# Patient Record
Sex: Male | Born: 1945
Health system: Southern US, Community
[De-identification: ages and names within clinical notes are randomized; demographics above are authoritative.]

## PROBLEM LIST (undated history)

## (undated) DIAGNOSIS — E871 Hypo-osmolality and hyponatremia: Secondary | ICD-10-CM

## (undated) DIAGNOSIS — IMO0001 Reserved for inherently not codable concepts without codable children: Secondary | ICD-10-CM

## (undated) DIAGNOSIS — E785 Hyperlipidemia, unspecified: Secondary | ICD-10-CM

## (undated) DIAGNOSIS — I10 Essential (primary) hypertension: Secondary | ICD-10-CM

## (undated) DIAGNOSIS — I2510 Atherosclerotic heart disease of native coronary artery without angina pectoris: Secondary | ICD-10-CM

## (undated) DIAGNOSIS — Z87442 Personal history of urinary calculi: Secondary | ICD-10-CM

## (undated) DIAGNOSIS — I672 Cerebral atherosclerosis: Secondary | ICD-10-CM

## (undated) DIAGNOSIS — I701 Atherosclerosis of renal artery: Secondary | ICD-10-CM

## (undated) DIAGNOSIS — R011 Cardiac murmur, unspecified: Secondary | ICD-10-CM

## (undated) DIAGNOSIS — J342 Deviated nasal septum: Secondary | ICD-10-CM

## (undated) DIAGNOSIS — M79661 Pain in right lower leg: Secondary | ICD-10-CM

## (undated) DIAGNOSIS — Z9289 Personal history of other medical treatment: Secondary | ICD-10-CM

## (undated) DIAGNOSIS — I739 Peripheral vascular disease, unspecified: Secondary | ICD-10-CM

## (undated) DIAGNOSIS — M79662 Pain in left lower leg: Secondary | ICD-10-CM

## (undated) DIAGNOSIS — K219 Gastro-esophageal reflux disease without esophagitis: Secondary | ICD-10-CM

## (undated) DIAGNOSIS — M1990 Unspecified osteoarthritis, unspecified site: Secondary | ICD-10-CM

## (undated) HISTORY — DX: Personal history of other medical treatment: Z92.89

## (undated) HISTORY — DX: Atherosclerosis of renal artery: I70.1

## (undated) HISTORY — PX: CYSTOSCOPY W/ STONE MANIPULATION: SHX1427

## (undated) HISTORY — DX: Reserved for inherently not codable concepts without codable children: IMO0001

## (undated) HISTORY — PX: TONSILLECTOMY: SUR1361

## (undated) HISTORY — PX: LITHOTRIPSY: SUR834

## (undated) HISTORY — DX: Peripheral vascular disease, unspecified: I73.9

## (undated) HISTORY — PX: APPENDECTOMY: SHX54

## (undated) HISTORY — DX: Pain in left lower leg: M79.662

## (undated) HISTORY — DX: Cerebral atherosclerosis: I67.2

## (undated) HISTORY — DX: Pain in right lower leg: M79.661

## (undated) HISTORY — DX: Hyperlipidemia, unspecified: E78.5

## (undated) HISTORY — PX: CAROTID ENDARTERECTOMY: SUR193

---

## 2001-11-24 ENCOUNTER — Ambulatory Visit (HOSPITAL_BASED_OUTPATIENT_CLINIC_OR_DEPARTMENT_OTHER): Admission: RE | Admit: 2001-11-24 | Discharge: 2001-11-24 | Payer: Self-pay | Admitting: *Deleted

## 2003-05-24 ENCOUNTER — Encounter: Payer: Self-pay | Admitting: General Practice

## 2003-05-24 ENCOUNTER — Ambulatory Visit (HOSPITAL_COMMUNITY): Admission: RE | Admit: 2003-05-24 | Discharge: 2003-05-24 | Payer: Self-pay | Admitting: General Practice

## 2005-08-02 DIAGNOSIS — Z9289 Personal history of other medical treatment: Secondary | ICD-10-CM

## 2005-08-02 HISTORY — DX: Personal history of other medical treatment: Z92.89

## 2005-08-16 ENCOUNTER — Ambulatory Visit (HOSPITAL_COMMUNITY): Admission: RE | Admit: 2005-08-16 | Discharge: 2005-08-16 | Payer: Self-pay | Admitting: Cardiovascular Disease

## 2005-10-23 IMAGING — CR DG CHEST 2V
2 series · 2 of 2 positions shown · non-contrast
Comparison: None.

CLINICAL DATA: Pre-admit for carotid stenosis. 
 CHEST - 2 VIEW:

[view not recorded (1 of 2)]
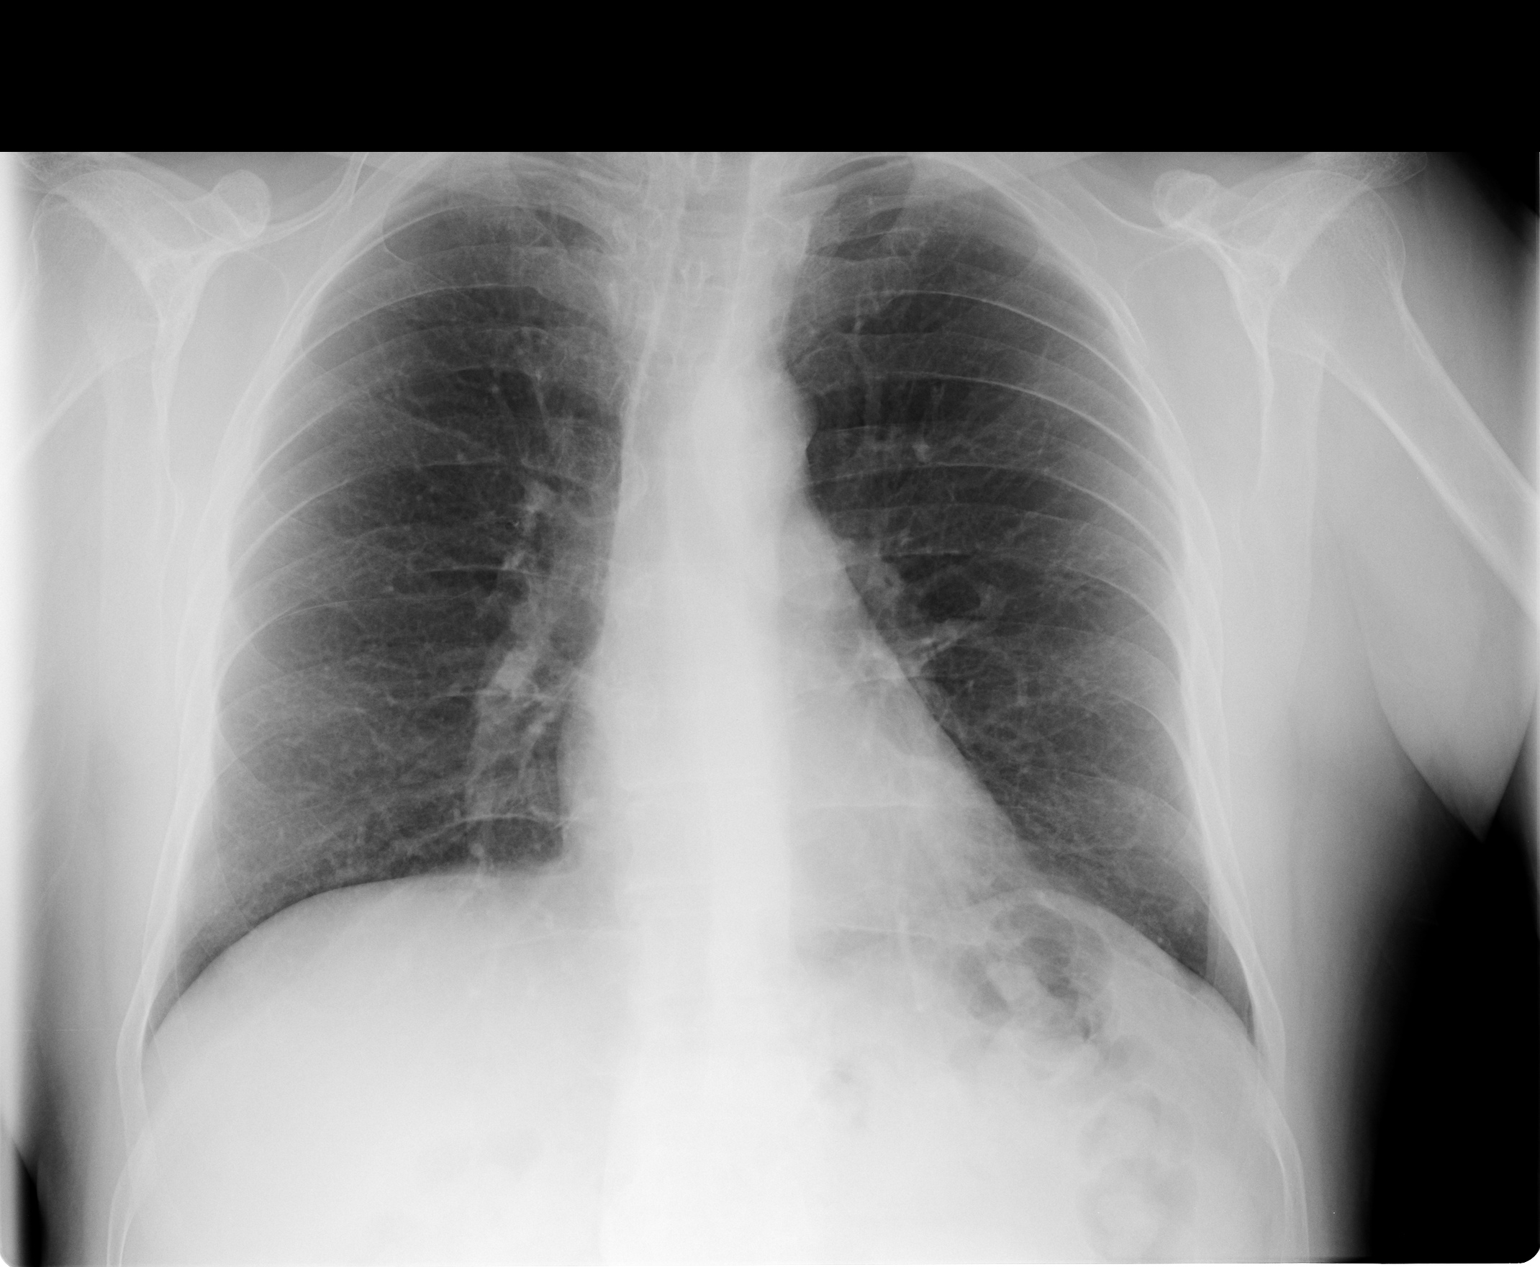

[view not recorded (2 of 2)]
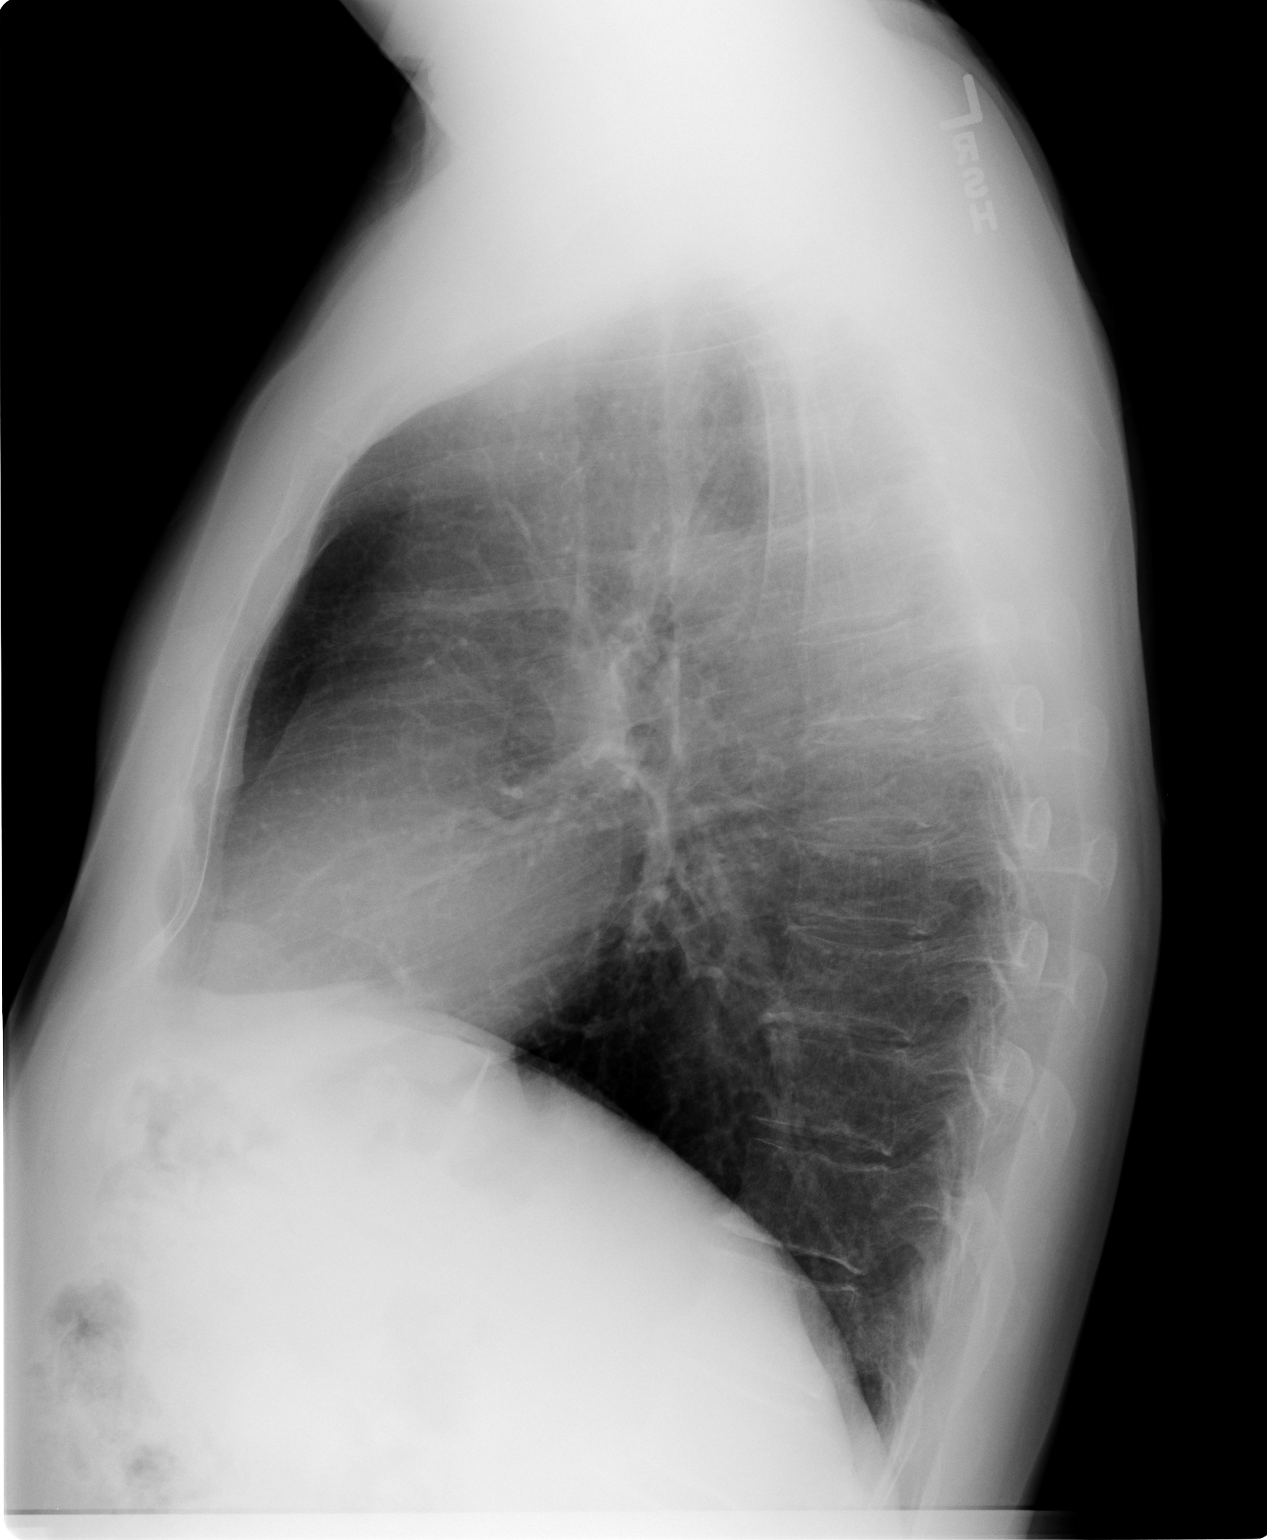

[2 of 2 positions shown; findings below may reference images not displayed]

FINDINGS: Heart and vascularity normal. Lung markings mildly accentuated and there may be mild hyperaeration.  No acute airspace disease.  Osseous structures intact.
IMPRESSION: There are mild chronic lung changes ? no acute process.

## 2005-10-25 ENCOUNTER — Encounter (INDEPENDENT_AMBULATORY_CARE_PROVIDER_SITE_OTHER): Payer: Self-pay | Admitting: *Deleted

## 2005-10-25 ENCOUNTER — Ambulatory Visit (HOSPITAL_COMMUNITY): Admission: RE | Admit: 2005-10-25 | Discharge: 2005-10-26 | Payer: Self-pay | Admitting: *Deleted

## 2005-12-21 IMAGING — CR DG CHEST 2V
2 series · 2 of 2 positions shown · non-contrast
Comparison: [DATE].

CLINICAL DATA: The patient is to undergo endarterectomy.  
 CHEST - 2 VIEW ? [DATE]:

[view not recorded (1 of 2)]
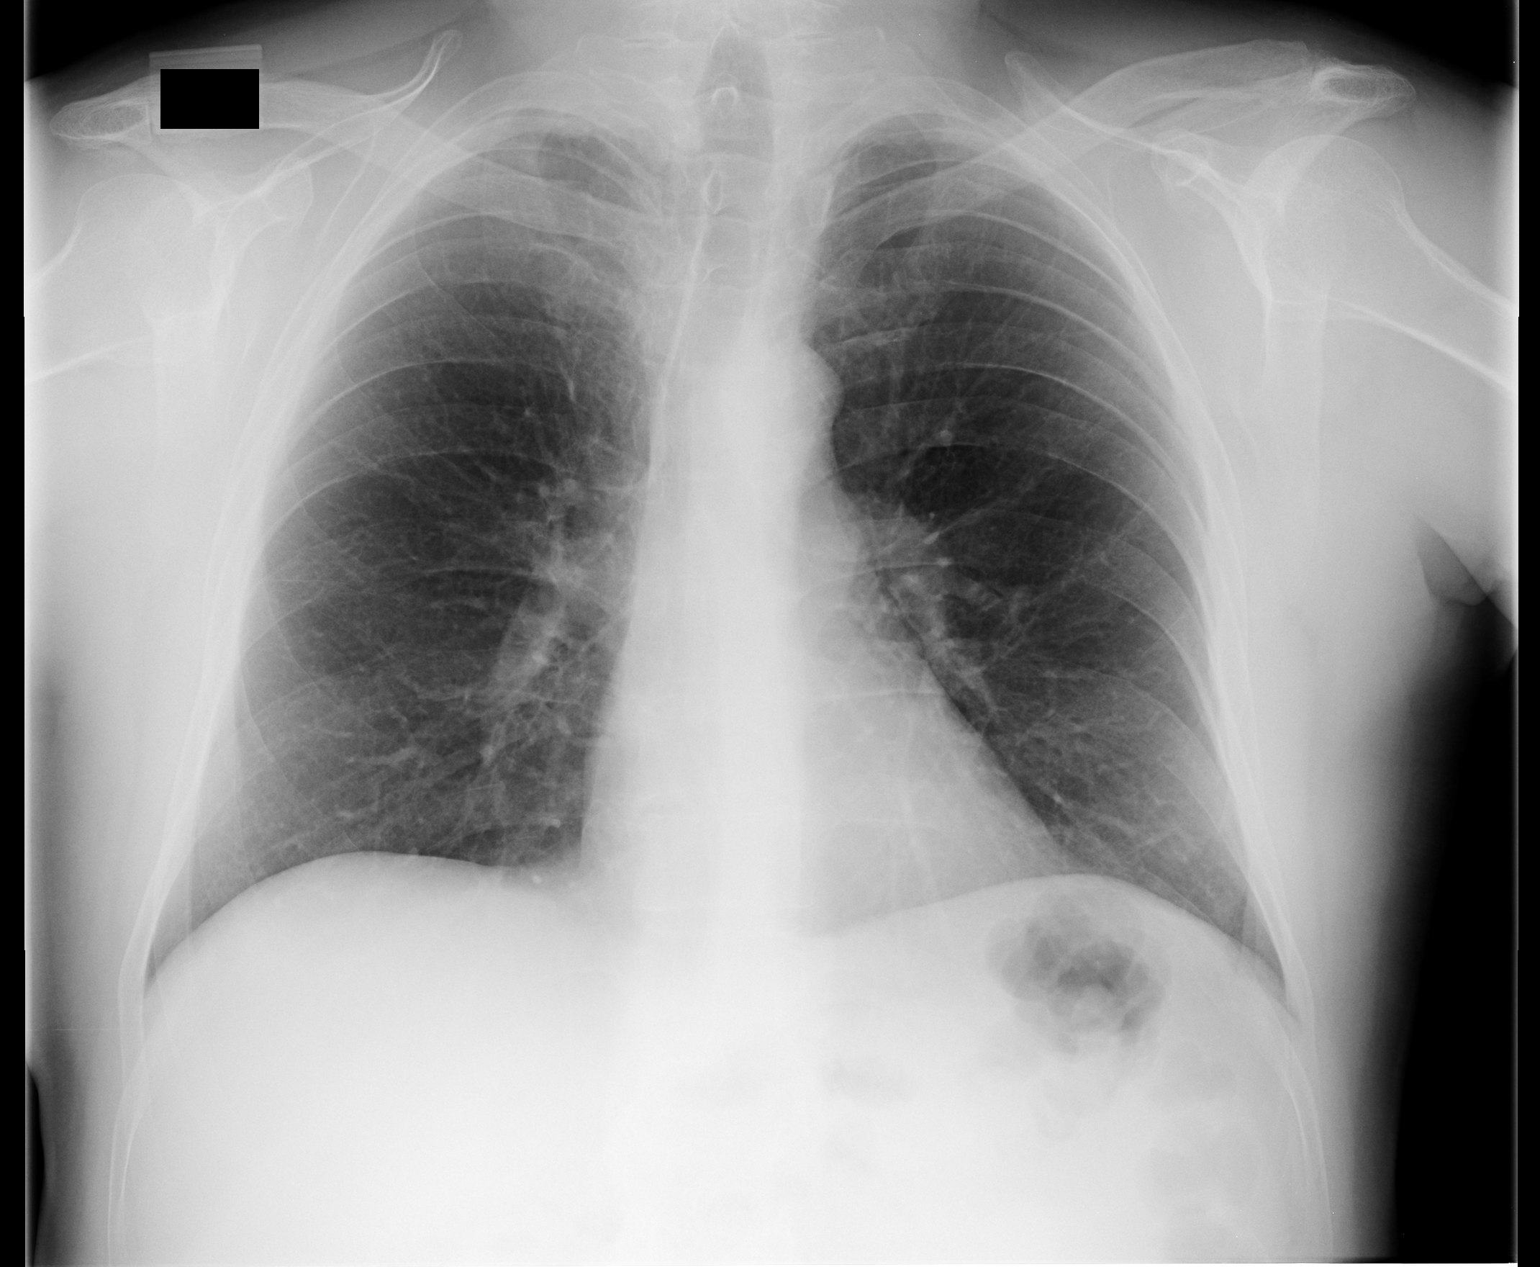

[view not recorded (2 of 2)]
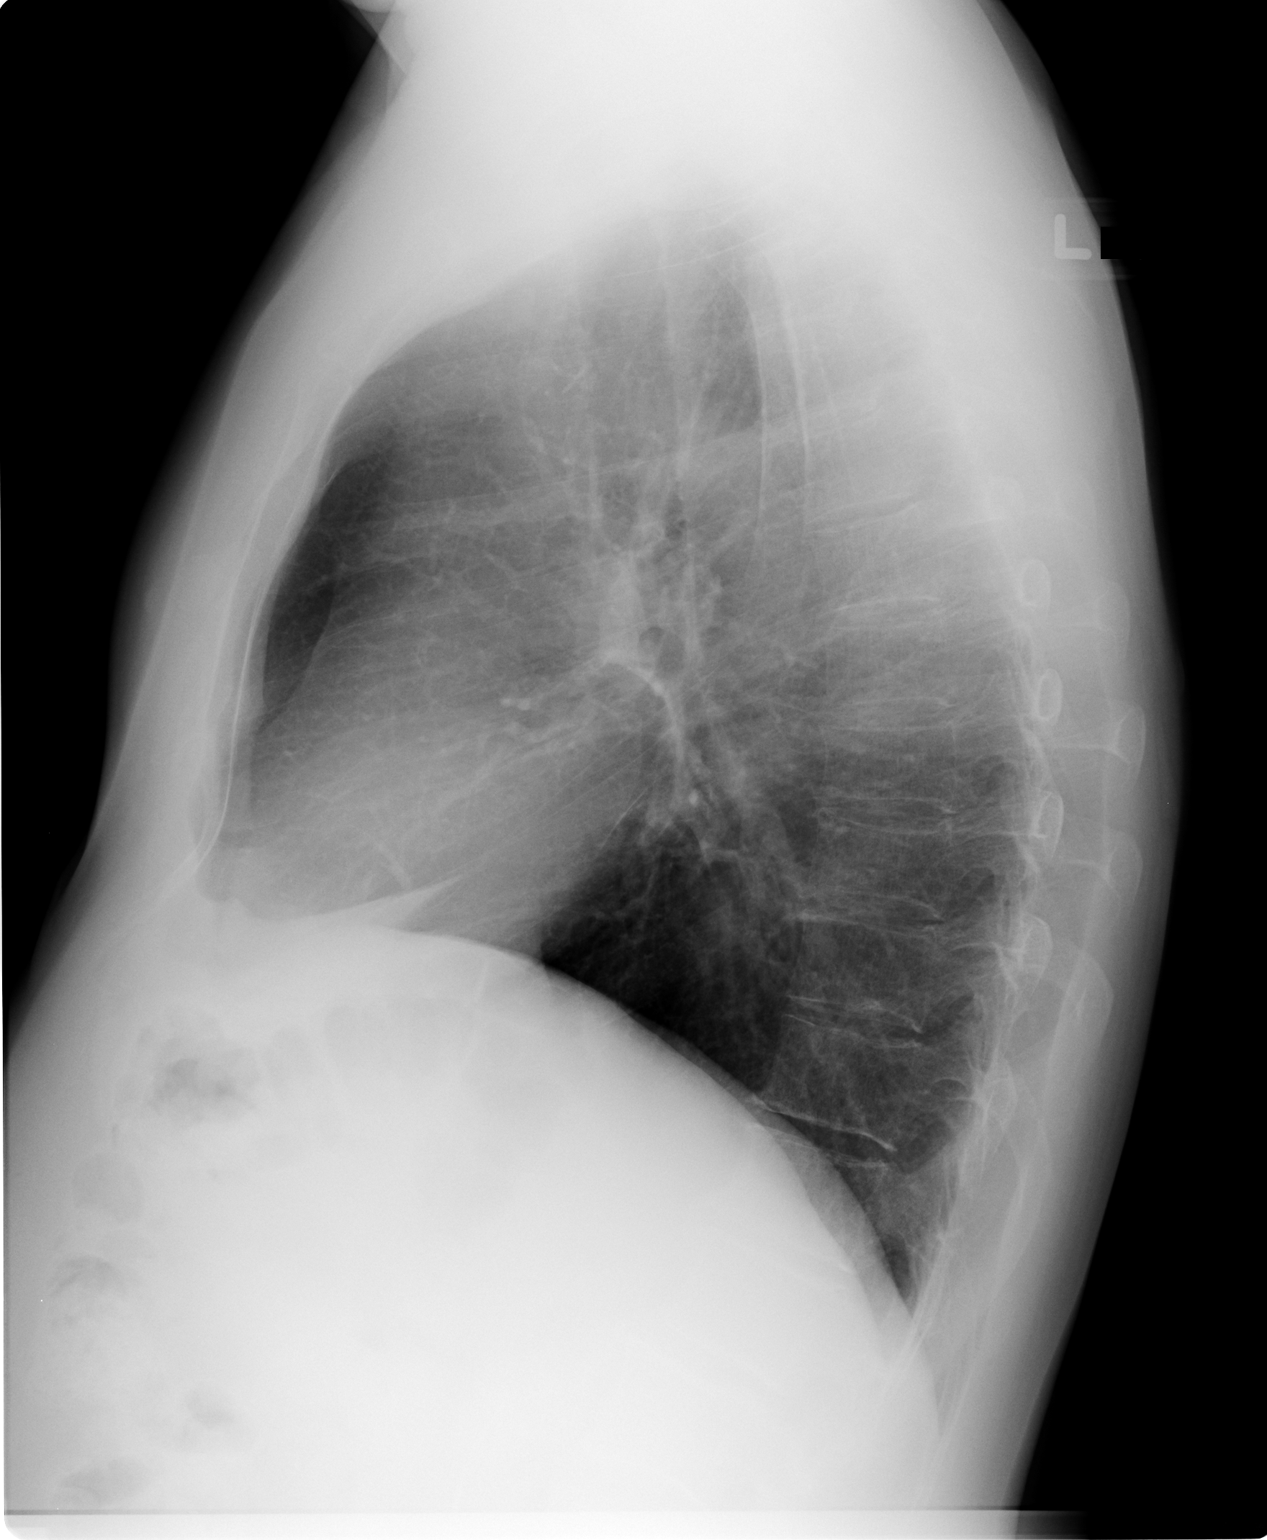

[2 of 2 positions shown; findings below may reference images not displayed]

The lungs are clear.  Cardiac and mediastinal contours are normal. Degenerative changes in the mid thoracic spine.
IMPRESSION: Negative for acute cardiac or pulmonary process.

## 2005-12-25 ENCOUNTER — Encounter (INDEPENDENT_AMBULATORY_CARE_PROVIDER_SITE_OTHER): Payer: Self-pay | Admitting: *Deleted

## 2005-12-25 ENCOUNTER — Ambulatory Visit (HOSPITAL_COMMUNITY): Admission: RE | Admit: 2005-12-25 | Discharge: 2005-12-26 | Payer: Self-pay | Admitting: *Deleted

## 2008-10-19 ENCOUNTER — Encounter: Admission: RE | Admit: 2008-10-19 | Discharge: 2008-10-19 | Payer: Self-pay | Admitting: Cardiovascular Disease

## 2008-10-19 IMAGING — CR DG CHEST 2V
2 series · 2 of 2 positions shown · non-contrast
Comparison: Chest x-ray of [DATE]

CLINICAL DATA: Peripheral vascular disease, pre-procedure

CHEST - 2 VIEW

[w chest pa]
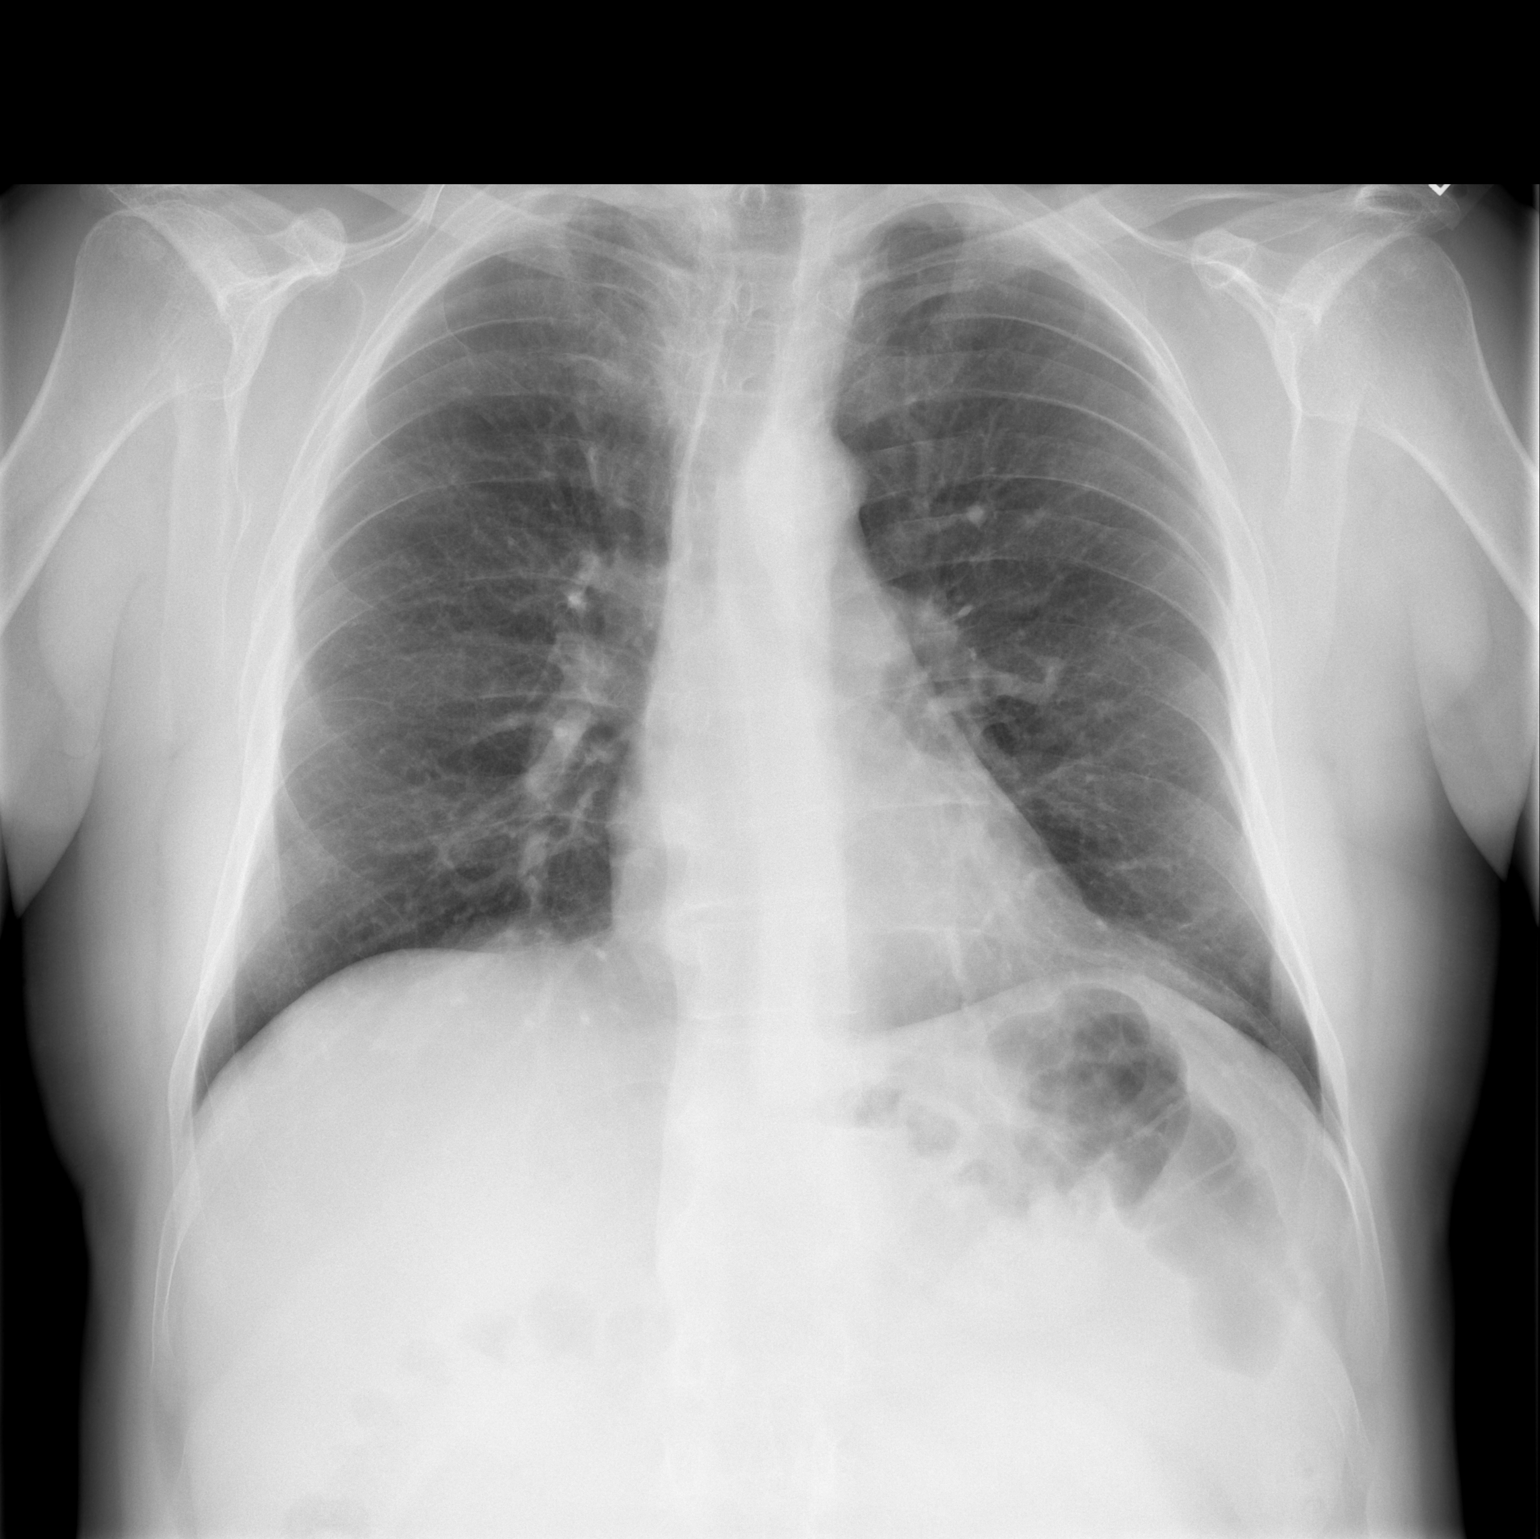

[w chest lat]
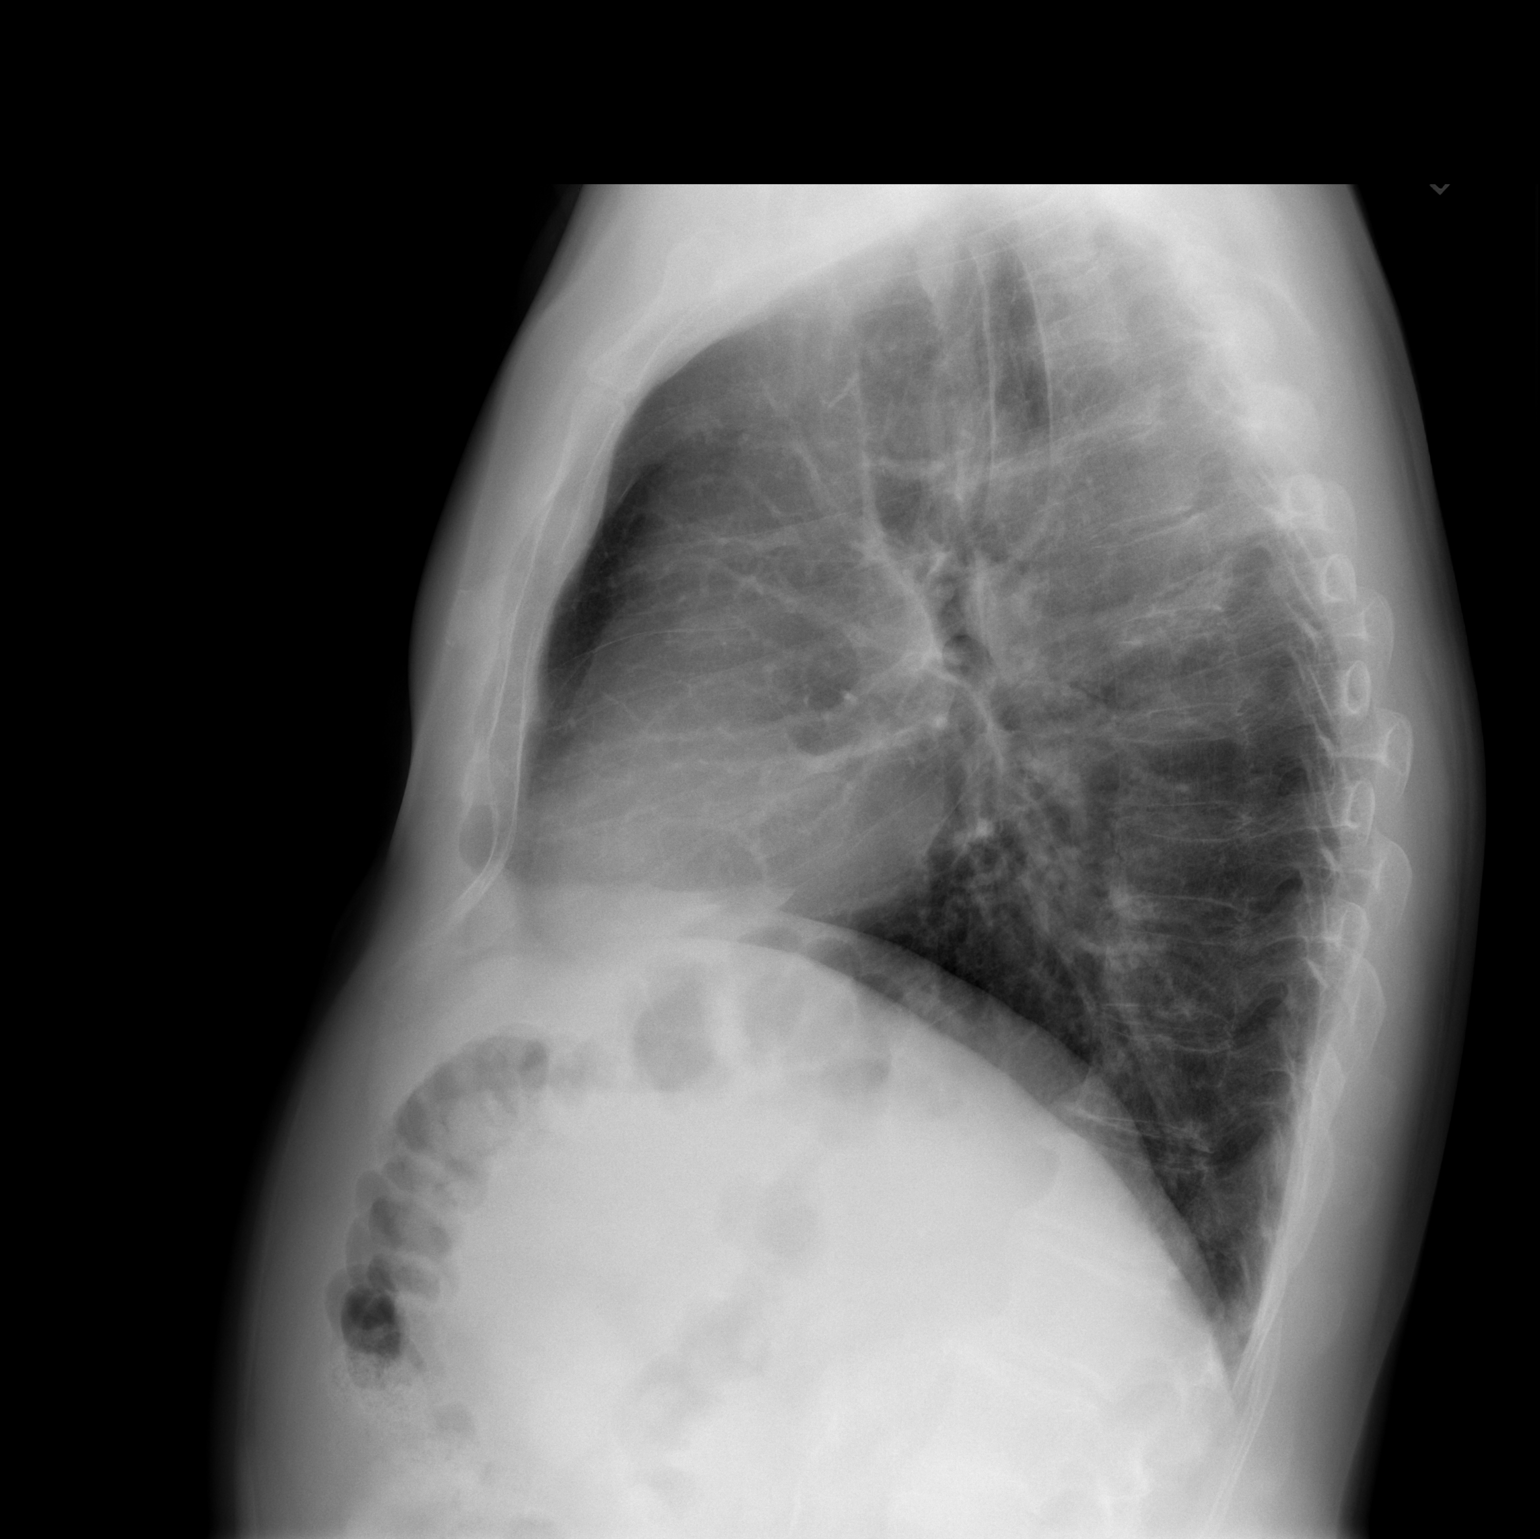

[2 of 2 positions shown; findings below may reference images not displayed]

FINDINGS: No active infiltrate or effusion is seen.  There is mild
peribronchial thickening present. The heart is within normal limits
in size. Mild degenerative change is noted in the thoracic spine.
IMPRESSION: No active lung disease.

## 2008-10-25 ENCOUNTER — Observation Stay (HOSPITAL_COMMUNITY): Admission: RE | Admit: 2008-10-25 | Discharge: 2008-10-26 | Payer: Self-pay | Admitting: Cardiovascular Disease

## 2008-10-25 DIAGNOSIS — I739 Peripheral vascular disease, unspecified: Secondary | ICD-10-CM

## 2008-10-25 HISTORY — DX: Peripheral vascular disease, unspecified: I73.9

## 2008-12-28 ENCOUNTER — Inpatient Hospital Stay (HOSPITAL_COMMUNITY): Admission: RE | Admit: 2008-12-28 | Discharge: 2008-12-29 | Payer: Self-pay | Admitting: Cardiovascular Disease

## 2011-03-26 LAB — CBC
HCT: 35.6 % — ABNORMAL LOW (ref 39.0–52.0)
Hemoglobin: 12 g/dL — ABNORMAL LOW (ref 13.0–17.0)
MCHC: 33.6 g/dL (ref 30.0–36.0)
MCV: 88.9 fL (ref 78.0–100.0)
Platelets: 241 10*3/uL (ref 150–400)
RBC: 4 MIL/uL — ABNORMAL LOW (ref 4.22–5.81)
RDW: 12.9 % (ref 11.5–15.5)
WBC: 9.5 10*3/uL (ref 4.0–10.5)

## 2011-03-26 LAB — BASIC METABOLIC PANEL
BUN: 19 mg/dL (ref 6–23)
CO2: 26 mEq/L (ref 19–32)
Calcium: 10.3 mg/dL (ref 8.4–10.5)
Chloride: 98 mEq/L (ref 96–112)
Creatinine, Ser: 1.45 mg/dL (ref 0.4–1.5)
GFR calc Af Amer: 59 mL/min — ABNORMAL LOW (ref >60)
GFR calc non Af Amer: 49 mL/min — ABNORMAL LOW (ref >60)
Glucose, Bld: 110 mg/dL — ABNORMAL HIGH (ref 70–99)
Potassium: 4.2 mEq/L (ref 3.5–5.1)
Sodium: 133 mEq/L — ABNORMAL LOW (ref 135–145)

## 2011-04-24 NOTE — Cardiovascular Report (Signed)
NAMEPRATT, BRESS NO.:  [id number]   MEDICAL RECORD NO.:  [id number]          PATIENT TYPE:  INP   LOCATION:  6524                         FACILITY:  [facility]   PHYSICIAN:  [name], M.D.   DATE OF BIRTH:  [birth year]   DATE OF PROCEDURE:  DATE OF DISCHARGE:                            CARDIAC CATHETERIZATION   Joshua Wyatt is a 65 year old mildly overweight married white male with history  of PAD status post bilateral carotid endarterectomies performed by Dr.  Drucie Opitz in January 2007 after an angiogram that I performed in  September 2006 revealed high-grade bilateral ICA stenosis.  At that  time, I also found a 60-70% left renal artery stenosis and moderate left  iliac disease.  The patient's other problems include ongoing tobacco  abuse with an occasional cigar, hypertension, and hyperlipidemia.  He  had negative Myoview in July with Dopplers performed in September  revealed widely patent carotids, high-grade bilateral renal artery  stenosis, high-grade iliac disease with an ABIs in the 0.85 range.  He  does have functional limiting claudication of his hip, buttock, and  calves.  He presents now for angiography and potential intervention.   PROCEDURE DESCRIPTION:  The patient was brought to the second floor of  [facility] in the postabsorptive state.  He was  premedicated with p.o. Valium, IV Versed and fentanyl.  His right groin  was prepped and shaved in the usual sterile fashion.  A 1% Xylocaine was  used for local anesthesia.  A 5-French sheath was inserted into the  right femoral artery using standard Seldinger technique.  A 5-French  tennis racket catheter was used for midstream and distal abdominal  aortography with bifemoral runoff using digital subtraction bolus chase  step-table technique.  A 5-French short right Judkins catheter was used  for selective right and left renal artery angiography.  Visipaque dye  was used through  the entirety of the case.  Retrograde aortic pressures  were monitored throughout the case.   ANGIOGRAPHIC RESULTS:  1. Abdominal aorta.      a.     Renal arteries - 80% right renal artery stenosis with an       eccentric plaque and 100 mm gradient on pullback.      b.     A 70% left renal artery stenosis with a 20 mm gradient.      c.     Moderate infrarenal and abdominal aortic atherosclerotic       narrowing.  2. Left lower extremity.      a.     A 30% left common iliac artery stenosis.      b.     An 80% distal left SFA stenosis in the Hunter canal with 3-       vessel runoff.  3. Right lower extremity.      a.     A 30% proximal right common iliac artery stenosis with a 20       mm gradient after administration of 200 mcg of intra-arterial       nitroglycerin.  b.     An 80% focal right SFA stenosis in the [place] canal with 3-       vessel runoff.   PROCEDURE DESCRIPTION:  The patient received 3000 units of heparin  intravenously.  Contralateral access was obtained with a 6-French  terminal crossover sheath.  A 0.035 Wholey wire was used to cross the  lesion.  A 3000 units of heparin was administered.  PTA was performed  with 4 x 4 Cordis Powerflex, stenting with a 6 x 6 Bard nitinol self-  expanding stent and post dilatation with a 5 x 4 Bard Rival angioplasty  balloon at 3 atmospheres resulting in reduction of an 80% distal left  SFA stenosis to 0% residual with excellent runoff.  The patient  tolerated the procedure well.  The guidewire and catheter were removed.  The sheath was removed after an ACT was measured.  Pressure was applied  on the groin to achieve hemostasis.  The patient left the lab in stable  condition.  I will gently hydrate him, discharge him in the morning,  treat with aspirin and Plavix, and will get followup Dopplers  and ABIs at which time he will see me back in the office in followup.  Because his blood pressure was under good control, his renal  function  was normal.  Intervention was not performed in the renal arteries.  This  will be followed closely by outpatient duplex.      [name], M.D.  Electronically Signed     JB/MEDQ  D:  10/25/2008  T:  10/25/2008  Job:  [id number]   cc:   Second Floor [place] [facility]  [facility]

## 2011-04-24 NOTE — Op Note (Signed)
NAMETRAI, ELLS NO.:  [id number]   MEDICAL RECORD NO.:  [id number]          PATIENT TYPE:  INP   LOCATION:  2030                         FACILITY:  [facility]   PHYSICIAN:  [name], M.D.   DATE OF BIRTH:  [birth year]   DATE OF PROCEDURE:  12/28/2008  DATE OF DISCHARGE:                               OPERATIVE REPORT   Joshua Wyatt is a 65 year old married white male with history of PVOD.  He  has had bilateral carotid endarterectomies after an angiogram from  September 2006 revealed high-grade disease.  He also has bilateral  iliac, cerebellar, renal and SFA disease.  I stented his left SFA on  November 16, resulting in improvement in his symptoms and ABIs.  He has  residual distal right SFA stenosis with claudication of that side.  Renal Dopplers showed a right renal aortic ratio of 7.8.  He presents  now for peripheral angiography intervention of his right renal and right  SFA.   DESCRIPTION OF PROCEDURE:  The patient was brought to the [facility] in the postabsorptive state.  He was  premedicated with p.o. Valium, IV fentanyl and Versed.  His left groin  was prepped and shaved in the usual sterile fashion.  Xylocaine 1% was  used for local anesthesia.  A 5-French sheath was inserted into the left  femoral artery using standard Seldinger technique.  A 5-French pigtail  catheter was used for abdominal aortography.  Visipaque dye was used for  the entirety of the case.  Retrograde aortic pressure was monitored  during the case.   ANGIOGRAPHIC RESULTS:  1. Abdominal aorta.      a.     Renal arteries, 90% right and 80% left renal artery       stenosis.      b.     Infrarenal abdominal aorta, moderate atherosclerotic       changes.      c.     Iliac bifurcation.  There was a 30-40% ostial right common       iliac artery stenosis.   Contralateral access was obtained with a short crossover catheter and an  0.035 Wholey  wire along with a 7-French Terumo crossover sheath.  The  patient received 3000 units of heparin intravenously.  Visipaque dye was  used for the entirety of the case.  Retrograde aortic pressure was  monitored during the case.  The [facility] wire was passed across the distal  right SFA stenosis and predilatation was performed with a 4.2 Powerflex.  Stenting was then performed with a 6 x 4 Bard Life nitinol self-  expanding stent and post dilatation was performed with a 6.2 long  Powerflex balloon resulting reduction of 90% distal right SFA stenosis  to 0% residual.   The crossover sheath was then withdrawn back across the bifurcation.  A  short 7-French sheath was exchanged.  Using a 6-French short JR-4 guide  catheter along with an 0.014 x 190 stabilizer wire, the right renal  artery was selected, angiogram performed, and the stenosis crossed.  Stenting was performed with a 6 x 15 Genesis on Aviator balloon stent  premount at 8 atmospheres resulting reduction of 90% stenosis to 0%  residual.  The patient tolerated the procedure well.  The guidewire and  catheter were removed.  The sheath was removed after an ACT was  measured less than 200.  Pressure was held in the groin to achieve  hemostasis.  The patient left the lab in stable condition.  He will be  gently hydrated overnight and discharged home in the morning.  He will  get followup Dopplers and ABIs.  We will see him back in the office in 2-  3 weeks for followup.      [name], M.D.  Electronically Signed     JB/MEDQ  D:  12/28/2008  T:  12/28/2008  Job:  [id number]   cc:   Second Floor [facility]  [facility]

## 2011-04-24 NOTE — Discharge Summary (Signed)
NAMELEMONT, SITZMANN NO.:  [id number]   MEDICAL RECORD NO.:  [id number]          PATIENT TYPE:  INP   LOCATION:  3212                         FACILITY:  [facility]   PHYSICIAN:  [name], M.D.   DATE OF BIRTH:  04-22-1946   DATE OF ADMISSION:  10/25/2008  DATE OF DISCHARGE:  10/26/2008                               DISCHARGE SUMMARY   DISCHARGE DIAGNOSES:  1. Claudication with abnormal ABIs.  2. Status post elective pulmonary vein angiogram revealing      a.     Bilateral renal artery stenoses 80% on the left, 70% on the       right.      b.     Bilateral superficial femoral artery disease.      c.     Status post intervention of his left superficial femoral       artery with a 6 x 6 LifeStent reduced from 80% to 0.      d.     History of bilateral carotid disease with carotid       endarterectomies in the past.      e.     Moderate atherosclerosis of his abdominal aorta.  3. Hypertension.  4. Hyperlipidemia.  5. Statin intolerance last trial dose of simvastatin unable to      tolerate.  6. Tobacco abuse.  7. Gastroesophageal reflux disease.   LABORATORY DATA:  Hemoglobin 13, hematocrit 37.7, WBCs 8.9 and platelets  214.  Sodium 132, potassium 4.2, BUN 15, and creatinine 1.13.   DISCHARGE MEDICATIONS:  1. Aspirin 81 mg a day.  2. Diltiazem XR 240 mg everyday.  3. Avalide 300/12.5 mg everyday.  4. Prilosec over-the-counter.  5. Plavix 75 mg once a day.   HOSPITAL COURSE:  Mr. Leavell was admitted on elective basis secondary to  lifestyle-limiting claudication and abnormal ABIs for elective PV  angiogram.  He underwent an angiogram by Dr. [name] on October 25, 2008.  Findings as above.  He underwent procedure with a stent to  his left SFA.  Post procedure, he did well.  The morning of October 26, 2008, he was seen by Dr. [name].  Vital signs were stable and  labs  were stable.  Blood pressure was 131/56, pulse was 54.  He was  considered stable for discharge home.  Other discharge instructions will  be he will have lower extremity Dopplers and kidney Dopplers on November 11, 2008, at 9:30.  He will follow up with Dr. Gwenlyn Found on November 18, 2008, at 11:30.      Cyndia Bent, N.P.      [name], M.D.  Electronically Signed    BB/MEDQ  D:  10/26/2008  T:  10/26/2008  Job:  [id number]

## 2011-04-24 NOTE — Discharge Summary (Signed)
NAMEMICAIAH, Joshua Wyatt NO.:  [id number]   MEDICAL RECORD NO.:  [id number]          PATIENT TYPE:  INP   LOCATION:  2030                         FACILITY:  [facility]   PHYSICIAN:  [name], M.D.   DATE OF BIRTH:  Jul 11, 1946   DATE OF ADMISSION:  12/28/2008  DATE OF DISCHARGE:  12/29/2008                               DISCHARGE SUMMARY   DISCHARGE DIAGNOSES:  1. Claudication, lifestyle-limiting of the right leg.  2. Atherosclerotic peripheral vascular disease with known 80% right      superficial femoral artery stenosis.      a.     Percutaneous transluminal angioplasty and stent deployment       to the right superficial femoral artery.      b.     Right renal artery stenosis of 90% as well as left renal       artery stenosis of 80% undergoing percutaneous transluminal       angioplasty and stent deployment shows right renal artery.  3. Hypertension.  4. Hyperlipidemia.   DISCHARGE CONDITION:  Improved.   PROCEDURES:  December 28, 2008, percutaneous transluminal angioplasty and  stent deployment with pulmonary vein angiogram to the right superficial  femoral artery.   December 28, 2008, with same pulmonary vein angiogram, the patient  underwent percutaneous transluminal angioplasty and stent deployment to  the right renal artery reducing 90% stenosis to 0%.   DISCHARGE MEDICATIONS:  1. Diltiazem XR 240 mg daily.  2. Avalide 300/12.5 one daily.  3. Prilosec OTC 20 mg daily.  4. Zocor 20 mg nightly.  5. Plavix 75 mg daily.  6. Aspirin 81 mg daily.   DISCHARGE INSTRUCTIONS:  1. Low-sodium heart-healthy diet.  Wash cath site with soap and water.      Call if any bleeding, swelling, or drainage.  Increase activity      slowly.  2. No lifting for 2 days.  No driving for 2 days.  Follow up with Dr.      Gwenlyn Found, Wednesday, January 12, 2009, at 11:30 a.m.  3. Have lab work done on Monday, January 03, 2009, to check basic      metabolic panel.  4. We will  schedule for lower extremity arterial and renal Dopplers on      January 06, 2009, at 1 p.m. at Dr. Kennon Holter office.   HISTORY OF PRESENT ILLNESS:  A 65 year old white married male with  history of PV angio and renal intervention with stenting of the left SFA  in November 2009.  He is also noted to have bilateral renal artery  stenosis with 80% right and 70% left on previous cath.  He was seen by  Dr. Gwenlyn Found in December 2009 and decision was made to undergo stenting to  the right renal artery and right SFA.  He has had claudication with  walking, unable to walk for exercise.  He had been walking one mile a  day prior to increasing claudication.   PAST MEDICAL HISTORY:  1. Peripheral vascular disease as stated.  2. Reflux disease.  3. Hyperlipidemia.  4. Hypertension.  5. He had a nuclear study, Myoview was negative for ischemia.  6. He has normal LV function by a 2D echo with normal valves.   ALLERGIES:  STATIN intolerance due to myalgias, but he is tolerating the  20 of Zocor.   Family history, social history, and review of systems, see H and P.   PHYSICAL EXAMINATION ON DISCHARGE:  Per Dr. Gwenlyn Found, blood pressure  134/80, pulse 62.  Left groin stable.  No hematoma and 2+ right pedal  pulse.  Creatinine was slightly elevated and I will follow up as an  outpatient.   LABORATORY DATA:  Hemoglobin 12 on the day of discharge, hematocrit  35.6, WBC 9.5, platelets 241.  Sodium 133, potassium 4.2, chloride 98,  CO2 of 26, BUN 19, creatinine 1.45, glucose 110, and calcium 10.3.  The  patient was stable after procedure, ambulated without problems, and was  felt was ready for discharge home.  He will follow up as an outpatient  after labs and renal Dopplers.      Otilio Carpen. Dorene Ar, N.P.      [name], M.D.  Electronically Signed    LRI/MEDQ  D:  12/29/2008  T:  12/29/2008  Job:  [id number]

## 2011-04-27 NOTE — H&P (Signed)
NAMESHARRON, SIMPSON                 ACCOUNT NO.:  [id number]   MEDICAL RECORD NO.:  [id number]          PATIENT TYPE:  AMB   LOCATION:  SDS                          FACILITY:  [facility]   PHYSICIAN:  Dorothea Glassman, M.D.    DATE OF BIRTH:  25-Mar-1946   DATE OF ADMISSION:  12/25/2005  DATE OF DISCHARGE:                                HISTORY & PHYSICAL   CARDIOLOGIST:  [name], M.D.   CHIEF COMPLAINT:  Right carotid artery stenosis.   HISTORY OF PRESENT ILLNESS:  This is a 65 year old Caucasian male with a  past medical history of extracranial cerebrovascular occlusive disease,  status post left carotid endarterectomy and heavy tobacco abuse.  The  patient was referred to Dr. Dorothea Glassman by Dr. Gwenlyn Found after a diagnostic  cerebral arteriography which revealed bilateral internal carotid artery  stenosis.  There is no history of stroke or transient ischemic attack.  The  patient denied any sensory, motor or visual changes, as well as speech or  swallowing problems.  Dr. Amedeo Plenty saw the patient on October 15, 2005, after  reviewing his arteriography and recommended that he undergo staged bilateral  carotid endarterectomies for a reduction of stroke.  The patient is right-  handed and therefore Dr. Amedeo Plenty recommended  the __________ carotid  endarterectomy first.  The patient had undergone a left carotid  endarterectomy with Dacron patch angioplasty on October 25, 2005.   The patient still remains asymptomatic.  He denies any headaches, nausea or  vomiting, vertigo, seizures, syncope, dysphagia, memory loss or TIA/CVA  symptoms.  The patient does continue to smoke a few cigarettes daily but is  trying to quit.   PAST MEDICAL HISTORY:  1.  Extracranial cerebrovascular occlusive disease with severe bilateral      internal carotid artery stenosis, status post left carotid      endarterectomy.  2.  Hypertension.  3.  Hyperlipidemia.  4.  History of nephrolithiasis.  5.  History of skin  cancer, status removal on October 22, 2005.  6.  History of tobacco abuse.   PAST SURGICAL HISTORY:  1.  Left carotid endarterectomy with Dacron patch angioplasty on October 25, 2005.  2.  T&A.   ALLERGIES:  No known drug allergies.   MEDICATIONS:  1.  Aspirin 325 mg p.o. daily.  2.  Prilosec 20 mg p.o. daily.  3.  Avapro 150 mg p.o. daily.   REVIEW OF SYSTEMS:  See the HPI for pertinent positives and negatives,  though is negative for stroke, syncope and diabetes mellitus.   SOCIAL HISTORY:  The patient is married, without any children.  He lives  with his wife.  The patient is currently smoking and trying to quit.  The  patient denies any alcohol use.  The patient is self-employed as a  Games developer.  He does drive.   FAMILY HISTORY:  Mother is still living, in her 40's.  She has a history of  a CVA and Alzheimer's disease.  The patient's father is deceased at age 40,  due to coronary artery  disease, status post coronary artery bypass graft  surgery.  The patient has a brother with a past medical history of  hypertension.   PHYSICAL EXAMINATION:  VITAL SIGNS:  Blood pressure 160/100.  The blood  pressure was reassessed in five minutes and decreased to 148/98.  Heart rate  90, respirations 16.  GENERAL:  This is a 65 year old Caucasian male, in no acute distress.  HEENT:  Normocephalic and atraumatic.  Pupils equal, round, reactive to  light and accommodation.  Extraocular movements intact.  Oral mucosa is pink  and moist.  Sclerae anicteric.  NECK:  Supple.  There is a noticeable right carotid bruit.  No left carotid  bruit heard.  Left carotid incision healed well.  LUNGS:  Respirations symmetrical on inspiration.  Clear to auscultation  bilaterally.  HEART:  A regular rate and rhythm.  No murmur, gallop or rub.  ABDOMEN:  Soft, nontender, non-distended.  Normoactive bowel sounds x4.  GENITOURINARY:  Deferred.  RECTAL:  Deferred.  EXTREMITIES:  No edema.  Pulses 2+  throughout.  NEUROLOGIC:  Nonfocal.  Alert and oriented x4.  A steady gait.  Muscle  strength 5+ throughout.  Cranial nerves are grossly intact.  Deep tendon  reflexes 2+ and symmetrical.   ASSESSMENT:  1.  Extracranial cerebrovascular occlusive disease with bilateral internal      carotid artery stenosis, status post left carotid endarterectomy.  2.  Hyperlipidemia.  3.  History of tobacco abuse.   PLAN:  1.  Admit the patient to [facility] on December 25, 2005,      under Dr. Drucie Opitz' service.  2.  The patient will undergo a __________ carotid endarterectomy.  3.  Dr. Amedeo Plenty has seen and evaluated the patient prior to admission and      explained the risks and benefits of the procedure in great detail.  The      patient has agreed to continue.      Richardson Dopp, PA      P. Drucie Opitz, M.D.  Electronically Signed    JMW/MEDQ  D:  12/21/2005  T:  12/22/2005  Job:  [id number]   cc:   [name], M.D.  Fax: [phone]

## 2011-04-27 NOTE — Cardiovascular Report (Signed)
NAMEJARAE, PANAS NO.:  [id number]   MEDICAL RECORD NO.:  [id number]          PATIENT TYPE:  AMB   LOCATION:  SDS                          FACILITY:  [facility]   PHYSICIAN:  [name], M.D.   DATE OF BIRTH:  09-25-1946   DATE OF PROCEDURE:  08/16/2005  DATE OF DISCHARGE:                              CARDIAC CATHETERIZATION   PROCEDURE:  Cerebral angiogram and abdominal aortogram.   Mr. [name] is a 65 year old married white male who is referred by Dr. Rosina Lowenstein  for evaluation of abnormal carotid duplex study with symptoms of dizziness.  Carotid duplex revealed high-grade bilateral ICA stenosis. The patient's  risk factor profile was positive for hypertension, hyperlipidemia, tobacco  abuse, and family history. He complained of exertional chest pain. A  Cardiolite stress test was entirely normal as was lower extremity Doppler  studies and 2-D echo. He presents now for cerebral angiography to define his  anatomy.   PROCEDURE DESCRIPTION:  The patient was brought to the 6th floor [place]  peripheral vascular angiographic suite in the __________ state. He was not  premedicated. His right groin was prepped and draped in the usual sterile  fashion. Then 1% Xylocaine was used for local anesthesia. A 5-french sheath  was inserted into the right femoral artery using standard Seldinger  technique. A 5-French long pigtail catheter was used for arch angiography  and distal abdominal aortography. A JV-1 catheter was used for four vessel  cerebral angiography, intra- and extracranial views. Visipaque was used  throughout the entire case. Retrograde aortic pressures were monitored  throughout the case.   ANGIOGRAPHIC RESULTS:  1.  Arch aortogram: Type I arch.  2.  A. Right vertebral: Normal intra- and extracranial anatomy.      1.  Right carotid 70% to 80% proximal ICA stenosis with normal intra-          and extracranial carotid anatomy otherwise.      2.  Left carotid  70% to 80% ulcerated appearing proximal left ICA          stenosis with normal intra- and extracranial anatomy.      3.  Left vertebral: Normal intra- and extracranial anatomy.   DISTAL ABDOMINAL AORTOGRAPHY:  Performed using 20 mL of Visipaque dye at 20  mL per second.  There was a proximal 60% to 70% left renal artery stenosis.  There was moderate atherosclerotic changes of the inferior abdominal aorta  and a 70% distal left common iliac artery stenosis.   IMPRESSION:  Mr. [name] has high-grade bilateral internal carotid artery  stenosis. He will be discharged home today as an outpatient and will see  back in the office next week. Will refer him to Dr. Drucie Opitz and Dr.  Antony Contras for further evaluation to determine whether he is a capture II carotid  stent candidate given high-grade symptomatic versus an endarterectomy  candidate.   Sheaths were removed and pressure held to groin to achieve hemostasis. The  patient left the lab in stable condition.      [name], M.D.  Electronically Signed  JB/MEDQ  D:  08/16/2005  T:  08/16/2005  Job:  [id number]   cc:   [facility]  [place], [place] [address]   Lajean Saver, M.D.

## 2011-04-27 NOTE — Op Note (Signed)
[place]  Patient:    Joshua Wyatt, Joshua Wyatt Visit Number: [id number] MRN: [id number]          Service Type: DSU Location: [facility] Attending Physician:  Carlisle Cater Dictated by:   Daryl Eastern, M.D. Proc. Date: 11/24/01 Admit Date:  11/24/2001                             Operative Report  PREOPERATIVE DIAGNOSIS:  Lacerated extensor tendon dorsum of the right hand to the right index finger.  POSTOPERATIVE DIAGNOSIS:  Lacerated extensor tendon dorsum of the right hand to the right index finger.  PROCEDURE:  Repair of extensor tendon right hand to the right index finger.  SURGEON:  Daryl Eastern, M.D.  ANESTHESIA:  Marcaine 0.5% local.  OPERATIVE FINDINGS:  The patient had a 90% oblique jagged laceration of the EDC of the right index finger.  It was lacerated just through the distal portion of the metacarpus.  DESCRIPTION OF PROCEDURE:  Under 0.50% Marcaine local anesthesia with a tourniquet on the right forearm, the right hand was prepped and draped in the usual fashion, and after elevating the limb the tourniquet was inflated to 280 mmHg.  The previous sutures were removed, and the laceration was extended proximally in a zigzag fashion.  Blunt dissection was carried down to the tendon which was 90% transected.  The wound edges were tidy up with the scissors on the original laceration.  Bleeding veins were controlled with 4-0 chromic ties.  The extensor tendon was then repaired with a 4-0 Mersilene suture in a Kessler fashion, reinforced with interrupted sutures.  The skin was then closed with 4-0 nylon sutures.  Sterile dressings were applied, followed by a volar plaster splint.  The tourniquet was released with good circulation to the hand.  The patient was discharged in good condition. Dictated by:   Daryl Eastern, M.D. Attending Physician:  Carlisle Cater DD:  11/24/01 TD:   11/24/01 Job: [id number]

## 2011-04-27 NOTE — Op Note (Signed)
NAMEESTEVEN, OVERFELT NO.:  [id number]   MEDICAL RECORD NO.:  [id number]          PATIENT TYPE:  INP   LOCATION:  3301                         FACILITY:  [place]   PHYSICIAN:  Dorothea Glassman, M.D.    DATE OF BIRTH:  02/28/1946   DATE OF PROCEDURE:  12/25/2005  DATE OF DISCHARGE:                                 OPERATIVE REPORT   SURGEON:  P Cameron Sprang, MD   ASSISTANT:  Doroteo Bradford, PA.   ANESTHETIC:  General endotracheal.   ANESTHESIOLOGIST:  Dr. Ola Spurr.   PREOPERATIVE DIAGNOSIS:  Severe right internal carotid artery stenosis.   POSTOPERATIVE DIAGNOSIS:  Severe right internal carotid artery stenosis.   PROCEDURE:  Right carotid endarterectomy, Dacron patch angioplasty.   CLINICAL NOTE:  [name] is a 65 year old male who has previously  undergone left carotid endarterectomy. He has known severe right internal  carotid artery stenosis. He is asymptomatic. Brought to the operating at  this time for elective right carotid endarterectomy. Risks of the operative  procedure explained to the patient in detail. Major morbidity and mortality  associated this procedure 1-2% to include but not limited to MI, CVA,  cranial nerve injury and death.   OPERATIVE PROCEDURE:  The patient brought to the operating room in stable  condition. Placed under general endotracheal anesthesia. The right neck  prepped and draped in sterile fashion. Foley catheter and arterial line in  place.   Curvilinear skin incision made along the anterior border of the right  sternomastoid muscle. Dissection carried down through subcutaneous tissue  with electrocautery. Platysma divided. Deep dissection carried along the  upper border of sternomastoid to expose the carotid sheath. The facial vein  ligated with 2-0 silk and divided. The common carotid artery mobilized down  the omohyoid muscle encircled with vessel loop. The vagus nerve reflected  posteriorly and preserved. The  origin of superior thyroid external carotid  were encircled with vessel loops. The internal carotid artery then followed  distally up to the posterior belly of the digastric muscle. Encircled  distally with vessel loop.  The hypoglossal nerve reflected superiorly and  preserved.   The carotid bifurcation revealed extensive plaque disease. There was a long  segment of space-occupying plaque extending into the internal carotid artery  approximately 3 cm.   The patient administered 7000 units heparin intravenously. Adequate  circulation time permitted. Vessels controlled with clamps. Longitudinal  arteriotomy made in the distal common carotid artery. The arteriotomy  extended across the carotid bulb and up into the internal carotid artery.  There was ulcerated plaque present in the right internal carotid artery with  an extensive plaque extending into the origin of the vessel. A high-grade  stenosis present. Shunt was inserted.   Using an endarterectomy elevator. The plaque was removed from the vessel.  The endarterectomy carried down in the common carotid artery with plaque was  divided transversely Potts scissors. Plaque raised up to the bulb and  superior thyroid and external carotid were endarterectomized using an  eversion technique. The distal internal carotid artery plaque then feathered  out.  Fragments of plaque removed with plaque forceps. Site irrigated with  heparin saline solution and Dextran.   A patch angioplasty of the endarterectomy site then carried out using  running 6-0 Prolene suture with a Finesse Dacron patch. At completion of the  patch angioplasty, shunt was removed. All vessels well flushed. Clamps  removed directing the initial antegrade flow up the external carotid artery,  following this internal carotid was released.   There is an excellent pulse and Doppler signal in the distal internal  carotid artery. Adequate hemostasis obtained. The patient administered  50  milligrams protamine intravenously.   The sternomastoid fascia was then closed with running 2-0 Vicryl suture.  Platysma closed with running 3-0 Vicryl suture. Skin closed with 4-0  Monocryl. Steri-Strips applied.   The patient tolerated procedure well. Transferred recovery in stable  condition. No apparent complications.      Dorothea Glassman, M.D.  Electronically Signed     PGH/MEDQ  D:  12/25/2005  T:  12/26/2005  Job:  [id number]   cc:   [name], M.D.  Fax: [phone]

## 2011-04-27 NOTE — Discharge Summary (Signed)
Joshua Wyatt, BOGDANSKI NO.:  [id number]   MEDICAL RECORD NO.:  [id number]          PATIENT TYPE:  INP   LOCATION:  3301                         FACILITY:  [place]   PHYSICIAN:  Dorothea Glassman, M.D.    DATE OF BIRTH:  10-25-1946   DATE OF ADMISSION:  12/25/2005  DATE OF DISCHARGE:  12/26/2005                                 DISCHARGE SUMMARY   HISTORY OF PRESENT ILLNESS:  This is a 65 year old Caucasian male with a  past medical history of extracranial cerebrovascular occlusive disease  status post recent left carotid endarterectomy and heavy tobacco abuse.  The  patient was referred to Dr. Amedeo Plenty by Dr. Gwenlyn Found after a diagnostic cerebral  arteriogram revealed bilateral internal carotid artery stenosis.  There is  no history of stroke or transient ischemic attack.  The patient has remained  asymptomatic following his procedure done in November 2006 on the left side.  The patient was admitted for the second carotid endarterectomy and the  planned staged procedures.   PAST MEDICAL HISTORY:  1.  Extracranial cerebrovascular occlusive disease with bilateral internal      carotid artery stenosis status post left carotid endarterectomy.  2.  Hypertension.  3.  Hyperlipidemia.  4.  History of nephrolithiasis.  5.  History of skin cancer.  6.  History of tobacco abuse.   PAST SURGICAL HISTORY:  1.  Left carotid endarterectomy October 25, 2005.  2.  Tonsillectomy and adenoidectomy.   ALLERGIES:  No known allergies.   MEDICATIONS PRIOR TO ADMISSION:  1.  Aspirin 325 mg daily.  2.  Prilosec 20 mg daily.  3.  Avapro 150 mg daily.   FAMILY HISTORY:  Please see history and physical done at the time of  admission.   SOCIAL HISTORY:  Please see history and physical done at the time of  admission.   REVIEW OF SYSTEMS:  Please see history and physical done at the time of  admission.   PHYSICAL EXAMINATION:  Please see history and physical done at the time of  admission.   HOSPITAL COURSE:  Patient was admitted electively on December 25, 2005, taken  to the operating room at which time he underwent a right carotid  endarterectomy with Dacron patch angioplasty.  Patient tolerated the  procedure well.  Was taken to the postanesthesia care unit in stable  condition.   POSTOPERATIVE HOSPITAL COURSE:  Patient has done quite well.  He has  remained hemodynamically stable.  All routine lines, monitors, and drainage  devices have been discontinued in the standard fashion.  Incision is healing  well without hematoma, bleeding, or evidence of infection.  He is  neurologically intact with no new findings postoperatively.  He has  tolerated a routine progression in activity commensurate for level of  postoperative convalescence using routine protocols.  His overall status is  felt to be stable for discharge on December 26, 2005.   CONDITION ON DISCHARGE:  Stable, improving.   DISCHARGE MEDICATIONS:  As preoperatively.  Additionally for pain, Tylox one  or two every four to six hours as needed.  INSTRUCTIONS:  The patient will receive written instructions in regard to  medications, activity, diet, wound care, and follow-up.  Follow-up will  include Dr. Amedeo Plenty in two weeks.  The office will contact him with an  appointment time and date.   FINAL DIAGNOSES:  Severe bilateral carotid disease as described above now  status post second (right carotid endarterectomy) in the planned staged  bilateral procedures.  Other diagnoses as previously listed per the history.   LABORATORIES:  Most recent hemoglobin and hematocrit postoperatively were 10  and 29, respectively.  Electrolytes were pending at time of discharge  dictation.      John Giovanni, P.A.-C.      Dorothea Glassman, M.D.  Electronically Signed    WEG/MEDQ  D:  12/26/2005  T:  12/26/2005  Job:  [id number]

## 2011-04-27 NOTE — Discharge Summary (Signed)
NAMEAIME, MELOCHE                 ACCOUNT NO.:  [id number]   MEDICAL RECORD NO.:  [id number]          PATIENT TYPE:  OIB   LOCATION:  1700                         FACILITY:  [facility]   PHYSICIAN:  Dorothea Glassman, M.D.    DATE OF BIRTH:  May 16, 1946   DATE OF ADMISSION:  10/25/2005  DATE OF DISCHARGE:  10/26/2005                                 DISCHARGE SUMMARY   ADMISSION DIAGNOSIS:  Extracranial cerebrovascular occlusive disease with  bilateral internal carotid artery stenosis.   DISCHARGE SECONDARY DIAGNOSES:  1.  Extracranial cerebrovascular occlusive disease with bilateral internal      carotid artery stenosis, status post left carotid endarterectomy.  2.  Hypertension.  3.  Hyperlipidemia.  4.  History of appendectomy and history of tonsillectomy.  5.  Nephrolithiasis.  6.  Recent cancer on the left face, status post removal, October 22, 2005.  7.  History of tobacco abuse, recently discontinued.   ALLERGIES:  No known drug allergies.   BRIEF HISTORY:  Joshua Wyatt is a 65 year old Caucasian male with a history of  tobacco abuse referred to Dr. Amedeo Plenty by Dr. Gwenlyn Found after diagnostic cerebral  arteriography revealed bilateral internal carotid artery stenosis. There is  no history of stroke or transient ischemic attack. He specifically denied  any sensory, motor, or visual changes as well as speech or swallowing  problems. He saw Dr. Amedeo Plenty in consultation in the [place] office on October 15, 2005. After review of his arteriography, Dr. Amedeo Plenty did recommend that he  undergo staged bilateral carotid endarterectomies for reduction of stroke  risk. The patient is right-handed and therefore Dr. Amedeo Plenty recommended doing  the left carotid endarterectomy first. We discussed the risks, benefits, and  alternatives, and the patient agreed to proceed.   PROCEDURE:  Left carotid endarterectomy with Dacron patch angioplasty by Dr.  Amedeo Plenty on October 25, 2005.   HOSPITAL COURSE:  On October 25, 2005, Mr. Knack was electively admitted to  [facility] and undergo left carotid endarterectomy with Dacron  patch angioplasty. There were no known intraoperative complications and he  was extubated neurologically intact. After short stay in the recovery unit  he was transferred to the stepdown unit, 3300, with routine carotid  endarterectomy postoperative orders. He remained overnight and on  postoperative day one vitals showed blood pressure 120/55, heart rate in the  70s and 80s, in a sinus rhythm, he was afebrile and saturating 97% on room  air. His urine output was adequate and labs were also stable showing a white  blood cell count of 8.6, hemoglobin 8.10, hematocrit 31.1, platelet count  193,000. Sodium 137, potassium 4.0, BUN 9, creatinine 0.8, and blood glucose  113.  Subjectively, he had some mild incisional pain, but was overall  comfortable. He denies dysphagia. He also experienced some mild nausea  overnight, but this had improved. He was also passing flatus. On exam, heart  had a regular rate and rhythm. Lungs were clear. Abdominal exam was benign  with good bowel sounds. Neurologically, he was intact. His tongue was  midline. His incision was clean,  dry, and intact without evidence of  hematoma. By that time his Foley catheter and arterial line had both been  discontinued. It was felt that if he was able to tolerate breakfast,  ambulate without difficulty, and void following Foley catheter removal that  he would be stable for discharge home on postoperative day one on October 26, 2005.   DISCHARGE MEDICATIONS:  He is to resume his home medications including  aspirin 325 mg daily, Prilosec 20 mg daily, Avalide 150/12.5 mg daily, given  a prescription for Tylox one to two tablets p.o. q.4-6h. p.r.n. pain.   DISCHARGE INSTRUCTIONS:  He is instructed to continue a low-fat, low-salt  diet. He may shower starting on October 27, 2005. He should avoid lotions  directly  on incision site, but may use soap and water to clean his incisions  gently. He should notify the CVTS office he develops fever greater than 101  or redness or drainage from incision sites. He should avoid driving or heavy  lifting for the next two weeks. He is to follow up with Dr. Amedeo Plenty at the  [place] office on Monday, November 05, 2005. He will be  instructed on a  specific time prior to discharge.      Jacinta Shoe, P.A.      Dorothea Glassman, M.D.  Electronically Signed    AWZ/MEDQ  D:  10/26/2005  T:  10/26/2005  Job:  [id number]   cc:   Dorothea Glassman, M.D.  [address]  [place]  [place] [address]   Aron Baba, M.D.   [name], M.D.  Fax: [phone]

## 2011-04-27 NOTE — Op Note (Signed)
[name], Joshua NO.:  [id number]   MEDICAL RECORD NO.:  [id number]          PATIENT TYPE:  INP   LOCATION:  3244                         FACILITY:  [place]   PHYSICIAN:  Joshua Wyatt, M.D.    DATE OF BIRTH:  June 13, 1946   DATE OF PROCEDURE:  10/25/2005  DATE OF DISCHARGE:  10/26/2005                                 OPERATIVE REPORT   SURGEON:  Gordy Clement, M.D.   ASSISTANT:  John Giovanni, P.A.-C.   ANESTHETIC:  General endotracheal.   ANESTHESIOLOGIST:  Soledad Gerlach, M.D.,   PREOPERATIVE DIAGNOSIS:  Severe bilateral internal carotid artery stenosis.   POSTOPERATIVE DIAGNOSIS:  Severe bilateral internal carotid artery stenosis.   PROCEDURE:  Left carotid endarterectomy, Dacron patch angioplasty.   CLINICAL NOTE:  [name] is a 65 year old male who has previously a  undergone workup for his extracranial cerebrovascular occlusive disease.  By  Doppler evaluation he was found to have bilateral high-grade internal  carotid artery stenoses.  He underwent arteriography, which verifies these  findings.  He has had a Cardiolite stress test, which was normal.  He is to  undergo a left carotid endarterectomy at this time for reduction of stroke  risk.  The patient understands risks this procedure with a major morbidity  and mortality rate of 1-2% to include but not limited to MI, CVA, cranial  nerve injury and death.   OPERATIVE PROCEDURE:  The patient brought to the operating room in stable  hemodynamic condition.  Placed in supine position.  General endotracheal  anesthesia induced.  Foley catheter and arterial line in place.  Left neck  prepped and draped in a sterile fashion.   A curvilinear skin incision made along the upper border of the left  sternomastoid muscle.  Subcutaneous tissue and platysma divided with  electrocautery.  Deep dissection carried along the anterior border of the  sternomastoid to expose the carotid sheath.  The  facial vein ligated with 2-  0 silk and divided.  Carotid bifurcation exposed.  The common carotid artery  mobilized down to the omohyoid muscle and encircled with a vessel loop.  The  superior thyroid and external carotid artery were encircled with vessel  loops.  The internal carotid artery followed distally up to the posterior  belly of the digastric muscle.  The hypoglossal nerve and vagus nerve were  both clearly identified and preserved.  The distal internal carotid artery  encircled with a vessel loop.   The carotid bifurcation revealed marked thickening consistent with plaque.   The patient administered 7000 units of heparin intravenously.  Adequate  circulation time permitted.  Carotid vessels controlled with clamps.  A  longitudinal arteriotomy made in the distal common carotid artery.  The  arteriotomy then extended across the carotid bulb and up into the internal  carotid artery.  The proximal left internal carotid artery revealed  significant plaque with ulceration and degeneration.  There was a high-grade  stenosis present.  A shunt was then inserted.   Plaque removed with an endarterectomy elevator.  The endarterectomy carried  down into the common carotid artery, where the plaque was divided  transversely with Potts scissors.  Plaque then raised up into the bulb and  the superior thyroid and external carotid were endarterectomized using an  eversion technique.  The distal internal carotid artery plaque then  feathered out well.  Fragments of plaque were removed with fine forceps.  The site irrigated with heparin and saline solution.   A Finesse Dacron patch was then placed over the endarterectomy site using  running 6-0 Prolene suture.  At completion of the patch angioplasty, the  shunt was removed.  All vessels well-flushed.  Initial antegrade flow  directed up the external carotid artery, the internal carotid artery then  released.  There was an excellent pulse and  Doppler signal in the distal  internal carotid artery.   The patient administered 50 mg of protamine intravenously.  Adequate  hemostasis obtained. Sponge and counts correct.   The sternomastoid fascia closed with running 2-0 Vicryl suture.  Platysma  closed with running 3-0 Vicryl suture.  Skin closed with 4-0 Monocryl.  Steri-Strips applied.  The patient tolerated the procedure well.  No  apparent complications.  Transferred to the recovery room in stable  condition.      Joshua Wyatt, M.D.  Electronically Signed     PGH/MEDQ  D:  10/25/2005  T:  10/26/2005  Job:  [id number]   cc:   [name], M.D.  Fax: [phone]   Lajean Saver, M.D.  [place], [place]

## 2011-09-11 LAB — CBC
HCT: 37.7 — ABNORMAL LOW
Hemoglobin: 13
MCHC: 34.5
MCV: 88.3
Platelets: 214
RBC: 4.27
RDW: 12.6
WBC: 8.9

## 2011-09-11 LAB — BASIC METABOLIC PANEL
BUN: 15
CO2: 26
Calcium: 10.6 — ABNORMAL HIGH
Chloride: 99
Creatinine, Ser: 1.13
GFR calc Af Amer: >60
GFR calc non Af Amer: >60
Glucose, Bld: 95
Potassium: 4.2
Sodium: 132 — ABNORMAL LOW

## 2012-07-08 ENCOUNTER — Other Ambulatory Visit: Payer: Self-pay | Admitting: Dermatology

## 2013-01-05 ENCOUNTER — Other Ambulatory Visit (HOSPITAL_COMMUNITY): Payer: Self-pay | Admitting: Cardiovascular Disease

## 2013-01-05 DIAGNOSIS — I270 Primary pulmonary hypertension: Secondary | ICD-10-CM

## 2013-01-30 ENCOUNTER — Encounter (HOSPITAL_COMMUNITY): Payer: Self-pay

## 2013-02-03 ENCOUNTER — Encounter (HOSPITAL_COMMUNITY): Payer: Self-pay

## 2013-02-24 ENCOUNTER — Encounter (HOSPITAL_COMMUNITY): Payer: Self-pay

## 2013-03-02 ENCOUNTER — Ambulatory Visit (HOSPITAL_COMMUNITY)
Admission: RE | Admit: 2013-03-02 | Discharge: 2013-03-02 | Disposition: A | Discharge status: Home / Self Care | Payer: Medicare Other | Source: Ambulatory Visit | Attending: Cardiovascular Disease | Admitting: Cardiovascular Disease

## 2013-03-02 DIAGNOSIS — I270 Primary pulmonary hypertension: Secondary | ICD-10-CM

## 2013-03-02 DIAGNOSIS — I701 Atherosclerosis of renal artery: Secondary | ICD-10-CM

## 2013-03-02 DIAGNOSIS — M79661 Pain in right lower leg: Secondary | ICD-10-CM

## 2013-03-02 DIAGNOSIS — I672 Cerebral atherosclerosis: Secondary | ICD-10-CM

## 2013-03-02 DIAGNOSIS — J984 Other disorders of lung: Secondary | ICD-10-CM | POA: Insufficient documentation

## 2013-03-02 HISTORY — DX: Atherosclerosis of renal artery: I70.1

## 2013-03-02 HISTORY — DX: Cerebral atherosclerosis: I67.2

## 2013-03-02 HISTORY — DX: Pain in right lower leg: M79.661

## 2013-03-02 NOTE — Progress Notes (Signed)
Arterial Duplex Lower Ext. Completed. Joshua Wyatt D  

## 2013-03-02 NOTE — Progress Notes (Signed)
Renal Duplex Completed. Aparna Vanderweele D  

## 2013-03-02 NOTE — Progress Notes (Signed)
Carotid Duplex Completed. Akeyla Molden D  

## 2013-03-04 ENCOUNTER — Other Ambulatory Visit (HOSPITAL_COMMUNITY): Payer: Self-pay | Admitting: Cardiovascular Disease

## 2013-03-04 DIAGNOSIS — R079 Chest pain, unspecified: Secondary | ICD-10-CM

## 2013-03-06 ENCOUNTER — Other Ambulatory Visit: Payer: Self-pay | Admitting: *Deleted

## 2013-03-06 DIAGNOSIS — Z01810 Encounter for preprocedural cardiovascular examination: Secondary | ICD-10-CM

## 2013-03-18 ENCOUNTER — Encounter (HOSPITAL_COMMUNITY): Payer: Self-pay | Admitting: Pharmacy Technician

## 2013-03-23 ENCOUNTER — Other Ambulatory Visit: Payer: Self-pay | Admitting: Cardiovascular Disease

## 2013-03-23 ENCOUNTER — Ambulatory Visit
Admission: RE | Admit: 2013-03-23 | Discharge: 2013-03-23 | Disposition: A | Discharge status: Home / Self Care | Payer: Medicare Other | Source: Ambulatory Visit | Attending: Cardiovascular Disease | Admitting: Cardiovascular Disease

## 2013-03-23 DIAGNOSIS — Z01811 Encounter for preprocedural respiratory examination: Secondary | ICD-10-CM

## 2013-03-23 DIAGNOSIS — Z87891 Personal history of nicotine dependence: Secondary | ICD-10-CM

## 2013-03-23 IMAGING — CR DG CHEST 2V
2 series · 2 of 2 positions shown · non-contrast
Comparison: [DATE]

CLINICAL DATA: History of hypertension and coronary artery disease

CHEST - 2 VIEW

[view not recorded (1 of 2)]
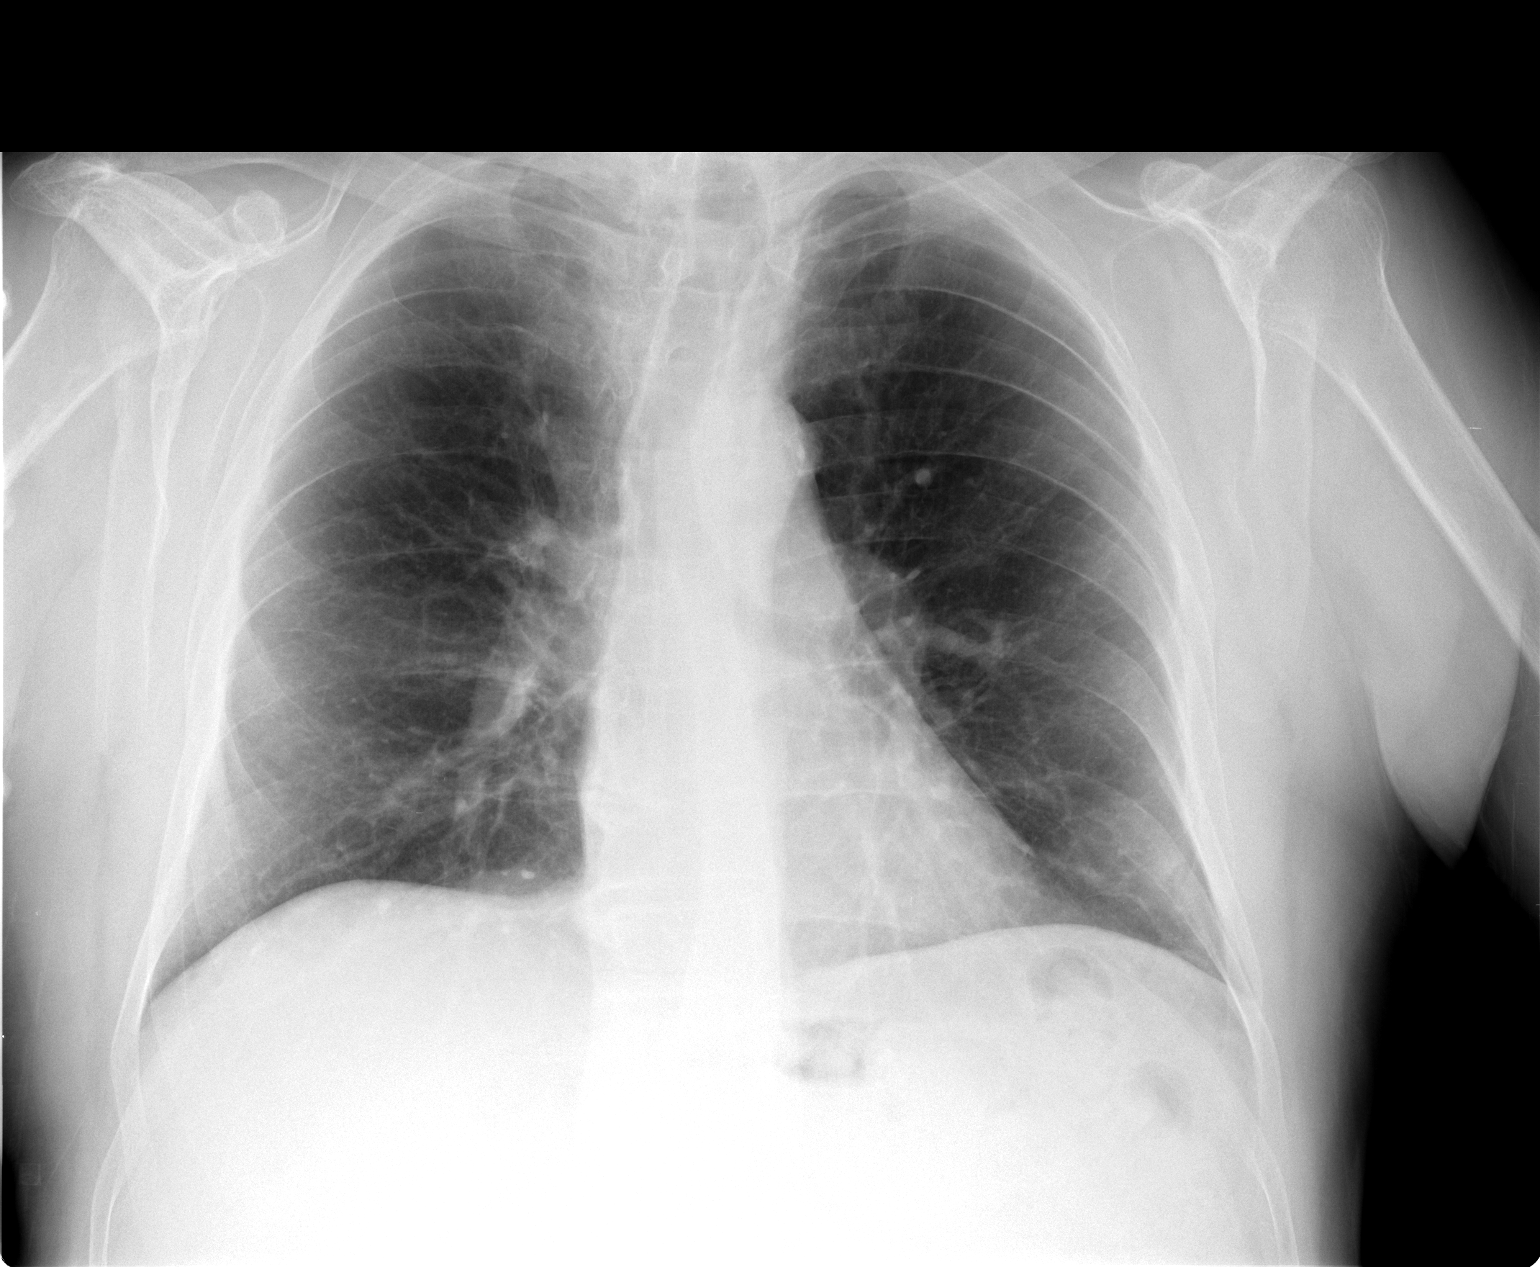

[view not recorded (2 of 2)]
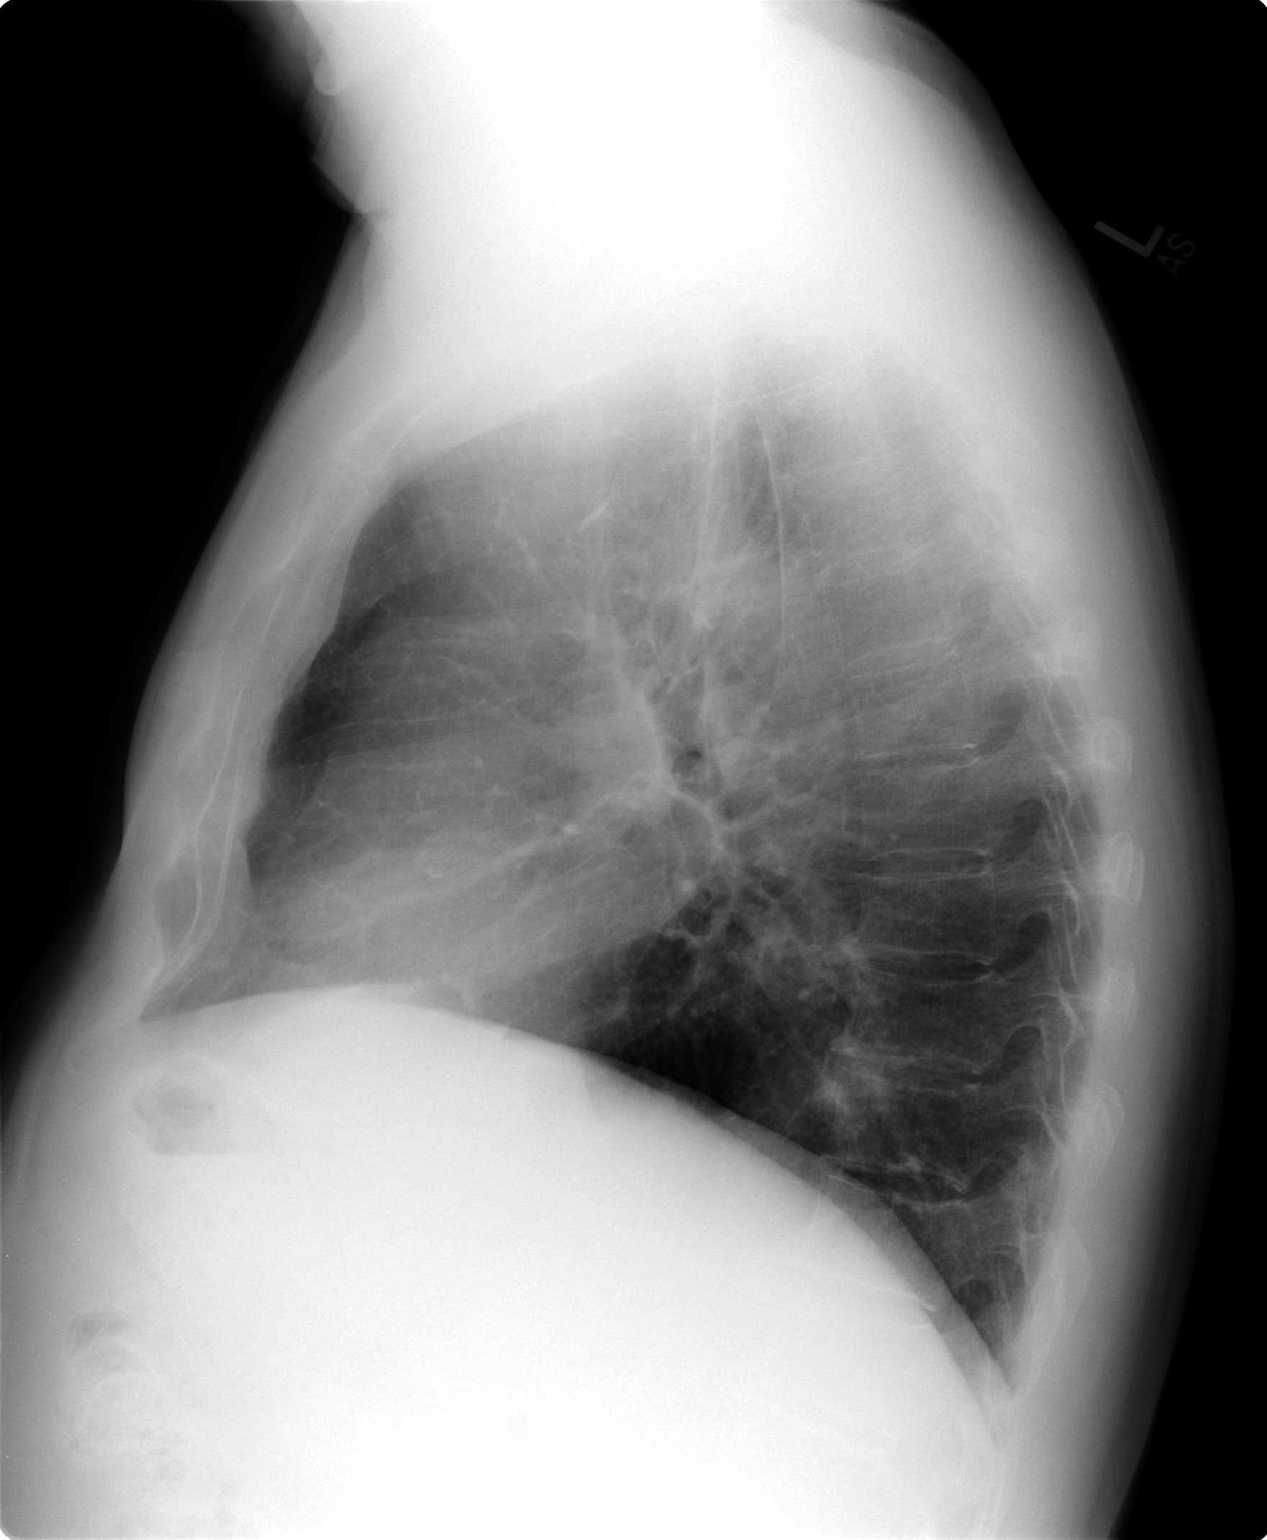

[2 of 2 positions shown; findings below may reference images not displayed]

FINDINGS: The heart and pulmonary vascularity are within normal
limits.  The lungs are clear bilaterally.  No acute bony
abnormality is noted.
IMPRESSION: No acute abnormalities seen.

## 2013-03-25 ENCOUNTER — Ambulatory Visit (HOSPITAL_COMMUNITY)
Admission: RE | Admit: 2013-03-25 | Discharge: 2013-03-25 | Disposition: A | Discharge status: Home / Self Care | Payer: Medicare Other | Source: Ambulatory Visit | Attending: Cardiovascular Disease | Admitting: Cardiovascular Disease

## 2013-03-25 DIAGNOSIS — IMO0001 Reserved for inherently not codable concepts without codable children: Secondary | ICD-10-CM

## 2013-03-25 DIAGNOSIS — R079 Chest pain, unspecified: Secondary | ICD-10-CM | POA: Insufficient documentation

## 2013-03-25 HISTORY — DX: Reserved for inherently not codable concepts without codable children: IMO0001

## 2013-03-25 MED ORDER — TECHNETIUM TC 99M SESTAMIBI GENERIC - CARDIOLITE
8.1000 | Freq: Once | INTRAVENOUS | Status: AC | PRN
  Administered 2013-03-25: 8 via INTRAVENOUS

## 2013-03-25 MED ORDER — TECHNETIUM TC 99M SESTAMIBI GENERIC - CARDIOLITE
25.2000 | Freq: Once | INTRAVENOUS | Status: AC | PRN
  Administered 2013-03-25: 25.2 via INTRAVENOUS

## 2013-03-25 MED ORDER — REGADENOSON 0.4 MG/5ML IV SOLN
0.4000 mg | Freq: Once | INTRAVENOUS | Status: AC
  Administered 2013-03-25: 0.4 mg via INTRAVENOUS

## 2013-03-25 NOTE — Procedures (Addendum)
[facility] [address] [place] [address] [phone]  Cardiology Nuclear Med Study  Joshua Wyatt is a 67 y.o. male     MRN : [id number]     DOB: 1945/12/27  Procedure Date: 03/25/2013  Nuclear Med Background Indication for Stress Test:  Surgical Clearance History:  no prior cardiac history Cardiac Risk Factors: Carotid Disease, Family History - CAD, History of Smoking, Hypertension, Lipids, Overweight and PVD  Symptoms:  Chest Pain, DOE and Fatigue   Nuclear Pre-Procedure Caffeine/Decaff Intake:  9:00pm NPO After: 7:00am   IV Site: R Forearm  IV 0.9% NS with Angio Cath:  22g  Chest Size (in):  42" IV Started by: Azucena Cecil, RN  Height: $Remove'5\' 7"'ErLyCAq$  (1.702 m)  Cup Size: n/a  BMI:  Body mass index is 25.21 kg/(m^2). Weight:  161 lb (73.029 kg)   Tech Comments:  N/A    Nuclear Med Study 1 or 2 day study: 1 day  Stress Test Type:  [place]  Order Authorizing Provider:  [name], MD   Resting Radionuclide: Technetium 97m Sestamibi  Resting Radionuclide Dose: 8.1 mCi   Stress Radionuclide:  Technetium 76m Sestamibi  Stress Radionuclide Dose: 25.2 mCi           Stress Protocol Rest HR: 59 Stress HR: 104  Rest BP: 136/81 Stress BP: 173/78  Exercise Time (min): n/a METS: n/a   Predicted Max HR: 152 bpm % Max HR: 68.42 bpm Rate Pressure Product: 17992  Dose of Adenosine (mg):  n/a Dose of Lexiscan: 0.4 mg  Dose of Atropine (mg): n/a Dose of Dobutamine: n/a mcg/kg/min (at max HR)  Stress Test Technologist: Leane Para, CCT Nuclear Technologist: Imagene Riches, CNMT   Rest Procedure:  Myocardial perfusion imaging was performed at rest 45 minutes following the intravenous administration of Technetium 44m Sestamibi. Stress Procedure:  The patient received IV Lexiscan 0.4 mg over 15-seconds.  Technetium 5m Sestamibi injected at 30-seconds.  There were no significant changes with Lexiscan.  Quantitative spect  images were obtained after a 45 minute delay.  Transient Ischemic Dilatation (Normal <1.22):  1.19 Lung/Heart Ratio (Normal <0.45):  0.26 QGS EDV:  76 ml QGS ESV:  30 ml LV Ejection Fraction: 60%  Signed by       Rest ECG: NSR - Normal EKG  Stress ECG: No significant change from baseline ECG  QPS Raw Data Images:  Normal; no motion artifact; normal heart/lung ratio. Stress Images:  Normal homogeneous uptake in all areas of the myocardium. Rest Images:  Normal homogeneous uptake in all areas of the myocardium. Subtraction (SDS):  No evidence of ischemia.  Impression Exercise Capacity:  Lexiscan with no exercise. BP Response:  Normal blood pressure response. Clinical Symptoms:  No significant symptoms noted. ECG Impression:  No significant ST segment change suggestive of ischemia. Comparison with Prior Nuclear Study: No significant change from previous study  Overall Impression:  Normal stress nuclear study. and Increased gut uptake.  LV Wall Motion:  NL LV Function; NL Wall Motion   Lorretta Harp, MD  03/25/2013 5:55 PM

## 2013-03-26 ENCOUNTER — Encounter (HOSPITAL_COMMUNITY): Payer: Medicare Other

## 2013-03-30 ENCOUNTER — Encounter (HOSPITAL_COMMUNITY)
Admission: RE | Disposition: A | Discharge status: Home / Self Care | Payer: Self-pay | Source: Ambulatory Visit | Attending: Cardiovascular Disease

## 2013-03-30 ENCOUNTER — Encounter (HOSPITAL_COMMUNITY): Payer: Self-pay | Admitting: General Practice

## 2013-03-30 ENCOUNTER — Ambulatory Visit (HOSPITAL_COMMUNITY)
Admission: RE | Admit: 2013-03-30 | Discharge: 2013-03-31 | Disposition: A | Discharge status: Home / Self Care | Payer: Medicare Other | Source: Ambulatory Visit | Attending: Cardiovascular Disease | Admitting: Cardiovascular Disease

## 2013-03-30 DIAGNOSIS — I70219 Atherosclerosis of native arteries of extremities with intermittent claudication, unspecified extremity: Secondary | ICD-10-CM | POA: Insufficient documentation

## 2013-03-30 DIAGNOSIS — Z01810 Encounter for preprocedural cardiovascular examination: Secondary | ICD-10-CM

## 2013-03-30 DIAGNOSIS — I701 Atherosclerosis of renal artery: Secondary | ICD-10-CM | POA: Insufficient documentation

## 2013-03-30 DIAGNOSIS — E785 Hyperlipidemia, unspecified: Secondary | ICD-10-CM | POA: Insufficient documentation

## 2013-03-30 DIAGNOSIS — Z23 Encounter for immunization: Secondary | ICD-10-CM | POA: Insufficient documentation

## 2013-03-30 DIAGNOSIS — I999 Unspecified disorder of circulatory system: Secondary | ICD-10-CM | POA: Insufficient documentation

## 2013-03-30 DIAGNOSIS — E782 Mixed hyperlipidemia: Secondary | ICD-10-CM

## 2013-03-30 DIAGNOSIS — I739 Peripheral vascular disease, unspecified: Secondary | ICD-10-CM

## 2013-03-30 DIAGNOSIS — I10 Essential (primary) hypertension: Secondary | ICD-10-CM | POA: Insufficient documentation

## 2013-03-30 DIAGNOSIS — E876 Hypokalemia: Secondary | ICD-10-CM | POA: Insufficient documentation

## 2013-03-30 HISTORY — DX: Essential (primary) hypertension: I10

## 2013-03-30 HISTORY — DX: Peripheral vascular disease, unspecified: I73.9

## 2013-03-30 HISTORY — PX: POPLITEAL ARTERY ANGIOPLASTY: SHX2242

## 2013-03-30 HISTORY — DX: Gastro-esophageal reflux disease without esophagitis: K21.9

## 2013-03-30 LAB — POCT ACTIVATED CLOTTING TIME: Activated Clotting Time: 165 seconds

## 2013-03-30 SURGERY — ABDOMINAL ANGIOGRAM
Laterality: Left

## 2013-03-30 MED ORDER — SODIUM CHLORIDE 0.9 % IV SOLN
INTRAVENOUS | Status: AC
Start: 2013-03-30 — End: 2013-03-30
  Administered 2013-03-30 (×2): via INTRAVENOUS

## 2013-03-30 MED ORDER — ZOLPIDEM TARTRATE 5 MG PO TABS: 5.0000 mg | ORAL_TABLET | Freq: Every evening | ORAL | Status: DC | PRN

## 2013-03-30 MED ORDER — DIAZEPAM 5 MG PO TABS
5.0000 mg | ORAL_TABLET | ORAL | Status: AC
  Administered 2013-03-30: 5 mg via ORAL
  Filled 2013-03-30: qty 1

## 2013-03-30 MED ORDER — DILTIAZEM HCL ER COATED BEADS 240 MG PO CP24
240.0000 mg | ORAL_CAPSULE | Freq: Every day | ORAL | Status: DC
  Filled 2013-03-30: qty 1

## 2013-03-30 MED ORDER — SODIUM CHLORIDE 0.9 % IJ SOLN: 3.0000 mL | INTRAMUSCULAR | Status: DC | PRN

## 2013-03-30 MED ORDER — CLOPIDOGREL BISULFATE 75 MG PO TABS: 75.0000 mg | ORAL_TABLET | Freq: Every day | ORAL | Status: DC

## 2013-03-30 MED ORDER — LOSARTAN POTASSIUM 50 MG PO TABS
100.0000 mg | ORAL_TABLET | Freq: Every day | ORAL | Status: DC
  Filled 2013-03-30: qty 2

## 2013-03-30 MED ORDER — HYDRALAZINE HCL 20 MG/ML IJ SOLN
INTRAMUSCULAR | Status: AC
  Filled 2013-03-30: qty 1

## 2013-03-30 MED ORDER — TRAMADOL HCL 50 MG PO TABS: 50.0000 mg | ORAL_TABLET | Freq: Four times a day (QID) | ORAL | Status: DC | PRN

## 2013-03-30 MED ORDER — LIDOCAINE HCL (PF) 1 % IJ SOLN
INTRAMUSCULAR | Status: AC
  Filled 2013-03-30: qty 30

## 2013-03-30 MED ORDER — MIDAZOLAM HCL 2 MG/2ML IJ SOLN
INTRAMUSCULAR | Status: AC
  Filled 2013-03-30: qty 2

## 2013-03-30 MED ORDER — ALPRAZOLAM 0.25 MG PO TABS: 0.2500 mg | ORAL_TABLET | Freq: Three times a day (TID) | ORAL | Status: DC | PRN

## 2013-03-30 MED ORDER — SODIUM CHLORIDE 0.9 % IV SOLN
INTRAVENOUS | Status: DC
  Administered 2013-03-30: 07:00:00 via INTRAVENOUS

## 2013-03-30 MED ORDER — HYDROCHLOROTHIAZIDE 12.5 MG PO CAPS
12.5000 mg | ORAL_CAPSULE | Freq: Every day | ORAL | Status: DC
  Filled 2013-03-30: qty 1

## 2013-03-30 MED ORDER — PNEUMOCOCCAL VAC POLYVALENT 25 MCG/0.5ML IJ INJ
0.5000 mL | INJECTION | INTRAMUSCULAR | Status: AC
  Administered 2013-03-30: 21:00:00 0.5 mL via INTRAMUSCULAR
  Filled 2013-03-30: qty 0.5

## 2013-03-30 MED ORDER — PANTOPRAZOLE SODIUM 40 MG PO TBEC: 80.0000 mg | DELAYED_RELEASE_TABLET | Freq: Every day | ORAL | Status: DC

## 2013-03-30 MED ORDER — ASPIRIN 81 MG PO CHEW
CHEWABLE_TABLET | ORAL | Status: AC
  Filled 2013-03-30: qty 4

## 2013-03-30 MED ORDER — ACETAMINOPHEN 325 MG PO TABS: 650.0000 mg | ORAL_TABLET | ORAL | Status: DC | PRN

## 2013-03-30 MED ORDER — MORPHINE SULFATE 2 MG/ML IJ SOLN
2.0000 mg | INTRAMUSCULAR | Status: DC | PRN
  Administered 2013-03-30: 2 mg via INTRAVENOUS
  Filled 2013-03-30: qty 1

## 2013-03-30 MED ORDER — LOSARTAN POTASSIUM-HCTZ 100-12.5 MG PO TABS: 1.0000 | ORAL_TABLET | Freq: Every day | ORAL | Status: DC

## 2013-03-30 MED ORDER — ONDANSETRON HCL 4 MG/2ML IJ SOLN
4.0000 mg | Freq: Four times a day (QID) | INTRAMUSCULAR | Status: DC | PRN
  Administered 2013-03-30: 4 mg via INTRAVENOUS
  Filled 2013-03-30: qty 2

## 2013-03-30 MED ORDER — HEPARIN SODIUM (PORCINE) 1000 UNIT/ML IJ SOLN
INTRAMUSCULAR | Status: AC
  Filled 2013-03-30: qty 1

## 2013-03-30 MED ORDER — FENTANYL CITRATE 0.05 MG/ML IJ SOLN
INTRAMUSCULAR | Status: AC
  Filled 2013-03-30: qty 2

## 2013-03-30 MED ORDER — TAMSULOSIN HCL 0.4 MG PO CAPS
0.4000 mg | ORAL_CAPSULE | Freq: Every day | ORAL | Status: DC
  Administered 2013-03-30: 0.4 mg via ORAL
  Filled 2013-03-30 (×2): qty 1

## 2013-03-30 MED ORDER — ASPIRIN EC 81 MG PO TBEC
81.0000 mg | DELAYED_RELEASE_TABLET | Freq: Every day | ORAL | Status: DC
  Administered 2013-03-30: 21:00:00 81 mg via ORAL
  Filled 2013-03-30 (×2): qty 1

## 2013-03-30 MED ORDER — HYDRALAZINE HCL 20 MG/ML IJ SOLN: 10.0000 mg | Freq: Once | INTRAMUSCULAR | Status: DC

## 2013-03-30 MED ORDER — HEPARIN (PORCINE) IN NACL 2-0.9 UNIT/ML-% IJ SOLN
INTRAMUSCULAR | Status: AC
  Filled 2013-03-30: qty 1000

## 2013-03-30 MED ORDER — HYDRALAZINE HCL 20 MG/ML IJ SOLN
10.0000 mg | Freq: Once | INTRAMUSCULAR | Status: AC
  Administered 2013-03-30: 10 mg via INTRAVENOUS

## 2013-03-30 NOTE — CV Procedure (Signed)
Joshua Wyatt is a 67 y.o. male    [id number] LOCATION:  FACILITY: [facility]  PHYSICIAN: [name], M.D. 02-28-1946   DATE OF PROCEDURE:  03/30/2013  DATE OF DISCHARGE:  [place]  PV Angio/ Intervention    History obtained from chart review. Joshua Wyatt is a 67 year old thin appearing married Caucasian male with no children who I recently saw in the office 03/04/2013. He has a history of PV OD status post bilateral carotid endarterectomies performed by Dr. Drucie Opitz in 2007. We have had vomited up with ultrasound. I stented both of his SFAs back in 09 as well as his right renal artery. His other problems include hypertension hypokalemia. He has left calf claudication with Dopplers reveal left ABI 0.42 with left iliac SFA and popliteal disease. He presents now for angiography and potential intervention for lifestyle limiting claudication.   PROCEDURE DESCRIPTION:    The patient was brought to the second floor [facility] cath lab in the postabsorptive state. He was premedicated withValium 5 mg by mouth, IV Versed and fentanyl.[name] His right groin was prepped and shaved in usual sterile fashion. Xylocaine 1% was used for local anesthesia. A 5 French sheath was inserted into the right common femoral artery using standard Seldinger technique. A 5 French pigtail catheter was used for abdominal aortography with bifemoral runoff using bolus chase digital subtraction step table technique. Visipaque dye was used for the entirety of the case. Retrograde aortic pressure was monitored during the case.   HEMODYNAMICS:    AO SYSTOLIC/AO DIASTOLIC: 818/56    ANGIOGRAPHIC RESULTS:   1: Abdominal aortogram-the right renal artery stent was widely patent. There was a 50-60% proximal left renal artery stenosis. There was moderate infrarenal abdominal aortic atherosclerotic changes with fluoroscopic calcification.  2: Left lower extremity-there is 50-60% calcified  segmental proximal and mid left SFA stenosis. The left SFA stent was widely patent. There was a 95% focal above-the-knee popliteal artery stenosis with three-vessel runoff.  3: Right lower extremity-there was a 60% focal lesion in the right common iliac artery. There is a 50% segmental proximal to mid right SFA stenosis with a patent stent in the mid to distal right SFA. There was a 60% right popliteal stenosis with three-vessel runoff. The anterior tibial is occluded.  IMPRESSION:Mr. Dacy has patent SFA stents with a high-grade left above-the-knee popliteal stenosis probably accounting for his symptoms. We'll proceed with cutting balloon atherectomy with provisional stenting.  Procedure description: Contralateral access was obtained with a crossover catheter, 0.035 Versicore wire and a 6 [place] crossover sheath. The patient received a total of 7500 units of heparin with an ACT of 241. Total contrast administered the patient was 169 cc. Using an 014/300 cm length [place] core wire preloaded in a 5 mm x 2 cm angiosculpt  cutting balloon the lesion was crossed and a gentle prolonged inflation was performed at 6 atmospheres for 2-1/2 minutes.the final angiographic result with reduction of a 95% focal lesion to less than 20% residual without dissection with excellent flow  Final impression: Successful endoscopic angioplasty of the above-the-knee high-grade popliteal stenosis for lifestyle limiting claudication. It should be noted that the cross over sheath was placed in the left external iliac artery. The wire and was removed and the sheath was withdrawn across the bifurcation. It was secured in place. The patient left the Cath Lab in stable condition. He will be hydrated overnight, discharged on the morning. He is already on aspirin and Plavix. He will  get followup Dopplers in our office after which he'll see me back.  Lorretta Harp MD, [facility], [facility], [facility] 03/30/2013 10:57 AM

## 2013-03-30 NOTE — Progress Notes (Signed)
Site area: right groin  Site Prior to Removal:  Level 0  Pressure Applied For 20 MINUTES    Minutes Beginning at 1355  Manual:   yes  Patient Status During Pull:  Pressure dropped to 82/45 at 1410 with complaints of nausea.Given zofran for nausea and bolused with fluids for blood pressure. 1415 91/37 with no further nausea. 1420 105/42.  Post Pull Groin Site:  Level 0  Post Pull Instructions Given:  yes  Post Pull Pulses Present:  yes  Dressing Applied:  yes  Comments:  Patient resting quietly, eating lunch. No further complaints of nausea SBP remains in the 90's.

## 2013-03-30 NOTE — H&P (Signed)
  H & P will be scanned in.  Pt was reexamined and existing H & P reviewed. No changes found.  Lorretta Harp, MD [facility] 03/30/2013 9:29 AM

## 2013-03-30 NOTE — Plan of Care (Signed)
Problem: Consults Goal: Vascular Cath Int Patient Education (See Patient Education module for education specifics.) Outcome: Completed/Met Date Met:  03/30/13 Written Groin care instructions given and reviewed w/ patient.  Questions answered.

## 2013-03-30 NOTE — Progress Notes (Signed)
Utilization Review Completed Demonie Kassa J. Laryah Neuser, RN, BSN, NCM [phone]  

## 2013-03-31 ENCOUNTER — Other Ambulatory Visit: Payer: Self-pay | Admitting: Cardiology

## 2013-03-31 DIAGNOSIS — E782 Mixed hyperlipidemia: Secondary | ICD-10-CM

## 2013-03-31 DIAGNOSIS — I10 Essential (primary) hypertension: Secondary | ICD-10-CM

## 2013-03-31 DIAGNOSIS — I739 Peripheral vascular disease, unspecified: Secondary | ICD-10-CM

## 2013-03-31 LAB — CBC
HCT: 32.3 % — ABNORMAL LOW (ref 39.0–52.0)
MCHC: 34.4 g/dL (ref 30.0–36.0)
MCV: 85 fL (ref 78.0–100.0)
Platelets: 226 10*3/uL (ref 150–400)
RDW: 13.1 % (ref 11.5–15.5)
WBC: 7.9 10*3/uL (ref 4.0–10.5)

## 2013-03-31 LAB — BASIC METABOLIC PANEL
BUN: 15 mg/dL (ref 6–23)
CO2: 24 mEq/L (ref 19–32)
Calcium: 10.1 mg/dL (ref 8.4–10.5)
Chloride: 100 mEq/L (ref 96–112)
Creatinine, Ser: 0.9 mg/dL (ref 0.50–1.35)
GFR calc Af Amer: >90 mL/min (ref >90)

## 2013-03-31 LAB — POCT ACTIVATED CLOTTING TIME: Activated Clotting Time: 241 seconds

## 2013-03-31 MED ORDER — PANTOPRAZOLE SODIUM 40 MG PO TBEC: 80.0000 mg | DELAYED_RELEASE_TABLET | Freq: Every day | ORAL | Status: DC

## 2013-03-31 NOTE — Progress Notes (Signed)
Subjective:  Left foot feels clinically improved  Objective:  Temp:  [97.4 F (36.3 C)-98.5 F (36.9 C)] 98 F (36.7 C) (04/22 0644) Pulse Rate:  [50-73] 64 (04/21 1600) Resp:  [14-20] 20 (04/22 0644) BP: (82-183)/(37-80) 131/59 mmHg (04/22 0644) SpO2:  [94 %-100 %] 96 % (04/22 0644) Weight change:   Intake/Output from previous day: 04/21 0701 - 04/22 0700 In: 1165 [P.O.:440; I.V.:725] Out: 1700 [Urine:1700]  Intake/Output from this shift:    Physical Exam: General appearance: alert and no distress Neck: no adenopathy, no carotid bruit, no JVD, supple, symmetrical, trachea midline and thyroid not enlarged, symmetric, no tenderness/mass/nodules Lungs: clear to auscultation bilaterally Heart: regular rate and rhythm, S1, S2 normal, no murmur, click, rub or gallop Extremities: extremities normal, atraumatic, no cyanosis or edema and Right groin OK, left foot warm  Lab Results: Results for orders placed during the hospital encounter of 03/30/13 (from the past 48 hour(s))  POCT ACTIVATED CLOTTING TIME     Status: None   Collection Time    03/30/13  1:22 PM      Result Value Range   Activated Clotting Time 944    BASIC METABOLIC PANEL     Status: Abnormal   Collection Time    03/31/13  5:23 AM      Result Value Range   Sodium 135  135 - 145 mEq/L   Potassium 3.9  3.5 - 5.1 mEq/L   Chloride 100  96 - 112 mEq/L   CO2 24  19 - 32 mEq/L   Glucose, Bld 99  70 - 99 mg/dL   BUN 15  6 - 23 mg/dL   Creatinine, Ser 0.90  0.50 - 1.35 mg/dL   Calcium 10.1  8.4 - 10.5 mg/dL   GFR calc non Af Amer 85 (*) >90 mL/min   GFR calc Af Amer >90  >90 mL/min   Comment:            The eGFR has been calculated     using the CKD EPI equation.     This calculation has not been     validated in all clinical     situations.     eGFR's persistently     <90 mL/min signify     possible Chronic Kidney Disease.  CBC     Status: Abnormal   Collection Time    03/31/13  5:23 AM      Result Value  Range   WBC 7.9  4.0 - 10.5 K/uL   RBC 3.80 (*) 4.22 - 5.81 MIL/uL   Hemoglobin 11.1 (*) 13.0 - 17.0 g/dL   HCT 32.3 (*) 39.0 - 52.0 %   MCV 85.0  78.0 - 100.0 fL   MCH 29.2  26.0 - 34.0 pg   MCHC 34.4  30.0 - 36.0 g/dL   RDW 13.1  11.5 - 15.5 %   Platelets 226  150 - 400 K/uL    Imaging: Imaging results have been reviewed  Assessment/Plan:   1. Active Problems: 2.   * No active hospital problems. * 3.   Time Spent Directly with Patient:  20 minutes  Length of Stay:  LOS: 1 day   S/P cutting balloon PTA Left above the knee popliteal artery with an excellent angiographic result. Right groin OK. Labs OK. Exam benign. Labs OK. D/C home this AM. LEA then ROV with me. Pt already on ASA/Plavix.  Lorretta Harp 03/31/2013, 7:54 AM

## 2013-03-31 NOTE — Discharge Summary (Signed)
Physician Discharge Summary  Patient ID: Joshua Wyatt MRN: [id number] DOB/AGE: 04-14-1946 67 y.o.  Admit date: 03/30/2013 Discharge date: 03/31/2013  Admission Diagnoses: Claudication of left lower extremity  Discharge Diagnoses:  Principal Problem:   Claudication of left lower extremity Active Problems:   PVD (peripheral vascular disease)   Hypertension   HLD (hyperlipidemia)   Discharged Condition: stable  Hospital Course:  The patient is a 67 year old Caucasian male followed at [facility] by Dr. Gwenlyn Found. He has a history of PVOD, status post bilateral carotid endarterectomies, performed by Dr. Drucie Opitz in 2007. He has also had bilateral SFA intervention as well as right renal artery stenting. His other problems include hypertension and hyperlipidemia. The patient presented to [facility] on 03/30/13 to undergo planned peripheral vascular arteriography of the lower extremities, for progressive lifestyle limiting claudication. Preoperative lower extremity dopplers revealed a right ABI of 0.93 and a left ABI of 0.82. The studies also suggested moderate lilac disease as well as high grade left SFA and popliteal stenosis.  The procedure was performed by Dr. Gwenlyn Found. The abdominal aortogram revealed the right renal artery stent to be widely patent. There was a 50-60% proximal left renal artery stenosis. There was moderate infrarenal abdominal aortic atherosclerotic changes with fluroscopic calcification. In the left lower extremity, there was  50-60% calcified segmental proximal and mid left SFA stenosis.  The left SFA was widely patent. There was a 95% focal above-the-knee popliteal artery stenosis with three-vessel runoff. In the right lower extremity, there was a 60% focal lesion in the right common illiac artery. There was a 50% segmental proximal to mid right SFA stenosis with a patent stent in the mid to distal right SFA. There was a 60% right popliteal stenosis with threee-vessel runoff. The anterior tibial  was occluded. It was decided to intervene on the left. He underwent sucessful angioplasty of the high grade, above-the-knee popliteal stenosis. He had good angiographic result. He left the cath lab in stable condition. He was kept overnight for observation and hydration. He had no postoperative complications. The next day, he was seen and examined by Dr. Gwenlyn Found. The groin was stable and he had no leg pain. Dr. Gwenlyn Found determined he was stable for discharge home. He was instructed to continue with ASA and Plavix. He will have left lower extremity arterial dopplers done at [facility]. He is scheduled to follow up with Dr. Gwenlyn Found on 04/27/13.   Consults: None  Significant Diagnostic Studies:   PV Angio 4/21 HEMODYNAMICS:  AO SYSTOLIC/AO DIASTOLIC: 409/81  ANGIOGRAPHIC RESULTS:  1: Abdominal aortogram-the right renal artery stent was widely patent. There was a 50-60% proximal left renal artery stenosis. There was moderate infrarenal abdominal aortic atherosclerotic changes with fluoroscopic calcification.  2: Left lower extremity- there was  50-60% calcified segmental proximal and mid left SFA stenosis.  The left SFA was widely patent. There was a 95% focal above-the-knee popliteal artery stenosis with three-vessel runoff. 3: Right lower extremity-there was a 60% focal lesion in the right common iliac artery. There is a 50% segmental proximal to mid right SFA stenosis with a patent stent in the mid to distal right SFA. There was a 60% right popliteal stenosis with three-vessel runoff. The anterior tibial is occluded.  IMPRESSION: Mr. Bowker has patent SFA stents with a high-grade left above-the-knee popliteal stenosis probably accounting for his symptoms. We'll proceed with cutting balloon atherectomy with provisional stenting.    Treatments: See Hospital Course  Discharge Exam: Blood pressure 120/66, pulse 100, temperature 98 F (  36.7 C), temperature source Oral, resp. rate 20, height $RemoveBe'5\' 7"'WFyzrCKXE$  (1.702 m), weight  155 lb (70.308 kg), SpO2 97.00%.   Disposition:       Discharge Orders   Future Appointments Provider Department Dept Phone   04/27/2013 9:15 AM Lorretta Harp, MD [place] [phone]   Future Orders Complete By Expires     Diet - low sodium heart healthy  As directed     Driving Restrictions  As directed     Comments:      No driving for 3 days    Increase activity slowly  As directed     Lifting restrictions  As directed     Comments:      No lifting more than 1/2 gallon of milk for 3 days        Medication List    STOP taking these medications       omeprazole 20 MG capsule  Commonly known as:  PRILOSEC  Replaced by:  pantoprazole 40 MG tablet      TAKE these medications       aspirin EC 81 MG tablet  Take 81 mg by mouth daily.     clopidogrel 75 MG tablet  Commonly known as:  PLAVIX  Take 75 mg by mouth daily.     diltiazem 240 MG 24 hr capsule  Commonly known as:  CARDIZEM CD  Take 240 mg by mouth daily.     losartan-hydrochlorothiazide 100-12.5 MG per tablet  Commonly known as:  HYZAAR  Take 1 tablet by mouth daily.     pantoprazole 40 MG tablet  Commonly known as:  PROTONIX  Take 2 tablets (80 mg total) by mouth daily.     tamsulosin 0.4 MG Caps  Commonly known as:  FLOMAX  Take by mouth at bedtime.       Follow-up Information   Follow up with Lorretta Harp, MD On 04/27/2013. (9:15 am)    Contact information:   [address] [place] [address] [phone]      TIME SPENT ON DISCHARGE, INCLUDING PHYSICIAN TIME: > 30 MINUTES  Signed: Consuelo Pandy, PA-C 03/31/2013, 9:15 AM

## 2013-04-13 ENCOUNTER — Encounter: Payer: Self-pay | Admitting: *Deleted

## 2013-04-14 ENCOUNTER — Encounter: Payer: Self-pay | Admitting: Cardiovascular Disease

## 2013-04-27 ENCOUNTER — Encounter: Payer: Self-pay | Admitting: Cardiovascular Disease

## 2013-04-27 ENCOUNTER — Ambulatory Visit (INDEPENDENT_AMBULATORY_CARE_PROVIDER_SITE_OTHER): Payer: Medicare Other | Admitting: Cardiovascular Disease

## 2013-04-27 VITALS — BP 160/88 | HR 60 | Ht 67.0 in | Wt 160.0 lb

## 2013-04-27 DIAGNOSIS — Z79899 Other long term (current) drug therapy: Secondary | ICD-10-CM

## 2013-04-27 DIAGNOSIS — I10 Essential (primary) hypertension: Secondary | ICD-10-CM | POA: Insufficient documentation

## 2013-04-27 DIAGNOSIS — E785 Hyperlipidemia, unspecified: Secondary | ICD-10-CM

## 2013-04-27 DIAGNOSIS — I739 Peripheral vascular disease, unspecified: Secondary | ICD-10-CM

## 2013-04-27 NOTE — Progress Notes (Signed)
04/27/2013 Joshua Wyatt   July 03, 1946  [id number]  Primary Physician Joshua Sheller, MD Primary Cardiologist: Joshua Harp MD Joshua Wyatt   HPI:  The patient is a 67 year old thin appearing married Caucasian male with no children who I last saw in the office 4 months ago. He has a history of PVOD status post bilateral carotid endarterectomies performed by Dr. Drucie Wyatt in 2007. We have been following him by duplex ultrasound which performed most recently several days ago revealed this to be widely patent. He has also had bilateral SFA intervention as well as right renal artery stenting. His other problems include hypertension and hyperlipidemia. He did have an episode of chest heaviness recently, but more importantly he has complained of progressive lifestyle limiting claudication since I last saw him. Most recent lower extremity Dopplers reveal a right ABI of 0.93 and a left of 0.82. He does appear to have moderate iliac disease as well as high grade right SFA and popliteal stenosis. He underwent abdominal aortography with bifemoral runoff on 03/30/13 revealed a widely patent right renal artery stent, 50-60% proximal left renal artery stenosis. The left SFA was while he stayed patent and he had a 95% focal above-the-knee popliteal artery stenosis with three-vessel runoff. There was a 60% focal lesion in the right common iliac artery and a 50% segmental proximal to mid right SFA stenosis with a patent stent in the mid to distal right SFA. He also had a 50% right popliteal stenosis with three-vessel runoff. He underwent cutting balloon angioplasty of the left above-the-knee popliteal artery and Angiosculpt balloon with an excellent angiographic result and was discharged the following day. Since discharge she is enjoyed no claudication.    Current Outpatient Prescriptions  Medication Sig Dispense Refill  . aspirin EC 81 MG tablet Take 81 mg by mouth daily.      . clopidogrel (PLAVIX) 75  MG tablet Take 75 mg by mouth daily.      [name] diltiazem (CARDIZEM CD) 240 MG 24 hr capsule Take 240 mg by mouth daily.      . fenofibrate micronized (LOFIBRA) 134 MG capsule Take 134 mg by mouth daily before breakfast.      . losartan-hydrochlorothiazide (HYZAAR) 100-12.5 MG per tablet Take 1 tablet by mouth daily.      [name] omeprazole (PRILOSEC) 20 MG capsule Take 20 mg by mouth as needed.       . pravastatin (PRAVACHOL) 20 MG tablet Take 20 mg by mouth daily.      . tamsulosin (FLOMAX) 0.4 MG CAPS Take by mouth at bedtime.      . pantoprazole (PROTONIX) 40 MG tablet Take 2 tablets (80 mg total) by mouth daily.  60 tablet  5   No current facility-administered medications for this visit.    Allergies  Allergen Reactions  . Statins Other (See Comments)    unspecified    History   Social History  . Marital Status: Married    Spouse Name: N/A    Number of Children: N/A  . Years of Education: N/A   Occupational History  . Not on file.   Social History Main Topics  . Smoking status: Former Smoker    Types: Cigars    Quit date: 12/11/2007  . Smokeless tobacco: Never Used  . Alcohol Use: No  . Drug Use: No  . Sexually Active: Not on file     Comment: RARE  CIG[employer]   Other Topics Concern  . Not on file   Social History  Narrative  . No narrative on file     Review of Systems: General: negative for chills, fever, night sweats or weight changes.  Cardiovascular: negative for chest pain, dyspnea on exertion, edema, orthopnea, palpitations, paroxysmal nocturnal dyspnea or shortness of breath Dermatological: negative for rash Respiratory: negative for cough or wheezing Urologic: negative for hematuria Abdominal: negative for nausea, vomiting, diarrhea, bright red blood per rectum, melena, or hematemesis Neurologic: negative for visual changes, syncope, or dizziness All other systems reviewed and are otherwise negative except as noted above.    Blood pressure 160/88, pulse 60,  height $Remov'5\' 7"'Kelkmv$  (1.702 m), weight 160 lb (72.576 kg).  General appearance: alert and no distress Neck: no adenopathy, no JVD, supple, symmetrical, trachea midline, thyroid not enlarged, symmetric, no tenderness/mass/nodules and there are bilateral carotid bruits noted Lungs: clear to auscultation bilaterally Heart: regular rate and rhythm, S1, S2 normal, no murmur, click, rub or gallop Extremities: extremities normal, atraumatic, no cyanosis or edema and the right common femoral arterial puncture site is well-healed. He has a 2+ left pedal pulse.  EKG not performed today  ASSESSMENT AND PLAN:   PVD (peripheral vascular disease) [name] underwent abdominal aortography with bifemoral runoff revealing patent stents with a high-grade stenosis in the above-the-knee popliteal on the left. He underwent cutting balloon angioplasty with an Angiosculpt balloon with excellent angiographic result and was discharged the following day.His claudication has completely resolved.  Essential hypertension Blood pressure is mildly elevated today at 160/88. We'll continue to monitor  Hyperlipidemia On no medications because of statin intolerance. We will recheck a lipid and liver profile.      Joshua Harp MD FACP,FACC,FAHA, [facility] 04/27/2013 9:57 AM

## 2013-04-27 NOTE — Assessment & Plan Note (Signed)
Blood pressure is mildly elevated today at 160/88. We'll continue to monitor

## 2013-04-27 NOTE — Patient Instructions (Signed)
  Your physician wants you to follow-up with him in : 12 MONTHS                                             and with an extender in : [place] will receive a reminder letter in the mail one month in advance. If you don't receive a letter, please call our office to schedule the follow-up appointment.   Your physician recommends that you return for lab work in: [place]  Your physician has ordered the following tests: LOWER EXTREMITY DOPPLERS

## 2013-04-27 NOTE — Assessment & Plan Note (Signed)
Joshua Wyatt underwent abdominal aortography with bifemoral runoff revealing patent stents with a high-grade stenosis in the above-the-knee popliteal on the left. He underwent cutting balloon angioplasty with an Angiosculpt balloon with excellent angiographic result and was discharged the following day.His claudication has completely resolved.

## 2013-04-27 NOTE — Assessment & Plan Note (Signed)
On no medications because of statin intolerance. We will recheck a lipid and liver profile.

## 2013-05-12 ENCOUNTER — Encounter (HOSPITAL_COMMUNITY): Payer: Medicare Other

## 2013-05-27 ENCOUNTER — Ambulatory Visit (HOSPITAL_COMMUNITY)
Admission: RE | Admit: 2013-05-27 | Discharge: 2013-05-27 | Disposition: A | Discharge status: Home / Self Care | Payer: Medicare Other | Source: Ambulatory Visit | Attending: Cardiovascular Disease | Admitting: Cardiovascular Disease

## 2013-05-27 DIAGNOSIS — I739 Peripheral vascular disease, unspecified: Secondary | ICD-10-CM

## 2013-06-10 ENCOUNTER — Other Ambulatory Visit (HOSPITAL_COMMUNITY): Payer: Self-pay | Admitting: Cardiovascular Disease

## 2013-06-10 DIAGNOSIS — I739 Peripheral vascular disease, unspecified: Secondary | ICD-10-CM

## 2013-06-11 ENCOUNTER — Ambulatory Visit (HOSPITAL_COMMUNITY)
Admission: RE | Admit: 2013-06-11 | Discharge: 2013-06-11 | Disposition: A | Discharge status: Home / Self Care | Payer: Medicare Other | Source: Ambulatory Visit | Attending: Cardiovascular Disease | Admitting: Cardiovascular Disease

## 2013-06-11 DIAGNOSIS — I739 Peripheral vascular disease, unspecified: Secondary | ICD-10-CM

## 2013-06-11 DIAGNOSIS — I70219 Atherosclerosis of native arteries of extremities with intermittent claudication, unspecified extremity: Secondary | ICD-10-CM

## 2013-06-11 NOTE — Progress Notes (Signed)
Arterial Left Lower Ext. Completed. Oda Cogan, RDMS, RVT

## 2013-07-02 ENCOUNTER — Other Ambulatory Visit (HOSPITAL_COMMUNITY): Payer: Self-pay | Admitting: Cardiovascular Disease

## 2013-07-02 ENCOUNTER — Other Ambulatory Visit: Payer: Self-pay | Admitting: *Deleted

## 2013-07-02 MED ORDER — CLOPIDOGREL BISULFATE 75 MG PO TABS: 75.0000 mg | ORAL_TABLET | Freq: Every day | ORAL | Status: DC

## 2013-07-08 ENCOUNTER — Telehealth: Payer: Self-pay | Admitting: Cardiovascular Disease

## 2013-07-08 MED ORDER — CLOPIDOGREL BISULFATE 75 MG PO TABS: 75.0000 mg | ORAL_TABLET | Freq: Every day | ORAL | Status: DC

## 2013-07-08 MED ORDER — DILTIAZEM HCL ER 240 MG PO CP24: 240.0000 mg | ORAL_CAPSULE | Freq: Every day | ORAL | Status: DC

## 2013-07-08 NOTE — Telephone Encounter (Signed)
Lease call-need to talk to you about his medicine-it keeps going up more and more.

## 2013-07-08 NOTE — Telephone Encounter (Signed)
Please call-need to talk to you again.

## 2013-07-08 NOTE — Telephone Encounter (Signed)
Returned call.  Pt asked that Rxs be sent to [facility] on [address].  Refill(s) sent to pharmacy.  Omeprazole dc'd r/t ? Interaction w/ Plavix and pt stated he is not taking it.  Offered to switch to Protonix (pantoprazole) per recommendations and pt declined. Stated he doesn't think he will be taking it and agreed not to take omeprazole while on Plavix.

## 2013-07-08 NOTE — Telephone Encounter (Signed)
Returned call.  Pt stated his medicine keeps going up and he called his insurance company.  Stated the insurance company told him the manufacturer is raising the price.  Pt informed that unfortunately we cannot control the price the manufacturer charges for the meds.  Pt informed he can contact his insurance company to find out if 90-day supplies will be more affordable b/c they usually are or he can call [facility] to find out if they will be more affordable there.  Pt verbalized understanding and agreed w/ plan.  Pt will call back with more details and Rxs can be sent to pharmacy of choice.  **Of note, pt was advised to check if he can use mail order pharmacy and declined r/t not having a credit/debit card.

## 2013-07-08 NOTE — Telephone Encounter (Signed)
Returned call.  Pt stated [facility] told him they didn't receive a prescription for Plavix.  Pt informed both Rxs were sent electronically and confirmation received.  Pt informed will be resent.  Pt verbalized understanding and agreed w/ plan.  Call to [facility] and informed both Rxs received and Plavix is on hold b/c insurance will not pay for refill until the 18th.  Call to pt and informed.  Pt verbalized understanding and agreed w/ plan.  Stated he will go back to [facility] tomorrow to get med.

## 2013-07-08 NOTE — Telephone Encounter (Signed)
Please call asap-waiting at the pharmacy.

## 2013-11-07 ENCOUNTER — Other Ambulatory Visit (HOSPITAL_COMMUNITY): Payer: Self-pay | Admitting: Cardiovascular Disease

## 2013-11-09 NOTE — Telephone Encounter (Signed)
Rx was sent to pharmacy electronically. 

## 2013-11-20 ENCOUNTER — Encounter: Payer: Self-pay | Admitting: Cardiology

## 2013-11-20 ENCOUNTER — Ambulatory Visit (INDEPENDENT_AMBULATORY_CARE_PROVIDER_SITE_OTHER): Payer: Medicare Other | Admitting: Cardiology

## 2013-11-20 ENCOUNTER — Ambulatory Visit: Payer: Medicare Other | Admitting: Cardiology

## 2013-11-20 VITALS — BP 146/82 | HR 58 | Ht 68.0 in | Wt 158.4 lb

## 2013-11-20 DIAGNOSIS — I739 Peripheral vascular disease, unspecified: Secondary | ICD-10-CM

## 2013-11-20 DIAGNOSIS — E785 Hyperlipidemia, unspecified: Secondary | ICD-10-CM

## 2013-11-20 DIAGNOSIS — I10 Essential (primary) hypertension: Secondary | ICD-10-CM

## 2013-11-20 NOTE — Assessment & Plan Note (Signed)
Followed by PCP, we will get copies from him.  Pt allergic to statins with angioedema with crestor.

## 2013-11-20 NOTE — Assessment & Plan Note (Addendum)
Peripheral vascular occlusive disease status post bilateral carotid endarterectomies performed by Dr. Erskin Burnet in 2007. Would follow this by duplex ultrasound. He also has had bilateral SFA intervention as well as right renal artery stenting in the past. Because of progressive left greater than right lower decortication we obtain followup Doppler studies on him 03/02/13 revealed a right ankle brachial index of 0.93 and a left 0.82. He had a high-frequency signal in his distal left popliteal artery.  03/30/13  Successful PTA OF ABOVE THE KNEE HIGH GRADE POPLITEAL STENOSIS.  Follow up dopplers with improved ABI, Lt 0.85  Rt 0.97.  No claudication.

## 2013-11-20 NOTE — Patient Instructions (Addendum)
Your physician wants you to follow-up in: 6 months with Dr. Gwenlyn Found. You will receive a reminder letter in the mail two months in advance. If you don't receive a letter, please call our office to schedule the follow-up appointment.  Faxed request for lab work to Dr. Doristine Devoid office on 11/20/13

## 2013-11-20 NOTE — Assessment & Plan Note (Addendum)
Mildly elevated

## 2013-11-20 NOTE — Assessment & Plan Note (Signed)
Followed by Dr. Alyson Ingles, Will call and adk for lipid panel.  Pt with angioedema with statins.

## 2013-11-20 NOTE — Progress Notes (Signed)
11/20/2013   PCP: Thressa Sheller, MD   Chief Complaint  Patient presents with  . 6 month ROV    no complaints; plavix & prilosec ?    Primary Cardiologist: Dr. Gwenlyn Found  HPI:  67 year old thin appearing married Caucasian male with no children who is followed by Dr. Gwenlyn Found. He has a history of PVOD status post bilateral carotid endarterectomies performed by Dr. Drucie Opitz in 2007. We have been following him by duplex ultrasound which performed most recently several days ago revealed this to be widely patent. He has also had bilateral SFA intervention as well as right renal artery stenting. His other problems include hypertension and hyperlipidemia. He did have an episode of chest heaviness recently, but more importantly he has complained of progressive lifestyle limiting claudication since I last saw him. Most recent lower extremity Dopplers reveal a right ABI of 0.93 and a left of 0.82. He does appear to have moderate iliac disease as well as high grade right SFA and popliteal stenosis. He underwent abdominal aortography with bifemoral runoff on 03/30/13 revealed a widely patent right renal artery stent, 50-60% proximal left renal artery stenosis. The left SFA was while he stayed patent and he had a 95% focal above-the-knee popliteal artery stenosis with three-vessel runoff. There was a 60% focal lesion in the right common iliac artery and a 50% segmental proximal to mid right SFA stenosis with a patent stent in the mid to distal right SFA. He also had a 50% right popliteal stenosis with three-vessel runoff. He underwent cutting balloon angioplasty of the left above-the-knee popliteal artery and Angiosculpt balloon with an excellent angiographic result and was discharged the following day. Since discharge he has no claudication.  Today we reviewed his stress test from 03/26/13 which revealed no ischemia.   His PCP follow chol. We will ask for a copy.  He denies claudication, + cold feet  at night.  + baker's cyst he is thinking of having surgery.     Allergies  Allergen Reactions  . Statins Other (See Comments)    unspecified    Current Outpatient Prescriptions  Medication Sig Dispense Refill  . Biotin 1000 MCG tablet Take 1,000 mcg by mouth 3 (three) times daily.      . Cholecalciferol (VITAMIN D PO) Take by mouth daily.      . clopidogrel (PLAVIX) 75 MG tablet Take 1 tablet (75 mg total) by mouth daily.  30 tablet  5  . diltiazem (DILACOR XR) 240 MG 24 hr capsule Take 1 capsule (240 mg total) by mouth daily.  30 capsule  5  . Glucosamine-Chondroitin (OSTEO BI-FLEX REGULAR STRENGTH PO) Take 1 tablet by mouth daily.      [name] losartan-hydrochlorothiazide (HYZAAR) 100-12.5 MG per tablet TAKE ONE TABLET BY MOUTH EVERY DAY  90 tablet  1  . omeprazole (PRILOSEC OTC) 20 MG tablet Take 20 mg by mouth daily.       No current facility-administered medications for this visit.    Past Medical History  Diagnosis Date  . Peripheral vascular disease   . Hypertension 03-30-2013    PV Angiogram- rt. renal artery stent was widely open,50-60% lt. renal artery stenosis,lt. SFA stents patent with high grade above the knee  poplital stenosis  . GERD (gastroesophageal reflux disease)   . Kidney stones   . Hyperlipemia   . History of echocardiogram 08-02-2005    normal study   . Normal cardiac stress test 03-25-2013  EF 60%, normal stress test ,this is a presurgical  test  . Renal artery stenosis 03-02-2013    renal artery doppler-this was an abnormal doppler  . Cerebral atherosclerosis 03-02-2013    carotid duplex- normal study  . Bilateral calf pain 03-02-2013    lower ext.doppler-mild arterial insufficiency to lower ext. this is a disease in val  . H/O cardiac catheterization 10-25-2008    5 french tennis racket catheter was used for midstream and distal abd. aortography with bifemeral runoff using digital subtraction bolus chase step-table technique. PTA was performed with 4x4  cordis powerflex,stenting with a 6x6 bard nitinol self expanding stent.reduction of distal SFA stenosis from 80 to 0 % with a 5x4 bard rival balloon     Past Surgical History  Procedure Laterality Date  . Popliteal artery angioplasty Left 03/30/2013  . Carotid endarterectomy      AVW:UJWJXBJ:YN colds or fevers, no weight changes Skin:no rashes or ulcers HEENT:no blurred vision, no congestion CV:see HPI PUL:see HPI GI:no diarrhea constipation or melena, no indigestion GU:no hematuria, no dysuria, trouble with urination, plans to go back on flomax when he sees PCP. MS:+ joint pain due to Baker's cyst, no claudication Neuro:no syncope, no lightheadedness Endo:no diabetes, no thyroid disease  PHYSICAL EXAM BP 146/82  Pulse 58  Ht $R'5\' 8"'My$  (1.727 m)  Wt 158 lb 6.4 oz (71.85 kg)  BMI 24.09 kg/m2 General:Pleasant affect, NAD Skin:Warm and dry, brisk capillary refill, feet are equally warm to mildly cool but pink. HEENT:normocephalic, sclera clear, mucus membranes moist Neck:supple, no JVD, no bruits  Heart:S1S2 RRR without murmur, gallup, rub or click Lungs:clear without rales, rhonchi, or wheezes WGN:FAOZ, non tender, + BS, do not palpate liver spleen or masses Ext:no lower ext edema, 1+ pedal pulses, 2+ radial pulses Neuro:alert and oriented, MAE, follows commands, + facial symmetry  EKG:SB at 58 no acute EKG changes.  ASSESSMENT AND PLAN PVD (peripheral vascular disease) Peripheral vascular occlusive disease status post bilateral carotid endarterectomies performed by Dr. Erskin Burnet in 2007. Would follow this by duplex ultrasound. He also has had bilateral SFA intervention as well as right renal artery stenting in the past. Because of progressive left greater than right lower decortication we obtain followup Doppler studies on him 03/02/13 revealed a right ankle brachial index of 0.93 and a left 0.82. He had a high-frequency signal in his distal left popliteal artery.    Hypertension controlled  HLD (hyperlipidemia) Followed by PCP, we will get copies from him.  Pt allergic to statins with angioedema with crestor.   Pt will follow up with Dr. Gwenlyn Found in 6 months.

## 2013-12-08 ENCOUNTER — Telehealth (HOSPITAL_COMMUNITY): Payer: Self-pay | Admitting: *Deleted

## 2013-12-14 ENCOUNTER — Other Ambulatory Visit (HOSPITAL_COMMUNITY): Payer: Self-pay | Admitting: Cardiovascular Disease

## 2013-12-14 DIAGNOSIS — I739 Peripheral vascular disease, unspecified: Secondary | ICD-10-CM

## 2013-12-21 ENCOUNTER — Ambulatory Visit (HOSPITAL_COMMUNITY)
Admission: RE | Admit: 2013-12-21 | Discharge: 2013-12-21 | Disposition: A | Discharge status: Home / Self Care | Payer: Medicare Other | Source: Ambulatory Visit | Attending: Cardiology | Admitting: Cardiology

## 2013-12-21 DIAGNOSIS — I739 Peripheral vascular disease, unspecified: Secondary | ICD-10-CM

## 2013-12-21 DIAGNOSIS — I70219 Atherosclerosis of native arteries of extremities with intermittent claudication, unspecified extremity: Secondary | ICD-10-CM

## 2013-12-21 NOTE — Progress Notes (Signed)
Arterial Duplex Lower Ext. Completed. Myda Detwiler, BS, RDMS, RVT  

## 2014-01-09 ENCOUNTER — Other Ambulatory Visit: Payer: Self-pay | Admitting: Cardiovascular Disease

## 2014-01-11 NOTE — Telephone Encounter (Signed)
Rx was sent to pharmacy electronically. 

## 2014-02-06 ENCOUNTER — Other Ambulatory Visit: Payer: Self-pay | Admitting: Cardiovascular Disease

## 2014-02-06 NOTE — Telephone Encounter (Signed)
Rx was sent to pharmacy electronically. 

## 2014-05-07 ENCOUNTER — Other Ambulatory Visit (HOSPITAL_COMMUNITY): Payer: Self-pay | Admitting: Cardiovascular Disease

## 2014-05-07 NOTE — Telephone Encounter (Signed)
Rx refilled to patient pharmacy

## 2014-05-28 ENCOUNTER — Other Ambulatory Visit: Payer: Self-pay | Admitting: Internal Medicine

## 2014-05-28 DIAGNOSIS — R748 Abnormal levels of other serum enzymes: Secondary | ICD-10-CM

## 2014-06-03 ENCOUNTER — Ambulatory Visit
Admission: RE | Admit: 2014-06-03 | Discharge: 2014-06-03 | Disposition: A | Discharge status: Home / Self Care | Payer: Medicare Other | Source: Ambulatory Visit | Attending: Internal Medicine | Admitting: Internal Medicine

## 2014-06-03 DIAGNOSIS — R748 Abnormal levels of other serum enzymes: Secondary | ICD-10-CM

## 2014-06-03 IMAGING — US US ABDOMEN LIMITED
1 series · 14 of 25 positions shown · non-contrast
Comparison: None.

CLINICAL DATA: Abnormal alkaline phosphatase level.

EXAM:
US ABDOMEN LIMITED - RIGHT UPPER QUADRANT

[Series 1: us abdomen limited · 0.31mm/px · 14 of 42 slices shown]
[im 1/42]
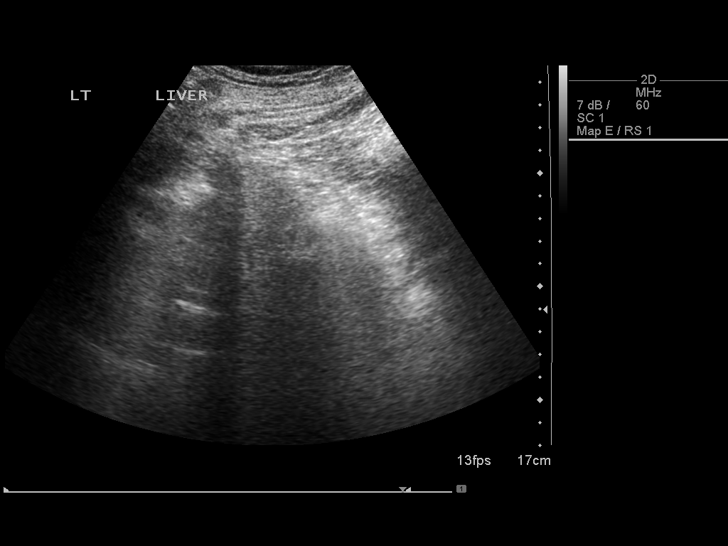
[im 4/42]
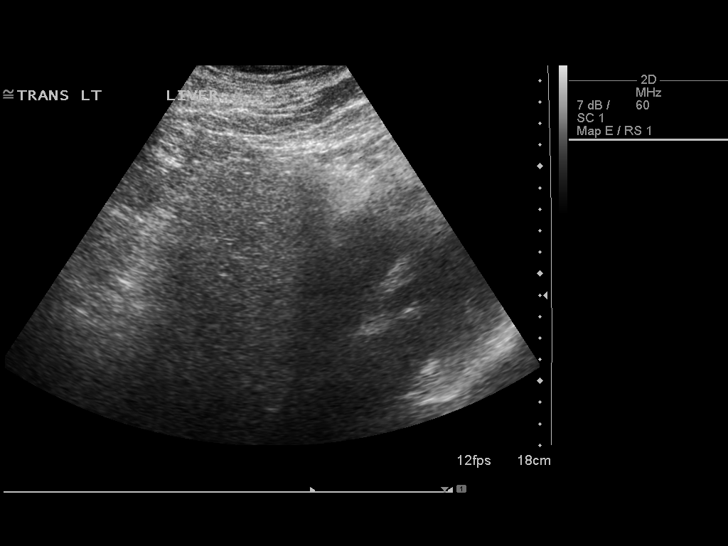
[im 7/42]
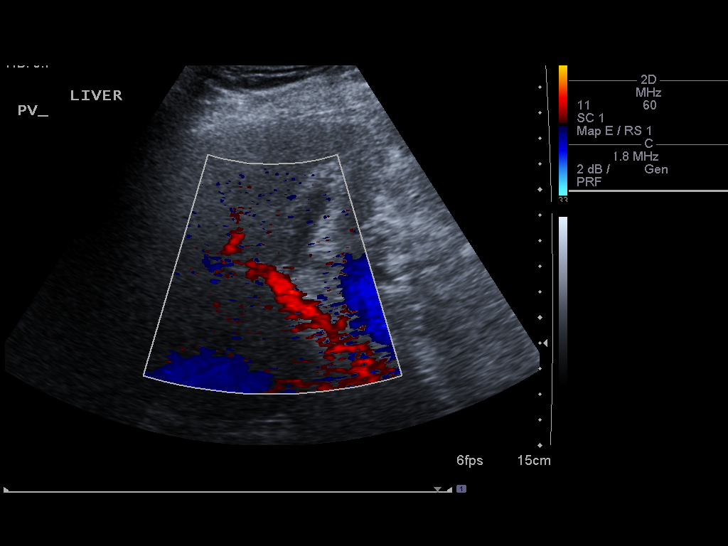
[im 11/42]
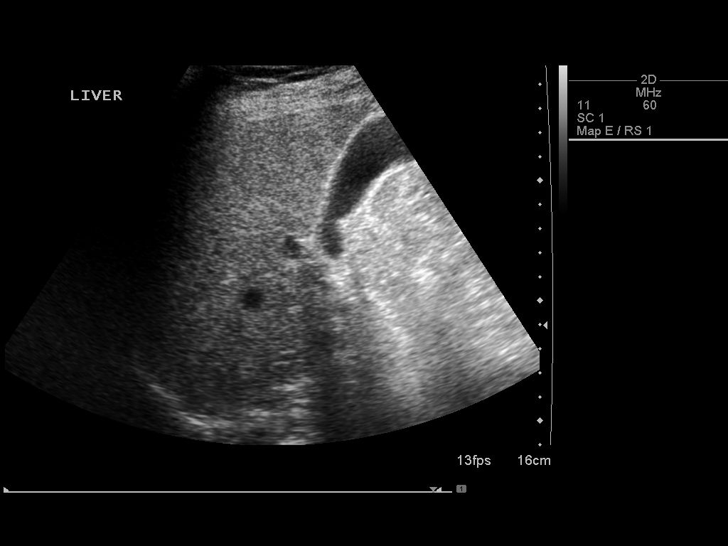
[im 14/42]
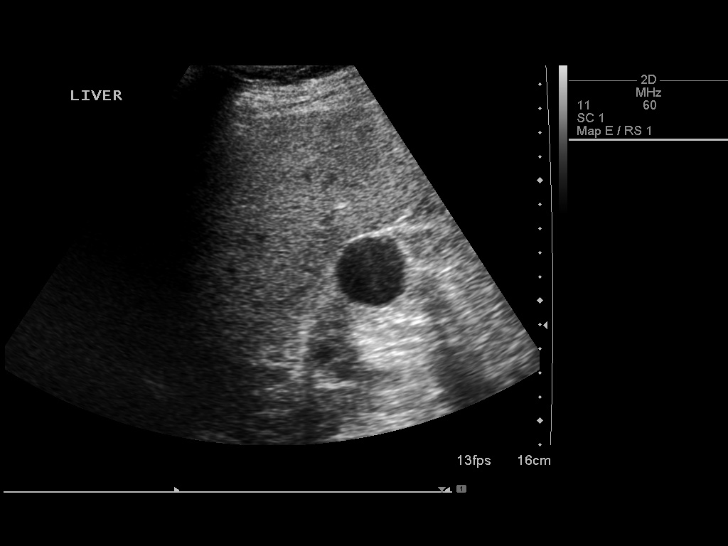
[im 16/42]
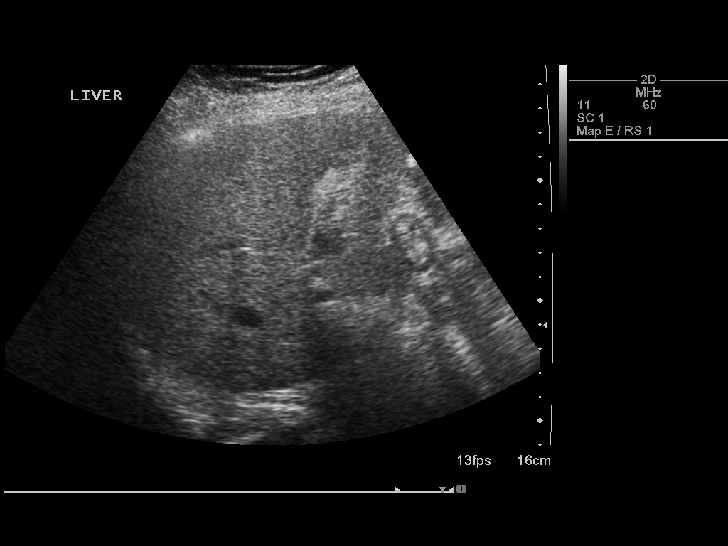
[im 19/42]
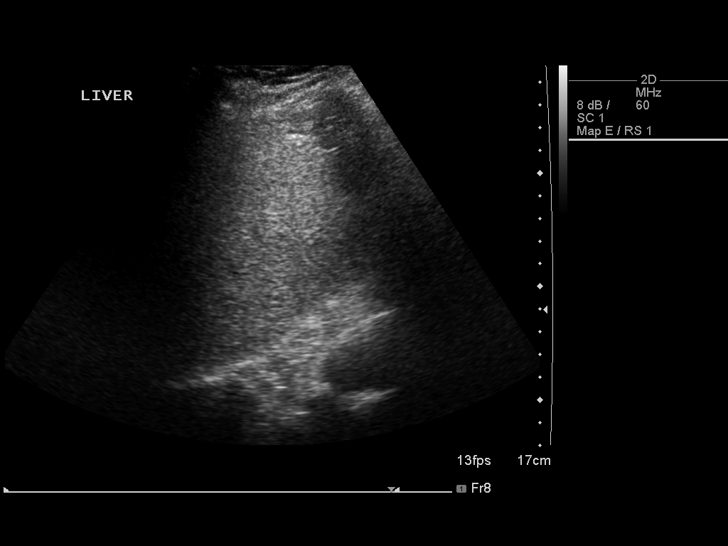
[im 23/42]
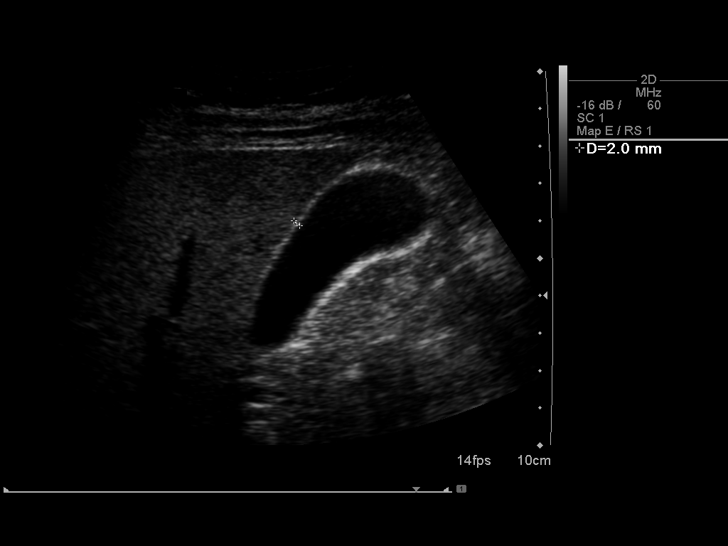
[im 26/42]
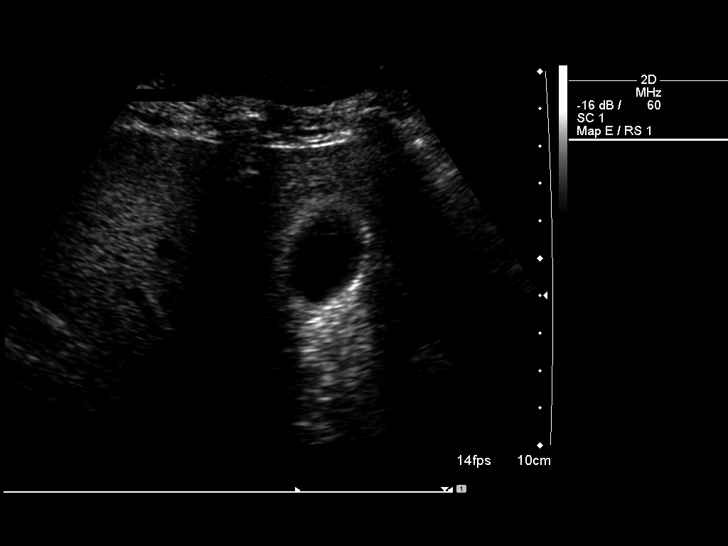
[im 28/42]
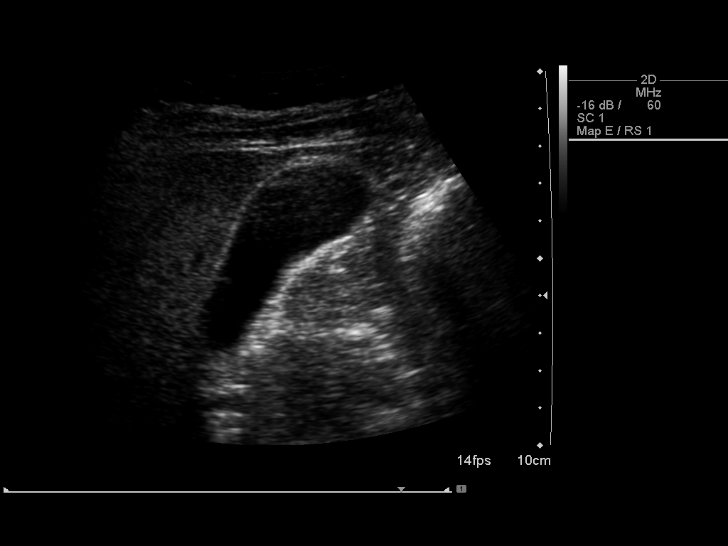
[im 31/42]
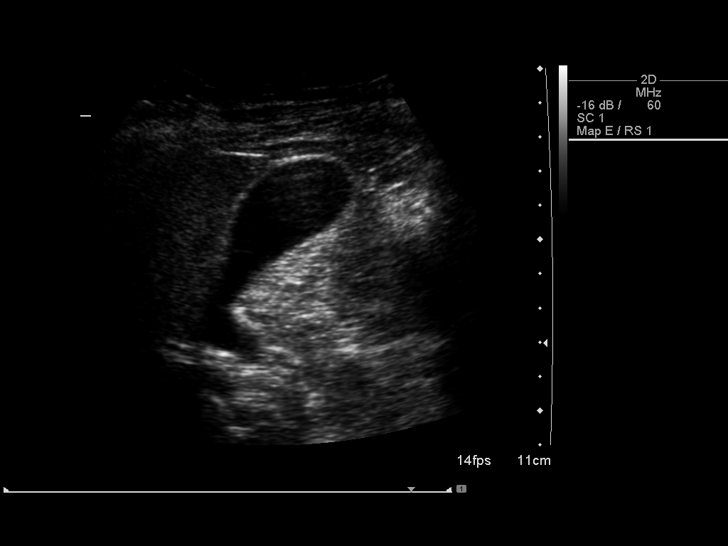
[im 35/42]
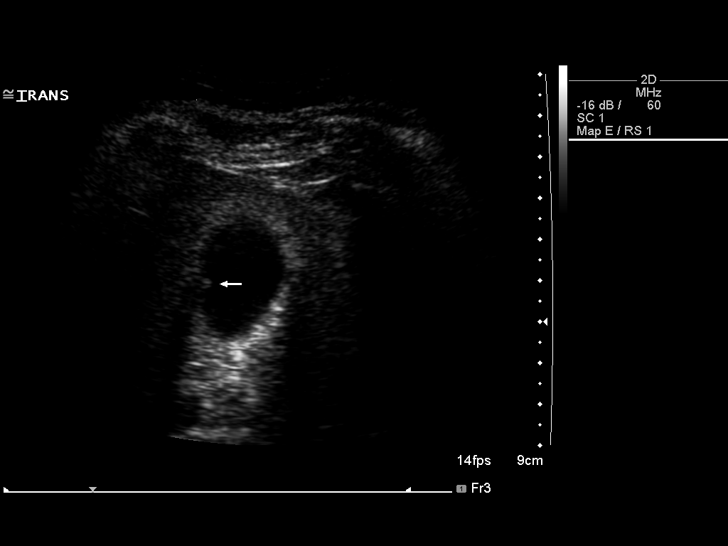
[im 38/42]
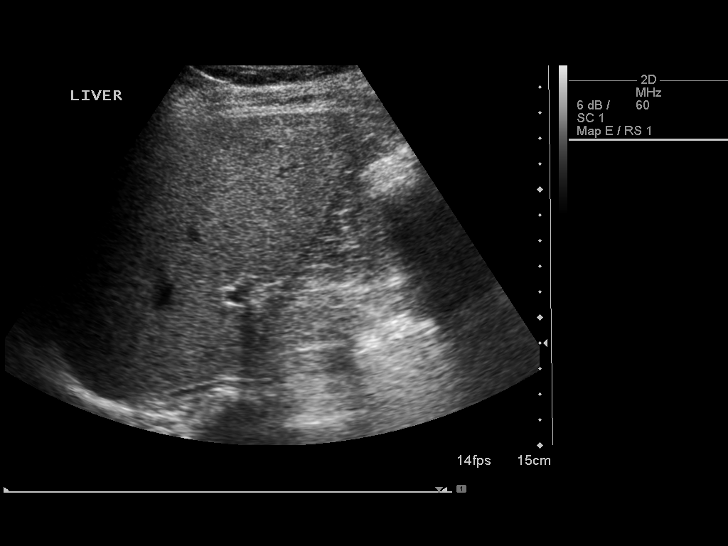
[im 42/42]
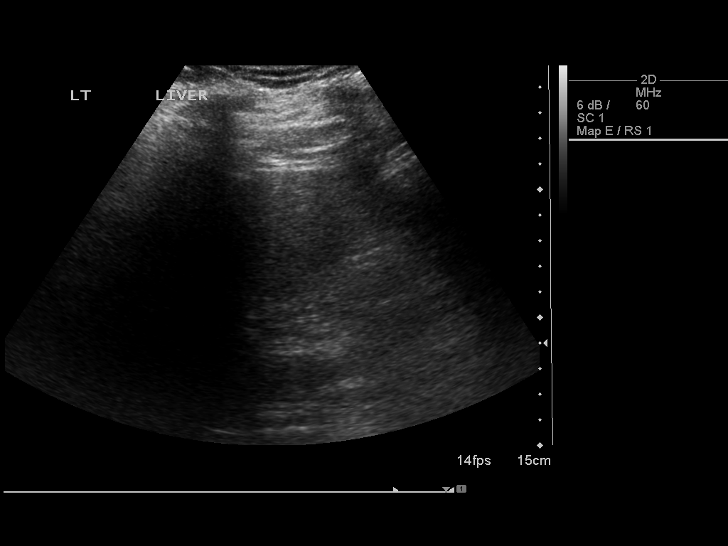

[14 of 25 positions shown; findings below may reference images not displayed]

FINDINGS: Gallbladder:

No stones or wall thickening. Negative Murphy sign. 3 mm gallbladder
polyp is noted on the nondependent wall.

Common bile duct:

Diameter: 2 mm in caliber.

Liver:

Mildly increased echogenicity. Left lobe was poorly visualized. No
mass.

Additional findings: 3.0 cm simple cyst in the upper pole of the
right kidney.
IMPRESSION: Mildly increased echogenicity throughout the liver likely due to
diffuse hepatic steatosis. Diffuse hepatic parenchymal disease can
have a similar appearance.

Benign-appearing small gallbladder polyp.

## 2014-06-17 ENCOUNTER — Telehealth (HOSPITAL_COMMUNITY): Payer: Self-pay | Admitting: *Deleted

## 2014-07-01 ENCOUNTER — Telehealth (HOSPITAL_COMMUNITY): Payer: Self-pay | Admitting: *Deleted

## 2014-07-01 ENCOUNTER — Other Ambulatory Visit (HOSPITAL_COMMUNITY): Payer: Self-pay | Admitting: Cardiovascular Disease

## 2014-07-01 ENCOUNTER — Encounter (HOSPITAL_COMMUNITY): Payer: Self-pay | Admitting: *Deleted

## 2014-07-01 DIAGNOSIS — I739 Peripheral vascular disease, unspecified: Secondary | ICD-10-CM

## 2014-07-24 ENCOUNTER — Other Ambulatory Visit (HOSPITAL_COMMUNITY): Payer: Self-pay | Admitting: Cardiovascular Disease

## 2014-07-26 NOTE — Telephone Encounter (Signed)
Rx refill sent to patient pharmacy   

## 2014-08-10 ENCOUNTER — Ambulatory Visit (HOSPITAL_COMMUNITY)
Admission: RE | Admit: 2014-08-10 | Discharge: 2014-08-10 | Disposition: A | Discharge status: Home / Self Care | Payer: Medicare Other | Source: Ambulatory Visit | Attending: Cardiology | Admitting: Cardiology

## 2014-08-10 DIAGNOSIS — I739 Peripheral vascular disease, unspecified: Secondary | ICD-10-CM | POA: Insufficient documentation

## 2014-08-10 NOTE — Progress Notes (Signed)
Lower Extremity Arterial Duplex Completed. °Brianna L Mazza,RVT °

## 2014-08-13 ENCOUNTER — Telehealth: Payer: Self-pay | Admitting: *Deleted

## 2014-08-13 NOTE — Telephone Encounter (Signed)
Message copied by Chauncy Lean on Fri Aug 13, 2014 11:17 AM ------      Message from: Lorretta Harp      Created: Thu Aug 12, 2014  5:42 PM       Return office visit with me to discuss results at next available. Abn LEA ------

## 2014-08-17 NOTE — Telephone Encounter (Signed)
Returning your call,concerning his test results,

## 2014-08-17 NOTE — Telephone Encounter (Signed)
Spoke with patient and gave doppler results

## 2014-08-18 ENCOUNTER — Ambulatory Visit (INDEPENDENT_AMBULATORY_CARE_PROVIDER_SITE_OTHER): Payer: Medicare Other | Admitting: Cardiovascular Disease

## 2014-08-18 ENCOUNTER — Encounter: Payer: Self-pay | Admitting: Cardiovascular Disease

## 2014-08-18 VITALS — BP 112/62 | HR 60 | Ht 67.5 in | Wt 167.0 lb

## 2014-08-18 DIAGNOSIS — Z79899 Other long term (current) drug therapy: Secondary | ICD-10-CM

## 2014-08-18 DIAGNOSIS — R5383 Other fatigue: Secondary | ICD-10-CM

## 2014-08-18 DIAGNOSIS — D689 Coagulation defect, unspecified: Secondary | ICD-10-CM

## 2014-08-18 DIAGNOSIS — I739 Peripheral vascular disease, unspecified: Secondary | ICD-10-CM

## 2014-08-18 DIAGNOSIS — I10 Essential (primary) hypertension: Secondary | ICD-10-CM

## 2014-08-18 DIAGNOSIS — Z01818 Encounter for other preprocedural examination: Secondary | ICD-10-CM

## 2014-08-18 DIAGNOSIS — R5381 Other malaise: Secondary | ICD-10-CM

## 2014-08-18 NOTE — Assessment & Plan Note (Signed)
On statin therapy followed by his PCP 

## 2014-08-18 NOTE — Progress Notes (Signed)
08/18/2014 Joshua Wyatt   July 09, 1946  [id number]  Primary Physician Thressa Sheller, MD Primary Cardiologist: Lorretta Harp MD Renae Gloss   HPI:  The patient is a 68 year old thin appearing married Caucasian male with no children who I last saw in the office 16 months ago. He has a history of PVOD status post bilateral carotid endarterectomies performed by Dr. Drucie Opitz in 2007. We have been following him by duplex ultrasound which performed most recently several days ago revealed this to be widely patent. He has also had bilateral SFA intervention as well as right renal artery stenting. His other problems include hypertension and hyperlipidemia. He did have an episode of chest heaviness recently, but more importantly he has complained of progressive lifestyle limiting claudication since I last saw him. Most recent lower extremity Dopplers reveal a right ABI of 0.93 and a left of 0.82. He does appear to have moderate iliac disease as well as high grade right SFA and popliteal stenosis. He underwent abdominal aortography with bifemoral runoff on 03/30/13 revealed a widely patent right renal artery stent, 50-60% proximal left renal artery stenosis. The left SFA was while he stayed patent and he had a 95% focal above-the-knee popliteal artery stenosis with three-vessel runoff. There was a 60% focal lesion in the right common iliac artery and a 50% segmental proximal to mid right SFA stenosis with a patent stent in the mid to distal right SFA. He also had a 50% right popliteal stenosis with three-vessel runoff. He underwent cutting balloon angioplasty of the left above-the-knee popliteal artery and Angiosculpt balloon with an excellent angiographic result and was discharged the following day. He has complained of bilateral calf claudication right greater than left which is lifestyle limiting. He denies chest pain or shortness of breath.    Current Outpatient Prescriptions  Medication  Sig Dispense Refill  . Cholecalciferol (VITAMIN D PO) Take by mouth daily.      . clopidogrel (PLAVIX) 75 MG tablet TAKE ONE TABLET BY MOUTH ONCE DAILY  30 tablet  9  . diltiazem (DILACOR XR) 240 MG 24 hr capsule TAKE ONE CAPSULE BY MOUTH ONCE DAILY  30 capsule  10  . Glucosamine-Chondroitin (OSTEO BI-FLEX REGULAR STRENGTH PO) Take 1 tablet by mouth daily.      [name] losartan-hydrochlorothiazide (HYZAAR) 100-12.5 MG per tablet TAKE ONE TABLET BY MOUTH ONCE DAILY  90 tablet  1  . omeprazole (PRILOSEC OTC) 20 MG tablet Take 20 mg by mouth daily.      [name] OVER THE COUNTER MEDICATION Take 2 capsules by mouth 2 (two) times daily. Cholest-Off       No current facility-administered medications for this visit.    Allergies  Allergen Reactions  . Statins Other (See Comments)    unspecified    History   Social History  . Marital Status: Married    Spouse Name: N/A    Number of Children: N/A  . Years of Education: N/A   Occupational History  . Not on file.   Social History Main Topics  . Smoking status: Former Smoker    Types: Cigars    Quit date: 10/18/2013  . Smokeless tobacco: Never Used  . Alcohol Use: No  . Drug Use: No  . Sexual Activity: Not on file     Comment: RARE  CIGAR   Other Topics Concern  . Not on file   Social History Narrative  . No narrative on file     Review of Systems: General:  negative for chills, fever, night sweats or weight changes.  Cardiovascular: negative for chest pain, dyspnea on exertion, edema, orthopnea, palpitations, paroxysmal nocturnal dyspnea or shortness of breath Dermatological: negative for rash Respiratory: negative for cough or wheezing Urologic: negative for hematuria Abdominal: negative for nausea, vomiting, diarrhea, bright red blood per rectum, melena, or hematemesis Neurologic: negative for visual changes, syncope, or dizziness All other systems reviewed and are otherwise negative except as noted above.    Blood pressure 112/62,  pulse 60, height 5' 7.5" (1.715 m), weight 167 lb (75.751 kg).  General appearance: alert and no distress Neck: no adenopathy, no JVD, supple, symmetrical, trachea midline, thyroid not enlarged, symmetric, no tenderness/mass/nodules and soft bilateral carotid bruits Lungs: clear to auscultation bilaterally Heart: regular rate and rhythm, S1, S2 normal, no murmur, click, rub or gallop Extremities: extremities normal, atraumatic, no cyanosis or edema  EKG sinus rhythm at 60 with no ST or T wave changes  ASSESSMENT AND PLAN:   PVD (peripheral vascular disease) History of bilateral carotid endarterectomies in 2007 performed by Dr. Amedeo Plenty. Carotid Doppler from last year showed these to be widely patent. I stented his right renal and right SFA back in 2010 and intervene on his left popliteal artery/21/14. Recent Dopplers have shown significant progression of disease in the right SFA with decrease in his ABI from 0.73-0.23. He does complain of bilateral calf claudication right greater than left. I'm going to arrange for him to undergo angiography and intervention.  Hypertension Controlled on current medications  HLD (hyperlipidemia) On statin therapy followed by his PCP      Lorretta Harp MD [facility], [facility] 08/18/2014 2:31 PM

## 2014-08-18 NOTE — Assessment & Plan Note (Signed)
Controlled on current medications 

## 2014-08-18 NOTE — Patient Instructions (Addendum)
Dr. Gwenlyn Found has ordered a peripheral angiogram to be done at [facility] in [place].  This procedure is going to look at the bloodflow in your lower extremities.  If Dr. Gwenlyn Found is able to open up the arteries, you will have to spend one night in the hospital.  If he is not able to open the arteries, you will be able to go home that same day.    After the procedure, you will not be allowed to drive for 3 days or push, pull, or lift anything greater than 10 lbs for one week.    You will be required to have the following tests prior to the procedure:  1. Blood work-the blood work can be done no more than 7 days prior to the procedure.  It can be done at any [facility] lab.  There is one downstairs on the first floor of this building and one in the [place] ([address])  2. Chest Xray-the chest xray order has already been placed at the [facility].     *REPS - [place] ([employer])

## 2014-08-18 NOTE — Assessment & Plan Note (Signed)
History of bilateral carotid endarterectomies in 2007 performed by Dr. Amedeo Plenty. Carotid Doppler from last year showed these to be widely patent. I stented his right renal and right SFA back in 2010 and intervene on his left popliteal artery/21/14. Recent Dopplers have shown significant progression of disease in the right SFA with decrease in his ABI from 0.73-0.23. He does complain of bilateral calf claudication right greater than left. I'm going to arrange for him to undergo angiography and intervention.

## 2014-08-23 ENCOUNTER — Telehealth: Payer: Self-pay | Admitting: Cardiovascular Disease

## 2014-08-23 ENCOUNTER — Encounter: Payer: Self-pay | Admitting: Cardiovascular Disease

## 2014-08-23 NOTE — Telephone Encounter (Signed)
Spoke with patient regarding lower extremity angiogram scheduled for 10/11/14.  Patient coming in mid October for work up---I will mail instruction letter to him.  Patient voiced unerstanding.

## 2014-08-23 NOTE — Telephone Encounter (Signed)
Spoke with Nicki Reaper regarding case scheduled for Monday 10/11/14 @ 7:30 am.  Nicki Reaper voiced his understanding.

## 2014-08-31 ENCOUNTER — Ambulatory Visit: Payer: Medicare Other | Admitting: Cardiovascular Disease

## 2014-09-28 ENCOUNTER — Encounter (HOSPITAL_COMMUNITY): Payer: Self-pay | Admitting: Pharmacy Technician

## 2014-09-29 ENCOUNTER — Ambulatory Visit (INDEPENDENT_AMBULATORY_CARE_PROVIDER_SITE_OTHER): Payer: Medicare Other | Admitting: Cardiology

## 2014-09-29 ENCOUNTER — Encounter: Payer: Self-pay | Admitting: Cardiology

## 2014-09-29 VITALS — BP 184/92 | HR 59 | Ht 67.0 in | Wt 165.7 lb

## 2014-09-29 DIAGNOSIS — I739 Peripheral vascular disease, unspecified: Secondary | ICD-10-CM

## 2014-09-29 DIAGNOSIS — I10 Essential (primary) hypertension: Secondary | ICD-10-CM

## 2014-09-29 DIAGNOSIS — E785 Hyperlipidemia, unspecified: Secondary | ICD-10-CM

## 2014-09-29 MED ORDER — DOXAZOSIN MESYLATE 2 MG PO TABS: 2.0000 mg | ORAL_TABLET | Freq: Every day | ORAL | Status: DC

## 2014-09-29 NOTE — Patient Instructions (Signed)
Start Doxazosin 2 mg before bedtime daily. Keep apt appointment for LE angiogram 10/11/14.

## 2014-09-29 NOTE — Assessment & Plan Note (Signed)
Abnormal LEA dopplers 08/10/14. Pt scheduled for LEA 10/11/14.

## 2014-09-29 NOTE — Progress Notes (Signed)
09/29/2014 Joshua Wyatt   Jul 07, 1946  [id number]  Primary Physicia Thressa Sheller, MD Primary Cardiologist: Dr Gwenlyn Found  HPI:  68 year old  married Caucasian male with no children who is followed by Dr. Gwenlyn Found. He has PVD, no history of CAD. He had a negative Myoview April 2014. He has a history of PVOD and is  status post bilateral carotid endarterectomies performed by Dr. Drucie Opitz in 2007. We have been following him by duplex ultrasound which have revealed this to be widely patent. He has also had bilateral SFA intervention as well as right renal artery stenting. Renal duplex in March 2014 revealed a patent Rt RA stent and suggested some Lt RA stenosis, thought the ratio was less than 3. This was unchanged from the previous study.          He recently developed Rt > Lt LE claudication. Dopplers done 08/10/14 reveled progression of LE disease. His ABI was .23 on Rt and .65 on the Lt. He saw Dr Gwenlyn Found 08/18/14 and is scheduled for LEA Nov 2nd 2015. Since he saw Dr Gwenlyn Found he has done well. Hew denies chest pain or dyspnea. He continues to have Rt > Lt claudication. He reports his B/P is "116" at home but to day it was > 683 systolic when he was checked by the RN and 162/90 when I re checked it.     Current Outpatient Prescriptions  Medication Sig Dispense Refill  . aspirin EC 81 MG tablet Take 81 mg by mouth daily.      . Biotin 1000 MCG tablet Take 1,000 mcg by mouth daily.      . clopidogrel (PLAVIX) 75 MG tablet Take 75 mg by mouth daily.      [name] diltiazem (DILACOR XR) 240 MG 24 hr capsule Take 240 mg by mouth daily.      [name] losartan-hydrochlorothiazide (HYZAAR) 100-12.5 MG per tablet Take 1 tablet by mouth daily.      [name] omeprazole (PRILOSEC OTC) 20 MG tablet Take 20 mg by mouth daily.      [name] doxazosin (CARDURA) 2 MG tablet Take 1 tablet (2 mg total) by mouth at bedtime.  30 tablet  11   No current facility-administered medications for this visit.    Allergies  Allergen Reactions  .  Statins Other (See Comments)    unspecified    History   Social History  . Marital Status: Married    Spouse Name: N/A    Number of Children: N/A  . Years of Education: N/A   Occupational History  . Not on file.   Social History Main Topics  . Smoking status: Former Smoker    Types: Cigars    Quit date: 10/18/2013  . Smokeless tobacco: Never Used  . Alcohol Use: No  . Drug Use: No  . Sexual Activity: Not on file     Comment: RARE  CIGAR   Other Topics Concern  . Not on file   Social History Narrative  . No narrative on file     Review of Systems: General: negative for chills, fever, night sweats or weight changes.  Cardiovascular: negative for chest pain, dyspnea on exertion, edema, orthopnea, palpitations, paroxysmal nocturnal dyspnea or shortness of breath Dermatological: negative for rash Respiratory: negative for cough or wheezing Urologic: negative for hematuria Abdominal: negative for nausea, vomiting, diarrhea, bright red blood per rectum, melena, or hematemesis Neurologic: negative for visual changes, syncope, or dizziness All other systems reviewed and are otherwise negative except as  noted above.    Blood pressure 184/92, pulse 59, height $RemoveBe'5\' 7"'NdxxUTVdP$  (1.702 m), weight 165 lb 11.2 oz (75.161 kg).  General appearance: alert, cooperative and no distress Neck: no JVD and Rt CA bruit. Recent mole removed Rt neck Lungs: clear to auscultation bilaterally Heart: regular rate and rhythm Abdomen: soft, non-tender; bowel sounds normal; no masses,  no organomegaly Extremities: extremities normal, atraumatic, no cyanosis or edema Pulses: diminnished LE pulses Skin: Skin color, texture, turgor normal. No rashes or lesions Neurologic: Grossly normal  EKG NSR  ASSESSMENT AND PLAN:   Claudication of left lower extremity R.L claudication  PVD (peripheral vascular disease) Abnormal LEA dopplers 08/10/14. Pt scheduled for LEA  10/11/14.  Hypertension Uncontrolled  Hyperlipidemia Statin intolerance   PLAN  I added Cardura 2 mg QHS. I reviewed his renal dopplers with Dr Gwenlyn Found. With his recent B/P elevation he will take a look at his renal arteries at the time of his PVA.   Joshua Wyatt KPA-C 09/29/2014 9:25 AM

## 2014-09-29 NOTE — Assessment & Plan Note (Signed)
Uncontrolled 

## 2014-09-29 NOTE — Assessment & Plan Note (Signed)
Joshua Wyatt claudication

## 2014-09-29 NOTE — Assessment & Plan Note (Signed)
Statin intolerance

## 2014-10-05 ENCOUNTER — Ambulatory Visit
Admission: RE | Admit: 2014-10-05 | Discharge: 2014-10-05 | Disposition: A | Discharge status: Home / Self Care | Payer: Medicare Other | Source: Ambulatory Visit | Attending: Cardiovascular Disease | Admitting: Cardiovascular Disease

## 2014-10-05 DIAGNOSIS — Z01818 Encounter for other preprocedural examination: Secondary | ICD-10-CM

## 2014-10-05 DIAGNOSIS — I739 Peripheral vascular disease, unspecified: Secondary | ICD-10-CM

## 2014-10-05 LAB — BASIC METABOLIC PANEL
BUN: 10 mg/dL (ref 6–23)
CALCIUM: 10.7 mg/dL — AB (ref 8.4–10.5)
CO2: 24 mEq/L (ref 19–32)
Chloride: 93 mEq/L — ABNORMAL LOW (ref 96–112)
Creat: 1.02 mg/dL (ref 0.50–1.35)
Glucose, Bld: 118 mg/dL — ABNORMAL HIGH (ref 70–99)
Potassium: 4.6 mEq/L (ref 3.5–5.3)
Sodium: 129 mEq/L — ABNORMAL LOW (ref 135–145)

## 2014-10-05 LAB — CBC
HCT: 37.6 % — ABNORMAL LOW (ref 39.0–52.0)
HEMOGLOBIN: 13.2 g/dL (ref 13.0–17.0)
MCH: 29.9 pg (ref 26.0–34.0)
MCHC: 35.1 g/dL (ref 30.0–36.0)
MCV: 85.3 fL (ref 78.0–100.0)
Platelets: 294 10*3/uL (ref 150–400)
RBC: 4.41 MIL/uL (ref 4.22–5.81)
RDW: 13.8 % (ref 11.5–15.5)
WBC: 6.1 10*3/uL (ref 4.0–10.5)

## 2014-10-05 LAB — PROTIME-INR
INR: 0.97 (ref <1.50)
PROTHROMBIN TIME: 12.9 s (ref 11.6–15.2)

## 2014-10-05 LAB — TSH: TSH: 0.68 u[IU]/mL (ref 0.350–4.500)

## 2014-10-05 LAB — APTT: aPTT: 29 seconds (ref 24–37)

## 2014-10-05 IMAGING — CR DG CHEST 2V
2 series · 2 of 2 positions shown · non-contrast
Comparison: [DATE]

CLINICAL DATA: Preop exam. For stent placement on [DATE].
History of smoking and hypertension. Prior history of stent
placement.

EXAM:
CHEST  2 VIEW

[w chest pa]
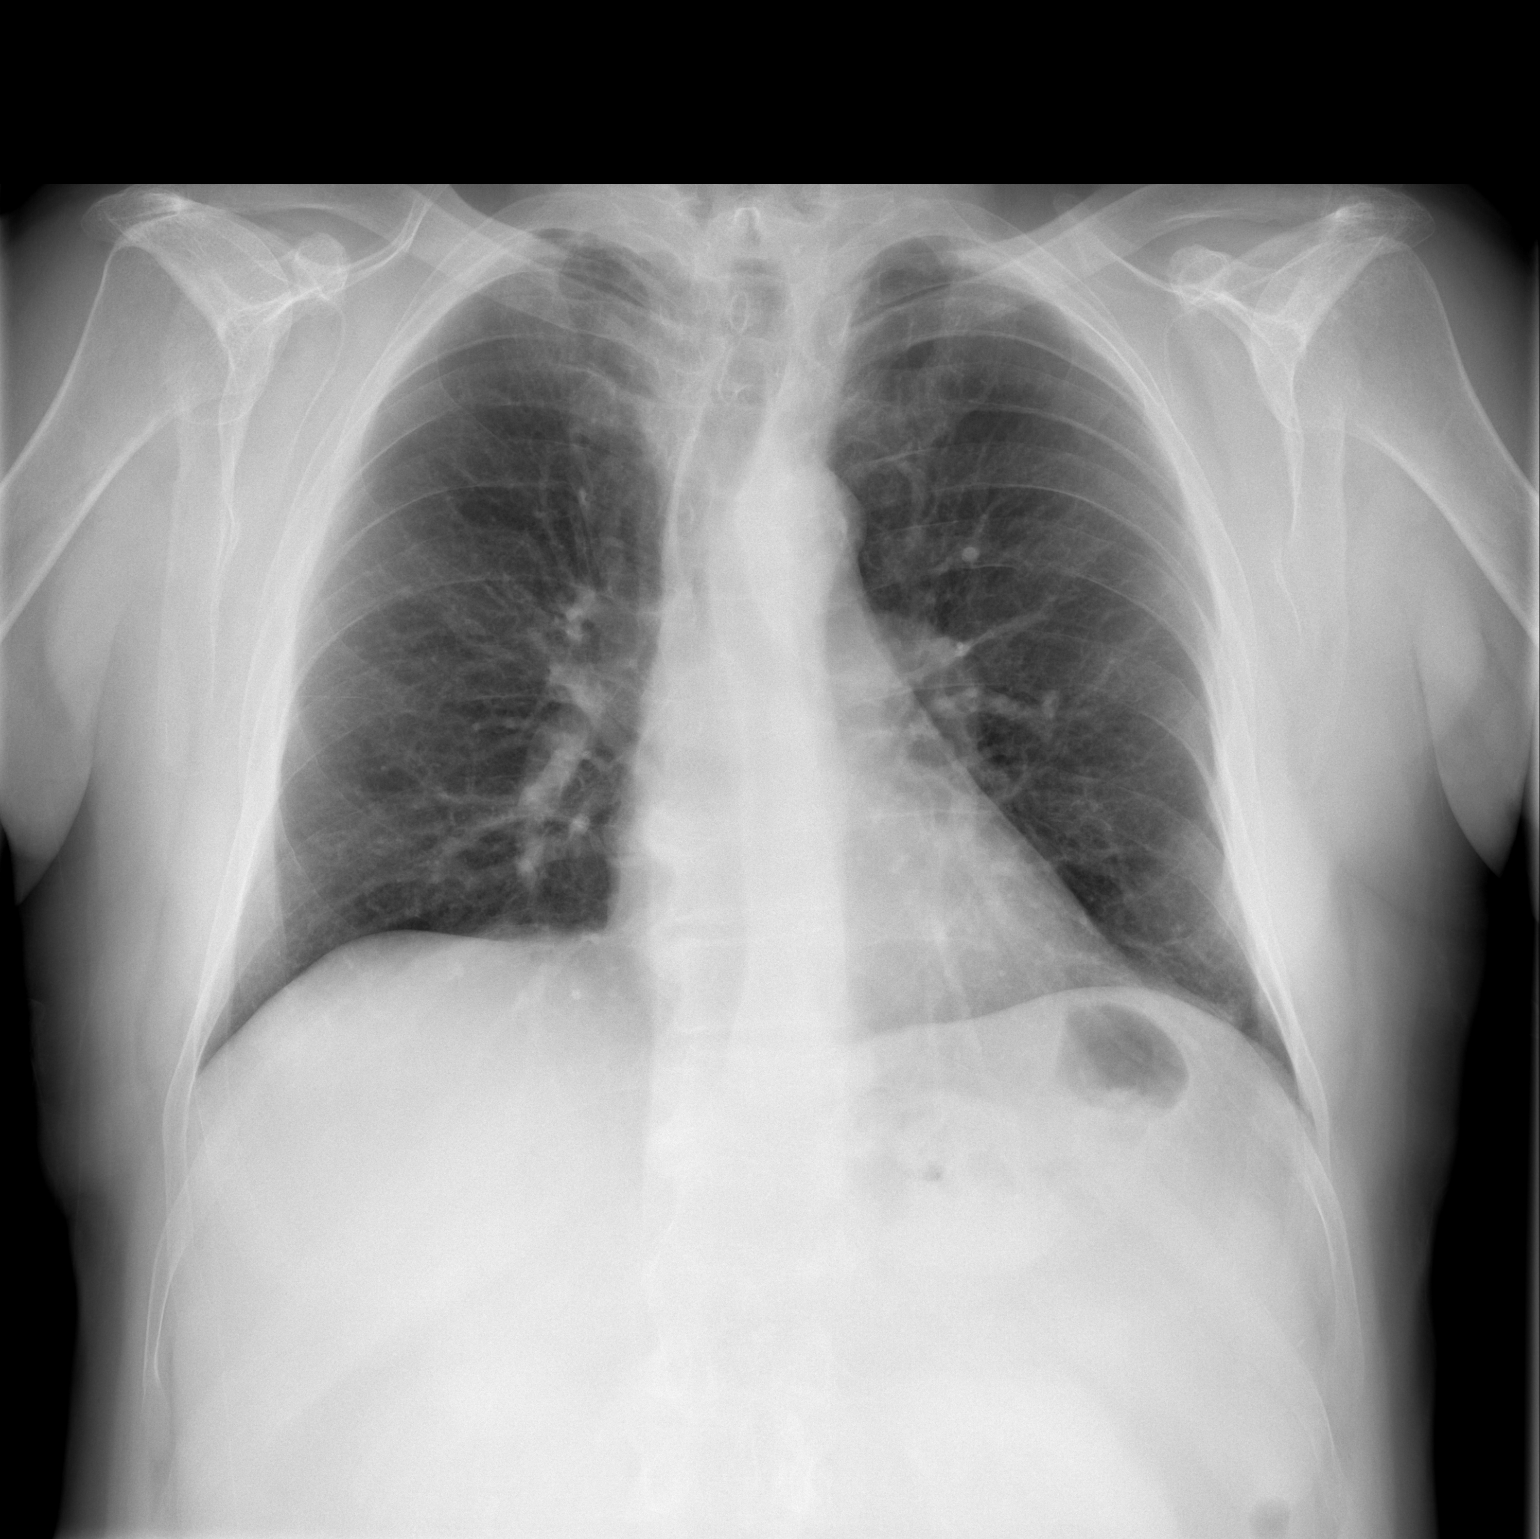

[w chest lat]
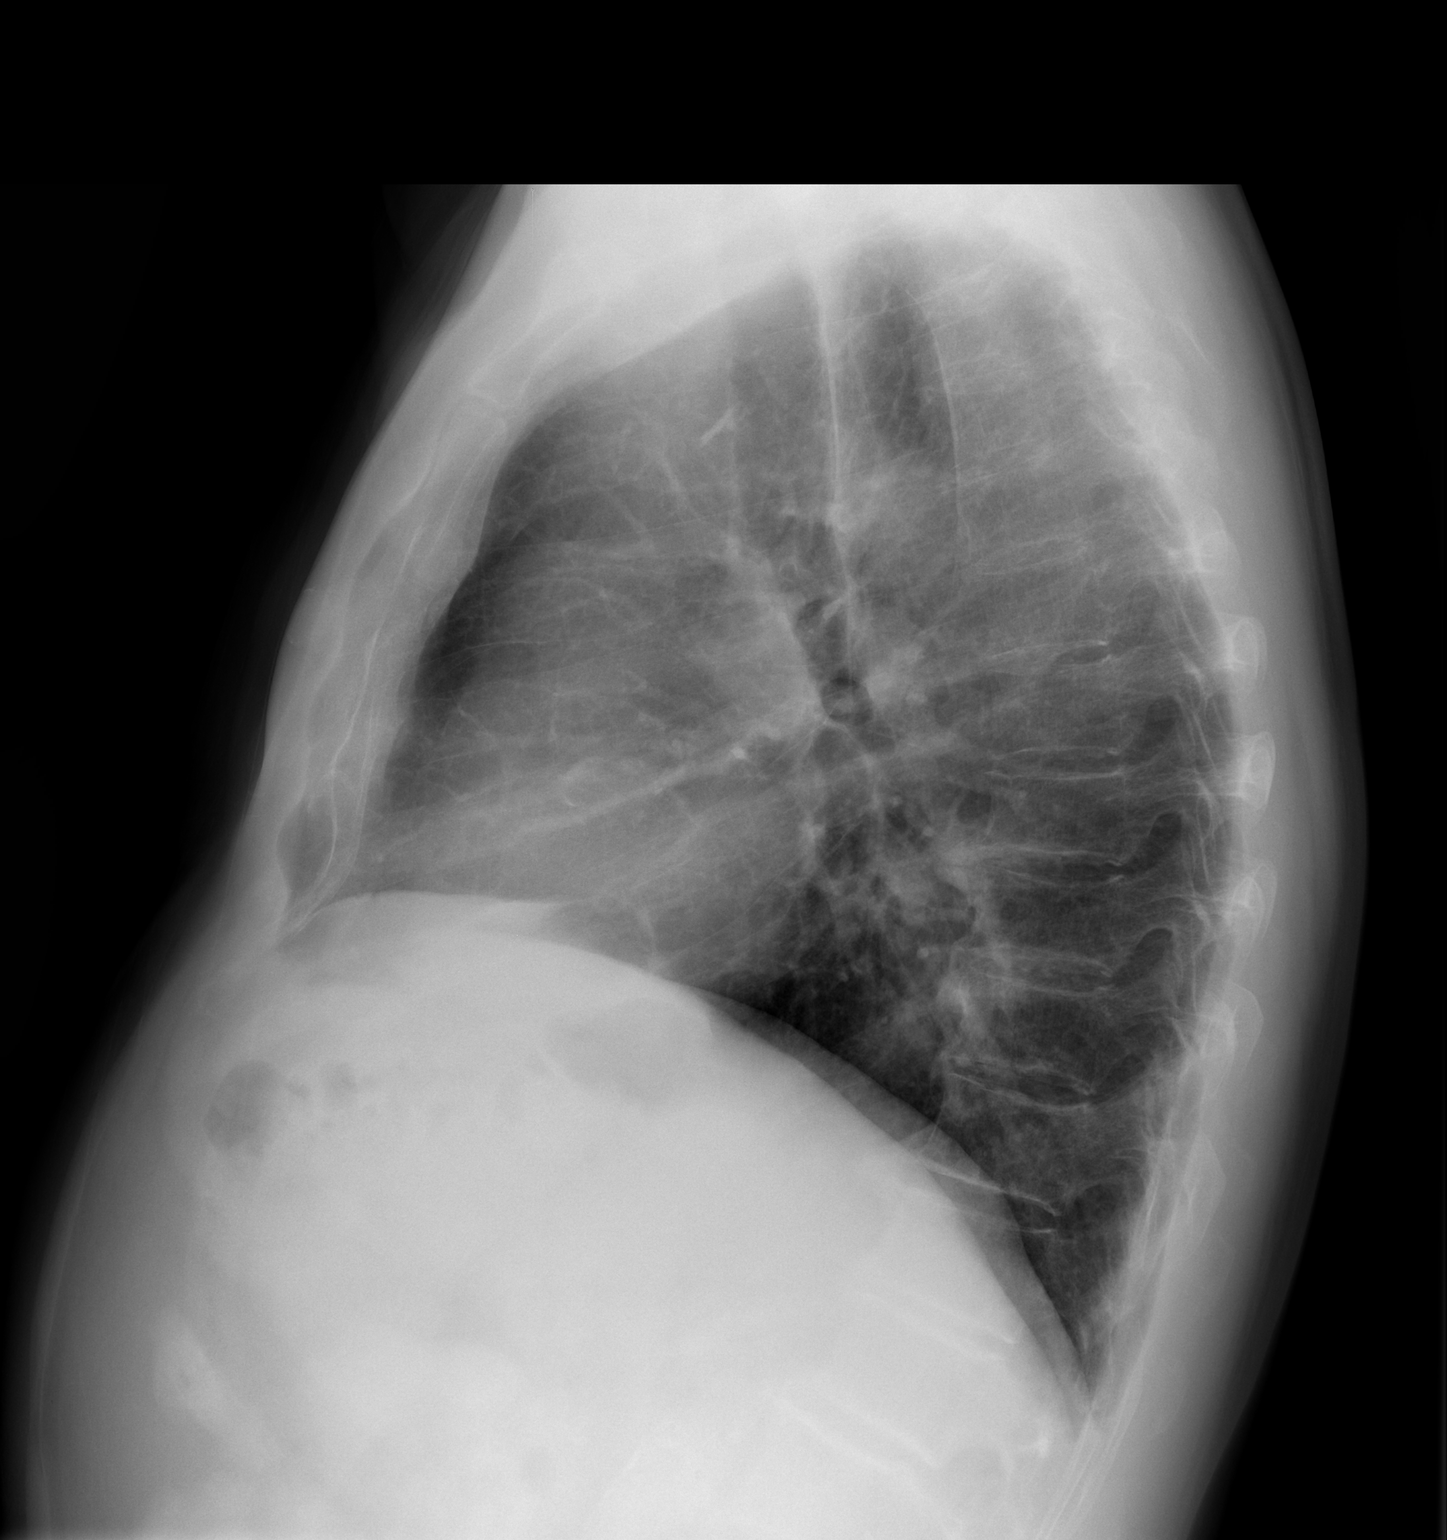

[2 of 2 positions shown; findings below may reference images not displayed]

FINDINGS: Cardiac silhouette is normal in size. Normal mediastinal and hilar
contours. Mild scarring at the lung apices, stable. Lungs otherwise
clear. No pleural effusion or pneumothorax.

Bony thorax is demineralized but intact.
IMPRESSION: No acute cardiopulmonary disease.

## 2014-10-11 ENCOUNTER — Encounter (HOSPITAL_COMMUNITY): Payer: Self-pay | Admitting: General Practice

## 2014-10-11 ENCOUNTER — Encounter (HOSPITAL_COMMUNITY)
Admission: RE | Disposition: A | Discharge status: Home / Self Care | Payer: Self-pay | Source: Ambulatory Visit | Attending: Cardiovascular Disease

## 2014-10-11 ENCOUNTER — Ambulatory Visit (HOSPITAL_COMMUNITY)
Admission: RE | Admit: 2014-10-11 | Discharge: 2014-10-12 | Disposition: A | Discharge status: Home / Self Care | Payer: Medicare Other | Source: Ambulatory Visit | Attending: Cardiovascular Disease | Admitting: Cardiovascular Disease

## 2014-10-11 DIAGNOSIS — I739 Peripheral vascular disease, unspecified: Secondary | ICD-10-CM | POA: Diagnosis present

## 2014-10-11 DIAGNOSIS — I70213 Atherosclerosis of native arteries of extremities with intermittent claudication, bilateral legs: Secondary | ICD-10-CM | POA: Diagnosis not present

## 2014-10-11 DIAGNOSIS — Z95820 Peripheral vascular angioplasty status with implants and grafts: Secondary | ICD-10-CM | POA: Insufficient documentation

## 2014-10-11 DIAGNOSIS — I701 Atherosclerosis of renal artery: Secondary | ICD-10-CM | POA: Diagnosis not present

## 2014-10-11 DIAGNOSIS — Z7982 Long term (current) use of aspirin: Secondary | ICD-10-CM | POA: Diagnosis not present

## 2014-10-11 DIAGNOSIS — I10 Essential (primary) hypertension: Secondary | ICD-10-CM | POA: Diagnosis not present

## 2014-10-11 DIAGNOSIS — E785 Hyperlipidemia, unspecified: Secondary | ICD-10-CM | POA: Insufficient documentation

## 2014-10-11 DIAGNOSIS — Z87891 Personal history of nicotine dependence: Secondary | ICD-10-CM | POA: Insufficient documentation

## 2014-10-11 DIAGNOSIS — I70211 Atherosclerosis of native arteries of extremities with intermittent claudication, right leg: Secondary | ICD-10-CM

## 2014-10-11 DIAGNOSIS — Z7902 Long term (current) use of antithrombotics/antiplatelets: Secondary | ICD-10-CM | POA: Diagnosis not present

## 2014-10-11 HISTORY — PX: PERCUTANEOUS STENT INTERVENTION: SHX5500

## 2014-10-11 HISTORY — PX: LOWER EXTREMITY ANGIOGRAM: SHX5508

## 2014-10-11 LAB — POCT ACTIVATED CLOTTING TIME
ACTIVATED CLOTTING TIME: 197 s
ACTIVATED CLOTTING TIME: 214 s
ACTIVATED CLOTTING TIME: 157 s
Activated Clotting Time: 236 seconds

## 2014-10-11 SURGERY — ANGIOGRAM, LOWER EXTREMITY
Anesthesia: LOCAL

## 2014-10-11 MED ORDER — FENTANYL CITRATE 0.05 MG/ML IJ SOLN
INTRAMUSCULAR | Status: AC
  Filled 2014-10-11: qty 2

## 2014-10-11 MED ORDER — HEPARIN SODIUM (PORCINE) 1000 UNIT/ML IJ SOLN
INTRAMUSCULAR | Status: AC
  Filled 2014-10-11: qty 1

## 2014-10-11 MED ORDER — SODIUM CHLORIDE 0.9 % IJ SOLN: 3.0000 mL | INTRAMUSCULAR | Status: DC | PRN

## 2014-10-11 MED ORDER — PANTOPRAZOLE SODIUM 40 MG PO TBEC
40.0000 mg | DELAYED_RELEASE_TABLET | Freq: Every day | ORAL | Status: DC
  Administered 2014-10-12: 40 mg via ORAL
  Filled 2014-10-11: qty 1

## 2014-10-11 MED ORDER — DOXAZOSIN MESYLATE 2 MG PO TABS
2.0000 mg | ORAL_TABLET | Freq: Every day | ORAL | Status: DC
  Administered 2014-10-11: 21:00:00 2 mg via ORAL
  Filled 2014-10-11 (×4): qty 1

## 2014-10-11 MED ORDER — LOSARTAN POTASSIUM-HCTZ 100-12.5 MG PO TABS: 1.0000 | ORAL_TABLET | Freq: Every day | ORAL | Status: DC

## 2014-10-11 MED ORDER — LOSARTAN POTASSIUM 50 MG PO TABS
100.0000 mg | ORAL_TABLET | Freq: Every day | ORAL | Status: DC
  Administered 2014-10-12: 12:00:00 100 mg via ORAL
  Filled 2014-10-11: qty 2

## 2014-10-11 MED ORDER — ATROPINE SULFATE 0.1 MG/ML IJ SOLN
INTRAMUSCULAR | Status: DC
Start: 2014-10-11 — End: 2014-10-11
  Filled 2014-10-11: qty 10

## 2014-10-11 MED ORDER — CLOPIDOGREL BISULFATE 75 MG PO TABS
75.0000 mg | ORAL_TABLET | Freq: Every day | ORAL | Status: DC
  Administered 2014-10-12: 12:00:00 75 mg via ORAL
  Filled 2014-10-11: qty 1

## 2014-10-11 MED ORDER — MORPHINE SULFATE 2 MG/ML IJ SOLN
2.0000 mg | INTRAMUSCULAR | Status: DC | PRN
  Administered 2014-10-11 – 2014-10-12 (×2): 2 mg via INTRAVENOUS
  Filled 2014-10-11 (×3): qty 1

## 2014-10-11 MED ORDER — SODIUM CHLORIDE 0.9 % IV SOLN
INTRAVENOUS | Status: AC
  Administered 2014-10-11: 10:00:00 via INTRAVENOUS

## 2014-10-11 MED ORDER — HYDROCHLOROTHIAZIDE 12.5 MG PO CAPS
12.5000 mg | ORAL_CAPSULE | Freq: Every day | ORAL | Status: DC
  Administered 2014-10-12: 12.5 mg via ORAL
  Filled 2014-10-11: qty 1

## 2014-10-11 MED ORDER — ASPIRIN EC 81 MG PO TBEC
81.0000 mg | DELAYED_RELEASE_TABLET | Freq: Every day | ORAL | Status: DC
Start: 2014-10-12 — End: 2014-10-12
  Administered 2014-10-12: 81 mg via ORAL
  Filled 2014-10-11: qty 1

## 2014-10-11 MED ORDER — HEPARIN (PORCINE) IN NACL 2-0.9 UNIT/ML-% IJ SOLN
INTRAMUSCULAR | Status: AC
  Filled 2014-10-11: qty 1000

## 2014-10-11 MED ORDER — ASPIRIN 81 MG PO CHEW: 81.0000 mg | CHEWABLE_TABLET | ORAL | Status: DC

## 2014-10-11 MED ORDER — SODIUM CHLORIDE 0.9 % IV SOLN
INTRAVENOUS | Status: DC
  Administered 2014-10-11: 07:00:00 via INTRAVENOUS

## 2014-10-11 MED ORDER — ACETAMINOPHEN 325 MG PO TABS
650.0000 mg | ORAL_TABLET | ORAL | Status: DC | PRN
  Administered 2014-10-11 – 2014-10-12 (×2): 650 mg via ORAL
  Filled 2014-10-11 (×2): qty 2

## 2014-10-11 MED ORDER — ONDANSETRON HCL 4 MG/2ML IJ SOLN
4.0000 mg | Freq: Four times a day (QID) | INTRAMUSCULAR | Status: DC | PRN
  Administered 2014-10-11 – 2014-10-12 (×3): 4 mg via INTRAVENOUS
  Filled 2014-10-11 (×3): qty 2

## 2014-10-11 MED ORDER — LIDOCAINE HCL (PF) 1 % IJ SOLN
INTRAMUSCULAR | Status: AC
  Filled 2014-10-11: qty 30

## 2014-10-11 MED ORDER — MIDAZOLAM HCL 2 MG/2ML IJ SOLN
INTRAMUSCULAR | Status: AC
Start: 2014-10-11 — End: 2014-10-11
  Filled 2014-10-11: qty 2

## 2014-10-11 MED ORDER — DILTIAZEM HCL ER 240 MG PO CP24
240.0000 mg | ORAL_CAPSULE | Freq: Every day | ORAL | Status: DC
  Administered 2014-10-12: 240 mg via ORAL
  Filled 2014-10-11: qty 1

## 2014-10-11 NOTE — Interval H&P Note (Signed)
History and Physical Interval Note:  10/11/2014 7:46 AM  Joshua Wyatt  has presented today for surgery, with the diagnosis of claudication  The various methods of treatment have been discussed with the patient and family. After consideration of risks, benefits and other options for treatment, the patient has consented to  Procedure(s): LOWER EXTREMITY ANGIOGRAM (N/A) as a surgical intervention .  The patient's history has been reviewed, patient examined, no change in status, stable for surgery.  I have reviewed the patient's chart and labs.  Questions were answered to the patient's satisfaction.     Lorretta Harp

## 2014-10-11 NOTE — Progress Notes (Signed)
Site area: left groin  Site Prior to Removal:  Level 0  Pressure Applied For 20  MINUTES    Minutes Beginning at 1200  Manual:   Yes.    Patient Status During Pull:  stable  Post Pull Groin Site:  Level 1  Post Pull Instructions Given:  Yes.    Post Pull Pulses Present:  Yes.    Dressing Applied:  Yes.    Comments:

## 2014-10-11 NOTE — Plan of Care (Signed)
Problem: Consults Goal: Vascular Cath Int Patient Education (See Patient Education module for education specifics.) Outcome: Completed/Met Date Met:  10/11/14 Goal: Skin Care Protocol Initiated - if Braden Score 18 or less If consults are not indicated, leave blank or document N/A Outcome: Completed/Met Date Met:  10/11/14  Problem: Phase I Progression Outcomes Goal: Distal pulses equal to baseline Outcome: Completed/Met Date Met:  10/11/14 Goal: Vascular site scale level 0 - I Vascular Site Scale Level 0: No bruising/bleeding/hematoma Level I (Mild): Bruising/Ecchymosis, minimal bleeding/ooozing, palpable hematoma < 3 cm Level II (Moderate): Bleeding not affecting hemodynamic parameters, pseudoaneurysm, palpable hematoma > 3 cm Level III (Severe) Bleeding which affects hemodynamic parameters or retroperitoneal hemorrhage  Outcome: Completed/Met Date Met:  10/11/14 Goal: Pain controlled with appropriate interventions Outcome: Completed/Met Date Met:  10/11/14 Goal: Voiding-avoid urinary catheter unless indicated Outcome: Completed/Met Date Met:  10/11/14 Goal: Hemodynamically stable Outcome: Completed/Met Date Met:  10/11/14     

## 2014-10-11 NOTE — CV Procedure (Signed)
Joshua Wyatt is a 68 y.o. male    [id number] LOCATION:  FACILITY: [facility]  PHYSICIAN: [name], M.D. Mar 09, 1946   DATE OF PROCEDURE:  10/11/2014  DATE OF DISCHARGE:     PV Angiogram/Intervention    History obtained from chart review.68 year old married Caucasian male with no children who is followed by Dr. Gwenlyn Found. He has PVD, no history of CAD. He had a negative Myoview April 2014. He has a history of PVOD and is status post bilateral carotid endarterectomies performed by Dr. Drucie Opitz in 2007. We have been following him by duplex ultrasound which have revealed this to be widely patent. He has also had bilateral SFA intervention as well as right renal artery stenting. Renal duplex in March 2014 revealed a patent Rt RA stent and suggested some Lt RA stenosis, thought the ratio was less than 3. This was unchanged from the previous study.  He recently developed Rt > Lt LE claudication. Dopplers done 08/10/14 reveled progression of LE disease. His ABI was .23 on Rt and .65 on the Lt. He saw Dr Gwenlyn Found 08/18/14 and is scheduled for LEA Nov 2nd 2015. Since he saw Dr Gwenlyn Found he has done well. Hew denies chest pain or dyspnea. He continues to have Rt > Lt claudication. He reports his B/P is "116" at home but to day it was > 637 systolic when he was checked by the RN and 162/90 when I re checked it.   PROCEDURE DESCRIPTION:   The patient was brought to the second floor [facility] cath lab in the postabsorptive state. He was premedicated with Valium 5 mg by mouth, IV Versed and fentanyl. His left groinwas prepped and shaved in usual sterile fashion. Xylocaine 1% was used for local anesthesia. A 5 French sheath was inserted into the left common femoral artery using standard Seldinger technique.a 5 French pigtail catheter was placed at the level of the renal arteries. Midstream abdominal aortography, bilateral iliac angiography with bifemoral runoff was performed using bolus chase digital  subtraction step table technique. Selective left renal angiography was performed with a short IMA catheter. Omnipaque dye was used for the entirety of the case. Retrograde aortic pressure was monitored during the case.  HEMODYNAMICS:    AO SYSTOLIC/AO DIASTOLIC: 858/85   Angiographic Data:   1: Abdominal aortogram-patent right renal artery stent, 70-80% proximal left renal artery stenosis. The infrarenal abdominal aorta was moderately atherosclerotic.  2: Left lower extremity-diffuse 60% hypodense distal left common iliac artery stenosis. There were tandem 50/90 and 80% stenoses in the proximal and mid left SFA with a patent previously placed stent. There was a 90% fairly focal popliteal lesion in the P1 segment with an occluded anterior tibial artery.  3: Right lower extremity-there was a 50-60% concentric proximal right common iliac artery stenosis. The entire proximal and mid right SFA was subtotally occluded down to the previously placed stent which was patent. There was an 80% P3 segment into the tibioperoneal trunk with occluded anterior tibial.  IMPRESSION:Joshua Wyatt has high-grade left renal artery stenosis, high-grade proximal and mid segmental right SFA disease as well as popliteal disease. He is symptomatic on his right greater than left. We will proceed with percutaneous intervention using chocolate balloon angioplasty of his P3 segment of his popliteal artery followed by drug-eluting balloon angioplasty and PTA and stenting of his proximal and mid right SFA usingViabahn  covered stents.  Procedure Description:the patient received 10,000 units of heparin intravenously with an ending ACT of 214. A total  of 300 mL of contrast was administered to the patient. Contralateral access was obtained with a crossover catheter, 0.35 cm Versicore wire and a 7 [place] multipurpose destination sheath. I was able to cross the long segmentally diseased SFA with an 01/300 cm long [place] core wire within a  quick cross end hole catheter. I placed this in the peroneal artery. I then performed PTCA with a 3.5 mm x 4 cm chocolate problem followed by angioplasty with a 4 mm x 4 cm Lutonix drug-eluting balloon. This provided excellent into graphic result with reduction of an 80-90% right distal popliteal stenosis to less than 10% without dissection. Following this that we performed balloon angioplasty using a 6 mm x 250 mm long balloon in the proximal and mid right SFA followed by stenting using a 6 mm x 250 mm long Viabahn covered stent overlapped with a 6 mm x 50 mmViabahn stent up to the ostium of the SFA. I postdilated the entire segment with a 6 mm x 250 mm balloon. There was a small hypodense filling defect in the distal aspect of the previously placed Nitrol mid to distal right SFA stent was stented with a 6 mm x 40 mm Abbott Nitrol absolute Pro self-expanding stent. The final angiographic result was reduction of a subtotally occluded proximal and mid right SFA to 0% residual. Completion angiography was performed revealing brisk flow down the posterior tibial and peroneal arteries. The sheath was withdrawn across the bifurcation and exchanged over an 35 wire for a short 7 [place] sheath.  Final Impression: successful chocolate balloon angioplasty, PTCA using Lutonix DEB of the P3 segment of the right popliteal artery followed by PTA and stenting of the proximal and mid right SFA using Viabahn covered stents.the patient tolerated the procedure well. He is already on dual antibiotic therapy. The sheath will be removed and pressure will be held on the groin to achieve hemostasis once the ACT falls below 170. He will be hydrated overnight, discharged home in the morning. He will get follow-up lower extremity arterial Doppler studies and on with light office in one week and will follow-up with in mid level provider in 2-3 weeks on a daily that time in the office. He left the lab in stable condition.    Lorretta Harp MD, [facility] 10/11/2014 10:11 AM

## 2014-10-11 NOTE — H&P (View-Only) (Signed)
09/29/2014 Santina Evans   1946-02-18  [id number]  Primary Physicia Thressa Sheller, MD Primary Cardiologist: Dr Gwenlyn Found  HPI:  68 year old  married Caucasian male with no children who is followed by Dr. Gwenlyn Found. He has PVD, no history of CAD. He had a negative Myoview April 2014. He has a history of PVOD and is  status post bilateral carotid endarterectomies performed by Dr. Drucie Opitz in 2007. We have been following him by duplex ultrasound which have revealed this to be widely patent. He has also had bilateral SFA intervention as well as right renal artery stenting. Renal duplex in March 2014 revealed a patent Rt RA stent and suggested some Lt RA stenosis, thought the ratio was less than 3. This was unchanged from the previous study.          He recently developed Rt > Lt LE claudication. Dopplers done 08/10/14 reveled progression of LE disease. His ABI was .23 on Rt and .65 on the Lt. He saw Dr Gwenlyn Found 08/18/14 and is scheduled for LEA Nov 2nd 2015. Since he saw Dr Gwenlyn Found he has done well. Hew denies chest pain or dyspnea. He continues to have Rt > Lt claudication. He reports his B/P is "116" at home but to day it was > 154 systolic when he was checked by the RN and 162/90 when I re checked it.     Current Outpatient Prescriptions  Medication Sig Dispense Refill  . aspirin EC 81 MG tablet Take 81 mg by mouth daily.      . Biotin 1000 MCG tablet Take 1,000 mcg by mouth daily.      . clopidogrel (PLAVIX) 75 MG tablet Take 75 mg by mouth daily.      [name] diltiazem (DILACOR XR) 240 MG 24 hr capsule Take 240 mg by mouth daily.      [name] losartan-hydrochlorothiazide (HYZAAR) 100-12.5 MG per tablet Take 1 tablet by mouth daily.      [name] omeprazole (PRILOSEC OTC) 20 MG tablet Take 20 mg by mouth daily.      [name] doxazosin (CARDURA) 2 MG tablet Take 1 tablet (2 mg total) by mouth at bedtime.  30 tablet  11   No current facility-administered medications for this visit.    Allergies  Allergen Reactions  .  Statins Other (See Comments)    unspecified    History   Social History  . Marital Status: Married    Spouse Name: N/A    Number of Children: N/A  . Years of Education: N/A   Occupational History  . Not on file.   Social History Main Topics  . Smoking status: Former Smoker    Types: Cigars    Quit date: 10/18/2013  . Smokeless tobacco: Never Used  . Alcohol Use: No  . Drug Use: No  . Sexual Activity: Not on file     Comment: RARE  CIG[employer]   Other Topics Concern  . Not on file   Social History Narrative  . No narrative on file     Review of Systems: General: negative for chills, fever, night sweats or weight changes.  Cardiovascular: negative for chest pain, dyspnea on exertion, edema, orthopnea, palpitations, paroxysmal nocturnal dyspnea or shortness of breath Dermatological: negative for rash Respiratory: negative for cough or wheezing Urologic: negative for hematuria Abdominal: negative for nausea, vomiting, diarrhea, bright red blood per rectum, melena, or hematemesis Neurologic: negative for visual changes, syncope, or dizziness All other systems reviewed and are otherwise negative except as  noted above.    Blood pressure 184/92, pulse 59, height $RemoveBe'5\' 7"'nhmhWtADT$  (1.702 m), weight 165 lb 11.2 oz (75.161 kg).  General appearance: alert, cooperative and no distress Neck: no JVD and Rt CA bruit. Recent mole removed Rt neck Lungs: clear to auscultation bilaterally Heart: regular rate and rhythm Abdomen: soft, non-tender; bowel sounds normal; no masses,  no organomegaly Extremities: extremities normal, atraumatic, no cyanosis or edema Pulses: diminnished LE pulses Skin: Skin color, texture, turgor normal. No rashes or lesions Neurologic: Grossly normal  EKG NSR  ASSESSMENT AND PLAN:   Claudication of left lower extremity R.L claudication  PVD (peripheral vascular disease) Abnormal LEA dopplers 08/10/14. Pt scheduled for LEA  10/11/14.  Hypertension Uncontrolled  Hyperlipidemia Statin intolerance   PLAN  I added Cardura 2 mg QHS. I reviewed his renal dopplers with Dr Gwenlyn Found. With his recent B/P elevation he will take a look at his renal arteries at the time of his PVA.   Chakara Bognar KPA-C 09/29/2014 9:25 AM

## 2014-10-12 ENCOUNTER — Encounter: Payer: Self-pay | Admitting: *Deleted

## 2014-10-12 ENCOUNTER — Other Ambulatory Visit: Payer: Self-pay | Admitting: Physician Assistant

## 2014-10-12 DIAGNOSIS — I739 Peripheral vascular disease, unspecified: Secondary | ICD-10-CM

## 2014-10-12 DIAGNOSIS — I70213 Atherosclerosis of native arteries of extremities with intermittent claudication, bilateral legs: Secondary | ICD-10-CM | POA: Diagnosis not present

## 2014-10-12 LAB — BASIC METABOLIC PANEL
ANION GAP: 13 (ref 5–15)
BUN: 12 mg/dL (ref 6–23)
CALCIUM: 10.3 mg/dL (ref 8.4–10.5)
CO2: 25 mEq/L (ref 19–32)
Chloride: 95 mEq/L — ABNORMAL LOW (ref 96–112)
Creatinine, Ser: 1.04 mg/dL (ref 0.50–1.35)
GFR calc Af Amer: 83 mL/min — ABNORMAL LOW (ref >90)
GFR, EST NON AFRICAN AMERICAN: 71 mL/min — AB (ref >90)
Glucose, Bld: 110 mg/dL — ABNORMAL HIGH (ref 70–99)
Potassium: 5.3 mEq/L (ref 3.7–5.3)
Sodium: 133 mEq/L — ABNORMAL LOW (ref 137–147)

## 2014-10-12 LAB — CBC
HCT: 36.5 % — ABNORMAL LOW (ref 39.0–52.0)
Hemoglobin: 12.4 g/dL — ABNORMAL LOW (ref 13.0–17.0)
MCH: 29.3 pg (ref 26.0–34.0)
MCHC: 34 g/dL (ref 30.0–36.0)
MCV: 86.3 fL (ref 78.0–100.0)
PLATELETS: 224 10*3/uL (ref 150–400)
RBC: 4.23 MIL/uL (ref 4.22–5.81)
RDW: 12.9 % (ref 11.5–15.5)
WBC: 10.5 10*3/uL (ref 4.0–10.5)

## 2014-10-12 MED ORDER — OXYCODONE-ACETAMINOPHEN 5-325 MG PO TABS
1.0000 | ORAL_TABLET | Freq: Once | ORAL | Status: AC
Start: 2014-10-12 — End: 2014-10-12
  Administered 2014-10-12: 1 via ORAL
  Filled 2014-10-12: qty 1

## 2014-10-12 MED ORDER — ONDANSETRON HCL 8 MG PO TABS
8.0000 mg | ORAL_TABLET | Freq: Once | ORAL | Status: AC
  Administered 2014-10-12: 10:00:00 8 mg via ORAL
  Filled 2014-10-12: qty 1

## 2014-10-12 NOTE — Discharge Summary (Signed)
Physician Discharge Summary    Cardiologist:  Gwenlyn Found  Patient ID: Joshua Wyatt MRN: [id number] DOB/AGE: Apr 11, 1946 68 y.o.  Admit date: 10/11/2014 Discharge date: 10/12/2014  Admission Diagnoses:  Claudication, PAD  Discharge Diagnoses:  Active Problems:   PVD (peripheral vascular disease)   Claudication   Discharged Condition: stable  Hospital Course:  68 year old married Caucasian male with no children who is followed by Dr. Gwenlyn Found. He has PVD, no history of CAD. He had a negative Myoview April 2014. He has a history of PVOD and is status post bilateral carotid endarterectomies performed by Dr. Drucie Opitz in 2007. We have been following him by duplex ultrasound which have revealed this to be widely patent. He has also had bilateral SFA intervention as well as right renal artery stenting. Renal duplex in March 2014 revealed a patent Rt RA stent and suggested some Lt RA stenosis, thought the ratio was less than 3. This was unchanged from the previous study.  He recently developed Rt > Lt LE claudication. Dopplers done 08/10/14 reveled progression of LE disease. His ABI was .23 on Rt and .65 on the Lt. He saw Dr Gwenlyn Found 08/18/14 and is scheduled for LEA Nov 2nd 2015. Since he saw Dr Gwenlyn Found he has done well. Hew denies chest pain or dyspnea. He continues to have Rt > Lt claudication. He reports his B/P is "116" at home but to day it was > 102 systolic when he was checked by the RN.  He was scheduled for and underwent lower extremity PV angiogram with successful chocolate balloon angioplasty, PTCA using Lutonix DEB of the P3 segment of the right popliteal artery followed by PTA and stenting of the proximal and mid right SFA using Viabahn covered stents. FU dopplers in 2-3 weeks. ASA and plavix. SCr stable.  During exam post cath he noted severe pain/soreness in the medial right thigh and a bruit(probably not new).  Percocet was given and zofran.  LEA doppler revealed patent common femoral,  superficial femoral and profunda arteries.  Distal pulses good.  The patient was seen by Dr. Tamala Julian who felt he was stable for DC home.    Consults: None  Significant Diagnostic Studies:   PV Angiogram/Intervention  PROCEDURE DESCRIPTION:   The patient was brought to the second floor [facility] lab in the postabsorptive state. He was premedicated with Valium 5 mg by mouth, IV Versed and fentanyl. His left groinwas prepped and shaved in usual sterile fashion. Xylocaine 1% was used for local anesthesia. A 5 French sheath was inserted into the left common femoral artery using standard Seldinger technique.a 5 French pigtail catheter was placed at the level of the renal arteries. Midstream abdominal aortography, bilateral iliac angiography with bifemoral runoff was performed using bolus chase digital subtraction step table technique. Selective left renal angiography was performed with a short IMA catheter. Omnipaque dye was used for the entirety of the case. Retrograde aortic pressure was monitored during the case.  HEMODYNAMICS:   AO SYSTOLIC/AO DIASTOLIC: 725/36  Angiographic Data:   1: Abdominal aortogram-patent right renal artery stent, 70-80% proximal left renal artery stenosis. The infrarenal abdominal aorta was moderately atherosclerotic.  2: Left lower extremity-diffuse 60% hypodense distal left common iliac artery stenosis. There were tandem 50/90 and 80% stenoses in the proximal and mid left SFA with a patent previously placed stent. There was a 90% fairly focal popliteal lesion in the P1 segment with an occluded anterior tibial artery.  3: Right lower extremity-there was a 50-60%  concentric proximal right common iliac artery stenosis. The entire proximal and mid right SFA was subtotally occluded down to the previously placed stent which was patent. There was an 80% P3 segment into the tibioperoneal trunk with occluded anterior tibial.  IMPRESSION:Mr. Noffke has  high-grade left renal artery stenosis, high-grade proximal and mid segmental right SFA disease as well as popliteal disease. He is symptomatic on his right greater than left. We will proceed with percutaneous intervention using chocolate balloon angioplasty of his P3 segment of his popliteal artery followed by drug-eluting balloon angioplasty and PTA and stenting of his proximal and mid right SFA usingViabahn covered stents.  Procedure Description:the patient received 10,000 units of heparin intravenously with an ending ACT of 214. A total of 300 mL of contrast was administered to the patient. Contralateral access was obtained with a crossover catheter, 0.35 cm Versicore wire and a 7 [place] multipurpose destination sheath. I was able to cross the long segmentally diseased SFA with an 01/300 cm long [place] core wire within a quick cross end hole catheter. I placed this in the peroneal artery. I then performed PTCA with a 3.5 mm x 4 cm chocolate problem followed by angioplasty with a 4 mm x 4 cm Lutonix drug-eluting balloon. This provided excellent into graphic result with reduction of an 80-90% right distal popliteal stenosis to less than 10% without dissection. Following this that we performed balloon angioplasty using a 6 mm x 250 mm long balloon in the proximal and mid right SFA followed by stenting using a 6 mm x 250 mm long Viabahn covered stent overlapped with a 6 mm x 50 mmViabahn stent up to the ostium of the SFA. I postdilated the entire segment with a 6 mm x 250 mm balloon. There was a small hypodense filling defect in the distal aspect of the previously placed Nitrol mid to distal right SFA stent was stented with a 6 mm x 40 mm Abbott Nitrol absolute Pro self-expanding stent. The final angiographic result was reduction of a subtotally occluded proximal and mid right SFA to 0% residual. Completion angiography was performed revealing brisk flow down the posterior tibial and peroneal arteries. The sheath  was withdrawn across the bifurcation and exchanged over an 35 wire for a short 7 [place] sheath.  Final Impression: successful chocolate balloon angioplasty, PTCA using Lutonix DEB of the P3 segment of the right popliteal artery followed by PTA and stenting of the proximal and mid right SFA using Viabahn covered stents.the patient tolerated the procedure well. He is already on dual antibiotic therapy. The sheath will be removed and pressure will be held on the groin to achieve hemostasis once the ACT falls below 170. He will be hydrated overnight, discharged home in the morning. He will get follow-up lower extremity arterial Doppler studies and on with light office in one week and will follow-up with in mid level provider in 2-3 weeks on a daily that time in the office. He left the lab in stable condition.    Lorretta Harp MD, [facility] 10/11/2014  Treatments: See Above  Discharge Exam: Blood pressure 154/74, pulse 89, temperature 98.4 F (36.9 C), temperature source Oral, resp. rate 18, height 5' 7.5" (1.715 m), weight 166 lb 0.1 oz (75.3 kg), SpO2 100 %.   Disposition: 01-Home or Self Care      Discharge Instructions    Diet - low sodium heart healthy    Complete by:  As directed      Discharge instructions  Complete by:  As directed   No lifting more than a half gallon of milk or driving for three days.     Increase activity slowly    Complete by:  As directed             Medication List    TAKE these medications        aspirin EC 81 MG tablet  Take 81 mg by mouth daily.     Biotin 1000 MCG tablet  Take 1,000 mcg by mouth daily.     clopidogrel 75 MG tablet  Commonly known as:  PLAVIX  Take 75 mg by mouth daily.     diltiazem 240 MG 24 hr capsule  Commonly known as:  DILACOR XR  Take 240 mg by mouth daily.     doxazosin 2 MG tablet  Commonly known as:  CARDURA  Take 1 tablet (2 mg total) by mouth at bedtime.     losartan-hydrochlorothiazide 100-12.5 MG per  tablet  Commonly known as:  HYZAAR  Take 1 tablet by mouth daily.     omeprazole 20 MG tablet  Commonly known as:  PRILOSEC OTC  Take 20 mg by mouth daily.       Follow-up Information    Follow up with Lorretta Harp, MD.   Specialty:  Cardiology   Why:  The office will call you with the appts dates and times.    Contact information:   [address] [place] [address] [phone]      Greater than 30 minutes was spent completing the patient's discharge.    SignedTarri Fuller, [place] 10/12/2014, 12:37 PM

## 2014-10-12 NOTE — Progress Notes (Signed)
    Subjective: Right thigh feels sore.   Objective: Vital signs in last 24 hours: Temp:  [97.7 F (36.5 C)-98.7 F (37.1 C)] 97.9 F (36.6 C) (11/03 0322) Pulse Rate:  [50-84] 72 (11/03 0322) Resp:  [8-18] 15 (11/03 0322) BP: (105-151)/(47-109) 133/74 mmHg (11/03 0322) SpO2:  [95 %-100 %] 98 % (11/03 0322) Weight:  [166 lb 0.1 oz (75.3 kg)] 166 lb 0.1 oz (75.3 kg) (11/03 0230) Last BM Date: 10/11/14  Intake/Output from previous day: 11/02 0701 - 11/03 0700 In: 3825 [P.O.:720; I.V.:675] Out: 1225 [Urine:1225] Intake/Output this shift:    Medications Current Facility-Administered Medications  Medication Dose Route Frequency Provider Last Rate Last Dose  . acetaminophen (TYLENOL) tablet 650 mg  650 mg Oral Q4H PRN Lorretta Harp, MD   650 mg at 10/11/14 2102  . aspirin EC tablet 81 mg  81 mg Oral Daily Lorretta Harp, MD      . clopidogrel (PLAVIX) tablet 75 mg  75 mg Oral Daily Lorretta Harp, MD      . diltiazem (DILACOR XR) 24 hr capsule 240 mg  240 mg Oral Daily Lorretta Harp, MD      . doxazosin (CARDURA) tablet 2 mg  2 mg Oral QHS Lorretta Harp, MD   2 mg at 10/11/14 2102  . losartan (COZAAR) tablet 100 mg  100 mg Oral Daily Lorretta Harp, MD       And  . hydrochlorothiazide (MICROZIDE) capsule 12.5 mg  12.5 mg Oral Daily Lorretta Harp, MD      . morphine 2 MG/ML injection 2 mg  2 mg Intravenous Q1H PRN Lorretta Harp, MD   2 mg at 10/12/14 0327  . ondansetron (ZOFRAN) injection 4 mg  4 mg Intravenous Q6H PRN Lorretta Harp, MD   4 mg at 10/12/14 0327  . pantoprazole (PROTONIX) EC tablet 40 mg  40 mg Oral Daily Lorretta Harp, MD        PE: General appearance: alert, cooperative and no distress Lungs: clear to auscultation bilaterally Heart: regular rate and rhythm and 1/6 sys MM Extremities: No LEE Pulses: 2+ radials, 2+ right popliteal. 0 DP, 1+ right PT.  Right foot warm.  Right femoral bruit. Skin: Warm and dry.  Left groin: small  hematoma and mildly tender.  No ecchymosis Neurologic: Grossly normal  Lab Results:   Recent Labs  10/12/14 0446  WBC 10.5  HGB 12.4*  HCT 36.5*  PLT 224   BMET  Recent Labs  10/12/14 0446  NA 133*  K 5.3  CL 95*  CO2 25  GLUCOSE 110*  BUN 12  CREATININE 1.04  CALCIUM 10.3     Assessment/Plan  Active Problems:   PVD (peripheral vascular disease)   Claudication  Plan:  SP successful chocolate balloon angioplasty, PTCA using Lutonix DEB of the P3 segment of the right popliteal artery followed by PTA and stenting of the proximal and mid right SFA using Viabahn covered stents.  FU dopplers in 2-3 weeks.  ASA and plavix.  SCr stable.   He is complaining of soreness in the right medial thigh.  It hurst bad enough that he requested pani meds.  There is a femoral bruit.  Right foot is warm.  Checking a arterial doppler before DC.    BP and HR stable.    LOS: 1 day    Jaqwon Manfred PA-C 10/12/2014 7:21 AM

## 2014-10-12 NOTE — Progress Notes (Signed)
VASCULAR LAB PRELIMINARY  PRELIMINARY  PRELIMINARY  PRELIMINARY Right lower extremity arterial duplex completed.    Preliminary report:  The common femoral, superficial femoral and profunda arteries are all patent. The popliteal vein is also patent.  The SFA is extremely small throughout the thigh, but appears to maintain patency throughout.  No evidence of pseudoaneurysm or A/V fistula.  Bralyn Folkert, RVT 10/12/2014, 11:17 AM

## 2014-10-13 ENCOUNTER — Telehealth: Payer: Self-pay | Admitting: Cardiovascular Disease

## 2014-10-18 ENCOUNTER — Telehealth (HOSPITAL_COMMUNITY): Payer: Self-pay | Admitting: *Deleted

## 2014-10-18 NOTE — Telephone Encounter (Signed)
Closed encounter °

## 2014-10-28 ENCOUNTER — Ambulatory Visit (HOSPITAL_COMMUNITY)
Admission: RE | Admit: 2014-10-28 | Discharge: 2014-10-28 | Disposition: A | Discharge status: Home / Self Care | Payer: Medicare Other | Source: Ambulatory Visit | Attending: Cardiology | Admitting: Cardiology

## 2014-10-28 DIAGNOSIS — I739 Peripheral vascular disease, unspecified: Secondary | ICD-10-CM | POA: Insufficient documentation

## 2014-10-28 NOTE — Progress Notes (Signed)
Right Lower Extremity Arterial Duplex Completed. Joshua Wyatt

## 2014-11-02 ENCOUNTER — Ambulatory Visit (INDEPENDENT_AMBULATORY_CARE_PROVIDER_SITE_OTHER): Payer: Medicare Other | Admitting: Physician Assistant

## 2014-11-02 ENCOUNTER — Encounter: Payer: Self-pay | Admitting: Physician Assistant

## 2014-11-02 VITALS — BP 130/70 | HR 76 | Ht 67.5 in | Wt 168.0 lb

## 2014-11-02 DIAGNOSIS — I10 Essential (primary) hypertension: Secondary | ICD-10-CM

## 2014-11-02 DIAGNOSIS — I739 Peripheral vascular disease, unspecified: Secondary | ICD-10-CM

## 2014-11-02 NOTE — Assessment & Plan Note (Addendum)
Follow-up Dopplers show improvement in right ABI 0.98. Patient's claudication has resolved.  He is on aspirin Plavix

## 2014-11-02 NOTE — Progress Notes (Signed)
Patient ID: Joshua Wyatt, male   DOB: 1946-09-25, 68 y.o.   MRN: [id number]    Date:  11/02/2014   ID:  Joshua Wyatt, DOB August 04, 1946, MRN [id number]  PCP:  Thayer Headings, MD  Primary Cardiologist:  Allyson Sabal    History of Present Illness: Joshua Wyatt is a 68 y.o. married Caucasian male with no children who is followed by Dr. Allyson Sabal. He has PVD, no history of CAD. He had a negative Myoview April 2014. He has a history of PVOD and is status post bilateral carotid endarterectomies performed by Dr. Liliane Bade in 2007. We have been following him by duplex ultrasound which have revealed this to be widely patent. He has also had bilateral SFA intervention as well as right renal artery stenting. Renal duplex in March 2014 revealed a patent Rt RA stent and suggested some Lt RA stenosis, thought the ratio was less than 3. This was unchanged from the previous study.  He recently developed Rt > Lt LE claudication. Dopplers done 08/10/14 revealed progression of LE disease. His ABI was .23 on Rt and .65 on the Lt. He saw Dr Allyson Sabal 08/18/14 and is scheduled for LEA Nov 2nd 2015.  He was scheduled for and underwent lower extremity PV angiogram with successful chocolate balloon angioplasty, PTCA using Lutonix DEB of the P3 segment of the right popliteal artery followed by PTA and stenting of the proximal and mid right SFA using Viabahn covered stents.  ASA and plavix. SCr stable. During exam post cath he noted severe pain/soreness in the medial right thigh and a bruit(probably not new). Percocet was given and zofran. LEA doppler revealed patent common femoral, superficial femoral and profunda arteries. Distal pulses good.   Patient presents today for posthospital evaluation. New lower extremity arterial Dopplers show improvement on the right from 0.23 to 0.98.  Patient reports resolution in claudication in the right leg. Seizure to get back to playing golf. He also reports she's probably going to  [place] for about 3 months this winter.  His left groin still is a little tender but has improved.  He does get some edema in his right foot which resolves by morning.  The patient currently denies nausea, vomiting, fever, chest pain, shortness of breath, orthopnea, dizziness, PND, cough, congestion, abdominal pain, hematochezia, melena, lower extremity edema, claudication.  Wt Readings from Last 3 Encounters:  11/02/14 168 lb (76.204 kg)  10/12/14 166 lb 0.1 oz (75.3 kg)  09/29/14 165 lb 11.2 oz (75.161 kg)     Past Medical History  Diagnosis Date  . Peripheral vascular disease   . Hypertension 03-30-2013    PV Angiogram- rt. renal artery stent was widely open,50-60% lt. renal artery stenosis,lt. SFA stents patent with high grade above the knee  poplital stenosis  . GERD (gastroesophageal reflux disease)   . Kidney stones   . Hyperlipemia   . History of echocardiogram 08-02-2005    normal study   . Normal cardiac stress test 03-25-2013    EF 60%, normal stress test ,this is a presurgical  test  . Renal artery stenosis 03-02-2013    renal artery doppler-this was an abnormal doppler  . Cerebral atherosclerosis 03-02-2013    carotid duplex- normal study  . Bilateral calf pain 03-02-2013    lower ext.doppler-mild arterial insufficiency to lower ext. this is a disease in val  . PVD (peripheral vascular disease) 10/25/08    5 french tennis racket catheter was used for midstream and distal abd. aortography with bifemeral  runoff using digital subtraction bolus chase step-table technique. PTA was performed with 4x4 cordis powerflex,stenting with a 6x6 bard nitinol self expanding stent.reduction of distal SFA stenosis from 80 to 0 % with a 5x4 bard rival balloon     Current Outpatient Prescriptions  Medication Sig Dispense Refill  . aspirin EC 81 MG tablet Take 81 mg by mouth daily.    . Biotin 1000 MCG tablet Take 1,000 mcg by mouth daily.    . clopidogrel (PLAVIX) 75 MG tablet Take 75 mg by  mouth daily.    [name] diltiazem (DILACOR XR) 240 MG 24 hr capsule Take 240 mg by mouth daily.    [name] doxazosin (CARDURA) 2 MG tablet Take 1 tablet (2 mg total) by mouth at bedtime. 30 tablet 11  . losartan-hydrochlorothiazide (HYZAAR) 100-12.5 MG per tablet Take 1 tablet by mouth daily.    [name] omeprazole (PRILOSEC OTC) 20 MG tablet Take 20 mg by mouth daily.     No current facility-administered medications for this visit.    Allergies:    Allergies  Allergen Reactions  . Statins Other (See Comments)    unspecified    Social History:  The patient  reports that he quit smoking about 12 months ago. His smoking use included Cigars and Cigarettes. He smoked 0.00 packs per day for 58 years. He has never used smokeless tobacco. He reports that he does not drink alcohol or use illicit drugs.   Family history:  History reviewed. No pertinent family history.  ROS:  Please see the history of present illness.  All other systems reviewed and negative.   PHYSICAL EXAM: VS:  BP 130/70 mmHg  Pulse 76  Ht 5' 7.5" (1.715 m)  Wt 168 lb (76.204 kg)  BMI 25.91 kg/m2 Well nourished, well developed, in no acute distress HEENT: Pupils are equal round react to light accommodation extraocular movements are intact.  Neck: no JVDNo cervical lymphadenopathy. Cardiac: Regular rate and rhythm without murmurs rubs or gallops. Lungs:  clear to auscultation bilaterally, no wheezing, rhonchi or rales Ext: no lower extremity edema.  2+ radial and dorsalis pedis pulses. 2+ right posterior tibialis. 1+ on the left.  Left groin is mildly tender. There is no hematoma or ecchymosis. Skin: warm and dry Neuro:  Grossly normal   ASSESSMENT AND PLAN:  Problem List Items Addressed This Visit    Claudication of left lower extremity    The patient is currently not complaining of claudication in this leg    Hypertension    Pressure well-controlled.    PVD (peripheral vascular disease) - Primary    Follow-up Dopplers show  improvement in right ABI 0.98. Patient's claudication has resolved.  He is on aspirin Plavix

## 2014-11-02 NOTE — Patient Instructions (Signed)
Your physician wants you to follow-up in: [place] will receive a reminder letter in the mail two months in advance. If you don't receive a letter, please call our office to schedule the follow-up appointment.

## 2014-11-02 NOTE — Assessment & Plan Note (Signed)
Pressure well controlled.

## 2014-11-02 NOTE — Assessment & Plan Note (Signed)
The patient is currently not complaining of claudication in this leg

## 2014-11-18 ENCOUNTER — Encounter (HOSPITAL_COMMUNITY): Payer: Self-pay | Admitting: Cardiovascular Disease

## 2014-12-06 ENCOUNTER — Other Ambulatory Visit: Payer: Self-pay | Admitting: Cardiovascular Disease

## 2014-12-06 NOTE — Telephone Encounter (Signed)
Rx refill sent to patient pharmacy   

## 2014-12-11 ENCOUNTER — Other Ambulatory Visit: Payer: Self-pay | Admitting: Cardiovascular Disease

## 2014-12-13 NOTE — Telephone Encounter (Signed)
Rx(s) sent to pharmacy electronically.  

## 2014-12-13 NOTE — Telephone Encounter (Signed)
Pt need his generic Plavix.Please call to [facility] on [address].

## 2014-12-14 ENCOUNTER — Other Ambulatory Visit: Payer: Self-pay | Admitting: *Deleted

## 2014-12-14 MED ORDER — CLOPIDOGREL BISULFATE 75 MG PO TABS: 75.0000 mg | ORAL_TABLET | Freq: Every day | ORAL | Status: DC

## 2015-02-01 ENCOUNTER — Other Ambulatory Visit (HOSPITAL_COMMUNITY): Payer: Self-pay | Admitting: Cardiovascular Disease

## 2015-02-01 NOTE — Telephone Encounter (Signed)
Rx has been sent to the pharmacy electronically. ° °

## 2015-04-25 ENCOUNTER — Other Ambulatory Visit: Payer: Self-pay | Admitting: Cardiovascular Disease

## 2015-04-25 DIAGNOSIS — I739 Peripheral vascular disease, unspecified: Secondary | ICD-10-CM

## 2015-05-24 ENCOUNTER — Ambulatory Visit (HOSPITAL_COMMUNITY)
Admission: RE | Admit: 2015-05-24 | Discharge: 2015-05-24 | Disposition: A | Discharge status: Home / Self Care | Payer: Medicare Other | Source: Ambulatory Visit | Attending: Internal Medicine | Admitting: Internal Medicine

## 2015-05-24 DIAGNOSIS — I739 Peripheral vascular disease, unspecified: Secondary | ICD-10-CM | POA: Diagnosis not present

## 2015-06-01 ENCOUNTER — Telehealth: Payer: Self-pay | Admitting: Cardiovascular Disease

## 2015-06-01 NOTE — Telephone Encounter (Signed)
Received call back from patient.Dr.Berry wants to discuss lower ext dopplers at next available appointment.Appointment was offered 06/07/15,but unable to come.Advised scheduler will call back to schedule appointment with Dr.Berry.

## 2015-06-01 NOTE — Telephone Encounter (Signed)
Returned call to patient no answer.LMTC. 

## 2015-06-01 NOTE — Telephone Encounter (Signed)
Pt is returning Kathryn's call in regards to his results to his doppler that was done on 6/14. Please f/u   Thanks

## 2015-06-17 ENCOUNTER — Ambulatory Visit (INDEPENDENT_AMBULATORY_CARE_PROVIDER_SITE_OTHER): Payer: Medicare Other | Admitting: Cardiovascular Disease

## 2015-06-17 ENCOUNTER — Encounter: Payer: Self-pay | Admitting: Cardiovascular Disease

## 2015-06-17 VITALS — BP 146/74 | HR 68 | Ht 67.5 in | Wt 168.0 lb

## 2015-06-17 DIAGNOSIS — E785 Hyperlipidemia, unspecified: Secondary | ICD-10-CM

## 2015-06-17 DIAGNOSIS — R0989 Other specified symptoms and signs involving the circulatory and respiratory systems: Secondary | ICD-10-CM

## 2015-06-17 DIAGNOSIS — Z79899 Other long term (current) drug therapy: Secondary | ICD-10-CM

## 2015-06-17 DIAGNOSIS — I779 Disorder of arteries and arterioles, unspecified: Secondary | ICD-10-CM

## 2015-06-17 DIAGNOSIS — I739 Peripheral vascular disease, unspecified: Secondary | ICD-10-CM | POA: Diagnosis not present

## 2015-06-17 MED ORDER — LOVASTATIN 20 MG PO TABS: 20.0000 mg | ORAL_TABLET | Freq: Every day | ORAL | Status: DC

## 2015-06-17 NOTE — Progress Notes (Signed)
06/17/2015 Joshua Wyatt   Apr 07, 1946  [id number]  Primary Physician Thressa Sheller, MD Primary Cardiologist: Lorretta Harp MD Renae Gloss   HPI:  The patient is a 69 year old thin appearing married Caucasian male with no children who I last saw in the office 08/18/14.[name] He has a history of PVOD status post bilateral carotid endarterectomies performed by Dr. Drucie Opitz in 2007. We have been following him by duplex ultrasound which performed most recently several days ago revealed this to be widely patent. He has also had bilateral SFA intervention as well as right renal artery stenting. His other problems include hypertension and hyperlipidemia. He did have an episode of chest heaviness recently, but more importantly he has complained of progressive lifestyle limiting claudication since I last saw him. Most recent lower extremity Dopplers reveal a right ABI of 0.93 and a left of 0.82. He does appear to have moderate iliac disease as well as high grade right SFA and popliteal stenosis. He underwent abdominal aortography with bifemoral runoff on 03/30/13 revealed a widely patent right renal artery stent, 50-60% proximal left renal artery stenosis. The left SFA was while he stayed patent and he had a 95% focal above-the-knee popliteal artery stenosis with three-vessel runoff. There was a 60% focal lesion in the right common iliac artery and a 50% segmental proximal to mid right SFA stenosis with a patent stent in the mid to distal right SFA. He also had a 50% right popliteal stenosis with three-vessel runoff. He underwent cutting balloon angioplasty of the left above-the-knee popliteal artery and Angiosculpt balloon with an excellent angiographic result and was discharged the following day. I performed angiography on him 10/11/14 and recanalized a subtotally occluded right SFA with overlapping via Bahn covered stent performed intervention on his above-the-knee popliteal artery on that side. He  had a patent mid to distal left SFA stent with high grade segmental and 6 sequential disease in the proximal SFA and distal SFA on the left. He does complain of some mild left calf claudication.    Current Outpatient Prescriptions  Medication Sig Dispense Refill  . aspirin EC 81 MG tablet Take 81 mg by mouth daily.    . Biotin 1000 MCG tablet Take 1,000 mcg by mouth daily.    . clopidogrel (PLAVIX) 75 MG tablet Take 1 tablet (75 mg total) by mouth daily. 30 tablet 8  . diltiazem (DILACOR XR) 240 MG 24 hr capsule TAKE ONE CAPSULE BY MOUTH ONCE DAILY 30 capsule 10  . doxazosin (CARDURA) 2 MG tablet Take 1 tablet (2 mg total) by mouth at bedtime. 30 tablet 11  . losartan-hydrochlorothiazide (HYZAAR) 100-12.5 MG per tablet TAKE ONE TABLET BY MOUTH ONCE DAILY 90 tablet 2  . omeprazole (PRILOSEC OTC) 20 MG tablet Take 20 mg by mouth daily.    [name] lovastatin (MEVACOR) 20 MG tablet Take 1 tablet (20 mg total) by mouth at bedtime. 30 tablet 5   No current facility-administered medications for this visit.    Allergies  Allergen Reactions  . Statins Other (See Comments)    unspecified    History   Social History  . Marital Status: Married    Spouse Name: N/A  . Number of Children: N/A  . Years of Education: N/A   Occupational History  . Not on file.   Social History Main Topics  . Smoking status: Former Smoker -- 74 years    Types: 52, Cigarettes    Quit date: 10/18/2013  . Smokeless tobacco: Never  Used  . Alcohol Use: No  . Drug Use: No  . Sexual Activity: Yes     Comment: RARE  [employer]   Other Topics Concern  . Not on file   Social History Narrative     Review of Systems: General: negative for chills, fever, night sweats or weight changes.  Cardiovascular: negative for chest pain, dyspnea on exertion, edema, orthopnea, palpitations, paroxysmal nocturnal dyspnea or shortness of breath Dermatological: negative for rash Respiratory: negative for cough or wheezing Urologic:  negative for hematuria Abdominal: negative for nausea, vomiting, diarrhea, bright red blood per rectum, melena, or hematemesis Neurologic: negative for visual changes, syncope, or dizziness All other systems reviewed and are otherwise negative except as noted above.    Blood pressure 146/74, pulse 68, height 5' 7.5" (1.715 m), weight 168 lb (76.204 kg).  General appearance: alert and no distress Neck: no adenopathy, no JVD, supple, symmetrical, trachea midline, thyroid not enlarged, symmetric, no tenderness/mass/nodules and soft bilateral carotid bruits Lungs: clear to auscultation bilaterally Heart: regular rate and rhythm, S1, S2 normal, no murmur, click, rub or gallop Extremities: extremities normal, atraumatic, no cyanosis or edema  EKG not performed today  ASSESSMENT AND PLAN:   PVD (peripheral vascular disease) History of peripheral arterial disease status post left SFA intervention by myself 10/27/08 followed by right SFA intervention 12/28/08 at which time his right renal artery was stented as well. Because of recurrent symptoms he underwent angiography by myself 10/11/14 revealing a diffusely diseased proximal right SFA which was subtotally occluded down to beyond the stented segment OF 80% stenosis in the P3 segment of the popliteal artery. I restented his SFA with overlapping via Bahn covered stents and performed cutting balloon angioplasty of the popliteal. He did have 50-60% bilateral iliac stenoses as well as high-grade proximal mid and distal left SFA stenosis. He is a patent right renal artery stent and 78% left renal artery stenosis. His Dopplers improved with right ABI going from 0.23 up to 0.87. His left ABI is 0.57 with a high-frequency signal in his distal left SFA. He does complain of some mild left lacunar claudication however at this time because of fiscal constraints, he does not wish to pursue angiography and intervention.  Hypertension History of hypertension blood  pressure measured at 146/74. He is on losartan and hydrochlorothiazide as well as diltiazem. Continue current meds at current dosing  Hyperlipidemia History of hyperlipidemia intolerant to statin drugs. I am going to try him on lovastatin 20 mg a day as well as Co Q 10. We will recheck a lipid and liver profile in 2 months.      Lorretta Harp MD FACP,FACC,FAHA, [facility] 06/17/2015 3:58 PM

## 2015-06-17 NOTE — Assessment & Plan Note (Signed)
History of carotid artery disease status post bilateral carotid endarterectomies performed by Dr. Erskin Burnet in 2007. Recent Dopplers performed 03/02/13 revealed his endarterectomy sites to be widely patent.

## 2015-06-17 NOTE — Assessment & Plan Note (Signed)
History of hypertension blood pressure measured at 146/74. He is on losartan and hydrochlorothiazide as well as diltiazem. Continue current meds at current dosing

## 2015-06-17 NOTE — Patient Instructions (Addendum)
Medication Instructions:  Dr Gwenlyn Found has recommended making the following medication changes: START Lovastatin 20 mg - take 1 tablet by mouth once daily. A new prescription has been sent to your pharmacy electronically. START Co-Q-10 200 mg - take 1 capsule/tablet daily. This is an over-the-counter medication.  Labwork: Your physician recommends that you return for lab work in 2 months - FASTING.  Testing/Procedures: Your physician has requested that you have a carotid duplex in December. This test is an ultrasound of the carotid arteries in your neck. It looks at blood flow through these arteries that supply the brain with blood. Allow one hour for this exam. There are no restrictions or special instructions.  Follow-Up: Dr Gwenlyn Found recommends that you schedule a follow-up appointment in 6 months. You will receive a reminder letter in the mail two months in advance. If you don't receive a letter, please call our office to schedule the follow-up appointment.

## 2015-06-17 NOTE — Assessment & Plan Note (Signed)
History of peripheral arterial disease status post left SFA intervention by myself 10/27/08 followed by right SFA intervention 12/28/08 at which time his right renal artery was stented as well. Because of recurrent symptoms he underwent angiography by myself 10/11/14 revealing a diffusely diseased proximal right SFA which was subtotally occluded down to beyond the stented segment OF 80% stenosis in the P3 segment of the popliteal artery. I restented his SFA with overlapping via Bahn covered stents and performed cutting balloon angioplasty of the popliteal. He did have 50-60% bilateral iliac stenoses as well as high-grade proximal mid and distal left SFA stenosis. He is a patent right renal artery stent and 78% left renal artery stenosis. His Dopplers improved with right ABI going from 0.23 up to 0.87. His left ABI is 0.57 with a high-frequency signal in his distal left SFA. He does complain of some mild left lacunar claudication however at this time because of fiscal constraints, he does not wish to pursue angiography and intervention.

## 2015-06-17 NOTE — Assessment & Plan Note (Signed)
History of hyperlipidemia intolerant to statin drugs. I am going to try him on lovastatin 20 mg a day as well as Co Q 10. We will recheck a lipid and liver profile in 2 months.

## 2015-09-30 DIAGNOSIS — J40 Bronchitis, not specified as acute or chronic: Secondary | ICD-10-CM | POA: Diagnosis not present

## 2015-09-30 DIAGNOSIS — J209 Acute bronchitis, unspecified: Secondary | ICD-10-CM | POA: Diagnosis not present

## 2015-11-01 DIAGNOSIS — I10 Essential (primary) hypertension: Secondary | ICD-10-CM | POA: Diagnosis not present

## 2015-11-01 DIAGNOSIS — Z0000 Encounter for general adult medical examination without abnormal findings: Secondary | ICD-10-CM | POA: Diagnosis not present

## 2015-11-01 DIAGNOSIS — E785 Hyperlipidemia, unspecified: Secondary | ICD-10-CM | POA: Diagnosis not present

## 2015-11-07 ENCOUNTER — Other Ambulatory Visit: Payer: Self-pay | Admitting: Cardiovascular Disease

## 2015-11-11 ENCOUNTER — Other Ambulatory Visit: Payer: Self-pay | Admitting: Cardiovascular Disease

## 2015-11-11 DIAGNOSIS — E7800 Pure hypercholesterolemia, unspecified: Secondary | ICD-10-CM | POA: Diagnosis not present

## 2015-11-11 DIAGNOSIS — Z0000 Encounter for general adult medical examination without abnormal findings: Secondary | ICD-10-CM | POA: Diagnosis not present

## 2015-11-11 DIAGNOSIS — R634 Abnormal weight loss: Secondary | ICD-10-CM | POA: Diagnosis not present

## 2015-11-11 DIAGNOSIS — E871 Hypo-osmolality and hyponatremia: Secondary | ICD-10-CM | POA: Diagnosis not present

## 2015-11-11 DIAGNOSIS — I10 Essential (primary) hypertension: Secondary | ICD-10-CM | POA: Diagnosis not present

## 2015-11-11 DIAGNOSIS — I739 Peripheral vascular disease, unspecified: Secondary | ICD-10-CM

## 2015-11-23 ENCOUNTER — Ambulatory Visit (HOSPITAL_COMMUNITY)
Admission: RE | Admit: 2015-11-23 | Discharge: 2015-11-23 | Disposition: A | Discharge status: Home / Self Care | Payer: Medicare Other | Source: Ambulatory Visit | Attending: Cardiology | Admitting: Cardiology

## 2015-11-23 DIAGNOSIS — E785 Hyperlipidemia, unspecified: Secondary | ICD-10-CM | POA: Insufficient documentation

## 2015-11-23 DIAGNOSIS — I70201 Unspecified atherosclerosis of native arteries of extremities, right leg: Secondary | ICD-10-CM | POA: Diagnosis not present

## 2015-11-23 DIAGNOSIS — I739 Peripheral vascular disease, unspecified: Secondary | ICD-10-CM | POA: Diagnosis not present

## 2015-11-23 DIAGNOSIS — I10 Essential (primary) hypertension: Secondary | ICD-10-CM | POA: Diagnosis not present

## 2015-11-24 DIAGNOSIS — I10 Essential (primary) hypertension: Secondary | ICD-10-CM | POA: Diagnosis not present

## 2015-11-24 DIAGNOSIS — E871 Hypo-osmolality and hyponatremia: Secondary | ICD-10-CM | POA: Diagnosis not present

## 2016-01-04 DIAGNOSIS — E875 Hyperkalemia: Secondary | ICD-10-CM | POA: Diagnosis not present

## 2016-01-28 ENCOUNTER — Other Ambulatory Visit: Payer: Self-pay | Admitting: Cardiovascular Disease

## 2016-01-30 NOTE — Telephone Encounter (Signed)
Rx(s) sent to pharmacy electronically.  

## 2016-02-15 DIAGNOSIS — J309 Allergic rhinitis, unspecified: Secondary | ICD-10-CM | POA: Diagnosis not present

## 2016-02-27 DIAGNOSIS — L918 Other hypertrophic disorders of the skin: Secondary | ICD-10-CM | POA: Diagnosis not present

## 2016-02-27 DIAGNOSIS — Z85828 Personal history of other malignant neoplasm of skin: Secondary | ICD-10-CM | POA: Diagnosis not present

## 2016-02-27 DIAGNOSIS — L820 Inflamed seborrheic keratosis: Secondary | ICD-10-CM | POA: Diagnosis not present

## 2016-02-27 DIAGNOSIS — D485 Neoplasm of uncertain behavior of skin: Secondary | ICD-10-CM | POA: Diagnosis not present

## 2016-02-27 DIAGNOSIS — L821 Other seborrheic keratosis: Secondary | ICD-10-CM | POA: Diagnosis not present

## 2016-04-16 ENCOUNTER — Ambulatory Visit
Admission: RE | Admit: 2016-04-16 | Discharge: 2016-04-16 | Disposition: A | Discharge status: Home / Self Care | Payer: Medicare Other | Source: Ambulatory Visit | Attending: Registered Nurse | Admitting: Registered Nurse

## 2016-04-16 ENCOUNTER — Other Ambulatory Visit: Payer: Self-pay | Admitting: Registered Nurse

## 2016-04-16 DIAGNOSIS — Z7901 Long term (current) use of anticoagulants: Secondary | ICD-10-CM | POA: Diagnosis not present

## 2016-04-16 DIAGNOSIS — H538 Other visual disturbances: Secondary | ICD-10-CM | POA: Diagnosis not present

## 2016-04-16 DIAGNOSIS — S0990XA Unspecified injury of head, initial encounter: Secondary | ICD-10-CM | POA: Diagnosis not present

## 2016-04-16 DIAGNOSIS — R51 Headache: Secondary | ICD-10-CM

## 2016-04-16 DIAGNOSIS — R519 Headache, unspecified: Secondary | ICD-10-CM

## 2016-04-16 DIAGNOSIS — W19XXXA Unspecified fall, initial encounter: Secondary | ICD-10-CM | POA: Diagnosis not present

## 2016-04-16 IMAGING — CT CT HEAD W/O CM
2 series · 17 of 30 positions shown, 20 images · non-contrast
Comparison: None.

CLINICAL DATA: Fall.  Hit back of head.

EXAM:
CT HEAD WITHOUT CONTRAST
TECHNIQUE: Contiguous axial images were obtained from the base of the skull
through the vertex without intravenous contrast.

[Series 2: head w/(date) · axial · 0.45mm/px · z∈[-166,-61]mm · 9 of 27 slices shown, 12 images]
[im 3/27  brain]
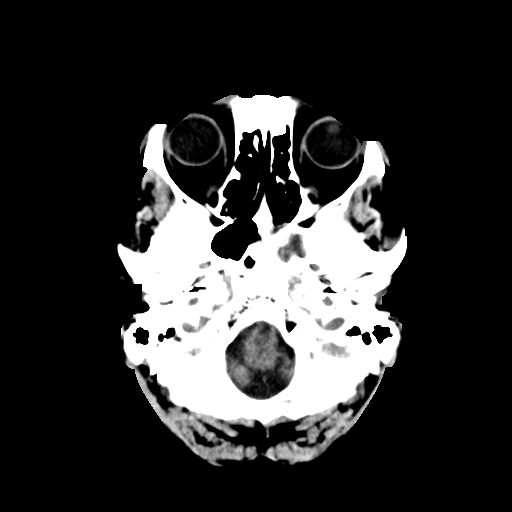
[im 3/27  bone]
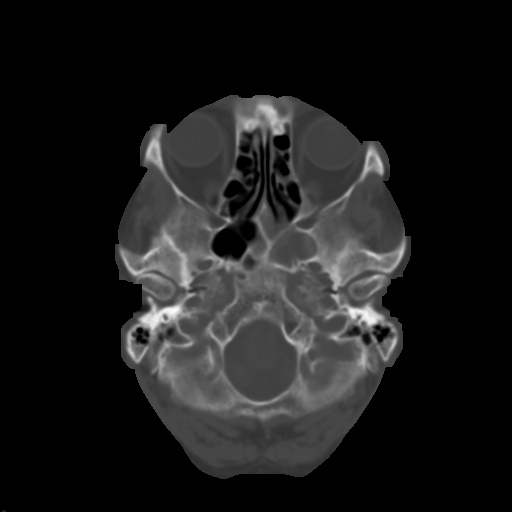
[im 6/27  brain]
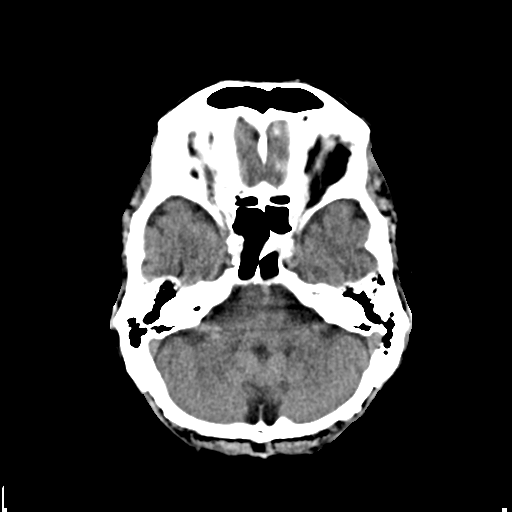
[im 8/27  brain]
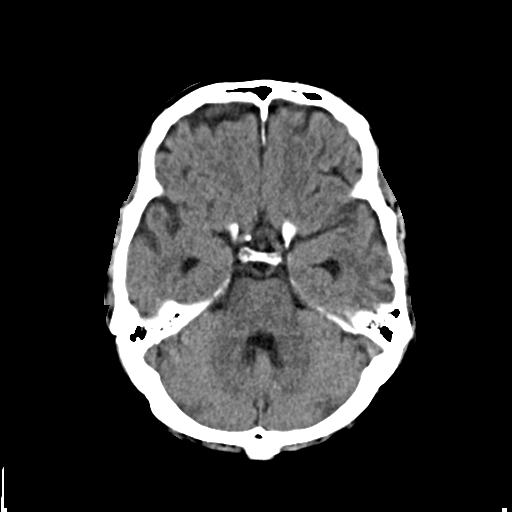
[im 11/27  brain]
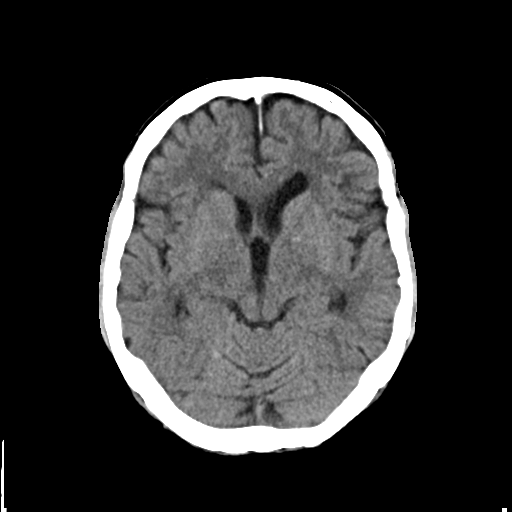
[im 14/27  brain]
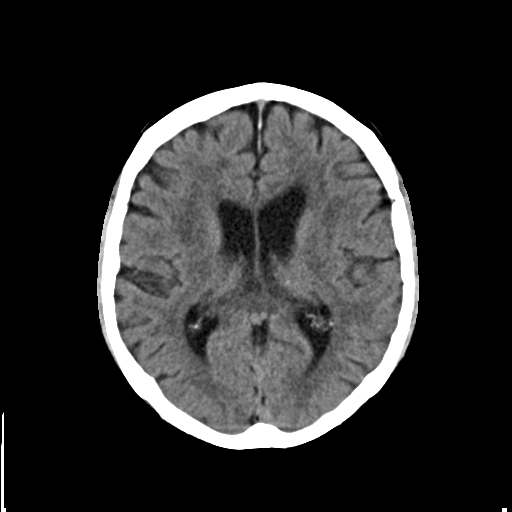
[im 14/27  bone]
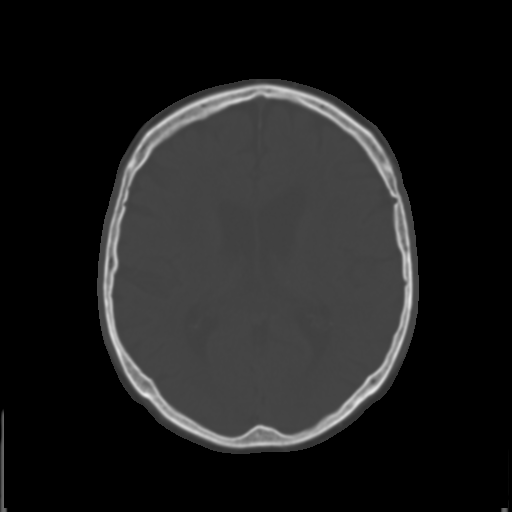
[im 16/27  brain]
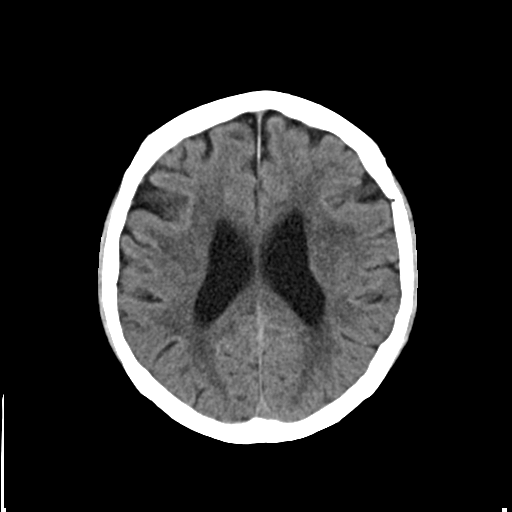
[im 19/27  brain]
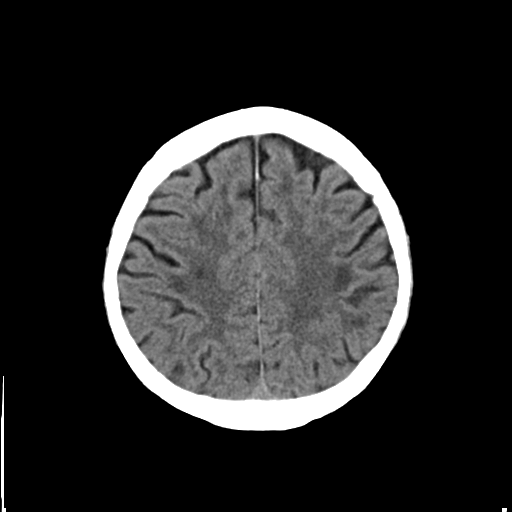
[im 21/27  brain]
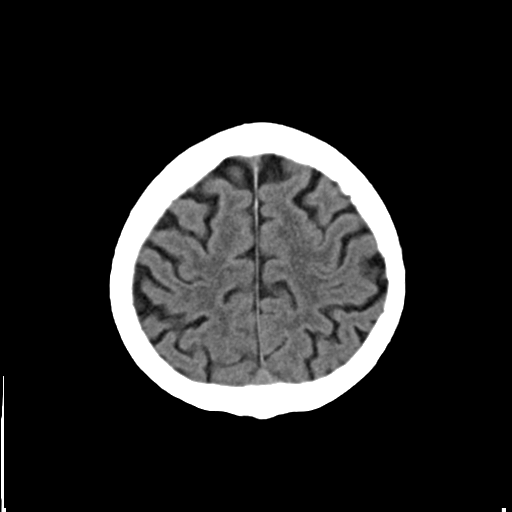
[im 24/27  brain]
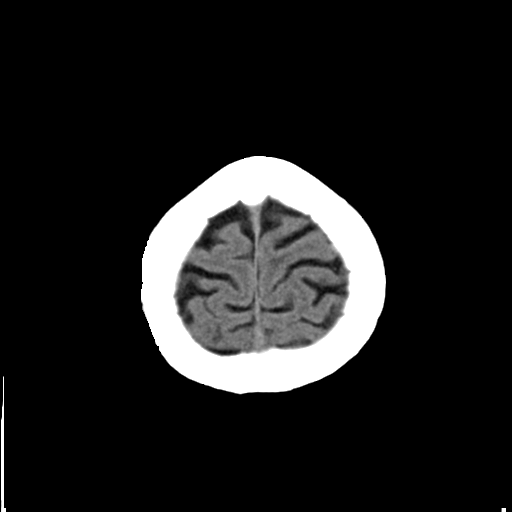
[im 24/27  bone]
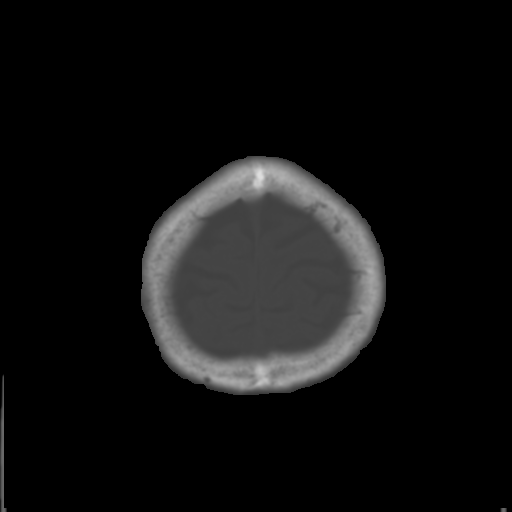

[Series 4: bone · axial · 0.45mm/px · z∈[-165,-60]mm · 8 of 45 slices shown]
[im 5/45  bone]
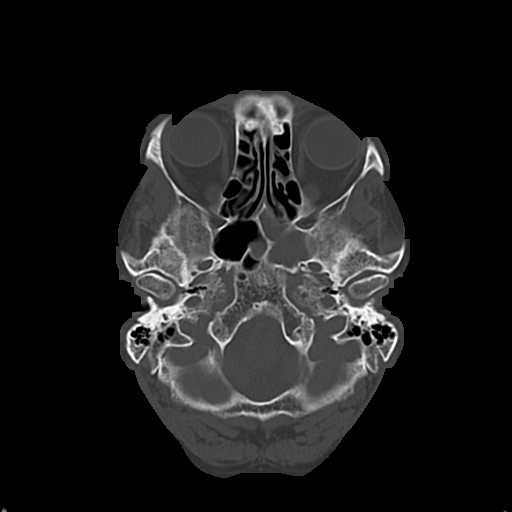
[im 10/45  bone]
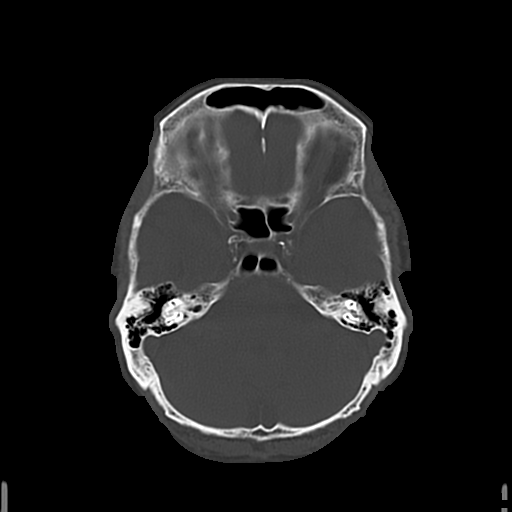
[im 15/45  bone]
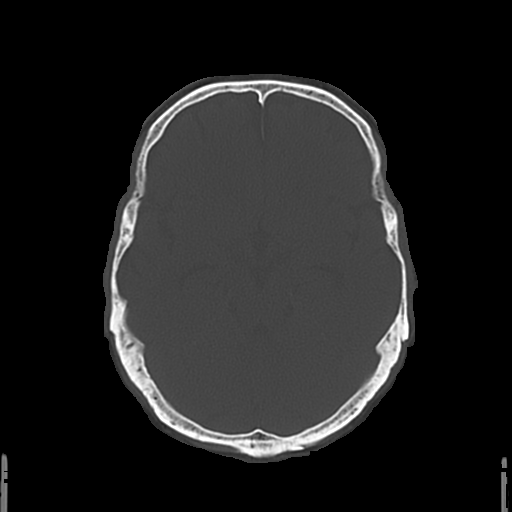
[im 20/45  bone]
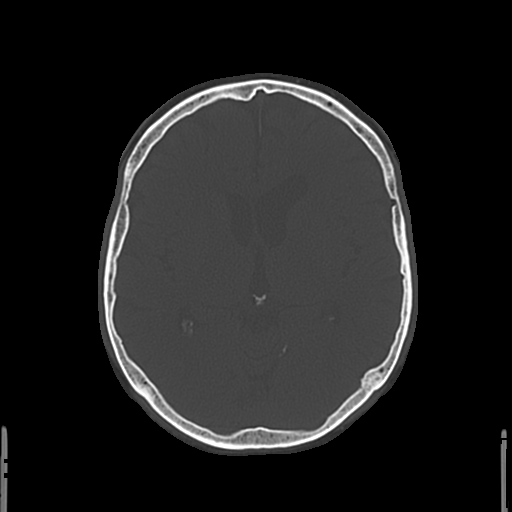
[im 25/45  bone]
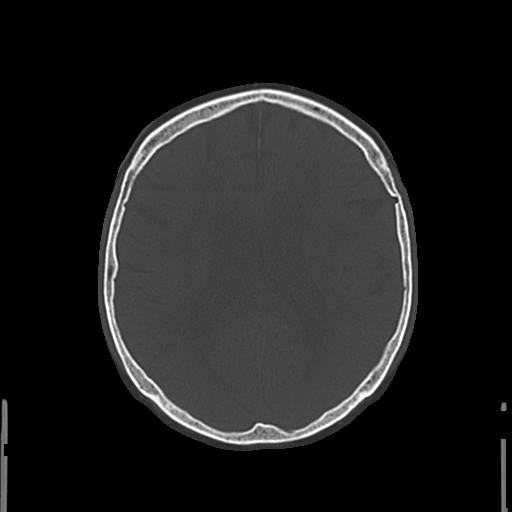
[im 30/45  bone]
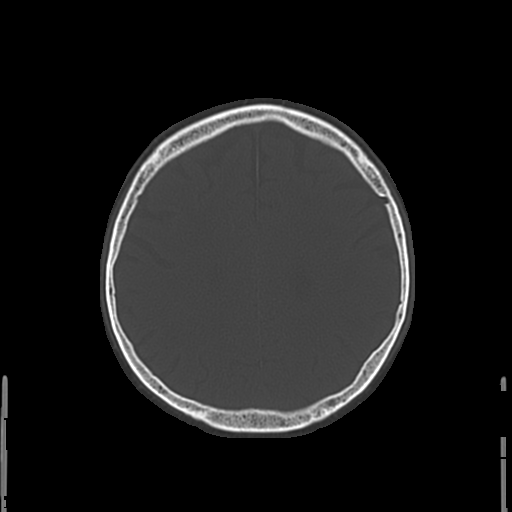
[im 35/45  bone]
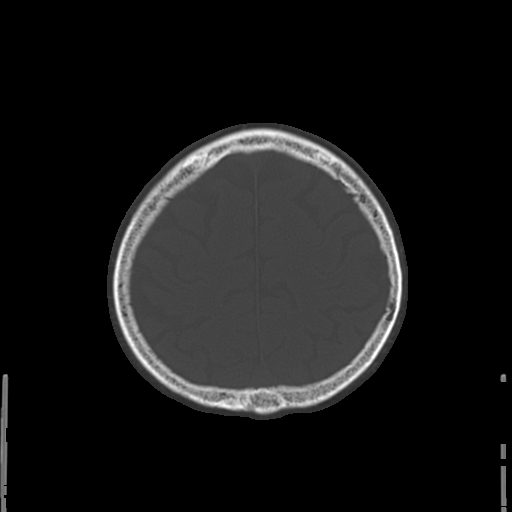
[im 40/45  bone]
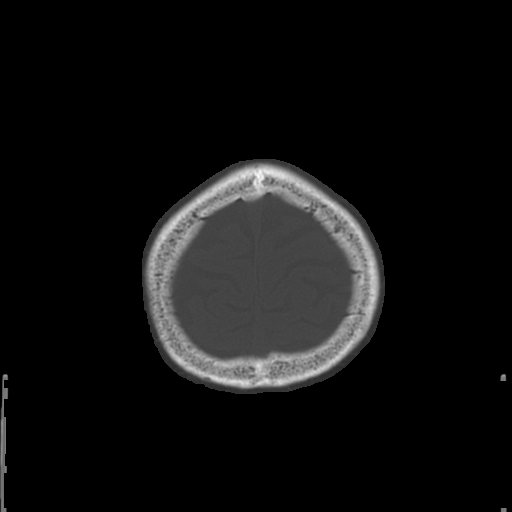

[17 of 30 positions shown; findings below may reference images not displayed]

FINDINGS: There is mild diffuse low-attenuation within the subcortical and
periventricular white matter compatible with chronic microvascular
disease. Prominence of the sulci and ventricles compatible with
brain atrophy. No evidence for acute brain infarct, acute
intracranial hemorrhage or mass.The mastoid air cells are clear.
Opacification of the sphenoid sinus is noted.
IMPRESSION: 1. Small vessel ischemic disease and brain atrophy.
2. No acute intracranial abnormality.

## 2016-04-17 ENCOUNTER — Other Ambulatory Visit: Payer: Self-pay | Admitting: Registered Nurse

## 2016-04-17 ENCOUNTER — Ambulatory Visit
Admission: RE | Admit: 2016-04-17 | Discharge: 2016-04-17 | Disposition: A | Discharge status: Home / Self Care | Payer: Medicare Other | Source: Ambulatory Visit | Attending: Registered Nurse | Admitting: Registered Nurse

## 2016-04-17 DIAGNOSIS — M25552 Pain in left hip: Secondary | ICD-10-CM

## 2016-04-17 DIAGNOSIS — M25852 Other specified joint disorders, left hip: Secondary | ICD-10-CM | POA: Diagnosis not present

## 2016-04-17 IMAGING — CR DG HIP (WITH OR WITHOUT PELVIS) 2-3V*L*
2 series · 2 of 2 positions shown · non-contrast
Comparison: None in PACs

CLINICAL DATA: Status post fall 10 days ago with persistent altered
gait and limited range of motion of the left hip

EXAM:
DG HIP (WITH OR WITHOUT PELVIS) 2-3V LEFT

[w hip ap left]
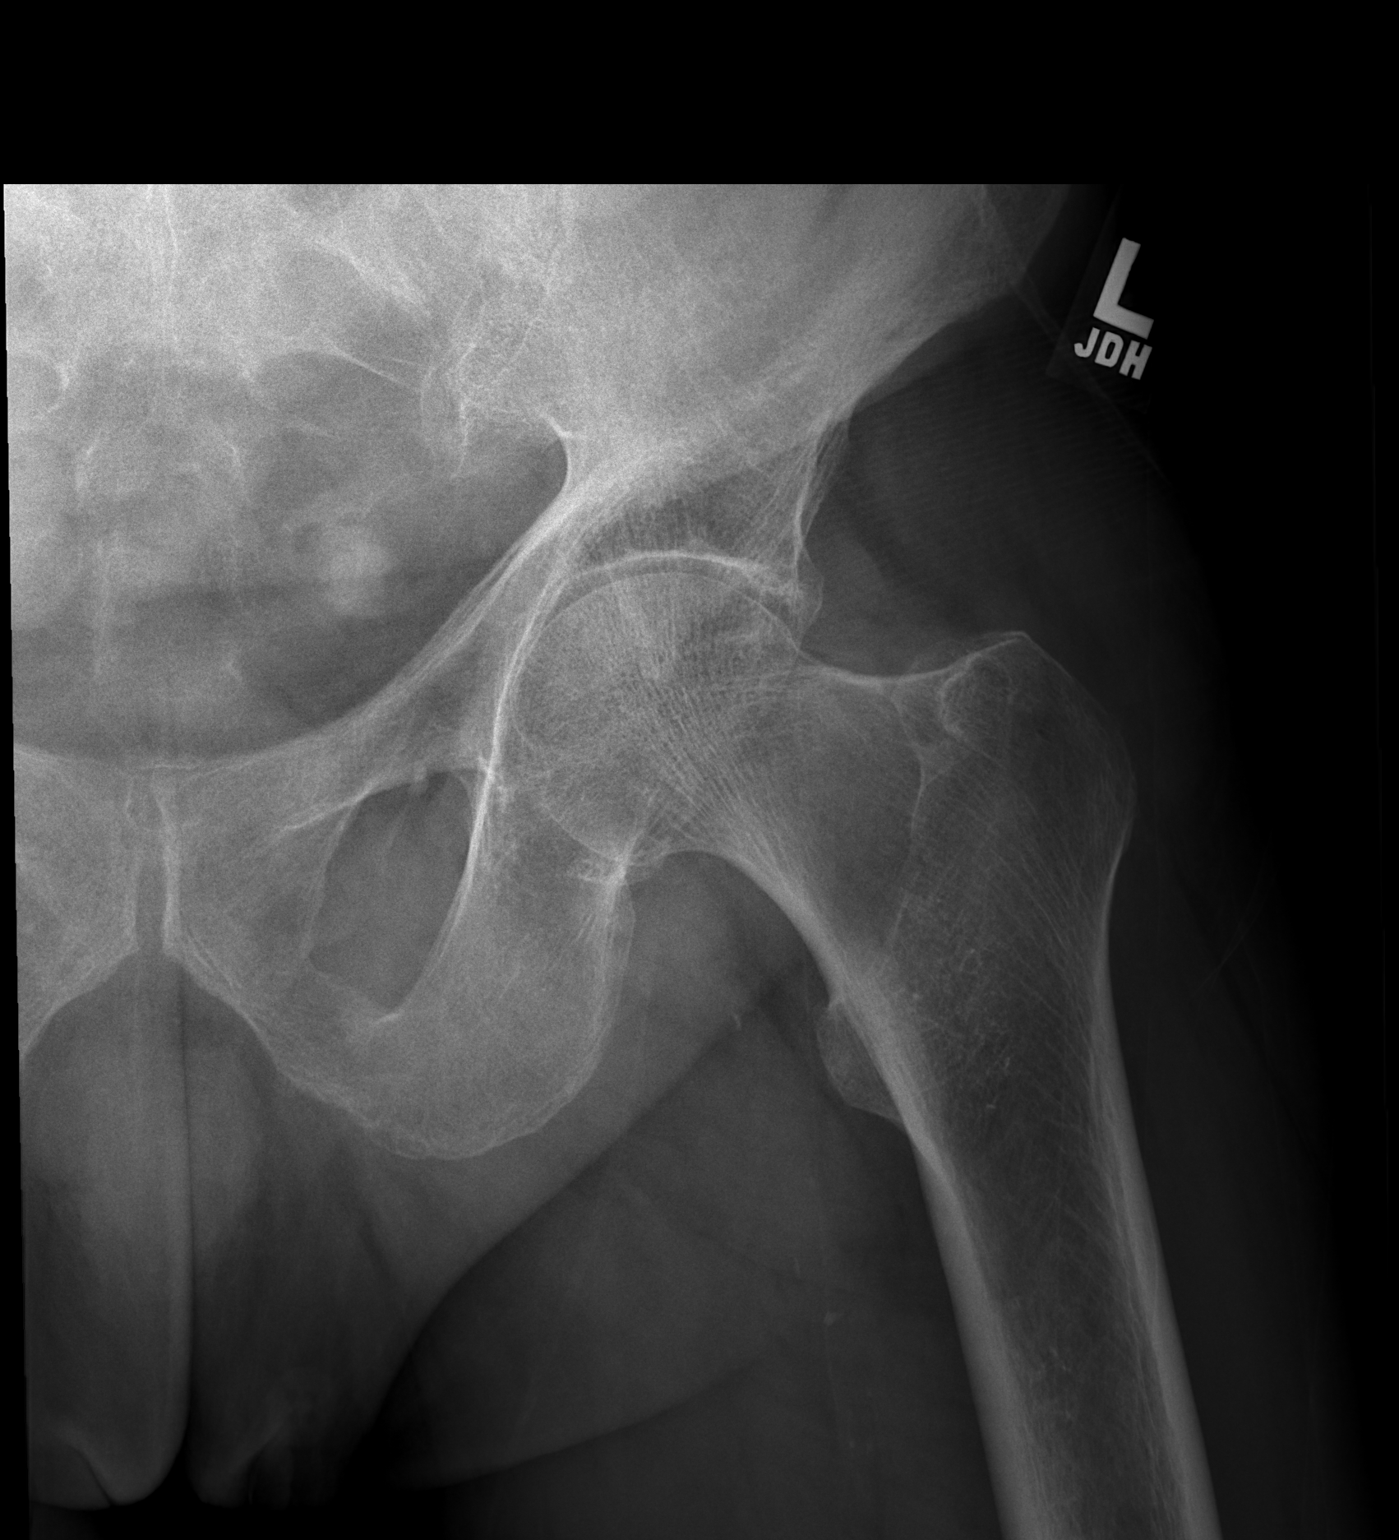

[w hip lat left]
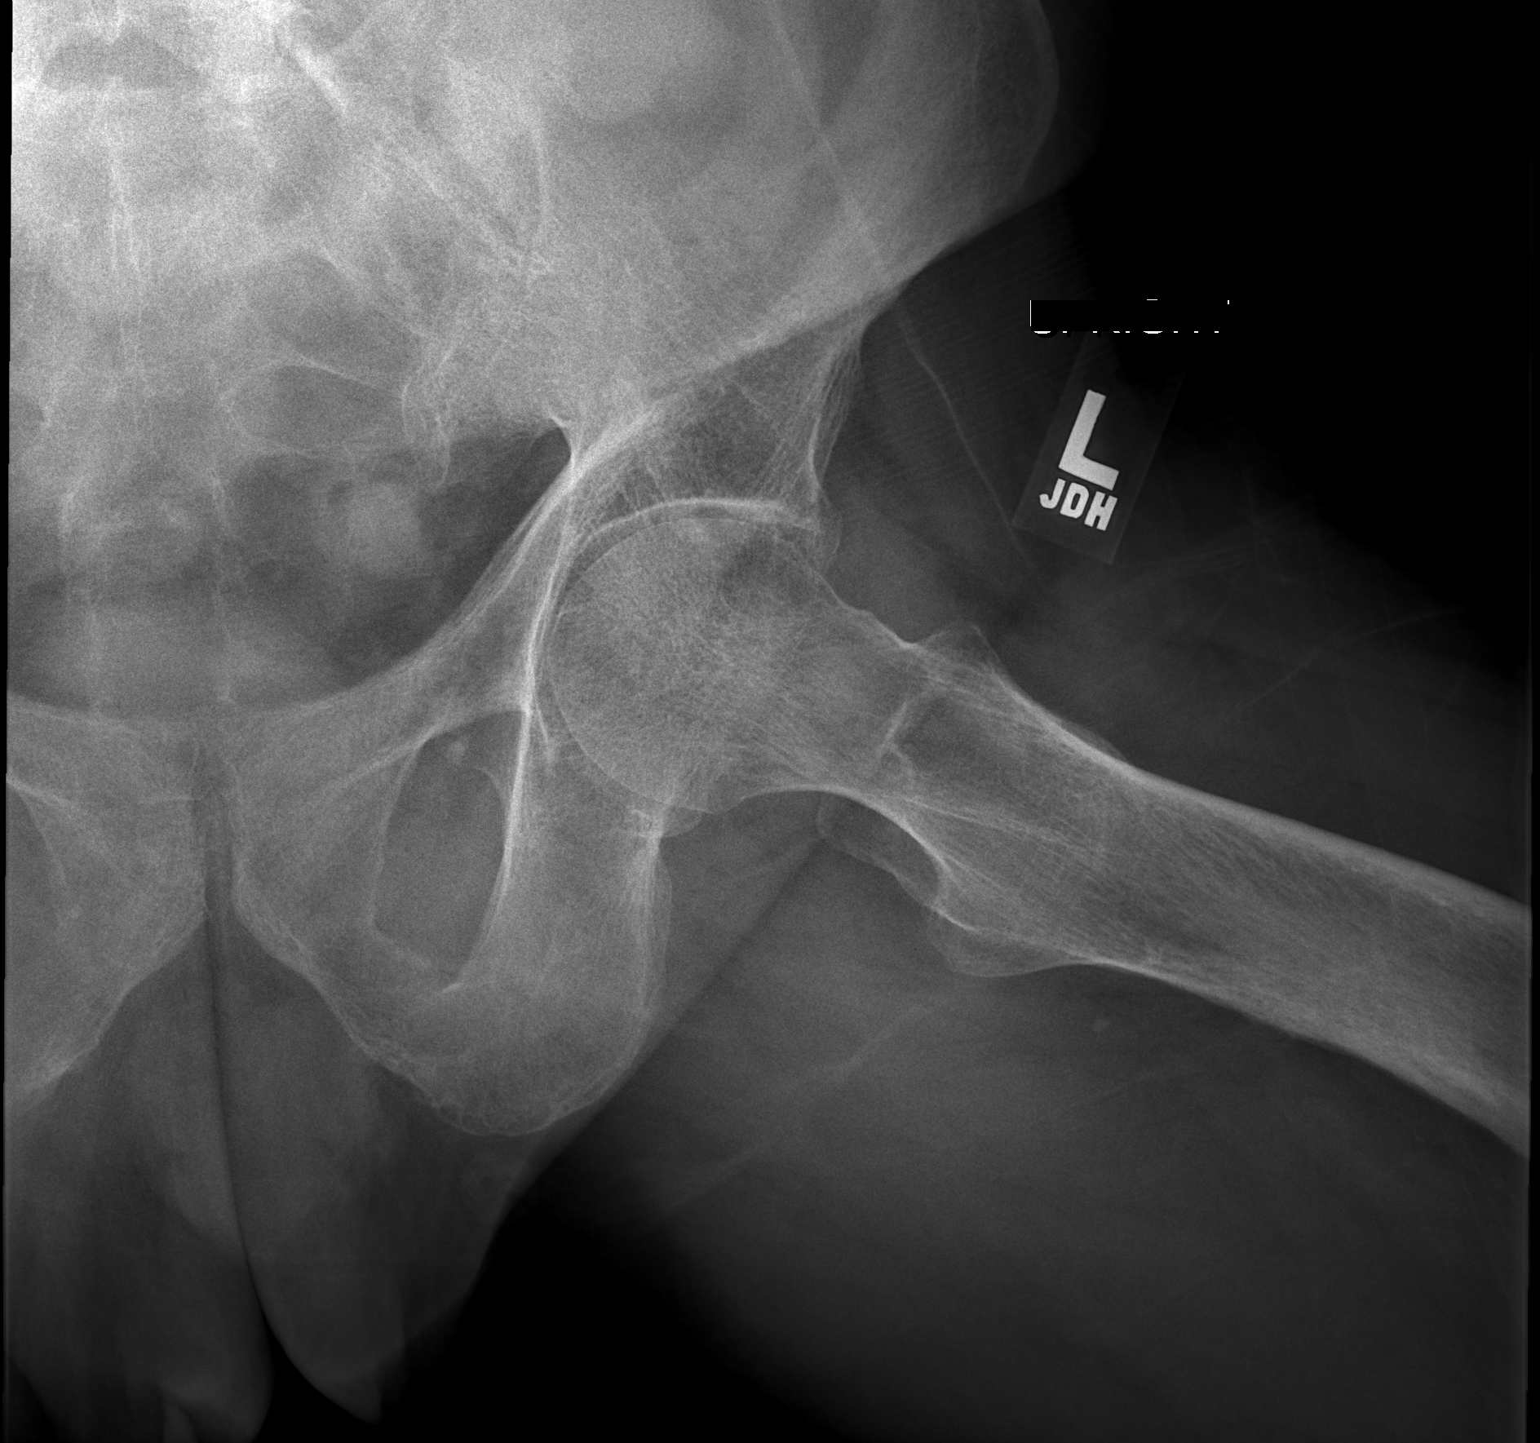

[2 of 2 positions shown; findings below may reference images not displayed]

FINDINGS: The observed portions of the left hemipelvis are normal. There is
moderate symmetric narrowing of the left hip joint space. The
articular surfaces of the femoral head and acetabulum remains
smoothly rounded. The femoral neck, intertrochanteric, and
subtrochanteric regions are normal.
IMPRESSION: There is moderate osteoarthritic joint space loss of the left hip.
There is no acute or healing fracture. These results will be called
to the ordering clinician or representative by the Radiologist
Assistant, and communication documented in the PACS or zVision
Dashboard.

## 2016-06-01 DIAGNOSIS — E785 Hyperlipidemia, unspecified: Secondary | ICD-10-CM | POA: Diagnosis not present

## 2016-06-01 DIAGNOSIS — I739 Peripheral vascular disease, unspecified: Secondary | ICD-10-CM | POA: Diagnosis not present

## 2016-06-01 DIAGNOSIS — N182 Chronic kidney disease, stage 2 (mild): Secondary | ICD-10-CM | POA: Diagnosis not present

## 2016-06-01 DIAGNOSIS — N400 Enlarged prostate without lower urinary tract symptoms: Secondary | ICD-10-CM | POA: Diagnosis not present

## 2016-06-01 DIAGNOSIS — I129 Hypertensive chronic kidney disease with stage 1 through stage 4 chronic kidney disease, or unspecified chronic kidney disease: Secondary | ICD-10-CM | POA: Diagnosis not present

## 2016-06-04 ENCOUNTER — Other Ambulatory Visit (HOSPITAL_COMMUNITY): Payer: Self-pay | Admitting: Internal Medicine

## 2016-06-04 DIAGNOSIS — R011 Cardiac murmur, unspecified: Secondary | ICD-10-CM

## 2016-06-06 ENCOUNTER — Ambulatory Visit (HOSPITAL_COMMUNITY): Payer: Medicare Other | Attending: Cardiology

## 2016-06-06 ENCOUNTER — Other Ambulatory Visit: Payer: Self-pay

## 2016-06-06 DIAGNOSIS — I119 Hypertensive heart disease without heart failure: Secondary | ICD-10-CM | POA: Insufficient documentation

## 2016-06-06 DIAGNOSIS — I350 Nonrheumatic aortic (valve) stenosis: Secondary | ICD-10-CM | POA: Insufficient documentation

## 2016-06-06 DIAGNOSIS — E785 Hyperlipidemia, unspecified: Secondary | ICD-10-CM | POA: Diagnosis not present

## 2016-06-06 DIAGNOSIS — I340 Nonrheumatic mitral (valve) insufficiency: Secondary | ICD-10-CM | POA: Insufficient documentation

## 2016-06-06 DIAGNOSIS — R011 Cardiac murmur, unspecified: Secondary | ICD-10-CM | POA: Diagnosis not present

## 2016-06-06 LAB — ECHOCARDIOGRAM COMPLETE
AO mean calculated velocity dopler: 162 cm/s
AOPV: 0.45 m/s
AV Area VTI index: 0.67 cm2/m2
AV Area VTI: 1.41 cm2
AV Area mean vel: 1.27 cm2
AV Mean grad: 12 mmHg
AV Peak grad: 25 mmHg
AV area mean vel ind: 0.66 cm2/m2
AV peak Index: 0.74
AV pk vel: 250 cm/s
AV vel: 1.29
AVCELMEANRAT: 0.4
Ao-asc: 32 cm
CHL CUP DOP CALC LVOT VTI: 22.8 cm
CHL CUP MV DEC (S): 345
E decel time: 345 msec
E/e' ratio: 9.99
FS: 32 % (ref 28–44)
IV/PV OW: 1.13
LA ID, A-P, ES: 46 mm
LA vol A4C: 57 ml
LA vol: 60 mL
LADIAMINDEX: 2.4 cm/m2
LAVOLIN: 31.3 mL/m2
LEFT ATRIUM END SYS DIAM: 46 mm
LV E/e' medial: 9.99
LV E/e'average: 9.99
LV PW d: 12 mm — AB (ref 0.6–1.1)
LV TDI E'MEDIAL: 5.36
LV dias vol index: 40 mL/m2
LV dias vol: 76 mL (ref 62–150)
LV e' LATERAL: 7.51 cm/s
LV sys vol index: 17 mL/m2
LVOT SV: 72 mL
LVOT area: 3.14 cm2
LVOT diameter: 20 mm
LVOT peak VTI: 0.41 cm
LVOT peak grad rest: 5 mmHg
LVOT peak vel: 112 cm/s
LVSYSVOL: 32 mL (ref 21–61)
Lateral S' vel: 12.7 cm/s
MV Peak grad: 2 mmHg
MV pk A vel: 80.9 m/s
MV pk E vel: 75 m/s
RV sys press: 30 mmHg
Reg peak vel: 258 cm/s
Simpson's disk: 58
Stroke v: 44 ml
TDI e' lateral: 7.51
TRMAXVEL: 258 cm/s
VTI: 55.4 cm
Valve area index: 0.67
Valve area: 1.29 cm2

## 2016-06-19 DIAGNOSIS — E785 Hyperlipidemia, unspecified: Secondary | ICD-10-CM | POA: Diagnosis not present

## 2016-08-17 ENCOUNTER — Telehealth: Payer: Self-pay | Admitting: *Deleted

## 2016-08-17 NOTE — Telephone Encounter (Signed)
Application for Disability Parking Placard filled out and signed by Dr Gwenlyn Found. Called pt and informed him it was complete. He will come by the office to pick up at his earliest convenience. Form left at front desk for pick up.

## 2016-08-20 ENCOUNTER — Other Ambulatory Visit: Payer: Self-pay | Admitting: Cardiovascular Disease

## 2016-08-20 DIAGNOSIS — H5203 Hypermetropia, bilateral: Secondary | ICD-10-CM | POA: Diagnosis not present

## 2016-08-20 DIAGNOSIS — H524 Presbyopia: Secondary | ICD-10-CM | POA: Diagnosis not present

## 2016-08-20 DIAGNOSIS — H52223 Regular astigmatism, bilateral: Secondary | ICD-10-CM | POA: Diagnosis not present

## 2016-08-20 DIAGNOSIS — D3132 Benign neoplasm of left choroid: Secondary | ICD-10-CM | POA: Diagnosis not present

## 2016-08-20 NOTE — Telephone Encounter (Signed)
REFILL 

## 2016-11-05 ENCOUNTER — Encounter: Payer: Self-pay | Admitting: *Deleted

## 2016-11-07 ENCOUNTER — Encounter: Payer: Self-pay | Admitting: Cardiovascular Disease

## 2016-11-07 ENCOUNTER — Ambulatory Visit (INDEPENDENT_AMBULATORY_CARE_PROVIDER_SITE_OTHER): Payer: Medicare Other | Admitting: Cardiovascular Disease

## 2016-11-07 VITALS — BP 146/84 | HR 85 | Ht 67.0 in | Wt 158.8 lb

## 2016-11-07 DIAGNOSIS — I701 Atherosclerosis of renal artery: Secondary | ICD-10-CM

## 2016-11-07 DIAGNOSIS — E7800 Pure hypercholesterolemia, unspecified: Secondary | ICD-10-CM

## 2016-11-07 DIAGNOSIS — I779 Disorder of arteries and arterioles, unspecified: Secondary | ICD-10-CM

## 2016-11-07 DIAGNOSIS — I739 Peripheral vascular disease, unspecified: Secondary | ICD-10-CM | POA: Diagnosis not present

## 2016-11-07 DIAGNOSIS — E785 Hyperlipidemia, unspecified: Secondary | ICD-10-CM

## 2016-11-07 DIAGNOSIS — I10 Essential (primary) hypertension: Secondary | ICD-10-CM

## 2016-11-07 NOTE — Assessment & Plan Note (Signed)
History of peripheral vascular disease status post bilateral SFA intervention most recently by myself 10/11/14 with overlapping bye-bye and covered stents to his proximal mid and distal right SFA. He did have intervention on his below-the-knee popliteal artery with 2 vessel runoff. He has history of 60% bilateral iliac disease as well as a patent right renal artery stent and 7080% left renal artery stenosis. He had high-grade proximal mid and distal left SFA stenosis with 2 vessel runoff. When I saw him in July 2016 he was complaining of mild left calf claudication. This has now progressed significantly to lifestyle limiting. His ABI in the left on 11/23/15 was 0.5. We will repeat his lower extremity arterial Doppler studies and plan on repeating angiography and potential intervention on him after the first ability of the year.

## 2016-11-07 NOTE — Assessment & Plan Note (Signed)
History of hyperlipidemia on statin therapy followed by his PCP 

## 2016-11-07 NOTE — Assessment & Plan Note (Signed)
History of carotid artery disease status post bilateral carotid endarterectomies performed by Dr. Drucie Opitz in 2007. His last carotid Dopplers performed 03/02/13 revealed widely patent endarterectomy sites. He does have bilateral carotid bruits. We will repeat carotid Doppler studies.

## 2016-11-07 NOTE — Assessment & Plan Note (Signed)
History of hypertension blood pressure measured 146/84. He is on diltiazem, losartan and hydrochlorothiazide. . Continue current meds at current dosing

## 2016-11-07 NOTE — Progress Notes (Signed)
11/07/2016 Joshua Wyatt   1946/09/08  [id number]  Primary Physician Thressa Sheller, MD Primary Cardiologist: Lorretta Harp MD Renae Gloss  HPI:   The patient is a 70 year old thin appearing married Caucasian male with no children who I last saw in the office 06/17/15.[name] He has a history of PVOD status post bilateral carotid endarterectomies performed by Dr. Drucie Opitz in 2007. We have been following him by duplex ultrasound which performed most recently several days ago revealed this to be widely patent. He has also had bilateral SFA intervention as well as right renal artery stenting. His other problems include hypertension and hyperlipidemia. He did have an episode of chest heaviness recently, but more importantly he has complained of progressive lifestyle limiting claudication since I last saw him. Most recent lower extremity Dopplers reveal a right ABI of 0.93 and a left of 0.82. He does appear to have moderate iliac disease as well as high grade right SFA and popliteal stenosis. He underwent abdominal aortography with bifemoral runoff on 03/30/13 revealed a widely patent right renal artery stent, 50-60% proximal left renal artery stenosis. The left SFA was while he stayed patent and he had a 95% focal above-the-knee popliteal artery stenosis with three-vessel runoff. There was a 60% focal lesion in the right common iliac artery and a 50% segmental proximal to mid right SFA stenosis with a patent stent in the mid to distal right SFA. He also had a 50% right popliteal stenosis with three-vessel runoff. He underwent cutting balloon angioplasty of the left above-the-knee popliteal artery and Angiosculpt balloon with an excellent angiographic result and was discharged the following day. I performed angiography on him 10/11/14 and recanalized a subtotally occluded right SFA with overlapping via Bahn covered stent performed intervention on his above-the-knee popliteal artery on that side. He  had a patent mid to distal left SFA stent with high grade segmental and 6 sequential disease in the proximal SFA and distal SFA on the left. Since I saw him a year and a half ago he's had progressive lifestyle limiting claudication in his left calf.   Current Outpatient Prescriptions  Medication Sig Dispense Refill  . aspirin EC 81 MG tablet Take 81 mg by mouth daily.    . Biotin 1000 MCG tablet Take 1,000 mcg by mouth daily.    . clopidogrel (PLAVIX) 75 MG tablet Take 1 tablet (75 mg total) by mouth daily. 30 tablet 5  . diltiazem (DILACOR XR) 240 MG 24 hr capsule Take 1 capsule (240 mg total) by mouth daily. NEED OV. 90 capsule 0  . losartan-hydrochlorothiazide (HYZAAR) 100-12.5 MG tablet TAKE ONE TABLET BY MOUTH ONCE DAILY 90 tablet 2  . lovastatin (MEVACOR) 20 MG tablet Take 1 tablet (20 mg total) by mouth at bedtime. 90 tablet 3  . omeprazole (PRILOSEC OTC) 20 MG tablet Take 20 mg by mouth daily.     No current facility-administered medications for this visit.     Allergies  Allergen Reactions  . Statins Other (See Comments)    unspecified    Social History   Social History  . Marital status: Married    Spouse name: N/A  . Number of children: N/A  . Years of education: N/A   Occupational History  . Not on file.   Social History Main Topics  . Smoking status: Former Smoker    Years: 58.00    Types: Cigars, Cigarettes    Quit date: 10/18/2013  . Smokeless tobacco: Never Used  .  Alcohol use No  . Drug use: No  . Sexual activity: Yes     Comment: RARE  CIGAR   Other Topics Concern  . Not on file   Social History Narrative  . No narrative on file     Review of Systems: General: negative for chills, fever, night sweats or weight changes.  Cardiovascular: negative for chest pain, dyspnea on exertion, edema, orthopnea, palpitations, paroxysmal nocturnal dyspnea or shortness of breath Dermatological: negative for rash Respiratory: negative for cough or  wheezing Urologic: negative for hematuria Abdominal: negative for nausea, vomiting, diarrhea, bright red blood per rectum, melena, or hematemesis Neurologic: negative for visual changes, syncope, or dizziness All other systems reviewed and are otherwise negative except as noted above.    Blood pressure (!) 146/84, pulse 85, height $RemoveBe'5\' 7"'UGdCcrIPV$  (1.702 m), weight 158 lb 12.8 oz (72 kg).  General appearance: alert and no distress Neck: no adenopathy, no carotid bruit, no JVD, supple, symmetrical, trachea midline and thyroid not enlarged, symmetric, no tenderness/mass/nodules Lungs: clear to auscultation bilaterally Heart: regular rate and rhythm, S1, S2 normal, no murmur, click, rub or gallop Extremities: extremities normal, atraumatic, no cyanosis or edema  EKG sinus rhythm at 85 with nonspecific ST and T-wave changes. I personally reviewed this EKG  ASSESSMENT AND PLAN:   PVD (peripheral vascular disease) History of peripheral vascular disease status post bilateral SFA intervention most recently by myself 10/11/14 with overlapping bye-bye and covered stents to his proximal mid and distal right SFA. He did have intervention on his below-the-knee popliteal artery with 2 vessel runoff. He has history of 60% bilateral iliac disease as well as a patent right renal artery stent and 7080% left renal artery stenosis. He had high-grade proximal mid and distal left SFA stenosis with 2 vessel runoff. When I saw him in July 2016 he was complaining of mild left calf claudication. This has now progressed significantly to lifestyle limiting. His ABI in the left on 11/23/15 was 0.5. We will repeat his lower extremity arterial Doppler studies and plan on repeating angiography and potential intervention on him after the first ability of the year.  Hypertension History of hypertension blood pressure measured 146/84. He is on diltiazem, losartan and hydrochlorothiazide. . Continue current meds at current dosing  HLD  (hyperlipidemia) History of hyperlipidemia on statin therapy followed by his PCP  Bilateral carotid artery disease History of carotid artery disease status post bilateral carotid endarterectomies performed by Dr. Drucie Opitz in 2007. His last carotid Dopplers performed 03/02/13 revealed widely patent endarterectomy sites. He does have bilateral carotid bruits. We will repeat carotid Doppler studies.      Lorretta Harp MD FACP,FACC,FAHA, [facility] 11/07/2016 3:26 PM

## 2016-11-07 NOTE — Patient Instructions (Signed)
Medication Instructions: Your physician recommends that you continue on your current medications as directed. Please refer to the Current Medication list given to you today.  Labwork: Your physician recommends that you return for a FASTING lipid profile and hepatic function panel.   Testing/Procedures: Your physician has requested that you have a lower extremity arterial duplex. During this test, ultrasound is used to evaluate arterial blood flow in the legs. Allow one hour for this exam. There are no restrictions or special instructions.  Your physician has requested that you have an ankle brachial index (ABI). During this test an ultrasound and blood pressure cuff are used to evaluate the arteries that supply the arms and legs with blood. Allow thirty minutes for this exam. There are no restrictions or special instructions.  Your physician has requested that you have a carotid duplex. This test is an ultrasound of the carotid arteries in your neck. It looks at blood flow through these arteries that supply the brain with blood. Allow one hour for this exam. There are no restrictions or special instructions.  Your physician has requested that you have a renal artery duplex. During this test, an ultrasound is used to evaluate blood flow to the kidneys. Allow one hour for this exam. Do not eat after midnight the day before and avoid carbonated beverages. Take your medications as you usually do.   Follow-Up: We will call you with the results of the dopplers and schedule you for a Peripheral angiogram with Dr. Gwenlyn Found in January 2018.  If you need a refill on your cardiac medications before your next appointment, please call your pharmacy.

## 2016-11-23 ENCOUNTER — Other Ambulatory Visit: Payer: Self-pay | Admitting: Cardiovascular Disease

## 2016-11-23 ENCOUNTER — Other Ambulatory Visit: Payer: Self-pay | Admitting: *Deleted

## 2016-11-23 MED ORDER — DILTIAZEM HCL ER 240 MG PO CP24: 240.0000 mg | ORAL_CAPSULE | Freq: Every day | ORAL | 3 refills | Status: DC

## 2016-11-26 ENCOUNTER — Encounter: Payer: Self-pay | Admitting: Cardiovascular Disease

## 2016-12-04 ENCOUNTER — Other Ambulatory Visit: Payer: Self-pay | Admitting: Cardiovascular Disease

## 2016-12-04 DIAGNOSIS — I739 Peripheral vascular disease, unspecified: Secondary | ICD-10-CM

## 2016-12-12 ENCOUNTER — Ambulatory Visit (HOSPITAL_COMMUNITY)
Admission: RE | Admit: 2016-12-12 | Discharge: 2016-12-12 | Disposition: A | Discharge status: Home / Self Care | Payer: Medicare Other | Source: Ambulatory Visit | Attending: Cardiology | Admitting: Cardiology

## 2016-12-12 ENCOUNTER — Ambulatory Visit (INDEPENDENT_AMBULATORY_CARE_PROVIDER_SITE_OTHER): Payer: Medicare Other | Admitting: Cardiovascular Disease

## 2016-12-12 VITALS — BP 183/95 | HR 69 | Ht 68.0 in | Wt 160.8 lb

## 2016-12-12 DIAGNOSIS — I739 Peripheral vascular disease, unspecified: Principal | ICD-10-CM

## 2016-12-12 DIAGNOSIS — R938 Abnormal findings on diagnostic imaging of other specified body structures: Secondary | ICD-10-CM | POA: Insufficient documentation

## 2016-12-12 DIAGNOSIS — Z87891 Personal history of nicotine dependence: Secondary | ICD-10-CM | POA: Insufficient documentation

## 2016-12-12 DIAGNOSIS — I779 Disorder of arteries and arterioles, unspecified: Secondary | ICD-10-CM

## 2016-12-12 DIAGNOSIS — E785 Hyperlipidemia, unspecified: Secondary | ICD-10-CM | POA: Insufficient documentation

## 2016-12-12 DIAGNOSIS — I701 Atherosclerosis of renal artery: Secondary | ICD-10-CM | POA: Diagnosis not present

## 2016-12-12 DIAGNOSIS — R079 Chest pain, unspecified: Secondary | ICD-10-CM

## 2016-12-12 DIAGNOSIS — I700 Atherosclerosis of aorta: Secondary | ICD-10-CM | POA: Diagnosis not present

## 2016-12-12 DIAGNOSIS — I10 Essential (primary) hypertension: Secondary | ICD-10-CM | POA: Insufficient documentation

## 2016-12-12 DIAGNOSIS — I708 Atherosclerosis of other arteries: Secondary | ICD-10-CM | POA: Diagnosis not present

## 2016-12-12 DIAGNOSIS — N281 Cyst of kidney, acquired: Secondary | ICD-10-CM | POA: Insufficient documentation

## 2016-12-12 DIAGNOSIS — I2510 Atherosclerotic heart disease of native coronary artery without angina pectoris: Secondary | ICD-10-CM | POA: Insufficient documentation

## 2016-12-12 DIAGNOSIS — I6523 Occlusion and stenosis of bilateral carotid arteries: Secondary | ICD-10-CM | POA: Diagnosis not present

## 2016-12-12 DIAGNOSIS — I6522 Occlusion and stenosis of left carotid artery: Secondary | ICD-10-CM | POA: Diagnosis not present

## 2016-12-12 NOTE — Patient Instructions (Signed)
Medication Instructions: Your physician recommends that you continue on your current medications as directed. Please refer to the Current Medication list given to you today.  Testing:  Your physician has requested that you have a lexiscan myoview. For further information please visit [email or web address]. Please follow instruction sheet, as given.  Referral:  You have been referred to Harold Barban, MD in Vascular Surgery for consideration of Carotid stenting--schedule appt for 1-2 weeks.  Any Other Instructions will be listed below:  Pharmacologic Stress Electrocardiogram A pharmacologic stress electrocardiogram is a heart (cardiac) test that uses nuclear imaging to evaluate the blood supply to your heart. This test may also be called a pharmacologic stress electrocardiography. Pharmacologic means that a medicine is used to increase your heart rate and blood pressure.  This stress test is done to find areas of poor blood flow to the heart by determining the extent of coronary artery disease (CAD). Some people exercise on a treadmill, which naturally increases the blood flow to the heart. For those people unable to exercise on a treadmill, a medicine is used. This medicine stimulates your heart and will cause your heart to beat harder and more quickly, as if you were exercising.  Pharmacologic stress tests can help determine:  The adequacy of blood flow to your heart during increased levels of activity in order to clear you for discharge home.  The extent of coronary artery blockage caused by CAD.  Your prognosis if you have suffered a heart attack.  The effectiveness of cardiac procedures done, such as an angioplasty, which can increase the circulation in your coronary arteries.  Causes of chest pain or pressure. LET [facility] CARE PROVIDER KNOW ABOUT:  Any allergies you have.  All medicines you are taking, including vitamins, herbs, eye drops, creams, and over-the-counter  medicines.  Previous problems you or members of your family have had with the use of anesthetics.  Any blood disorders you have.  Previous surgeries you have had.  Medical conditions you have.  Possibility of pregnancy, if this applies.  If you are currently breastfeeding. RISKS AND COMPLICATIONS Generally, this is a safe procedure. However, as with any procedure, complications can occur. Possible complications include:  You develop pain or pressure in the following areas:  Chest.  Jaw or neck.  Between your shoulder blades.  Radiating down your left arm.  Headache.  Dizziness or light-headedness.  Shortness of breath.  Increased or irregular heartbeat.  Low blood pressure.  Nausea or vomiting.  Flushing.  Redness going up the arm and slight pain during injection of medicine.  Heart attack (rare). BEFORE THE PROCEDURE   Avoid all forms of caffeine for 24 hours before your test or as directed by your health care provider. This includes coffee, tea (even decaffeinated tea), caffeinated sodas, chocolate, cocoa, and certain pain medicines.  Follow your health care provider's instructions regarding eating and drinking before the test.  Take your medicines as directed at regular times with water unless instructed otherwise. Exceptions may include:  If you have diabetes, ask how you are to take your insulin or pills. It is common to adjust insulin dosing the morning of the test.  If you are taking beta-blocker medicines, it is important to talk to your health care provider about these medicines well before the date of your test. Taking beta-blocker medicines may interfere with the test. In some cases, these medicines need to be changed or stopped 24 hours or more before the test.  If you wear a nitroglycerin  patch, it may need to be removed prior to the test. Ask your health care provider if the patch should be removed before the test.  If you use an inhaler for any  breathing condition, bring it with you to the test.  If you are an outpatient, bring a snack so you can eat right after the stress phase of the test.  Do not smoke for 4 hours prior to the test or as directed by your health care provider.  Do not apply lotions, powders, creams, or oils on your chest prior to the test.  Wear comfortable shoes and clothing. Let your health care provider know if you were unable to complete or follow the preparations for your test. PROCEDURE   Multiple patches (electrodes) will be put on your chest. If needed, small areas of your chest may be shaved to get better contact with the electrodes. Once the electrodes are attached to your body, multiple wires will be attached to the electrodes, and your heart rate will be monitored.  An IV access will be started. A nuclear trace (isotope) is given. The isotope may be given intravenously, or it may be swallowed. Nuclear refers to several types of radioactive isotopes, and the nuclear isotope lights up the arteries so that the nuclear images are clear. The isotope is absorbed by your body. This results in low radiation exposure.  A resting nuclear image is taken to show how your heart functions at rest.  A medicine is given through the IV access.  A second scan is done about 1 hour after the medicine injection and determines how your heart functions under stress.  During this stress phase, you will be connected to an electrocardiogram machine. Your blood pressure and oxygen levels will be monitored. AFTER THE PROCEDURE   Your heart rate and blood pressure will be monitored after the test.  You may return to your normal schedule, including diet,activities, and medicines, unless your health care provider tells you otherwise. This information is not intended to replace advice given to you by your health care provider. Make sure you discuss any questions you have with your health care provider. Document Released:  04/14/2009 Document Revised: 12/01/2013 Document Reviewed: 08/03/2013 Elsevier Interactive Patient Education  2017 [employer].    If you need a refill on your cardiac medications before your next appointment, please call your pharmacy.

## 2016-12-12 NOTE — Progress Notes (Signed)
Mr. Hassell returns for follow-up of his lower extremity arterial Dopplers, carotid Dopplers and renal Dopplers. I saw him 11/07/16. He had trouble complaining of left calf claudication for the last 8-9 months. His Dopplers do show high-grade disease in his distal left SFA. I demonstrated this angiographically at the time of his last angiogram 10/11/14. He had a patent stent in his mid to distal left SFA. High-grade tandem proximal left SFA stenoses and a high-grade left popliteal stenosis with 2 vessel runoff. He would be a candidate for reentry minimal intervention. In addition, he's had bilateral carotid endarterectomies performed by Dr. Amedeo Plenty in 2007. His last carotid Dopplers performed 03/02/13 showed that his endarterectomy sites were widely patent. His most recent carotid Dopplers today however show "trickle flow in his right internal carotid artery suggesting restenosis. He will will probably be a carotid stent candidate. I'm referring him to Dr. Trula Slade for consideration of this with myself.  Lorretta Harp, M.D., [place], [facility], [place] [address] [place], [place]  [address]  [phone] 12/12/2016 11:05 AM

## 2016-12-13 ENCOUNTER — Other Ambulatory Visit: Payer: Self-pay | Admitting: Cardiovascular Disease

## 2016-12-13 ENCOUNTER — Encounter: Payer: Self-pay | Admitting: Cardiovascular Disease

## 2016-12-13 DIAGNOSIS — I701 Atherosclerosis of renal artery: Secondary | ICD-10-CM

## 2016-12-13 LAB — LIPID PANEL
Cholesterol: 331 mg/dL — ABNORMAL HIGH (ref <200)
HDL: 35 mg/dL — ABNORMAL LOW (ref >40)
Total CHOL/HDL Ratio: 9.5 Ratio — ABNORMAL HIGH (ref <5.0)
Triglycerides: 474 mg/dL — ABNORMAL HIGH (ref <150)

## 2016-12-13 LAB — HEPATIC FUNCTION PANEL
ALT: 11 U/L (ref 9–46)
AST: 17 U/L (ref 10–35)
Albumin: 4.1 g/dL (ref 3.6–5.1)
Alkaline Phosphatase: 213 U/L — ABNORMAL HIGH (ref 40–115)
BILIRUBIN DIRECT: 0.1 mg/dL (ref <=0.2)
BILIRUBIN TOTAL: 0.4 mg/dL (ref 0.2–1.2)
Indirect Bilirubin: 0.3 mg/dL (ref 0.2–1.2)
Total Protein: 6.9 g/dL (ref 6.1–8.1)

## 2016-12-13 LAB — LDL CHOLESTEROL, DIRECT: LDL DIRECT: 208 mg/dL — AB (ref <130)

## 2016-12-14 ENCOUNTER — Ambulatory Visit (HOSPITAL_COMMUNITY)
Admission: RE | Admit: 2016-12-14 | Discharge: 2016-12-14 | Disposition: A | Discharge status: Home / Self Care | Payer: Medicare Other | Source: Ambulatory Visit | Attending: Internal Medicine | Admitting: Internal Medicine

## 2016-12-14 DIAGNOSIS — R079 Chest pain, unspecified: Secondary | ICD-10-CM | POA: Diagnosis not present

## 2016-12-14 LAB — MYOCARDIAL PERFUSION IMAGING
CHL CUP NUCLEAR SRS: 1
CHL CUP NUCLEAR SSS: 3
CSEPPHR: 84 {beats}/min
LV dias vol: 102 mL (ref 62–150)
LV sys vol: 53 mL
Rest HR: 60 {beats}/min
SDS: 2
TID: 1.17

## 2016-12-14 MED ORDER — TECHNETIUM TC 99M TETROFOSMIN IV KIT
10.2000 | PACK | Freq: Once | INTRAVENOUS | Status: AC | PRN
  Administered 2016-12-14: 10.2 via INTRAVENOUS
  Filled 2016-12-14: qty 11

## 2016-12-14 MED ORDER — TECHNETIUM TC 99M TETROFOSMIN IV KIT
30.1000 | PACK | Freq: Once | INTRAVENOUS | Status: AC | PRN
  Administered 2016-12-14: 30.1 via INTRAVENOUS
  Filled 2016-12-14: qty 31

## 2016-12-14 MED ORDER — REGADENOSON 0.4 MG/5ML IV SOLN
0.4000 mg | Freq: Once | INTRAVENOUS | Status: AC
  Administered 2016-12-14: 0.4 mg via INTRAVENOUS

## 2016-12-20 ENCOUNTER — Encounter: Payer: Self-pay | Admitting: Surgery

## 2016-12-24 ENCOUNTER — Encounter: Payer: Self-pay | Admitting: Surgery

## 2016-12-24 ENCOUNTER — Other Ambulatory Visit: Payer: Self-pay

## 2016-12-24 ENCOUNTER — Ambulatory Visit (INDEPENDENT_AMBULATORY_CARE_PROVIDER_SITE_OTHER): Payer: Medicare Other | Admitting: Surgery

## 2016-12-24 VITALS — BP 159/87 | HR 62 | Temp 97.9°F | Resp 20 | Ht 68.0 in | Wt 159.0 lb

## 2016-12-24 DIAGNOSIS — I6521 Occlusion and stenosis of right carotid artery: Secondary | ICD-10-CM | POA: Diagnosis not present

## 2016-12-24 NOTE — Progress Notes (Signed)
[facility]ist of [place]  Patient name: Joshua Wyatt MRN: [id number] DOB: July 12, 1946 Sex: male   REFERRING PROVIDER:    Dr. Gwenlyn Found   REASON FOR CONSULT:    Carotid stenosis  HISTORY OF PRESENT ILLNESS:   Joshua Wyatt is a 71 y.o. male, who is For today for evaluation of carotid occlusive disease.  He is status post bilateral carotid endarterectomy by Dr. Amedeo Plenty in 2007.  His most recent Doppler study revealed trickle flow in his right internal carotid artery.  He remains asymptomatic.  Specifically he denies numbness or weakness in either extremity.  He denies slurred speech.  He denies amaurosis fugax.   He has a history of peripheral vascular disease.  He has undergone percutaneous intervention in the past with Dr. Gwenlyn Found.  He has recurrent symptoms and is awaiting carotid repair in order to address his legs.  The patient is a former smoker.  He takes a statin for hypercholesterolemia.  Past Medical History:  Diagnosis Date  . Bilateral calf pain 03-02-2013   lower ext.doppler-mild arterial insufficiency to lower ext. this is a disease in val  . Cerebral atherosclerosis 03-02-2013   carotid duplex- normal study  . GERD (gastroesophageal reflux disease)   . History of echocardiogram 08-02-2005   normal study   . Hyperlipemia   . Hypertension 03-30-2013   PV Angiogram- rt. renal artery stent was widely open,50-60% lt. renal artery stenosis,lt. SFA stents patent with high grade above the knee  poplital stenosis  . Kidney stones   . Normal cardiac stress test 03-25-2013   EF 60%, normal stress test ,this is a presurgical  test  . Peripheral vascular disease ([place])   . PVD (peripheral vascular disease) ([place]) 10/25/08   5 french tennis racket catheter was used for midstream and distal abd. aortography with bifemeral runoff using digital subtraction bolus chase step-table technique. PTA was performed with 4x4 cordis powerflex,stenting  with a 6x6 bard nitinol self expanding stent.reduction of distal SFA stenosis from 80 to 0 % with a 5x4 bard rival balloon   . Renal artery stenosis ([place]) 03-02-2013   renal artery doppler-this was an abnormal doppler     FAMILY HISTORY   No family history on file.  SOCIAL HISTORY:   Social History   Social History  . Marital status: Married    Spouse name: N/A  . Number of children: N/A  . Years of education: N/A   Occupational History  . Not on file.   Social History Main Topics  . Smoking status: Former Smoker    Years: 58.00    Types: Cigars, Cigarettes    Quit date: 10/18/2013  . Smokeless tobacco: Never Used  . Alcohol use No  . Drug use: No  . Sexual activity: Yes     Comment: RARE  CIGAR   Other Topics Concern  . Not on file   Social History Narrative  . No narrative on file    Allergies  Allergen Reactions  . Statins Other (See Comments)    unspecified    Current Outpatient Prescriptions  Medication Sig Dispense Refill  . clopidogrel (PLAVIX) 75 MG tablet Take 1 tablet (75 mg total) by mouth daily. 30 tablet 5  . diltiazem (DILACOR XR) 240 MG 24 hr capsule Take 1 capsule (240 mg total) by mouth daily. 90 capsule 3  . losartan-hydrochlorothiazide (HYZAAR) 100-12.5 MG tablet TAKE ONE TABLET BY MOUTH ONCE DAILY 90 tablet 2  . lovastatin (MEVACOR) 20 MG tablet  Take 1 tablet (20 mg total) by mouth at bedtime. 90 tablet 3  . omeprazole (PRILOSEC OTC) 20 MG tablet Take 20 mg by mouth daily.     No current facility-administered medications for this visit.     REVIEW OF SYSTEMS:   '[X]'$  denotes positive finding, $RemoveBeforeDEI'[ ]'[id number]$  denotes negative finding Cardiac  Comments:  Chest pain or chest pressure:    Shortness of breath upon exertion:    Short of breath when lying flat:    Irregular heart rhythm:        Vascular    Pain in calf, thigh, or hip brought on by ambulation: x   Pain in feet at night that wakes you up from your sleep:     Blood clot in your veins:     Leg swelling:         Pulmonary    Oxygen at home:    Productive cough:     Wheezing:         Neurologic    Sudden weakness in arms or legs:     Sudden numbness in arms or legs:     Sudden onset of difficulty speaking or slurred speech:    Temporary loss of vision in one eye:     Problems with dizziness:         Gastrointestinal    Blood in stool:      Vomited blood:         Genitourinary    Burning when urinating:     Blood in urine:        Psychiatric    Major depression:         Hematologic    Bleeding problems:    Problems with blood clotting too easily:        Skin    Rashes or ulcers:        Constitutional    Fever or chills:     PHYSICAL EXAM:   There were no vitals filed for this visit.  GENERAL: The patient is a well-nourished male, in no acute distress. The vital signs are documented above. CARDIAC: There is a regular rate and rhythm.  VASCULAR: Bilateral carotid bruit PULMONARY: Nonlabored respirations MUSCULOSKELETAL: There are no major deformities or cyanosis. NEUROLOGIC: No focal weakness or paresthesias are detected. SKIN: There are no ulcers or rashes noted. PSYCHIATRIC: The patient has a normal affect.  STUDIES:   I have reviewed his outside carotid duplex.  This shows trickle flow within the proximal right internal carotid artery suggesting high-grade stenosis despite the numeric velocity measurements.  ASSESSMENT and PLAN   Asymptomatic high-grade recurrent right carotid stenosis: This most likely represents recurrent atherosclerotic disease.  I feel that most appropriate next step is to proceed with angiography and if he is a candidate for stenting, proceed with right carotid stenting with distal embolic protection.  We discussed the risks and benefits of the procedure including the risk of stroke and access site complications.  The procedure will be scheduled for myself and Dr. Gwenlyn Found at a mutually convenient date.  The patient will  continue aspirin and Plavix on a daily basis   Annamarie Major, MD [facility] [phone] Pager [phone]

## 2016-12-31 ENCOUNTER — Encounter (HOSPITAL_COMMUNITY): Payer: Self-pay

## 2016-12-31 ENCOUNTER — Ambulatory Visit (HOSPITAL_COMMUNITY): Admit: 2016-12-31 | Payer: Medicare Other | Admitting: Cardiovascular Disease

## 2016-12-31 SURGERY — LOWER EXTREMITY ANGIOGRAPHY

## 2017-01-03 ENCOUNTER — Observation Stay (HOSPITAL_COMMUNITY)
Admission: RE | Admit: 2017-01-03 | Discharge: 2017-01-04 | Disposition: A | Discharge status: Home / Self Care | Payer: Medicare Other | Source: Ambulatory Visit | Attending: Cardiovascular Disease | Admitting: Cardiovascular Disease

## 2017-01-03 ENCOUNTER — Encounter (HOSPITAL_COMMUNITY)
Admission: RE | Disposition: A | Discharge status: Home / Self Care | Payer: Self-pay | Source: Ambulatory Visit | Attending: Cardiovascular Disease

## 2017-01-03 DIAGNOSIS — Z87891 Personal history of nicotine dependence: Secondary | ICD-10-CM | POA: Insufficient documentation

## 2017-01-03 DIAGNOSIS — E785 Hyperlipidemia, unspecified: Secondary | ICD-10-CM | POA: Diagnosis not present

## 2017-01-03 DIAGNOSIS — Z7902 Long term (current) use of antithrombotics/antiplatelets: Secondary | ICD-10-CM | POA: Diagnosis not present

## 2017-01-03 DIAGNOSIS — E782 Mixed hyperlipidemia: Secondary | ICD-10-CM | POA: Diagnosis present

## 2017-01-03 DIAGNOSIS — I10 Essential (primary) hypertension: Secondary | ICD-10-CM | POA: Insufficient documentation

## 2017-01-03 DIAGNOSIS — I739 Peripheral vascular disease, unspecified: Secondary | ICD-10-CM | POA: Diagnosis present

## 2017-01-03 DIAGNOSIS — I6521 Occlusion and stenosis of right carotid artery: Principal | ICD-10-CM | POA: Insufficient documentation

## 2017-01-03 DIAGNOSIS — E876 Hypokalemia: Secondary | ICD-10-CM | POA: Diagnosis not present

## 2017-01-03 DIAGNOSIS — I779 Disorder of arteries and arterioles, unspecified: Secondary | ICD-10-CM | POA: Diagnosis present

## 2017-01-03 DIAGNOSIS — T502X5A Adverse effect of carbonic-anhydrase inhibitors, benzothiadiazides and other diuretics, initial encounter: Secondary | ICD-10-CM | POA: Insufficient documentation

## 2017-01-03 DIAGNOSIS — Z7982 Long term (current) use of aspirin: Secondary | ICD-10-CM | POA: Diagnosis not present

## 2017-01-03 HISTORY — PX: PERIPHERAL VASCULAR CATHETERIZATION: SHX172C

## 2017-01-03 LAB — POCT I-STAT, CHEM 8
BUN: 16 mg/dL (ref 6–20)
CREATININE: 1.2 mg/dL (ref 0.61–1.24)
Calcium, Ion: 1.28 mmol/L (ref 1.15–1.40)
Chloride: 104 mmol/L (ref 101–111)
Glucose, Bld: 110 mg/dL — ABNORMAL HIGH (ref 65–99)
HEMATOCRIT: 35 % — AB (ref 39.0–52.0)
HEMOGLOBIN: 11.9 g/dL — AB (ref 13.0–17.0)
Potassium: 2.9 mmol/L — ABNORMAL LOW (ref 3.5–5.1)
SODIUM: 142 mmol/L (ref 135–145)
TCO2: 27 mmol/L (ref 0–100)

## 2017-01-03 LAB — MRSA PCR SCREENING: MRSA BY PCR: NEGATIVE

## 2017-01-03 LAB — POCT ACTIVATED CLOTTING TIME: ACTIVATED CLOTTING TIME: 296 s

## 2017-01-03 SURGERY — CAROTID PTA/STENT INTERVENTION

## 2017-01-03 MED ORDER — CLOPIDOGREL BISULFATE 75 MG PO TABS: 75.0000 mg | ORAL_TABLET | Freq: Every day | ORAL | Status: DC

## 2017-01-03 MED ORDER — HYDRALAZINE HCL 20 MG/ML IJ SOLN
10.0000 mg | INTRAMUSCULAR | Status: DC | PRN
  Administered 2017-01-03 (×2): 10 mg via INTRAVENOUS

## 2017-01-03 MED ORDER — LIDOCAINE HCL (PF) 1 % IJ SOLN
INTRAMUSCULAR | Status: AC
  Filled 2017-01-03: qty 30

## 2017-01-03 MED ORDER — SODIUM CHLORIDE 0.9 % IV SOLN
INTRAVENOUS | Status: DC
  Administered 2017-01-03: 06:00:00 via INTRAVENOUS

## 2017-01-03 MED ORDER — ATROPINE SULFATE 1 MG/10ML IJ SOSY
PREFILLED_SYRINGE | INTRAMUSCULAR | Status: AC
  Filled 2017-01-03: qty 10

## 2017-01-03 MED ORDER — POTASSIUM CHLORIDE CRYS ER 20 MEQ PO TBCR
EXTENDED_RELEASE_TABLET | ORAL | Status: AC
  Filled 2017-01-03: qty 1

## 2017-01-03 MED ORDER — ACETAMINOPHEN 325 MG PO TABS: 650.0000 mg | ORAL_TABLET | ORAL | Status: DC | PRN

## 2017-01-03 MED ORDER — HEPARIN (PORCINE) IN NACL 2-0.9 UNIT/ML-% IJ SOLN
INTRAMUSCULAR | Status: AC
  Filled 2017-01-03: qty 1000

## 2017-01-03 MED ORDER — ASPIRIN EC 81 MG PO TBEC
81.0000 mg | DELAYED_RELEASE_TABLET | Freq: Every day | ORAL | Status: DC
  Administered 2017-01-04: 81 mg via ORAL
  Filled 2017-01-03: qty 1

## 2017-01-03 MED ORDER — IRBESARTAN 150 MG PO TABS
150.0000 mg | ORAL_TABLET | Freq: Every day | ORAL | Status: DC
  Administered 2017-01-04: 150 mg via ORAL
  Filled 2017-01-03: qty 1

## 2017-01-03 MED ORDER — IODIXANOL 320 MG/ML IV SOLN
INTRAVENOUS | Status: DC | PRN
  Administered 2017-01-03: 165 mL via INTRA_ARTERIAL

## 2017-01-03 MED ORDER — ASPIRIN 81 MG PO CHEW
81.0000 mg | CHEWABLE_TABLET | Freq: Once | ORAL | Status: AC
  Administered 2017-01-03: 81 mg via ORAL

## 2017-01-03 MED ORDER — MORPHINE SULFATE (PF) 4 MG/ML IV SOLN: 2.0000 mg | INTRAVENOUS | Status: DC | PRN

## 2017-01-03 MED ORDER — POTASSIUM CHLORIDE CRYS ER 20 MEQ PO TBCR
20.0000 meq | EXTENDED_RELEASE_TABLET | Freq: Once | ORAL | Status: AC
  Administered 2017-01-03: 20 meq via ORAL

## 2017-01-03 MED ORDER — PANTOPRAZOLE SODIUM 20 MG PO TBEC
40.0000 mg | DELAYED_RELEASE_TABLET | Freq: Every day | ORAL | Status: DC | PRN
Start: 2017-01-03 — End: 2017-01-04

## 2017-01-03 MED ORDER — DILTIAZEM HCL ER COATED BEADS 240 MG PO CP24
240.0000 mg | ORAL_CAPSULE | Freq: Every day | ORAL | Status: DC
  Administered 2017-01-04: 240 mg via ORAL
  Filled 2017-01-03 (×2): qty 1

## 2017-01-03 MED ORDER — ASPIRIN 81 MG PO TABS: 81.0000 mg | ORAL_TABLET | Freq: Every day | ORAL | Status: DC

## 2017-01-03 MED ORDER — ASPIRIN 81 MG PO CHEW
CHEWABLE_TABLET | ORAL | Status: AC
  Administered 2017-01-03: 81 mg via ORAL
  Filled 2017-01-03: qty 1

## 2017-01-03 MED ORDER — BIVALIRUDIN BOLUS VIA INFUSION - CUPID
INTRAVENOUS | Status: DC | PRN
Start: 2017-01-03 — End: 2017-01-03
  Administered 2017-01-03: 52.425 mg via INTRAVENOUS

## 2017-01-03 MED ORDER — ONDANSETRON HCL 4 MG/2ML IJ SOLN
INTRAMUSCULAR | Status: AC
  Filled 2017-01-03: qty 2

## 2017-01-03 MED ORDER — CLOPIDOGREL BISULFATE 75 MG PO TABS
ORAL_TABLET | ORAL | Status: AC
  Administered 2017-01-03: 75 mg via ORAL
  Filled 2017-01-03: qty 1

## 2017-01-03 MED ORDER — HEPARIN (PORCINE) IN NACL 2-0.9 UNIT/ML-% IJ SOLN
INTRAMUSCULAR | Status: DC | PRN
  Administered 2017-01-03: 1000 mL via INTRA_ARTERIAL

## 2017-01-03 MED ORDER — LIDOCAINE HCL (PF) 1 % IJ SOLN
INTRAMUSCULAR | Status: DC | PRN
  Administered 2017-01-03: 30 mL via SUBCUTANEOUS

## 2017-01-03 MED ORDER — CLOPIDOGREL BISULFATE 75 MG PO TABS
75.0000 mg | ORAL_TABLET | Freq: Every day | ORAL | Status: DC
  Administered 2017-01-04: 75 mg via ORAL
  Filled 2017-01-03: qty 1

## 2017-01-03 MED ORDER — SODIUM CHLORIDE 0.9 % IV SOLN
INTRAVENOUS | Status: DC | PRN
  Administered 2017-01-03: 1.75 mg/kg/h via INTRAVENOUS

## 2017-01-03 MED ORDER — CLOPIDOGREL BISULFATE 75 MG PO TABS
75.0000 mg | ORAL_TABLET | Freq: Once | ORAL | Status: AC
  Administered 2017-01-03: 75 mg via ORAL

## 2017-01-03 MED ORDER — NOREPINEPHRINE BITARTRATE 1 MG/ML IV SOLN
INTRAVENOUS | Status: AC
  Filled 2017-01-03: qty 4

## 2017-01-03 MED ORDER — ONDANSETRON HCL 4 MG/2ML IJ SOLN
4.0000 mg | Freq: Four times a day (QID) | INTRAMUSCULAR | Status: DC | PRN
  Administered 2017-01-03: 4 mg via INTRAVENOUS

## 2017-01-03 MED ORDER — HYDRALAZINE HCL 20 MG/ML IJ SOLN
INTRAMUSCULAR | Status: AC
  Filled 2017-01-03: qty 1

## 2017-01-03 MED ORDER — BIVALIRUDIN 250 MG IV SOLR
INTRAVENOUS | Status: AC
  Filled 2017-01-03: qty 250

## 2017-01-03 MED ORDER — SODIUM CHLORIDE 0.9 % IV SOLN: INTRAVENOUS | Status: AC

## 2017-01-03 SURGICAL SUPPLY — 21 items
BALLN MOZEC 3.0X20 (BALLOONS) ×2
BALLN VIATRAC 5X20X135 (BALLOONS) ×2
BALLOON MOZEC 3.0X20 (BALLOONS) IMPLANT
BALLOON VIATRAC 5X20X135 (BALLOONS) IMPLANT
CATH ANGIO 5F JB1 100CM (CATHETERS) ×1 IMPLANT
CATH ANGIO 5F PIGTAIL 100CM (CATHETERS) ×1 IMPLANT
DEVICE EMBOSHIELD NAV6 4.0-7.0 (WIRE) ×1 IMPLANT
ELECT DEFIB PAD ADLT CADENCE (PAD) ×1 IMPLANT
KIT ENCORE 26 ADVANTAGE (KITS) ×1 IMPLANT
KIT PV (KITS) ×2 IMPLANT
SHEATH PINNACLE 6F 10CM (SHEATH) ×1 IMPLANT
SHEATH SHUTTLE SELECT 6F (SHEATH) ×1 IMPLANT
STENT XACT CAR 9-7X40X136 (Permanent Stent) ×1 IMPLANT
STOPCOCK MORSE 400PSI 3WAY (MISCELLANEOUS) ×1 IMPLANT
SYR MEDRAD MARK V 150ML (SYRINGE) ×2 IMPLANT
SYRINGE MEDRAD AVANTA MACH 7 (SYRINGE) ×1 IMPLANT
TRANSDUCER W/STOPCOCK (MISCELLANEOUS) ×2 IMPLANT
TRAY PV CATH (CUSTOM PROCEDURE TRAY) ×2 IMPLANT
TUBING CIL FLEX 10 FLL-RA (TUBING) ×1 IMPLANT
WIRE AMPLATZ SSTIFF .035X260CM (WIRE) ×1 IMPLANT
WIRE HITORQ VERSACORE ST 145CM (WIRE) ×1 IMPLANT

## 2017-01-03 NOTE — Interval H&P Note (Signed)
History and Physical Interval Note:  01/03/2017 7:39 AM  Joshua Wyatt  has presented today for surgery, with the diagnosis of right carotid stenosis  The various methods of treatment have been discussed with the patient and family. After consideration of risks, benefits and other options for treatment, the patient has consented to  Procedure(s): Carotid PTA/Stent Intervention / Right (N/A) as a surgical intervention .  The patient's history has been reviewed, patient examined, no change in status, stable for surgery.  I have reviewed the patient's chart and labs.  Questions were answered to the patient's satisfaction.     [name]

## 2017-01-03 NOTE — Progress Notes (Signed)
RFA site dressing clean, dry, intact. Pt. Resting with eyes closed. No s/s of respiratory distress.

## 2017-01-03 NOTE — Progress Notes (Addendum)
Site area: right groin Site Prior to Removal:  Level 0 Pressure Applied For:  20 minutes Manual:   yes Patient Status During Pull:  Treated for nausea. Post Pull Site:  Level  0 Post Pull Instructions Given:  yes Post Pull Pulses Present: yes Dressing Applied:  yes Bedrest begins @  1140 Comments:

## 2017-01-03 NOTE — H&P (View-Only) (Signed)
Joshua Wyatt returns for follow-up of his lower extremity arterial Dopplers, carotid Dopplers and renal Dopplers. I saw him 11/07/16. He had trouble complaining of left calf claudication for the last 8-9 months. His Dopplers do show high-grade disease in his distal left SFA. I demonstrated this angiographically at the time of his last angiogram 10/11/14. He had a patent stent in his mid to distal left SFA. High-grade tandem proximal left SFA stenoses and a high-grade left popliteal stenosis with 2 vessel runoff. He would be a candidate for reentry minimal intervention. In addition, he's had bilateral carotid endarterectomies performed by Dr. Amedeo Plenty in 2007. His last carotid Dopplers performed 03/02/13 showed that his endarterectomy sites were widely patent. His most recent carotid Dopplers today however show "trickle flow in his right internal carotid artery suggesting restenosis. He will will probably be a carotid stent candidate. I'm referring him to Dr. Trula Slade for consideration of this with myself.  Lorretta Harp, M.D., [place], [facility], [place] [address] [place], [place]  [address]  [phone] 12/12/2016 11:05 AM

## 2017-01-04 ENCOUNTER — Encounter (HOSPITAL_COMMUNITY): Payer: Self-pay | Admitting: Cardiovascular Disease

## 2017-01-04 ENCOUNTER — Other Ambulatory Visit: Payer: Self-pay | Admitting: Physician Assistant

## 2017-01-04 ENCOUNTER — Telehealth: Payer: Self-pay | Admitting: Cardiovascular Disease

## 2017-01-04 DIAGNOSIS — I779 Disorder of arteries and arterioles, unspecified: Secondary | ICD-10-CM | POA: Diagnosis not present

## 2017-01-04 DIAGNOSIS — E876 Hypokalemia: Secondary | ICD-10-CM

## 2017-01-04 DIAGNOSIS — I6521 Occlusion and stenosis of right carotid artery: Secondary | ICD-10-CM | POA: Diagnosis not present

## 2017-01-04 LAB — BASIC METABOLIC PANEL
ANION GAP: 10 (ref 5–15)
BUN: 11 mg/dL (ref 6–20)
CHLORIDE: 106 mmol/L (ref 101–111)
CO2: 25 mmol/L (ref 22–32)
Calcium: 9.4 mg/dL (ref 8.9–10.3)
Creatinine, Ser: 0.95 mg/dL (ref 0.61–1.24)
GFR calc Af Amer: >60 mL/min (ref >60)
GFR calc non Af Amer: >60 mL/min (ref >60)
GLUCOSE: 102 mg/dL — AB (ref 65–99)
POTASSIUM: 2.8 mmol/L — AB (ref 3.5–5.1)
Sodium: 141 mmol/L (ref 135–145)

## 2017-01-04 LAB — CBC
HEMATOCRIT: 32.6 % — AB (ref 39.0–52.0)
HEMOGLOBIN: 10.5 g/dL — AB (ref 13.0–17.0)
MCH: 28 pg (ref 26.0–34.0)
MCHC: 32.2 g/dL (ref 30.0–36.0)
MCV: 86.9 fL (ref 78.0–100.0)
Platelets: 264 10*3/uL (ref 150–400)
RBC: 3.75 MIL/uL — ABNORMAL LOW (ref 4.22–5.81)
RDW: 13.4 % (ref 11.5–15.5)
WBC: 8.5 10*3/uL (ref 4.0–10.5)

## 2017-01-04 MED ORDER — POTASSIUM CHLORIDE CRYS ER 20 MEQ PO TBCR
40.0000 meq | EXTENDED_RELEASE_TABLET | Freq: Once | ORAL | Status: AC
Start: 2017-01-04 — End: 2017-01-04
  Administered 2017-01-04: 40 meq via ORAL
  Filled 2017-01-04: qty 2

## 2017-01-04 MED ORDER — IRBESARTAN 300 MG PO TABS: 300.0000 mg | ORAL_TABLET | Freq: Every day | ORAL | 3 refills | Status: DC

## 2017-01-04 MED ORDER — IRBESARTAN 150 MG PO TABS: 300.0000 mg | ORAL_TABLET | Freq: Every day | ORAL | Status: DC

## 2017-01-04 MED ORDER — POTASSIUM CHLORIDE CRYS ER 20 MEQ PO TBCR: 20.0000 meq | EXTENDED_RELEASE_TABLET | Freq: Every day | ORAL | Status: DC

## 2017-01-04 MED ORDER — POTASSIUM CHLORIDE CRYS ER 20 MEQ PO TBCR: 20.0000 meq | EXTENDED_RELEASE_TABLET | Freq: Every day | ORAL | 3 refills | Status: DC

## 2017-01-04 MED FILL — Norepinephrine Bitartrate IV Soln 1 MG/ML (Base Equivalent): INTRAVENOUS | Qty: 4 | Status: AC

## 2017-01-04 MED FILL — Potassium Chloride Microencapsulated Crys ER Tab 20 mEq: ORAL | Qty: 1 | Status: AC

## 2017-01-04 NOTE — Telephone Encounter (Signed)
°  New Prob  Pt has a question regarding his medication list. Please call.

## 2017-01-04 NOTE — Telephone Encounter (Signed)
Pt needed clarification on instruction given at discharge. Aware he was recommended to discontinue one of his meds contributing to low potassium, but could not remember which one - ("out of it" after procedure). Advised him to discontinue the losartan-HCTZ per provider instructions.   Pt voiced understanding. Aware to call again if clarification needed or further recommendations.

## 2017-01-04 NOTE — Progress Notes (Signed)
Discharged home accompanied by family, discharge instructions given to pt. Belongings with pt.

## 2017-01-04 NOTE — Progress Notes (Signed)
Progress Note  Patient Name: Joshua Wyatt Date of Encounter: 01/04/2017  Primary Cardiologist: [name] MD  Subjective   Feels very well. Wants to go home. States he can't wait until he is able to get the circulation in his leg fixed.  Inpatient Medications    Scheduled Meds: . aspirin EC  81 mg Oral Daily  . clopidogrel  75 mg Oral Daily  . diltiazem  240 mg Oral Daily  . [START ON 01/05/2017] irbesartan  300 mg Oral Daily  . potassium chloride  40 mEq Oral Once   Continuous Infusions:  PRN Meds: acetaminophen, hydrALAZINE, morphine injection, ondansetron (ZOFRAN) IV, pantoprazole   Vital Signs    Vitals:   01/04/17 0400 01/04/17 0500 01/04/17 0600 01/04/17 0911  BP: (!) 150/78 (!) 155/80 (!) 148/66 (!) 165/81  Pulse: (!) 58 81 67 73  Resp: $Remo'15 16 15 13  'YUnDQ$ Temp:    99.2 F (37.3 C)  TempSrc:    Oral  SpO2: 95% 97% 95% 97%  Weight:      Height:        Intake/Output Summary (Last 24 hours) at 01/04/17 0945 Last data filed at 01/04/17 0500  Gross per 24 hour  Intake          1356.92 ml  Output              200 ml  Net          1156.92 ml   Filed Weights   01/03/17 0534  Weight: 154 lb (69.9 kg)    Telemetry   NSR with rare PVC - Personally Reviewed  ECG    N/A - Personally Reviewed  Physical Exam   GEN: No acute distress.  Neck: No JVD, bilateral carotid bruits. Cardiac: RRR, no murmurs, rubs, or gallops.  Respiratory: Clear to auscultation bilaterally. GI: Soft, nontender, non-distended  MS: No edema; No deformity. Neuro:  AAOx3. Psych: Normal affect  Labs    Chemistry Recent Labs Lab 01/03/17 0623 01/04/17 0206  NA 142 141  K 2.9* 2.8*  CL 104 106  CO2  --  25  GLUCOSE 110* 102*  BUN 16 11  CREATININE 1.20 0.95  CALCIUM  --  9.4  GFRNONAA  --  >60  GFRAA  --  >60  ANIONGAP  --  10     Hematology Recent Labs Lab 01/03/17 0623 01/04/17 0206  WBC  --  8.5  RBC  --  3.75*  HGB 11.9* 10.5*  HCT 35.0* 32.6*  MCV  --   86.9  MCH  --  28.0  MCHC  --  32.2  RDW  --  13.4  PLT  --  264    Cardiac EnzymesNo results for input(s): TROPONINI in the last 168 hours. No results for input(s): TROPIPOC in the last 168 hours.   BNPNo results for input(s): BNP, PROBNP in the last 168 hours.   DDimer No results for input(s): DDIMER in the last 168 hours.   Radiology    No results found.  Cardiac Studies   N/A  Patient Profile     71 y.o. male with severe PAD and prior bilateral CEAs presents with critical RICA stenosis.  Assessment & Plan    1. Carotid arterial disease s/p bilateral CEA. Recurrent critical stenosis in right ICA. S/p stenting yesterday. Neurologically intact today. No complaints. Continue ASA/Plavix. 2. HTN- poorly controlled. Will change losartan HCT to Avapro 300 mg daily. Continue Cardizem CD.  3. Hypokalemia. Secondary  to HCTZ. ( was on losartan HCT ) Will replete- give 40 meq x 2 today then 20 meq daily. Repeat BMET at office visit. Stop HCTZ 4. PAD with claudication. Plan for angiography/intervention per Dr. Gwenlyn Found. 5. Hyperlipidemia. On statin  Patient is stable for DC home today.   Signed, Peter Martinique, MD  01/04/2017, 9:45 AM

## 2017-01-04 NOTE — Discharge Summary (Signed)
Discharge Summary    Patient ID: Joshua Wyatt,  MRN: [id number], DOB/AGE: Oct 04, 1946 71 y.o.  Admit date: 01/03/2017 Discharge date: 01/04/2017  Primary Care Provider: [facility] Primary Cardiologist: Dr. Gwenlyn Found   Discharge Diagnoses    Principal Problem:   Bilateral carotid artery disease [facility]) Active Problems:   PVD (peripheral vascular disease) ([place])   HLD (hyperlipidemia)   Essential hypertension   Hypokalemia   Allergies Allergies  Allergen Reactions  . Statins Other (See Comments)    unspecified     History of Present Illness     71 y.o. male with severe PAD, carotid artery disease s/p bilateral CEAs, HTN, HLD and former tobacco abuse who presented to [facility] on 01/03/17 for planned peripheral angiography and carotid stenting for critical RICA stenosis.  He is status post bilateral carotid endarterectomy by Dr. Amedeo Plenty in 2007.  His most recent Doppler study revealed trickle flow in his right internal carotid artery.  He remained asymptomatic. He was seen by Dr. Trula Slade for evaluation of repair. He was felt too high risk for surgery and referred back to Dr. Gwenlyn Found for carotid stenting. This was scheduled for 01/03/17. Of note, nuclear stress test on 12/14/16 was low risk for ischemia.   Hospital Course     Consultants: none  Carotid artery disease with critical RICA stenosis: S/p stenting yesterday. Neurologically intact today. No complaints. Continue ASA/Plavix.  HTN: poorly controlled. Losartan HCT discontinued and continued on Mycardis $RemoveBef'80mg'rPvkmaxOMZ$  daily. Continue Cardizem CD.   Hypokalemia: felt to be secondary to HCTZ. (He was on losartan HCT per Dr. Kennon Holter last office note, but only Mycardis listed on home meds here.) Discussed with Dr. Martinique who thinks Mycardis $RemoveBeforeD'80mg'svMPifmcIJWgko$  daily is okay. K repleted in hospital and started on Kdur 20 MEq daily. He should get a repeat BMET in ~ 1 week (order placed and he can go to [place] office to have it checked).  PAD with  significant claudication: plan for angiography/intervention per Dr. Gwenlyn Found.  HLD: continue statin  The patient has had an uncomplicated hospital course and is recovering well. Th femoral catheter site is stable He has been seen by Dr. Martinique today and deemed ready for discharge home. All follow-up appointments have been scheduled. Discharge medications are listed below.  _____________  Discharge Vitals Blood pressure (!) 165/81, pulse 73, temperature 99.2 F (37.3 C), temperature source Oral, resp. rate 13, height $RemoveBe'5\' 7"'GSqYXCReL$  (1.702 m), weight 154 lb (69.9 kg), SpO2 97 %.  Filed Weights   01/03/17 0534  Weight: 154 lb (69.9 kg)    Labs & Radiologic Studies     CBC  Recent Labs  01/03/17 0623 01/04/17 0206  WBC  --  8.5  HGB 11.9* 10.5*  HCT 35.0* 32.6*  MCV  --  86.9  PLT  --  409   Basic Metabolic Panel  Recent Labs  01/03/17 0623 01/04/17 0206  NA 142 141  K 2.9* 2.8*  CL 104 106  CO2  --  25  GLUCOSE 110* 102*  BUN 16 11  CREATININE 1.20 0.95  CALCIUM  --  9.4     Diagnostic Studies/Procedures     01/03/17  PV Angiogram/Intervention   History obtained from chart review. The patient is a 71 year old thin appearing married Caucasian male with no children who I last saw in the office 06/17/15. He has a history of PVOD status post bilateral carotid endarterectomies performed by Dr. Drucie Opitz in 2007. We have been following him by duplex ultrasound  which performed most recently several days ago revealed this to be widely patent. He has also had bilateral SFA intervention as well as right renal artery stenting. His other problems include hypertension and hyperlipidemia. He did have an episode of chest heaviness recently, but more importantly he has complained of progressive lifestyle limiting claudication since I last saw him. Most recent lower extremity Dopplers reveal a right ABI of 0.93 and a left of 0.82. He does appear to have moderate iliac disease as well as  high grade right SFA and popliteal stenosis. He underwent abdominal aortography with bifemoral runoff on 03/30/13 revealed a widely patent right renal artery stent, 50-60% proximal left renal artery stenosis. The left SFA was while he stayed patent and he had a 95% focal above-the-knee popliteal artery stenosis with three-vessel runoff. There was a 60% focal lesion in the right common iliac artery and a 50% segmental proximal to mid right SFA stenosis with a patent stent in the mid to distal right SFA. He also had a 50% right popliteal stenosis with three-vessel runoff. He underwent cutting balloon angioplasty of the left above-the-knee popliteal artery and Angiosculpt balloon with an excellent angiographic result and was discharged the following day. I performed angiography on him 10/11/14 and recanalized a subtotally occluded right SFA with overlapping via Bahn covered stent performed intervention on his above-the-knee popliteal artery on that side. He had a patent mid to distal left SFA stent with high grade segmental and 6 sequential disease in the proximal SFA and distal SFA on the left. Since I saw him a year and a half ago he's had progressive lifestyle limiting claudication in his left calf. He had carotid Dopplers performed that showed a trickle flow in the right internal carotid artery that represents progression of disease. A Myoview stress test was low risk. I referred him to Dr. Trula Slade who felt similarly that redo endarterectomy was high risk and therefore the patient presents today for elective carotid artery stenting using distal protection.  Procedures Performed:            1. Arch aortogram            2. Selective right and left carotid angiogram (intra-and extracranial views)               Angiographic Data:   1: Aortic arch angiogram-type 1-2 arch 2: Right carotid artery-there was a 20-30% right innominate lesion. There was a complex ulcerated 95-99% right internal carotid artery  lesion at the distal end of the patch. 3: Left carotid artery-the left carotid endarterectomy site was widely patent  IMPRESSION: Mr. Kutzer has high-grade right ICA stenosis representing progression of disease compared to his last Doppler study several years ago. He is asymptomatic. He is high risk for endarterectomy. We'll proceed with elective right carotid artery stenting.  _____________    Disposition   Pt is being discharged home today in good condition.  Follow-up Plans & Appointments    Follow-up Information    [name], MD Follow up on 01/23/2017.   Specialties:  Cardiology, Radiology Why:  @ 2:45 pm.  Contact information: [address] [place] [address] [place] Follow up on 01/11/2017.   Specialty:  Cardiology Why:  Go to lab to get blood drawn anytime between 8am-4pm to check your potassium  Contact information: [place] [phone]  Discharge Medications     Medication List    TAKE these medications   aspirin 81 MG tablet Take 81 mg by mouth daily.   clopidogrel 75 MG tablet Commonly known as:  PLAVIX Take 1 tablet (75 mg total) by mouth daily.   diltiazem 240 MG 24 hr capsule Commonly known as:  DILACOR XR Take 1 capsule (240 mg total) by mouth daily.   omeprazole 20 MG tablet Commonly known as:  PRILOSEC OTC Take 20 mg by mouth daily as needed (heartburn).   potassium chloride SA 20 MEQ tablet Commonly known as:  K-DUR,KLOR-CON Take 1 tablet (20 mEq total) by mouth daily.   telmisartan 80 MG tablet Commonly known as:  MICARDIS Take 80 mg by mouth daily.       Outstanding Labs/Studies   BMET in 1 week  Duration of Discharge Encounter   Greater than 30 minutes including physician time.  Signed, Angelena Form PA-C 01/04/2017, 10:45 AM

## 2017-01-04 NOTE — Care Management Obs Status (Signed)
[facility] NOTIFICATION   Patient Details  Name: Joshua Wyatt MRN: [id number] Date of Birth: 1946-11-02   Medicare Observation Status Notification Given:  Yes    Lacretia Leigh, RN 01/04/2017, 10:20 AM

## 2017-01-04 NOTE — Op Note (Addendum)
    Patient name: Joshua Wyatt MRN: [id number] DOB: June 07, 1946 Sex: male  01/03/2017 Pre-operative Diagnosis: recurrent right  carotid stenosis Post-operative diagnosis:  Same Surgeon:  Annamarie Major  Co-surgeon:  Adora Fridge Procedure Performed:  1.  Right carotid stent with distal embolic protection    Indications:  Patient has a history of carotid endarterectomy.  He has developed a high-grade recurrence.  Procedure:  Please see Dr. Kennon Holter note for the diagnostic portion of the procedure.  Once this was completed and the decision was made to proceed with intervention, a 6 French 90 cm sheath was inserted.  Angiomax bolus and continuous infusion were initiated.  ACT were used to confirm appropriate anti-coagulation.  Next, using a JB 1 catheter, the right internal carotid artery was selected.  Amplatz superstiff wire was inserted.  The 6 French sheath was advanced into the distal common carotid artery.  Contrast injections were performed to confirm we with appropriate level.  Next a large NAV-6 filter was prepared and inserted.  It was advanced easily across the lesion and deployed in the distal internal carotid artery.  Next a 3 x 20 balloon was used to dilate the lesion.  There is no significant hemodynamic response.  A  XACT 9 x 7 x 40 was inserted and deployed into the distal internal carotid artery landing in the distal common carotid artery.  This was then molded to confirmation with a 5 mm balloon.  Completion imaging showed complete resolution of the stenosis with the stent in excellent position.  There was no debris within the filter.  The filter was then retrieved.  The long sheath was exchanged out for a short 6 [place] sheath.  There were no immediate complications.  The patient remained neurologically intact.      Impression:  #1  Successful right carotid stenting with distal embolic protection for a 90% stenosis using a XACT 9 x 7 x 40 stent with a device with postprocedure stenosis  less than 5%    V. Annamarie Major, M.D. [facility] Office: [phone] Pager:  [phone]

## 2017-01-04 NOTE — Progress Notes (Signed)
[facility]  Subjective  - Doing well no complaints ready to go home.   Objective (!) 165/81 73 99.2 F (37.3 C) (Oral) 13 97%  Intake/Output Summary (Last 24 hours) at 01/04/17 1053 Last data filed at 01/04/17 1000  Gross per 24 hour  Intake          1506.92 ml  Output              200 ml  Net          1306.92 ml    Palpable radial pulses, grip 5/5 B Right groin soft without hematoma  Assessment/Planning: POD # 1 right carotid stent   F/U with Dr. Gwenlyn Found.  We will be available if needed in the future.  Laurence Slate [facility] 01/04/2017 10:53 AM --  Laboratory Lab Results:  Recent Labs  01/03/17 0623 01/04/17 0206  WBC  --  8.5  HGB 11.9* 10.5*  HCT 35.0* 32.6*  PLT  --  264   BMET  Recent Labs  01/03/17 0623 01/04/17 0206  NA 142 141  K 2.9* 2.8*  CL 104 106  CO2  --  25  GLUCOSE 110* 102*  BUN 16 11  CREATININE 1.20 0.95  CALCIUM  --  9.4    COAG Lab Results  Component Value Date   INR 0.97 10/05/2014   No results found for: PTT

## 2017-01-08 ENCOUNTER — Telehealth: Payer: Self-pay | Admitting: Cardiovascular Disease

## 2017-01-08 NOTE — Telephone Encounter (Signed)
New Message  Pt c/o medication issue:  1. Name of Medication: Losartan..... Potassium..... Biltxr..... telmisartan  2. How are you currently taking this medication (dosage and times per day)?   3. Are you having a reaction (difficulty breathing--STAT)? no  4. What is your medication issue? Pt call requesting to speak with RN about these medication. Pt states he is not sure which medications he need to be taking. Please call back to discuss

## 2017-01-08 NOTE — Telephone Encounter (Signed)
Spoke with pt, questions regarding medications answered.

## 2017-01-11 ENCOUNTER — Other Ambulatory Visit: Payer: Self-pay | Admitting: *Deleted

## 2017-01-11 DIAGNOSIS — E876 Hypokalemia: Secondary | ICD-10-CM

## 2017-01-11 LAB — BASIC METABOLIC PANEL
BUN: 14 mg/dL (ref 7–25)
CO2: 27 mmol/L (ref 20–31)
Calcium: 10.3 mg/dL (ref 8.6–10.3)
Chloride: 103 mmol/L (ref 98–110)
Creat: 1.13 mg/dL (ref 0.70–1.18)
GLUCOSE: 92 mg/dL (ref 65–99)
Potassium: 5 mmol/L (ref 3.5–5.3)
SODIUM: 139 mmol/L (ref 135–146)

## 2017-01-23 ENCOUNTER — Ambulatory Visit (INDEPENDENT_AMBULATORY_CARE_PROVIDER_SITE_OTHER): Payer: Medicare Other | Admitting: Cardiovascular Disease

## 2017-01-23 ENCOUNTER — Encounter: Payer: Self-pay | Admitting: Cardiovascular Disease

## 2017-01-23 VITALS — BP 136/80 | HR 83 | Ht 67.5 in | Wt 158.4 lb

## 2017-01-23 DIAGNOSIS — I779 Disorder of arteries and arterioles, unspecified: Secondary | ICD-10-CM

## 2017-01-23 DIAGNOSIS — I10 Essential (primary) hypertension: Secondary | ICD-10-CM

## 2017-01-23 DIAGNOSIS — Z01812 Encounter for preprocedural laboratory examination: Secondary | ICD-10-CM

## 2017-01-23 DIAGNOSIS — I739 Peripheral vascular disease, unspecified: Secondary | ICD-10-CM | POA: Diagnosis not present

## 2017-01-23 LAB — CBC WITH DIFFERENTIAL/PLATELET
BASOS ABS: 0 {cells}/uL (ref 0–200)
Basophils Relative: 0 %
Eosinophils Absolute: 261 cells/uL (ref 15–500)
Eosinophils Relative: 3 %
HEMATOCRIT: 35.5 % — AB (ref 38.5–50.0)
HEMOGLOBIN: 11.7 g/dL — AB (ref 13.2–17.1)
LYMPHS ABS: 2001 {cells}/uL (ref 850–3900)
Lymphocytes Relative: 23 %
MCH: 28.5 pg (ref 27.0–33.0)
MCHC: 33 g/dL (ref 32.0–36.0)
MCV: 86.4 fL (ref 80.0–100.0)
MONO ABS: 783 {cells}/uL (ref 200–950)
MPV: 12.5 fL (ref 7.5–12.5)
Monocytes Relative: 9 %
NEUTROS ABS: 5655 {cells}/uL (ref 1500–7800)
NEUTROS PCT: 65 %
Platelets: 320 10*3/uL (ref 140–400)
RBC: 4.11 MIL/uL — AB (ref 4.20–5.80)
RDW: 14.2 % (ref 11.0–15.0)
WBC: 8.7 10*3/uL (ref 3.8–10.8)

## 2017-01-23 LAB — TSH: TSH: 0.8 m[IU]/L (ref 0.40–4.50)

## 2017-01-23 NOTE — Progress Notes (Signed)
01/23/2017 Joshua Wyatt   [birth year]  [id number]  Primary Physician Thressa Sheller, MD Primary Cardiologist: Lorretta Harp MD Renae Gloss  HPI:  The patient is a 71 year old thin appearing married Caucasian male with no children who I last saw in the office 12/12/16.[name] He has a history of PVOD status post bilateral carotid endarterectomies performed by Dr. Drucie Opitz in 2007. We have been following him by duplex ultrasound which performed most recently several days ago revealed this to be widely patent. He has also had bilateral SFA intervention as well as right renal artery stenting. His other problems include hypertension and hyperlipidemia. He did have an episode of chest heaviness recently, but more importantly he has complained of progressive lifestyle limiting claudication since I last saw him. Most recent lower extremity Dopplers reveal a right ABI of 0.93 and a left of 0.82. He does appear to have moderate iliac disease as well as high grade right SFA and popliteal stenosis. He underwent abdominal aortography with bifemoral runoff on 03/30/13 revealed a widely patent right renal artery stent, 50-60% proximal left renal artery stenosis. The left SFA was while he stayed patent and he had a 95% focal above-the-knee popliteal artery stenosis with three-vessel runoff. There was a 60% focal lesion in the right common iliac artery and a 50% segmental proximal to mid right SFA stenosis with a patent stent in the mid to distal right SFA. He also had a 50% right popliteal stenosis with three-vessel runoff. He underwent cutting balloon angioplasty of the left above-the-knee popliteal artery and Angiosculpt balloon with an excellent angiographic result and was discharged the following day. I performed angiography on him 10/11/14 and recanalized a subtotally occluded right SFA with overlapping via Bahn covered stent performed intervention on his above-the-knee popliteal artery on that side. He  had a patent mid to distal left SFA stent with high grade segmental and 6 sequential disease in the proximal SFA and distal SFA on the left. Since I saw him a year and a half ago he's had progressive lifestyle limiting claudication in his left calf. Addition, he's had severe progression of disease in his right internal carotid artery. I performed carotid stenting on him 01/03/17 with an excellent result.   Current Outpatient Prescriptions  Medication Sig Dispense Refill  . aspirin 81 MG tablet Take 81 mg by mouth daily.    . clopidogrel (PLAVIX) 75 MG tablet Take 1 tablet (75 mg total) by mouth daily. 30 tablet 5  . diltiazem (DILACOR XR) 240 MG 24 hr capsule Take 1 capsule (240 mg total) by mouth daily. 90 capsule 3  . omeprazole (PRILOSEC OTC) 20 MG tablet Take 20 mg by mouth daily as needed (heartburn).     . potassium chloride SA (K-DUR,KLOR-CON) 20 MEQ tablet Take 1 tablet (20 mEq total) by mouth daily. 30 tablet 3  . telmisartan (MICARDIS) 80 MG tablet Take 80 mg by mouth daily.      No current facility-administered medications for this visit.     Allergies  Allergen Reactions  . Statins Other (See Comments)    unspecified    Social History   Social History  . Marital status: Married    Spouse name: N/A  . Number of children: N/A  . Years of education: N/A   Occupational History  . Not on file.   Social History Main Topics  . Smoking status: Former Smoker    Years: 58.00    Types: Cigars, Cigarettes  Quit date: 10/18/2013  . Smokeless tobacco: Never Used  . Alcohol use No  . Drug use: No  . Sexual activity: Yes     Comment: RARE  CIGAR   Other Topics Concern  . Not on file   Social History Narrative  . No narrative on file     Review of Systems: General: negative for chills, fever, night sweats or weight changes.  Cardiovascular: negative for chest pain, dyspnea on exertion, edema, orthopnea, palpitations, paroxysmal nocturnal dyspnea or shortness of  breath Dermatological: negative for rash Respiratory: negative for cough or wheezing Urologic: negative for hematuria Abdominal: negative for nausea, vomiting, diarrhea, bright red blood per rectum, melena, or hematemesis Neurologic: negative for visual changes, syncope, or dizziness All other systems reviewed and are otherwise negative except as noted above.    Blood pressure 136/80, pulse 83, height 5' 7.5" (1.715 m), weight 158 lb 6.4 oz (71.8 kg), SpO2 98 %.  General appearance: alert and no distress Neck: no adenopathy, no carotid bruit, no JVD, supple, symmetrical, trachea midline and thyroid not enlarged, symmetric, no tenderness/mass/nodules Lungs: clear to auscultation bilaterally Heart: regular rate and rhythm, S1, S2 normal, no murmur, click, rub or gallop Extremities: extremities normal, atraumatic, no cyanosis or edema  EKG sinus rhythm 83 without ST or T-wave changes. I personally reviewed this EKG  ASSESSMENT AND PLAN:   Bilateral carotid artery disease [facility]) Mr. Hockett has had bilateral carotid endarterectomies performed by Dr. Amedeo Plenty in 2007. Dopplers showed progression of disease on the right. He underwent carotid stenting by myself and Dr. Trula Slade on 01/03/17 with an excellent result. His right common femoral puncture site is well-healed. We will recheck carotid Doppler studies. He is on dual antiplatelet therapy.  Claudication of left lower extremity ([place]) History of severe bilateral lower extremity peripheral vascular occlusive disease status post multiple interventions in the past most recently 10/11/14. He had 3 Viabahn  stents placed in his right SFA. His right ABI is normal. His left ABI has declined. He does have a diffuse 60% left common iliac artery stenosis as well as tandem 8090% mid left SFA stenosis, 90% left popliteal stenosis with 2 vessel runoff. He has severe lifestyle limiting claudication. Plan on performing angiography and potential percutaneous  intervention in the next several weeks.      Lorretta Harp MD FACP,FACC,FAHA, [facility] 01/23/2017 2:44 PM

## 2017-01-23 NOTE — Assessment & Plan Note (Signed)
Mr. Flenner has had bilateral carotid endarterectomies performed by Dr. Amedeo Plenty in 2007. Dopplers showed progression of disease on the right. He underwent carotid stenting by myself and Dr. Trula Slade on 01/03/17 with an excellent result. His right common femoral puncture site is well-healed. We will recheck carotid Doppler studies. He is on dual antiplatelet therapy.

## 2017-01-23 NOTE — Patient Instructions (Signed)
Dr. Gwenlyn Found has ordered a peripheral angiogram on 02/04/17 with Dr. Zachery Dauer: Claudication) to be done at [facility].  This procedure is going to look at the bloodflow in your lower extremities.  If Dr. Gwenlyn Found is able to open up the arteries, you will have to spend one night in the hospital.  If he is not able to open the arteries, you will be able to go home that same day.    After the procedure, you will not be allowed to drive for 3 days or push, pull, or lift anything greater than 10 lbs for one week.    You will be required to have the following tests prior to the procedure:  1. Blood work-the blood work can be done no more than 14 days prior to the procedure.  It can be done at any [facility] lab.  There is one downstairs on the first floor of this building and one in the [facility] building [phone] [address])  2. Chest Xray-the chest xray order has already been placed at the [facility].     *REPS--Scott  Puncture site Rt. Groin   Your physician has requested that you have a carotid duplex. This test is an ultrasound of the carotid arteries in your neck. It looks at blood flow through these arteries that supply the brain with blood. Allow one hour for this exam. There are no restrictions or special instructions.

## 2017-01-23 NOTE — Assessment & Plan Note (Signed)
History of severe bilateral lower extremity peripheral vascular occlusive disease status post multiple interventions in the past most recently 10/11/14. He had 3 Viabahn  stents placed in his right SFA. His right ABI is normal. His left ABI has declined. He does have a diffuse 60% left common iliac artery stenosis as well as tandem 8090% mid left SFA stenosis, 90% left popliteal stenosis with 2 vessel runoff. He has severe lifestyle limiting claudication. Plan on performing angiography and potential percutaneous intervention in the next several weeks.

## 2017-01-24 ENCOUNTER — Other Ambulatory Visit: Payer: Self-pay | Admitting: Cardiovascular Disease

## 2017-01-24 ENCOUNTER — Ambulatory Visit
Admission: RE | Admit: 2017-01-24 | Discharge: 2017-01-24 | Disposition: A | Discharge status: Home / Self Care | Payer: Medicare Other | Source: Ambulatory Visit | Attending: Cardiovascular Disease | Admitting: Cardiovascular Disease

## 2017-01-24 DIAGNOSIS — I10 Essential (primary) hypertension: Secondary | ICD-10-CM

## 2017-01-24 DIAGNOSIS — I739 Peripheral vascular disease, unspecified: Secondary | ICD-10-CM

## 2017-01-24 DIAGNOSIS — J9811 Atelectasis: Secondary | ICD-10-CM | POA: Diagnosis not present

## 2017-01-24 DIAGNOSIS — R7989 Other specified abnormal findings of blood chemistry: Secondary | ICD-10-CM

## 2017-01-24 DIAGNOSIS — I779 Disorder of arteries and arterioles, unspecified: Secondary | ICD-10-CM

## 2017-01-24 LAB — BASIC METABOLIC PANEL WITH GFR
BUN: 20 mg/dL (ref 7–25)
CHLORIDE: 100 mmol/L (ref 98–110)
CO2: 22 mmol/L (ref 20–31)
Calcium: 10.9 mg/dL — ABNORMAL HIGH (ref 8.6–10.3)
Creat: 1.65 mg/dL — ABNORMAL HIGH (ref 0.70–1.18)
GFR, EST NON AFRICAN AMERICAN: 41 mL/min — AB (ref >=60)
GFR, Est African American: 47 mL/min — ABNORMAL LOW (ref >=60)
GLUCOSE: 77 mg/dL (ref 65–99)
POTASSIUM: 5.2 mmol/L (ref 3.5–5.3)
Sodium: 136 mmol/L (ref 135–146)

## 2017-01-24 LAB — APTT: aPTT: 27 s (ref 22–34)

## 2017-01-24 LAB — PROTIME-INR
INR: 1
PROTHROMBIN TIME: 10.6 s (ref 9.0–11.5)

## 2017-01-24 IMAGING — DX DG CHEST 2V
2 series · 2 of 2 positions shown · non-contrast
Comparison: [DATE].

CLINICAL DATA: Claudication.

EXAM:
CHEST  2 VIEW

[dg chest 2 view (1 of 2)]
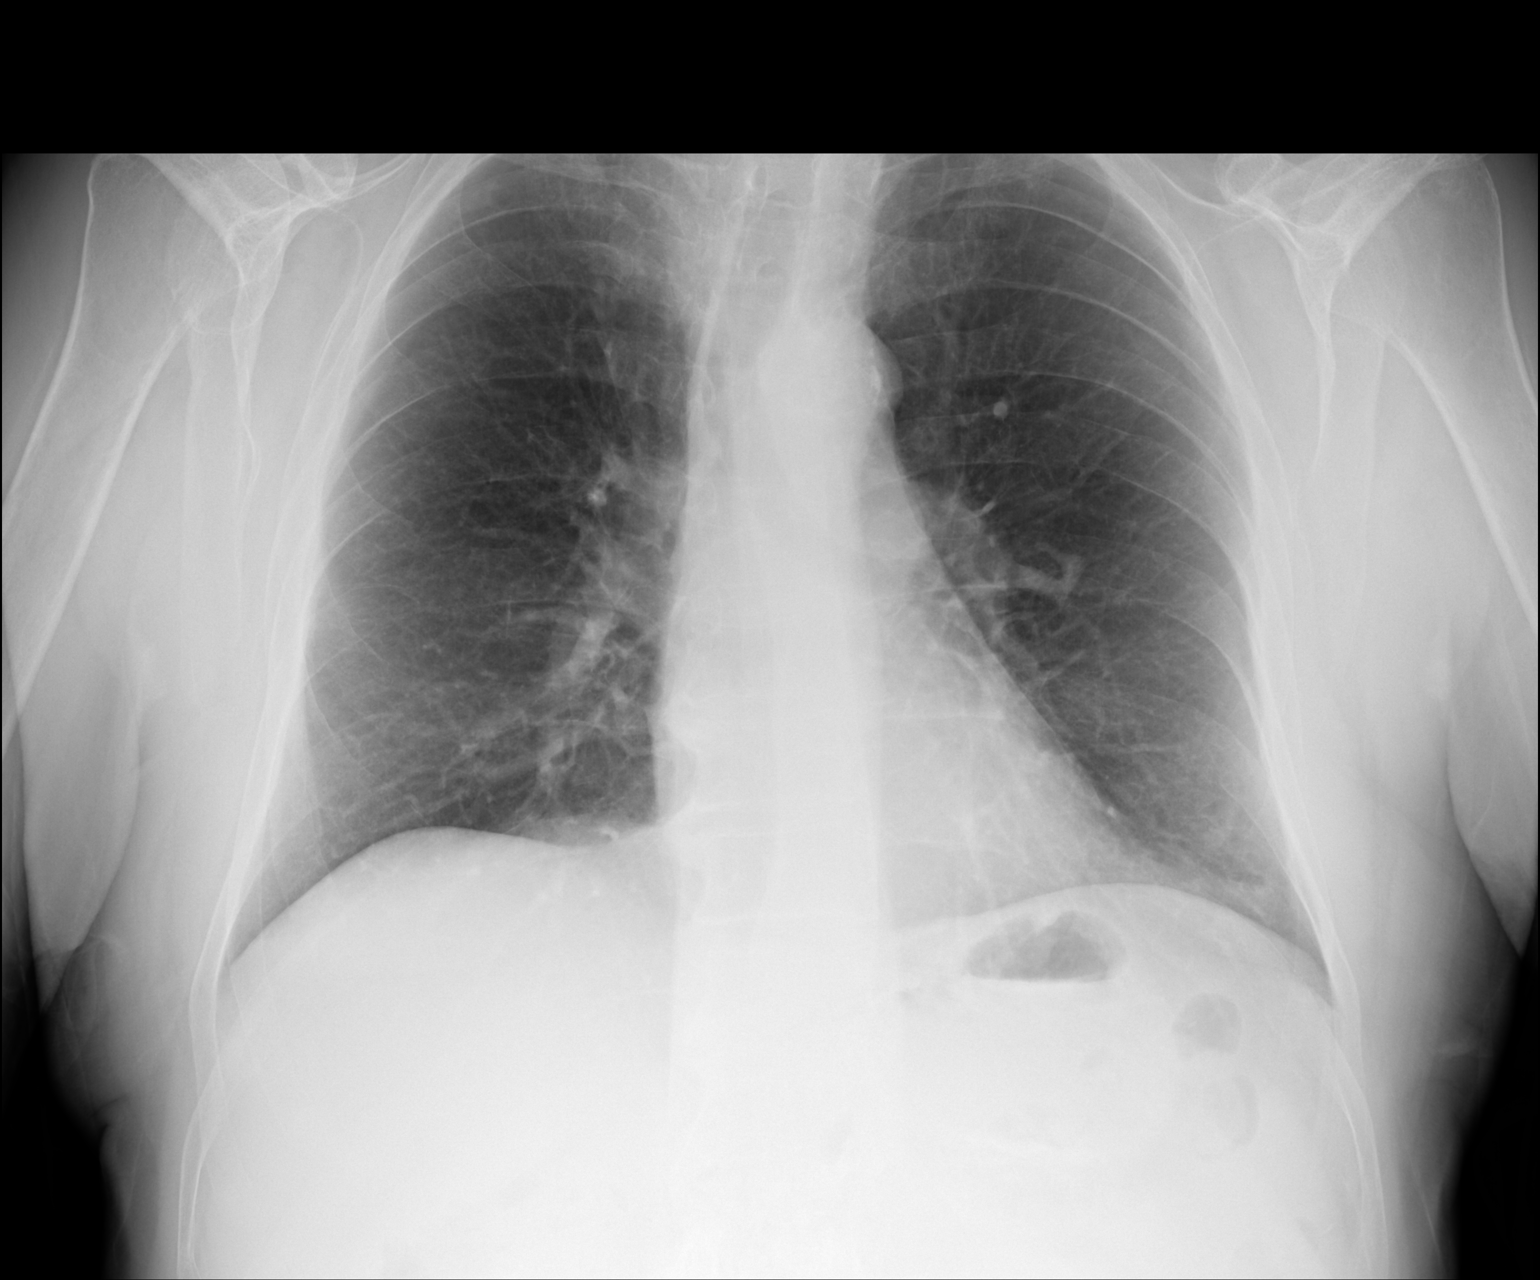

[dg chest 2 view (2 of 2)]
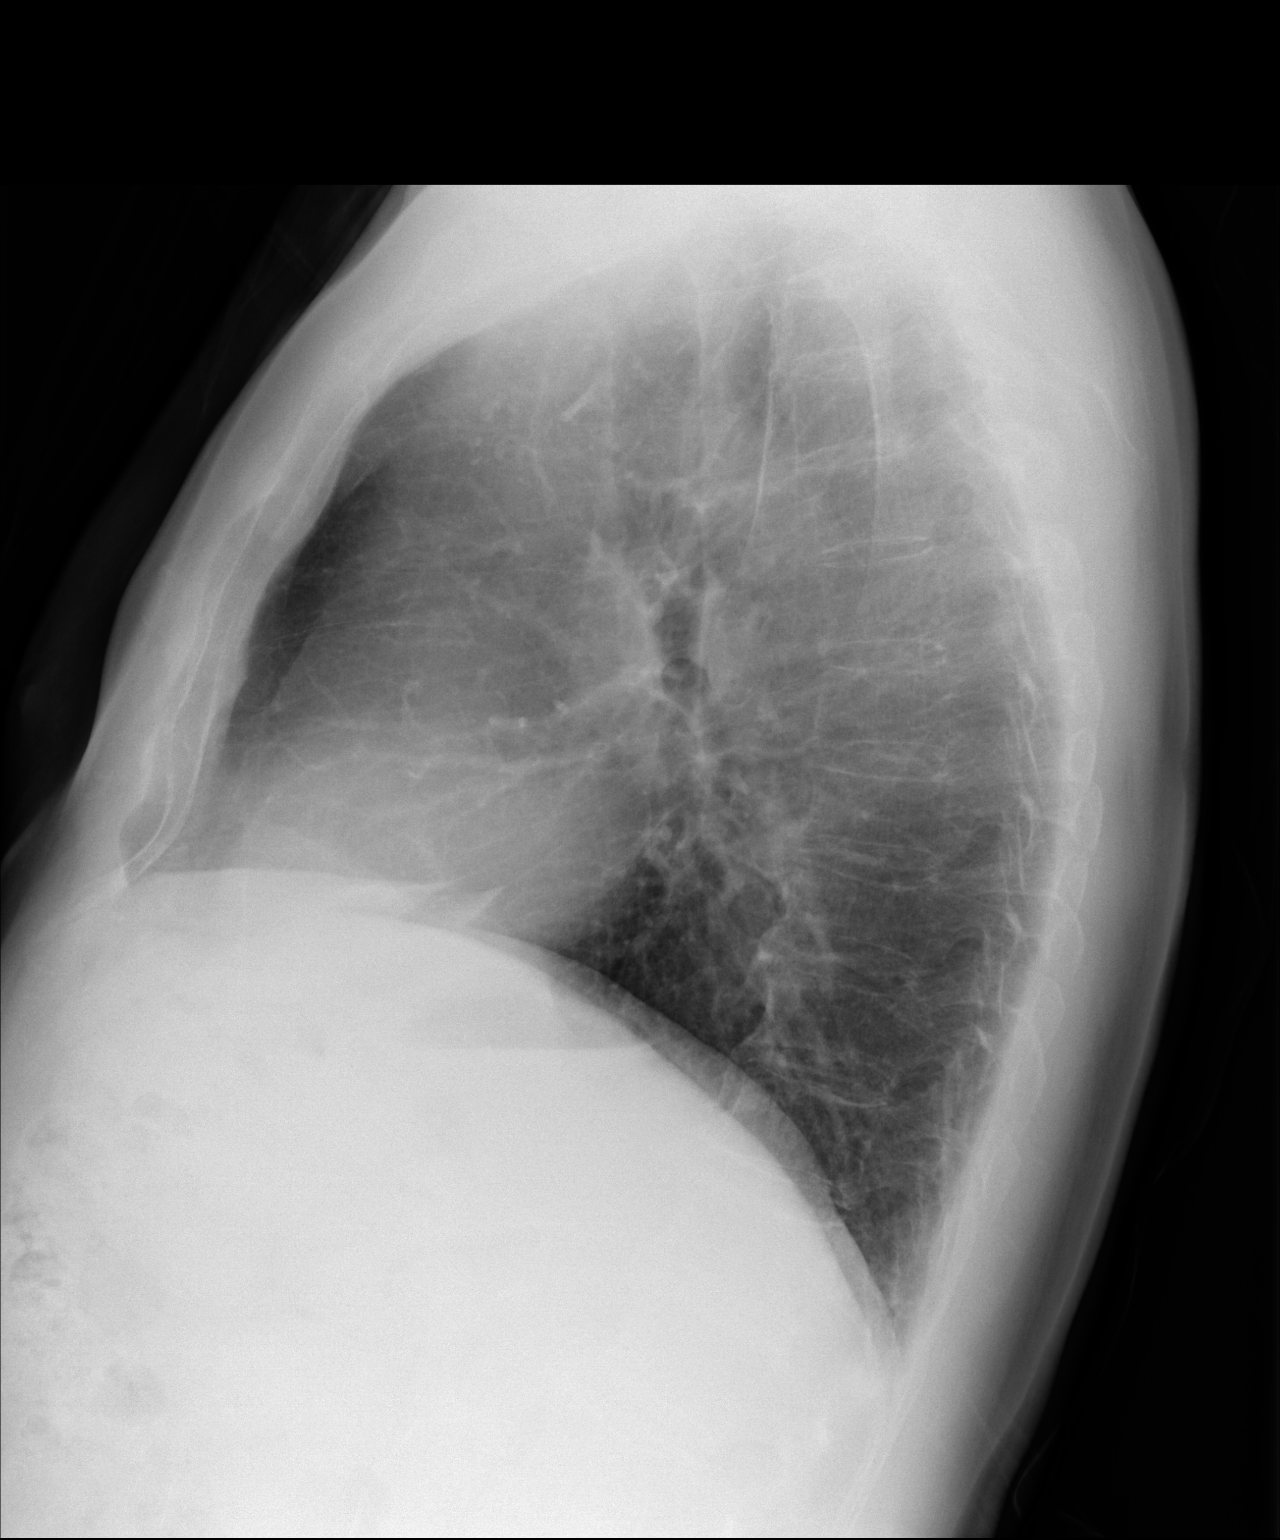

[2 of 2 positions shown; findings below may reference images not displayed]

FINDINGS: Mediastinum hilar structures normal. Mild left base subsegmental
atelectasis. No pleural effusion or pneumothorax. Heart size normal.
Degenerative changes thoracic spine .
IMPRESSION: Mild left base subsegmental atelectasis and/or scarring.

## 2017-01-28 ENCOUNTER — Telehealth: Payer: Self-pay | Admitting: Cardiovascular Disease

## 2017-01-28 ENCOUNTER — Other Ambulatory Visit: Payer: Self-pay | Admitting: *Deleted

## 2017-01-28 DIAGNOSIS — R7989 Other specified abnormal findings of blood chemistry: Secondary | ICD-10-CM

## 2017-01-28 LAB — BASIC METABOLIC PANEL WITH GFR
BUN: 29 mg/dL — ABNORMAL HIGH (ref 7–25)
CALCIUM: 10.5 mg/dL — AB (ref 8.6–10.3)
CO2: 23 mmol/L (ref 20–31)
CREATININE: 1.68 mg/dL — AB (ref 0.70–1.18)
Chloride: 100 mmol/L (ref 98–110)
GFR, EST AFRICAN AMERICAN: 46 mL/min — AB (ref >=60)
GFR, EST NON AFRICAN AMERICAN: 40 mL/min — AB (ref >=60)
GLUCOSE: 96 mg/dL (ref 65–99)
Potassium: 6 mmol/L — ABNORMAL HIGH (ref 3.5–5.3)
SODIUM: 131 mmol/L — AB (ref 135–146)

## 2017-01-28 NOTE — Telephone Encounter (Signed)
Spoke w patient. He voiced that he hasn't gotten lab slips in mail for repeat testing. Reviewed chart. Indication is for BMET at [facility] and I've released this order. Clarified that he does not need lab slips to get blood drawn today. Pt will go today to have drawn, aware we'll follow up w further advice once labs resulted. Voiced thanks for call.

## 2017-01-28 NOTE — Telephone Encounter (Signed)
New message    Pt calling to ask about blood work before procedure.

## 2017-01-29 ENCOUNTER — Telehealth: Payer: Self-pay | Admitting: Cardiovascular Disease

## 2017-01-29 ENCOUNTER — Other Ambulatory Visit: Payer: Self-pay | Admitting: Cardiovascular Disease

## 2017-01-29 DIAGNOSIS — E7800 Pure hypercholesterolemia, unspecified: Secondary | ICD-10-CM

## 2017-01-29 DIAGNOSIS — I739 Peripheral vascular disease, unspecified: Secondary | ICD-10-CM

## 2017-01-29 DIAGNOSIS — I10 Essential (primary) hypertension: Secondary | ICD-10-CM

## 2017-01-29 NOTE — Telephone Encounter (Signed)
BASIC METABOLIC PANEL WITH GFR  Order: [id number]  Status:  Final result Visible to patient:  No (Not Released) Dx:  Elevated serum creatinine  Notes Recorded by Lorretta Harp, MD on 01/29/2017 at 2:09 PM EST Hold Micardis today, liberalize IV by mouth fluid intake, stat basic metabolic panel first thing Friday morning, determine whether or not he needs to be admitted for hydration on Sunday based on those results.     Left message on vm--ok per DPR--with results. Asked him to call back if he has any questions.  Wil attempt to reach pt again later today if he has not returned call.

## 2017-01-30 ENCOUNTER — Other Ambulatory Visit: Payer: Self-pay | Admitting: Cardiovascular Disease

## 2017-01-30 DIAGNOSIS — I739 Peripheral vascular disease, unspecified: Secondary | ICD-10-CM

## 2017-02-01 ENCOUNTER — Other Ambulatory Visit: Payer: Self-pay | Admitting: *Deleted

## 2017-02-01 ENCOUNTER — Telehealth: Payer: Self-pay | Admitting: Cardiovascular Disease

## 2017-02-01 ENCOUNTER — Telehealth: Payer: Self-pay | Admitting: *Deleted

## 2017-02-01 DIAGNOSIS — Z01818 Encounter for other preprocedural examination: Secondary | ICD-10-CM

## 2017-02-01 DIAGNOSIS — Z79899 Other long term (current) drug therapy: Secondary | ICD-10-CM

## 2017-02-01 LAB — BASIC METABOLIC PANEL
BUN: 15 mg/dL (ref 7–25)
CHLORIDE: 88 mmol/L — AB (ref 98–110)
CO2: 21 mmol/L (ref 20–31)
CREATININE: 1.36 mg/dL — AB (ref 0.70–1.18)
Calcium: 10.6 mg/dL — ABNORMAL HIGH (ref 8.6–10.3)
Glucose, Bld: 104 mg/dL — ABNORMAL HIGH (ref 65–99)
Potassium: 5.3 mmol/L (ref 3.5–5.3)
Sodium: 120 mmol/L — CL (ref 135–146)

## 2017-02-01 MED ORDER — SODIUM CHLORIDE 0.9 % IV SOLN: INTRAVENOUS | Status: DC

## 2017-02-01 NOTE — Telephone Encounter (Signed)
Patient has a critical low sodium of 120. Labs are available in EPIC.

## 2017-02-01 NOTE — Telephone Encounter (Signed)
Pt admitted for Pre-hydration on 2/25 for PV angio scheduled on 2/26 with Dr. Gwenlyn Found. Pt aware someone from Bed control will contact him on Sunday afternoon to let him know when a bed will be available.

## 2017-02-01 NOTE — Telephone Encounter (Signed)
Patient is currently at [facility] lab and received call from [place] that lab order is needed. Per chart review (noted below), patient is to have STAT BMET today (in anticipation of LE angiogram on 2/26). Lab ordered.   ________________________  BASIC METABOLIC PANEL WITH GFR  Order: [id number]  Status:  Final result Visible to patient:  No (Not Released) Dx:  Elevated serum creatinine  Notes Recorded by Lorretta Harp, MD on 01/29/2017 at 2:09 PM EST Hold Micardis today, liberalize IV by mouth fluid intake, stat basic metabolic panel first thing Friday morning, determine whether or not he needs to be admitted for hydration on Sunday based on those results.     Left message on vm--ok per DPR--with results. Asked him to call back if he has any questions.  Wil attempt to reach pt again later today if he has not returned call.

## 2017-02-03 ENCOUNTER — Observation Stay (HOSPITAL_COMMUNITY)
Admission: RE | Admit: 2017-02-03 | Discharge: 2017-02-05 | Disposition: A | Discharge status: Home / Self Care | Payer: Medicare Other | Source: Ambulatory Visit | Attending: Cardiovascular Disease | Admitting: Cardiovascular Disease

## 2017-02-03 ENCOUNTER — Encounter (HOSPITAL_COMMUNITY): Payer: Self-pay | Admitting: Cardiology

## 2017-02-03 DIAGNOSIS — R001 Bradycardia, unspecified: Secondary | ICD-10-CM | POA: Diagnosis not present

## 2017-02-03 DIAGNOSIS — N183 Chronic kidney disease, stage 3 unspecified: Secondary | ICD-10-CM

## 2017-02-03 DIAGNOSIS — Z79899 Other long term (current) drug therapy: Secondary | ICD-10-CM | POA: Diagnosis not present

## 2017-02-03 DIAGNOSIS — Z7982 Long term (current) use of aspirin: Secondary | ICD-10-CM | POA: Diagnosis not present

## 2017-02-03 DIAGNOSIS — I70212 Atherosclerosis of native arteries of extremities with intermittent claudication, left leg: Secondary | ICD-10-CM | POA: Diagnosis not present

## 2017-02-03 DIAGNOSIS — Z95820 Peripheral vascular angioplasty status with implants and grafts: Secondary | ICD-10-CM | POA: Insufficient documentation

## 2017-02-03 DIAGNOSIS — Z888 Allergy status to other drugs, medicaments and biological substances status: Secondary | ICD-10-CM | POA: Diagnosis not present

## 2017-02-03 DIAGNOSIS — Y838 Other surgical procedures as the cause of abnormal reaction of the patient, or of later complication, without mention of misadventure at the time of the procedure: Secondary | ICD-10-CM | POA: Diagnosis not present

## 2017-02-03 DIAGNOSIS — E782 Mixed hyperlipidemia: Secondary | ICD-10-CM | POA: Diagnosis present

## 2017-02-03 DIAGNOSIS — N1832 Chronic kidney disease, stage 3b: Secondary | ICD-10-CM

## 2017-02-03 DIAGNOSIS — I129 Hypertensive chronic kidney disease with stage 1 through stage 4 chronic kidney disease, or unspecified chronic kidney disease: Secondary | ICD-10-CM | POA: Insufficient documentation

## 2017-02-03 DIAGNOSIS — I701 Atherosclerosis of renal artery: Secondary | ICD-10-CM | POA: Diagnosis not present

## 2017-02-03 DIAGNOSIS — T82858A Stenosis of vascular prosthetic devices, implants and grafts, initial encounter: Secondary | ICD-10-CM | POA: Diagnosis not present

## 2017-02-03 DIAGNOSIS — Z87891 Personal history of nicotine dependence: Secondary | ICD-10-CM | POA: Diagnosis not present

## 2017-02-03 DIAGNOSIS — I739 Peripheral vascular disease, unspecified: Secondary | ICD-10-CM | POA: Diagnosis not present

## 2017-02-03 DIAGNOSIS — Z7902 Long term (current) use of antithrombotics/antiplatelets: Secondary | ICD-10-CM | POA: Insufficient documentation

## 2017-02-03 DIAGNOSIS — E871 Hypo-osmolality and hyponatremia: Secondary | ICD-10-CM | POA: Diagnosis present

## 2017-02-03 DIAGNOSIS — K219 Gastro-esophageal reflux disease without esophagitis: Secondary | ICD-10-CM | POA: Insufficient documentation

## 2017-02-03 DIAGNOSIS — I10 Essential (primary) hypertension: Secondary | ICD-10-CM | POA: Diagnosis present

## 2017-02-03 DIAGNOSIS — Z01818 Encounter for other preprocedural examination: Secondary | ICD-10-CM

## 2017-02-03 DIAGNOSIS — I779 Disorder of arteries and arterioles, unspecified: Secondary | ICD-10-CM | POA: Diagnosis present

## 2017-02-03 HISTORY — DX: Hypo-osmolality and hyponatremia: E87.1

## 2017-02-03 LAB — BASIC METABOLIC PANEL
Anion gap: 8 (ref 5–15)
BUN: 15 mg/dL (ref 6–20)
CALCIUM: 10.4 mg/dL — AB (ref 8.9–10.3)
CO2: 21 mmol/L — ABNORMAL LOW (ref 22–32)
Chloride: 92 mmol/L — ABNORMAL LOW (ref 101–111)
Creatinine, Ser: 1.43 mg/dL — ABNORMAL HIGH (ref 0.61–1.24)
GFR, EST AFRICAN AMERICAN: 55 mL/min — AB (ref >60)
GFR, EST NON AFRICAN AMERICAN: 47 mL/min — AB (ref >60)
Glucose, Bld: 97 mg/dL (ref 65–99)
Potassium: 5.2 mmol/L — ABNORMAL HIGH (ref 3.5–5.1)
SODIUM: 121 mmol/L — AB (ref 135–145)

## 2017-02-03 LAB — CBC
HCT: 32.3 % — ABNORMAL LOW (ref 39.0–52.0)
HEMOGLOBIN: 11.2 g/dL — AB (ref 13.0–17.0)
MCH: 28.2 pg (ref 26.0–34.0)
MCHC: 34.7 g/dL (ref 30.0–36.0)
MCV: 81.4 fL (ref 78.0–100.0)
PLATELETS: 228 10*3/uL (ref 150–400)
RBC: 3.97 MIL/uL — ABNORMAL LOW (ref 4.22–5.81)
RDW: 12.7 % (ref 11.5–15.5)
WBC: 7.7 10*3/uL (ref 4.0–10.5)

## 2017-02-03 MED ORDER — SODIUM CHLORIDE 0.9 % IV SOLN
INTRAVENOUS | Status: DC
  Administered 2017-02-04: 05:00:00 via INTRAVENOUS

## 2017-02-03 MED ORDER — SODIUM CHLORIDE 0.9 % IV SOLN
INTRAVENOUS | Status: DC
  Administered 2017-02-03: 15:00:00 via INTRAVENOUS

## 2017-02-03 MED ORDER — ACETAMINOPHEN 500 MG PO TABS: 500.0000 mg | ORAL_TABLET | Freq: Four times a day (QID) | ORAL | Status: DC | PRN

## 2017-02-03 MED ORDER — CLOPIDOGREL BISULFATE 75 MG PO TABS
75.0000 mg | ORAL_TABLET | Freq: Every day | ORAL | Status: DC
Start: 2017-02-04 — End: 2017-02-05
  Administered 2017-02-04 – 2017-02-05 (×2): 75 mg via ORAL
  Filled 2017-02-03 (×2): qty 1

## 2017-02-03 MED ORDER — PANTOPRAZOLE SODIUM 20 MG PO TBEC
20.0000 mg | DELAYED_RELEASE_TABLET | Freq: Every day | ORAL | Status: DC | PRN
  Administered 2017-02-05: 08:00:00 20 mg via ORAL
  Filled 2017-02-03: qty 1

## 2017-02-03 MED ORDER — SODIUM CHLORIDE 0.9% FLUSH: 3.0000 mL | Freq: Two times a day (BID) | INTRAVENOUS | Status: DC

## 2017-02-03 MED ORDER — HEPARIN SODIUM (PORCINE) 5000 UNIT/ML IJ SOLN
5000.0000 [IU] | Freq: Three times a day (TID) | INTRAMUSCULAR | Status: DC
  Administered 2017-02-03 – 2017-02-05 (×4): 5000 [IU] via SUBCUTANEOUS
  Filled 2017-02-03 (×3): qty 1

## 2017-02-03 MED ORDER — ASPIRIN EC 81 MG PO TBEC: 81.0000 mg | DELAYED_RELEASE_TABLET | Freq: Every day | ORAL | Status: DC

## 2017-02-03 MED ORDER — DILTIAZEM HCL ER COATED BEADS 240 MG PO CP24
240.0000 mg | ORAL_CAPSULE | Freq: Every day | ORAL | Status: DC
  Administered 2017-02-04 – 2017-02-05 (×2): 240 mg via ORAL
  Filled 2017-02-03 (×4): qty 1

## 2017-02-03 NOTE — H&P (Signed)
Joshua Wyatt is an 71 y.o. male.    Primary Cardiologist:Dr, Gwenlyn Found  PCP: Thressa Sheller, MD  Chief Complaint: pre-hydration for CKD prior to angiogram  HPI: 64 YOM with a hx of PVOD status post bilateral carotid endarterectomies performed by Dr. Drucie Opitz in 2007. We have been following him by duplex ultrasound which performed most recently 01/03/17 revealed this to be widely patent. He has also had bilateral SFA intervention as well as right renal artery stenting. His other problems include hypertension and hyperlipidemia. He did have an episode of chest heaviness recently, but more importantly he has complained of progressive lifestyle limiting claudication.   Most recent lower extremity Dopplers reveal a right ABI of 0.93 and a left of 0.82. He does appear to have moderate iliac disease as well as high grade right SFA and popliteal stenosis. He underwent abdominal aortography with bifemoral runoff on 03/30/13 revealed a widely patent right renal artery stent, 50-60% proximal left renal artery stenosis. The left SFA was while he stayed patent and he had a 95% focal above-the-knee popliteal artery stenosis with three-vessel runoff. There was a 60% focal lesion in the right common iliac artery and a 50% segmental proximal to mid right SFA stenosis with a patent stent in the mid to distal right SFA. He also had a 50% right popliteal stenosis with three-vessel runoff. He underwent cutting balloon angioplasty of the left above-the-knee popliteal artery and Angiosculpt balloon with an excellent angiographic result and was discharged the following day. I performed angiography on him 10/11/14 and recanalized a subtotally occluded right SFA with overlapping via Bahn covered stent performed intervention on his above-the-knee popliteal artery on that side. He had a patent mid to distal left SFA stent with high grade segmental and 6 sequential disease in the proximal SFA and distal SFA on the left.  Progression of Lt calf claudication.   Addition, he's had severe progression of disease in his right internal carotid artery. Dr. Adora Fridge  performed carotid stenting on him 01/03/17 with an excellent result.   Nuc study was done 12/14/16 and neg for ischmia EF 49% but appeared normal on raw images.  Echo 05/2016 with EF 55-60%  Dr. Adora Fridge saw pt on 01/21/17 and made arrangements for pre-admit for IV hydration prior to PV angiography and potential percutaneous intervention of a diffuse 60% left common iliac artery stenosis as well as tandem 80-90% mid left SFA stenosis, 90% left popliteal stenosis with 2 vessel runoff. He has severe lifestyle limiting claudication.        On admit no complaints except claudication.  No chest pain, no SOB.  No palpitations. No dizziness no syncope. Drinking lots of fluids at home. Previous serum sodium was 120.   Past Medical History:  Diagnosis Date  . Bilateral calf pain 03-02-2013   lower ext.doppler-mild arterial insufficiency to lower ext. this is a disease in val  . Cerebral atherosclerosis 03-02-2013   carotid duplex- normal study  . GERD (gastroesophageal reflux disease)   . History of echocardiogram 08-02-2005   normal study   . Hyperlipemia   . Hypertension 03-30-2013   PV Angiogram- rt. renal artery stent was widely open,50-60% lt. renal artery stenosis,lt. SFA stents patent with high grade above the knee  poplital stenosis  . Hyponatremia 02/03/2017  . Kidney stones   . Normal cardiac stress test 03-25-2013   EF 60%, normal stress test ,this is a presurgical  test  . Peripheral vascular  disease (HCC)   . PVD (peripheral vascular disease) (HCC) 10/25/08   5 french tennis racket catheter was used for midstream and distal abd. aortography with bifemeral runoff using digital subtraction bolus chase step-table technique. PTA was performed with 4x4 cordis powerflex,stenting with a 6x6 bard nitinol self expanding stent.reduction of distal SFA stenosis from 80  to 0 % with a 5x4 bard rival balloon   . Renal artery stenosis (HCC) 03-02-2013   renal artery doppler-this was an abnormal doppler    Past Surgical History:  Procedure Laterality Date  . APPENDECTOMY  1960's  . CAROTID ENDARTERECTOMY Bilateral 2000's  . CYSTOSCOPY W/ STONE MANIPULATION  "X 4 or 5"  . FEMORAL ARTERY STENT Right 10/11/2014  . LITHOTRIPSY  "several"  . LOWER EXTREMITY ANGIOGRAM N/A 10/11/2014   Procedure: LOWER EXTREMITY ANGIOGRAM;  Surgeon: Runell Gess, MD;  Location: [facility] C[facility] LAB;  Service: Cardiovascular;  Laterality: N/A;  . PERCUTANEOUS STENT INTERVENTION  10/11/2014   Procedure: PERCUTANEOUS STENT INTERVENTION;  Surgeon: Runell Gess, MD;  Location: [facility] CATH LAB;  Service: Cardiovascular;;  right sfax2  . PERIPHERAL VASCULAR CATHETERIZATION N/A 01/03/2017   Procedure: Carotid PTA/Stent Intervention / Right;  Surgeon: Runell Gess, MD;  Location: [facility] INVASIVE CV LAB;  Service: Cardiovascular;  Laterality: N/A;  . POPLITEAL ARTERY ANGIOPLASTY Left 03/30/2013  . TONSILLECTOMY  1960's    Family History  Problem Relation Age of Onset  . Heart disease Father     CABG  . Healthy Sister   . Healthy Brother    Social History:  reports that he quit smoking about 3 years ago. His smoking use included Cigars and Cigarettes. He quit after 58.00 years of use. He has never used smokeless tobacco. He reports that he does not drink alcohol or use drugs.  Allergies:  Allergies  Allergen Reactions  . Statins Swelling    Muscle pain    Facility-Administered Medications Prior to Admission  Medication Dose Route Frequency Provider Last Rate Last Dose  . 0.9 %  sodium chloride infusion   Intravenous Continuous Runell Gess, MD       Medications Prior to Admission  Medication Sig Dispense Refill  . acetaminophen (TYLENOL) 500 MG tablet Take 500 mg by mouth every 6 (six) hours as needed for moderate pain or headache.    [name] aspirin 81 MG tablet Take 81 mg by mouth  daily.    . clopidogrel (PLAVIX) 75 MG tablet Take 1 tablet (75 mg total) by mouth daily. 30 tablet 5  . diltiazem (DILACOR XR) 240 MG 24 hr capsule Take 1 capsule (240 mg total) by mouth daily. 90 capsule 3  . omeprazole (PRILOSEC OTC) 20 MG tablet Take 20 mg by mouth daily as needed (heartburn).     . potassium chloride SA (K-DUR,KLOR-CON) 20 MEQ tablet Take 1 tablet (20 mEq total) by mouth daily. 30 tablet 3  . telmisartan (MICARDIS) 80 MG tablet Take 80 mg by mouth daily.       Results for orders placed or performed during the hospital encounter of 02/03/17 (from the past 48 hour(s))  CBC     Status: Abnormal   Collection Time: 02/03/17  2:00 PM  Result Value Ref Range   WBC 7.7 4.0 - 10.5 K/uL   RBC 3.97 (L) 4.22 - 5.81 MIL/uL   Hemoglobin 11.2 (L) 13.0 - 17.0 g/dL   HCT 11.3 (L) 94.9 - 14.8 %   MCV 81.4 78.0 - 100.0 fL   MCH 28.2  26.0 - 34.0 pg   MCHC 34.7 30.0 - 36.0 g/dL   RDW 12.7 11.5 - 15.5 %   Platelets 228 150 - 400 K/uL  Basic metabolic panel     Status: Abnormal   Collection Time: 02/03/17  2:00 PM  Result Value Ref Range   Sodium 121 (L) 135 - 145 mmol/L   Potassium 5.2 (H) 3.5 - 5.1 mmol/L   Chloride 92 (L) 101 - 111 mmol/L   CO2 21 (L) 22 - 32 mmol/L   Glucose, Bld 97 65 - 99 mg/dL   BUN 15 6 - 20 mg/dL   Creatinine, Ser 1.43 (H) 0.61 - 1.24 mg/dL   Calcium 10.4 (H) 8.9 - 10.3 mg/dL   GFR calc non Af Amer 47 (L) >60 mL/min   GFR calc Af Amer 55 (L) >60 mL/min    Comment: (NOTE) The eGFR has been calculated using the CKD EPI equation. This calculation has not been validated in all clinical situations. eGFR's persistently <60 mL/min signify possible Chronic Kidney Disease.    Anion gap 8 5 - 15   No results found.  ROS: General:no colds or fevers, no weight changes Skin:no rashes or ulcers HEENT:no blurred vision, no congestion CV:see HPI PUL:see HPI GI:no diarrhea constipation or melena, no indigestion GU:no hematuria, no dysuria MS:no joint pain,  no claudication Neuro:no syncope, no lightheadedness Endo:no diabetes, no thyroid disease   Blood pressure (!) 158/59, pulse (!) 52, temperature 97.9 F (36.6 C), temperature source Oral, resp. rate 18, height _0  (1.702 m), weight 72.4 kg (159 lb 9.8 oz), SpO2 99 %. PE: General:Pleasant affect, NAD Skin:Warm and dry, brisk capillary refill HEENT:normocephalic, sclera clear, mucus membranes moist Neck:supple, no JVD, + Lt bruit  Heart:S1S2 RRR with soft systolic murmur, no gallup, rub or click Lungs:clear without rales, rhonchi, or wheezes CZY:SAYT, non tender, + BS, do not palpate liver spleen or masses Ext:no lower ext edema, no palpable pedal pulses, 2+ radial pulses Neuro:alert and oriented X 3, MAE, follows commands, + facial symmetry    Assessment/Plan  Principal Problem:   PAD with Severe claudication (HCC)- plan for PV procedure in AM at 0800  Active Problems: CKD (chronic kidney disease) stage 3, GFR 30-59 ml/min- hold micardis for now, IV fluids at 75cc/hr.    HLD (hyperlipidemia)- intolerance/allergy to statins - pt states his mouth became swollen along with leg pain.   TG 474, Tchol 331, HDL 35 - we discussed PCSK9 he was worried about side effects    Essential hypertension- mildly elevated currently     Bilateral carotid artery disease (HCC) recent stent on Plavix    PAD (peripheral artery disease) ([place])  see above    Hyponatremia was 120 on Friday will recheck. This was a drop, he has been drinking a lot of fluid in preparation for procedure.    Bradycardia will monitor.       Glori Bickers Nurse Practitioner Certified [place] Pager [phone] or after 5pm or weekends call [phone] 02/03/2017, 3:19 PM  Patient seen and examined with Cecilie Kicks, NP. We discussed all aspects of the encounter. I agree with the assessment and plan as stated above.   Stable from cardiac perspective. Creatinine stable at 1.4. Will proceed with  pre-cath hydration. Serum sodium up slightly to 121. Likely due to high intake of free H2O. Will limit free H2O. For PV procedure tomorrow with Dr. Gwenlyn Found. Continue Plavix.   Bensimhon, Daniel,MD 3:20 PM

## 2017-02-03 NOTE — Progress Notes (Signed)
02/03/2017 1300 Received Direct Admit from home to room 2W11, here for hydration before procedure tomorrow.  Tele monitor applied and CCMD notified.  Oriented to room, call light and bed.  Call bell in reach.  Family at bedside. [place]

## 2017-02-04 ENCOUNTER — Encounter (HOSPITAL_COMMUNITY): Payer: Self-pay | Admitting: Cardiovascular Disease

## 2017-02-04 ENCOUNTER — Encounter (HOSPITAL_COMMUNITY)
Admission: RE | Disposition: A | Discharge status: Home / Self Care | Payer: Self-pay | Source: Ambulatory Visit | Attending: Cardiovascular Disease

## 2017-02-04 DIAGNOSIS — Z888 Allergy status to other drugs, medicaments and biological substances status: Secondary | ICD-10-CM | POA: Diagnosis not present

## 2017-02-04 DIAGNOSIS — K219 Gastro-esophageal reflux disease without esophagitis: Secondary | ICD-10-CM | POA: Diagnosis not present

## 2017-02-04 DIAGNOSIS — Z79899 Other long term (current) drug therapy: Secondary | ICD-10-CM | POA: Diagnosis not present

## 2017-02-04 DIAGNOSIS — N183 Chronic kidney disease, stage 3 (moderate): Secondary | ICD-10-CM | POA: Diagnosis not present

## 2017-02-04 DIAGNOSIS — E782 Mixed hyperlipidemia: Secondary | ICD-10-CM | POA: Diagnosis not present

## 2017-02-04 DIAGNOSIS — Z7982 Long term (current) use of aspirin: Secondary | ICD-10-CM | POA: Diagnosis not present

## 2017-02-04 DIAGNOSIS — Z95820 Peripheral vascular angioplasty status with implants and grafts: Secondary | ICD-10-CM | POA: Diagnosis not present

## 2017-02-04 DIAGNOSIS — R001 Bradycardia, unspecified: Secondary | ICD-10-CM | POA: Diagnosis not present

## 2017-02-04 DIAGNOSIS — T82858A Stenosis of vascular prosthetic devices, implants and grafts, initial encounter: Secondary | ICD-10-CM | POA: Diagnosis not present

## 2017-02-04 DIAGNOSIS — I129 Hypertensive chronic kidney disease with stage 1 through stage 4 chronic kidney disease, or unspecified chronic kidney disease: Secondary | ICD-10-CM | POA: Diagnosis not present

## 2017-02-04 DIAGNOSIS — Z7902 Long term (current) use of antithrombotics/antiplatelets: Secondary | ICD-10-CM | POA: Diagnosis not present

## 2017-02-04 DIAGNOSIS — I739 Peripheral vascular disease, unspecified: Secondary | ICD-10-CM | POA: Diagnosis not present

## 2017-02-04 DIAGNOSIS — I70212 Atherosclerosis of native arteries of extremities with intermittent claudication, left leg: Secondary | ICD-10-CM | POA: Diagnosis not present

## 2017-02-04 DIAGNOSIS — Z87891 Personal history of nicotine dependence: Secondary | ICD-10-CM | POA: Diagnosis not present

## 2017-02-04 DIAGNOSIS — E871 Hypo-osmolality and hyponatremia: Secondary | ICD-10-CM | POA: Diagnosis not present

## 2017-02-04 DIAGNOSIS — N189 Chronic kidney disease, unspecified: Secondary | ICD-10-CM

## 2017-02-04 DIAGNOSIS — I701 Atherosclerosis of renal artery: Secondary | ICD-10-CM | POA: Diagnosis not present

## 2017-02-04 HISTORY — PX: LOWER EXTREMITY ANGIOGRAPHY: CATH118251

## 2017-02-04 HISTORY — PX: PERIPHERAL VASCULAR INTERVENTION: CATH118257

## 2017-02-04 LAB — COMPREHENSIVE METABOLIC PANEL
ALK PHOS: 141 U/L — AB (ref 38–126)
ALT: 13 U/L — AB (ref 17–63)
AST: 20 U/L (ref 15–41)
Albumin: 3.7 g/dL (ref 3.5–5.0)
Anion gap: 8 (ref 5–15)
BILIRUBIN TOTAL: 0.6 mg/dL (ref 0.3–1.2)
BUN: 14 mg/dL (ref 6–20)
CO2: 22 mmol/L (ref 22–32)
CREATININE: 1.41 mg/dL — AB (ref 0.61–1.24)
Calcium: 10 mg/dL (ref 8.9–10.3)
Chloride: 94 mmol/L — ABNORMAL LOW (ref 101–111)
GFR calc Af Amer: 56 mL/min — ABNORMAL LOW (ref >60)
GFR calc non Af Amer: 48 mL/min — ABNORMAL LOW (ref >60)
Glucose, Bld: 94 mg/dL (ref 65–99)
Potassium: 4.4 mmol/L (ref 3.5–5.1)
Sodium: 124 mmol/L — ABNORMAL LOW (ref 135–145)
TOTAL PROTEIN: 6.8 g/dL (ref 6.5–8.1)

## 2017-02-04 LAB — PROTIME-INR
INR: 0.98
Prothrombin Time: 13 seconds (ref 11.4–15.2)

## 2017-02-04 LAB — CBC
HCT: 30.9 % — ABNORMAL LOW (ref 39.0–52.0)
Hemoglobin: 10.7 g/dL — ABNORMAL LOW (ref 13.0–17.0)
MCH: 28.6 pg (ref 26.0–34.0)
MCHC: 34.6 g/dL (ref 30.0–36.0)
MCV: 82.6 fL (ref 78.0–100.0)
PLATELETS: 205 10*3/uL (ref 150–400)
RBC: 3.74 MIL/uL — ABNORMAL LOW (ref 4.22–5.81)
RDW: 13.1 % (ref 11.5–15.5)
WBC: 7.6 10*3/uL (ref 4.0–10.5)

## 2017-02-04 LAB — POCT ACTIVATED CLOTTING TIME
ACTIVATED CLOTTING TIME: 241 s
Activated Clotting Time: 246 seconds
Activated Clotting Time: 191 seconds
Activated Clotting Time: 175 seconds

## 2017-02-04 SURGERY — LOWER EXTREMITY ANGIOGRAPHY

## 2017-02-04 MED ORDER — SODIUM CHLORIDE 0.9% FLUSH: 3.0000 mL | INTRAVENOUS | Status: DC | PRN

## 2017-02-04 MED ORDER — ASPIRIN 81 MG PO CHEW
81.0000 mg | CHEWABLE_TABLET | ORAL | Status: AC
  Administered 2017-02-04: 81 mg via ORAL
  Filled 2017-02-04 (×2): qty 1

## 2017-02-04 MED ORDER — HEPARIN SODIUM (PORCINE) 1000 UNIT/ML IJ SOLN
INTRAMUSCULAR | Status: DC | PRN
  Administered 2017-02-04: 6000 [IU] via INTRAVENOUS
  Administered 2017-02-04: 2500 [IU] via INTRAVENOUS

## 2017-02-04 MED ORDER — LIDOCAINE HCL (PF) 1 % IJ SOLN
INTRAMUSCULAR | Status: DC | PRN
Start: 2017-02-04 — End: 2017-02-04
  Administered 2017-02-04: 15 mL

## 2017-02-04 MED ORDER — ACETAMINOPHEN 325 MG PO TABS
650.0000 mg | ORAL_TABLET | ORAL | Status: DC | PRN
  Administered 2017-02-05: 650 mg via ORAL
  Filled 2017-02-04: qty 2

## 2017-02-04 MED ORDER — SODIUM CHLORIDE 0.9 % WEIGHT BASED INFUSION: 3.0000 mL/kg/h | INTRAVENOUS | Status: DC

## 2017-02-04 MED ORDER — HEPARIN (PORCINE) IN NACL 2-0.9 UNIT/ML-% IJ SOLN
INTRAMUSCULAR | Status: AC
  Filled 2017-02-04: qty 1000

## 2017-02-04 MED ORDER — ANGIOPLASTY BOOK
Freq: Once | Status: AC
  Administered 2017-02-04: 20:00:00
  Filled 2017-02-04: qty 1

## 2017-02-04 MED ORDER — ASPIRIN EC 81 MG PO TBEC
81.0000 mg | DELAYED_RELEASE_TABLET | Freq: Every day | ORAL | Status: DC
  Administered 2017-02-05: 81 mg via ORAL
  Filled 2017-02-04: qty 1

## 2017-02-04 MED ORDER — IODIXANOL 320 MG/ML IV SOLN
INTRAVENOUS | Status: DC | PRN
  Administered 2017-02-04: 150 mL via INTRA_ARTERIAL

## 2017-02-04 MED ORDER — HEPARIN (PORCINE) IN NACL 2-0.9 UNIT/ML-% IJ SOLN
INTRAMUSCULAR | Status: DC | PRN
  Administered 2017-02-04: 1000 mL

## 2017-02-04 MED ORDER — CLOPIDOGREL BISULFATE 75 MG PO TABS: 75.0000 mg | ORAL_TABLET | Freq: Every day | ORAL | Status: DC

## 2017-02-04 MED ORDER — HEPARIN SODIUM (PORCINE) 1000 UNIT/ML IJ SOLN
INTRAMUSCULAR | Status: AC
  Filled 2017-02-04: qty 1

## 2017-02-04 MED ORDER — HYDRALAZINE HCL 20 MG/ML IJ SOLN
10.0000 mg | INTRAMUSCULAR | Status: DC | PRN
  Administered 2017-02-04: 10 mg via INTRAVENOUS
  Filled 2017-02-04: qty 1

## 2017-02-04 MED ORDER — MIDAZOLAM HCL 2 MG/2ML IJ SOLN
INTRAMUSCULAR | Status: DC | PRN
  Administered 2017-02-04: 1 mg via INTRAVENOUS

## 2017-02-04 MED ORDER — ASPIRIN EC 81 MG PO TBEC: 81.0000 mg | DELAYED_RELEASE_TABLET | Freq: Every day | ORAL | Status: DC

## 2017-02-04 MED ORDER — SODIUM CHLORIDE 0.9 % IV SOLN: INTRAVENOUS | Status: AC

## 2017-02-04 MED ORDER — LIDOCAINE HCL (PF) 1 % IJ SOLN
INTRAMUSCULAR | Status: AC
  Filled 2017-02-04: qty 30

## 2017-02-04 MED ORDER — FENTANYL CITRATE (PF) 100 MCG/2ML IJ SOLN
INTRAMUSCULAR | Status: AC
  Filled 2017-02-04: qty 2

## 2017-02-04 MED ORDER — SODIUM CHLORIDE 0.9 % WEIGHT BASED INFUSION: 1.0000 mL/kg/h | INTRAVENOUS | Status: DC

## 2017-02-04 MED ORDER — ONDANSETRON HCL 4 MG/2ML IJ SOLN: 4.0000 mg | Freq: Four times a day (QID) | INTRAMUSCULAR | Status: DC | PRN

## 2017-02-04 MED ORDER — SODIUM CHLORIDE 0.9 % WEIGHT BASED INFUSION
3.0000 mL/kg/h | INTRAVENOUS | Status: DC
Start: 2017-02-04 — End: 2017-02-04

## 2017-02-04 MED ORDER — MIDAZOLAM HCL 2 MG/2ML IJ SOLN
INTRAMUSCULAR | Status: AC
  Filled 2017-02-04: qty 2

## 2017-02-04 MED ORDER — FENTANYL CITRATE (PF) 100 MCG/2ML IJ SOLN
INTRAMUSCULAR | Status: DC | PRN
Start: 2017-02-04 — End: 2017-02-04
  Administered 2017-02-04: 25 ug via INTRAVENOUS

## 2017-02-04 SURGICAL SUPPLY — 25 items
BALLN ANGIOSCULPT 5X100 (BALLOONS) ×3
BALLN COYOTE OTW 3X80X150 (BALLOONS) ×3
BALLN IN.PACT DCB 5X120 (BALLOONS) ×3
BALLN IN.PACT DCB 5X150 (BALLOONS) ×3
BALLOON ANGIOSCULPT 5X100 (BALLOONS) IMPLANT
BALLOON COYOTE OTW 3X80X150 (BALLOONS) IMPLANT
CATH CROSS OVER TEMPO 5F (CATHETERS) ×1 IMPLANT
CATH QUICKCROSS .018X135CM (MICROCATHETER) ×1 IMPLANT
CATH STRAIGHT 5FR 65CM (CATHETERS) ×1 IMPLANT
DCB IN.PACT 5X120 (BALLOONS) IMPLANT
DCB IN.PACT 5X150 (BALLOONS) IMPLANT
KIT ENCORE 26 ADVANTAGE (KITS) ×2 IMPLANT
KIT PV (KITS) ×3 IMPLANT
SHEATH HIGHFLEX ANSEL 7FR 55CM (SHEATH) ×1 IMPLANT
SHEATH PINNACLE 5F 10CM (SHEATH) ×1 IMPLANT
SHEATH PINNACLE 7F 10CM (SHEATH) ×1 IMPLANT
STOPCOCK MORSE 400PSI 3WAY (MISCELLANEOUS) ×1 IMPLANT
SYRINGE MEDRAD AVANTA MACH 7 (SYRINGE) ×1 IMPLANT
TAPE RADIOPAQUE TURBO (MISCELLANEOUS) ×1 IMPLANT
TRANSDUCER W/STOPCOCK (MISCELLANEOUS) ×3 IMPLANT
TRAY PV CATH (CUSTOM PROCEDURE TRAY) ×3 IMPLANT
TUBING CIL FLEX 10 FLL-RA (TUBING) ×1 IMPLANT
WIRE HITORQ VERSACORE ST 145CM (WIRE) ×1 IMPLANT
WIRE SPARTACORE .014X190CM (WIRE) IMPLANT
WIRE SPARTACORE .014X300CM (WIRE) ×1 IMPLANT

## 2017-02-04 NOTE — Care Management Note (Signed)
Case Management Note  Patient Details  Name: Joshua Wyatt MRN: [id number] Date of Birth: 1946-05-17  Subjective/Objective:    S/p pv intervention, will be on plavix and asa, NCM will cont to follow for dc needs.                 Action/Plan:   Expected Discharge Date:                  Expected Discharge Plan:     In-House Referral:     Discharge planning Services  CM Consult  Post Acute Care Choice:    Choice offered to:     DME Arranged:    DME Agency:     HH Arranged:    HH Agency:     Status of Service:  In process, will continue to follow  If discussed at Long Length of Stay Meetings, dates discussed:    Additional Comments:  Zenon Mayo, RN 02/04/2017, 4:26 PM

## 2017-02-04 NOTE — Progress Notes (Signed)
Site area: right groin  Site Prior to Removal:  Level 0  Pressure Applied For 20 MINUTES    Minutes Beginning at 1310  Manual:   Yes.    Patient Status During Pull:  Stable   Post Pull Groin Site:  Level 0  Post Pull Instructions Given:  Yes.    Post Pull Pulses Present:  Yes.    Dressing Applied:  Yes.    Comments:  Checked frequently the remainder of shift with no change noted

## 2017-02-04 NOTE — Interval H&P Note (Signed)
History and Physical Interval Note:  02/04/2017 8:15 AM  Joshua Wyatt  has presented today for surgery, with the diagnosis of pvd  The various methods of treatment have been discussed with the patient and family. After consideration of risks, benefits and other options for treatment, the patient has consented to  Procedure(s): Lower Extremity Angiography (N/A) as a surgical intervention .  The patient's history has been reviewed, patient examined, no change in status, stable for surgery.  I have reviewed the patient's chart and labs.  Questions were answered to the patient's satisfaction.     [name]

## 2017-02-04 NOTE — Progress Notes (Signed)
Progress Note  Patient Name: Joshua Wyatt Date of Encounter: 02/04/2017  Primary Cardiologist: Dr. Gwenlyn Found  Subjective   Pt is feeling well without right groin pain, S/P PV intervention.   Inpatient Medications    Scheduled Meds: . [START ON 02/05/2017] aspirin EC  81 mg Oral Daily  . clopidogrel  75 mg Oral Daily  . diltiazem  240 mg Oral Daily  . heparin  5,000 Units Subcutaneous Q8H  . sodium chloride flush  3 mL Intravenous Q12H   Continuous Infusions: . sodium chloride Stopped (02/04/17 0548)  . sodium chloride 75 mL/hr at 02/03/17 1504  . sodium chloride     PRN Meds: acetaminophen, hydrALAZINE, ondansetron (ZOFRAN) IV, pantoprazole   Vital Signs    Vitals:   02/04/17 1325 02/04/17 1330 02/04/17 1335 02/04/17 1340  BP: (!) 162/64 (!) 141/70 (!) 158/69 (!) 175/60  Pulse: 72 69 74 69  Resp: $Remo'14 12 12 12  'WwDqb$ Temp:      TempSrc:      SpO2: 100% 100% 100% 100%  Weight:      Height:        Intake/Output Summary (Last 24 hours) at 02/04/17 1550 Last data filed at 02/04/17 1445  Gross per 24 hour  Intake             1520 ml  Output              750 ml  Net              770 ml   Filed Weights   02/03/17 1306 02/04/17 0444  Weight: 159 lb 9.8 oz (72.4 kg) 153 lb 8 oz (69.6 kg)    Telemetry    Non-Tele  ECG    N/A  Physical Exam   GEN: No acute distress.   Neck: No JVD, bilateral harsh carotid bruits Cardiac: RRR, rubs, or gallops. 2/6 systolic murmur Respiratory: Clear to auscultation bilaterally. GI: Soft, nontender, non-distended  MS: No edema; No deformity. Neuro:  Nonfocal  Psych: Normal affect   Right groin without hematoma or bruit.  Labs    Chemistry Recent Labs Lab 02/01/17 0904 02/03/17 1400 02/04/17 0156  NA 120* 121* 124*  K 5.3 5.2* 4.4  CL 88* 92* 94*  CO2 21 21* 22  GLUCOSE 104* 97 94  BUN $Re'15 15 14  'tkK$ CREATININE 1.36* 1.43* 1.41*  CALCIUM 10.6* 10.4* 10.0  PROT  --   --  6.8  ALBUMIN  --   --  3.7  AST  --   --  20    ALT  --   --  13*  ALKPHOS  --   --  141*  BILITOT  --   --  0.6  GFRNONAA  --  47* 48*  GFRAA  --  55* 56*  ANIONGAP  --  8 8     Hematology Recent Labs Lab 02/03/17 1400 02/04/17 0156  WBC 7.7 7.6  RBC 3.97* 3.74*  HGB 11.2* 10.7*  HCT 32.3* 30.9*  MCV 81.4 82.6  MCH 28.2 28.6  MCHC 34.7 34.6  RDW 12.7 13.1  PLT 228 205    Cardiac EnzymesNo results for input(s): TROPONINI in the last 168 hours. No results for input(s): TROPIPOC in the last 168 hours.   BNPNo results for input(s): BNP, PROBNP in the last 168 hours.   DDimer No results for input(s): DDIMER in the last 168 hours.   Radiology    No results found.  Cardiac Studies  PV angiogram/Intervention 1: Left lower extremity-the proximal and mid third of the left SFA had diffuse 80-90% segmental stenosis. The previously placed nylon stent in the adductor canal with occluded with 90-95% stenosis just above this. The left popliteal artery had diffuse 75% stenosis with 2 vessel runoff. The anterior tibial was occluded.  Successful angiosculpt  cutting balloon atherectomy followed by drug-eluting balloon angioplasty of the distal left SFA and above-the-knee popliteal artery for lifestyle limiting claudication. The patient was already on dual antiplatelet therapy. The sheath will be removed once the eighth 2000 170 pressure held. The patient will be hydrated overnight and discharged home in the morning. We'll obtain outpatient lower extremity arterial Doppler studies in our [facility] office next week and I will see him back 2-3 weeks thereafter.   Patient Profile     71 y.o. male with a hx of PVOD status post bilateral carotid endarterectomies performed by Dr. Drucie Opitz in 2007.  He has had bilateral SFA intervention as well as right renal artery stenting. His other problems include hypertension and hyperlipidemia. He has progressive lifestyle limiting claudication. He is here for PV angiography and  intervention.  Assessment & Plan    PAD -PV angiogram showed 80-90% segmental stenosis in proximal and mid third of the left SFA. Dr. Gwenlyn Found completed successful angiosculpt cutting balloon atherectomy followed by drug eluting balloon angioplasty of the distal SFA and above the knee popliteal artery as above without complications.  -Right groin without hematoma or bruit. -The plan is for overnight hydration and discharge in the morning with follow up outpatient lower extremity arterial doppler studies in the [facility] Office next week and office visit with Dr. Gwenlyn Found in 2-3 weeks.  -Continue dual antiplatelet therapy with Plavix and aspirin  CKD stage 3 -IV fluids at 75 ml/hr over night. Pt has been pushing fluids at home in preparation for procedure. -Micardis on hold. -SCr 1.41 today. Will recheck in am.  Hypertension -Elevated BP's. Micardis is on hold to reduce impact of dye on kidney. Hydralazine prn is ordered.  -Home BP's are well controlled when he is taking the micardis.  -Continue cardizem  HLD (hyperlipidemia) - intolerance/allergy to statins - per office note, pt states his mouth became swollen along with leg pain.   TG 474, Tchol 331, HDL 35  -PCSK9 inhibitor therapy was discussed and he was worried about side effects. Consider having pt seen at lipid clinic.   Signed, Daune Perch, NP  02/04/2017, 3:50 PM    Patient seen and examined. Agree with assessment and plan. No chest pain. R groin stable; Angiographis findings and intervention reviewed.  L leg warm with good distal pulses.  If pt cannot tolerate statins would add zetia and fenofibrates with INC TG and Chol prior to dc tomorrow;  Consider PSCK9 inhibition.   Troy Sine, MD, [facility] 02/04/2017 4:29 PM

## 2017-02-04 NOTE — H&P (View-Only) (Signed)
01/23/2017 Joshua Wyatt   Oct 23, 1946  [id number]  Primary Physician Thressa Sheller, MD Primary Cardiologist: Lorretta Harp MD Renae Gloss  HPI:  The patient is a 71 year old thin appearing married Caucasian male with no children who I last saw in the office 12/12/16.[name] He has a history of PVOD status post bilateral carotid endarterectomies performed by Dr. Drucie Opitz in 2007. We have been following him by duplex ultrasound which performed most recently several days ago revealed this to be widely patent. He has also had bilateral SFA intervention as well as right renal artery stenting. His other problems include hypertension and hyperlipidemia. He did have an episode of chest heaviness recently, but more importantly he has complained of progressive lifestyle limiting claudication since I last saw him. Most recent lower extremity Dopplers reveal a right ABI of 0.93 and a left of 0.82. He does appear to have moderate iliac disease as well as high grade right SFA and popliteal stenosis. He underwent abdominal aortography with bifemoral runoff on 03/30/13 revealed a widely patent right renal artery stent, 50-60% proximal left renal artery stenosis. The left SFA was while he stayed patent and he had a 95% focal above-the-knee popliteal artery stenosis with three-vessel runoff. There was a 60% focal lesion in the right common iliac artery and a 50% segmental proximal to mid right SFA stenosis with a patent stent in the mid to distal right SFA. He also had a 50% right popliteal stenosis with three-vessel runoff. He underwent cutting balloon angioplasty of the left above-the-knee popliteal artery and Angiosculpt balloon with an excellent angiographic result and was discharged the following day. I performed angiography on him 10/11/14 and recanalized a subtotally occluded right SFA with overlapping via Bahn covered stent performed intervention on his above-the-knee popliteal artery on that side. He  had a patent mid to distal left SFA stent with high grade segmental and 6 sequential disease in the proximal SFA and distal SFA on the left. Since I saw him a year and a half ago he's had progressive lifestyle limiting claudication in his left calf. Addition, he's had severe progression of disease in his right internal carotid artery. I performed carotid stenting on him 01/03/17 with an excellent result.   Current Outpatient Prescriptions  Medication Sig Dispense Refill  . aspirin 81 MG tablet Take 81 mg by mouth daily.    . clopidogrel (PLAVIX) 75 MG tablet Take 1 tablet (75 mg total) by mouth daily. 30 tablet 5  . diltiazem (DILACOR XR) 240 MG 24 hr capsule Take 1 capsule (240 mg total) by mouth daily. 90 capsule 3  . omeprazole (PRILOSEC OTC) 20 MG tablet Take 20 mg by mouth daily as needed (heartburn).     . potassium chloride SA (K-DUR,KLOR-CON) 20 MEQ tablet Take 1 tablet (20 mEq total) by mouth daily. 30 tablet 3  . telmisartan (MICARDIS) 80 MG tablet Take 80 mg by mouth daily.      No current facility-administered medications for this visit.     Allergies  Allergen Reactions  . Statins Other (See Comments)    unspecified    Social History   Social History  . Marital status: Married    Spouse name: N/A  . Number of children: N/A  . Years of education: N/A   Occupational History  . Not on file.   Social History Main Topics  . Smoking status: Former Smoker    Years: 58.00    Types: Cigars, Cigarettes  Quit date: 10/18/2013  . Smokeless tobacco: Never Used  . Alcohol use No  . Drug use: No  . Sexual activity: Yes     Comment: RARE  CIG[employer]   Other Topics Concern  . Not on file   Social History Narrative  . No narrative on file     Review of Systems: General: negative for chills, fever, night sweats or weight changes.  Cardiovascular: negative for chest pain, dyspnea on exertion, edema, orthopnea, palpitations, paroxysmal nocturnal dyspnea or shortness of  breath Dermatological: negative for rash Respiratory: negative for cough or wheezing Urologic: negative for hematuria Abdominal: negative for nausea, vomiting, diarrhea, bright red blood per rectum, melena, or hematemesis Neurologic: negative for visual changes, syncope, or dizziness All other systems reviewed and are otherwise negative except as noted above.    Blood pressure 136/80, pulse 83, height 5' 7.5" (1.715 m), weight 158 lb 6.4 oz (71.8 kg), SpO2 98 %.  General appearance: alert and no distress Neck: no adenopathy, no carotid bruit, no JVD, supple, symmetrical, trachea midline and thyroid not enlarged, symmetric, no tenderness/mass/nodules Lungs: clear to auscultation bilaterally Heart: regular rate and rhythm, S1, S2 normal, no murmur, click, rub or gallop Extremities: extremities normal, atraumatic, no cyanosis or edema  EKG sinus rhythm 83 without ST or T-wave changes. I personally reviewed this EKG  ASSESSMENT AND PLAN:   Bilateral carotid artery disease [facility]) [name] has had bilateral carotid endarterectomies performed by Dr. Amedeo Plenty in 2007. Dopplers showed progression of disease on the right. He underwent carotid stenting by myself and Dr. Trula Slade on 01/03/17 with an excellent result. His right common femoral puncture site is well-healed. We will recheck carotid Doppler studies. He is on dual antiplatelet therapy.  Claudication of left lower extremity ([place]) History of severe bilateral lower extremity peripheral vascular occlusive disease status post multiple interventions in the past most recently 10/11/14. He had 3 Viabahn  stents placed in his right SFA. His right ABI is normal. His left ABI has declined. He does have a diffuse 60% left common iliac artery stenosis as well as tandem 8090% mid left SFA stenosis, 90% left popliteal stenosis with 2 vessel runoff. He has severe lifestyle limiting claudication. Plan on performing angiography and potential percutaneous  intervention in the next several weeks.      Lorretta Harp MD FACP,FACC,FAHA, [facility] 01/23/2017 2:44 PM

## 2017-02-05 ENCOUNTER — Other Ambulatory Visit: Payer: Self-pay | Admitting: Cardiology

## 2017-02-05 ENCOUNTER — Encounter (HOSPITAL_COMMUNITY): Payer: Self-pay | Admitting: Cardiology

## 2017-02-05 ENCOUNTER — Other Ambulatory Visit: Payer: Self-pay | Admitting: Cardiovascular Disease

## 2017-02-05 DIAGNOSIS — I70212 Atherosclerosis of native arteries of extremities with intermittent claudication, left leg: Secondary | ICD-10-CM | POA: Diagnosis not present

## 2017-02-05 DIAGNOSIS — Z888 Allergy status to other drugs, medicaments and biological substances status: Secondary | ICD-10-CM | POA: Diagnosis not present

## 2017-02-05 DIAGNOSIS — Z7902 Long term (current) use of antithrombotics/antiplatelets: Secondary | ICD-10-CM | POA: Diagnosis not present

## 2017-02-05 DIAGNOSIS — Z79899 Other long term (current) drug therapy: Secondary | ICD-10-CM | POA: Diagnosis not present

## 2017-02-05 DIAGNOSIS — Z87891 Personal history of nicotine dependence: Secondary | ICD-10-CM | POA: Diagnosis not present

## 2017-02-05 DIAGNOSIS — I739 Peripheral vascular disease, unspecified: Secondary | ICD-10-CM

## 2017-02-05 DIAGNOSIS — I701 Atherosclerosis of renal artery: Secondary | ICD-10-CM | POA: Diagnosis not present

## 2017-02-05 DIAGNOSIS — I10 Essential (primary) hypertension: Secondary | ICD-10-CM

## 2017-02-05 DIAGNOSIS — T82858A Stenosis of vascular prosthetic devices, implants and grafts, initial encounter: Secondary | ICD-10-CM | POA: Diagnosis not present

## 2017-02-05 DIAGNOSIS — E782 Mixed hyperlipidemia: Secondary | ICD-10-CM | POA: Diagnosis not present

## 2017-02-05 DIAGNOSIS — I779 Disorder of arteries and arterioles, unspecified: Secondary | ICD-10-CM

## 2017-02-05 DIAGNOSIS — I129 Hypertensive chronic kidney disease with stage 1 through stage 4 chronic kidney disease, or unspecified chronic kidney disease: Secondary | ICD-10-CM | POA: Diagnosis not present

## 2017-02-05 DIAGNOSIS — K219 Gastro-esophageal reflux disease without esophagitis: Secondary | ICD-10-CM | POA: Diagnosis not present

## 2017-02-05 DIAGNOSIS — R001 Bradycardia, unspecified: Secondary | ICD-10-CM | POA: Diagnosis not present

## 2017-02-05 DIAGNOSIS — N183 Chronic kidney disease, stage 3 (moderate): Secondary | ICD-10-CM | POA: Diagnosis not present

## 2017-02-05 DIAGNOSIS — Z95820 Peripheral vascular angioplasty status with implants and grafts: Secondary | ICD-10-CM | POA: Diagnosis not present

## 2017-02-05 DIAGNOSIS — Z7982 Long term (current) use of aspirin: Secondary | ICD-10-CM | POA: Diagnosis not present

## 2017-02-05 DIAGNOSIS — E871 Hypo-osmolality and hyponatremia: Secondary | ICD-10-CM | POA: Diagnosis not present

## 2017-02-05 LAB — CBC
HEMATOCRIT: 31.8 % — AB (ref 39.0–52.0)
Hemoglobin: 10.7 g/dL — ABNORMAL LOW (ref 13.0–17.0)
MCH: 28.4 pg (ref 26.0–34.0)
MCHC: 33.6 g/dL (ref 30.0–36.0)
MCV: 84.4 fL (ref 78.0–100.0)
Platelets: 211 10*3/uL (ref 150–400)
RBC: 3.77 MIL/uL — ABNORMAL LOW (ref 4.22–5.81)
RDW: 13.5 % (ref 11.5–15.5)
WBC: 7.1 10*3/uL (ref 4.0–10.5)

## 2017-02-05 LAB — BASIC METABOLIC PANEL
Anion gap: 9 (ref 5–15)
BUN: 16 mg/dL (ref 6–20)
CALCIUM: 10.3 mg/dL (ref 8.9–10.3)
CO2: 23 mmol/L (ref 22–32)
Chloride: 97 mmol/L — ABNORMAL LOW (ref 101–111)
Creatinine, Ser: 1.29 mg/dL — ABNORMAL HIGH (ref 0.61–1.24)
GFR calc Af Amer: >60 mL/min (ref >60)
GFR calc non Af Amer: 54 mL/min — ABNORMAL LOW (ref >60)
GLUCOSE: 98 mg/dL (ref 65–99)
POTASSIUM: 4.6 mmol/L (ref 3.5–5.1)
Sodium: 129 mmol/L — ABNORMAL LOW (ref 135–145)

## 2017-02-05 MED ORDER — PANTOPRAZOLE SODIUM 40 MG PO TBEC: 40.0000 mg | DELAYED_RELEASE_TABLET | Freq: Every day | ORAL | 5 refills | Status: DC

## 2017-02-05 MED ORDER — FENOFIBRATE 145 MG PO TABS: 145.0000 mg | ORAL_TABLET | Freq: Every day | ORAL | 11 refills | Status: DC

## 2017-02-05 MED ORDER — EZETIMIBE 10 MG PO TABS: 10.0000 mg | ORAL_TABLET | Freq: Every day | ORAL | 11 refills | Status: DC

## 2017-02-05 NOTE — Progress Notes (Signed)
Patient ambulated independently in hall with NT. Slow steady gait, tolerated well with no complaints of shortness of breath, pain or claudication.

## 2017-02-05 NOTE — Discharge Summary (Addendum)
Discharge Summary    Patient ID: Joshua Wyatt,  MRN: [id number], DOB/AGE: [birth year] 71 y.o.  Admit date: 02/03/2017 Discharge date: 02/05/2017  Primary Care Provider: [facility] Primary Cardiologist: Dr. Gwenlyn Wyatt  Discharge Diagnoses    Principal Problem:   Severe claudication [facility]) Active Problems:   PVD (peripheral vascular disease) ([place])   Mixed hyperlipidemia   Essential hypertension   Bilateral carotid artery disease ([facility])   PAD (peripheral artery disease) ([facility])   Hyponatremia   CKD (chronic kidney disease) stage 3, GFR 30-59 ml/min   Allergies Allergies  Allergen Reactions  . Statins Swelling    Muscle pain    Diagnostic Studies/Procedures    PV angiogram/Intervention 02/04/2017 1: Left lower extremity-the proximal and mid third of the left SFA had diffuse 80-90% segmental stenosis. The previously placed nylon stent in the adductor canal with occluded with 90-95% stenosis just above this. The left popliteal artery had diffuse 75% stenosis with 2 vessel runoff. The anterior tibial was occluded.  Successful angiosculpt cutting balloon atherectomy followed by drug-eluting balloon angioplasty of the distal left SFA and above-the-knee popliteal artery for lifestyle limiting claudication. The patient was already on dual antiplatelet therapy. The sheath will be removed once the eighth 2000 170 pressure held. The patient will be hydrated overnight and discharged home in the morning. We'll obtain outpatient lower extremity arterial Doppler studies in our [place] office next week and I will see him back 2-3 weeks thereafter.  _____________   History of Present Illness     71 y.o. male with a hx of PVOD status post bilateral carotid endarterectomies performed by Dr. Drucie Wyatt in 2007.  He has had bilateral SFA intervention as well as right renal artery stenting. His other problems include hypertension and hyperlipidemia. He has progressive lifestyle limiting  claudication. He is here for PV angiography and intervention.  Hospital Course     Consultants: None  PAD -PV angiogram showed 80-90% segmental stenosis in proximal and mid third of the left SFA. Dr. Gwenlyn Wyatt completed successful angiosculpt cutting balloon atherectomy followed by drug eluting balloon angioplasty of the distal SFA and above the knee popliteal artery as above without complications.  -Right groin without hematoma or bruit. -The plan is for follow up outpatient lower extremity arterial doppler studies in the [place] next week and office visit with Dr. Gwenlyn Wyatt in 2-3 weeks.  -Continue dual antiplatelet therapy with Plavix and aspirin  CKD stage 3 -SCr 1.41 -->1.29 today -Micardis on hold, restart today.   Hypertension -Elevated BP's, improved over night.  Micardis is on hold to reduce impact of dye on kidney. Hydralazine prn is ordered. Can restart Micardis today.  -Continue cardizem  HLD (hyperlipidemia) -Intolerance/allergy to statins - per office note, pt states his mouth became swollen along with leg pain. -TG 474, Tchol 331, HDL 35. Will add Zetia and fenofibrate. -Will need follow up lipid panel in 2-3 months -PCSK9 inhibitor therapy was discussed and he was worried about side effects. Consider having pt seen at lipid clinic.  Patient has been seen by Dr. Claiborne Wyatt today and deemed ready for discharge home. All follow up appointments have been scheduled. Discharge medications are listed below. ___________  Discharge Vitals Blood pressure (!) 147/51, pulse 66, temperature 98.4 F (36.9 C), temperature source Oral, resp. rate 16, height $RemoveBe'5\' 7"'UBznSaUNe$  (1.702 m), weight 156 lb 8.4 oz (71 kg), SpO2 95 %.  Filed Weights   02/03/17 1306 02/04/17 0444 02/05/17 0540  Weight: 159 lb 9.8 oz (72.4  kg) 153 lb 8 oz (69.6 kg) 156 lb 8.4 oz (71 kg)   Physical Exam  Constitutional: He is oriented to person, place, and time. He appears well-developed and well-nourished.  HENT:    Head: Normocephalic and atraumatic.  Neck: Normal range of motion. Neck supple.  Cardiovascular: Normal rate, regular rhythm and normal heart sounds.   Pulses:      Dorsalis pedis pulses are 1+ on the right side, and 1+ on the left side.       Posterior tibial pulses are 1+ on the right side, and 1+ on the left side.  Right groin site without hematoma or bruit  Pulmonary/Chest: Effort normal and breath sounds normal.  Abdominal: Soft. Bowel sounds are normal.  Musculoskeletal: Normal range of motion.  Neurological: He is alert and oriented to person, place, and time.  Skin: Skin is warm and dry.   Labs & Radiologic Studies    CBC  Recent Labs  02/04/17 0156 02/05/17 0448  WBC 7.6 7.1  HGB 10.7* 10.7*  HCT 30.9* 31.8*  MCV 82.6 84.4  PLT 205 765   Basic Metabolic Panel  Recent Labs  02/04/17 0156 02/05/17 0448  NA 124* 129*  K 4.4 4.6  CL 94* 97*  CO2 22 23  GLUCOSE 94 98  BUN 14 16  CREATININE 1.41* 1.29*  CALCIUM 10.0 10.3   Liver Function Tests  Recent Labs  02/04/17 0156  AST 20  ALT 13*  ALKPHOS 141*  BILITOT 0.6  PROT 6.8  ALBUMIN 3.7   No results for input(s): LIPASE, AMYLASE in the last 72 hours. Cardiac Enzymes No results for input(s): CKTOTAL, CKMB, CKMBINDEX, TROPONINI in the last 72 hours. BNP Invalid input(s): POCBNP D-Dimer No results for input(s): DDIMER in the last 72 hours. Hemoglobin A1C No results for input(s): HGBA1C in the last 72 hours. Fasting Lipid Panel No results for input(s): CHOL, HDL, LDLCALC, TRIG, CHOLHDL, LDLDIRECT in the last 72 hours. Thyroid Function Tests No results for input(s): TSH, T4TOTAL, T3FREE, THYROIDAB in the last 72 hours.  Invalid input(s): FREET3 _____________  Dg Chest 2 View  Result Date: 01/24/2017 CLINICAL DATA:  Claudication. EXAM: CHEST  2 VIEW COMPARISON:  10/05/2014. FINDINGS: Mediastinum hilar structures normal. Mild left base subsegmental atelectasis. No pleural effusion or pneumothorax.  Heart size normal. Degenerative changes thoracic spine . IMPRESSION: Mild left base subsegmental atelectasis and/or scarring. Electronically Signed   By: Joshua Wyatt  Register   On: 01/24/2017 09:34   Disposition   Pt is being discharged home today in good condition.  Follow-up Plans & Appointments    Follow-up Information    [name], MD Follow up on 02/26/2017.   Specialties:  Cardiology, Radiology Why:  at 10:30 for hospital follow up after peripheral vascular intervention.  You will need left leg arterial doppler test next week. Our office will call you to schedule.  Contact information: [address] [place] [address] [phone]          Discharge Instructions    Diet - low sodium heart healthy    Complete by:  As directed    Discharge instructions    Complete by:  As directed    PLEASE REMEMBER TO BRING ALL OF YOUR MEDICATIONS TO EACH OF YOUR FOLLOW-UP OFFICE VISITS.  PLEASE ATTEND ALL SCHEDULED FOLLOW-UP APPOINTMENTS.   Activity: Increase activity slowly as tolerated. You may shower, but no soaking baths (or swimming) for 1 week. No driving for 24 hours. No lifting over 5  lbs for 1 week. No sexual activity for 1 week.   You May Return to Work: in 1 week (if applicable)  Wound Care: You may wash cath site gently with soap and water. Keep cath site clean and dry. If you notice pain, swelling, bleeding or pus at your cath site, please call [phone].   Increase activity slowly    Complete by:  As directed       Discharge Medications   Current Discharge Medication List    START taking these medications   Details  ezetimibe (ZETIA) 10 MG tablet Take 1 tablet (10 mg total) by mouth daily. Qty: 30 tablet, Refills: 11    fenofibrate (TRICOR) 145 MG tablet Take 1 tablet (145 mg total) by mouth daily. Qty: 30 tablet, Refills: 11    pantoprazole (PROTONIX) 40 MG tablet Take 1 tablet (40 mg total) by mouth daily. Qty: 30 tablet, Refills: 5       CONTINUE these medications which have NOT CHANGED   Details  acetaminophen (TYLENOL) 500 MG tablet Take 500 mg by mouth every 6 (six) hours as needed for moderate pain or headache.    aspirin 81 MG tablet Take 81 mg by mouth daily.    clopidogrel (PLAVIX) 75 MG tablet Take 1 tablet (75 mg total) by mouth daily. Qty: 30 tablet, Refills: 5    diltiazem (DILACOR XR) 240 MG 24 hr capsule Take 1 capsule (240 mg total) by mouth daily. Qty: 90 capsule, Refills: 3    potassium chloride SA (K-DUR,KLOR-CON) 20 MEQ tablet Take 1 tablet (20 mEq total) by mouth daily. Qty: 30 tablet, Refills: 3    telmisartan (MICARDIS) 80 MG tablet Take 80 mg by mouth daily.       STOP taking these medications     omeprazole (PRILOSEC OTC) 20 MG tablet            Outstanding Labs/Studies   None  Duration of Discharge Encounter   Greater than 30 minutes including physician time.  Signed, Daune Perch NP 02/05/2017, 11:32 AM    Patient seen and examined. Agree with assessment and plan. Feels well; R groin site stable.  Will add zetia 10 mg, fenofibrate 145 mg and omega 3 FFA for his mixed hyperlipidemia;  Consider PCSK9 inhibition if Chol remains increased.  DC today. F/U Dr. Gwenlyn Wyatt.   Troy Sine, MD, [facility] 02/05/2017 11:32 AM

## 2017-02-05 NOTE — Care Management Note (Signed)
Case Management Note  Patient Details  Name: Joshua Wyatt MRN: [id number] Date of Birth: [birth year]  Subjective/Objective:   S/p pv intervention, will be on plavix and asa, for dc today, no needs.                 Action/Plan:   Expected Discharge Date:  02/05/17               Expected Discharge Plan:  Home/Self Care  In-House Referral:     Discharge planning Services  CM Consult  Post Acute Care Choice:    Choice offered to:     DME Arranged:    DME Agency:     HH Arranged:    HH Agency:     Status of Service:  Completed, signed off  If discussed at [employer] of Stay Meetings, dates discussed:    Additional Comments:  Zenon Mayo, RN 02/05/2017, 2:30 PM

## 2017-02-06 NOTE — Consult Note (Signed)
Joshua Wyatt, BORGWARDT                 ACCOUNT NO.:  [id number]  MEDICAL RECORD NO.:  [id number]  LOCATION:  [facility]                         FACILITY:  [facility]  PHYSICIAN:  Malcolm Quast K. Merisa [name], M.D.DATE OF BIRTH:  [birth year]  DATE OF CONSULTATION: DATE OF DISCHARGE:                                CONSULTATION   CLINICAL HISTORY:  The patient with right carotid stenosis.  EXAMINATION:  Intracranial interpretation of right common carotid artery, left common carotid artery, arteriograms, pre and post stent assisted angioplasty of the right internal carotid artery proximally.  FINDINGS:  The pre angioplasty and stenting common carotid arteriogram on the right demonstrates wide patency of the right internal carotid artery in the distal cervical petrous __________.  There is mild stenosis involving the proximal cavernous segment.  More distally, the supraclinoid segment is widely patent.  Opacification of the right middle cerebral artery is noted into the capillary and venous phases.  __________ unopacified blood is seen in the proximal middle cerebral artery probably from the contralateral left ICA via the anterior communicating artery.  The left common carotid arteriogram demonstrates a proximal 50% stenosis of the left internal carotid artery in the distal cervical segment.  The petrous, the cavernous, and the proximal supraclinoid segments are widely patent.  There is a tapered narrowing of approximately 50% involving the distal left internal carotid artery supraclinoid segment.  There is a focal outpouching noted at the left internal carotid artery terminus just proximal to the origin of the left middle cerebral artery.  The left middle cerebral artery proximal demonstrates a mild diffuse narrowing in the M1 segment.  The distal branches appear to opacify normally into the capillary and venous phases.  The left anterior cerebral artery also opacifies normally into the capillary  and venous phases.  Prompt opacification via the anterior communicating artery of the right anterior cerebral artery distribution is noted.  Also noted is opacification of the right anterior cerebral artery A-1 segment and subsequently the right middle cerebral artery with mixing up unopacified blood from the ipsilateral right internal carotid artery as mentioned above.  The post angioplasty and stenting right common carotid arteriogram demonstrates brisk opacification of the right internal carotid artery in the petrous, cavernous, and supraclinoid segments.  __________ mild stenosis of the proximal cavernous segment of the right internal carotid artery.  Intracranially, there is brisk opacification of the right middle cerebral artery distribution and also now the anterior cerebral artery into the capillary and venous phases.  IMPRESSION:  Improved hemodynamic flow of contrast into the right internal carotid artery intracranially and right middle and right anterior cerebral artery following stent assisted angioplasty.  No gross filling defects or abnormal filling defects are noted.  Approximately 50% stenosis of the left internal carotid artery in the distal cervical segment.  Focal outpouching at the junction of the left internal carotid terminus with the left middle cerebral artery.  This raises the suspicion of a small approximately 2 mm to 2.5 mm aneurysm.  Pre angioplasty stenting arteriogram of the left common carotid artery demonstrates brisk opacification of the right anterior circulation via the anterior communicating artery as described above.  ______________________________ Fritz Pickerel Estanislado Pandy, M.D.     SKD/MEDQ  D:  02/05/2017  T:  02/05/2017  Job:  [id number]

## 2017-02-07 ENCOUNTER — Other Ambulatory Visit: Payer: Self-pay | Admitting: Cardiovascular Disease

## 2017-02-07 DIAGNOSIS — I739 Peripheral vascular disease, unspecified: Principal | ICD-10-CM

## 2017-02-07 DIAGNOSIS — I779 Disorder of arteries and arterioles, unspecified: Secondary | ICD-10-CM

## 2017-02-08 ENCOUNTER — Other Ambulatory Visit: Payer: Self-pay | Admitting: Cardiovascular Disease

## 2017-02-11 ENCOUNTER — Other Ambulatory Visit: Payer: Self-pay | Admitting: Cardiovascular Disease

## 2017-02-11 NOTE — Telephone Encounter (Signed)
New Message    Pt c/o medication issue:  1. Name of Medication: fenofibrate (TRICOR) 145 MG tablet  2. How are you currently taking this medication (dosage and times per day)? 1 a day  3. Are you having a reaction (difficulty breathing--STAT)? No  4. What is your medication issue? Per pt can't afford medication, and wants to know if he can be prescribed something that he can afford. Requesting call back

## 2017-02-11 NOTE — Telephone Encounter (Signed)
New Message     *STAT* If patient is at the pharmacy, call can be transferred to refill team.   1. Which medications need to be refilled? (please list name of each medication and dose if known) Plavix  2. Which pharmacy/location (including street and city if local pharmacy) is medication to be sent to? [facility]  [address]   3. Do they need a 30 day or 90 day supply?[place]

## 2017-02-11 NOTE — Telephone Encounter (Signed)
Medication Detail    Disp Refills Start End   clopidogrel (PLAVIX) 75 MG tablet 30 tablet 10 02/11/2017    Sig: TAKE ONE TABLET BY MOUTH ONCE DAILY   Notes to Pharmacy: Please consider 90 day supplies to promote better adherence   E-Prescribing Status: Receipt confirmed by pharmacy (02/11/2017 2:04 PM EST)   Pharmacy   [place], [place] - [address]

## 2017-02-20 ENCOUNTER — Ambulatory Visit (HOSPITAL_COMMUNITY)
Admission: RE | Admit: 2017-02-20 | Discharge: 2017-02-20 | Disposition: A | Discharge status: Home / Self Care | Payer: Medicare Other | Source: Ambulatory Visit | Attending: Cardiology | Admitting: Cardiology

## 2017-02-20 DIAGNOSIS — Z87891 Personal history of nicotine dependence: Secondary | ICD-10-CM | POA: Insufficient documentation

## 2017-02-20 DIAGNOSIS — I10 Essential (primary) hypertension: Secondary | ICD-10-CM | POA: Diagnosis not present

## 2017-02-20 DIAGNOSIS — R938 Abnormal findings on diagnostic imaging of other specified body structures: Secondary | ICD-10-CM | POA: Diagnosis not present

## 2017-02-20 DIAGNOSIS — E785 Hyperlipidemia, unspecified: Secondary | ICD-10-CM | POA: Insufficient documentation

## 2017-02-20 DIAGNOSIS — I2510 Atherosclerotic heart disease of native coronary artery without angina pectoris: Secondary | ICD-10-CM | POA: Insufficient documentation

## 2017-02-20 DIAGNOSIS — I739 Peripheral vascular disease, unspecified: Secondary | ICD-10-CM

## 2017-02-25 ENCOUNTER — Other Ambulatory Visit: Payer: Self-pay | Admitting: Cardiovascular Disease

## 2017-02-25 ENCOUNTER — Telehealth: Payer: Self-pay | Admitting: Cardiovascular Disease

## 2017-02-25 DIAGNOSIS — I739 Peripheral vascular disease, unspecified: Secondary | ICD-10-CM

## 2017-02-25 NOTE — Telephone Encounter (Signed)
VAS [place] LOWER EXTREMITY ARTERIAL DUPLEX  Order: [id number]  Status:  Final result Visible to patient:  No (Not Released) Dx:  PAD (peripheral artery disease) ([place])  Notes recorded by Therisa Doyne on 02/25/2017 at 2:39 PM EDT Results given to pt. Pt verbalized understanding. Repeat order entered.  ------  Notes recorded by Lorretta Harp, MD on 02/22/2017 at 2:01 PM EDT Improved LABI s/p intervention. Repeat 6 months

## 2017-02-26 ENCOUNTER — Encounter: Payer: Self-pay | Admitting: Cardiovascular Disease

## 2017-02-26 ENCOUNTER — Ambulatory Visit (INDEPENDENT_AMBULATORY_CARE_PROVIDER_SITE_OTHER): Payer: Medicare Other | Admitting: Cardiovascular Disease

## 2017-02-26 ENCOUNTER — Ambulatory Visit (HOSPITAL_COMMUNITY)
Admission: RE | Admit: 2017-02-26 | Discharge: 2017-02-26 | Disposition: A | Discharge status: Home / Self Care | Payer: Medicare Other | Source: Ambulatory Visit | Attending: Cardiovascular Disease | Admitting: Cardiovascular Disease

## 2017-02-26 VITALS — BP 134/71 | HR 62 | Ht 67.5 in | Wt 154.8 lb

## 2017-02-26 DIAGNOSIS — Z87891 Personal history of nicotine dependence: Secondary | ICD-10-CM | POA: Diagnosis not present

## 2017-02-26 DIAGNOSIS — E785 Hyperlipidemia, unspecified: Secondary | ICD-10-CM | POA: Insufficient documentation

## 2017-02-26 DIAGNOSIS — I739 Peripheral vascular disease, unspecified: Secondary | ICD-10-CM

## 2017-02-26 DIAGNOSIS — I6523 Occlusion and stenosis of bilateral carotid arteries: Secondary | ICD-10-CM | POA: Diagnosis not present

## 2017-02-26 DIAGNOSIS — E782 Mixed hyperlipidemia: Secondary | ICD-10-CM | POA: Diagnosis not present

## 2017-02-26 DIAGNOSIS — I779 Disorder of arteries and arterioles, unspecified: Secondary | ICD-10-CM

## 2017-02-26 DIAGNOSIS — I2510 Atherosclerotic heart disease of native coronary artery without angina pectoris: Secondary | ICD-10-CM | POA: Insufficient documentation

## 2017-02-26 DIAGNOSIS — I10 Essential (primary) hypertension: Secondary | ICD-10-CM | POA: Insufficient documentation

## 2017-02-26 NOTE — Progress Notes (Signed)
02/26/2017 ROSSI BURDO   01-Jan-1946  [id number]  Primary Physician Thressa Sheller, MD Primary Cardiologist: Lorretta Harp MD Renae Gloss  HPI:  The patient is a 71 year old thin appearing married Caucasian male with no children who I last saw in the office 01/23/17.[name] He has a history of PVOD status post bilateral carotid endarterectomies performed by Dr. Drucie Opitz in 2007. We have been following him by duplex ultrasound which performed most recently several days ago revealed this to be widely patent. He has also had bilateral SFA intervention as well as right renal artery stenting. His other problems include hypertension and hyperlipidemia. He did have an episode of chest heaviness recently, but more importantly he has complained of progressive lifestyle limiting claudication since I last saw him. Most recent lower extremity Dopplers reveal a right ABI of 0.93 and a left of 0.82. He does appear to have moderate iliac disease as well as high grade right SFA and popliteal stenosis. He underwent abdominal aortography with bifemoral runoff on 03/30/13 revealed a widely patent right renal artery stent, 50-60% proximal left renal artery stenosis. The left SFA was while he stayed patent and he had a 95% focal above-the-knee popliteal artery stenosis with three-vessel runoff. There was a 60% focal lesion in the right common iliac artery and a 50% segmental proximal to mid right SFA stenosis with a patent stent in the mid to distal right SFA. He also had a 50% right popliteal stenosis with three-vessel runoff. He underwent cutting balloon angioplasty of the left above-the-knee popliteal artery and Angiosculpt balloon with an excellent angiographic result and was discharged the following day. I performed angiography on him 10/11/14 and recanalized a subtotally occluded right SFA with overlapping via Bahn covered stent performed intervention on his above-the-knee popliteal artery on that side. He  had a patent mid to distal left SFA stent with high grade segmental and 6 sequential disease in the proximal SFA and distal SFA on the left. Since I saw him a year and a half ago he's had progressive lifestyle limiting claudication in his left calf. Addition, he's had severe progression of disease in his right internal carotid artery. I performed carotid stenting on him 01/03/17 with an excellent result. He underwent left lower extremity angiography by myself 02/03/17 revealing an occluded distal left SFA stent with otherwise high-grade segmental disease proximal to this and occluded anterior tibial. I performed cutting balloon atherectomy of his in-stent restenosis and the surrounding disease excellent angiographic result. His claudication has completely resolved. His left ABI has improved from 0.59 up to 0.81.  Current Outpatient Prescriptions  Medication Sig Dispense Refill  . aspirin 81 MG tablet Take 81 mg by mouth daily.    . clopidogrel (PLAVIX) 75 MG tablet TAKE ONE TABLET BY MOUTH ONCE DAILY 30 tablet 10  . diltiazem (DILACOR XR) 240 MG 24 hr capsule Take 1 capsule (240 mg total) by mouth daily. 90 capsule 3  . ezetimibe (ZETIA) 10 MG tablet Take 1 tablet (10 mg total) by mouth daily. 30 tablet 11  . pantoprazole (PROTONIX) 40 MG tablet Take 1 tablet (40 mg total) by mouth daily. 30 tablet 5   No current facility-administered medications for this visit.     Allergies  Allergen Reactions  . Statins Swelling    Muscle pain    Social History   Social History  . Marital status: Married    Spouse name: N/A  . Number of children: N/A  . Years of  education: N/A   Occupational History  . Not on file.   Social History Main Topics  . Smoking status: Former Smoker    Years: 58.00    Types: Cigars, Cigarettes    Quit date: 10/18/2013  . Smokeless tobacco: Never Used  . Alcohol use No  . Drug use: No  . Sexual activity: Yes     Comment: RARE  CIGAR   Other Topics Concern  . Not on  file   Social History Narrative  . No narrative on file     Review of Systems: General: negative for chills, fever, night sweats or weight changes.  Cardiovascular: negative for chest pain, dyspnea on exertion, edema, orthopnea, palpitations, paroxysmal nocturnal dyspnea or shortness of breath Dermatological: negative for rash Respiratory: negative for cough or wheezing Urologic: negative for hematuria Abdominal: negative for nausea, vomiting, diarrhea, bright red blood per rectum, melena, or hematemesis Neurologic: negative for visual changes, syncope, or dizziness All other systems reviewed and are otherwise negative except as noted above.    Blood pressure 134/71, pulse 62, height 5' 7.5" (1.715 m), weight 154 lb 12.8 oz (70.2 kg), SpO2 99 %.  General appearance: alert and no distress Neck: no adenopathy, no carotid bruit, no JVD, supple, symmetrical, trachea midline and thyroid not enlarged, symmetric, no tenderness/mass/nodules Lungs: clear to auscultation bilaterally Heart: regular rate and rhythm, S1, S2 normal, no murmur, click, rub or gallop Extremities: extremities normal, atraumatic, no cyanosis or edema  EKG not performed today  ASSESSMENT AND PLAN:   Mixed hyperlipidemia History of hyperlipidemia on Zetia because of statin intolerance recently profile performed 12/12/16 revealing total cholesterol 331, LDL which could not be titrated nitroglycerin level of 474. He has changed his diet. We'll recheck a lipid and liver profile in 3 months. We will reassess pharmacologic regimen at that time.  Essential hypertension History of hypertension blood pressure measured to 134/71. He is on diltiazem. Continue current meds at current dosing  Bilateral carotid artery disease ([place]) History of carotid artery disease status post bilateral carotid endarterectomies performed by Dr. Erskin Burnet in 2007. He had right internal carotid artery stenting by myself 01/03/17 because of restenosis  within the endarterectomy site with an excellent into graphic result. He is scheduled for carotid Dopplers today  PAD (peripheral artery disease) ([place]) History of symptomatic PAC status post bilateral SFA intervention as well as right renal artery stenting in the past. He underwent angiography by myself 10/11/14 with recanalization of a subtotally occluded right SFA using overlapping Viabahn  and covered stents. Because of left lower extremity was tolerating claudication I angiogram him on 02/03/17 revealing an occluded distal left SFA stent with 2 vessel runoff in the occluded anterior tibial. He did have high-grade segmental 80% proximal mid left SFA stenosis as well. I performed an intervention of his occluded stent reducing a total occlusion to 0% residual. Follow-up Dopplers showed improvement in his left ABI from 0.59 up to 0.81. His left leg; claudication has resolved.      Lorretta Harp MD FACP,FACC,FAHA, [facility] 02/26/2017 10:53 AM

## 2017-02-26 NOTE — Assessment & Plan Note (Addendum)
History of carotid artery disease status post bilateral carotid endarterectomies performed by Dr. Erskin Burnet in 2007. He had right internal carotid artery stenting by myself 01/03/17 because of restenosis within the endarterectomy site with an excellent into graphic result. He is scheduled for carotid Dopplers today

## 2017-02-26 NOTE — Patient Instructions (Signed)
Medication Instructions: Your physician recommends that you continue on your current medications as directed. Please refer to the Current Medication list given to you today.    Labwork: Your physician recommends that you return for a FASTING lipid profile and hepatic function panel in 3 months.   Testing/Procedures:  Schedule LEA/ABI dopplers for September 2018. Your physician has requested that you have a lower extremity arterial duplex. During this test, ultrasound is used to evaluate arterial blood flow in the legs. Allow one hour for this exam. There are no restrictions or special instructions.  Your physician has requested that you have an ankle brachial index (ABI). During this test an ultrasound and blood pressure cuff are used to evaluate the arteries that supply the arms and legs with blood. Allow thirty minutes for this exam. There are no restrictions or special instructions.   Your physician has requested that you have a carotid duplex in 6 months. This test is an ultrasound of the carotid arteries in your neck. It looks at blood flow through these arteries that supply the brain with blood. Allow one hour for this exam. There are no restrictions or special instructions.  Follow-Up: Your physician wants you to follow-up in: 6 months with Dr. Gwenlyn Found. You will receive a reminder letter in the mail two months in advance. If you don't receive a letter, please call our office to schedule the follow-up appointment.  If you need a refill on your cardiac medications before your next appointment, please call your pharmacy.

## 2017-02-26 NOTE — Assessment & Plan Note (Signed)
History of hypertension blood pressure measured to 134/71. He is on diltiazem. Continue current meds at current dosing

## 2017-02-26 NOTE — Assessment & Plan Note (Signed)
History of symptomatic PAC status post bilateral SFA intervention as well as right renal artery stenting in the past. He underwent angiography by myself 10/11/14 with recanalization of a subtotally occluded right SFA using overlapping Viabahn  and covered stents. Because of left lower extremity was tolerating claudication I angiogram him on 02/03/17 revealing an occluded distal left SFA stent with 2 vessel runoff in the occluded anterior tibial. He did have high-grade segmental 80% proximal mid left SFA stenosis as well. I performed an intervention of his occluded stent reducing a total occlusion to 0% residual. Follow-up Dopplers showed improvement in his left ABI from 0.59 up to 0.81. His left leg; claudication has resolved.

## 2017-02-26 NOTE — Assessment & Plan Note (Signed)
History of hyperlipidemia on Zetia because of statin intolerance recently profile performed 12/12/16 revealing total cholesterol 331, LDL which could not be titrated nitroglycerin level of 474. He has changed his diet. We'll recheck a lipid and liver profile in 3 months. We will reassess pharmacologic regimen at that time.

## 2017-03-06 ENCOUNTER — Telehealth: Payer: Self-pay | Admitting: Cardiovascular Disease

## 2017-03-06 ENCOUNTER — Other Ambulatory Visit: Payer: Self-pay | Admitting: Cardiovascular Disease

## 2017-03-06 DIAGNOSIS — I739 Peripheral vascular disease, unspecified: Principal | ICD-10-CM

## 2017-03-06 DIAGNOSIS — I779 Disorder of arteries and arterioles, unspecified: Secondary | ICD-10-CM

## 2017-03-06 NOTE — Telephone Encounter (Signed)
VAS US CAROTID  Order: [id number]  Status:  Final result Visible to patient:  No (Not Released) Dx:  Bilateral carotid artery disease ([place])  Notes recorded by Therisa Doyne on 03/06/2017 at 11:03 AM EDT Results given to pt. Pt verbalized understanding. Repeat order entered.  ------  Notes recorded by Lorretta Harp, MD on 02/28/2017 at 2:17 PM EDT Widely patent carotid stent and left carotid endarterectomy site. Repeat 12 months

## 2017-08-07 ENCOUNTER — Other Ambulatory Visit: Payer: Self-pay

## 2017-08-07 MED ORDER — PANTOPRAZOLE SODIUM 40 MG PO TBEC: 40.0000 mg | DELAYED_RELEASE_TABLET | Freq: Every day | ORAL | 0 refills | Status: DC

## 2017-08-29 ENCOUNTER — Encounter (HOSPITAL_COMMUNITY): Payer: Self-pay

## 2017-09-02 ENCOUNTER — Other Ambulatory Visit: Payer: Self-pay | Admitting: Cardiovascular Disease

## 2017-09-02 MED ORDER — DILTIAZEM HCL ER 240 MG PO CP24: 240.0000 mg | ORAL_CAPSULE | Freq: Every day | ORAL | 1 refills | Status: DC

## 2017-09-02 MED ORDER — PANTOPRAZOLE SODIUM 40 MG PO TBEC: 40.0000 mg | DELAYED_RELEASE_TABLET | Freq: Every day | ORAL | 1 refills | Status: DC

## 2017-09-02 NOTE — Telephone Encounter (Signed)
Rx(s) sent to pharmacy electronically.  

## 2017-09-02 NOTE — Telephone Encounter (Signed)
New message    *STAT* If patient is at the pharmacy, call can be transferred to refill team.   1. Which medications need to be refilled? (please list name of each medication and dose if known) pantoprazole (PROTONIX) 40 MG tablet diltiazem (DILACOR XR) 240 MG 24 hr capsule  2. Which pharmacy/location (including street and city if local pharmacy) is medication to be sent to? [facility] on [address]   3. Do they need a 30 day or 90 day supply? 30 days

## 2017-09-03 ENCOUNTER — Ambulatory Visit (HOSPITAL_COMMUNITY)
Admission: RE | Admit: 2017-09-03 | Payer: Medicare Other | Source: Ambulatory Visit | Attending: Cardiovascular Disease | Admitting: Cardiovascular Disease

## 2017-09-03 ENCOUNTER — Ambulatory Visit (HOSPITAL_COMMUNITY): Payer: Medicare Other

## 2017-09-20 ENCOUNTER — Ambulatory Visit (HOSPITAL_COMMUNITY)
Admission: RE | Admit: 2017-09-20 | Discharge: 2017-09-20 | Disposition: A | Discharge status: Home / Self Care | Payer: Medicare Other | Source: Ambulatory Visit | Attending: Cardiology | Admitting: Cardiology

## 2017-09-20 ENCOUNTER — Ambulatory Visit (HOSPITAL_COMMUNITY)
Admission: RE | Admit: 2017-09-20 | Payer: Medicare Other | Source: Ambulatory Visit | Attending: Cardiovascular Disease | Admitting: Cardiovascular Disease

## 2017-09-20 ENCOUNTER — Encounter (HOSPITAL_COMMUNITY): Payer: Medicare Other

## 2017-09-20 DIAGNOSIS — R9389 Abnormal findings on diagnostic imaging of other specified body structures: Secondary | ICD-10-CM | POA: Insufficient documentation

## 2017-09-20 DIAGNOSIS — I739 Peripheral vascular disease, unspecified: Secondary | ICD-10-CM

## 2017-09-30 DIAGNOSIS — H18413 Arcus senilis, bilateral: Secondary | ICD-10-CM | POA: Diagnosis not present

## 2017-09-30 DIAGNOSIS — H52223 Regular astigmatism, bilateral: Secondary | ICD-10-CM | POA: Diagnosis not present

## 2017-09-30 DIAGNOSIS — H5203 Hypermetropia, bilateral: Secondary | ICD-10-CM | POA: Diagnosis not present

## 2017-09-30 DIAGNOSIS — H524 Presbyopia: Secondary | ICD-10-CM | POA: Diagnosis not present

## 2017-10-09 ENCOUNTER — Encounter: Payer: Self-pay | Admitting: Cardiovascular Disease

## 2017-10-09 ENCOUNTER — Ambulatory Visit (INDEPENDENT_AMBULATORY_CARE_PROVIDER_SITE_OTHER): Payer: Medicare Other | Admitting: Cardiovascular Disease

## 2017-10-09 VITALS — BP 170/82 | HR 52 | Ht 67.5 in | Wt 161.0 lb

## 2017-10-09 DIAGNOSIS — E782 Mixed hyperlipidemia: Secondary | ICD-10-CM | POA: Diagnosis not present

## 2017-10-09 DIAGNOSIS — I6523 Occlusion and stenosis of bilateral carotid arteries: Secondary | ICD-10-CM | POA: Diagnosis not present

## 2017-10-09 DIAGNOSIS — I739 Peripheral vascular disease, unspecified: Secondary | ICD-10-CM

## 2017-10-09 DIAGNOSIS — I10 Essential (primary) hypertension: Secondary | ICD-10-CM

## 2017-10-09 NOTE — Assessment & Plan Note (Signed)
History of peripheral arterial disease status post left SFA intervention in the past. He's had multiple other interventions including right renal artery stenting and bilateral carotid endarterectomies. Because of claudication and worsening Dopplers I angiogram him 02/03/17 revealing an occluded distal left SFA stent which I intervened on with an excellent result. He did have high-grade disease in his proximal and mid left SFA just proximal to the stented segment. His ABIs improved and his claudication resolved although recent ABIs did show a high frequency signal in his mid left SFA despite the absence of claudication. He is on dual antiplatelet therapy. We'll continue continue to follow him noninvasively.

## 2017-10-09 NOTE — Assessment & Plan Note (Signed)
History of essential hypertension blood pressure measured 170/82. He is on diltiazem. He is under a lot of stress at home because of his sick wife. We will continue to follow this.

## 2017-10-09 NOTE — Assessment & Plan Note (Signed)
History of carotid artery disease status post bilateral carotid endarterectomies performed by Dr. Amedeo Plenty in 2007. I performed right internal carotid artery stenting on him 01/03/17 because of restenosis. Follow-up carotid Dopplers revealed this to be widely patent.

## 2017-10-09 NOTE — Patient Instructions (Signed)
Medication Instructions: Your physician recommends that you continue on your current medications as directed. Please refer to the Current Medication list given to you today.   Labwork: Your physician recommends that you return for a FASTING lipid profile and hepatic function panel.   Testing/Procedures: Your physician has requested that you have a lower extremity arterial duplex. During this test, ultrasound is used to evaluate arterial blood flow in the legs. Allow one hour for this exam. There are no restrictions or special instructions.  Your physician has requested that you have an ankle brachial index (ABI). During this test an ultrasound and blood pressure cuff are used to evaluate the arteries that supply the arms and legs with blood. Allow thirty minutes for this exam. There are no restrictions or special instructions.  Your physician has requested that you have a carotid duplex. This test is an ultrasound of the carotid arteries in your neck. It looks at blood flow through these arteries that supply the brain with blood. Allow one hour for this exam. There are no restrictions or special instructions. IN 6 MONTHS--PRIOR TO APPT WITH DR. Gwenlyn Found.  Follow-Up: Your physician recommends that you schedule an appointment with Dr. Mali Hilty for Lipid Management.  Your physician wants you to follow-up in: 6 months with Dr. Gwenlyn Found after dopplers. You will receive a reminder letter in the mail two months in advance. If you don't receive a letter, please call our office to schedule the follow-up appointment.     If you need a refill on your cardiac medications before your next appointment, please call your pharmacy.

## 2017-10-09 NOTE — Progress Notes (Signed)
10/09/2017 Joshua Wyatt   05-14-1946  [id number]  Primary Physician Thressa Sheller, MD Primary Cardiologist: Lorretta Harp MD FACP, [place], [place], [place]  HPI:  Joshua Wyatt is a 71 y.o. male thin appearing married Caucasian male with no children who I last saw in the office 02/26/17.[name] He has a history of PVOD status post bilateral carotid endarterectomies performed by Dr. Drucie Opitz in 2007. We have been following him by duplex ultrasound which performed most recently several days ago revealed this to be widely patent. He has also had bilateral SFA intervention as well as right renal artery stenting. His other problems include hypertension and hyperlipidemia. He did have an episode of chest heaviness recently, but more importantly he has complained of progressive lifestyle limiting claudication since I last saw him. Most recent lower extremity Dopplers reveal a right ABI of 0.93 and a left of 0.82. He does appear to have moderate iliac disease as well as high grade right SFA and popliteal stenosis. He underwent abdominal aortography with bifemoral runoff on 03/30/13 revealed a widely patent right renal artery stent, 50-60% proximal left renal artery stenosis. The left SFA was while he stayed patent and he had a 95% focal above-the-knee popliteal artery stenosis with three-vessel runoff. There was a 60% focal lesion in the right common iliac artery and a 50% segmental proximal to mid right SFA stenosis with a patent stent in the mid to distal right SFA. He also had a 50% right popliteal stenosis with three-vessel runoff. He underwent cutting balloon angioplasty of the left above-the-knee popliteal artery and Angiosculpt balloon with an excellent angiographic result and was discharged the following day. I performed angiography on him 10/11/14 and recanalized a subtotally occluded right SFA with overlapping via Bahn covered stent performed intervention on his above-the-knee popliteal artery on that  side. He had a patent mid to distal left SFA stent with high grade segmental and 6 sequential disease in the proximal SFA and distal SFA on the left. Since I saw him a year and a half ago he's had progressive lifestyle limiting claudication in his left calf. Addition, he's had severe progression of disease in his right internal carotid artery. I performed carotid stenting on him 01/03/17 with an excellent result. He underwent left lower extremity angiography by myself 02/03/17 revealing an occluded distal left SFA stent with otherwise high-grade segmental disease proximal to this and occluded anterior tibial. I performed cutting balloon atherectomy of his in-stent restenosis and the surrounding disease excellent angiographic result. His claudication has completely resolved. His left ABI has improved from 0.59 up to 0.  Since I saw him in the office 6 months ago he continues to deny claudication. Denies chest pain or shortness of breath. This Dopplers show high frequency signal in his mid left SFA suggesting progression of disease and/or in-stent restenosis. He does have significantly elevated lipid suggesting a familial hyperlipidemia and patient is a refer him to Dr. Debara Pickett because of his statin intolerance.   Current Meds  Medication Sig  . aspirin 81 MG tablet Take 81 mg by mouth daily.  . clopidogrel (PLAVIX) 75 MG tablet TAKE ONE TABLET BY MOUTH ONCE DAILY  . diltiazem (DILACOR XR) 240 MG 24 hr capsule Take 1 capsule (240 mg total) by mouth daily.  [name] ezetimibe (ZETIA) 10 MG tablet Take 1 tablet (10 mg total) by mouth daily.  . pantoprazole (PROTONIX) 40 MG tablet Take 1 tablet (40 mg total) by mouth daily.  Allergies  Allergen Reactions  . Statins Swelling    Muscle pain    Social History   Social History  . Marital status: Married    Spouse name: N/A  . Number of children: N/A  . Years of education: N/A   Occupational History  . Not on file.   Social History Main Topics  .  Smoking status: Former Smoker    Years: 58.00    Types: Cigars, Cigarettes    Quit date: 10/18/2013  . Smokeless tobacco: Never Used  . Alcohol use No  . Drug use: No  . Sexual activity: Yes     Comment: RARE  CIGAR   Other Topics Concern  . Not on file   Social History Narrative  . No narrative on file     Review of Systems: General: negative for chills, fever, night sweats or weight changes.  Cardiovascular: negative for chest pain, dyspnea on exertion, edema, orthopnea, palpitations, paroxysmal nocturnal dyspnea or shortness of breath Dermatological: negative for rash Respiratory: negative for cough or wheezing Urologic: negative for hematuria Abdominal: negative for nausea, vomiting, diarrhea, bright red blood per rectum, melena, or hematemesis Neurologic: negative for visual changes, syncope, or dizziness All other systems reviewed and are otherwise negative except as noted above.    Blood pressure (!) 170/82, pulse (!) 52, height 5' 7.5" (1.715 m), weight 161 lb (73 kg).  General appearance: alert and no distress Neck: no adenopathy, no JVD, supple, symmetrical, trachea midline, thyroid not enlarged, symmetric, no tenderness/mass/nodules and Soft bilateral carotid bruits Lungs: clear to auscultation bilaterally Heart: regular rate and rhythm, S1, S2 normal, no murmur, click, rub or gallop Extremities: extremities normal, atraumatic, no cyanosis or edema Pulses: 2+ and symmetric Skin: Skin color, texture, turgor normal. No rashes or lesions Neurologic: Alert and oriented X 3, normal strength and tone. Normal symmetric reflexes. Normal coordination and gait  EKG sinus bradycardia at 52 with LVH voltage. I personally reviewed this EKG.  ASSESSMENT AND PLAN:   PVD (peripheral vascular disease) ([place]) History of peripheral arterial disease status post left SFA intervention in the past. He's had multiple other interventions including right renal artery stenting and bilateral  carotid endarterectomies. Because of claudication and worsening Dopplers I angiogram him 02/03/17 revealing an occluded distal left SFA stent which I intervened on with an excellent result. He did have high-grade disease in his proximal and mid left SFA just proximal to the stented segment. His ABIs improved and his claudication resolved although recent ABIs did show a high frequency signal in his mid left SFA despite the absence of claudication. He is on dual antiplatelet therapy. We'll continue continue to follow him noninvasively.  Mixed hyperlipidemia History of hyperlipidemia on Zetia with lipid profile performed 12/12/16 revealing total cholesterol 331, tightness or level of 474. I suspect he has familial hyperlipidemia. He is statin intolerant. I'm going to recheck a lipid liver profile refer him to Dr. Debara Pickett in our lipid clinic for further evaluation. He may be a candidate for a PCS K9 monoclonal  Essential hypertension History of essential hypertension blood pressure measured 170/82. He is on diltiazem. He is under a lot of stress at home because of his sick wife. We will continue to follow this.  Bilateral carotid artery disease ([place]) History of carotid artery disease status post bilateral carotid endarterectomies performed by Dr. Amedeo Plenty in 2007. I performed right internal carotid artery stenting on him 01/03/17 because of restenosis. Follow-up carotid Dopplers revealed this to be widely patent.  Lorretta Harp MD FACP,FACC,FAHA, [facility] 10/09/2017 11:21 AM

## 2017-10-09 NOTE — Assessment & Plan Note (Signed)
History of hyperlipidemia on Zetia with lipid profile performed 12/12/16 revealing total cholesterol 331, tightness or level of 474. I suspect he has familial hyperlipidemia. He is statin intolerant. I'm going to recheck a lipid liver profile refer him to Dr. Debara Pickett in our lipid clinic for further evaluation. He may be a candidate for a PCS K9 monoclonal

## 2017-10-15 DIAGNOSIS — E782 Mixed hyperlipidemia: Secondary | ICD-10-CM | POA: Diagnosis not present

## 2017-10-15 LAB — LIPID PANEL
CHOL/HDL RATIO: 9.1 ratio — AB (ref 0.0–5.0)
Cholesterol, Total: 317 mg/dL — ABNORMAL HIGH (ref 100–199)
HDL: 35 mg/dL — AB (ref >39)
Triglycerides: 441 mg/dL — ABNORMAL HIGH (ref 0–149)

## 2017-10-15 LAB — HEPATIC FUNCTION PANEL
ALT: 7 IU/L (ref 0–44)
AST: 16 IU/L (ref 0–40)
Albumin: 4.4 g/dL (ref 3.5–4.8)
Alkaline Phosphatase: 202 IU/L — ABNORMAL HIGH (ref 39–117)
BILIRUBIN, DIRECT: 0.1 mg/dL (ref 0.00–0.40)
Bilirubin Total: 0.3 mg/dL (ref 0.0–1.2)
TOTAL PROTEIN: 7.2 g/dL (ref 6.0–8.5)

## 2017-10-29 ENCOUNTER — Ambulatory Visit (INDEPENDENT_AMBULATORY_CARE_PROVIDER_SITE_OTHER): Payer: Medicare Other | Admitting: Internal Medicine

## 2017-10-29 ENCOUNTER — Other Ambulatory Visit: Payer: Self-pay | Admitting: Cardiovascular Disease

## 2017-10-29 ENCOUNTER — Encounter: Payer: Self-pay | Admitting: Internal Medicine

## 2017-10-29 VITALS — BP 192/84 | HR 55 | Ht 67.0 in | Wt 164.2 lb

## 2017-10-29 DIAGNOSIS — I739 Peripheral vascular disease, unspecified: Secondary | ICD-10-CM | POA: Diagnosis not present

## 2017-10-29 DIAGNOSIS — Z79899 Other long term (current) drug therapy: Secondary | ICD-10-CM | POA: Diagnosis not present

## 2017-10-29 DIAGNOSIS — E782 Mixed hyperlipidemia: Secondary | ICD-10-CM | POA: Diagnosis not present

## 2017-10-29 DIAGNOSIS — I10 Essential (primary) hypertension: Secondary | ICD-10-CM

## 2017-10-29 MED ORDER — IRBESARTAN 150 MG PO TABS: 150.0000 mg | ORAL_TABLET | Freq: Every day | ORAL | 3 refills | Status: DC

## 2017-10-29 MED ORDER — ATORVASTATIN CALCIUM 40 MG PO TABS: 40.0000 mg | ORAL_TABLET | Freq: Every day | ORAL | 3 refills | Status: DC

## 2017-10-29 NOTE — Progress Notes (Signed)
OFFICE NOTE  Chief Complaint:  Elevated cholesterol  Primary Care Physician: Joshua Sheller, MD  HPI:  Joshua Wyatt is a 71 y.o. male patient of Dr. Gwenlyn Wyatt with a past medial history significant for PAD with prior angioplasty to the left SFA, renal artery disease with prior renal artery stenting, hypertension, dyslipidemia and cerebral atherosclerosis, who has a long-standing history of dyslipidemia.  He reported that he had been told he had an elevated cholesterol as early as his 65s.  He has a history of medication intolerances including the statins although says that he most recently has been on rosuvastatin and that caused some muscle soreness as well as perioral numbness and tingling.  He denies ever taking atorvastatin.  Currently he is on ezetimibe 10 mg, aspirin, clopidogrel, diltiazem and pantoprazole.  Blood pressure recently has been elevated and was noted at Dr. Kennon Wyatt recent visit.  It is again today elevated 192/84.  I rechecked that manually at the end of the visit and it was 190/80.  He felt that this could be related to stress taking care of his wife who is been hospitalized numerous times recently with pneumonia and has a feeding tube.  [name] recent lipid profile 2 weeks ago showed a total cholesterol 317, triglycerides 441, HDL 35 and LDL was not calculated.  He is not HDL cholesterol is 282.  Given evidence of ASCVD and long-standing dyslipidemia, it is likely he has familial hypercholesterolemia.  My suspicion is a Friedrickson type V dyslipidemia with high LDL and high triglycerides.  Of note, recently he has been having some tingling in his arms and feeling jittery, particularly in the morning, which may be related to elevated blood pressure.  PMHx:  Past Medical History:  Diagnosis Date  . Bilateral calf pain 03-02-2013   lower ext.doppler-mild arterial insufficiency to lower ext. this is a disease in val  . Cerebral atherosclerosis 03-02-2013   carotid duplex-  normal study  . GERD (gastroesophageal reflux disease)   . History of echocardiogram 08-02-2005   normal study   . Hyperlipemia   . Hypertension 03-30-2013   PV Angiogram- rt. renal artery stent was widely open,50-60% lt. renal artery stenosis,lt. SFA stents patent with high grade above the knee  poplital stenosis  . Hyponatremia 02/03/2017  . Kidney stones   . Normal cardiac stress test 03-25-2013   EF 60%, normal stress test ,this is a presurgical  test  . Peripheral vascular disease ([place])   . PVD (peripheral vascular disease) ([place])    A. 2009: S/P PTA/stent to D SFA. B. 01/2017: angiosculpt atherectomy/drug-eluting balloon angioplasty to L SFA.  [name] Renal artery stenosis ([place]) 03-02-2013   renal artery doppler-this was an abnormal doppler    Past Surgical History:  Procedure Laterality Date  . APPENDECTOMY  1960's  . CAROTID ENDARTERECTOMY Bilateral 2000's  . CYSTOSCOPY W/ STONE MANIPULATION  "X 4 or 5"  . FEMORAL ARTERY STENT Right 10/11/2014  . LITHOTRIPSY  "several"  . LOWER EXTREMITY ANGIOGRAM N/A 10/11/2014   Procedure: LOWER EXTREMITY ANGIOGRAM;  Surgeon: Lorretta Harp, MD;  Location: [facility] CATH LAB;  Service: Cardiovascular;  Laterality: N/A;  . LOWER EXTREMITY ANGIOGRAPHY N/A 02/04/2017   Procedure: Lower Extremity Angiography;  Surgeon: Lorretta Harp, MD;  Location: [place] CV LAB;  Service: Cardiovascular;  Laterality: N/A;  . PERCUTANEOUS STENT INTERVENTION  10/11/2014   Procedure: PERCUTANEOUS STENT INTERVENTION;  Surgeon: Lorretta Harp, MD;  Location: [facility] CATH LAB;  Service: Cardiovascular;;  right sfax2  .  PERIPHERAL VASCULAR CATHETERIZATION N/A 01/03/2017   Procedure: Carotid PTA/Stent Intervention / Right;  Surgeon: Lorretta Harp, MD;  Location: [place] CV LAB;  Service: Cardiovascular;  Laterality: N/A;  . PERIPHERAL VASCULAR INTERVENTION Left 02/04/2017   Procedure: Peripheral Vascular Intervention;  Surgeon: Lorretta Harp, MD;  Location: [place] CV  LAB;  Service: Cardiovascular;  Laterality: Left;  left SFA  . POPLITEAL ARTERY ANGIOPLASTY Left 03/30/2013  . TONSILLECTOMY  1960's    FAMHx:  Family History  Problem Relation Age of Onset  . Heart disease Father        CABG  . Healthy Sister   . Healthy Brother     SOCHx:   reports that he quit smoking about 4 years ago. His smoking use included cigars and cigarettes. He quit after 58.00 years of use. he has never used smokeless tobacco. He reports that he does not drink alcohol or use drugs.  ALLERGIES:  Allergies  Allergen Reactions  . Statins Swelling    Muscle pain    ROS: Pertinent items noted in HPI and remainder of comprehensive ROS otherwise negative.  HOME MEDS: Current Outpatient Medications on File Prior to Visit  Medication Sig Dispense Refill  . aspirin 81 MG tablet Take 81 mg by mouth daily.    . clopidogrel (PLAVIX) 75 MG tablet TAKE ONE TABLET BY MOUTH ONCE DAILY 30 tablet 10  . diltiazem (DILACOR XR) 240 MG 24 hr capsule Take 1 capsule (240 mg total) by mouth daily. 30 capsule 1  . ezetimibe (ZETIA) 10 MG tablet Take 1 tablet (10 mg total) by mouth daily. 30 tablet 11  . pantoprazole (PROTONIX) 40 MG tablet Take 1 tablet (40 mg total) by mouth daily. 30 tablet 1   No current facility-administered medications on file prior to visit.     LABS/IMAGING: No results Wyatt for this or any previous visit (from the past 48 hour(s)). No results Wyatt.  LIPID PANEL:    Component Value Date/Time   CHOL 317 (H) 10/15/2017 1032   TRIG 441 (H) 10/15/2017 1032   HDL 35 (L) 10/15/2017 1032   CHOLHDL 9.1 (H) 10/15/2017 1032   CHOLHDL 9.5 (H) 12/12/2016 1144   VLDL NOT CALC 12/12/2016 1144   LDLCALC Comment 10/15/2017 1032   LDLDIRECT 208 (H) 12/12/2016 1144     WEIGHTS: Wt Readings from Last 3 Encounters:  10/29/17 164 lb 3.2 oz (74.5 kg)  10/09/17 161 lb (73 kg)  02/26/17 154 lb 12.8 oz (70.2 kg)    VITALS: BP (!) 192/84   Pulse (!) 55   Ht $R'5\' 7"'WN$   (1.702 m)   Wt 164 lb 3.2 oz (74.5 kg)   SpO2 98%   BMI 25.72 kg/m   EXAM: General appearance: alert and no distress Lungs: clear to auscultation bilaterally Heart: regular rate and rhythm, S1, S2 normal and systolic murmur: systolic ejection 3/6, crescendo at 2nd right intercostal space Extremities: extremities normal, atraumatic, no cyanosis or edema Neurologic: Grossly normal Psych: Pleasant  EKG: Deferred  ASSESSMENT:  1. Mixed dyslipidemia - Friedrickson Type V (TG, TC, LDL) 2. Uncontrolled hypertension 3. PAD with SFA stent, renal artery stent, carotid disease. 4. Aortic stenosis  PLAN: 1.   Mr. Crooke has a marked dyslipidemia which is likely apologetic.  He has very high triglycerides, total cholesterol and likely LDL elevation as well.  Unfortunately was intolerant to rosuvastatin in the past.  Is not clear that he is tried other statins.  I like to put him  on a high potency atorvastatin 40 mg daily.  If he tolerates that in addition to ezetimibe, we could consider additional treatments.  He will likely need to be placed on a PC SK 9 inhibitor, but he is worried about cost of the medications.  We will repeat a lipid profile in a few months including a direct LDL.  Further recommendations at that time.  Finally, I discussed with Dr. Alvester Chou today about his ongoing hypertension.  His only current medication is diltiazem.  We will add irbesartan 150 mg daily.  Recheck a renal profile in 1 week.  His last metabolic profile was in February.  Follow-up with me in 3 months after his repeat labs.  Thanks for allowing me to participate in the care of this patient  Pixie Casino, MD, FACC, [place] Director of the Advanced Lipid Disorders &  Cardiovascular Risk Reduction Clinic Attending Cardiologist  Direct Dial: [phone]  Fax: [phone]  Website:  [email or web address] 10/29/2017, 1:09 PM

## 2017-10-29 NOTE — Patient Instructions (Addendum)
Your physician has recommended you make the following change in your medication:  START atorvastatin 40mg  once daily START irbesartan 150mg  once daily  Your physician recommends that you return for lab work in [place] to assess kidney function  Your physician recommends that you return for lab work in [place] fasting to check cholesterol   Your physician recommends that you schedule a follow-up appointment in [place] with Dr. Debara Pickett after your blood work

## 2017-11-04 ENCOUNTER — Other Ambulatory Visit: Payer: Self-pay | Admitting: *Deleted

## 2017-11-04 MED ORDER — PANTOPRAZOLE SODIUM 40 MG PO TBEC: 40.0000 mg | DELAYED_RELEASE_TABLET | Freq: Every day | ORAL | 5 refills | Status: DC

## 2017-11-05 DIAGNOSIS — Z79899 Other long term (current) drug therapy: Secondary | ICD-10-CM | POA: Diagnosis not present

## 2017-11-06 ENCOUNTER — Telehealth: Payer: Self-pay | Admitting: Internal Medicine

## 2017-11-06 ENCOUNTER — Other Ambulatory Visit: Payer: Self-pay | Admitting: *Deleted

## 2017-11-06 LAB — BASIC METABOLIC PANEL
BUN / CREAT RATIO: 13 (ref 10–24)
BUN: 19 mg/dL (ref 8–27)
CO2: 21 mmol/L (ref 20–29)
CREATININE: 1.51 mg/dL — AB (ref 0.76–1.27)
Calcium: 10.2 mg/dL (ref 8.6–10.2)
Chloride: 98 mmol/L (ref 96–106)
GFR calc non Af Amer: 45 mL/min/{1.73_m2} — ABNORMAL LOW (ref >59)
GFR, EST AFRICAN AMERICAN: 52 mL/min/{1.73_m2} — AB (ref >59)
Glucose: 96 mg/dL (ref 65–99)
Potassium: 4.4 mmol/L (ref 3.5–5.2)
Sodium: 134 mmol/L (ref 134–144)

## 2017-11-06 MED ORDER — PANTOPRAZOLE SODIUM 40 MG PO TBEC: 40.0000 mg | DELAYED_RELEASE_TABLET | Freq: Every day | ORAL | 11 refills | Status: DC

## 2017-11-06 NOTE — Telephone Encounter (Signed)
Returning your call,concerning his lab results.

## 2017-11-06 NOTE — Telephone Encounter (Signed)
Returned call to patient.   BP 180/105 before phone call. BP was 214/105 the other day  Patient takes irbesartan QHS and diltiazem QAM.  Advised to check home BP about 1 hour after each of these medications, feet flat on floor, arm at heart level. He uses arm cuff.   Informed him I would notify MD of his readings and call him back with recommendations.

## 2017-11-07 NOTE — Telephone Encounter (Signed)
If BP remains elevated, instruct him to further increase irbesartan to 300 mg daily.  Dr. Lemmie Evens

## 2017-11-08 MED ORDER — IRBESARTAN 300 MG PO TABS: 300.0000 mg | ORAL_TABLET | Freq: Every day | ORAL | Status: DC

## 2017-11-08 NOTE — Telephone Encounter (Signed)
Pt notified he will increase irbesartan to $RemoveBefor'300mg'ETTnkDmedcNJ$  daily he states that he just filled a 90 day rx he will call when he needs a refill

## 2017-11-08 NOTE — Telephone Encounter (Signed)
Patient returned call.   BP readings since 11/28 call 204/97 154/84 174/104 137/100 153/97 - a few hours prior to call 144/76 - 30 minutes prior to call  Routed to MD - need to increase ARB dose?

## 2017-11-08 NOTE — Telephone Encounter (Signed)
Increase irbesartan to 300 mg daily.  Dr. Lemmie Evens

## 2017-11-08 NOTE — Telephone Encounter (Signed)
LMTCB

## 2017-12-16 ENCOUNTER — Telehealth: Payer: Self-pay | Admitting: Internal Medicine

## 2017-12-16 MED ORDER — IRBESARTAN 300 MG PO TABS: 300.0000 mg | ORAL_TABLET | Freq: Every day | ORAL | 11 refills | Status: DC

## 2017-12-16 NOTE — Telephone Encounter (Signed)
New message     *STAT* If patient is at the pharmacy, call can be transferred to refill team.   1. Which medications need to be refilled? (please list name of each medication and dose if known) irbesartan (AVAPRO) 300 MG tablet  2. Which pharmacy/location (including street and city if local pharmacy) is medication to be sent to? [facility]- [address]  3. Do they need a 30 day or 90 day supply? [place]

## 2017-12-16 NOTE — Telephone Encounter (Signed)
Rx(s) sent to pharmacy electronically.  

## 2017-12-17 DIAGNOSIS — N390 Urinary tract infection, site not specified: Secondary | ICD-10-CM | POA: Diagnosis not present

## 2017-12-17 DIAGNOSIS — R300 Dysuria: Secondary | ICD-10-CM | POA: Diagnosis not present

## 2018-01-03 ENCOUNTER — Telehealth: Payer: Self-pay | Admitting: Internal Medicine

## 2018-01-03 NOTE — Telephone Encounter (Signed)
New message    Patient states his pharmacy and letter received asked him to talk to doctor in regards to changing his irbesartan (AVAPRO) 300 MG tablet.   Pt c/o medication issue:  1. Name of Medication: irbesartan (AVAPRO) 300 MG tablet  2. How are you currently taking this medication (dosage and times per day)? As prescribed  3. Are you having a reaction (difficulty breathing--STAT)? No  4. What is your medication issue? Concerned about cancer causing ingredient

## 2018-01-03 NOTE — Telephone Encounter (Signed)
Spoke to patient . Patient states he received a letter

## 2018-01-03 NOTE — Telephone Encounter (Signed)
From [employer] concerning medications  patent was told to contact office. patient does not have the affected lot number of medication per [facility].  Informed patient to continue medication Verbalized understanding.

## 2018-01-04 ENCOUNTER — Other Ambulatory Visit: Payer: Self-pay | Admitting: Cardiovascular Disease

## 2018-01-06 MED ORDER — EZETIMIBE 10 MG PO TABS: 10.0000 mg | ORAL_TABLET | Freq: Every day | ORAL | 3 refills | Status: DC

## 2018-01-06 NOTE — Telephone Encounter (Signed)
Follow up      *STAT* If patient is at the pharmacy, call can be transferred to refill team.   1. Which medications need to be refilled? (please list name of each medication and dose if known) ezetimibe (ZETIA) 10 MG tablet and clopidogrel (PLAVIX) 75 MG tablet  2. Which pharmacy/location (including street and city if local pharmacy) is medication to be sent to? [facility] [address]  3. Do they need a 30 day or 90 day supply? [place]

## 2018-01-06 NOTE — Telephone Encounter (Signed)
Rx(s) sent to pharmacy electronically.  

## 2018-01-15 DIAGNOSIS — N401 Enlarged prostate with lower urinary tract symptoms: Secondary | ICD-10-CM | POA: Diagnosis not present

## 2018-01-15 DIAGNOSIS — R3915 Urgency of urination: Secondary | ICD-10-CM | POA: Diagnosis not present

## 2018-01-25 ENCOUNTER — Other Ambulatory Visit: Payer: Self-pay | Admitting: Cardiovascular Disease

## 2018-01-27 NOTE — Telephone Encounter (Signed)
REFILL 

## 2018-02-03 ENCOUNTER — Telehealth: Payer: Self-pay | Admitting: *Deleted

## 2018-02-03 DIAGNOSIS — E782 Mixed hyperlipidemia: Secondary | ICD-10-CM | POA: Diagnosis not present

## 2018-02-03 LAB — LIPID PANEL
CHOLESTEROL TOTAL: 155 mg/dL (ref 100–199)
Chol/HDL Ratio: 4.3 ratio (ref 0.0–5.0)
HDL: 36 mg/dL — AB (ref >39)
LDL Calculated: 77 mg/dL (ref 0–99)
Triglycerides: 212 mg/dL — ABNORMAL HIGH (ref 0–149)
VLDL Cholesterol Cal: 42 mg/dL — ABNORMAL HIGH (ref 5–40)

## 2018-02-03 LAB — LDL CHOLESTEROL, DIRECT: LDL DIRECT: 97 mg/dL (ref 0–99)

## 2018-02-03 NOTE — Telephone Encounter (Signed)
Have him hold the atorvastatin for 1-2 weeks and see if rash starts to clear.  Call after that time to let us know.  Do not stop the irbesartan without contacting the office, as last office BP was quite elevated.

## 2018-02-03 NOTE — Telephone Encounter (Signed)
Patient made aware to hold the atorvastatin for a week or two. He will call back with an update at that point.

## 2018-02-03 NOTE — Telephone Encounter (Signed)
Called the patient. He stated that since he started the Atorvastatin and Irbesartan back in November he has developed a rash on his back and hips. He stated that he has tried to hold off all these months to see if it got better which it has not. Message routed to provider and pharm.

## 2018-02-19 ENCOUNTER — Telehealth: Payer: Self-pay | Admitting: Internal Medicine

## 2018-02-19 NOTE — Telephone Encounter (Signed)
New message    Patient calling with concerns about worsening chest pain for 1 month. Please call  Pt c/o of Chest Pain: STAT if CP now or developed within 24 hours  1. Are you having CP right now? NO  2. Are you experiencing any other symptoms (ex. SOB, nausea, vomiting, sweating)? A LITTLE SOB  3. How long have you been experiencing CP? 1 MONTH  4. Is your CP continuous or coming and going? COMING AND COMING 5. Have you taken Nitroglycerin? NO ?

## 2018-02-19 NOTE — Telephone Encounter (Signed)
Thanks . .will address this on Friday.  Dr. Lemmie Evens

## 2018-02-19 NOTE — Telephone Encounter (Signed)
Patient called with the c/o pain in the middle of his chest with activity rated 7-8/10, that has worsened over the last 6 months. Patient said after resting the pain goes away. Patient denies dizziness, sob or n/v. No active chest pain. Patient advised that he can discuss this with his doctor on Friday 02/21/18 during his appointment time and that if his symptoms got worse, that he needed to go to the ED for an evaluation. Patient verbalized understanding of plan.

## 2018-02-21 ENCOUNTER — Ambulatory Visit: Payer: Medicare Other | Admitting: Internal Medicine

## 2018-02-21 ENCOUNTER — Encounter: Payer: Self-pay | Admitting: Internal Medicine

## 2018-02-21 VITALS — BP 152/72 | HR 60 | Ht 68.0 in | Wt 160.2 lb

## 2018-02-21 DIAGNOSIS — I200 Unstable angina: Secondary | ICD-10-CM

## 2018-02-21 DIAGNOSIS — E782 Mixed hyperlipidemia: Secondary | ICD-10-CM | POA: Diagnosis not present

## 2018-02-21 DIAGNOSIS — I10 Essential (primary) hypertension: Secondary | ICD-10-CM | POA: Diagnosis not present

## 2018-02-21 DIAGNOSIS — I739 Peripheral vascular disease, unspecified: Secondary | ICD-10-CM | POA: Diagnosis not present

## 2018-02-21 LAB — CBC
Hematocrit: 33.2 % — ABNORMAL LOW (ref 37.5–51.0)
Hemoglobin: 11.2 g/dL — ABNORMAL LOW (ref 13.0–17.7)
MCH: 29 pg (ref 26.6–33.0)
MCHC: 33.7 g/dL (ref 31.5–35.7)
MCV: 86 fL (ref 79–97)
PLATELETS: 273 10*3/uL (ref 150–379)
RBC: 3.86 x10E6/uL — ABNORMAL LOW (ref 4.14–5.80)
RDW: 13.6 % (ref 12.3–15.4)
WBC: 6.7 10*3/uL (ref 3.4–10.8)

## 2018-02-21 LAB — BASIC METABOLIC PANEL
BUN / CREAT RATIO: 10 (ref 10–24)
BUN: 13 mg/dL (ref 8–27)
CHLORIDE: 102 mmol/L (ref 96–106)
CO2: 21 mmol/L (ref 20–29)
Calcium: 10 mg/dL (ref 8.6–10.2)
Creatinine, Ser: 1.27 mg/dL (ref 0.76–1.27)
GFR calc non Af Amer: 56 mL/min/{1.73_m2} — ABNORMAL LOW (ref >59)
GFR, EST AFRICAN AMERICAN: 64 mL/min/{1.73_m2} (ref >59)
GLUCOSE: 91 mg/dL (ref 65–99)
Potassium: 3.8 mmol/L (ref 3.5–5.2)
SODIUM: 138 mmol/L (ref 134–144)

## 2018-02-21 LAB — PROTIME-INR
INR: 1 (ref 0.8–1.2)
PROTHROMBIN TIME: 10.3 s (ref 9.1–12.0)

## 2018-02-21 MED ORDER — NITROGLYCERIN 0.4 MG SL SUBL: 0.4000 mg | SUBLINGUAL_TABLET | SUBLINGUAL | 3 refills | Status: DC | PRN

## 2018-02-21 NOTE — H&P (View-Only) (Signed)
OFFICE NOTE  Chief Complaint:  Elevated cholesterol, chest pain  Primary Care Physician: Jani Gravel, MD  HPI:  Joshua Wyatt is a 72 y.o. male patient of Dr. Gwenlyn Found with a past medial history significant for PAD with prior angioplasty to the left SFA, renal artery disease with prior renal artery stenting, hypertension, dyslipidemia and cerebral atherosclerosis, who has a long-standing history of dyslipidemia.  He reported that he had been told he had an elevated cholesterol as early as his 32s.  He has a history of medication intolerances including the statins although says that he most recently has been on rosuvastatin and that caused some muscle soreness as well as perioral numbness and tingling.  He denies ever taking atorvastatin.  Currently he is on ezetimibe 10 mg, aspirin, clopidogrel, diltiazem and pantoprazole.  Blood pressure recently has been elevated and was noted at Dr. Kennon Holter recent visit.  It is again today elevated 192/84.  I rechecked that manually at the end of the visit and it was 190/80.  He felt that this could be related to stress taking care of his wife who is been hospitalized numerous times recently with pneumonia and has a feeding tube.  Joshua Wyatt recent lipid profile 2 weeks ago showed a total cholesterol 317, triglycerides 441, HDL 35 and LDL was not calculated.  He is not HDL cholesterol is 282.  Given evidence of ASCVD and long-standing dyslipidemia, it is likely he has familial hypercholesterolemia.  My suspicion is a Friedrickson type V dyslipidemia with high LDL and high triglycerides.  Of note, recently he has been having some tingling in his arms and feeling jittery, particularly in the morning, which may be related to elevated blood pressure.  02/21/2018  Joshua Wyatt returns today for follow-up.  This is primarily to reassess his cholesterol after starting statin therapy.  He was started on atorvastatin 40 mg daily.  He reports tolerating the medication however  developed a rash recently.  Is not clear what this was due to or if it was related to the statin.  He was advised to stop the atorvastatin for 2 weeks.  The rash appears to be clearing.  His total cholesterol did come down remarkably.  From 317-155.  Triglycerides were 441 and decreased to 212 and LDL cholesterol has improved to 97.  That being said, he is also reporting symptoms today of chest pain.  This is a pretty new finding he says.  Over the past several months he has had some tightness in his chest.  Has become progressively worse.  He recently started mowing his lawn and was over seating it and noticed that he had pain in his chest when pushing the lawnmower, particularly up a small incline.  His wife also noted that he has become more symptomatic just even walking out to his mailbox.  He did have a stress test in January 2018 which was negative for ischemia.  He has extensive PAD and therefore is at increased coronary artery disease risk.  As his primary cardiologist is Dr. Alvester Chou, I discussed this with him by phone and being that his symptoms are concerning for unstable angina we agree that cardiac catheterization would be recommended.  PMHx:  Past Medical History:  Diagnosis Date  . Bilateral calf pain 03-02-2013   lower ext.doppler-mild arterial insufficiency to lower ext. this is a disease in val  . Cerebral atherosclerosis 03-02-2013   carotid duplex- normal study  . GERD (gastroesophageal reflux disease)   . History of  echocardiogram 08-02-2005   normal study   . Hyperlipemia   . Hypertension 03-30-2013   PV Angiogram- rt. renal artery stent was widely open,50-60% lt. renal artery stenosis,lt. SFA stents patent with high grade above the knee  poplital stenosis  . Hyponatremia 02/03/2017  . Kidney stones   . Normal cardiac stress test 03-25-2013   EF 60%, normal stress test ,this is a presurgical  test  . Peripheral vascular disease ([place])   . PVD (peripheral vascular disease) ([place])    A.  2009: S/P PTA/stent to D SFA. B. 01/2017: angiosculpt atherectomy/drug-eluting balloon angioplasty to L SFA.  [name] Renal artery stenosis ([place]) 03-02-2013   renal artery doppler-this was an abnormal doppler    Past Surgical History:  Procedure Laterality Date  . APPENDECTOMY  1960's  . CAROTID ENDARTERECTOMY Bilateral 2000's  . CYSTOSCOPY W/ STONE MANIPULATION  "X 4 or 5"  . FEMORAL ARTERY STENT Right 10/11/2014  . LITHOTRIPSY  "several"  . LOWER EXTREMITY ANGIOGRAM N/A 10/11/2014   Procedure: LOWER EXTREMITY ANGIOGRAM;  Surgeon: Lorretta Harp, MD;  Location: [facility] CATH LAB;  Service: Cardiovascular;  Laterality: N/A;  . LOWER EXTREMITY ANGIOGRAPHY N/A 02/04/2017   Procedure: Lower Extremity Angiography;  Surgeon: Lorretta Harp, MD;  Location: [place] CV LAB;  Service: Cardiovascular;  Laterality: N/A;  . PERCUTANEOUS STENT INTERVENTION  10/11/2014   Procedure: PERCUTANEOUS STENT INTERVENTION;  Surgeon: Lorretta Harp, MD;  Location: [facility] CATH LAB;  Service: Cardiovascular;;  right sfax2  . PERIPHERAL VASCULAR CATHETERIZATION N/A 01/03/2017   Procedure: Carotid PTA/Stent Intervention / Right;  Surgeon: Lorretta Harp, MD;  Location: [place] CV LAB;  Service: Cardiovascular;  Laterality: N/A;  . PERIPHERAL VASCULAR INTERVENTION Left 02/04/2017   Procedure: Peripheral Vascular Intervention;  Surgeon: Lorretta Harp, MD;  Location: [place] CV LAB;  Service: Cardiovascular;  Laterality: Left;  left SFA  . POPLITEAL ARTERY ANGIOPLASTY Left 03/30/2013  . TONSILLECTOMY  1960's    FAMHx:  Family History  Problem Relation Age of Onset  . Heart disease Father        CABG  . Healthy Sister   . Healthy Brother     SOCHx:   reports that he quit smoking about 4 years ago. His smoking use included cigars and cigarettes. He quit after 58.00 years of use. he has never used smokeless tobacco. He reports that he does not drink alcohol or use drugs.  ALLERGIES:  Allergies  Allergen  Reactions  . Statins Swelling    Muscle pain    ROS: Pertinent items noted in HPI and remainder of comprehensive ROS otherwise negative.  HOME MEDS: Current Outpatient Medications on File Prior to Visit  Medication Sig Dispense Refill  . aspirin 81 MG tablet Take 81 mg by mouth daily.    [name] atorvastatin (LIPITOR) 40 MG tablet Take 40 mg by mouth daily.    . clopidogrel (PLAVIX) 75 MG tablet TAKE 1 TABLET BY MOUTH ONCE DAILY 90 tablet 3  . DILT-XR 240 MG 24 hr capsule TAKE 1 CAPSULE BY MOUTH DAILY 90 capsule 1  . ezetimibe (ZETIA) 10 MG tablet Take 1 tablet (10 mg total) by mouth daily. 90 tablet 3  . irbesartan (AVAPRO) 300 MG tablet Take 1 tablet (300 mg total) by mouth daily. 30 tablet 11  . pantoprazole (PROTONIX) 40 MG tablet Take 1 tablet (40 mg total) by mouth daily. 30 tablet 11  . tamsulosin (FLOMAX) 0.4 MG CAPS capsule Take 0.4 mg by mouth daily.  No current facility-administered medications on file prior to visit.     LABS/IMAGING: No results found for this or any previous visit (from the past 48 hour(s)). No results found.  LIPID PANEL:    Component Value Date/Time   CHOL 155 02/03/2018 1031   TRIG 212 (H) 02/03/2018 1031   HDL 36 (L) 02/03/2018 1031   CHOLHDL 4.3 02/03/2018 1031   CHOLHDL 9.5 (H) 12/12/2016 1144   VLDL NOT CALC 12/12/2016 1144   LDLCALC 77 02/03/2018 1031   LDLDIRECT 97 02/03/2018 1031   LDLDIRECT 208 (H) 12/12/2016 1144     WEIGHTS: Wt Readings from Last 3 Encounters:  02/21/18 160 lb 3.2 oz (72.7 kg)  10/29/17 164 lb 3.2 oz (74.5 kg)  10/09/17 161 lb (73 kg)    VITALS: BP (!) 152/72   Pulse 60   Ht $R'5\' 8"'Bj$  (1.727 m)   Wt 160 lb 3.2 oz (72.7 kg)   BMI 24.36 kg/m   EXAM: General appearance: alert and no distress Neck: no carotid bruit, no JVD and thyroid not enlarged, symmetric, no tenderness/mass/nodules Lungs: clear to auscultation bilaterally Heart: regular rate and rhythm, S1, S2 normal and systolic murmur: systolic ejection  3/6, crescendo at 2nd right intercostal space Abdomen: soft, non-tender; bowel sounds normal; no masses,  no organomegaly Extremities: extremities normal, atraumatic, no cyanosis or edema Pulses: 2+ and symmetric Skin: Skin color, texture, turgor normal. No rashes or lesions Neurologic: Grossly normal Psych: Pleasant  EKG: Sinus rhythm with PACs at 60, nonspecific T wave changes-personally reviewed  ASSESSMENT:  1. Unstable angina 2. Mixed dyslipidemia - Friedrickson Type V (TG, TC, LDL) 3. Uncontrolled hypertension 4. PAD with SFA stent, renal artery stent, carotid disease. 5. Aortic stenosis  PLAN: 1.   Joshua Wyatt had significant reduction in his cholesterol on atorvastatin and may have had a rash related to that.  He is been off of the statin for 2 weeks.  I like for him to continue to hold that until we can sort out his chest pain.  It sounds like he is having unstable angina.  He had a negative stress test in January 2018 but his symptoms are fairly classic and he has significant PAD.  I discussed the case with Dr. Gwenlyn Found and he agrees that we should proceed with cardiac catheterization.  He will be scheduled with Dr. Gwenlyn Found next Monday.  I advised him to consider restarting his atorvastatin after catheterization.  Pixie Casino, MD, [facility], [place] Director of the Advanced Lipid Disorders &  Cardiovascular Risk Reduction Clinic Attending Cardiologist  Direct Dial: [phone]  Fax: [phone]  Website:  [email or web address] 02/21/2018, 10:42 AM

## 2018-02-21 NOTE — Patient Instructions (Signed)
Your physician recommends that you schedule a follow-up appointment in 2-3 weeks after heart cath with either Dr. Gwenlyn Found or PA/NP  Remain off atorvastatin (lipitor) until after heart cath procedure.     [facility] [address] [place] [address] Dept: [phone] Loc: [place]  02/21/2018  You are scheduled for a Cardiac Catheterization on Monday, March 18 with Dr. [name].  1. Please arrive at the [facility] (Main Entrance A) at [facility]: [address] [place], [place] [address] at 1:00 PM (two hours before your procedure to ensure your preparation). Free valet parking service is available.   Special note: Every effort is made to have your procedure done on time. Please understand that emergencies sometimes delay scheduled procedures.  2. Diet: you can have a LIGHT breakfast before 7:30am but nothing after  3. Labs: March 15th - CBC, BMET, PT-INR  4. Medication instructions in preparation for your procedure: -- On the morning of your procedure, take your Aspirin & Plavix and any morning medicines NOT listed above.  You may use sips of water.  5. Plan for one night stay--bring personal belongings. 6. Bring a current list of your medications and current insurance cards. 7. You MUST have a responsible person to drive you home. 8. Someone MUST be with you the first 24 hours after you arrive home or your discharge will be delayed. 9. Please wear clothes that are easy to get on and off and wear slip-on shoes.  Thank you for allowing [place] to care for you!   -- [facility]

## 2018-02-21 NOTE — Progress Notes (Signed)
OFFICE NOTE  Chief Complaint:  Elevated cholesterol, chest pain  Primary Care Physician: Jani Gravel, MD  HPI:  Joshua Wyatt is a 72 y.o. male patient of Dr. Gwenlyn Found with a past medial history significant for PAD with prior angioplasty to the left SFA, renal artery disease with prior renal artery stenting, hypertension, dyslipidemia and cerebral atherosclerosis, who has a long-standing history of dyslipidemia.  He reported that he had been told he had an elevated cholesterol as early as his 106s.  He has a history of medication intolerances including the statins although says that he most recently has been on rosuvastatin and that caused some muscle soreness as well as perioral numbness and tingling.  He denies ever taking atorvastatin.  Currently he is on ezetimibe 10 mg, aspirin, clopidogrel, diltiazem and pantoprazole.  Blood pressure recently has been elevated and was noted at Dr. Kennon Holter recent visit.  It is again today elevated 192/84.  I rechecked that manually at the end of the visit and it was 190/80.  He felt that this could be related to stress taking care of his wife who is been hospitalized numerous times recently with pneumonia and has a feeding tube.  Joshua Wyatt recent lipid profile 2 weeks ago showed a total cholesterol 317, triglycerides 441, HDL 35 and LDL was not calculated.  He is not HDL cholesterol is 282.  Given evidence of ASCVD and long-standing dyslipidemia, it is likely he has familial hypercholesterolemia.  My suspicion is a Friedrickson type V dyslipidemia with high LDL and high triglycerides.  Of note, recently he has been having some tingling in his arms and feeling jittery, particularly in the morning, which may be related to elevated blood pressure.  02/21/2018  Joshua Wyatt returns today for follow-up.  This is primarily to reassess his cholesterol after starting statin therapy.  He was started on atorvastatin 40 mg daily.  He reports tolerating the medication however  developed a rash recently.  Is not clear what this was due to or if it was related to the statin.  He was advised to stop the atorvastatin for 2 weeks.  The rash appears to be clearing.  His total cholesterol did come down remarkably.  From 317-155.  Triglycerides were 441 and decreased to 212 and LDL cholesterol has improved to 97.  That being said, he is also reporting symptoms today of chest pain.  This is a pretty new finding he says.  Over the past several months he has had some tightness in his chest.  Has become progressively worse.  He recently started mowing his lawn and was over seating it and noticed that he had pain in his chest when pushing the lawnmower, particularly up a small incline.  His wife also noted that he has become more symptomatic just even walking out to his mailbox.  He did have a stress test in January 2018 which was negative for ischemia.  He has extensive PAD and therefore is at increased coronary artery disease risk.  As his primary cardiologist is Dr. Alvester Chou, I discussed this with him by phone and being that his symptoms are concerning for unstable angina we agree that cardiac catheterization would be recommended.  PMHx:  Past Medical History:  Diagnosis Date  . Bilateral calf pain 03-02-2013   lower ext.doppler-mild arterial insufficiency to lower ext. this is a disease in val  . Cerebral atherosclerosis 03-02-2013   carotid duplex- normal study  . GERD (gastroesophageal reflux disease)   . History of  echocardiogram 08-02-2005   normal study   . Hyperlipemia   . Hypertension 03-30-2013   PV Angiogram- rt. renal artery stent was widely open,50-60% lt. renal artery stenosis,lt. SFA stents patent with high grade above the knee  poplital stenosis  . Hyponatremia 02/03/2017  . Kidney stones   . Normal cardiac stress test 03-25-2013   EF 60%, normal stress test ,this is a presurgical  test  . Peripheral vascular disease ([place])   . PVD (peripheral vascular disease) ([place])    A.  2009: S/P PTA/stent to D SFA. B. 01/2017: angiosculpt atherectomy/drug-eluting balloon angioplasty to L SFA.  [name] Renal artery stenosis ([place]) 03-02-2013   renal artery doppler-this was an abnormal doppler    Past Surgical History:  Procedure Laterality Date  . APPENDECTOMY  1960's  . CAROTID ENDARTERECTOMY Bilateral 2000's  . CYSTOSCOPY W/ STONE MANIPULATION  "X 4 or 5"  . FEMORAL ARTERY STENT Right 10/11/2014  . LITHOTRIPSY  "several"  . LOWER EXTREMITY ANGIOGRAM N/A 10/11/2014   Procedure: LOWER EXTREMITY ANGIOGRAM;  Surgeon: Lorretta Harp, MD;  Location: [facility] CATH LAB;  Service: Cardiovascular;  Laterality: N/A;  . LOWER EXTREMITY ANGIOGRAPHY N/A 02/04/2017   Procedure: Lower Extremity Angiography;  Surgeon: Lorretta Harp, MD;  Location: [facility] CV LAB;  Service: Cardiovascular;  Laterality: N/A;  . PERCUTANEOUS STENT INTERVENTION  10/11/2014   Procedure: PERCUTANEOUS STENT INTERVENTION;  Surgeon: Lorretta Harp, MD;  Location: [facility] CATH LAB;  Service: Cardiovascular;;  right sfax2  . PERIPHERAL VASCULAR CATHETERIZATION N/A 01/03/2017   Procedure: Carotid PTA/Stent Intervention / Right;  Surgeon: Lorretta Harp, MD;  Location: [facility] CV LAB;  Service: Cardiovascular;  Laterality: N/A;  . PERIPHERAL VASCULAR INTERVENTION Left 02/04/2017   Procedure: Peripheral Vascular Intervention;  Surgeon: Lorretta Harp, MD;  Location: [facility] CV LAB;  Service: Cardiovascular;  Laterality: Left;  left SFA  . POPLITEAL ARTERY ANGIOPLASTY Left 03/30/2013  . TONSILLECTOMY  1960's    FAMHx:  Family History  Problem Relation Age of Onset  . Heart disease Father        CABG  . Healthy Sister   . Healthy Brother     SOCHx:   reports that he quit smoking about 4 years ago. His smoking use included cigars and cigarettes. He quit after 58.00 years of use. he has never used smokeless tobacco. He reports that he does not drink alcohol or use drugs.  ALLERGIES:  Allergies  Allergen  Reactions  . Statins Swelling    Muscle pain    ROS: Pertinent items noted in HPI and remainder of comprehensive ROS otherwise negative.  HOME MEDS: Current Outpatient Medications on File Prior to Visit  Medication Sig Dispense Refill  . aspirin 81 MG tablet Take 81 mg by mouth daily.    [name] atorvastatin (LIPITOR) 40 MG tablet Take 40 mg by mouth daily.    . clopidogrel (PLAVIX) 75 MG tablet TAKE 1 TABLET BY MOUTH ONCE DAILY 90 tablet 3  . DILT-XR 240 MG 24 hr capsule TAKE 1 CAPSULE BY MOUTH DAILY 90 capsule 1  . ezetimibe (ZETIA) 10 MG tablet Take 1 tablet (10 mg total) by mouth daily. 90 tablet 3  . irbesartan (AVAPRO) 300 MG tablet Take 1 tablet (300 mg total) by mouth daily. 30 tablet 11  . pantoprazole (PROTONIX) 40 MG tablet Take 1 tablet (40 mg total) by mouth daily. 30 tablet 11  . tamsulosin (FLOMAX) 0.4 MG CAPS capsule Take 0.4 mg by mouth daily.  No current facility-administered medications on file prior to visit.     LABS/IMAGING: No results found for this or any previous visit (from the past 48 hour(s)). No results found.  LIPID PANEL:    Component Value Date/Time   CHOL 155 02/03/2018 1031   TRIG 212 (H) 02/03/2018 1031   HDL 36 (L) 02/03/2018 1031   CHOLHDL 4.3 02/03/2018 1031   CHOLHDL 9.5 (H) 12/12/2016 1144   VLDL NOT CALC 12/12/2016 1144   LDLCALC 77 02/03/2018 1031   LDLDIRECT 97 02/03/2018 1031   LDLDIRECT 208 (H) 12/12/2016 1144     WEIGHTS: Wt Readings from Last 3 Encounters:  02/21/18 160 lb 3.2 oz (72.7 kg)  10/29/17 164 lb 3.2 oz (74.5 kg)  10/09/17 161 lb (73 kg)    VITALS: BP (!) 152/72   Pulse 60   Ht $R'5\' 8"'AD$  (1.727 m)   Wt 160 lb 3.2 oz (72.7 kg)   BMI 24.36 kg/m   EXAM: General appearance: alert and no distress Neck: no carotid bruit, no JVD and thyroid not enlarged, symmetric, no tenderness/mass/nodules Lungs: clear to auscultation bilaterally Heart: regular rate and rhythm, S1, S2 normal and systolic murmur: systolic ejection  3/6, crescendo at 2nd right intercostal space Abdomen: soft, non-tender; bowel sounds normal; no masses,  no organomegaly Extremities: extremities normal, atraumatic, no cyanosis or edema Pulses: 2+ and symmetric Skin: Skin color, texture, turgor normal. No rashes or lesions Neurologic: Grossly normal Psych: Pleasant  EKG: Sinus rhythm with PACs at 60, nonspecific T wave changes-personally reviewed  ASSESSMENT:  1. Unstable angina 2. Mixed dyslipidemia - Friedrickson Type V (TG, TC, LDL) 3. Uncontrolled hypertension 4. PAD with SFA stent, renal artery stent, carotid disease. 5. Aortic stenosis  PLAN: 1.   Joshua Wyatt had significant reduction in his cholesterol on atorvastatin and may have had a rash related to that.  He is been off of the statin for 2 weeks.  I like for him to continue to hold that until we can sort out his chest pain.  It sounds like he is having unstable angina.  He had a negative stress test in January 2018 but his symptoms are fairly classic and he has significant PAD.  I discussed the case with Dr. Gwenlyn Found and he agrees that we should proceed with cardiac catheterization.  He will be scheduled with Dr. Gwenlyn Found next Monday.  I advised him to consider restarting his atorvastatin after catheterization.  Pixie Casino, MD, [facility], [place] Director of the [facility] Lipid Disorders &  Cardiovascular Risk Reduction Clinic Attending Cardiologist  Direct Dial: [phone]  Fax: [phone]  Website:  [email or web address] 02/21/2018, 10:42 AM

## 2018-02-24 ENCOUNTER — Ambulatory Visit (HOSPITAL_COMMUNITY)
Admission: RE | Disposition: A | Discharge status: Home / Self Care | Payer: Self-pay | Source: Ambulatory Visit | Attending: Cardiovascular Disease

## 2018-02-24 ENCOUNTER — Observation Stay (HOSPITAL_COMMUNITY)
Admission: RE | Admit: 2018-02-24 | Discharge: 2018-02-25 | Disposition: A | Discharge status: Home / Self Care | Payer: Medicare Other | Source: Ambulatory Visit | Attending: Cardiovascular Disease | Admitting: Cardiovascular Disease

## 2018-02-24 DIAGNOSIS — Z888 Allergy status to other drugs, medicaments and biological substances status: Secondary | ICD-10-CM | POA: Diagnosis not present

## 2018-02-24 DIAGNOSIS — I25110 Atherosclerotic heart disease of native coronary artery with unstable angina pectoris: Secondary | ICD-10-CM | POA: Diagnosis not present

## 2018-02-24 DIAGNOSIS — I25118 Atherosclerotic heart disease of native coronary artery with other forms of angina pectoris: Secondary | ICD-10-CM

## 2018-02-24 DIAGNOSIS — I200 Unstable angina: Secondary | ICD-10-CM

## 2018-02-24 DIAGNOSIS — Z7902 Long term (current) use of antithrombotics/antiplatelets: Secondary | ICD-10-CM | POA: Insufficient documentation

## 2018-02-24 DIAGNOSIS — Z79899 Other long term (current) drug therapy: Secondary | ICD-10-CM | POA: Insufficient documentation

## 2018-02-24 DIAGNOSIS — I739 Peripheral vascular disease, unspecified: Secondary | ICD-10-CM | POA: Insufficient documentation

## 2018-02-24 DIAGNOSIS — Z8249 Family history of ischemic heart disease and other diseases of the circulatory system: Secondary | ICD-10-CM | POA: Diagnosis not present

## 2018-02-24 DIAGNOSIS — K219 Gastro-esophageal reflux disease without esophagitis: Secondary | ICD-10-CM | POA: Insufficient documentation

## 2018-02-24 DIAGNOSIS — Z7982 Long term (current) use of aspirin: Secondary | ICD-10-CM | POA: Diagnosis not present

## 2018-02-24 DIAGNOSIS — I10 Essential (primary) hypertension: Secondary | ICD-10-CM | POA: Diagnosis present

## 2018-02-24 DIAGNOSIS — E782 Mixed hyperlipidemia: Secondary | ICD-10-CM | POA: Diagnosis not present

## 2018-02-24 DIAGNOSIS — Z87891 Personal history of nicotine dependence: Secondary | ICD-10-CM | POA: Diagnosis not present

## 2018-02-24 DIAGNOSIS — I441 Atrioventricular block, second degree: Secondary | ICD-10-CM | POA: Diagnosis not present

## 2018-02-24 DIAGNOSIS — Z9889 Other specified postprocedural states: Secondary | ICD-10-CM

## 2018-02-24 DIAGNOSIS — I350 Nonrheumatic aortic (valve) stenosis: Secondary | ICD-10-CM | POA: Diagnosis not present

## 2018-02-24 HISTORY — PX: LEFT HEART CATH AND CORONARY ANGIOGRAPHY: CATH118249

## 2018-02-24 HISTORY — DX: Atherosclerotic heart disease of native coronary artery without angina pectoris: I25.10

## 2018-02-24 SURGERY — LEFT HEART CATH AND CORONARY ANGIOGRAPHY
Anesthesia: LOCAL

## 2018-02-24 MED ORDER — SODIUM CHLORIDE 0.9 % IV SOLN: INTRAVENOUS | Status: AC

## 2018-02-24 MED ORDER — SODIUM CHLORIDE 0.9% FLUSH
3.0000 mL | Freq: Two times a day (BID) | INTRAVENOUS | Status: DC
  Administered 2018-02-24: 22:00:00 3 mL via INTRAVENOUS

## 2018-02-24 MED ORDER — LIDOCAINE HCL (PF) 1 % IJ SOLN
INTRAMUSCULAR | Status: DC | PRN
  Administered 2018-02-24: 10 mL via INTRA_ARTERIAL

## 2018-02-24 MED ORDER — MORPHINE SULFATE (PF) 10 MG/ML IV SOLN: 2.0000 mg | INTRAVENOUS | Status: DC | PRN

## 2018-02-24 MED ORDER — SODIUM CHLORIDE 0.9% FLUSH: 3.0000 mL | INTRAVENOUS | Status: DC | PRN

## 2018-02-24 MED ORDER — ASPIRIN 81 MG PO TABS: 81.0000 mg | ORAL_TABLET | Freq: Every day | ORAL | Status: DC

## 2018-02-24 MED ORDER — FENTANYL CITRATE (PF) 100 MCG/2ML IJ SOLN
INTRAMUSCULAR | Status: AC
  Filled 2018-02-24: qty 2

## 2018-02-24 MED ORDER — HYDRALAZINE HCL 25 MG PO TABS
25.0000 mg | ORAL_TABLET | Freq: Four times a day (QID) | ORAL | Status: DC | PRN
  Administered 2018-02-24 – 2018-02-25 (×2): 25 mg via ORAL
  Filled 2018-02-24 (×2): qty 1

## 2018-02-24 MED ORDER — ACETAMINOPHEN 325 MG PO TABS: 650.0000 mg | ORAL_TABLET | ORAL | Status: DC | PRN

## 2018-02-24 MED ORDER — HEPARIN SODIUM (PORCINE) 1000 UNIT/ML IJ SOLN
INTRAMUSCULAR | Status: DC | PRN
  Administered 2018-02-24: 3500 [IU] via INTRAVENOUS

## 2018-02-24 MED ORDER — FENTANYL CITRATE (PF) 100 MCG/2ML IJ SOLN
INTRAMUSCULAR | Status: DC | PRN
  Administered 2018-02-24: 25 ug via INTRAVENOUS

## 2018-02-24 MED ORDER — CLOPIDOGREL BISULFATE 75 MG PO TABS
75.0000 mg | ORAL_TABLET | Freq: Every day | ORAL | Status: DC
  Administered 2018-02-25: 75 mg via ORAL
  Filled 2018-02-24: qty 1

## 2018-02-24 MED ORDER — ASPIRIN 81 MG PO CHEW: 81.0000 mg | CHEWABLE_TABLET | ORAL | Status: DC

## 2018-02-24 MED ORDER — EZETIMIBE 10 MG PO TABS
10.0000 mg | ORAL_TABLET | Freq: Every day | ORAL | Status: DC
  Administered 2018-02-25: 09:00:00 10 mg via ORAL
  Filled 2018-02-24: qty 1

## 2018-02-24 MED ORDER — SODIUM CHLORIDE 0.9 % IV SOLN: 250.0000 mL | INTRAVENOUS | Status: DC | PRN

## 2018-02-24 MED ORDER — ASPIRIN 81 MG PO CHEW
81.0000 mg | CHEWABLE_TABLET | Freq: Every day | ORAL | Status: DC
  Administered 2018-02-25: 09:00:00 81 mg via ORAL
  Filled 2018-02-24: qty 1

## 2018-02-24 MED ORDER — MORPHINE SULFATE (PF) 2 MG/ML IV SOLN: 2.0000 mg | INTRAVENOUS | Status: DC | PRN

## 2018-02-24 MED ORDER — LIDOCAINE HCL (PF) 1 % IJ SOLN
INTRAMUSCULAR | Status: DC | PRN
  Administered 2018-02-24: 2 mL

## 2018-02-24 MED ORDER — LIDOCAINE HCL 1 % IJ SOLN
INTRAMUSCULAR | Status: AC
  Filled 2018-02-24: qty 20

## 2018-02-24 MED ORDER — HEPARIN SODIUM (PORCINE) 1000 UNIT/ML IJ SOLN
INTRAMUSCULAR | Status: AC
  Filled 2018-02-24: qty 1

## 2018-02-24 MED ORDER — IOPAMIDOL (ISOVUE-370) INJECTION 76%
INTRAVENOUS | Status: AC
  Filled 2018-02-24: qty 100

## 2018-02-24 MED ORDER — ONDANSETRON HCL 4 MG/2ML IJ SOLN: 4.0000 mg | Freq: Four times a day (QID) | INTRAMUSCULAR | Status: DC | PRN

## 2018-02-24 MED ORDER — IOPAMIDOL (ISOVUE-370) INJECTION 76%
INTRAVENOUS | Status: DC | PRN
  Administered 2018-02-24: 165 mL via INTRA_ARTERIAL

## 2018-02-24 MED ORDER — HEPARIN (PORCINE) IN NACL 2-0.9 UNIT/ML-% IJ SOLN
INTRAMUSCULAR | Status: AC | PRN
  Administered 2018-02-24: 1000 mL

## 2018-02-24 MED ORDER — MIDAZOLAM HCL 2 MG/2ML IJ SOLN
INTRAMUSCULAR | Status: AC
  Filled 2018-02-24: qty 2

## 2018-02-24 MED ORDER — DILTIAZEM HCL ER COATED BEADS 240 MG PO CP24
240.0000 mg | ORAL_CAPSULE | Freq: Every day | ORAL | Status: DC
  Filled 2018-02-24: qty 1

## 2018-02-24 MED ORDER — ATORVASTATIN CALCIUM 40 MG PO TABS
40.0000 mg | ORAL_TABLET | Freq: Every day | ORAL | Status: DC
  Filled 2018-02-24: qty 1

## 2018-02-24 MED ORDER — VERAPAMIL HCL 2.5 MG/ML IV SOLN
INTRAVENOUS | Status: AC
  Filled 2018-02-24: qty 2

## 2018-02-24 MED ORDER — PANTOPRAZOLE SODIUM 40 MG PO TBEC
40.0000 mg | DELAYED_RELEASE_TABLET | Freq: Every day | ORAL | Status: DC
  Administered 2018-02-25: 09:00:00 40 mg via ORAL
  Filled 2018-02-24: qty 1

## 2018-02-24 MED ORDER — HEPARIN (PORCINE) IN NACL 2-0.9 UNIT/ML-% IJ SOLN
INTRAMUSCULAR | Status: AC
  Filled 2018-02-24: qty 1000

## 2018-02-24 MED ORDER — MIDAZOLAM HCL 2 MG/2ML IJ SOLN
INTRAMUSCULAR | Status: DC | PRN
  Administered 2018-02-24: 1 mg via INTRAVENOUS

## 2018-02-24 MED ORDER — SODIUM CHLORIDE 0.9 % IV SOLN
INTRAVENOUS | Status: DC
  Administered 2018-02-24: 13:00:00 via INTRAVENOUS

## 2018-02-24 SURGICAL SUPPLY — 14 items
CATH INFINITI 5 FR AR1 MOD (CATHETERS) ×1 IMPLANT
CATH INFINITI 5FR AL1 (CATHETERS) ×1 IMPLANT
CATH INFINITI 5FR ANG PIGTAIL (CATHETERS) ×1 IMPLANT
CATH INFINITI JR4 5F (CATHETERS) ×1 IMPLANT
CATH OPTITORQUE TIG 4.0 5F (CATHETERS) ×1 IMPLANT
GLIDESHEATH SLEND A-KIT 6F 22G (SHEATH) ×1 IMPLANT
GUIDEWIRE INQWIRE 1.5J.035X260 (WIRE) IMPLANT
INQWIRE 1.5J .035X260CM (WIRE) ×2
KIT HEART LEFT (KITS) ×2 IMPLANT
PACK CARDIAC CATHETERIZATION (CUSTOM PROCEDURE TRAY) ×2 IMPLANT
SYR MEDRAD MARK V 150ML (SYRINGE) ×2 IMPLANT
TRANSDUCER W/STOPCOCK (MISCELLANEOUS) ×2 IMPLANT
TUBING CIL FLEX 10 FLL-RA (TUBING) ×2 IMPLANT
WIRE HI TORQ VERSACORE-J 145CM (WIRE) ×1 IMPLANT

## 2018-02-24 NOTE — Progress Notes (Signed)
Client c/o right arm painful and noted swelling right arm and Aaron Edelman from cath lab in to see client and pressure held x 31min

## 2018-02-24 NOTE — Progress Notes (Signed)
Called back to Short stay for continued arm swelling. 3cc added to hemostasis band for a total of 9cc. Blood pressure cuff applied to right forearm for 5 minutes, maintaining pleth waveform. Cuff deflated for 3 minutes then re inflated for 5 minutes. Cuff removed Spo2 95% as measured in right thumb. Forearm softer, less painful down to 1 1/2 on a 10 scale. Ellen Henri present for additional assessment.

## 2018-02-24 NOTE — Progress Notes (Signed)
Report called to [facility] for 6-C-1 and transferred via wheelchair

## 2018-02-24 NOTE — Discharge Instructions (Signed)
**Note -identified via Obfuscation** Radial Site Care °Refer to this sheet in the next few weeks. These instructions provide you with information about caring for yourself after your procedure. Your health care provider may also give you more specific instructions. Your treatment has been planned according to current medical practices, but problems sometimes occur. Call your health care provider if you have any problems or questions after your procedure. °What can I expect after the procedure? °After your procedure, it is typical to have the following: °· Bruising at the radial site that usually fades within 1-2 weeks. °· Blood collecting in the tissue (hematoma) that may be painful to the touch. It should usually decrease in size and tenderness within 1-2 weeks. ° °Follow these instructions at home: °· Take medicines only as directed by your health care provider. °· You may shower 24-48 hours after the procedure or as directed by your health care provider. Remove the bandage (dressing) and gently wash the site with plain soap and water. Pat the area dry with a clean towel. Do not rub the site, because this may cause bleeding. °· Do not take baths, swim, or use a hot tub until your health care provider approves. °· Check your insertion site every day for redness, swelling, or drainage. °· Do not apply powder or lotion to the site. °· Do not flex or bend the affected arm for 24 hours or as directed by your health care provider. °· Do not push or pull heavy objects with the affected arm for 24 hours or as directed by your health care provider. °· Do not lift over 10 lb (4.5 kg) for 5 days after your procedure or as directed by your health care provider. °· Ask your health care provider when it is okay to: °? Return to work or school. °? Resume usual physical activities or sports. °? Resume sexual activity. °· Do not drive home if you are discharged the same day as the procedure. Have someone else drive you. °· You may drive 24 hours after the procedure  unless otherwise instructed by your health care provider. °· Do not operate machinery or power tools for 24 hours after the procedure. °· If your procedure was done as an outpatient procedure, which means that you went home the same day as your procedure, a responsible adult should be with you for the first 24 hours after you arrive home. °· Keep all follow-up visits as directed by your health care provider. This is important. °Contact a health care provider if: °· You have a fever. °· You have chills. °· You have increased bleeding from the radial site. Hold pressure on the site. °Get help right away if: °· You have unusual pain at the radial site. °· You have redness, warmth, or swelling at the radial site. °· You have drainage (other than a small amount of blood on the dressing) from the radial site. °· The radial site is bleeding, and the bleeding does not stop after 30 minutes of holding steady pressure on the site. °· Your arm or hand becomes pale, cool, tingly, or numb. °This information is not intended to replace advice given to you by your health care provider. Make sure you discuss any questions you have with your health care provider. °Document Released: 12/29/2010 Document Revised: 05/03/2016 Document Reviewed: 06/14/2014 °Elsevier Interactive Patient Education © 2018 [employer]. ° °

## 2018-02-24 NOTE — Progress Notes (Addendum)
Called to short stay for right arm hematoma. Patient reacts painfully to palpating from hemostasis band to mid forearm. 3cc air added at 18:30 to hemostasis band, in addition to the 3 added by Flavia Shipper for a total of 6 cc. Manual pressure held proximal to hemostasis band for 15 minutes. No additional swelling noted , coban pressure dressing applied. P.A. Called by Flavia Shipper for additional assessment. Spo2 97% as measured in right thumb

## 2018-02-24 NOTE — Progress Notes (Signed)
   Was called to short stay for right arm hematoma post radial cath. Pt still with slight oozing of blood with TR band still inflated. Cath lab tech held additional pressure. He has normal sensation in his right hand. Normal color and warmth. He has mild swelling in his right forearm. Pt reassessed by cath lab tech, who recommended overnight observation. I notified Dr. Gwenlyn Found who agrees. Will admit to observation on [place] for further management and observation.

## 2018-02-24 NOTE — Interval H&P Note (Signed)
Cath Lab Visit (complete for each Cath Lab visit)  Clinical Evaluation Leading to the Procedure:   ACS: No.  Non-ACS:    Anginal Classification: CCS II  Anti-ischemic medical therapy: Minimal Therapy (1 class of medications)  Non-Invasive Test Results: No non-invasive testing performed  Prior CABG: No previous CABG      History and Physical Interval Note:  02/24/2018 3:55 PM  Joshua Wyatt  has presented today for surgery, with the diagnosis of ua  The various methods of treatment have been discussed with the patient and family. After consideration of risks, benefits and other options for treatment, the patient has consented to  Procedure(s): LEFT HEART CATH AND CORONARY ANGIOGRAPHY (N/A) as a surgical intervention .  The patient's history has been reviewed, patient examined, no change in status, stable for surgery.  I have reviewed the patient's chart and labs.  Questions were answered to the patient's satisfaction.     [name]

## 2018-02-25 ENCOUNTER — Other Ambulatory Visit: Payer: Self-pay

## 2018-02-25 ENCOUNTER — Encounter (HOSPITAL_COMMUNITY): Payer: Self-pay | Admitting: Cardiovascular Disease

## 2018-02-25 DIAGNOSIS — I200 Unstable angina: Secondary | ICD-10-CM

## 2018-02-25 DIAGNOSIS — Z79899 Other long term (current) drug therapy: Secondary | ICD-10-CM | POA: Diagnosis not present

## 2018-02-25 DIAGNOSIS — I739 Peripheral vascular disease, unspecified: Secondary | ICD-10-CM | POA: Diagnosis not present

## 2018-02-25 DIAGNOSIS — I441 Atrioventricular block, second degree: Secondary | ICD-10-CM | POA: Diagnosis not present

## 2018-02-25 DIAGNOSIS — I25110 Atherosclerotic heart disease of native coronary artery with unstable angina pectoris: Secondary | ICD-10-CM | POA: Diagnosis not present

## 2018-02-25 DIAGNOSIS — Z8249 Family history of ischemic heart disease and other diseases of the circulatory system: Secondary | ICD-10-CM | POA: Diagnosis not present

## 2018-02-25 DIAGNOSIS — Z888 Allergy status to other drugs, medicaments and biological substances status: Secondary | ICD-10-CM | POA: Diagnosis not present

## 2018-02-25 DIAGNOSIS — Z87891 Personal history of nicotine dependence: Secondary | ICD-10-CM | POA: Diagnosis not present

## 2018-02-25 DIAGNOSIS — Z7982 Long term (current) use of aspirin: Secondary | ICD-10-CM | POA: Diagnosis not present

## 2018-02-25 DIAGNOSIS — I350 Nonrheumatic aortic (valve) stenosis: Secondary | ICD-10-CM | POA: Diagnosis not present

## 2018-02-25 DIAGNOSIS — Z7902 Long term (current) use of antithrombotics/antiplatelets: Secondary | ICD-10-CM | POA: Diagnosis not present

## 2018-02-25 DIAGNOSIS — K219 Gastro-esophageal reflux disease without esophagitis: Secondary | ICD-10-CM | POA: Diagnosis not present

## 2018-02-25 DIAGNOSIS — E782 Mixed hyperlipidemia: Secondary | ICD-10-CM | POA: Diagnosis not present

## 2018-02-25 DIAGNOSIS — I10 Essential (primary) hypertension: Secondary | ICD-10-CM | POA: Diagnosis not present

## 2018-02-25 LAB — CBC
HEMATOCRIT: 32.1 % — AB (ref 39.0–52.0)
Hemoglobin: 10.5 g/dL — ABNORMAL LOW (ref 13.0–17.0)
MCH: 28.8 pg (ref 26.0–34.0)
MCHC: 32.7 g/dL (ref 30.0–36.0)
MCV: 88.2 fL (ref 78.0–100.0)
PLATELETS: 255 10*3/uL (ref 150–400)
RBC: 3.64 MIL/uL — ABNORMAL LOW (ref 4.22–5.81)
RDW: 13.3 % (ref 11.5–15.5)
WBC: 7.5 10*3/uL (ref 4.0–10.5)

## 2018-02-25 LAB — BASIC METABOLIC PANEL
Anion gap: 10 (ref 5–15)
BUN: 13 mg/dL (ref 6–20)
CHLORIDE: 105 mmol/L (ref 101–111)
CO2: 23 mmol/L (ref 22–32)
CREATININE: 1.22 mg/dL (ref 0.61–1.24)
Calcium: 9.8 mg/dL (ref 8.9–10.3)
GFR calc Af Amer: >60 mL/min (ref >60)
GFR calc non Af Amer: 57 mL/min — ABNORMAL LOW (ref >60)
Glucose, Bld: 100 mg/dL — ABNORMAL HIGH (ref 65–99)
POTASSIUM: 3.7 mmol/L (ref 3.5–5.1)
Sodium: 138 mmol/L (ref 135–145)

## 2018-02-25 MED ORDER — AMLODIPINE BESYLATE 10 MG PO TABS: 10.0000 mg | ORAL_TABLET | Freq: Every day | ORAL | 6 refills | Status: DC

## 2018-02-25 MED ORDER — NITROGLYCERIN 0.4 MG SL SUBL: 0.4000 mg | SUBLINGUAL_TABLET | SUBLINGUAL | 12 refills | Status: DC | PRN

## 2018-02-25 MED ORDER — AMLODIPINE BESYLATE 10 MG PO TABS
10.0000 mg | ORAL_TABLET | Freq: Every day | ORAL | Status: DC
  Administered 2018-02-25: 10 mg via ORAL
  Filled 2018-02-25: qty 1

## 2018-02-25 MED FILL — Heparin Sodium (Porcine) 2 Unit/ML in Sodium Chloride 0.9%: INTRAMUSCULAR | Qty: 1000 | Status: AC

## 2018-02-25 MED FILL — Lidocaine HCl Local Inj 1%: INTRAMUSCULAR | Qty: 20 | Status: AC

## 2018-02-25 NOTE — Progress Notes (Signed)
Progress Note  Patient Name: Joshua Wyatt Date of Encounter: 02/25/2018  Primary Cardiologist: Dr. Gwenlyn Wyatt  Subjective   No recurrent chest pain. No dyspnea. Hematoma resolved.   Inpatient Medications    Scheduled Meds: . aspirin  81 mg Oral Daily  . atorvastatin  40 mg Oral QHS  . clopidogrel  75 mg Oral Daily  . diltiazem  240 mg Oral Daily  . ezetimibe  10 mg Oral Daily  . pantoprazole  40 mg Oral Daily  . sodium chloride flush  3 mL Intravenous Q12H   Continuous Infusions: . sodium chloride     PRN Meds: sodium chloride, acetaminophen, hydrALAZINE, morphine injection, ondansetron (ZOFRAN) IV, sodium chloride flush   Vital Signs    Vitals:   02/25/18 0200 02/25/18 0300 02/25/18 0430 02/25/18 0521  BP: (!) 177/73 (!) 178/73 (!) 183/79 (!) 186/72  Pulse:    (!) 57  Resp: $Remo'12 13 12 12  'JWELC$ Temp:      TempSrc:      SpO2:      Weight:    160 lb 0.9 oz (72.6 kg)  Height:        Intake/Output Summary (Last 24 hours) at 02/25/2018 0832 Last data filed at 02/25/2018 1610 Gross per 24 hour  Intake 720 ml  Output 600 ml  Net 120 ml   Filed Weights   02/24/18 1243 02/24/18 2019 02/25/18 0521  Weight: 160 lb (72.6 kg) 160 lb 0.9 oz (72.6 kg) 160 lb 0.9 oz (72.6 kg)    Telemetry    SR with type I AV block - Personally Reviewed  ECG    N/A  Physical Exam   GEN: No acute distress.   Neck: No JVD Cardiac: RRR, no murmurs, rubs, or gallops. R radial cath site without hematoma.  Respiratory: Clear to auscultation bilaterally. GI: Soft, nontender, non-distended  MS: No edema; No deformity. Neuro:  Nonfocal  Psych: Normal affect   Labs    Chemistry Recent Labs  Lab 02/21/18 1118 02/25/18 0603  NA 138 138  K 3.8 3.7  CL 102 105  CO2 21 23  GLUCOSE 91 100*  BUN 13 13  CREATININE 1.27 1.22  CALCIUM 10.0 9.8  GFRNONAA 56* 57*  GFRAA 64 >60  ANIONGAP  --  10     Hematology Recent Labs  Lab 02/21/18 1118 02/25/18 0603  WBC 6.7 7.5  RBC 3.86* 3.64*   HGB 11.2* 10.5*  HCT 33.2* 32.1*  MCV 86 88.2  MCH 29.0 28.8  MCHC 33.7 32.7  RDW 13.6 13.3  PLT 273 255   Radiology    No results Wyatt.  Cardiac Studies   LEFT HEART CATH AND CORONARY ANGIOGRAPHY  Conclusion     Ost RCA lesion is 90% stenosed.  Ost LM to Mid LM lesion is 40% stenosed.  Dist LM to Prox LAD lesion is 40% stenosed.  The left ventricular systolic function is normal.  LV end diastolic pressure is normal.  The left ventricular ejection fraction is 55-65% by visual estimate.   IMPRESSION:[name] has mild left main and proximal segmental LAD disease that did not appear to be hemodynamically significant.he has a dominant right with anterior takeoff with what appears to be a hemodynamically significant aorto ostial lesion with dampening with engagement using the AR-1 catheter. This vessel has a most a 40-50% mid stenosis. He had normal LV function. I have reviewed his interventions with Dr. Tamala Julian and we both agree that this ostial RCA appears to  be significant and probably requires percutaneous revascularization which would optimally be done via the femoral approach. The sheath was removed and a TR band was placed on the right breast to achieve patent hemostasis. The patient left the lab in stable condition. He'll be discharged home today as an outpatient and will see me back in the office later this week at which point we will arrange for him to undergo RCA aorto ostial stenting.   Diagnostic Diagram         Patient Profile     72 y.o. male with a past medial history significant for PAD with prior angioplasty to the left SFA, renal artery disease with prior renal artery stenting, aortic stenosis, hypertension, dyslipidemia and cerebral atherosclerosis presented for outpatient cath for unstable angina.   Assessment & Plan    1. Unstable angina - Cath report as above. Showed 40%  left main and proximal segmental LAD. Plan to outpatient PCI of 90% ostial  RCA lesion. No recurrent chest pain.  - Continue ASA, plavix and statin.   2. HLD - 02/03/2018: Cholesterol, Total 155; HDL 36; LDL Calculated 77; Triglycerides 212  - Managed by Joshua Wyatt. Continue lipitor $RemoveBeforeDE'40mg'[id number]$  and Zetia $RemoveB'10mg'zAjqPKDL$ .   3. Sinus Bradycardia - Telemetry showed 2nd AV block type I. He takes Cardizem $RemoveBefore'240mg'xWrvMzRpezsgx$  for HTN.  4. HTN - BP elevated. Consider adding another agent.   For questions or updates, please contact [place] Please consult [email or web address] for contact info under Cardiology/STEMI.      Joshua Soho, PA  02/25/2018, 8:32 AM

## 2018-02-25 NOTE — Progress Notes (Signed)
[facility] BAND REMOVAL  LOCATION:    right radial  DEFLATED PER PROTOCOL:    Yes.    TIME BAND OFF / DRESSING APPLIED:    2130   SITE UPON ARRIVAL:    Level 0  SITE AFTER BAND REMOVAL:    Level 1  CIRCULATION SENSATION AND MOVEMENT:    Within Normal Limits   Yes.    COMMENTS:   Pt.tolerated procedure well.

## 2018-02-25 NOTE — Discharge Summary (Signed)
Discharge Summary    Patient ID: Joshua Wyatt,  MRN: [id number], DOB/AGE: Mar 03, 1946 72 y.o.  Admit date: 02/24/2018 Discharge date: 02/25/2018  Primary Care Provider: Jani Gravel Primary Cardiologist: Dr. Gwenlyn Found   Discharge Diagnoses    Principal Problem:   Unstable angina [facility]) Active Problems:   PVD (peripheral vascular disease) ([place])   Mixed hyperlipidemia   Uncontrolled hypertension   Status post left heart catheterization   2nd degree AV block   Allergies Allergies  Allergen Reactions  . Statins Swelling    Muscle pain    Diagnostic Studies/Procedures    LEFT HEART CATH AND CORONARY ANGIOGRAPHY  Conclusion     Ost RCA lesion is 90% stenosed.  Ost LM to Mid LM lesion is 40% stenosed.  Dist LM to Prox LAD lesion is 40% stenosed.  The left ventricular systolic function is normal.  LV end diastolic pressure is normal.  The left ventricular ejection fraction is 55-65% by visual estimate.  IMPRESSION:Joshua Wyatt has mild left main and proximal segmental LAD disease that did not appear to be hemodynamically significant.he has a dominant right with anterior takeoff with what appears to be a hemodynamically significant aorto ostial lesion with dampening with engagement using the AR-1 catheter. This vessel has a most a 40-50% mid stenosis. He had normal LV function. I have reviewed his interventions with Dr. Tamala Julian and we both agree that this ostial RCA appears to be significant and probably requires percutaneous revascularization which would optimally be done via the femoral approach. The sheath was removed and a TR band was placed on the right breast to achieve patent hemostasis. The patient left the lab in stable condition. He'll be discharged home today as an outpatient and will see me back in the office later this week at which point we will arrange for him to undergo RCA aorto ostial stenting.   Diagnostic Diagram         History of Present Illness       72 y.o. male with a past medial history significant for PAD with prior angioplasty to the left SFA, renal artery disease with prior renal artery stenting, aortic stenosis, hypertension, dyslipidemia and cerebral atherosclerosis presented for outpatient cath for unstable angina.   Hospital Course     Consultants: None  1. Unstable angina - Cath report as above. Showed 40%  left main and proximal segmental LAD. Plan for outpatient PCI of 90% ostial RCA lesion. No recurrent chest pain.  - Continue ASA, plavix and statin. Will give PRN nitro.   2. HLD - 02/03/2018: Cholesterol, Total 155; HDL 36; LDL Calculated 77; Triglycerides 212  - Managed by Dr. Debara Pickett. Continue lipitor $RemoveBeforeDE'40mg'WPalIdMUuVQKElw$  and Zetia $RemoveB'10mg'QToJLSqT$ .   3. Sinus Bradycardia/Wenkebach - Telemetry showed 2nd AV block type I. He takes Cardizem $RemoveBefore'240mg'sCfiqsJRKXADr$  for HTN. Will stop.   4. HTN - BP elevated. Stop Cardizem as above. Add amlodipine $RemoveBefore'10mg'ECeEPAoHFgohF$  and resume home Avapro. Follow BP closely.   The patient has been seen by Dr. Debara Pickett today and deemed ready for discharge home. All follow-up appointments have been scheduled. Discharge medications are listed below.  _____________   Discharge Vitals Blood pressure (!) 158/76, pulse 61, temperature 97.6 F (36.4 C), temperature source Oral, resp. rate 14, height $RemoveBe'5\' 7"'bjqzUfgNY$  (1.702 m), weight 160 lb 0.9 oz (72.6 kg), SpO2 96 %.  Filed Weights   02/24/18 1243 02/24/18 2019 02/25/18 0521  Weight: 160 lb (72.6 kg) 160 lb 0.9 oz (72.6 kg) 160 lb 0.9 oz (72.6  kg)    Labs & Radiologic Studies     CBC Recent Labs    02/25/18 0603  WBC 7.5  HGB 10.5*  HCT 32.1*  MCV 88.2  PLT 563   Basic Metabolic Panel Recent Labs    02/25/18 0603  NA 138  K 3.7  CL 105  CO2 23  GLUCOSE 100*  BUN 13  CREATININE 1.22  CALCIUM 9.8    No results found.  Disposition   Pt is being discharged home today in good condition.  Follow-up Plans & Appointments    Follow-up Information    Lorretta Harp, MD. Go on  03/12/2018.   Specialties:  Cardiology, Radiology Why:  $Rem'@10'VmwE$ :45am for post hospital follow up Contact information: [address] [place] [address] [phone]          Discharge Instructions    Diet - low sodium heart healthy   Complete by:  As directed    Discharge instructions   Complete by:  As directed    No driving for 48 hours. No lifting over 5 lbs for 1 week. No sexual activity for 1 week.  Keep procedure site clean & dry. If you notice increased pain, swelling, bleeding or pus, call/return!  You may shower, but no soaking baths/hot tubs/pools for 1 week.   Increase activity slowly   Complete by:  As directed       Discharge Medications   Allergies as of 02/25/2018      Reactions   Statins Swelling   Muscle pain      Medication List    STOP taking these medications   DILT-XR 240 MG 24 hr capsule Generic drug:  diltiazem     TAKE these medications   amLODipine 10 MG tablet Commonly known as:  NORVASC Take 1 tablet (10 mg total) by mouth daily.   aspirin 81 MG tablet Take 81 mg by mouth daily.   atorvastatin 40 MG tablet Commonly known as:  LIPITOR Take 40 mg by mouth at bedtime.   clopidogrel 75 MG tablet Commonly known as:  PLAVIX TAKE 1 TABLET BY MOUTH ONCE DAILY   ezetimibe 10 MG tablet Commonly known as:  ZETIA Take 1 tablet (10 mg total) by mouth daily.   ibuprofen 200 MG tablet Commonly known as:  ADVIL,MOTRIN Take 200-400 mg by mouth every 8 (eight) hours as needed (for pain.).   irbesartan 300 MG tablet Commonly known as:  AVAPRO Take 1 tablet (300 mg total) by mouth daily. What changed:  when to take this   nitroGLYCERIN 0.4 MG SL tablet Commonly known as:  NITROSTAT Place 1 tablet (0.4 mg total) under the tongue every 5 (five) minutes as needed.   pantoprazole 40 MG tablet Commonly known as:  PROTONIX Take 1 tablet (40 mg total) by mouth daily.   tamsulosin 0.4 MG Caps capsule Commonly known as:  FLOMAX Take  0.4 mg by mouth at bedtime.        Outstanding Labs/Studies   None  Duration of Discharge Encounter   Greater than 30 minutes including physician time.  Signed, Crista Luria Breven Guidroz PA-C 02/25/2018, 9:49 AM

## 2018-03-12 ENCOUNTER — Ambulatory Visit (INDEPENDENT_AMBULATORY_CARE_PROVIDER_SITE_OTHER): Payer: Medicare Other | Admitting: Cardiovascular Disease

## 2018-03-12 ENCOUNTER — Encounter: Payer: Self-pay | Admitting: Cardiovascular Disease

## 2018-03-12 ENCOUNTER — Other Ambulatory Visit: Payer: Self-pay | Admitting: Cardiovascular Disease

## 2018-03-12 DIAGNOSIS — I200 Unstable angina: Secondary | ICD-10-CM

## 2018-03-12 LAB — BASIC METABOLIC PANEL
BUN/Creatinine Ratio: 10 (ref 10–24)
BUN: 13 mg/dL (ref 8–27)
CALCIUM: 10.2 mg/dL (ref 8.6–10.2)
CO2: 20 mmol/L (ref 20–29)
CREATININE: 1.28 mg/dL — AB (ref 0.76–1.27)
Chloride: 100 mmol/L (ref 96–106)
GFR calc Af Amer: 64 mL/min/{1.73_m2} (ref >59)
GFR, EST NON AFRICAN AMERICAN: 55 mL/min/{1.73_m2} — AB (ref >59)
GLUCOSE: 88 mg/dL (ref 65–99)
POTASSIUM: 4.3 mmol/L (ref 3.5–5.2)
Sodium: 136 mmol/L (ref 134–144)

## 2018-03-12 LAB — CBC WITH DIFFERENTIAL/PLATELET
BASOS ABS: 0 10*3/uL (ref 0.0–0.2)
BASOS: 0 %
EOS (ABSOLUTE): 0.1 10*3/uL (ref 0.0–0.4)
Eos: 2 %
Hematocrit: 35.9 % — ABNORMAL LOW (ref 37.5–51.0)
Hemoglobin: 12.1 g/dL — ABNORMAL LOW (ref 13.0–17.7)
IMMATURE GRANS (ABS): 0 10*3/uL (ref 0.0–0.1)
IMMATURE GRANULOCYTES: 0 %
LYMPHS: 22 %
Lymphocytes Absolute: 1.7 10*3/uL (ref 0.7–3.1)
MCH: 29.4 pg (ref 26.6–33.0)
MCHC: 33.7 g/dL (ref 31.5–35.7)
MCV: 87 fL (ref 79–97)
MONOS ABS: 0.7 10*3/uL (ref 0.1–0.9)
Monocytes: 9 %
Neutrophils Absolute: 5.1 10*3/uL (ref 1.4–7.0)
Neutrophils: 67 %
PLATELETS: 301 10*3/uL (ref 150–379)
RBC: 4.12 x10E6/uL — AB (ref 4.14–5.80)
RDW: 13.6 % (ref 12.3–15.4)
WBC: 7.6 10*3/uL (ref 3.4–10.8)

## 2018-03-12 LAB — PROTIME-INR
INR: 1 (ref 0.8–1.2)
PROTHROMBIN TIME: 10.5 s (ref 9.1–12.0)

## 2018-03-12 LAB — APTT: APTT: 29 s (ref 24–33)

## 2018-03-12 NOTE — Patient Instructions (Signed)
   [facility] [address] [place] [address] Dept: [phone] Loc: [phone]  Joshua Wyatt  03/12/2018  You are scheduled for a Cardiac Catheterization on Thursday, April 4 with Dr. [name].  1. Please arrive at the [facility] (Main Entrance A) at [facility]: [address] [place], [place] [address] at 11:00 AM (two hours before your procedure to ensure your preparation). Free valet parking service is available.   Special note: Every effort is made to have your procedure done on time. Please understand that emergencies sometimes delay scheduled procedures.  2. Diet: Do not eat or drink anything after midnight prior to your procedure except sips of water to take medications.  3. Labs: Please have labs drawn in our office today.  4. Medication instructions in preparation for your procedure:  On the morning of your procedure, take your aspirin and Plavix/Clopidogrel and any morning medicines NOT listed above.  You may use sips of water.  5. Plan for one night stay--bring personal belongings. 6. Bring a current list of your medications and current insurance cards. 7. You MUST have a responsible person to drive you home. 8. Someone MUST be with you the first 24 hours after you arrive home or your discharge will be delayed. 9. Please wear clothes that are easy to get on and off and wear slip-on shoes.  Thank you for allowing [place] to care for you!   -- [facility]   Post-procedure Follow-Up:  Your physician recommends that you schedule a follow-up appointment in: 1-2 weeks with Dr. Gwenlyn Found after procedure.

## 2018-03-12 NOTE — H&P (View-Only) (Signed)
03/12/2018 Joshua Wyatt   1946/08/30  [id number]  Primary Physician Jani Gravel, MD Primary Cardiologist: Lorretta Harp MD FACP, [place], [place], [place]  HPI:  Joshua Wyatt is a 72 y.o.  thin appearing married Caucasian male with no children who I last saw in the office 10/09/17.[name] He has a history of PVOD status post bilateral carotid endarterectomies performed by Dr. Drucie Opitz in 2007. We have been following him by duplex ultrasound which performed most recently several days ago revealed this to be widely patent. He has also had bilateral SFA intervention as well as right renal artery stenting. His other problems include hypertension and hyperlipidemia. He did have an episode of chest heaviness recently, but more importantly he has complained of progressive lifestyle limiting claudication since I last saw him. Most recent lower extremity Dopplers reveal a right ABI of 0.93 and a left of 0.82. He does appear to have moderate iliac disease as well as high grade right SFA and popliteal stenosis. He underwent abdominal aortography with bifemoral runoff on 03/30/13 revealed a widely patent right renal artery stent, 50-60% proximal left renal artery stenosis. The left SFA was while he stayed patent and he had a 95% focal above-the-knee popliteal artery stenosis with three-vessel runoff. There was a 60% focal lesion in the right common iliac artery and a 50% segmental proximal to mid right SFA stenosis with a patent stent in the mid to distal right SFA. He also had a 50% right popliteal stenosis with three-vessel runoff. He underwent cutting balloon angioplasty of the left above-the-knee popliteal artery and Angiosculpt balloon with an excellent angiographic result and was discharged the following day. I performed angiography on him 10/11/14 and recanalized a subtotally occluded right SFA with overlapping via Bahn covered stent performed intervention on his above-the-knee popliteal artery on that side. He had a  patent mid to distal left SFA stent with high grade segmental and 6 sequential disease in the proximal SFA and distal SFA on the left. Since I saw him a year and a half ago he's had progressive lifestyle limiting claudication in his left calf. Addition, he's had severe progression of disease in his right internal carotid artery. I performed carotid stenting on him 01/03/17 with an excellent result. He underwent left lower extremity angiography by myself 02/03/17 revealing an occluded distal left SFA stent with otherwise high-grade segmental disease proximal to this and occluded anterior tibial. I performed cutting balloon atherectomy of his in-stent restenosis and the surrounding disease excellent angiographic result. His claudication has completely resolved. His left ABI has improved from 0.59 up to 0.  He was admitted to the hospital for accelerated unstable angina on 02/24/18 performed left heart cath via the right radial approach revealing a high-grade ostial dominant RCA stenosis anterior takeoff only visualized using an AR-1 catheter. I do not think this was percutaneous lead rest will be the radial approach. He is admitted now for RCA intervention via the right femoral approach.   Current Meds  Medication Sig  . amLODipine (NORVASC) 10 MG tablet Take 1 tablet (10 mg total) by mouth daily.  [name] aspirin 81 MG tablet Take 81 mg by mouth daily.  [name] atorvastatin (LIPITOR) 40 MG tablet Take 40 mg by mouth at bedtime.   . clopidogrel (PLAVIX) 75 MG tablet TAKE 1 TABLET BY MOUTH ONCE DAILY  . ezetimibe (ZETIA) 10 MG tablet Take 1 tablet (10 mg total) by mouth daily.  [name] ibuprofen (ADVIL,MOTRIN) 200 MG tablet Take 200-400  mg by mouth every 8 (eight) hours as needed (for pain.).  [name] irbesartan (AVAPRO) 300 MG tablet Take 1 tablet (300 mg total) by mouth daily. (Patient taking differently: Take 300 mg by mouth at bedtime. )  . nitroGLYCERIN (NITROSTAT) 0.4 MG SL tablet Place 1 tablet (0.4 mg total) under the  tongue every 5 (five) minutes as needed.  . pantoprazole (PROTONIX) 40 MG tablet Take 1 tablet (40 mg total) by mouth daily.  . tamsulosin (FLOMAX) 0.4 MG CAPS capsule Take 0.4 mg by mouth at bedtime.      Allergies  Allergen Reactions  . Statins Swelling    Muscle pain    Social History   Socioeconomic History  . Marital status: Married    Spouse name: Not on file  . Number of children: Not on file  . Years of education: Not on file  . Highest education level: Not on file  Occupational History  . Not on file  Social Needs  . Financial resource strain: Not on file  . Food insecurity:    Worry: Not on file    Inability: Not on file  . Transportation needs:    Medical: Not on file    Non-medical: Not on file  Tobacco Use  . Smoking status: Former Smoker    Years: 58.00    Types: Cigars, Cigarettes    Last attempt to quit: 10/18/2013    Years since quitting: 4.4  . Smokeless tobacco: Never Used  Substance and Sexual Activity  . Alcohol use: No  . Drug use: No  . Sexual activity: Yes    Comment: RARE  CIGAR  Lifestyle  . Physical activity:    Days per week: Not on file    Minutes per session: Not on file  . Stress: Not on file  Relationships  . Social connections:    Talks on phone: Not on file    Gets together: Not on file    Attends religious service: Not on file    Active member of club or organization: Not on file    Attends meetings of clubs or organizations: Not on file    Relationship status: Not on file  . Intimate partner violence:    Fear of current or ex partner: Not on file    Emotionally abused: Not on file    Physically abused: Not on file    Forced sexual activity: Not on file  Other Topics Concern  . Not on file  Social History Narrative  . Not on file     Review of Systems: General: negative for chills, fever, night sweats or weight changes.  Cardiovascular: negative for chest pain, dyspnea on exertion, edema, orthopnea, palpitations,  paroxysmal nocturnal dyspnea or shortness of breath Dermatological: negative for rash Respiratory: negative for cough or wheezing Urologic: negative for hematuria Abdominal: negative for nausea, vomiting, diarrhea, bright red blood per rectum, melena, or hematemesis Neurologic: negative for visual changes, syncope, or dizziness All other systems reviewed and are otherwise negative except as noted above.    Blood pressure 138/70, pulse 84, height 5' 7.5" (1.715 m), weight 158 lb (71.7 kg).  General appearance: alert and no distress Neck: no adenopathy, no carotid bruit, no JVD, supple, symmetrical, trachea midline and thyroid not enlarged, symmetric, no tenderness/mass/nodules Lungs: clear to auscultation bilaterally Heart: regular rate and rhythm, S1, S2 normal, no murmur, click, rub or gallop Extremities: extremities normal, atraumatic, no cyanosis or edema Pulses: 2+ and symmetric Skin: Skin color, texture, turgor normal.  No rashes or lesions Neurologic: Alert and oriented X 3, normal strength and tone. Normal symmetric reflexes. Normal coordination and gait  EKG not performed today  ASSESSMENT AND PLAN:   Unstable angina [facility]) [name] had a left heart cath by myself via the right radial approach 02/24/18 because of exertional angina. This revealed normal LV function, 40% left main with a 90% ostial RCA with anterior takeoff. I was only able to engage this with an AR-1 catheter from the wrist. He does have reproducible exertional angina. I did review his angiogram to Dr. Tamala Julian who agrees that percutaneous vascular station is indicated. We'll proceed with this sometime next week. The right femoral approach.      Lorretta Harp MD FACP,FACC,FAHA, [facility] 03/12/2018 11:30 AM

## 2018-03-12 NOTE — Assessment & Plan Note (Signed)
[name] had a left heart cath by myself via the right radial approach 02/24/18 because of exertional angina. This revealed normal LV function, 40% left main with a 90% ostial RCA with anterior takeoff. I was only able to engage this with an AR-1 catheter from the wrist. He does have reproducible exertional angina. I did review his angiogram to Dr. Tamala Julian who agrees that percutaneous vascular station is indicated. We'll proceed with this sometime next week. The right femoral approach.

## 2018-03-12 NOTE — Progress Notes (Signed)
03/12/2018 MAXIMOS ZAYAS   [birth year]  [id number]  Primary Physician Jani Gravel, MD Primary Cardiologist: Lorretta Harp MD FACP, [place], [place], [place]  HPI:  YIANNI SKILLING is a 72 y.o.  thin appearing married Caucasian male with no children who I last saw in the office 10/09/17.[name] He has a history of PVOD status post bilateral carotid endarterectomies performed by Dr. Drucie Opitz in 2007. We have been following him by duplex ultrasound which performed most recently several days ago revealed this to be widely patent. He has also had bilateral SFA intervention as well as right renal artery stenting. His other problems include hypertension and hyperlipidemia. He did have an episode of chest heaviness recently, but more importantly he has complained of progressive lifestyle limiting claudication since I last saw him. Most recent lower extremity Dopplers reveal a right ABI of 0.93 and a left of 0.82. He does appear to have moderate iliac disease as well as high grade right SFA and popliteal stenosis. He underwent abdominal aortography with bifemoral runoff on 03/30/13 revealed a widely patent right renal artery stent, 50-60% proximal left renal artery stenosis. The left SFA was while he stayed patent and he had a 95% focal above-the-knee popliteal artery stenosis with three-vessel runoff. There was a 60% focal lesion in the right common iliac artery and a 50% segmental proximal to mid right SFA stenosis with a patent stent in the mid to distal right SFA. He also had a 50% right popliteal stenosis with three-vessel runoff. He underwent cutting balloon angioplasty of the left above-the-knee popliteal artery and Angiosculpt balloon with an excellent angiographic result and was discharged the following day. I performed angiography on him 10/11/14 and recanalized a subtotally occluded right SFA with overlapping via Bahn covered stent performed intervention on his above-the-knee popliteal artery on that side. He had a  patent mid to distal left SFA stent with high grade segmental and 6 sequential disease in the proximal SFA and distal SFA on the left. Since I saw him a year and a half ago he's had progressive lifestyle limiting claudication in his left calf. Addition, he's had severe progression of disease in his right internal carotid artery. I performed carotid stenting on him 01/03/17 with an excellent result. He underwent left lower extremity angiography by myself 02/03/17 revealing an occluded distal left SFA stent with otherwise high-grade segmental disease proximal to this and occluded anterior tibial. I performed cutting balloon atherectomy of his in-stent restenosis and the surrounding disease excellent angiographic result. His claudication has completely resolved. His left ABI has improved from 0.59 up to 0.  He was admitted to the hospital for accelerated unstable angina on 02/24/18 performed left heart cath via the right radial approach revealing a high-grade ostial dominant RCA stenosis anterior takeoff only visualized using an AR-1 catheter. I do not think this was percutaneous lead rest will be the radial approach. He is admitted now for RCA intervention via the right femoral approach.   Current Meds  Medication Sig  . amLODipine (NORVASC) 10 MG tablet Take 1 tablet (10 mg total) by mouth daily.  [name] aspirin 81 MG tablet Take 81 mg by mouth daily.  [name] atorvastatin (LIPITOR) 40 MG tablet Take 40 mg by mouth at bedtime.   . clopidogrel (PLAVIX) 75 MG tablet TAKE 1 TABLET BY MOUTH ONCE DAILY  . ezetimibe (ZETIA) 10 MG tablet Take 1 tablet (10 mg total) by mouth daily.  [name] ibuprofen (ADVIL,MOTRIN) 200 MG tablet Take 200-400  mg by mouth every 8 (eight) hours as needed (for pain.).  [name] irbesartan (AVAPRO) 300 MG tablet Take 1 tablet (300 mg total) by mouth daily. (Patient taking differently: Take 300 mg by mouth at bedtime. )  . nitroGLYCERIN (NITROSTAT) 0.4 MG SL tablet Place 1 tablet (0.4 mg total) under the  tongue every 5 (five) minutes as needed.  . pantoprazole (PROTONIX) 40 MG tablet Take 1 tablet (40 mg total) by mouth daily.  . tamsulosin (FLOMAX) 0.4 MG CAPS capsule Take 0.4 mg by mouth at bedtime.      Allergies  Allergen Reactions  . Statins Swelling    Muscle pain    Social History   Socioeconomic History  . Marital status: Married    Spouse name: Not on file  . Number of children: Not on file  . Years of education: Not on file  . Highest education level: Not on file  Occupational History  . Not on file  Social Needs  . Financial resource strain: Not on file  . Food insecurity:    Worry: Not on file    Inability: Not on file  . Transportation needs:    Medical: Not on file    Non-medical: Not on file  Tobacco Use  . Smoking status: Former Smoker    Years: 58.00    Types: Cigars, Cigarettes    Last attempt to quit: 10/18/2013    Years since quitting: 4.4  . Smokeless tobacco: Never Used  Substance and Sexual Activity  . Alcohol use: No  . Drug use: No  . Sexual activity: Yes    Comment: RARE  CIG[employer]  Lifestyle  . Physical activity:    Days per week: Not on file    Minutes per session: Not on file  . Stress: Not on file  Relationships  . Social connections:    Talks on phone: Not on file    Gets together: Not on file    Attends religious service: Not on file    Active member of club or organization: Not on file    Attends meetings of clubs or organizations: Not on file    Relationship status: Not on file  . Intimate partner violence:    Fear of current or ex partner: Not on file    Emotionally abused: Not on file    Physically abused: Not on file    Forced sexual activity: Not on file  Other Topics Concern  . Not on file  Social History Narrative  . Not on file     Review of Systems: General: negative for chills, fever, night sweats or weight changes.  Cardiovascular: negative for chest pain, dyspnea on exertion, edema, orthopnea, palpitations,  paroxysmal nocturnal dyspnea or shortness of breath Dermatological: negative for rash Respiratory: negative for cough or wheezing Urologic: negative for hematuria Abdominal: negative for nausea, vomiting, diarrhea, bright red blood per rectum, melena, or hematemesis Neurologic: negative for visual changes, syncope, or dizziness All other systems reviewed and are otherwise negative except as noted above.    Blood pressure 138/70, pulse 84, height 5' 7.5" (1.715 m), weight 158 lb (71.7 kg).  General appearance: alert and no distress Neck: no adenopathy, no carotid bruit, no JVD, supple, symmetrical, trachea midline and thyroid not enlarged, symmetric, no tenderness/mass/nodules Lungs: clear to auscultation bilaterally Heart: regular rate and rhythm, S1, S2 normal, no murmur, click, rub or gallop Extremities: extremities normal, atraumatic, no cyanosis or edema Pulses: 2+ and symmetric Skin: Skin color, texture, turgor normal.  No rashes or lesions Neurologic: Alert and oriented X 3, normal strength and tone. Normal symmetric reflexes. Normal coordination and gait  EKG not performed today  ASSESSMENT AND PLAN:   Unstable angina [facility]) Mr. Zenia Resides had a left heart cath by myself via the right radial approach 02/24/18 because of exertional angina. This revealed normal LV function, 40% left main with a 90% ostial RCA with anterior takeoff. I was only able to engage this with an AR-1 catheter from the wrist. He does have reproducible exertional angina. I did review his angiogram to Dr. Tamala Julian who agrees that percutaneous vascular station is indicated. We'll proceed with this sometime next week. The right femoral approach.      Lorretta Harp MD FACP,FACC,FAHA, [facility] 03/12/2018 11:30 AM

## 2018-03-13 ENCOUNTER — Other Ambulatory Visit: Payer: Self-pay

## 2018-03-13 ENCOUNTER — Ambulatory Visit (HOSPITAL_COMMUNITY)
Admission: RE | Admit: 2018-03-13 | Discharge: 2018-03-14 | Disposition: A | Discharge status: Home / Self Care | Payer: Medicare Other | Source: Ambulatory Visit | Attending: Cardiovascular Disease | Admitting: Cardiovascular Disease

## 2018-03-13 ENCOUNTER — Ambulatory Visit (HOSPITAL_COMMUNITY)
Admission: RE | Disposition: A | Discharge status: Home / Self Care | Payer: Self-pay | Source: Ambulatory Visit | Attending: Cardiovascular Disease

## 2018-03-13 ENCOUNTER — Encounter (HOSPITAL_COMMUNITY): Payer: Self-pay | Admitting: General Practice

## 2018-03-13 DIAGNOSIS — E785 Hyperlipidemia, unspecified: Secondary | ICD-10-CM | POA: Insufficient documentation

## 2018-03-13 DIAGNOSIS — Z87891 Personal history of nicotine dependence: Secondary | ICD-10-CM | POA: Insufficient documentation

## 2018-03-13 DIAGNOSIS — I10 Essential (primary) hypertension: Secondary | ICD-10-CM | POA: Insufficient documentation

## 2018-03-13 DIAGNOSIS — I701 Atherosclerosis of renal artery: Secondary | ICD-10-CM | POA: Diagnosis not present

## 2018-03-13 DIAGNOSIS — I25110 Atherosclerotic heart disease of native coronary artery with unstable angina pectoris: Secondary | ICD-10-CM

## 2018-03-13 DIAGNOSIS — I2510 Atherosclerotic heart disease of native coronary artery without angina pectoris: Secondary | ICD-10-CM | POA: Diagnosis present

## 2018-03-13 DIAGNOSIS — I70201 Unspecified atherosclerosis of native arteries of extremities, right leg: Secondary | ICD-10-CM | POA: Insufficient documentation

## 2018-03-13 DIAGNOSIS — Z7902 Long term (current) use of antithrombotics/antiplatelets: Secondary | ICD-10-CM | POA: Insufficient documentation

## 2018-03-13 DIAGNOSIS — I200 Unstable angina: Secondary | ICD-10-CM

## 2018-03-13 DIAGNOSIS — Z7982 Long term (current) use of aspirin: Secondary | ICD-10-CM | POA: Diagnosis not present

## 2018-03-13 DIAGNOSIS — Z955 Presence of coronary angioplasty implant and graft: Secondary | ICD-10-CM

## 2018-03-13 HISTORY — PX: CORONARY STENT INTERVENTION: CATH118234

## 2018-03-13 HISTORY — PX: CORONARY ANGIOPLASTY WITH STENT PLACEMENT: SHX49

## 2018-03-13 HISTORY — DX: Personal history of urinary calculi: Z87.442

## 2018-03-13 LAB — POCT ACTIVATED CLOTTING TIME: ACTIVATED CLOTTING TIME: 335 s

## 2018-03-13 SURGERY — CORONARY STENT INTERVENTION
Anesthesia: LOCAL

## 2018-03-13 MED ORDER — SODIUM CHLORIDE 0.9% FLUSH
3.0000 mL | Freq: Two times a day (BID) | INTRAVENOUS | Status: DC
  Administered 2018-03-13 – 2018-03-14 (×2): 3 mL via INTRAVENOUS

## 2018-03-13 MED ORDER — SODIUM CHLORIDE 0.9% FLUSH: 3.0000 mL | INTRAVENOUS | Status: DC | PRN

## 2018-03-13 MED ORDER — BIVALIRUDIN TRIFLUOROACETATE 250 MG IV SOLR
INTRAVENOUS | Status: AC
  Filled 2018-03-13: qty 250

## 2018-03-13 MED ORDER — SODIUM CHLORIDE 0.9 % WEIGHT BASED INFUSION
3.0000 mL/kg/h | INTRAVENOUS | Status: DC
  Administered 2018-03-13: 3 mL/kg/h via INTRAVENOUS

## 2018-03-13 MED ORDER — IOPAMIDOL (ISOVUE-370) INJECTION 76%
INTRAVENOUS | Status: DC | PRN
  Administered 2018-03-13: 115 mL via INTRA_ARTERIAL

## 2018-03-13 MED ORDER — ASPIRIN 81 MG PO CHEW: 81.0000 mg | CHEWABLE_TABLET | Freq: Every day | ORAL | Status: DC

## 2018-03-13 MED ORDER — LABETALOL HCL 5 MG/ML IV SOLN
10.0000 mg | INTRAVENOUS | Status: AC | PRN
  Administered 2018-03-13 (×2): 10 mg via INTRAVENOUS
  Filled 2018-03-13: qty 4

## 2018-03-13 MED ORDER — ANGIOPLASTY BOOK
Freq: Once | Status: DC
  Filled 2018-03-13: qty 1

## 2018-03-13 MED ORDER — IOPAMIDOL (ISOVUE-370) INJECTION 76%
INTRAVENOUS | Status: AC
  Filled 2018-03-13: qty 125

## 2018-03-13 MED ORDER — ASPIRIN EC 81 MG PO TBEC
81.0000 mg | DELAYED_RELEASE_TABLET | Freq: Every day | ORAL | Status: DC
  Administered 2018-03-14: 10:00:00 81 mg via ORAL
  Filled 2018-03-13: qty 1

## 2018-03-13 MED ORDER — PANTOPRAZOLE SODIUM 40 MG PO TBEC
40.0000 mg | DELAYED_RELEASE_TABLET | Freq: Every day | ORAL | Status: DC
  Administered 2018-03-14: 40 mg via ORAL
  Filled 2018-03-13: qty 1

## 2018-03-13 MED ORDER — LIDOCAINE HCL (PF) 1 % IJ SOLN
INTRAMUSCULAR | Status: DC | PRN
  Administered 2018-03-13: 18 mL

## 2018-03-13 MED ORDER — HEPARIN (PORCINE) IN NACL 2-0.9 UNIT/ML-% IJ SOLN
INTRAMUSCULAR | Status: AC | PRN
  Administered 2018-03-13: 500 mL

## 2018-03-13 MED ORDER — NITROGLYCERIN 1 MG/10 ML FOR IR/CATH LAB
INTRA_ARTERIAL | Status: AC
  Filled 2018-03-13: qty 10

## 2018-03-13 MED ORDER — NITROGLYCERIN 0.4 MG SL SUBL: 0.4000 mg | SUBLINGUAL_TABLET | SUBLINGUAL | Status: DC | PRN

## 2018-03-13 MED ORDER — ATROPINE SULFATE 1 MG/10ML IJ SOSY
PREFILLED_SYRINGE | INTRAMUSCULAR | Status: AC
  Filled 2018-03-13: qty 10

## 2018-03-13 MED ORDER — SODIUM CHLORIDE 0.9 % IV SOLN
INTRAVENOUS | Status: DC | PRN
  Administered 2018-03-13: 1.75 mg/kg/h via INTRAVENOUS

## 2018-03-13 MED ORDER — CLOPIDOGREL BISULFATE 300 MG PO TABS
ORAL_TABLET | ORAL | Status: DC | PRN
  Administered 2018-03-13: 300 mg via ORAL

## 2018-03-13 MED ORDER — SODIUM CHLORIDE 0.9 % WEIGHT BASED INFUSION: 1.0000 mL/kg/h | INTRAVENOUS | Status: DC

## 2018-03-13 MED ORDER — TAMSULOSIN HCL 0.4 MG PO CAPS
0.4000 mg | ORAL_CAPSULE | Freq: Every day | ORAL | Status: DC
  Administered 2018-03-13: 0.4 mg via ORAL
  Filled 2018-03-13: qty 1

## 2018-03-13 MED ORDER — EZETIMIBE 10 MG PO TABS
10.0000 mg | ORAL_TABLET | Freq: Every day | ORAL | Status: DC
  Administered 2018-03-14: 10 mg via ORAL
  Filled 2018-03-13: qty 1

## 2018-03-13 MED ORDER — MIDAZOLAM HCL 2 MG/2ML IJ SOLN
INTRAMUSCULAR | Status: AC
  Filled 2018-03-13: qty 2

## 2018-03-13 MED ORDER — SODIUM CHLORIDE 0.9 % IV SOLN: INTRAVENOUS | Status: AC

## 2018-03-13 MED ORDER — IOHEXOL 350 MG/ML SOLN
INTRAVENOUS | Status: DC | PRN
  Administered 2018-03-13: 30 mL via INTRA_ARTERIAL

## 2018-03-13 MED ORDER — LIDOCAINE HCL 1 % IJ SOLN
INTRAMUSCULAR | Status: AC
  Filled 2018-03-13: qty 20

## 2018-03-13 MED ORDER — MIDAZOLAM HCL 2 MG/2ML IJ SOLN
INTRAMUSCULAR | Status: DC | PRN
  Administered 2018-03-13: 1 mg via INTRAVENOUS

## 2018-03-13 MED ORDER — MORPHINE SULFATE (PF) 2 MG/ML IV SOLN: 2.0000 mg | INTRAVENOUS | Status: DC | PRN

## 2018-03-13 MED ORDER — HEPARIN (PORCINE) IN NACL 2-0.9 UNIT/ML-% IJ SOLN
INTRAMUSCULAR | Status: AC
  Filled 2018-03-13: qty 1000

## 2018-03-13 MED ORDER — FENTANYL CITRATE (PF) 100 MCG/2ML IJ SOLN
INTRAMUSCULAR | Status: DC | PRN
  Administered 2018-03-13: 25 ug via INTRAVENOUS

## 2018-03-13 MED ORDER — CLOPIDOGREL BISULFATE 75 MG PO TABS
75.0000 mg | ORAL_TABLET | Freq: Every day | ORAL | Status: DC
  Administered 2018-03-14: 75 mg via ORAL
  Filled 2018-03-13: qty 1

## 2018-03-13 MED ORDER — IRBESARTAN 300 MG PO TABS
300.0000 mg | ORAL_TABLET | Freq: Every day | ORAL | Status: DC
  Administered 2018-03-13 – 2018-03-14 (×2): 300 mg via ORAL
  Filled 2018-03-13 (×2): qty 1

## 2018-03-13 MED ORDER — CLOPIDOGREL BISULFATE 75 MG PO TABS: 75.0000 mg | ORAL_TABLET | Freq: Every day | ORAL | Status: DC

## 2018-03-13 MED ORDER — FENTANYL CITRATE (PF) 100 MCG/2ML IJ SOLN
INTRAMUSCULAR | Status: AC
  Filled 2018-03-13: qty 2

## 2018-03-13 MED ORDER — ONDANSETRON HCL 4 MG/2ML IJ SOLN: 4.0000 mg | Freq: Four times a day (QID) | INTRAMUSCULAR | Status: DC | PRN

## 2018-03-13 MED ORDER — ASPIRIN 81 MG PO CHEW: 81.0000 mg | CHEWABLE_TABLET | ORAL | Status: DC

## 2018-03-13 MED ORDER — HYDRALAZINE HCL 20 MG/ML IJ SOLN: 5.0000 mg | INTRAMUSCULAR | Status: AC | PRN

## 2018-03-13 MED ORDER — SODIUM CHLORIDE 0.9 % IV SOLN: 250.0000 mL | INTRAVENOUS | Status: DC | PRN

## 2018-03-13 MED ORDER — BIVALIRUDIN BOLUS VIA INFUSION - CUPID
INTRAVENOUS | Status: DC | PRN
  Administered 2018-03-13: 53.775 mg via INTRAVENOUS

## 2018-03-13 MED ORDER — ATORVASTATIN CALCIUM 40 MG PO TABS
40.0000 mg | ORAL_TABLET | Freq: Every day | ORAL | Status: DC
  Administered 2018-03-13: 40 mg via ORAL
  Filled 2018-03-13: qty 1

## 2018-03-13 MED ORDER — IBUPROFEN 200 MG PO TABS: 200.0000 mg | ORAL_TABLET | Freq: Three times a day (TID) | ORAL | Status: DC | PRN

## 2018-03-13 MED ORDER — HEPARIN (PORCINE) IN NACL 2-0.9 UNIT/ML-% IJ SOLN
INTRAMUSCULAR | Status: AC | PRN
  Administered 2018-03-13 (×2): 500 mL

## 2018-03-13 MED ORDER — CLOPIDOGREL BISULFATE 300 MG PO TABS
ORAL_TABLET | ORAL | Status: AC
  Filled 2018-03-13: qty 1

## 2018-03-13 MED ORDER — ACETAMINOPHEN 325 MG PO TABS: 650.0000 mg | ORAL_TABLET | ORAL | Status: DC | PRN

## 2018-03-13 MED ORDER — AMLODIPINE BESYLATE 10 MG PO TABS
10.0000 mg | ORAL_TABLET | Freq: Every day | ORAL | Status: DC
  Administered 2018-03-14: 10:00:00 10 mg via ORAL
  Filled 2018-03-13: qty 1

## 2018-03-13 SURGICAL SUPPLY — 16 items
BALLN EUPHORA RX 2.0X12 (BALLOONS) ×2
BALLN SAPPHIRE ~~LOC~~ 2.75X12 (BALLOONS) ×1 IMPLANT
BALLOON EUPHORA RX 2.0X12 (BALLOONS) IMPLANT
CATH LAUNCHER 6FR AL1 SH (CATHETERS) IMPLANT
CATH LAUNCHER 6FR AR1 SH (CATHETERS) ×1 IMPLANT
CATHETER LAUNCHER 6FR AL1 SH (CATHETERS) ×2
CATHETER LAUNCHER 6FR JR4 SH (CATHETERS) ×1 IMPLANT
KIT ENCORE 26 ADVANTAGE (KITS) ×1 IMPLANT
KIT HEART LEFT (KITS) ×2 IMPLANT
PACK CARDIAC CATHETERIZATION (CUSTOM PROCEDURE TRAY) ×2 IMPLANT
SHEATH AVANTI 11CM 6FR (SHEATH) ×1 IMPLANT
STENT RESOLUTE ONYX 2.5X15 (Permanent Stent) ×1 IMPLANT
TRANSDUCER W/STOPCOCK (MISCELLANEOUS) ×2 IMPLANT
TUBING CIL FLEX 10 FLL-RA (TUBING) ×2 IMPLANT
WIRE ASAHI PROWATER 180CM (WIRE) ×1 IMPLANT
WIRE EMERALD 3MM-J .035X150CM (WIRE) ×1 IMPLANT

## 2018-03-13 NOTE — Interval H&P Note (Signed)
Cath Lab Visit (complete for each Cath Lab visit)  Clinical Evaluation Leading to the Procedure:   ACS: No.  Non-ACS:    Anginal Classification: CCS II  Anti-ischemic medical therapy: Maximal Therapy (2 or more classes of medications)  Non-Invasive Test Results: No non-invasive testing performed  Prior CABG: No previous CABG      History and Physical Interval Note:  03/13/2018 1:49 PM  Joshua Wyatt  has presented today for surgery, with the diagnosis of cad  The various methods of treatment have been discussed with the patient and family. After consideration of risks, benefits and other options for treatment, the patient has consented to  Procedure(s): CORONARY STENT INTERVENTION (N/A) as a surgical intervention .  The patient's history has been reviewed, patient examined, no change in status, stable for surgery.  I have reviewed the patient's chart and labs.  Questions were answered to the patient's satisfaction.     Joshua Wyatt

## 2018-03-13 NOTE — Progress Notes (Signed)
Site area: right groin  Site Prior to Removal:  Level 0  Pressure Applied For 20 MINUTES    Minutes Beginning at 1800  Manual:   Yes.    Patient Status During Pull:  AAO X 4  Post Pull Groin Site:  Level 0  Post Pull Instructions Given:  Yes.    Post Pull Pulses Present:  Yes.    Dressing Applied:  Yes.    Comments:  Tolerated procedure well

## 2018-03-14 ENCOUNTER — Encounter (HOSPITAL_COMMUNITY): Payer: Self-pay | Admitting: Cardiovascular Disease

## 2018-03-14 DIAGNOSIS — Z87891 Personal history of nicotine dependence: Secondary | ICD-10-CM | POA: Diagnosis not present

## 2018-03-14 DIAGNOSIS — Z955 Presence of coronary angioplasty implant and graft: Secondary | ICD-10-CM

## 2018-03-14 DIAGNOSIS — I10 Essential (primary) hypertension: Secondary | ICD-10-CM | POA: Diagnosis not present

## 2018-03-14 DIAGNOSIS — I200 Unstable angina: Secondary | ICD-10-CM

## 2018-03-14 DIAGNOSIS — E785 Hyperlipidemia, unspecified: Secondary | ICD-10-CM | POA: Diagnosis not present

## 2018-03-14 DIAGNOSIS — I701 Atherosclerosis of renal artery: Secondary | ICD-10-CM | POA: Diagnosis not present

## 2018-03-14 DIAGNOSIS — I25110 Atherosclerotic heart disease of native coronary artery with unstable angina pectoris: Secondary | ICD-10-CM | POA: Diagnosis not present

## 2018-03-14 DIAGNOSIS — Z7982 Long term (current) use of aspirin: Secondary | ICD-10-CM | POA: Diagnosis not present

## 2018-03-14 DIAGNOSIS — I70201 Unspecified atherosclerosis of native arteries of extremities, right leg: Secondary | ICD-10-CM | POA: Diagnosis not present

## 2018-03-14 DIAGNOSIS — Z7902 Long term (current) use of antithrombotics/antiplatelets: Secondary | ICD-10-CM | POA: Diagnosis not present

## 2018-03-14 LAB — BASIC METABOLIC PANEL
Anion gap: 8 (ref 5–15)
BUN: 11 mg/dL (ref 6–20)
CHLORIDE: 104 mmol/L (ref 101–111)
CO2: 24 mmol/L (ref 22–32)
Calcium: 9.6 mg/dL (ref 8.9–10.3)
Creatinine, Ser: 1.21 mg/dL (ref 0.61–1.24)
GFR calc non Af Amer: 58 mL/min — ABNORMAL LOW (ref >60)
Glucose, Bld: 117 mg/dL — ABNORMAL HIGH (ref 65–99)
POTASSIUM: 3.5 mmol/L (ref 3.5–5.1)
SODIUM: 136 mmol/L (ref 135–145)

## 2018-03-14 LAB — CBC
HEMATOCRIT: 31.9 % — AB (ref 39.0–52.0)
Hemoglobin: 10.1 g/dL — ABNORMAL LOW (ref 13.0–17.0)
MCH: 27.9 pg (ref 26.0–34.0)
MCHC: 31.7 g/dL (ref 30.0–36.0)
MCV: 88.1 fL (ref 78.0–100.0)
Platelets: 232 10*3/uL (ref 150–400)
RBC: 3.62 MIL/uL — AB (ref 4.22–5.81)
RDW: 12.9 % (ref 11.5–15.5)
WBC: 6.9 10*3/uL (ref 4.0–10.5)

## 2018-03-14 MED ORDER — CLOPIDOGREL BISULFATE 75 MG PO TABS: 75.0000 mg | ORAL_TABLET | Freq: Every day | ORAL | 11 refills | Status: DC

## 2018-03-14 MED FILL — Heparin Sodium (Porcine) 2 Unit/ML in Sodium Chloride 0.9%: INTRAMUSCULAR | Qty: 500 | Status: AC

## 2018-03-14 MED FILL — Nitroglycerin IV Soln 100 MCG/ML in D5W: INTRA_ARTERIAL | Qty: 10 | Status: AC

## 2018-03-14 MED FILL — Lidocaine HCl Local Inj 1%: INTRAMUSCULAR | Qty: 20 | Status: AC

## 2018-03-14 NOTE — Discharge Summary (Signed)
Discharge Summary    Patient ID: Joshua Wyatt,  MRN: [id number], DOB/AGE: [birth year] 72 y.o.  Admit date: 03/13/2018 Discharge date: 03/14/2018  Primary Care Provider: Jani Gravel Primary Cardiologist: [name], MD  Discharge Diagnoses    Active Problems:   Unstable angina [facility])   CAD (coronary artery disease)   Status post coronary artery stent placement   Allergies Allergies  Allergen Reactions  . Statins Swelling    Muscle pain    Diagnostic Studies/Procedures    Procedures   LEFT HEART CATH AND CORONARY ANGIOGRAPHY- Diagnostic 02/24/18  Conclusion     Ost RCA lesion is 90% stenosed.  Ost LM to Mid LM lesion is 40% stenosed.  Dist LM to Prox LAD lesion is 40% stenosed.  The left ventricular systolic function is normal.  LV end diastolic pressure is normal.  The left ventricular ejection fraction is 55-65% by visual estimate.   Coronary Intervention of RCA 03/13/18  Conclusion     Ost RCA lesion is 90% stenosed.  Mid RCA lesion is 40% stenosed.  A stent was successfully placed.  Post intervention, there is a 10% residual stenosis.      History of Present Illness     72 y.o. male with established h/o PVD, s/p bilateral SFA interventions, renal artery stenting as well as bilateral carotid endarterectomies, mild aortic stenosis, HLD and recently diagnosed CAD with diagnostic cath 02/24/18 showing 99% ostial RCA disease. He was readmitted for PCI of the RCA 03/13/18. He also has mild LM and prox LAD disease, not hemodynamically significant, ~40% stenosis, that is being treated medically.   Hospital Course     Pt presented to [facility] on 03/13/18 for planned intervention of the proximal RCA. Procedure was performed by Dr. Gwenlyn Found. Access obtained via the right femoral artery. He underwent successful PCI + DES to the proximal RCA. Post intervention, there was residual 10% stenosis. He tolerated the procedure well and left the cath lab in stable condition.  He was monitored overnight and had no complications. He had no chest pain or dyspnea. His right femoral cath access site remained stable w/o complications. He ambulated with cardiac rehab w/o difficulty. Vital signs remained stable. Renal function stable. He was continued on ASA, Plavix and statin therapy. He was last seen and examined by Dr. Debara Pickett who determined he was stable for d/c home. He has f/u with Dr. Gwenlyn Found, scheduled for 04/08/18.   Consultants: none     Discharge Vitals Blood pressure 138/74, pulse 72, temperature 97.9 F (36.6 C), temperature source Oral, resp. rate 17, height 5' 7.5" (1.715 m), weight 161 lb 13.1 oz (73.4 kg), SpO2 98 %.  Filed Weights   03/13/18 1019 03/14/18 0354  Weight: 158 lb (71.7 kg) 161 lb 13.1 oz (73.4 kg)    Labs & Radiologic Studies    CBC Recent Labs    03/12/18 1146 03/14/18 0408  WBC 7.6 6.9  NEUTROABS 5.1  --   HGB 12.1* 10.1*  HCT 35.9* 31.9*  MCV 87 88.1  PLT 301 665   Basic Metabolic Panel Recent Labs    03/12/18 1146 03/14/18 0408  NA 136 136  K 4.3 3.5  CL 100 104  CO2 20 24  GLUCOSE 88 117*  BUN 13 11  CREATININE 1.28* 1.21  CALCIUM 10.2 9.6   Liver Function Tests No results for input(s): AST, ALT, ALKPHOS, BILITOT, PROT, ALBUMIN in the last 72 hours. No results for input(s): LIPASE, AMYLASE in the last 72  hours. Cardiac Enzymes No results for input(s): CKTOTAL, CKMB, CKMBINDEX, TROPONINI in the last 72 hours. BNP Invalid input(s): POCBNP D-Dimer No results for input(s): DDIMER in the last 72 hours. Hemoglobin A1C No results for input(s): HGBA1C in the last 72 hours. Fasting Lipid Panel No results for input(s): CHOL, HDL, LDLCALC, TRIG, CHOLHDL, LDLDIRECT in the last 72 hours. Thyroid Function Tests No results for input(s): TSH, T4TOTAL, T3FREE, THYROIDAB in the last 72 hours.  Invalid input(s): FREET3 _____________  No results found. Disposition   Pt is being discharged home today in good  condition.  Follow-up Plans & Appointments    Follow-up Information    [facility] [address] Follow up on 04/01/2018.   Specialty:  Cardiology Why:  10:00 am for ultrasound of legs and neck Contact information: [address] [place]       Lorretta Harp, MD Follow up on 04/08/2018.   Specialties:  Cardiology, Radiology Why:  11:00 AM.  Contact information: [address] [place] [address] [phone]          Discharge Instructions    AMB Referral to Cardiac Rehabilitation - Phase II   Complete by:  As directed    Diagnosis:  Coronary Stents   Amb Referral to Cardiac Rehabilitation   Complete by:  As directed    Diagnosis:  Coronary Stents      Discharge Medications   Allergies as of 03/14/2018      Reactions   Statins Swelling   Muscle pain      Medication List    STOP taking these medications   ibuprofen 200 MG tablet Commonly known as:  ADVIL,MOTRIN     TAKE these medications   amLODipine 10 MG tablet Commonly known as:  NORVASC Take 1 tablet (10 mg total) by mouth daily.   aspirin 81 MG tablet Take 81 mg by mouth daily.   atorvastatin 40 MG tablet Commonly known as:  LIPITOR Take 40 mg by mouth at bedtime.   clopidogrel 75 MG tablet Commonly known as:  PLAVIX TAKE 1 TABLET BY MOUTH ONCE DAILY What changed:  Another medication with the same name was added. Make sure you understand how and when to take each.   clopidogrel 75 MG tablet Commonly known as:  PLAVIX Take 1 tablet (75 mg total) by mouth daily with breakfast. Start taking on:  03/15/2018 What changed:  You were already taking a medication with the same name, and this prescription was added. Make sure you understand how and when to take each.   ezetimibe 10 MG tablet Commonly known as:  ZETIA Take 1 tablet (10 mg total) by mouth daily.   irbesartan 300 MG  tablet Commonly known as:  AVAPRO Take 1 tablet (300 mg total) by mouth daily. What changed:  when to take this   nitroGLYCERIN 0.4 MG SL tablet Commonly known as:  NITROSTAT Place 1 tablet (0.4 mg total) under the tongue every 5 (five) minutes as needed.   pantoprazole 40 MG tablet Commonly known as:  PROTONIX Take 1 tablet (40 mg total) by mouth daily.   tamsulosin 0.4 MG Caps capsule Commonly known as:  FLOMAX Take 0.4 mg by mouth at bedtime.        Duration of Discharge Encounter   Greater than 30 minutes including physician time.  Signed, Lyda Jester PA-C 03/14/2018, 10:52 AM

## 2018-03-14 NOTE — Progress Notes (Signed)
Progress Note  Patient Name: Joshua Wyatt Date of Encounter: 03/14/2018  Primary Cardiologist: [name], MD   Subjective   Doing well this morning. No angina. No dyspnea. Right femoral access site is stable. No LBP or flank pain.   Inpatient Medications    Scheduled Meds: . amLODipine  10 mg Oral Daily  . angioplasty book   Does not apply Once  . aspirin EC  81 mg Oral Daily  . atorvastatin  40 mg Oral QHS  . clopidogrel  75 mg Oral Q breakfast  . ezetimibe  10 mg Oral Daily  . irbesartan  300 mg Oral Daily  . pantoprazole  40 mg Oral Daily  . sodium chloride flush  3 mL Intravenous Q12H  . tamsulosin  0.4 mg Oral QHS   Continuous Infusions: . sodium chloride     PRN Meds: sodium chloride, acetaminophen, ibuprofen, morphine injection, nitroGLYCERIN, ondansetron (ZOFRAN) IV, sodium chloride flush   Vital Signs    Vitals:   03/13/18 2100 03/13/18 2114 03/14/18 0354 03/14/18 0722  BP: 139/64  129/74 138/74  Pulse: 65 68 74 72  Resp: $Remo'13 11 10 17  'lRxiH$ Temp: 97.7 F (36.5 C)  98.2 F (36.8 C) 97.9 F (36.6 C)  TempSrc: Oral  Oral Oral  SpO2: 94% 97% 96% 98%  Weight:   161 lb 13.1 oz (73.4 kg)   Height:        Intake/Output Summary (Last 24 hours) at 03/14/2018 0746 Last data filed at 03/14/2018 0000 Gross per 24 hour  Intake 875 ml  Output 1100 ml  Net -225 ml   Filed Weights   03/13/18 1019 03/14/18 0354  Weight: 158 lb (71.7 kg) 161 lb 13.1 oz (73.4 kg)    Telemetry    NSR - Personally Reviewed  ECG    NSR w/ PACs - Personally Reviewed  Physical Exam   GEN: No acute distress.   Neck: No JVD Cardiac: RRR, 2/6 murmur at RUSB Respiratory: Clear to auscultation bilaterally. GI: Soft, nontender, non-distended  MS: No edema; No deformity. Neuro:  Nonfocal  Psych: Normal affect   Labs    Chemistry Recent Labs  Lab 03/12/18 1146 03/14/18 0408  NA 136 136  K 4.3 3.5  CL 100 104  CO2 20 24  GLUCOSE 88 117*  BUN 13 11  CREATININE 1.28*  1.21  CALCIUM 10.2 9.6  GFRNONAA 55* 58*  GFRAA 64 >60  ANIONGAP  --  8     Hematology Recent Labs  Lab 03/12/18 1146 03/14/18 0408  WBC 7.6 6.9  RBC 4.12* 3.62*  HGB 12.1* 10.1*  HCT 35.9* 31.9*  MCV 87 88.1  MCH 29.4 27.9  MCHC 33.7 31.7  RDW 13.6 12.9  PLT 301 232    Cardiac EnzymesNo results for input(s): TROPONINI in the last 168 hours. No results for input(s): TROPIPOC in the last 168 hours.   BNPNo results for input(s): BNP, PROBNP in the last 168 hours.   DDimer No results for input(s): DDIMER in the last 168 hours.   Radiology    No results found.  Cardiac Studies   Procedures   LEFT HEART CATH AND CORONARY ANGIOGRAPHY 03/13/18  Conclusion     Ost RCA lesion is 90% stenosed.  Ost LM to Mid LM lesion is 40% stenosed.  Dist LM to Prox LAD lesion is 40% stenosed.  The left ventricular systolic function is normal.  LV end diastolic pressure is normal.  The left ventricular ejection fraction  is 55-65% by visual estimate.       Patient Profile     72 y.o. male with established h/o PVD, s/p bilateral SFA interventions, renal artery stenting as well as bilateral carotid endarterectomies, mild aortic stenosis and recently diagnosed CAD with diagnostic cath 02/24/18 showing 99% ostial RCA disease. He was readmitted for PCI of the RCA 03/13/18. He also has mild LM and prox LAD disease, not hemodynamically significant, ~40% stenosis, that is being treated medically.   Assessment & Plan    1. CAD: s/p successful PCI of the proximal RCA, utilizing a DES. He also has known mild left main and proximal segmental LAD disease, that did not appear to be hemodynamically significant, ~40% stenosis, at time of diagnostic cath 02/24/18. This will be treated medically.  He denies anginal symptoms. No dyspnea. He had a drop in Hgb from 12.1>>10.1, however right femoral cath site is stable w/o ecchymosis or hematoma. No low back or flank pain. Can repeat CBC at outpatient f/u.  Renal function is stable. Continue ASA and Plavix for DES, for a minimum of 1 year, along with statin. Currently not on a BB, average HR in the 70s. May consider addition of low dose metoprolol. Ambulate with cardiac rehab this morning and home later today if he dose well with rehab.   2. PVD: per above. Stable and followed by Dr. Gwenlyn Found.   3. HTN: controlled this morning at 138/74. Continue current regimen.   4. Aortic Stenosis: 2/2 murmur heard on exam at RUSB. Echo in 2017 showed mild AS. Continue to follow as outpatient.   Dispo: home later today after MD sees. F/u in clinic in 1-2 weeks.   For questions or updates, please contact [place] Please consult [email or web address] for contact info under Cardiology/STEMI.      Signed, Lyda Jester, PA-C  03/14/2018, 7:46 AM

## 2018-03-14 NOTE — Progress Notes (Signed)
CARDIAC REHAB PHASE I   PRE:  Rate/Rhythm: 80 SR  BP:  Supine: 140/74  Sitting:   Standing:    SaO2:   MODE:  Ambulation: 500 ft   POST:  Rate/Rhythm: 100 ST  BP:  Supine:   Sitting: 149/64  Standing:    SaO2:  0815-0907 Pt walked 500 ft with steady slow gait and tolerated well. No CP. Education completed with pt who voiced understanding. Stressed importance of plavix with stent. Reviewed NTG use, heart healthy food choices, ex ed and CRP 2. Pt eats a lot of cookies for his meal as his wife is ill and he has been doing the cooking. Made a few suggestions of healthier options. Encouraged him to read heart healthy diet. Pt receptive. Will refer to [place] CRP 2.   Graylon Good, RN BSN  03/14/2018 9:03 AM

## 2018-03-17 ENCOUNTER — Telehealth (HOSPITAL_COMMUNITY): Payer: Self-pay

## 2018-03-17 NOTE — Telephone Encounter (Signed)
Patients insurance is active and benefits verified through [facility] - $20.00 co-pay, no deductible, out of pocket amount of $4,400/$355.67 has been met, no co-insurance, and no pre-authorization is required. Passport/reference [phone]  Patient will be contacted for scheduling upon review by the RN Navigator.

## 2018-03-21 ENCOUNTER — Telehealth (HOSPITAL_COMMUNITY): Payer: Self-pay

## 2018-03-21 NOTE — Telephone Encounter (Signed)
Called patient to see if he is interested in the Cardiac Rehab Program. Patient stated he is interested in the program although he would like to talk it over with his wife. Explained the program to the patient. What to expect during orientation, and also went over insurance. Patient will call once spoken to wife. If no call, will follow up.

## 2018-03-28 ENCOUNTER — Telehealth (HOSPITAL_COMMUNITY): Payer: Self-pay

## 2018-03-28 NOTE — Telephone Encounter (Signed)
Called patient to follow up in regards to Cardiac Rehab - Patient stated he cannot afford the program. Closed referral.

## 2018-04-01 ENCOUNTER — Ambulatory Visit (HOSPITAL_COMMUNITY)
Admission: RE | Admit: 2018-04-01 | Discharge: 2018-04-01 | Disposition: A | Discharge status: Home / Self Care | Payer: Medicare Other | Source: Ambulatory Visit | Attending: Cardiology | Admitting: Cardiology

## 2018-04-01 DIAGNOSIS — I739 Peripheral vascular disease, unspecified: Secondary | ICD-10-CM | POA: Insufficient documentation

## 2018-04-01 DIAGNOSIS — I2510 Atherosclerotic heart disease of native coronary artery without angina pectoris: Secondary | ICD-10-CM | POA: Insufficient documentation

## 2018-04-01 DIAGNOSIS — I10 Essential (primary) hypertension: Secondary | ICD-10-CM | POA: Insufficient documentation

## 2018-04-01 DIAGNOSIS — Z87891 Personal history of nicotine dependence: Secondary | ICD-10-CM | POA: Insufficient documentation

## 2018-04-01 DIAGNOSIS — I70202 Unspecified atherosclerosis of native arteries of extremities, left leg: Secondary | ICD-10-CM | POA: Insufficient documentation

## 2018-04-01 DIAGNOSIS — E785 Hyperlipidemia, unspecified: Secondary | ICD-10-CM | POA: Diagnosis not present

## 2018-04-01 DIAGNOSIS — F17200 Nicotine dependence, unspecified, uncomplicated: Secondary | ICD-10-CM | POA: Insufficient documentation

## 2018-04-01 DIAGNOSIS — I6523 Occlusion and stenosis of bilateral carotid arteries: Secondary | ICD-10-CM | POA: Diagnosis not present

## 2018-04-08 ENCOUNTER — Ambulatory Visit: Payer: Medicare Other | Admitting: Cardiovascular Disease

## 2018-04-08 ENCOUNTER — Encounter: Payer: Self-pay | Admitting: Cardiovascular Disease

## 2018-04-08 VITALS — BP 132/60 | HR 64 | Ht 67.5 in | Wt 156.0 lb

## 2018-04-08 DIAGNOSIS — I739 Peripheral vascular disease, unspecified: Secondary | ICD-10-CM

## 2018-04-08 DIAGNOSIS — Z955 Presence of coronary angioplasty implant and graft: Secondary | ICD-10-CM | POA: Diagnosis not present

## 2018-04-08 DIAGNOSIS — I10 Essential (primary) hypertension: Secondary | ICD-10-CM | POA: Diagnosis not present

## 2018-04-08 NOTE — Patient Instructions (Signed)
Medication Instructions: Your physician recommends that you continue on your current medications as directed. Please refer to the Current Medication list given to you today.   Follow-Up: Your physician recommends that you schedule a follow-up appointment in: 3 months with Dr. Gwenlyn Found.  If you need a refill on your cardiac medications before your next appointment, please call your pharmacy.

## 2018-04-08 NOTE — Progress Notes (Signed)
04/08/2018 Joshua Wyatt   December 05, 1946  [id number]  Primary Physician Jani Gravel, MD Primary Cardiologist: Lorretta Harp MD FACP, [place], [place], [place]  HPI:  Joshua Wyatt is a 72 y.o.  thin appearing married Caucasian male with no children who I last saw in the office4/02/2018.[name] He has a history of PVOD status post bilateral carotid endarterectomies performed by Dr. Drucie Opitz in 2007. We have been following him by duplex ultrasound which performed most recently several days ago revealed this to be widely patent. He has also had bilateral SFA intervention as well as right renal artery stenting. His other problems include hypertension and hyperlipidemia. He did have an episode of chest heaviness recently, but more importantly he has complained of progressive lifestyle limiting claudication since I last saw him. Most recent lower extremity Dopplers reveal a right ABI of 0.93 and a left of 0.82. He does appear to have moderate iliac disease as well as high grade right SFA and popliteal stenosis. He underwent abdominal aortography with bifemoral runoff on 03/30/13 revealed a widely patent right renal artery stent, 50-60% proximal left renal artery stenosis. The left SFA was while he stayed patent and he had a 95% focal above-the-knee popliteal artery stenosis with three-vessel runoff. There was a 60% focal lesion in the right common iliac artery and a 50% segmental proximal to mid right SFA stenosis with a patent stent in the mid to distal right SFA. He also had a 50% right popliteal stenosis with three-vessel runoff. He underwent cutting balloon angioplasty of the left above-the-knee popliteal artery and Angiosculpt balloon with an excellent angiographic result and was discharged the following day. I performed angiography on him 10/11/14 and recanalized a subtotally occluded right SFA with overlapping via Bahn covered stent performed intervention on his above-the-knee popliteal artery on that side. He had  a patent mid to distal left SFA stent with high grade segmental and 6 sequential disease in the proximal SFA and distal SFA on the left. Since I saw him a year and a half ago he's had progressive lifestyle limiting claudication in his left calf. Addition, he's had severe progression of disease in his right internal carotid artery. I performed carotid stenting on him 01/03/17 with an excellent result. He underwent left lower extremity angiography by myself 02/03/17 revealing an occluded distal left SFA stent with otherwise high-grade segmental disease proximal to this and occluded anterior tibial. I performed cutting balloon atherectomy of his in-stent restenosis and the surrounding disease excellent angiographic result. His claudication has completely resolved. His left ABI has improved from 0.59 up to 0.  He was admitted to the hospital for accelerated unstable angina on 02/24/18 performed left heart cath via the right radial approach revealing a high-grade ostial dominant RCA stenosis anterior takeoff only visualized using an AR-1 catheter.   I performed RCA ostial CI and drug-eluting stenting 03/13/2018 which resulted in marked improvement in his angina and shortness of breath.  He also complains of left calf lifestyle limiting claudication and had SFA intervention by myself February 2018.  Recent Dopplers performed 04/01/2018 revealed decreased left ABI with a high-frequency signal in his mid left SFA.  Current Meds  Medication Sig  . amLODipine (NORVASC) 10 MG tablet Take 1 tablet (10 mg total) by mouth daily.  [name] aspirin 81 MG tablet Take 81 mg by mouth daily.  [name] atorvastatin (LIPITOR) 40 MG tablet Take 40 mg by mouth at bedtime.   . clopidogrel (PLAVIX) 75 MG tablet TAKE  1 TABLET BY MOUTH ONCE DAILY  . clopidogrel (PLAVIX) 75 MG tablet Take 1 tablet (75 mg total) by mouth daily with breakfast.  . ezetimibe (ZETIA) 10 MG tablet Take 1 tablet (10 mg total) by mouth daily.  . irbesartan (AVAPRO) 300 MG  tablet Take 1 tablet (300 mg total) by mouth daily. (Patient taking differently: Take 300 mg by mouth at bedtime. )  . nitroGLYCERIN (NITROSTAT) 0.4 MG SL tablet Place 1 tablet (0.4 mg total) under the tongue every 5 (five) minutes as needed.  . pantoprazole (PROTONIX) 40 MG tablet Take 1 tablet (40 mg total) by mouth daily.  . tamsulosin (FLOMAX) 0.4 MG CAPS capsule Take 0.4 mg by mouth at bedtime.      Allergies  Allergen Reactions  . Statins Swelling    Muscle pain    Social History   Socioeconomic History  . Marital status: Married    Spouse name: Not on file  . Number of children: Not on file  . Years of education: Not on file  . Highest education level: Not on file  Occupational History  . Not on file  Social Needs  . Financial resource strain: Not on file  . Food insecurity:    Worry: Not on file    Inability: Not on file  . Transportation needs:    Medical: Not on file    Non-medical: Not on file  Tobacco Use  . Smoking status: Former Smoker    Packs/day: 1.00    Years: 58.00    Pack years: 58.00    Types: Cigars, Cigarettes    Last attempt to quit: 10/18/2013    Years since quitting: 4.4  . Smokeless tobacco: Never Used  Substance and Sexual Activity  . Alcohol use: No  . Drug use: No  . Sexual activity: Yes  Lifestyle  . Physical activity:    Days per week: Not on file    Minutes per session: Not on file  . Stress: Not on file  Relationships  . Social connections:    Talks on phone: Not on file    Gets together: Not on file    Attends religious service: Not on file    Active member of club or organization: Not on file    Attends meetings of clubs or organizations: Not on file    Relationship status: Not on file  . Intimate partner violence:    Fear of current or ex partner: Not on file    Emotionally abused: Not on file    Physically abused: Not on file    Forced sexual activity: Not on file  Other Topics Concern  . Not on file  Social History  Narrative  . Not on file     Review of Systems: General: negative for chills, fever, night sweats or weight changes.  Cardiovascular: negative for chest pain, dyspnea on exertion, edema, orthopnea, palpitations, paroxysmal nocturnal dyspnea or shortness of breath Dermatological: negative for rash Respiratory: negative for cough or wheezing Urologic: negative for hematuria Abdominal: negative for nausea, vomiting, diarrhea, bright red blood per rectum, melena, or hematemesis Neurologic: negative for visual changes, syncope, or dizziness All other systems reviewed and are otherwise negative except as noted above.    Blood pressure 132/60, pulse 64, height 5' 7.5" (1.715 m), weight 156 lb (70.8 kg).  General appearance: alert and no distress Neck: no adenopathy, no JVD, supple, symmetrical, trachea midline, thyroid not enlarged, symmetric, no tenderness/mass/nodules and Soft bilateral carotid bruits Lungs: clear to  auscultation bilaterally Heart: regular rate and rhythm, S1, S2 normal, no murmur, click, rub or gallop Extremities: extremities normal, atraumatic, no cyanosis or edema Pulses: Diminished pedal pulses Skin: Skin color, texture, turgor normal. No rashes or lesions Neurologic: Alert and oriented X 3, normal strength and tone. Normal symmetric reflexes. Normal coordination and gait  EKG Not performed today  ASSESSMENT AND PLAN:   PVD (peripheral vascular disease) ([place]) History of peripheral arterial disease status post multiple interventions to his left leg most recently 02/03/2017.been occluded distal left SFA.  He has two-vessel runoff.  His claudication improved for a significant amount of time after intervention although he has had recurrent lifestyle limiting left calf claudication.  His recent Dopplers performed 04/01/18 did reveal with a high-frequency signal in his mid left SFA. He wishes  to wait to have this intervened on for several months.  Uncontrolled  hypertension Blood pressure measured today at 132/60.  He is on amlodipine and Avapro.  Current meds at current dosing.  Status post coronary artery stent placement History of CAD status post RCA ostial drug-eluting stenting 03/13/2018 via the femoral approach.  The symptoms of chest pain have markedly improved.      Lorretta Harp MD FACP,FACC,FAHA, FSCAI 04/08/2018 12:00 PM

## 2018-04-08 NOTE — Assessment & Plan Note (Signed)
History of peripheral arterial disease status post multiple interventions to his left leg most recently 02/03/2017.been occluded distal left SFA.  He has two-vessel runoff.  His claudication improved for a significant amount of time after intervention although he has had recurrent lifestyle limiting left calf claudication.  His recent Dopplers performed 04/01/18 did reveal with a high-frequency signal in his mid left SFA. He wishes  to wait to have this intervened on for several months.

## 2018-04-08 NOTE — Assessment & Plan Note (Signed)
Blood pressure measured today at 132/60.  He is on amlodipine and Avapro.  Current meds at current dosing.

## 2018-04-08 NOTE — Assessment & Plan Note (Signed)
History of CAD status post RCA ostial drug-eluting stenting 03/13/2018 via the femoral approach.  The symptoms of chest pain have markedly improved.

## 2018-04-09 DIAGNOSIS — N401 Enlarged prostate with lower urinary tract symptoms: Secondary | ICD-10-CM | POA: Diagnosis not present

## 2018-04-10 ENCOUNTER — Other Ambulatory Visit: Payer: Self-pay | Admitting: Cardiovascular Disease

## 2018-04-10 DIAGNOSIS — I739 Peripheral vascular disease, unspecified: Secondary | ICD-10-CM

## 2018-04-11 ENCOUNTER — Telehealth: Payer: Self-pay | Admitting: Cardiovascular Disease

## 2018-04-11 NOTE — Telephone Encounter (Signed)
New message   Patient calling to discuss scheduling surgical procedure. Please call

## 2018-04-11 NOTE — Telephone Encounter (Signed)
Returned call to patient who wishes to proceed with PV intervention as discussed at last [place] on 4/30  Patient is available to do this on 5/16 - MD is scheduled to be in [facility] lab this day.   Routed to T. Stover CMA to call patient to schedule/provide instructions

## 2018-04-14 ENCOUNTER — Telehealth: Payer: Self-pay | Admitting: *Deleted

## 2018-04-14 NOTE — Telephone Encounter (Signed)
Patient came in as a walk in. He stated that he is ready to proceed with his angiogram. He is available 5/13 and 5/16 if this works for Dr. Kennon Holter schedule. Message has been routed to the provider and his assist.

## 2018-04-16 DIAGNOSIS — R3915 Urgency of urination: Secondary | ICD-10-CM | POA: Diagnosis not present

## 2018-04-16 DIAGNOSIS — N401 Enlarged prostate with lower urinary tract symptoms: Secondary | ICD-10-CM | POA: Diagnosis not present

## 2018-04-17 NOTE — Telephone Encounter (Signed)
[name], can you arrange the procedure for Joshua Wyatt.  I believe it is right groin puncture and Scott

## 2018-04-25 NOTE — Telephone Encounter (Signed)
lmtcb to schedule procedure.

## 2018-04-25 NOTE — Telephone Encounter (Signed)
Pt will be schedule for June 3 at 7:30 AM; mailing letter with instructions and blood work.   Dr. Gwenlyn Found, do you need a rep for this procedure? And which groin access?

## 2018-04-27 NOTE — Telephone Encounter (Signed)
Yes, Scott. Thx  JJB

## 2018-04-30 ENCOUNTER — Other Ambulatory Visit: Payer: Self-pay | Admitting: Cardiovascular Disease

## 2018-04-30 ENCOUNTER — Encounter: Payer: Self-pay | Admitting: Cardiovascular Disease

## 2018-04-30 DIAGNOSIS — I739 Peripheral vascular disease, unspecified: Secondary | ICD-10-CM

## 2018-04-30 NOTE — Telephone Encounter (Signed)
Procedure scheduled. Instructions and labs mailed to pt.

## 2018-05-08 DIAGNOSIS — I739 Peripheral vascular disease, unspecified: Secondary | ICD-10-CM | POA: Diagnosis not present

## 2018-05-09 ENCOUNTER — Other Ambulatory Visit: Payer: Self-pay | Admitting: Cardiovascular Disease

## 2018-05-09 DIAGNOSIS — I739 Peripheral vascular disease, unspecified: Secondary | ICD-10-CM

## 2018-05-09 LAB — CBC WITH DIFFERENTIAL/PLATELET
BASOS ABS: 0 10*3/uL (ref 0.0–0.2)
Basos: 0 %
EOS (ABSOLUTE): 0.2 10*3/uL (ref 0.0–0.4)
EOS: 3 %
Hematocrit: 35.5 % — ABNORMAL LOW (ref 37.5–51.0)
Hemoglobin: 11.4 g/dL — ABNORMAL LOW (ref 13.0–17.7)
Immature Grans (Abs): 0 10*3/uL (ref 0.0–0.1)
Immature Granulocytes: 0 %
Lymphocytes Absolute: 1.3 10*3/uL (ref 0.7–3.1)
Lymphs: 25 %
MCH: 28.1 pg (ref 26.6–33.0)
MCHC: 32.1 g/dL (ref 31.5–35.7)
MCV: 88 fL (ref 79–97)
MONOS ABS: 0.5 10*3/uL (ref 0.1–0.9)
Monocytes: 10 %
NEUTROS PCT: 62 %
Neutrophils Absolute: 3.3 10*3/uL (ref 1.4–7.0)
PLATELETS: 210 10*3/uL (ref 150–450)
RBC: 4.05 x10E6/uL — AB (ref 4.14–5.80)
RDW: 13.9 % (ref 12.3–15.4)
WBC: 5.3 10*3/uL (ref 3.4–10.8)

## 2018-05-09 LAB — BASIC METABOLIC PANEL
BUN/Creatinine Ratio: 12 (ref 10–24)
BUN: 18 mg/dL (ref 8–27)
CHLORIDE: 104 mmol/L (ref 96–106)
CO2: 18 mmol/L — AB (ref 20–29)
Calcium: 9.7 mg/dL (ref 8.6–10.2)
Creatinine, Ser: 1.54 mg/dL — ABNORMAL HIGH (ref 0.76–1.27)
GFR calc Af Amer: 51 mL/min/{1.73_m2} — ABNORMAL LOW (ref >59)
GFR calc non Af Amer: 44 mL/min/{1.73_m2} — ABNORMAL LOW (ref >59)
GLUCOSE: 103 mg/dL — AB (ref 65–99)
POTASSIUM: 4.6 mmol/L (ref 3.5–5.2)
SODIUM: 140 mmol/L (ref 134–144)

## 2018-05-09 LAB — PROTIME-INR
INR: 1 (ref 0.8–1.2)
Prothrombin Time: 10.5 s (ref 9.1–12.0)

## 2018-05-09 LAB — APTT: aPTT: 27 s (ref 24–33)

## 2018-05-11 ENCOUNTER — Encounter (HOSPITAL_COMMUNITY): Payer: Self-pay | Admitting: *Deleted

## 2018-05-11 ENCOUNTER — Other Ambulatory Visit: Payer: Self-pay

## 2018-05-11 ENCOUNTER — Observation Stay (HOSPITAL_COMMUNITY)
Admission: RE | Admit: 2018-05-11 | Discharge: 2018-05-13 | Disposition: A | Discharge status: Home / Self Care | Payer: Medicare Other | Source: Ambulatory Visit | Attending: Cardiovascular Disease | Admitting: Cardiovascular Disease

## 2018-05-11 DIAGNOSIS — N1832 Chronic kidney disease, stage 3b: Secondary | ICD-10-CM | POA: Diagnosis present

## 2018-05-11 DIAGNOSIS — Z79899 Other long term (current) drug therapy: Secondary | ICD-10-CM | POA: Insufficient documentation

## 2018-05-11 DIAGNOSIS — N183 Chronic kidney disease, stage 3 unspecified: Secondary | ICD-10-CM | POA: Diagnosis present

## 2018-05-11 DIAGNOSIS — Z7982 Long term (current) use of aspirin: Secondary | ICD-10-CM | POA: Insufficient documentation

## 2018-05-11 DIAGNOSIS — I70212 Atherosclerosis of native arteries of extremities with intermittent claudication, left leg: Secondary | ICD-10-CM | POA: Diagnosis not present

## 2018-05-11 DIAGNOSIS — I739 Peripheral vascular disease, unspecified: Secondary | ICD-10-CM | POA: Diagnosis not present

## 2018-05-11 DIAGNOSIS — K219 Gastro-esophageal reflux disease without esophagitis: Secondary | ICD-10-CM | POA: Diagnosis not present

## 2018-05-11 DIAGNOSIS — Z9889 Other specified postprocedural states: Secondary | ICD-10-CM | POA: Insufficient documentation

## 2018-05-11 DIAGNOSIS — Z7902 Long term (current) use of antithrombotics/antiplatelets: Secondary | ICD-10-CM | POA: Insufficient documentation

## 2018-05-11 DIAGNOSIS — Z87442 Personal history of urinary calculi: Secondary | ICD-10-CM | POA: Diagnosis not present

## 2018-05-11 DIAGNOSIS — I10 Essential (primary) hypertension: Secondary | ICD-10-CM | POA: Diagnosis not present

## 2018-05-11 DIAGNOSIS — E785 Hyperlipidemia, unspecified: Secondary | ICD-10-CM

## 2018-05-11 DIAGNOSIS — Z87891 Personal history of nicotine dependence: Secondary | ICD-10-CM | POA: Insufficient documentation

## 2018-05-11 DIAGNOSIS — Z888 Allergy status to other drugs, medicaments and biological substances status: Secondary | ICD-10-CM | POA: Insufficient documentation

## 2018-05-11 DIAGNOSIS — E782 Mixed hyperlipidemia: Secondary | ICD-10-CM | POA: Diagnosis present

## 2018-05-11 DIAGNOSIS — I779 Disorder of arteries and arterioles, unspecified: Secondary | ICD-10-CM | POA: Diagnosis present

## 2018-05-11 DIAGNOSIS — Z8249 Family history of ischemic heart disease and other diseases of the circulatory system: Secondary | ICD-10-CM | POA: Insufficient documentation

## 2018-05-11 DIAGNOSIS — I25118 Atherosclerotic heart disease of native coronary artery with other forms of angina pectoris: Secondary | ICD-10-CM | POA: Diagnosis not present

## 2018-05-11 DIAGNOSIS — Z955 Presence of coronary angioplasty implant and graft: Secondary | ICD-10-CM | POA: Insufficient documentation

## 2018-05-11 DIAGNOSIS — I2510 Atherosclerotic heart disease of native coronary artery without angina pectoris: Secondary | ICD-10-CM | POA: Diagnosis present

## 2018-05-11 HISTORY — DX: Unspecified osteoarthritis, unspecified site: M19.90

## 2018-05-11 LAB — CBC
HCT: 33.2 % — ABNORMAL LOW (ref 39.0–52.0)
HEMOGLOBIN: 10.8 g/dL — AB (ref 13.0–17.0)
MCH: 28.2 pg (ref 26.0–34.0)
MCHC: 32.5 g/dL (ref 30.0–36.0)
MCV: 86.7 fL (ref 78.0–100.0)
PLATELETS: 203 10*3/uL (ref 150–400)
RBC: 3.83 MIL/uL — AB (ref 4.22–5.81)
RDW: 12.2 % (ref 11.5–15.5)
WBC: 6.2 10*3/uL (ref 4.0–10.5)

## 2018-05-11 LAB — BASIC METABOLIC PANEL
ANION GAP: 8 (ref 5–15)
BUN: 20 mg/dL (ref 6–20)
CO2: 22 mmol/L (ref 22–32)
CREATININE: 1.44 mg/dL — AB (ref 0.61–1.24)
Calcium: 9.1 mg/dL (ref 8.9–10.3)
Chloride: 105 mmol/L (ref 101–111)
GFR calc non Af Amer: 47 mL/min — ABNORMAL LOW (ref >60)
GFR, EST AFRICAN AMERICAN: 54 mL/min — AB (ref >60)
Glucose, Bld: 109 mg/dL — ABNORMAL HIGH (ref 65–99)
Potassium: 3.4 mmol/L — ABNORMAL LOW (ref 3.5–5.1)
SODIUM: 135 mmol/L (ref 135–145)

## 2018-05-11 LAB — GLUCOSE, CAPILLARY: GLUCOSE-CAPILLARY: 143 mg/dL — AB (ref 65–99)

## 2018-05-11 MED ORDER — POTASSIUM CHLORIDE CRYS ER 20 MEQ PO TBCR
40.0000 meq | EXTENDED_RELEASE_TABLET | Freq: Once | ORAL | Status: AC
  Administered 2018-05-11: 40 meq via ORAL
  Filled 2018-05-11: qty 2

## 2018-05-11 MED ORDER — ATORVASTATIN CALCIUM 40 MG PO TABS
40.0000 mg | ORAL_TABLET | Freq: Every day | ORAL | Status: DC
  Administered 2018-05-11 – 2018-05-12 (×2): 40 mg via ORAL
  Filled 2018-05-11 (×2): qty 1

## 2018-05-11 MED ORDER — AMLODIPINE BESYLATE 10 MG PO TABS
10.0000 mg | ORAL_TABLET | Freq: Every day | ORAL | Status: DC
  Administered 2018-05-12 – 2018-05-13 (×2): 10 mg via ORAL
  Filled 2018-05-11 (×2): qty 1

## 2018-05-11 MED ORDER — ASPIRIN 81 MG PO CHEW
81.0000 mg | CHEWABLE_TABLET | ORAL | Status: AC
  Administered 2018-05-12: 81 mg via ORAL
  Filled 2018-05-11: qty 1

## 2018-05-11 MED ORDER — SODIUM CHLORIDE 0.9% FLUSH
3.0000 mL | Freq: Two times a day (BID) | INTRAVENOUS | Status: DC
  Administered 2018-05-11: 3 mL via INTRAVENOUS

## 2018-05-11 MED ORDER — SODIUM CHLORIDE 0.9 % IV SOLN: 250.0000 mL | INTRAVENOUS | Status: DC | PRN

## 2018-05-11 MED ORDER — SODIUM CHLORIDE 0.9 % WEIGHT BASED INFUSION
3.0000 mL/kg/h | INTRAVENOUS | Status: AC
  Administered 2018-05-11: 3 mL/kg/h via INTRAVENOUS

## 2018-05-11 MED ORDER — ZOLPIDEM TARTRATE 5 MG PO TABS: 5.0000 mg | ORAL_TABLET | Freq: Every evening | ORAL | Status: DC | PRN

## 2018-05-11 MED ORDER — SODIUM CHLORIDE 0.9 % WEIGHT BASED INFUSION
1.0000 mL/kg/h | INTRAVENOUS | Status: DC
  Administered 2018-05-11: 1 mL/kg/h via INTRAVENOUS

## 2018-05-11 MED ORDER — CLOPIDOGREL BISULFATE 75 MG PO TABS
75.0000 mg | ORAL_TABLET | Freq: Every day | ORAL | Status: DC
  Administered 2018-05-12 – 2018-05-13 (×2): 75 mg via ORAL
  Filled 2018-05-11 (×2): qty 1

## 2018-05-11 MED ORDER — ACETAMINOPHEN 325 MG PO TABS: 650.0000 mg | ORAL_TABLET | ORAL | Status: DC | PRN

## 2018-05-11 MED ORDER — ONDANSETRON HCL 4 MG/2ML IJ SOLN
4.0000 mg | Freq: Four times a day (QID) | INTRAMUSCULAR | Status: DC | PRN
  Filled 2018-05-11: qty 2

## 2018-05-11 MED ORDER — ALPRAZOLAM 0.25 MG PO TABS: 0.2500 mg | ORAL_TABLET | Freq: Two times a day (BID) | ORAL | Status: DC | PRN

## 2018-05-11 MED ORDER — NITROGLYCERIN 0.4 MG SL SUBL: 0.4000 mg | SUBLINGUAL_TABLET | SUBLINGUAL | Status: DC | PRN

## 2018-05-11 MED ORDER — TAMSULOSIN HCL 0.4 MG PO CAPS
0.4000 mg | ORAL_CAPSULE | Freq: Every day | ORAL | Status: DC
  Administered 2018-05-11 – 2018-05-12 (×2): 0.4 mg via ORAL
  Filled 2018-05-11 (×2): qty 1

## 2018-05-11 MED ORDER — EZETIMIBE 10 MG PO TABS
10.0000 mg | ORAL_TABLET | Freq: Every day | ORAL | Status: DC
  Administered 2018-05-12 – 2018-05-13 (×2): 10 mg via ORAL
  Filled 2018-05-11 (×2): qty 1

## 2018-05-11 MED ORDER — SODIUM CHLORIDE 0.9% FLUSH: 3.0000 mL | INTRAVENOUS | Status: DC | PRN

## 2018-05-11 MED ORDER — ASPIRIN 81 MG PO CHEW
81.0000 mg | CHEWABLE_TABLET | Freq: Every day | ORAL | Status: DC
  Filled 2018-05-11: qty 1

## 2018-05-11 MED ORDER — PANTOPRAZOLE SODIUM 40 MG PO TBEC
40.0000 mg | DELAYED_RELEASE_TABLET | Freq: Every day | ORAL | Status: DC
  Administered 2018-05-12 – 2018-05-13 (×2): 40 mg via ORAL
  Filled 2018-05-11 (×2): qty 1

## 2018-05-11 MED ORDER — ENOXAPARIN SODIUM 40 MG/0.4ML ~~LOC~~ SOLN
40.0000 mg | SUBCUTANEOUS | Status: DC
  Administered 2018-05-11: 40 mg via SUBCUTANEOUS
  Filled 2018-05-11: qty 0.4

## 2018-05-11 NOTE — H&P (Signed)
Cardiology Admission History and Physical:   Patient ID: Joshua Wyatt; MRN: [id number]; DOB: 16-Jul-1946   Admission date: 05/11/2018  Primary Care Provider: Jani Gravel, MD Primary Cardiologist: [name], MD   Chief Complaint:  PVD with claudication  Patient Profile:   Joshua Wyatt is a 72 y.o. male with a history of PVD and coronary artery disease.  History of Present Illness:   Joshua Wyatt is a 72 yr old male with a past medical history significant for coronary artery disease status post RCA ostial drug-eluting stent placement on 03/13/2018.  He has a history of bilateral carotid endarterectomies.  He also has a history of peripheral arterial disease status post multiple interventions to his left leg most recently on 02/03/2017.  He also has a history of right renal artery stenting.  He last saw Dr. Gwenlyn Found on 04/08/2018.  He had been complaining of lifestyle limiting left calf claudication.  Dopplers on 04/01/2018 revealed a high-frequency signal in the mid left SFA.  The patient denies any symptoms of chest pain, palpitations, shortness of breath, lightheadedness, dizziness, leg swelling, orthopnea, PND, and syncope.  His primary complaint relates to left leg pain with exertion and left foot numbness.  He is here for intervention planned for 05/12/2018 with IV hydration before hand.   Past Medical History:  Diagnosis Date  . Bilateral calf pain 03-02-2013   lower ext.doppler-mild arterial insufficiency to lower ext. this is a disease in val  . CAD (coronary artery disease)    a. cath 02/24/18 - 90% ost RCA; 40% ost & pro LAD  . Cerebral atherosclerosis 03-02-2013   carotid duplex- normal study  . GERD (gastroesophageal reflux disease)   . History of echocardiogram 08-02-2005   normal study   . History of kidney stones   . Hyperlipemia   . Hypertension 03-30-2013   PV Angiogram- rt. renal artery stent was widely open,50-60% lt. renal artery stenosis,lt. SFA stents patent with high  grade above the knee  poplital stenosis  . Hyponatremia 02/03/2017  . Normal cardiac stress test 03-25-2013   EF 60%, normal stress test ,this is a presurgical  test  . Peripheral vascular disease ([place])   . PVD (peripheral vascular disease) ([place])    A. 2009: S/P PTA/stent to D SFA. B. 01/2017: angiosculpt atherectomy/drug-eluting balloon angioplasty to L SFA.  [name] Renal artery stenosis ([place]) 03-02-2013   renal artery doppler-this was an abnormal doppler    Past Surgical History:  Procedure Laterality Date  . APPENDECTOMY  1960's  . CAROTID ENDARTERECTOMY Bilateral 2000's  . CORONARY ANGIOPLASTY WITH STENT PLACEMENT  03/13/2018  . CORONARY STENT INTERVENTION N/A 03/13/2018   Procedure: CORONARY STENT INTERVENTION;  Surgeon: Lorretta Harp, MD;  Location: [place] [facility];  Service: Cardiovascular;  Laterality: N/A;  . CYSTOSCOPY W/ STONE MANIPULATION  "X 4 or 5"  . LEFT HEART CATH AND CORONARY ANGIOGRAPHY N/A 02/24/2018   Procedure: LEFT HEART CATH AND CORONARY ANGIOGRAPHY;  Surgeon: Lorretta Harp, MD;  Location: [place] [facility];  Service: Cardiovascular;  Laterality: N/A;  . LITHOTRIPSY  "several"  . LOWER EXTREMITY ANGIOGRAM N/A 10/11/2014   Procedure: LOWER EXTREMITY ANGIOGRAM;  Surgeon: Lorretta Harp, MD;  Location: [facility] CATH LAB;  Service: Cardiovascular;  Laterality: N/A;  . LOWER EXTREMITY ANGIOGRAPHY N/A 02/04/2017   Procedure: Lower Extremity Angiography;  Surgeon: Lorretta Harp, MD;  Location: [place] [facility];  Service: Cardiovascular;  Laterality: N/A;  . PERCUTANEOUS STENT INTERVENTION  10/11/2014  Procedure: PERCUTANEOUS STENT INTERVENTION;  Surgeon: Lorretta Harp, MD;  Location: [facility] CATH LAB;  Service: Cardiovascular;;  right sfax2  . PERIPHERAL VASCULAR CATHETERIZATION N/A 01/03/2017   Procedure: Carotid PTA/Stent Intervention / Right;  Surgeon: Lorretta Harp, MD;  Location: [place] [facility];  Service: Cardiovascular;  Laterality: N/A;  . PERIPHERAL VASCULAR  INTERVENTION Left 02/04/2017   Procedure: Peripheral Vascular Intervention;  Surgeon: Lorretta Harp, MD;  Location: [place] [facility];  Service: Cardiovascular;  Laterality: Left;  left SFA  . POPLITEAL ARTERY ANGIOPLASTY Left 03/30/2013  . TONSILLECTOMY  1960's     Medications Prior to Admission: Prior to Admission medications   Medication Sig Start Date End Date Taking? Authorizing Provider  amLODipine (NORVASC) 10 MG tablet Take 1 tablet (10 mg total) by mouth daily. 02/25/18  Yes Bhagat, Bhavinkumar, PA  aspirin 81 MG tablet Take 81 mg by mouth daily.   Yes [provider]  atorvastatin (LIPITOR) 40 MG tablet Take 40 mg by mouth at bedtime.    Yes [provider]  clopidogrel (PLAVIX) 75 MG tablet Take 1 tablet (75 mg total) by mouth daily with breakfast. 03/15/18  Yes Lyda Jester M, PA-C  ezetimibe (ZETIA) 10 MG tablet Take 1 tablet (10 mg total) by mouth daily. 01/06/18  Yes Lorretta Harp, MD  irbesartan (AVAPRO) 300 MG tablet Take 1 tablet (300 mg total) by mouth daily. Patient taking differently: Take 300 mg by mouth at bedtime.  12/16/17  Yes Lorretta Harp, MD  nitroGLYCERIN (NITROSTAT) 0.4 MG SL tablet Place 1 tablet (0.4 mg total) under the tongue every 5 (five) minutes as needed. Patient taking differently: Place 0.4 mg under the tongue every 5 (five) minutes as needed for chest pain.  02/25/18  Yes Bhagat, Bhavinkumar, PA  pantoprazole (PROTONIX) 40 MG tablet Take 1 tablet (40 mg total) by mouth daily. 11/06/17  Yes Lorretta Harp, MD  tamsulosin (FLOMAX) 0.4 MG CAPS capsule Take 0.4 mg by mouth at bedtime.    Yes [provider]     Allergies:    Allergies  Allergen Reactions  . Statins Swelling and Other (See Comments)    Muscle pain    Social History:   Social History   Socioeconomic History  . Marital status: Married    Spouse name: Not on file  . Number of children: Not on file  . Years of education: Not on file  . Highest  education level: Not on file  Occupational History  . Not on file  Social Needs  . Financial resource strain: Not on file  . Food insecurity:    Worry: Not on file    Inability: Not on file  . Transportation needs:    Medical: Not on file    Non-medical: Not on file  Tobacco Use  . Smoking status: Former Smoker    Packs/day: 1.00    Years: 58.00    Pack years: 58.00    Types: Cigars, Cigarettes    Last attempt to quit: 10/18/2013    Years since quitting: 4.5  . Smokeless tobacco: Never Used  Substance and Sexual Activity  . Alcohol use: No  . Drug use: No  . Sexual activity: Yes  Lifestyle  . Physical activity:    Days per week: Not on file    Minutes per session: Not on file  . Stress: Not on file  Relationships  . Social connections:    Talks on phone: Not on file  Gets together: Not on file    Attends religious service: Not on file    Active member of club or organization: Not on file    Attends meetings of clubs or organizations: Not on file    Relationship status: Not on file  . Intimate partner violence:    Fear of current or ex partner: Not on file    Emotionally abused: Not on file    Physically abused: Not on file    Forced sexual activity: Not on file  Other Topics Concern  . Not on file  Social History Narrative  . Not on file    Family History:   The patient's family history includes Healthy in his brother and sister; Heart disease in his father.    ROS:  Please see the history of present illness.  All other ROS reviewed and negative.     Physical Exam/Data:   Vitals:   05/11/18 1513 05/11/18 1525  BP: (!) 155/72   Pulse: 85   Resp: 20   Temp: 97.6 F (36.4 C)   SpO2: 99%   Weight:  152 lb 12.8 oz (69.3 kg)  Height:  $Remove'5\' 7"'oItrRXP$  (1.702 m)   No intake or output data in the 24 hours ending 05/11/18 1639 Filed Weights   05/11/18 1525  Weight: 152 lb 12.8 oz (69.3 kg)   Body mass index is 23.93 kg/m.  General:  Well nourished, well  developed, in no acute distress HEENT: normal Lymph: no adenopathy Neck: no JVD Endocrine:  No thryomegaly Cardiac:  normal S1, S2; RRR; 2/6 systolic murmur loudest over right upper sternal border. Lungs:  clear to auscultation bilaterally, no wheezing, rhonchi or rales  Abd: soft, nontender, no hepatomegaly  Ext: no edema Musculoskeletal:  No deformities, BUE and BLE strength normal and equal Skin: warm and dry  Neuro:  no focal abnormalities noted Psych:  Normal affect    EKG: None this admission  Relevant CV Studies: Angiography and intervention planned for 05/12/2018  Laboratory Data:  Chemistry Recent Labs  Lab 05/08/18 1221  NA 140  K 4.6  CL 104  CO2 18*  GLUCOSE 103*  BUN 18  CREATININE 1.54*  CALCIUM 9.7  GFRNONAA 44*  GFRAA 51*    No results for input(s): PROT, ALBUMIN, AST, ALT, ALKPHOS, BILITOT in the last 168 hours. Hematology Recent Labs  Lab 05/08/18 1221  WBC 5.3  RBC 4.05*  HGB 11.4*  HCT 35.5*  MCV 88  MCH 28.1  MCHC 32.1  RDW 13.9  PLT 210   Cardiac EnzymesNo results for input(s): TROPONINI in the last 168 hours. No results for input(s): TROPIPOC in the last 168 hours.  BNPNo results for input(s): BNP, PROBNP in the last 168 hours.  DDimer No results for input(s): DDIMER in the last 168 hours.  Radiology/Studies:  No results found.  Assessment and Plan:   1.  Peripheral arterial disease with lifestyle limiting left calf claudication: He is on aspirin and statin therapy.  Lower extremity angiography and intervention is planned for tomorrow.  He will require IV hydration before hand.  Most recent creatinine 1.54 on 05/08/2018.  I will obtain a basic metabolic panel today as well as a CBC.  2.  Coronary artery disease status post RCA stent: Symptomatically stable.  Continue aspirin, Plavix, and Lipitor.  3.  Hypertension: Blood pressure is elevated.  Continue amlodipine 10 mg and Avapro 300 mg.  4.  Hyperlipidemia: Continue Zetia and  Lipitor 40 mg.  LDL 77  on 02/03/2018.   Severity of Illness: The appropriate patient status for this patient is INPATIENT. Inpatient status is judged to be reasonable and necessary in order to provide the required intensity of service to ensure the patient's safety. The patient's presenting symptoms, physical exam findings, and initial radiographic and laboratory data in the context of their chronic comorbidities is felt to place them at high risk for further clinical deterioration. Furthermore, it is not anticipated that the patient will be medically stable for discharge from the hospital within 2 midnights of admission. The following factors support the patient status of inpatient.   " The patient's presenting symptoms include claudication.  " The chronic co-morbidities include coronary artery disease, hypertension, and hyperlipidemia.   * I certify that at the point of admission it is my clinical judgment that the patient will require inpatient hospital care spanning beyond 2 midnights from the point of admission due to high intensity of service, high risk for further deterioration and high frequency of surveillance required.    For questions or updates, please contact [place] Please consult [email or web address] for contact info under Cardiology/STEMI.    Signed, Kate Sable, MD  05/11/2018 4:39 PM

## 2018-05-12 ENCOUNTER — Encounter (HOSPITAL_COMMUNITY): Payer: Self-pay | Admitting: Cardiovascular Disease

## 2018-05-12 ENCOUNTER — Encounter (HOSPITAL_COMMUNITY)
Admission: RE | Disposition: A | Discharge status: Home / Self Care | Payer: Self-pay | Source: Ambulatory Visit | Attending: Cardiovascular Disease

## 2018-05-12 DIAGNOSIS — Z79899 Other long term (current) drug therapy: Secondary | ICD-10-CM | POA: Diagnosis not present

## 2018-05-12 DIAGNOSIS — K219 Gastro-esophageal reflux disease without esophagitis: Secondary | ICD-10-CM | POA: Diagnosis not present

## 2018-05-12 DIAGNOSIS — Z87442 Personal history of urinary calculi: Secondary | ICD-10-CM | POA: Diagnosis not present

## 2018-05-12 DIAGNOSIS — Z955 Presence of coronary angioplasty implant and graft: Secondary | ICD-10-CM | POA: Diagnosis not present

## 2018-05-12 DIAGNOSIS — I10 Essential (primary) hypertension: Secondary | ICD-10-CM | POA: Diagnosis not present

## 2018-05-12 DIAGNOSIS — E782 Mixed hyperlipidemia: Secondary | ICD-10-CM | POA: Diagnosis not present

## 2018-05-12 DIAGNOSIS — I739 Peripheral vascular disease, unspecified: Secondary | ICD-10-CM | POA: Diagnosis not present

## 2018-05-12 DIAGNOSIS — Z87891 Personal history of nicotine dependence: Secondary | ICD-10-CM | POA: Diagnosis not present

## 2018-05-12 DIAGNOSIS — Z8249 Family history of ischemic heart disease and other diseases of the circulatory system: Secondary | ICD-10-CM | POA: Diagnosis not present

## 2018-05-12 DIAGNOSIS — I70212 Atherosclerosis of native arteries of extremities with intermittent claudication, left leg: Secondary | ICD-10-CM | POA: Diagnosis not present

## 2018-05-12 DIAGNOSIS — Z888 Allergy status to other drugs, medicaments and biological substances status: Secondary | ICD-10-CM | POA: Diagnosis not present

## 2018-05-12 DIAGNOSIS — Z9889 Other specified postprocedural states: Secondary | ICD-10-CM | POA: Diagnosis not present

## 2018-05-12 DIAGNOSIS — Z7902 Long term (current) use of antithrombotics/antiplatelets: Secondary | ICD-10-CM | POA: Diagnosis not present

## 2018-05-12 DIAGNOSIS — Z7982 Long term (current) use of aspirin: Secondary | ICD-10-CM | POA: Diagnosis not present

## 2018-05-12 HISTORY — PX: LOWER EXTREMITY ANGIOGRAPHY: CATH118251

## 2018-05-12 HISTORY — PX: PERIPHERAL VASCULAR ATHERECTOMY: CATH118256

## 2018-05-12 LAB — BASIC METABOLIC PANEL
Anion gap: 6 (ref 5–15)
BUN: 16 mg/dL (ref 6–20)
CHLORIDE: 110 mmol/L (ref 101–111)
CO2: 24 mmol/L (ref 22–32)
CREATININE: 1.27 mg/dL — AB (ref 0.61–1.24)
Calcium: 9.5 mg/dL (ref 8.9–10.3)
GFR calc Af Amer: >60 mL/min (ref >60)
GFR calc non Af Amer: 54 mL/min — ABNORMAL LOW (ref >60)
GLUCOSE: 99 mg/dL (ref 65–99)
POTASSIUM: 4.5 mmol/L (ref 3.5–5.1)
Sodium: 140 mmol/L (ref 135–145)

## 2018-05-12 LAB — POCT ACTIVATED CLOTTING TIME
ACTIVATED CLOTTING TIME: 246 s
ACTIVATED CLOTTING TIME: 257 s
ACTIVATED CLOTTING TIME: 153 s
Activated Clotting Time: 191 seconds

## 2018-05-12 SURGERY — LOWER EXTREMITY ANGIOGRAPHY
Anesthesia: LOCAL

## 2018-05-12 MED ORDER — HEPARIN (PORCINE) IN NACL 1000-0.9 UT/500ML-% IV SOLN
INTRAVENOUS | Status: AC
  Filled 2018-05-12: qty 1000

## 2018-05-12 MED ORDER — LABETALOL HCL 5 MG/ML IV SOLN
10.0000 mg | INTRAVENOUS | Status: DC | PRN
  Administered 2018-05-12: 12:00:00 10 mg via INTRAVENOUS
  Filled 2018-05-12: qty 4

## 2018-05-12 MED ORDER — CLOPIDOGREL BISULFATE 75 MG PO TABS: 75.0000 mg | ORAL_TABLET | Freq: Every day | ORAL | Status: DC

## 2018-05-12 MED ORDER — MORPHINE SULFATE (PF) 2 MG/ML IV SOLN
2.0000 mg | INTRAVENOUS | Status: DC | PRN
Start: 2018-05-12 — End: 2018-05-13

## 2018-05-12 MED ORDER — MIDAZOLAM HCL 2 MG/2ML IJ SOLN
INTRAMUSCULAR | Status: DC | PRN
  Administered 2018-05-12: 1 mg via INTRAVENOUS

## 2018-05-12 MED ORDER — FENTANYL CITRATE (PF) 100 MCG/2ML IJ SOLN
INTRAMUSCULAR | Status: DC | PRN
  Administered 2018-05-12: 25 ug via INTRAVENOUS

## 2018-05-12 MED ORDER — HEPARIN SODIUM (PORCINE) 1000 UNIT/ML IJ SOLN
INTRAMUSCULAR | Status: DC | PRN
  Administered 2018-05-12: 8000 [IU] via INTRAVENOUS
  Administered 2018-05-12: 2500 [IU] via INTRAVENOUS

## 2018-05-12 MED ORDER — SODIUM CHLORIDE 0.9% FLUSH: 3.0000 mL | Freq: Two times a day (BID) | INTRAVENOUS | Status: DC

## 2018-05-12 MED ORDER — SODIUM CHLORIDE 0.9 % IV SOLN: 250.0000 mL | INTRAVENOUS | Status: DC | PRN

## 2018-05-12 MED ORDER — FENTANYL CITRATE (PF) 100 MCG/2ML IJ SOLN
INTRAMUSCULAR | Status: AC
  Filled 2018-05-12: qty 2

## 2018-05-12 MED ORDER — HEPARIN (PORCINE) IN NACL 2-0.9 UNITS/ML
INTRAMUSCULAR | Status: AC | PRN
  Administered 2018-05-12: 500 mL

## 2018-05-12 MED ORDER — SODIUM CHLORIDE 0.9% FLUSH: 3.0000 mL | INTRAVENOUS | Status: DC | PRN

## 2018-05-12 MED ORDER — HYDRALAZINE HCL 20 MG/ML IJ SOLN
5.0000 mg | INTRAMUSCULAR | Status: DC | PRN
  Administered 2018-05-12: 5 mg via INTRAVENOUS
  Filled 2018-05-12: qty 1

## 2018-05-12 MED ORDER — LIDOCAINE HCL (PF) 1 % IJ SOLN
INTRAMUSCULAR | Status: DC | PRN
  Administered 2018-05-12: 20 mL

## 2018-05-12 MED ORDER — MIDAZOLAM HCL 2 MG/2ML IJ SOLN
INTRAMUSCULAR | Status: AC
  Filled 2018-05-12: qty 2

## 2018-05-12 MED ORDER — HEPARIN SODIUM (PORCINE) 1000 UNIT/ML IJ SOLN
INTRAMUSCULAR | Status: AC
  Filled 2018-05-12: qty 1

## 2018-05-12 MED ORDER — ONDANSETRON HCL 4 MG/2ML IJ SOLN
4.0000 mg | Freq: Four times a day (QID) | INTRAMUSCULAR | Status: DC | PRN
  Administered 2018-05-12: 4 mg via INTRAVENOUS

## 2018-05-12 MED ORDER — LIDOCAINE HCL (PF) 1 % IJ SOLN
INTRAMUSCULAR | Status: AC
  Filled 2018-05-12: qty 30

## 2018-05-12 MED ORDER — ASPIRIN EC 81 MG PO TBEC
81.0000 mg | DELAYED_RELEASE_TABLET | Freq: Every day | ORAL | Status: DC
  Administered 2018-05-13: 09:00:00 81 mg via ORAL
  Filled 2018-05-12: qty 1

## 2018-05-12 MED ORDER — SODIUM CHLORIDE 0.9 % IV BOLUS: 250.0000 mL | Freq: Once | INTRAVENOUS | Status: DC

## 2018-05-12 MED ORDER — ANGIOPLASTY BOOK
Freq: Once | Status: AC
  Administered 2018-05-12: 21:00:00 1
  Filled 2018-05-12: qty 1

## 2018-05-12 MED ORDER — IODIXANOL 320 MG/ML IV SOLN
INTRAVENOUS | Status: DC | PRN
  Administered 2018-05-12: 150 mL via INTRA_ARTERIAL

## 2018-05-12 MED ORDER — ACETAMINOPHEN 325 MG PO TABS: 650.0000 mg | ORAL_TABLET | ORAL | Status: DC | PRN

## 2018-05-12 MED ORDER — MORPHINE SULFATE (PF) 10 MG/ML IV SOLN: 2.0000 mg | INTRAVENOUS | Status: DC | PRN

## 2018-05-12 MED ORDER — SODIUM CHLORIDE 0.9 % IV SOLN: INTRAVENOUS | Status: AC

## 2018-05-12 SURGICAL SUPPLY — 26 items
BALLN COYOTE OTW 2.5X100X150 (BALLOONS) ×3
BALLN IN.PACT DCB 5X150 (BALLOONS) ×3
BALLOON COYOTE OTW 2.5X100X150 (BALLOONS) IMPLANT
CATH ANGIO 5F PIGTAIL 65CM (CATHETERS) ×1 IMPLANT
CATH HAWKONE LX EXTENDED TIP (CATHETERS) ×1 IMPLANT
CATH QUICKCROSS .018X135CM (MICROCATHETER) ×1 IMPLANT
CATH STRAIGHT 5FR 65CM (CATHETERS) ×1 IMPLANT
CATH TEMPO 5F RIM 65CM (CATHETERS) ×1 IMPLANT
DCB IN.PACT 5X150 (BALLOONS) IMPLANT
DEVICE SPIDERFX EMB PROT 6MM (WIRE) ×1 IMPLANT
GUIDEWIRE ANGLED .035X150CM (WIRE) ×1 IMPLANT
KIT ENCORE 26 ADVANTAGE (KITS) ×1 IMPLANT
KIT PV (KITS) ×3 IMPLANT
SHEATH AVANTI 11CM 5FR (SHEATH) ×1 IMPLANT
SHEATH HIGHFLEX ANSEL 7FR 55CM (SHEATH) ×1 IMPLANT
SHEATH PINNACLE 7F 10CM (SHEATH) ×1 IMPLANT
STOPCOCK MORSE 400PSI 3WAY (MISCELLANEOUS) ×1 IMPLANT
SYRINGE MEDRAD AVANTA MACH 7 (SYRINGE) ×1 IMPLANT
TAPE VIPERTRACK RADIOPAQ (MISCELLANEOUS) IMPLANT
TAPE VIPERTRACK RADIOPAQUE (MISCELLANEOUS) ×3
TRANSDUCER W/STOPCOCK (MISCELLANEOUS) ×3 IMPLANT
TRAY PV CATH (CUSTOM PROCEDURE TRAY) ×3 IMPLANT
TUBING CIL FLEX 10 FLL-RA (TUBING) ×1 IMPLANT
WIRE HITORQ VERSACORE ST 145CM (WIRE) ×1 IMPLANT
WIRE ROSEN-J .035X260CM (WIRE) ×1 IMPLANT
WIRE SPARTACORE .014X300CM (WIRE) ×1 IMPLANT

## 2018-05-12 NOTE — Progress Notes (Addendum)
Pressure elevated, given patient's daily medications, including amlodipine 10 mg at 1152. Given labetalol 10 mg IV at 1154 for BP 170/89, sinus rhythm in the 70"s. 1218 165/87, sinus rhythm in the 60's, given $RemoveBef'5mg'sUiqmGrFTa$  hydralazine.   Site area: right groin  Site Prior to Removal:  Level 0  Pressure Applied For 60 MINUTES    Minutes Beginning at 1230, BP 139/72, SR 68.  Manual:   Yes.    Patient Status During Pull:  See note below  Post Pull Groin Site:  Level 0  Post Pull Instructions Given:  Yes.    Post Pull Pulses Present:  Yes.    Dressing Applied:  Yes.    Comments: Patient had sheath pull at 1230, pressure held, no complications with pull, patient comfortable and vss. 1250 pressure let up on site and immediately bleeding noted from site, and pressure held. Tolerated well by patient. Continued pressure held, noted BP 106/68 at 1300. Patient denied any symptoms at that time, heart rate unchanged and remained in the 60's. Knees elevated at 1305 when noticed heart rate down into the high 50's. Attempted to let pressure off of site at 1305, no bleeding for approx 10 seconds and then bleeding noted. Resumed holding pressure. 1310 patient's color pale, complained of feeling a little nausea, started NS bolus of 250 mls. Pressure 106/59, sinus brady in the 50's. Called for assistance and zofran 4 mg given at 1312. Pressure held for 20 minutes until 1330. Stable site with no bleeding. Dressing applied and site monitored closely. Patient stated that nausea had decreased some, color pink, vss. 1350 VSS, patient denies nausea, tolerating ginger ale, wants to eat lunch. Site stable, level 0, dressing dry and intact.  1450. VSS patient tolerated lunch, denies nausea, Site stable. Only complaint is that groin site sore.

## 2018-05-12 NOTE — Plan of Care (Signed)
Patient has remained stabled throughout the night. Not distress noted. Continued to monitor for safety and comfort

## 2018-05-12 NOTE — Interval H&P Note (Signed)
History and Physical Interval Note:  05/12/2018 7:51 AM  Joshua Wyatt  has presented today for surgery, with the diagnosis of claudication  The various methods of treatment have been discussed with the patient and family. After consideration of risks, benefits and other options for treatment, the patient has consented to  Procedure(s): LOWER EXTREMITY ANGIOGRAPHY (N/A) as a surgical intervention .  The patient's history has been reviewed, patient examined, no change in status, stable for surgery.  I have reviewed the patient's chart and labs.  Questions were answered to the patient's satisfaction.     [name]

## 2018-05-13 ENCOUNTER — Other Ambulatory Visit: Payer: Self-pay | Admitting: Physician Assistant

## 2018-05-13 DIAGNOSIS — Z87442 Personal history of urinary calculi: Secondary | ICD-10-CM | POA: Diagnosis not present

## 2018-05-13 DIAGNOSIS — Z79899 Other long term (current) drug therapy: Secondary | ICD-10-CM | POA: Diagnosis not present

## 2018-05-13 DIAGNOSIS — Z8249 Family history of ischemic heart disease and other diseases of the circulatory system: Secondary | ICD-10-CM | POA: Diagnosis not present

## 2018-05-13 DIAGNOSIS — Z7982 Long term (current) use of aspirin: Secondary | ICD-10-CM | POA: Diagnosis not present

## 2018-05-13 DIAGNOSIS — K219 Gastro-esophageal reflux disease without esophagitis: Secondary | ICD-10-CM | POA: Diagnosis not present

## 2018-05-13 DIAGNOSIS — Z87891 Personal history of nicotine dependence: Secondary | ICD-10-CM | POA: Diagnosis not present

## 2018-05-13 DIAGNOSIS — I70212 Atherosclerosis of native arteries of extremities with intermittent claudication, left leg: Secondary | ICD-10-CM | POA: Diagnosis not present

## 2018-05-13 DIAGNOSIS — I10 Essential (primary) hypertension: Secondary | ICD-10-CM | POA: Diagnosis not present

## 2018-05-13 DIAGNOSIS — Z9889 Other specified postprocedural states: Secondary | ICD-10-CM | POA: Diagnosis not present

## 2018-05-13 DIAGNOSIS — I739 Peripheral vascular disease, unspecified: Secondary | ICD-10-CM

## 2018-05-13 DIAGNOSIS — E782 Mixed hyperlipidemia: Secondary | ICD-10-CM | POA: Diagnosis not present

## 2018-05-13 DIAGNOSIS — Z7902 Long term (current) use of antithrombotics/antiplatelets: Secondary | ICD-10-CM | POA: Diagnosis not present

## 2018-05-13 DIAGNOSIS — Z888 Allergy status to other drugs, medicaments and biological substances status: Secondary | ICD-10-CM | POA: Diagnosis not present

## 2018-05-13 DIAGNOSIS — Z955 Presence of coronary angioplasty implant and graft: Secondary | ICD-10-CM | POA: Diagnosis not present

## 2018-05-13 LAB — BASIC METABOLIC PANEL
Anion gap: 9 (ref 5–15)
BUN: 15 mg/dL (ref 6–20)
CALCIUM: 9.3 mg/dL (ref 8.9–10.3)
CO2: 23 mmol/L (ref 22–32)
CREATININE: 1.2 mg/dL (ref 0.61–1.24)
Chloride: 104 mmol/L (ref 101–111)
GFR calc Af Amer: >60 mL/min (ref >60)
GFR, EST NON AFRICAN AMERICAN: 58 mL/min — AB (ref >60)
GLUCOSE: 92 mg/dL (ref 65–99)
Potassium: 4.2 mmol/L (ref 3.5–5.1)
Sodium: 136 mmol/L (ref 135–145)

## 2018-05-13 LAB — CBC
HCT: 32.4 % — ABNORMAL LOW (ref 39.0–52.0)
HEMOGLOBIN: 10.2 g/dL — AB (ref 13.0–17.0)
MCH: 27.9 pg (ref 26.0–34.0)
MCHC: 31.5 g/dL (ref 30.0–36.0)
MCV: 88.8 fL (ref 78.0–100.0)
PLATELETS: 192 10*3/uL (ref 150–400)
RBC: 3.65 MIL/uL — ABNORMAL LOW (ref 4.22–5.81)
RDW: 12.7 % (ref 11.5–15.5)
WBC: 6.8 10*3/uL (ref 4.0–10.5)

## 2018-05-13 MED ORDER — IRBESARTAN 300 MG PO TABS: 300.0000 mg | ORAL_TABLET | Freq: Every day | ORAL | 11 refills | Status: DC

## 2018-05-13 NOTE — Discharge Instructions (Signed)
TAKE BIOTIN THIS MORNING ATORVASTATIN (LIPITOR) AT 6PM DO NOT TAKE IRBESARTAN (AVAPRO) AT 6 PM TODAY TAKE TAMSULOSIN (FLOMAX) TONIGHT AT 1000 PM  TOMORROW RESTART TAKING ALL YOU MEDICATIONS AS SCHEDULED

## 2018-05-13 NOTE — Progress Notes (Signed)
Patient ambulated in hall, tolerated well. Stated his leg felt much better after the procedure. No shortness of breath, chest pain, lightheadedness or dizziness. Steady gait, favoring one side because he has bone on bone in his knee, he stated.

## 2018-05-13 NOTE — Progress Notes (Addendum)
Progress Note  Patient Name: Joshua Wyatt Date of Encounter: 05/13/2018  Primary Cardiologist: [name], MD   Subjective   Pt doing well this morning, no complaints. Groin site C/D/I, no hematoma.  Inpatient Medications    Scheduled Meds: . amLODipine  10 mg Oral Daily  . aspirin EC  81 mg Oral Daily  . atorvastatin  40 mg Oral QHS  . clopidogrel  75 mg Oral Q breakfast  . ezetimibe  10 mg Oral Daily  . pantoprazole  40 mg Oral Daily  . sodium chloride flush  3 mL Intravenous Q12H  . tamsulosin  0.4 mg Oral QHS   Continuous Infusions: . sodium chloride    . sodium chloride 999 mL/hr at 05/12/18 1310   PRN Meds: sodium chloride, acetaminophen, ALPRAZolam, hydrALAZINE, labetalol, morphine injection, nitroGLYCERIN, ondansetron (ZOFRAN) IV, ondansetron (ZOFRAN) IV, sodium chloride flush, zolpidem   Vital Signs    Vitals:   05/12/18 1700 05/12/18 1930 05/12/18 1941 05/13/18 0536  BP: (!) 150/56  (!) 163/68 (!) 160/70  Pulse: 66 71 69 68  Resp: 15 (!) $Remo'24 11 10  'HTErG$ Temp:   98.1 F (36.7 C) 98.2 F (36.8 C)  TempSrc:   Oral Oral  SpO2: 97% 94% 96% 94%  Weight:    153 lb 14.1 oz (69.8 kg)  Height:        Intake/Output Summary (Last 24 hours) at 05/13/2018 0742 Last data filed at 05/13/2018 0229 Gross per 24 hour  Intake 1839.94 ml  Output 1625 ml  Net 214.94 ml   Filed Weights   05/11/18 1525 05/12/18 0441 05/13/18 0536  Weight: 152 lb 12.8 oz (69.3 kg) 152 lb 3.2 oz (69 kg) 153 lb 14.1 oz (69.8 kg)    Telemetry    sinus - Personally Reviewed  ECG    No new tracings - Personally Reviewed  Physical Exam   GEN: No acute distress.   Neck: No JVD Cardiac: RRR, no murmurs, rubs, or gallops. Groin site C/D/I, no hematoma Respiratory: Clear to auscultation bilaterally. GI: Soft, nontender, non-distended  MS: No edema; No deformity. Neuro:  Nonfocal  Psych: Normal affect   Labs    Chemistry Recent Labs  Lab 05/11/18 1702 05/12/18 0441 05/13/18 0330   NA 135 140 136  K 3.4* 4.5 4.2  CL 105 110 104  CO2 $Re'22 24 23  'gol$ GLUCOSE 109* 99 92  BUN $Re'20 16 15  'RHM$ CREATININE 1.44* 1.27* 1.20  CALCIUM 9.1 9.5 9.3  GFRNONAA 47* 54* 58*  GFRAA 54* >60 >60  ANIONGAP $RemoveB'8 6 9     'FbAfMard$ Hematology Recent Labs  Lab 05/08/18 1221 05/11/18 1702 05/13/18 0330  WBC 5.3 6.2 6.8  RBC 4.05* 3.83* 3.65*  HGB 11.4* 10.8* 10.2*  HCT 35.5* 33.2* 32.4*  MCV 88 86.7 88.8  MCH 28.1 28.2 27.9  MCHC 32.1 32.5 31.5  RDW 13.9 12.2 12.7  PLT 210 203 192    Cardiac EnzymesNo results for input(s): TROPONINI in the last 168 hours. No results for input(s): TROPIPOC in the last 168 hours.   BNPNo results for input(s): BNP, PROBNP in the last 168 hours.   DDimer No results for input(s): DDIMER in the last 168 hours.   Radiology    No results found.  Cardiac Studies   Lower extremity angiography 05/12/18: Angiographic Data:  1: Abdominal aorta- moderate atherosclerotic changes 2: Right lower extremity- there was a 60% proximal right common iliac artery stenosis and a 50% right external iliac artery stenosis  3: Left lower extremity- there were minor irregularities in the iliac system.  There is a long segmental 90 to 95% stenosis in the proximal left SFA.  The mid left SFA had minor irregularities in the distal SFA had a widely patent previously instrumented self-expanding stent.  There was two-vessel runoff with a subtotally occluded anterior tibial artery.  Final Impression: Successful Hawk 1 directional atherectomy followed by drug-eluting balloon angioplasty using spider distal protection of a high-grade segmental proximal left SFA stenosis for lifestyle limiting claudication.  The sheath will be removed once the ACT falls below 170 and pressure held.  Patient will be hydrated overnight and discharged home in the morning.  We will obtain lower extremity arterial Doppler studies in our [facility] line office next week and I will see him back several weeks thereafter.  He left  the lab in stable condition.  Patient Profile     72 y.o. male with a history of PVD and coronary artery disease s/p DES to RCA 03/13/18, carotid stenosis s/p bilateral CEA.  Assessment & Plan    1. PAD 90-95% stenosis in the proximal mid left SFA s/p directional atherectomy and drug-eluting balloon angioplasty - Patient tolerated the procedure well, sheath was removed - he has not complaints this morning and is ready for discharge - discharge on ASA and plavix   2.  CAD s/p DES to RCA  - on ASA and statin as below - no chest pain   3. HTN - on norvasc and avapro - pressures have been controlled, elevated this morning prior to HTN medications   4. HLD - 02/03/2018: Cholesterol, Total 155; HDL 36; LDL Calculated 77; Triglycerides 212 - LDL goal is less than 70 - on zetia and lipitor - repeat fasting lipids in the office with LFTs    5. CKD stage III - he was admitted for overnight hydration prior to procedure - sCr today is 1.20 - will restart avapro tomorrow   For questions or updates, please contact [place] Please consult [email or web address] for contact info under Cardiology/STEMI.      Signed, Tami Lin Duke, PA  05/13/2018, 7:42 AM    Patient seen and examined. Agree with assessment and plan. Feels well. R groin site stable. Day 1 s/p successful Hawk 1 directional atherectomy followed by drug-eluting balloon angioplasty using spider distal protection of a high-grade segmental proximal left SFA stenosis for lifestyle limiting claudication. No chest pain. Good distal pulses. DC today; f/u Dr. Gwenlyn Found.   Troy Sine, MD, [facility] 05/13/2018 9:23 AM

## 2018-05-13 NOTE — Discharge Summary (Signed)
Discharge Summary    Patient ID: Joshua Wyatt,  MRN: [id number], DOB/AGE: 1946-01-31 72 y.o.  Admit date: 05/11/2018 Discharge date: 05/13/2018  Primary Care Provider: Jani Gravel Primary Cardiologist: [name], MD  Discharge Diagnoses    Principal Problem:   PAD (peripheral artery disease) ([place]) Active Problems:   PVD (peripheral vascular disease) ([place])   Severe claudication (HCC)   Mixed hyperlipidemia   Bilateral carotid artery disease ([place])   CKD (chronic kidney disease) stage 3, GFR 30-59 ml/min (HCC)   CAD (coronary artery disease)   Allergies Allergies  Allergen Reactions  . Statins Swelling and Other (See Comments)    Muscle pain, also    Diagnostic Studies/Procedures    Lower extremity angiography 05/12/18: Angiographic Data:  1: Abdominal aorta- moderate atherosclerotic changes 2: Right lower extremity- there was a 60% proximal right common iliac artery stenosis and a 50% right external iliac artery stenosis 3: Left lower extremity- there were minor irregularities in the iliac system. There is a long segmental 90 to 95% stenosis in the proximal left SFA. The mid left SFA had minor irregularities in the distal SFA had a widely patent previously instrumented self-expanding stent. There was two-vessel runoff with a subtotally occluded anterior tibial artery.  Final Impression:Successful [place] 1 directional atherectomy followed by drug-eluting balloon angioplasty using spider distal protection of a high-grade segmental proximal left SFA stenosis for lifestyle limiting claudication. The sheath will be removed once the ACT falls below 170 and pressure held. Patient will be hydrated overnight and discharged home in the morning. We will obtain lower extremity arterial Doppler studies in our [facility] line office next week and I will see him back several weeks thereafter. He left the lab in stable condition.   History of Present Illness     Pt is a 72 y.o. male  with a history of PVDand coronary artery disease s/p DES to RCA 03/13/18, carotid stenosis s/p bilateral CEA, hx of right renal artery stenting, and CKD stage III.  He last saw Dr. Gwenlyn Found on 04/08/2018.  He had been complaining of lifestyle limiting left calf claudication.  Dopplers on 04/01/2018 revealed a high-frequency signal in the mid left SFA.  The patient denied any symptoms of chest pain, palpitations, shortness of breath, lightheadedness, dizziness, leg swelling, orthopnea, PND, and syncope.  His primary complaint related to left leg pain with exertion and left foot numbness.  He was admitted 05/11/18 for IV hydration for his CKD stage III prior to intervention planned for 05/12/2018.    Hospital Course     Consultants: none  Patient was admitted for IV hydration. sCr prior to procedure was 1.44. On 05/12/18, he was taken to the cath lab for lower extremity angiography. He was found to have 90-95% stenosis in the proximal mid left SFA s/p directional atherectomy and drug-eluting balloon angioplasty. He tolerated the procedure well and sheath was removed per protocol.   He recovered overnight and serum creatinine today was 1.20. He denies groin pain, chest pain, SOB, and feelings of syncope. Right groin site is C/D/I without hematoma.   He was started on plavix along with his 81 mg ASA. He will continue lipitor and zetia. LDL goal is less than 70. 02/03/2018: Cholesterol, Total 155; HDL 36; LDL Calculated 77; Triglycerides 212 Repeat fasting lipids and LFTs at outpatient follow up and increase lipitor as needed. Continue zetia.   Pressures have been labile. Avapro was held prior to admission for renal function. Will resume avapro tomorrow.  _____________  Discharge Vitals Blood pressure 107/62, pulse 69, temperature 98.5 F (36.9 C), temperature source Oral, resp. rate 15, height $RemoveBe'5\' 7"'RwWCbcEkv$  (1.702 m), weight 153 lb 14.1 oz (69.8 kg), SpO2 95 %.  Filed Weights   05/11/18 1525 05/12/18 0441  05/13/18 0536  Weight: 152 lb 12.8 oz (69.3 kg) 152 lb 3.2 oz (69 kg) 153 lb 14.1 oz (69.8 kg)    Labs & Radiologic Studies    CBC Recent Labs    05/11/18 1702 05/13/18 0330  WBC 6.2 6.8  HGB 10.8* 10.2*  HCT 33.2* 32.4*  MCV 86.7 88.8  PLT 203 384   Basic Metabolic Panel Recent Labs    05/12/18 0441 05/13/18 0330  NA 140 136  K 4.5 4.2  CL 110 104  CO2 24 23  GLUCOSE 99 92  BUN 16 15  CREATININE 1.27* 1.20  CALCIUM 9.5 9.3   Liver Function Tests No results for input(s): AST, ALT, ALKPHOS, BILITOT, PROT, ALBUMIN in the last 72 hours. No results for input(s): LIPASE, AMYLASE in the last 72 hours. Cardiac Enzymes No results for input(s): CKTOTAL, CKMB, CKMBINDEX, TROPONINI in the last 72 hours. BNP Invalid input(s): POCBNP D-Dimer No results for input(s): DDIMER in the last 72 hours. Hemoglobin A1C No results for input(s): HGBA1C in the last 72 hours. Fasting Lipid Panel No results for input(s): CHOL, HDL, LDLCALC, TRIG, CHOLHDL, LDLDIRECT in the last 72 hours. Thyroid Function Tests No results for input(s): TSH, T4TOTAL, T3FREE, THYROIDAB in the last 72 hours.  Invalid input(s): FREET3 _____________  No results found. Disposition   Pt is being discharged home today in good condition.  Follow-up Plans & Appointments     Discharge Instructions    Diet - low sodium heart healthy   Complete by:  As directed    Discharge instructions   Complete by:  As directed    No driving for 2 days.  Keep procedure site clean & dry. If you notice increased pain, swelling, bleeding or pus, call/return!  You may shower, but no soaking baths/hot tubs/pools for 1 week.   Increase activity slowly   Complete by:  As directed       Discharge Medications   Allergies as of 05/13/2018      Reactions   Statins Swelling, Other (See Comments)   Muscle pain, also      Medication List    TAKE these medications   amLODipine 10 MG tablet Commonly known as:  NORVASC Take 1  tablet (10 mg total) by mouth daily. Notes to patient:  Lowers blood pressure Decreases chest pain    aspirin 81 MG tablet Take 81 mg by mouth daily. Notes to patient:  Prevents clotting in arteries   atorvastatin 40 MG tablet Commonly known as:  LIPITOR Take 40 mg by mouth every evening. Notes to patient:  Lowers cholesterol   Biotin 5000 MCG Caps Take 5,000 mcg by mouth daily.   clopidogrel 75 MG tablet Commonly known as:  PLAVIX Take 1 tablet (75 mg total) by mouth daily with breakfast. Notes to patient:  Prevents clotting in arteries    ezetimibe 10 MG tablet Commonly known as:  ZETIA Take 1 tablet (10 mg total) by mouth daily. Notes to patient:  Lowers cholesterol    irbesartan 300 MG tablet Commonly known as:  AVAPRO Take 1 tablet (300 mg total) by mouth daily. Resume on 05/14/18. What changed:  additional instructions Notes to patient:  Lowers cholesterol    nitroGLYCERIN 0.4 MG  SL tablet Commonly known as:  NITROSTAT Place 1 tablet (0.4 mg total) under the tongue every 5 (five) minutes as needed. What changed:  reasons to take this Notes to patient:  As needed for chest pain    pantoprazole 40 MG tablet Commonly known as:  PROTONIX Take 1 tablet (40 mg total) by mouth daily. Notes to patient:  Treat acid reflux Prevents heartburn    tamsulosin 0.4 MG Caps capsule Commonly known as:  FLOMAX Take 0.4 mg by mouth at bedtime. Notes to patient:  Prostate         Outstanding Labs/Studies   Lower extremity arterial doppler studies at [facility] next week  F/U with Dr. Gwenlyn Found 4 weeks after that  Duration of Discharge Encounter   Greater than 30 minutes including physician time.  Signed, [place], [place] 05/13/2018, 9:35 AM

## 2018-05-13 NOTE — Care Management Obs Status (Signed)
[facility] NOTIFICATION   Patient Details  Name: JAROLD MACOMBER MRN: [id number] Date of Birth: [birth year]   Medicare Observation Status Notification Given:  Yes    Zenon Mayo, RN 05/13/2018, 10:30 AM

## 2018-05-20 ENCOUNTER — Ambulatory Visit (HOSPITAL_COMMUNITY): Admission: RE | Admit: 2018-05-20 | Payer: Medicare Other | Source: Ambulatory Visit

## 2018-05-27 ENCOUNTER — Ambulatory Visit (HOSPITAL_COMMUNITY)
Admission: RE | Admit: 2018-05-27 | Discharge: 2018-05-27 | Disposition: A | Discharge status: Home / Self Care | Payer: Medicare Other | Source: Ambulatory Visit | Attending: Cardiovascular Disease | Admitting: Cardiovascular Disease

## 2018-05-27 DIAGNOSIS — E785 Hyperlipidemia, unspecified: Secondary | ICD-10-CM | POA: Diagnosis not present

## 2018-05-27 DIAGNOSIS — I739 Peripheral vascular disease, unspecified: Secondary | ICD-10-CM

## 2018-05-27 DIAGNOSIS — I2510 Atherosclerotic heart disease of native coronary artery without angina pectoris: Secondary | ICD-10-CM | POA: Diagnosis not present

## 2018-05-27 DIAGNOSIS — Z87891 Personal history of nicotine dependence: Secondary | ICD-10-CM | POA: Diagnosis not present

## 2018-05-27 DIAGNOSIS — I10 Essential (primary) hypertension: Secondary | ICD-10-CM | POA: Insufficient documentation

## 2018-05-27 DIAGNOSIS — Z95820 Peripheral vascular angioplasty status with implants and grafts: Secondary | ICD-10-CM | POA: Diagnosis not present

## 2018-06-11 ENCOUNTER — Ambulatory Visit (INDEPENDENT_AMBULATORY_CARE_PROVIDER_SITE_OTHER): Payer: Medicare Other | Admitting: Cardiovascular Disease

## 2018-06-11 ENCOUNTER — Encounter: Payer: Self-pay | Admitting: Cardiovascular Disease

## 2018-06-11 VITALS — BP 140/78 | HR 80 | Ht 67.0 in | Wt 156.0 lb

## 2018-06-11 DIAGNOSIS — I6523 Occlusion and stenosis of bilateral carotid arteries: Secondary | ICD-10-CM | POA: Diagnosis not present

## 2018-06-11 DIAGNOSIS — I739 Peripheral vascular disease, unspecified: Secondary | ICD-10-CM

## 2018-06-11 NOTE — Assessment & Plan Note (Signed)
Joshua Wyatt returns today for his first post hospital follow-up visit after procedure which was performed 05/11/2018.  He has had prior left SFA stenting.  He had intervention on the right side as well.  Because of left calf claudication and Dopplers that suggested high-grade disease I angiogram him 05/11/2018 revealing 95% segmental proximal left SFA stenosis with a patent distal left SFA stent and two-vessel runoff.  I performed [facility] 1 directional atherectomy followed by drug-eluting balloon angioplasty.  His ABIs improved to .79 from 0.69 and his claudication has resolved

## 2018-06-11 NOTE — Progress Notes (Signed)
06/11/2018 KELSON QUEENAN   [birth year]  [id number]  Primary Physician Jani Gravel, MD Primary Cardiologist: Lorretta Harp MD FACP, [place], [place], [place]  HPI:  Joshua Wyatt is a 72 y.o. male thin appearing married Caucasian male with no children who I last saw in the office4/30/2019... He has a history of PVOD status post bilateral carotid endarterectomies performed by Dr. Drucie Opitz in 2007. We have been following him by duplex ultrasound which performed most recently several days ago revealed this to be widely patent. He has also had bilateral SFA intervention as well as right renal artery stenting. His other problems include hypertension and hyperlipidemia. He did have an episode of chest heaviness recently, but more importantly he has complained of progressive lifestyle limiting claudication since I last saw him. Most recent lower extremity Dopplers reveal a right ABI of 0.93 and a left of 0.82. He does appear to have moderate iliac disease as well as high grade right SFA and popliteal stenosis. He underwent abdominal aortography with bifemoral runoff on 03/30/13 revealed a widely patent right renal artery stent, 50-60% proximal left renal artery stenosis. The left SFA was while he stayed patent and he had a 95% focal above-the-knee popliteal artery stenosis with three-vessel runoff. There was a 60% focal lesion in the right common iliac artery and a 50% segmental proximal to mid right SFA stenosis with a patent stent in the mid to distal right SFA. He also had a 50% right popliteal stenosis with three-vessel runoff. He underwent cutting balloon angioplasty of the left above-the-knee popliteal artery and Angiosculpt balloon with an excellent angiographic result and was discharged the following day. I performed angiography on him 10/11/14 and recanalized a subtotally occluded right SFA with overlapping via Bahn covered stent performed intervention on his above-the-knee popliteal artery on that side. He  had a patent mid to distal left SFA stent with high grade segmental and 6 sequential disease in the proximal SFA and distal SFA on the left. Since I saw him a year and a half ago he's had progressive lifestyle limiting claudication in his left calf. Addition, he's had severe progression of disease in his right internal carotid artery. I performed carotid stenting on him 01/03/17 with an excellent result. He underwent left lower extremity angiography by myself 02/03/17 revealing an occluded distal left SFA stent with otherwise high-grade segmental disease proximal to this and occluded anterior tibial. I performed cutting balloon atherectomy of his in-stent restenosis and the surrounding disease excellent angiographic result. His claudication has completely resolved. His left ABI has improved from 0.59 up to 0.  He was admitted to the hospital for accelerated unstable angina on 02/24/18 performed left heart cath via the right radial approach revealing a high-grade ostial dominant RCA stenosis anterior takeoff only visualized using an AR-1 catheter.  I performed RCA ostial CI and drug-eluting stenting 03/13/2018 which resulted in marked improvement in his angina and shortness of breath. He also complains of left calf lifestyle limiting claudication and had SFA intervention by myself February 2018. Recent Dopplers performed 04/01/2018 revealed decreased left ABI with a high-frequency signal in his mid left SFA.  Because of elevated serum creatinine he was admitted yesterday for hydration.  His serum creatinine was 1.27 this morning with a creatinine clearance of 45 cc/min.  He presents now for peripheral angiography and potential intervention for lifestyle limiting claudication  He underwent peripheral angiography 05/11/2018 revealing high-grade segmental proximal left SFA stenosis which I atherectomized using a [facility]  one device performed drug-eluting balloon angioplasty.  His follow-up Doppler studies improved and  his claudication has resolved.   Current Meds  Medication Sig  . amLODipine (NORVASC) 10 MG tablet Take 1 tablet (10 mg total) by mouth daily.  [name] aspirin 81 MG tablet Take 81 mg by mouth daily.  [name] atorvastatin (LIPITOR) 40 MG tablet Take 40 mg by mouth every evening.   . Biotin 5000 MCG CAPS Take 5,000 mcg by mouth daily.  . clopidogrel (PLAVIX) 75 MG tablet Take 1 tablet (75 mg total) by mouth daily with breakfast.  . ezetimibe (ZETIA) 10 MG tablet Take 1 tablet (10 mg total) by mouth daily.  . irbesartan (AVAPRO) 300 MG tablet Take 1 tablet (300 mg total) by mouth daily. Resume on 05/14/18.  [name] nitroGLYCERIN (NITROSTAT) 0.4 MG SL tablet Place 1 tablet (0.4 mg total) under the tongue every 5 (five) minutes as needed. (Patient taking differently: Place 0.4 mg under the tongue every 5 (five) minutes as needed for chest pain. )  . pantoprazole (PROTONIX) 40 MG tablet Take 1 tablet (40 mg total) by mouth daily.  . tamsulosin (FLOMAX) 0.4 MG CAPS capsule Take 0.4 mg by mouth at bedtime.      Allergies  Allergen Reactions  . Statins Swelling and Other (See Comments)    Muscle pain, also    Social History   Socioeconomic History  . Marital status: Married    Spouse name: Not on file  . Number of children: Not on file  . Years of education: Not on file  . Highest education level: Not on file  Occupational History  . Not on file  Social Needs  . Financial resource strain: Not on file  . Food insecurity:    Worry: Not on file    Inability: Not on file  . Transportation needs:    Medical: Not on file    Non-medical: Not on file  Tobacco Use  . Smoking status: Former Smoker    Packs/day: 1.00    Years: 58.00    Pack years: 58.00    Types: Cigars, Cigarettes    Last attempt to quit: 10/18/2013    Years since quitting: 4.6  . Smokeless tobacco: Never Used  Substance and Sexual Activity  . Alcohol use: Not Currently  . Drug use: Never  . Sexual activity: Yes  Lifestyle  .  Physical activity:    Days per week: Not on file    Minutes per session: Not on file  . Stress: Not on file  Relationships  . Social connections:    Talks on phone: Not on file    Gets together: Not on file    Attends religious service: Not on file    Active member of club or organization: Not on file    Attends meetings of clubs or organizations: Not on file    Relationship status: Not on file  . Intimate partner violence:    Fear of current or ex partner: Not on file    Emotionally abused: Not on file    Physically abused: Not on file    Forced sexual activity: Not on file  Other Topics Concern  . Not on file  Social History Narrative  . Not on file     Review of Systems: General: negative for chills, fever, night sweats or weight changes.  Cardiovascular: negative for chest pain, dyspnea on exertion, edema, orthopnea, palpitations, paroxysmal nocturnal dyspnea or shortness of breath Dermatological: negative for rash Respiratory: negative  for cough or wheezing Urologic: negative for hematuria Abdominal: negative for nausea, vomiting, diarrhea, bright red blood per rectum, melena, or hematemesis Neurologic: negative for visual changes, syncope, or dizziness All other systems reviewed and are otherwise negative except as noted above.    Blood pressure 140/78, pulse 80, height $RemoveBe'5\' 7"'DVAXSPblK$  (1.702 m), weight 156 lb (70.8 kg).  General appearance: alert and no distress Neck: no adenopathy, no JVD, supple, symmetrical, trachea midline, thyroid not enlarged, symmetric, no tenderness/mass/nodules and Soft bilateral carotid bruits Lungs: clear to auscultation bilaterally Heart: regular rate and rhythm, S1, S2 normal, no murmur, click, rub or gallop Extremities: extremities normal, atraumatic, no cyanosis or edema Pulses: 2+ and symmetric Skin: Skin color, texture, turgor normal. No rashes or lesions Neurologic: Alert and oriented X 3, normal strength and tone. Normal symmetric reflexes.  Normal coordination and gait  EKG not performed today  ASSESSMENT AND PLAN:   PVD (peripheral vascular disease) [facility]) Mr. Zorn returns today for his first post hospital follow-up visit after procedure which was performed 05/11/2018.  He has had prior left SFA stenting.  He had intervention on the right side as well.  Because of left calf claudication and Dopplers that suggested high-grade disease I angiogram him 05/11/2018 revealing 95% segmental proximal left SFA stenosis with a patent distal left SFA stent and two-vessel runoff.  I performed [facility] 1 directional atherectomy followed by drug-eluting balloon angioplasty.  His ABIs improved to .79 from 0.69 and his claudication has resolved      Lorretta Harp MD [facility], [facility] 06/11/2018 10:35 AM

## 2018-06-11 NOTE — Patient Instructions (Addendum)
Medication Instructions: Your physician recommends that you continue on your current medications as directed. Please refer to the Current Medication list given to you today.   Testing/Procedures: Your physician has requested that you have a carotid duplex. This test is an ultrasound of the carotid arteries in your neck. It looks at blood flow through these arteries that supply the brain with blood. Allow one hour for this exam. There are no restrictions or special instructions.  Every 6 months: Your physician has requested that you have a lower extremity arterial duplex. During this test, ultrasound is used to evaluate arterial blood flow in the legs. Allow one hour for this exam. There are no restrictions or special instructions.  Your physician has requested that you have an ankle brachial index (ABI). During this test an ultrasound and blood pressure cuff are used to evaluate the arteries that supply the arms and legs with blood. Allow thirty minutes for this exam. There are no restrictions or special instructions.  Follow-Up: Your physician wants you to follow-up in: 1 year with Dr. Gwenlyn Found. You will receive a reminder letter in the mail two months in advance. If you don't receive a letter, please call our office to schedule the follow-up appointment.  If you need a refill on your cardiac medications before your next appointment, please call your pharmacy.

## 2018-06-19 ENCOUNTER — Ambulatory Visit (HOSPITAL_COMMUNITY)
Admission: RE | Admit: 2018-06-19 | Payer: Medicare Other | Source: Ambulatory Visit | Attending: Cardiovascular Disease | Admitting: Cardiovascular Disease

## 2018-07-01 DIAGNOSIS — E785 Hyperlipidemia, unspecified: Secondary | ICD-10-CM | POA: Diagnosis not present

## 2018-07-01 DIAGNOSIS — I739 Peripheral vascular disease, unspecified: Secondary | ICD-10-CM | POA: Diagnosis not present

## 2018-07-01 DIAGNOSIS — Z0000 Encounter for general adult medical examination without abnormal findings: Secondary | ICD-10-CM | POA: Diagnosis not present

## 2018-07-01 DIAGNOSIS — E876 Hypokalemia: Secondary | ICD-10-CM | POA: Diagnosis not present

## 2018-07-01 DIAGNOSIS — R300 Dysuria: Secondary | ICD-10-CM | POA: Diagnosis not present

## 2018-07-01 DIAGNOSIS — I10 Essential (primary) hypertension: Secondary | ICD-10-CM | POA: Diagnosis not present

## 2018-07-01 DIAGNOSIS — I2510 Atherosclerotic heart disease of native coronary artery without angina pectoris: Secondary | ICD-10-CM | POA: Diagnosis not present

## 2018-07-08 ENCOUNTER — Encounter: Payer: Self-pay | Admitting: Cardiovascular Disease

## 2018-07-08 ENCOUNTER — Ambulatory Visit (INDEPENDENT_AMBULATORY_CARE_PROVIDER_SITE_OTHER): Payer: Medicare Other | Admitting: Cardiovascular Disease

## 2018-07-08 VITALS — BP 130/80 | HR 69 | Ht 67.0 in | Wt 155.0 lb

## 2018-07-08 DIAGNOSIS — I739 Peripheral vascular disease, unspecified: Secondary | ICD-10-CM

## 2018-07-08 NOTE — Patient Instructions (Signed)

## 2018-07-14 DIAGNOSIS — I10 Essential (primary) hypertension: Secondary | ICD-10-CM | POA: Diagnosis not present

## 2018-07-14 DIAGNOSIS — Z125 Encounter for screening for malignant neoplasm of prostate: Secondary | ICD-10-CM | POA: Diagnosis not present

## 2018-07-14 DIAGNOSIS — Z0000 Encounter for general adult medical examination without abnormal findings: Secondary | ICD-10-CM | POA: Diagnosis not present

## 2018-07-24 DIAGNOSIS — I739 Peripheral vascular disease, unspecified: Secondary | ICD-10-CM | POA: Diagnosis not present

## 2018-07-24 DIAGNOSIS — M810 Age-related osteoporosis without current pathological fracture: Secondary | ICD-10-CM | POA: Diagnosis not present

## 2018-07-24 DIAGNOSIS — Z0000 Encounter for general adult medical examination without abnormal findings: Secondary | ICD-10-CM | POA: Diagnosis not present

## 2018-07-24 DIAGNOSIS — I10 Essential (primary) hypertension: Secondary | ICD-10-CM | POA: Diagnosis not present

## 2018-07-24 DIAGNOSIS — E785 Hyperlipidemia, unspecified: Secondary | ICD-10-CM | POA: Diagnosis not present

## 2018-08-25 ENCOUNTER — Other Ambulatory Visit: Payer: Self-pay | Admitting: Physician Assistant

## 2018-09-09 DIAGNOSIS — M810 Age-related osteoporosis without current pathological fracture: Secondary | ICD-10-CM | POA: Diagnosis not present

## 2018-09-15 DIAGNOSIS — M19042 Primary osteoarthritis, left hand: Secondary | ICD-10-CM | POA: Diagnosis not present

## 2018-09-16 DIAGNOSIS — M79642 Pain in left hand: Secondary | ICD-10-CM | POA: Diagnosis not present

## 2018-10-21 DIAGNOSIS — M1712 Unilateral primary osteoarthritis, left knee: Secondary | ICD-10-CM | POA: Diagnosis not present

## 2018-10-21 DIAGNOSIS — M1711 Unilateral primary osteoarthritis, right knee: Secondary | ICD-10-CM | POA: Diagnosis not present

## 2018-10-21 DIAGNOSIS — M170 Bilateral primary osteoarthritis of knee: Secondary | ICD-10-CM | POA: Diagnosis not present

## 2018-10-29 ENCOUNTER — Telehealth: Payer: Self-pay | Admitting: *Deleted

## 2018-10-29 NOTE — Telephone Encounter (Signed)
   [facility] Pre-operative Risk Assessment    Request for surgical clearance:  1. What type of surgery is being performed? Right Total Knee   2. When is this surgery scheduled? Pending   3. What type of clearance is required (medical clearance vs. Pharmacy clearance to hold med vs. Both)? Medical  4. Are there any medications that need to be held prior to surgery and how long?Plavix   5. Practice name and name of physician performing surgery? [facility] Joshua Wyatt   6. What is your office phone number [phone] Attn: Joshua Wyatt    7.   What is your office fax number [phone] Attn:         Joshua Wyatt  8.   Anesthesia type (None, local, MAC, general) ?       Joshua Wyatt 10/29/2018, 2:43 PM  _________________________________________________________________   (provider comments below)

## 2018-10-31 NOTE — Telephone Encounter (Signed)
Dr. Gwenlyn Found, This patient needs a right knee replacement and they are requesting to hold plavix. This patient had DES to RCA 03/13/18 and DES to left SFA 05/12/18. Please address when would it be safe to hold Plavix.   Please route response back to P CV DIV PREOP  Thanks, Gae Bon

## 2018-11-05 NOTE — Telephone Encounter (Signed)
Can hold Plavix after April of next year

## 2018-11-05 NOTE — Telephone Encounter (Signed)
SPOKE WITH PT TO LET THEM KNOW UNFORTNATELY DR BERRY SCHEDULE IS NOT OPEN.   HE WILL RECEIVE A RECALL LETTER IN THE MAIL TO MAKE AN APPT IN April FOR KNEE SURGERY CLEARANCE.

## 2018-11-05 NOTE — Telephone Encounter (Signed)
   Primary Cardiologist:Jonathan Gwenlyn Found, MD  Chart reviewed as part of pre-operative protocol coverage.   The patient cannot hold his Plavix until after 03/2019.  Therefore his surgery will have to be postponed until then.  He should have a follow up in 03/2019 for surgical clearance and to make a determination regarding his Plavix.  Pre-op covering staff: - Please schedule appointment with Dr. Gwenlyn Found for April 2020 and call patient to inform him. - Please contact requesting surgeon's office and notify them that the patient cannot hold Plavix until April 2020.  His surgery will have to be postponed until after then.  This note can be faxed to the surgeon's office if they need communication in writing.   This note will be removed from the preop pool.  Richardson Dopp, PA-C  11/05/2018, 1:42 PM

## 2018-11-18 ENCOUNTER — Other Ambulatory Visit: Payer: Self-pay | Admitting: Internal Medicine

## 2018-11-18 DIAGNOSIS — E782 Mixed hyperlipidemia: Secondary | ICD-10-CM

## 2018-12-09 ENCOUNTER — Telehealth: Payer: Self-pay | Admitting: Cardiovascular Disease

## 2018-12-09 MED ORDER — PANTOPRAZOLE SODIUM 40 MG PO TBEC: 40.0000 mg | DELAYED_RELEASE_TABLET | Freq: Every day | ORAL | 3 refills | Status: DC

## 2018-12-09 NOTE — Telephone Encounter (Signed)
Refill sent to pharmacy.   

## 2018-12-09 NOTE — Telephone Encounter (Signed)
°*  STAT* If patient is at the pharmacy, call can be transferred to refill team.   1. Which medications need to be refilled? (please list name of each medication and dose if known) pantoprazole 40 mg  2. Which pharmacy/location (including street and city if local pharmacy) is medication to be sent to? [facility] on [address]  3. Do they need a 30 day or 90 day supply? [place]

## 2018-12-12 ENCOUNTER — Ambulatory Visit (HOSPITAL_COMMUNITY)
Admission: RE | Admit: 2018-12-12 | Discharge: 2018-12-12 | Disposition: A | Discharge status: Home / Self Care | Payer: Medicare Other | Source: Ambulatory Visit | Attending: Cardiovascular Disease | Admitting: Cardiovascular Disease

## 2018-12-12 ENCOUNTER — Ambulatory Visit (HOSPITAL_BASED_OUTPATIENT_CLINIC_OR_DEPARTMENT_OTHER)
Admission: RE | Admit: 2018-12-12 | Discharge: 2018-12-12 | Disposition: A | Discharge status: Home / Self Care | Payer: Medicare Other | Source: Ambulatory Visit | Attending: Cardiovascular Disease | Admitting: Cardiovascular Disease

## 2018-12-12 DIAGNOSIS — I739 Peripheral vascular disease, unspecified: Secondary | ICD-10-CM | POA: Diagnosis not present

## 2018-12-12 DIAGNOSIS — I6523 Occlusion and stenosis of bilateral carotid arteries: Secondary | ICD-10-CM | POA: Diagnosis not present

## 2018-12-16 ENCOUNTER — Other Ambulatory Visit: Payer: Self-pay | Admitting: *Deleted

## 2018-12-16 ENCOUNTER — Telehealth: Payer: Self-pay | Admitting: *Deleted

## 2018-12-16 DIAGNOSIS — I739 Peripheral vascular disease, unspecified: Secondary | ICD-10-CM

## 2018-12-16 DIAGNOSIS — I6523 Occlusion and stenosis of bilateral carotid arteries: Secondary | ICD-10-CM

## 2018-12-16 NOTE — Telephone Encounter (Signed)
SPOKE TO  PATIENT TO INFORM HIM  AN APPOINTMENT IS NEEDED FOR April 2020 (PER LAST PRE OP ENCOUNTER 11/22019)  TO MAKE A DECISION FOR CLEARANCE.  PATIENT STATES HE  HAS AN APPOINTMENT SCHEDULE FOR 12/17/18 WITH DR BERRY.   RN LEFT TELEPHONE MESSAGE TO SCHEDULER AT  [place] TO DR [facility] NURSE

## 2018-12-17 ENCOUNTER — Ambulatory Visit (INDEPENDENT_AMBULATORY_CARE_PROVIDER_SITE_OTHER): Payer: Medicare Other | Admitting: Cardiovascular Disease

## 2018-12-17 ENCOUNTER — Encounter: Payer: Self-pay | Admitting: Cardiovascular Disease

## 2018-12-17 DIAGNOSIS — E782 Mixed hyperlipidemia: Secondary | ICD-10-CM | POA: Diagnosis not present

## 2018-12-17 DIAGNOSIS — I6523 Occlusion and stenosis of bilateral carotid arteries: Secondary | ICD-10-CM

## 2018-12-17 DIAGNOSIS — I739 Peripheral vascular disease, unspecified: Secondary | ICD-10-CM | POA: Diagnosis not present

## 2018-12-17 DIAGNOSIS — Z01818 Encounter for other preprocedural examination: Secondary | ICD-10-CM | POA: Diagnosis not present

## 2018-12-17 DIAGNOSIS — I10 Essential (primary) hypertension: Secondary | ICD-10-CM

## 2018-12-17 NOTE — Patient Instructions (Addendum)
Medication Instructions:   STOP TAKING YOUR PLAVIX 1 WEEK PRIOR TO YOUR PROCEDURE/SURGERY.  YOU MAY RESUME TAKING YOUR PLAVIX AFTER YOU HAVE BEEN CLEARED BY YOUR SURGEON TO DO SO.  If you need a refill on your cardiac medications before your next appointment, please call your pharmacy.   Lab work: NONE If you have labs (blood work) drawn today and your tests are completely normal, you will receive your results only by: [name] MyChart Message (if you have MyChart) OR . A paper copy in the mail If you have any lab test that is abnormal or we need to change your treatment, we will call you to review the results.  Testing/Procedures: Your physician has requested that you have a lower or upper extremity arterial duplex. This test is an ultrasound of the arteries in the legs or arms. It looks at arterial blood flow in the legs and arms. Allow one hour for Lower and Upper Arterial scans. There are no restrictions or special instructions. SCHEDULE January 2021  Your physician has requested that you have an ankle brachial index (ABI). During this test an ultrasound and blood pressure cuff are used to evaluate the arteries that supply the arms and legs with blood. Allow thirty minutes for this exam. There are no restrictions or special instructions. SCHEDULE January 2021    Follow-Up: At [facility], you and your health needs are our priority.  As part of our continuing mission to provide you with exceptional heart care, we have created designated Provider Care Teams.  These Care Teams include your primary Cardiologist (physician) and Advanced Practice Providers (APPs -  Physician Assistants and Nurse Practitioners) who all work together to provide you with the care you need, when you need it. . You will need a follow up appointment in 9 months.  Please call our office 2 months in advance to schedule this appointment.  You may see Dr. Gwenlyn Found or one of the following Advanced Practice Providers on your  designated Care Team:   . Kerin Ransom, [place] . Almyra Deforest, PA-C . Fabian Sharp, PA-C . Jory Sims, DNP . Rosaria Ferries, PA-C . Roby Lofts, PA-C . Sande Rives, PA-C  Any Other Special Instructions Will Be Listed Below (If Applicable). YOU ARE CLEARED FROM A CARDIAC STANDPOINT FOR YOUR UPCOMING PROCEDURE/SURGERY. YOUR PROCEDURE/SURGERY MAY BE SCHEDULED ANY TIME AFTER March 11, 2019.

## 2018-12-17 NOTE — Assessment & Plan Note (Signed)
History of hyperlipidemia on statin therapy with lipid profile performed 07/14/2018 revealing total cholesterol 155, LDL of 83 and HDL 40.

## 2018-12-17 NOTE — Assessment & Plan Note (Signed)
History of peripheral arterial disease status post bilateral SFA intervention in the past.  Because of left lower extremity claudication I angiogram him 05/11/2018 revealing a patent mid left SFA stent with high-grade diffuse proximal left SFA stenosis.  I performed [facility] 1 directional atherectomy followed by drug-eluting balloon angioplasty.  He had an excellent result.  Follow-up Dopplers performed 12/12/2018 revealed normal ABI on that side with normal velocities.  He denies claudication.

## 2018-12-17 NOTE — Progress Notes (Signed)
12/17/2018 Joshua Wyatt   Apr 29, 1946  [id number]  Primary Physician Jani Gravel, MD Primary Cardiologist: Lorretta Harp MD FACP, [place], [place], [place]  HPI:  Joshua Wyatt is a 73 y.o.   thin appearing married Caucasian male with no children who I last saw in the 06/11/2018... He has a history of PVOD status post bilateral carotid endarterectomies performed by Dr. Drucie Opitz in 2007. We have been following him by duplex ultrasound which performed most recently several days ago revealed this to be widely patent. He has also had bilateral SFA intervention as well as right renal artery stenting. His other problems include hypertension and hyperlipidemia. He did have an episode of chest heaviness recently, but more importantly he has complained of progressive lifestyle limiting claudication since I last saw him. Most recent lower extremity Dopplers reveal a right ABI of 0.93 and a left of 0.82. He does appear to have moderate iliac disease as well as high grade right SFA and popliteal stenosis. He underwent abdominal aortography with bifemoral runoff on 03/30/13 revealed a widely patent right renal artery stent, 50-60% proximal left renal artery stenosis. The left SFA was while he stayed patent and he had a 95% focal above-the-knee popliteal artery stenosis with three-vessel runoff. There was a 60% focal lesion in the right common iliac artery and a 50% segmental proximal to mid right SFA stenosis with a patent stent in the mid to distal right SFA. He also had a 50% right popliteal stenosis with three-vessel runoff. He underwent cutting balloon angioplasty of the left above-the-knee popliteal artery and Angiosculpt balloon with an excellent angiographic result and was discharged the following day. I performed angiography on him 10/11/14 and recanalized a subtotally occluded right SFA with overlapping via Bahn covered stent performed intervention on his above-the-knee popliteal artery on that side. He had a  patent mid to distal left SFA stent with high grade segmental and 6 sequential disease in the proximal SFA and distal SFA on the left. Since I saw him a year and a half ago he's had progressive lifestyle limiting claudication in his left calf. Addition, he's had severe progression of disease in his right internal carotid artery. I performed carotid stenting on him 01/03/17 with an excellent result. He underwent left lower extremity angiography by myself 02/03/17 revealing an occluded distal left SFA stent with otherwise high-grade segmental disease proximal to this and occluded anterior tibial. I performed cutting balloon atherectomy of his in-stent restenosis and the surrounding disease excellent angiographic result. His claudication has completely resolved. His left ABI has improved from 0.59 up to 1.  Because of recurrent claudication I re-angiogram him 05/11/2018 revealing a patent mid left SFA stent with high-grade segmental proximal left SFA stenosis which I performed [place] 1 directional atherectomy followed by drug-eluting balloon angioplasty.  Claudication resolved and his Dopplers have normalized.  He did have RCA intervention by myself 03/13/2018 with a drug-eluting stent.  He currently denies chest pain, shortness of breath or claudication.  He apparently needs elective right left total knee replacement by Dr. Susa Raring in the upcoming future which she will be cleared for at low risk.  He will be able to stop his Plavix 1 week prior to his procedures beginning in April of this year.   No outpatient medications have been marked as taking for the 12/17/18 encounter (Office Visit) with Lorretta Harp, MD.     Allergies  Allergen Reactions  . Statins Swelling and Other (See Comments)  Muscle pain, also    Social History   Socioeconomic History  . Marital status: Married    Spouse name: Not on file  . Number of children: Not on file  . Years of education: Not on file  . Highest education level:  Not on file  Occupational History  . Not on file  Social Needs  . Financial resource strain: Not on file  . Food insecurity:    Worry: Not on file    Inability: Not on file  . Transportation needs:    Medical: Not on file    Non-medical: Not on file  Tobacco Use  . Smoking status: Former Smoker    Packs/day: 1.00    Years: 58.00    Pack years: 58.00    Types: Cigars, Cigarettes    Last attempt to quit: 10/18/2013    Years since quitting: 5.1  . Smokeless tobacco: Never Used  Substance and Sexual Activity  . Alcohol use: Not Currently  . Drug use: Never  . Sexual activity: Yes  Lifestyle  . Physical activity:    Days per week: Not on file    Minutes per session: Not on file  . Stress: Not on file  Relationships  . Social connections:    Talks on phone: Not on file    Gets together: Not on file    Attends religious service: Not on file    Active member of club or organization: Not on file    Attends meetings of clubs or organizations: Not on file    Relationship status: Not on file  . Intimate partner violence:    Fear of current or ex partner: Not on file    Emotionally abused: Not on file    Physically abused: Not on file    Forced sexual activity: Not on file  Other Topics Concern  . Not on file  Social History Narrative  . Not on file     Review of Systems: General: negative for chills, fever, night sweats or weight changes.  Cardiovascular: negative for chest pain, dyspnea on exertion, edema, orthopnea, palpitations, paroxysmal nocturnal dyspnea or shortness of breath Dermatological: negative for rash Respiratory: negative for cough or wheezing Urologic: negative for hematuria Abdominal: negative for nausea, vomiting, diarrhea, bright red blood per rectum, melena, or hematemesis Neurologic: negative for visual changes, syncope, or dizziness All other systems reviewed and are otherwise negative except as noted above.    Blood pressure 138/68, pulse 72,  height $Remov'5\' 7"'rqlfPK$  (1.702 m).  General appearance: alert and no distress Neck: no adenopathy, no JVD, supple, symmetrical, trachea midline, thyroid not enlarged, symmetric, no tenderness/mass/nodules and Soft bilateral carotid bruits Lungs: clear to auscultation bilaterally Heart: regular rate and rhythm, S1, S2 normal, no murmur, click, rub or gallop Extremities: extremities normal, atraumatic, no cyanosis or edema Pulses: 2+ and symmetric Skin: Skin color, texture, turgor normal. No rashes or lesions Neurologic: Alert and oriented X 3, normal strength and tone. Normal symmetric reflexes. Normal coordination and gait  EKG sinus rhythm at 72 with nonspecific ST and T wave changes.  I personally reviewed this EKG.  ASSESSMENT AND PLAN:   Preoperative clearance Joshua Wyatt today for preoperative clearance before elective right and left total knee replacement by Dr. Gladstone Lighter.  He had RCA stenting by myself 03/13/2018 and is been asymptomatic since on aspirin and Plavix.  He had no other significant CAD.  He is cleared at low risk for his upcoming procedure.  He can stop his  Plavix week prior to his orthopedic procedure beginning 1 April.  PVD (peripheral vascular disease) ([place]) History of peripheral arterial disease status post bilateral SFA intervention in the past.  Because of left lower extremity claudication I angiogram him 05/11/2018 revealing a patent mid left SFA stent with high-grade diffuse proximal left SFA stenosis.  I performed [facility] 1 directional atherectomy followed by drug-eluting balloon angioplasty.  He had an excellent result.  Follow-up Dopplers performed 12/12/2018 revealed normal ABI on that side with normal velocities.  He denies claudication.  Mixed hyperlipidemia History of hyperlipidemia on statin therapy with lipid profile performed 07/14/2018 revealing total cholesterol 155, LDL of 83 and HDL 40.  Uncontrolled hypertension History of essential hypertension her blood pressure  measured today 138/68.  He is on amlodipine, and Avapro.  Continue current meds at current dosing.  Bilateral carotid artery disease ([place]) History of carotid artery disease status post bilateral carotid endarterectomies performed by Dr. Drucie Opitz in 2007.  Most recent carotid Dopplers performed 12/12/2018 revealed a widely patent right carotid stent and normal left internal carotid artery.  This will be repeated on an annual basis.      Lorretta Harp MD FACP,FACC,FAHA, [facility] 12/17/2018 11:33 AM

## 2018-12-17 NOTE — Assessment & Plan Note (Signed)
History of essential hypertension her blood pressure measured today 138/68.  He is on amlodipine, and Avapro.  Continue current meds at current dosing.

## 2018-12-17 NOTE — Assessment & Plan Note (Signed)
Joshua Wyatt presents today for preoperative clearance before elective right and left total knee replacement by Dr. Gladstone Lighter.  He had RCA stenting by myself 03/13/2018 and is been asymptomatic since on aspirin and Plavix.  He had no other significant CAD.  He is cleared at low risk for his upcoming procedure.  He can stop his Plavix week prior to his orthopedic procedure beginning 1 April.

## 2018-12-17 NOTE — Assessment & Plan Note (Signed)
History of carotid artery disease status post bilateral carotid endarterectomies performed by Dr. Drucie Opitz in 2007.  Most recent carotid Dopplers performed 12/12/2018 revealed a widely patent right carotid stent and normal left internal carotid artery.  This will be repeated on an annual basis.

## 2018-12-30 DIAGNOSIS — L438 Other lichen planus: Secondary | ICD-10-CM | POA: Diagnosis not present

## 2018-12-30 DIAGNOSIS — L821 Other seborrheic keratosis: Secondary | ICD-10-CM | POA: Diagnosis not present

## 2018-12-30 DIAGNOSIS — L570 Actinic keratosis: Secondary | ICD-10-CM | POA: Diagnosis not present

## 2018-12-30 DIAGNOSIS — L820 Inflamed seborrheic keratosis: Secondary | ICD-10-CM | POA: Diagnosis not present

## 2018-12-30 DIAGNOSIS — Z85828 Personal history of other malignant neoplasm of skin: Secondary | ICD-10-CM | POA: Diagnosis not present

## 2019-01-12 ENCOUNTER — Other Ambulatory Visit: Payer: Self-pay | Admitting: Cardiovascular Disease

## 2019-01-12 NOTE — Telephone Encounter (Signed)
Rx request sent to pharmacy.  

## 2019-01-25 ENCOUNTER — Other Ambulatory Visit: Payer: Self-pay | Admitting: Cardiovascular Disease

## 2019-02-02 ENCOUNTER — Other Ambulatory Visit: Payer: Self-pay | Admitting: Cardiovascular Disease

## 2019-02-03 ENCOUNTER — Other Ambulatory Visit: Payer: Self-pay | Admitting: Surgical

## 2019-03-18 ENCOUNTER — Ambulatory Visit: Admit: 2019-03-18 | Payer: Medicare Other | Admitting: Orthopedic Surgery

## 2019-03-18 SURGERY — ARTHROPLASTY, KNEE, TOTAL
Anesthesia: Choice | Laterality: Right

## 2019-04-10 ENCOUNTER — Other Ambulatory Visit: Payer: Self-pay | Admitting: Cardiovascular Disease

## 2019-04-11 NOTE — Telephone Encounter (Signed)
Clopidogrel refilled

## 2019-04-16 ENCOUNTER — Telehealth: Payer: Self-pay | Admitting: Cardiovascular Disease

## 2019-04-16 NOTE — Telephone Encounter (Signed)
New Message:    Amber PA calling concering this patient please call her at [phone]

## 2019-04-16 NOTE — Telephone Encounter (Signed)
Returned call to [employer] for clarification. Unable to get in contact. [facility] office tomorrow

## 2019-04-16 NOTE — Telephone Encounter (Signed)
Contacted Joshua Wyatt at number provided. LMTCB

## 2019-04-16 NOTE — Telephone Encounter (Signed)
Follow Up:   Amber wants to know if they used a Torniquet for pt's surgery?

## 2019-04-20 NOTE — Telephone Encounter (Signed)
Incoming call from Mining engineer. Amber from [employer] on [employer]. Call transferred. Amber states that pt has femoral stents and she needs clearance to use a tourniquet during  right total knee replacement surgery. Surgery date is 5/27. Contact number for Amber:  [phone]. Will route to Dr. Gwenlyn Found to advise

## 2019-04-20 NOTE — Telephone Encounter (Signed)
These stents are nitinol and cannot be crushed therefore no problem using tourniquets.

## 2019-04-21 NOTE — Telephone Encounter (Signed)
Lm for Amber to call back ./cy

## 2019-04-22 ENCOUNTER — Other Ambulatory Visit (HOSPITAL_COMMUNITY): Payer: Self-pay | Admitting: *Deleted

## 2019-04-22 NOTE — Telephone Encounter (Signed)
Lm to call back ./cy 

## 2019-04-22 NOTE — Progress Notes (Signed)
CARDIAC CLEARANCE 12-17-18 DR BERRY EKG 12-17-18 Epic CARDIAC CATH 03-13-18 EPIC PERIPHERAL VASCULAR CATH 05-12-18 EPIC

## 2019-04-22 NOTE — Patient Instructions (Addendum)
Joshua Wyatt    Your procedure is scheduled on: 05-06-2019  Report to [facility] Main  Entrance  Report to [place] at [place] [facility] TEST ON Friday 05-01-2019 AT 1110 AM.  THIS TEST MUST BE DONE BEFORE SURGERY, COME TO [place] ENTRANCE SEE MAP GIVEN.    Call this number if you have problems the morning of surgery [phone]    Remember:  [place], NO CHEWING GUM CANDY OR MINTS.   NO SOLID FOOD AFTER MIDNIGHT THE NIGHT PRIOR TO SURGERY. NOTHING BY MOUTH EXCEPT CLEAR LIQUIDS UNTIL 3 HOURS PRIOR TO [place] SURGERY. PLEASE FINISH ENSURE DRINK PER SURGEON ORDER 3 HOURS PRIOR TO SCHEDULED SURGERY TIME WHICH NEEDS TO BE COMPLETED AT 430 AM. NOTHING TO DRINK AFTER 430 AM.    CLEAR LIQUID DIET   Foods Allowed                                                                     Foods Excluded  Coffee and tea, regular and decaf                             liquids that you cannot  Plain Jell-O in any flavor                                             see through such as: Fruit ices (not with fruit pulp)                                     milk, soups, orange juice  Iced Popsicles                                    All solid food Carbonated beverages, regular and diet                                    Cranberry, grape and apple juices Sports drinks like Gatorade Lightly seasoned clear broth or consume(fat free) Sugar, honey syrup  Sample Menu Breakfast                                Lunch                                     Supper Cranberry juice                    Beef broth  Chicken broth Jell-O                                     Grape juice                           Apple juice Coffee or tea                        Jell-O                                      Popsicle                                                Coffee or tea                         Coffee or tea  _____________________________________________________________________    Take these medicines the morning of surgery with A SIP OF WATER: ZETIA, AMLODIPINE (NORVASC), TAMSULOSIN (FLOMAX) IF TAKEN THAT DAY, PANTAPRAZOLE (PROTONIX)                               You may not have any metal on your body including hair pins and              piercings  Do not wear jewelry, make-up, lotions, powders or perfumes, deodorant             Do not wear nail polish.  Do not shave  48 hours prior to surgery.              Men may shave face and neck.   Do not bring valuables to the hospital. [place].  Contacts, dentures or bridgework may not be worn into surgery.  Leave suitcase in the car. After surgery it may be brought to your room.     Patients discharged the day of surgery will not be allowed to drive home. IF YOU ARE HAVING SURGERY AND GOING HOME THE SAME DAY, YOU MUST HAVE AN ADULT TO DRIVE YOU HOME AND BE WITH YOU FOR 24 HOURS. YOU MAY GO HOME BY TAXI OR UBER OR ORTHERWISE, BUT AN ADULT MUST ACCOMPANY YOU HOME AND STAY WITH YOU FOR 24 HOURS.  Name and phone number of your driver:  Special Instructions: N/A              Please read over the following fact sheets you were given: _____________________________________________________________________             [facility] - Preparing for Surgery Before surgery, you can play an important role.  Because skin is not sterile, your skin needs to be as free of germs as possible.  You can reduce the number of germs on your skin by washing with CHG (chlorahexidine gluconate) soap before surgery.  CHG is an antiseptic cleaner which kills germs and bonds with the skin to continue killing germs even after washing. Please DO NOT  use if you have an allergy to CHG or antibacterial soaps.  If your skin becomes reddened/irritated stop using the CHG and inform your nurse when you  arrive at Short Stay. Do not shave (including legs and underarms) for at least 48 hours prior to the first CHG shower.  You may shave your face/neck. Please follow these instructions carefully:  1.  Shower with CHG Soap the night before surgery and the  morning of Surgery.  2.  If you choose to wash your hair, wash your hair first as usual with your  normal  shampoo.  3.  After you shampoo, rinse your hair and body thoroughly to remove the  shampoo.                           4.  Use CHG as you would any other liquid soap.  You can apply chg directly  to the skin and wash                       Gently with a scrungie or clean washcloth.  5.  Apply the CHG Soap to your body ONLY FROM THE NECK DOWN.   Do not use on face/ open                           Wound or open sores. Avoid contact with eyes, ears mouth and genitals (private parts).                       Wash face,  Genitals (private parts) with your normal soap.             6.  Wash thoroughly, paying special attention to the area where your surgery  will be performed.  7.  Thoroughly rinse your body with warm water from the neck down.  8.  DO NOT shower/wash with your normal soap after using and rinsing off  the CHG Soap.                9.  Pat yourself dry with a clean towel.            10.  Wear clean pajamas.            11.  Place clean sheets on your bed the night of your first shower and do not  sleep with pets. Day of Surgery : Do not apply any lotions/deodorants the morning of surgery.  Please wear clean clothes to the hospital/surgery center.  FAILURE TO FOLLOW THESE INSTRUCTIONS MAY RESULT IN THE CANCELLATION OF YOUR SURGERY PATIENT SIGNATURE_________________________________  NURSE SIGNATURE__________________________________  ________________________________________________________________________   Joshua Wyatt  An incentive spirometer is a tool that can help keep your lungs clear and active. This tool measures how  well you are filling your lungs with each breath. Taking long deep breaths may help reverse or decrease the chance of developing breathing (pulmonary) problems (especially infection) following:  A long period of time when you are unable to move or be active. BEFORE THE PROCEDURE   If the spirometer includes an indicator to show your best effort, your nurse or respiratory therapist will set it to a desired goal.  If possible, sit up straight or lean slightly forward. Try not to slouch.  Hold the incentive spirometer in an upright position. INSTRUCTIONS FOR USE  1. Sit on the edge of your bed if possible,  or sit up as far as you can in bed or on a chair. 2. Hold the incentive spirometer in an upright position. 3. Breathe out normally. 4. Place the mouthpiece in your mouth and seal your lips tightly around it. 5. Breathe in slowly and as deeply as possible, raising the piston or the ball toward the top of the column. 6. Hold your breath for 3-5 seconds or for as long as possible. Allow the piston or ball to fall to the bottom of the column. 7. Remove the mouthpiece from your mouth and breathe out normally. 8. Rest for a few seconds and repeat Steps 1 through 7 at least 10 times every 1-2 hours when you are awake. Take your time and take a few normal breaths between deep breaths. 9. The spirometer may include an indicator to show your best effort. Use the indicator as a goal to work toward during each repetition. 10. After each set of 10 deep breaths, practice coughing to be sure your lungs are clear. If you have an incision (the cut made at the time of surgery), support your incision when coughing by placing a pillow or rolled up towels firmly against it. Once you are able to get out of bed, walk around indoors and cough well. You may stop using the incentive spirometer when instructed by your caregiver.  RISKS AND COMPLICATIONS  Take your time so you do not get dizzy or light-headed.  If you  are in pain, you may need to take or ask for pain medication before doing incentive spirometry. It is harder to take a deep breath if you are having pain. AFTER USE  Rest and breathe slowly and easily.  It can be helpful to keep track of a log of your progress. Your caregiver can provide you with a simple table to help with this. If you are using the spirometer at home, follow these instructions: [place] IF:   You are having difficultly using the spirometer.  You have trouble using the spirometer as often as instructed.  Your pain medication is not giving enough relief while using the spirometer.  You develop fever of 100.5 F (38.1 C) or higher. SEEK IMMEDIATE MEDICAL CARE IF:   You cough up bloody sputum that had not been present before.  You develop fever of 102 F (38.9 C) or greater.  You develop worsening pain at or near the incision site. MAKE SURE YOU:   Understand these instructions.  Will watch your condition.  Will get help right away if you are not doing well or get worse. Document Released: 04/08/2007 Document Revised: 02/18/2012 Document Reviewed: 06/09/2007 [facility] Patient Information 2014 [employer], [place].   ________________________________________________________________________  =]

## 2019-04-23 ENCOUNTER — Telehealth: Payer: Self-pay

## 2019-04-23 NOTE — Telephone Encounter (Signed)
Amber aware of Dr Kennon Holter response .Adonis Housekeeper

## 2019-04-23 NOTE — Telephone Encounter (Signed)
   [facility] Pre-operative Risk Assessment    Request for surgical clearance:  1. What type of surgery is being performed? Total Knee replacement (clearance has already been provided)- They are asking if ok to use tourniquet on his right leg since he has femoral stents.   2. When is this surgery scheduled? 05/06/19   3. What type of clearance is required (medical clearance vs. Pharmacy clearance to hold med vs. Both)? Clearance provided on 12/17/18  4. Are there any medications that need to be held prior to surgery and how long?  Clearance provided on 12/17/18  5. Practice name and name of physician performing surgery? [facility] (Dr.Gioffre)  6. What is your office phone number [phone]   7.   What is your office fax number [phone]  8.   Anesthesia type (None, local, MAC, general) ?  Clearance provided on 12/17/18   Meryl Crutch 04/23/2019, 8:33 AM  _________________________________________________________________   (provider comments below)

## 2019-04-23 NOTE — Telephone Encounter (Signed)
Per Dr. Gwenlyn Found, the patient's stents are nitinol and cannot be crushed, therefore, no problem using tourniquets.

## 2019-04-24 ENCOUNTER — Encounter (HOSPITAL_COMMUNITY): Payer: Self-pay

## 2019-04-24 ENCOUNTER — Encounter (HOSPITAL_COMMUNITY)
Admission: RE | Admit: 2019-04-24 | Discharge: 2019-04-24 | Disposition: A | Discharge status: Home / Self Care | Payer: Medicare Other | Source: Ambulatory Visit | Attending: Orthopedic Surgery | Admitting: Orthopedic Surgery

## 2019-04-24 ENCOUNTER — Other Ambulatory Visit: Payer: Self-pay

## 2019-04-24 DIAGNOSIS — M1711 Unilateral primary osteoarthritis, right knee: Secondary | ICD-10-CM | POA: Insufficient documentation

## 2019-04-24 DIAGNOSIS — Z01812 Encounter for preprocedural laboratory examination: Secondary | ICD-10-CM | POA: Diagnosis not present

## 2019-04-24 HISTORY — DX: Deviated nasal septum: J34.2

## 2019-04-24 LAB — CBC WITH DIFFERENTIAL/PLATELET
Abs Immature Granulocytes: 0.03 10*3/uL (ref 0.00–0.07)
Basophils Absolute: 0 10*3/uL (ref 0.0–0.1)
Basophils Relative: 0 %
Eosinophils Absolute: 0 10*3/uL (ref 0.0–0.5)
Eosinophils Relative: 0 %
HCT: 37.5 % — ABNORMAL LOW (ref 39.0–52.0)
Hemoglobin: 12.2 g/dL — ABNORMAL LOW (ref 13.0–17.0)
Immature Granulocytes: 0 %
Lymphocytes Relative: 26 %
Lymphs Abs: 1.8 10*3/uL (ref 0.7–4.0)
MCH: 29.6 pg (ref 26.0–34.0)
MCHC: 32.5 g/dL (ref 30.0–36.0)
MCV: 91 fL (ref 80.0–100.0)
Monocytes Absolute: 0.8 10*3/uL (ref 0.1–1.0)
Monocytes Relative: 12 %
Neutro Abs: 4.3 10*3/uL (ref 1.7–7.7)
Neutrophils Relative %: 62 %
Platelets: 214 10*3/uL (ref 150–400)
RBC: 4.12 MIL/uL — ABNORMAL LOW (ref 4.22–5.81)
RDW: 12.3 % (ref 11.5–15.5)
WBC: 6.9 10*3/uL (ref 4.0–10.5)
nRBC: 0 % (ref 0.0–0.2)

## 2019-04-24 LAB — COMPREHENSIVE METABOLIC PANEL
ALT: 16 U/L (ref 0–44)
AST: 20 U/L (ref 15–41)
Albumin: 4.5 g/dL (ref 3.5–5.0)
Alkaline Phosphatase: 191 U/L — ABNORMAL HIGH (ref 38–126)
Anion gap: 8 (ref 5–15)
BUN: 26 mg/dL — ABNORMAL HIGH (ref 8–23)
CO2: 22 mmol/L (ref 22–32)
Calcium: 9.8 mg/dL (ref 8.9–10.3)
Chloride: 102 mmol/L (ref 98–111)
Creatinine, Ser: 1.57 mg/dL — ABNORMAL HIGH (ref 0.61–1.24)
GFR calc Af Amer: 50 mL/min — ABNORMAL LOW (ref >60)
GFR calc non Af Amer: 43 mL/min — ABNORMAL LOW (ref >60)
Glucose, Bld: 102 mg/dL — ABNORMAL HIGH (ref 70–99)
Potassium: 4.9 mmol/L (ref 3.5–5.1)
Sodium: 132 mmol/L — ABNORMAL LOW (ref 135–145)
Total Bilirubin: 0.8 mg/dL (ref 0.3–1.2)
Total Protein: 7.5 g/dL (ref 6.5–8.1)

## 2019-04-24 LAB — APTT: aPTT: 29 seconds (ref 24–36)

## 2019-04-24 LAB — SURGICAL PCR SCREEN
MRSA, PCR: NEGATIVE
Staphylococcus aureus: NEGATIVE

## 2019-04-24 LAB — PROTIME-INR
INR: 1 (ref 0.8–1.2)
Prothrombin Time: 13.3 seconds (ref 11.4–15.2)

## 2019-04-24 NOTE — Progress Notes (Signed)
PCP: Jani Gravel  CARDIOLOGIST: dr berry INFO IN Epic:cardiac clearance 12-17-2018 dr berry, ekg 12-17-2018, cardiac cath 03-13-18, peripheral vascular cath 05-12-18   INFO ON CHART:  BLOOD THINNERS AND LAST DOSES: stop plavix and aspirin 1 week prior per dr berry ____________________________________  PATIENT SYMPTOMS AT TIME OF PREOP:none

## 2019-04-27 NOTE — Progress Notes (Signed)
Anesthesia Chart Review   Case:  [id number] Date/Time:  05/06/19 0715   Procedure:  TOTAL KNEE ARTHROPLASTY (Right ) - 158min   Anesthesia type:  Choice   Pre-op diagnosis:  right knee osteoarthritis   Location:  [facility]   Surgeon:  Latanya Maudlin, MD      DISCUSSION:73 yo former smoker (65 pack years, quit 10/18/13) with h/o PVD (s/p stent to D SFA 2009, balloon angioplasty to left SFA 2018), renal insufficiency (creatinine 1.57-1.2 over the last year), GERD, CAD (DES RCA 03/13/18), HTN, hyperlipemia, right knee OA scheduled for above procedure 05/06/2019 with Dr. Latanya Maudlin.   Pt last seen by Cardiologist, Dr. [name], 12/17/2018.  Per his note, "Mr. Normoyle presents today for preoperative clearance before elective right and left total knee replacement by Dr. Gladstone Lighter.  He had RCA stenting by myself 03/13/2018 and is been asymptomatic since on aspirin and Plavix.  He had no other significant CAD.  He is cleared at low risk for his upcoming procedure.  He can stop his Plavix week prior to his orthopedic procedure beginning 1 April"  Pt can proceed with planned procedure barring acute status change.   VS: BP (!) 156/66   Pulse 73   Temp 36.5 C (Oral)   Resp 18   Ht $R'5\' 8"'Mz$  (1.727 m)   Wt 73.9 kg   SpO2 100%   BMI 24.77 kg/m   PROVIDERS: Jani Gravel, MD is PCP    LABS: Labs reviewed: Acceptable for surgery. (all labs ordered are listed, but only abnormal results are displayed)  Labs Reviewed  CBC WITH DIFFERENTIAL/PLATELET - Abnormal; Notable for the following components:      Result Value   RBC 4.12 (*)    Hemoglobin 12.2 (*)    HCT 37.5 (*)    All other components within normal limits  COMPREHENSIVE METABOLIC PANEL - Abnormal; Notable for the following components:   Sodium 132 (*)    Glucose, Bld 102 (*)    BUN 26 (*)    Creatinine, Ser 1.57 (*)    Alkaline Phosphatase 191 (*)    GFR calc non Af Amer 43 (*)    GFR calc Af Amer 50 (*)    All other components  within normal limits  SURGICAL PCR SCREEN  APTT  PROTIME-INR     IMAGES: Carotid Doppler 12/12/2018 Summary: Right Carotid: Non-hemodynamically significant plaque <50% noted in the CCA.                Patent ICA stent with no evidence of a restenosis.  Left Carotid: Velocities in the left ICA are consistent with a 1-39% stenosis.               Non-hemodynamically significant plaque noted in the CCA.  Vertebrals:  Bilateral vertebral arteries demonstrate antegrade flow. Subclavians: Right subclavian artery flow was disturbed.              Normal flow hemodynamics were seen in the left subclavian artery.  EKG: 12/17/2018 Rate 72 bpm Normal sinus rhythm  Nonspecific T wave abnormality   CV: Cardiac Cath 03/13/18  Ost RCA lesion is 90% stenosed.  Mid RCA lesion is 40% stenosed.  A stent was successfully placed.  Post intervention, there is a 10% residual stenosis.  Stress 12/14/2016  The left ventricular ejection fraction is mildly decreased (45-54%).  Nuclear stress EF: 49%.  The study is normal.  This is a low risk study.  There was no  ST segment deviation noted during stress.   Normal resting and stress perfusion. No ischemia or infarction EF 49% but visually looks normal Marked motion artifact on RAW images   Echo 06/06/2016 Study Conclusions  - Left ventricle: The cavity size was normal. Wall thickness was   increased in a pattern of mild LVH. Systolic function was normal.   The estimated ejection fraction was in the range of 55% to 60%.   Wall motion was normal; there were no regional wall motion   abnormalities. Doppler parameters are consistent with abnormal   left ventricular relaxation (grade 1 diastolic dysfunction). - Aortic valve: There was mild stenosis. Mean gradient (S): 12 mm   Hg. Peak gradient (S): 25 mm Hg. - Mitral valve: There was trivial regurgitation. - Left atrium: The atrium was mildly dilated. - Right ventricle: The cavity size was  normal. Systolic function   was normal. - Tricuspid valve: Peak RV-RA gradient (S): 27 mm Hg. - Pulmonary arteries: PA peak pressure: 30 mm Hg (S). - Inferior vena cava: The vessel was normal in size. The   respirophasic diameter changes were in the normal range (>= 50%),   consistent with normal central venous pressure.  Impressions:  - Normal LV size with mild LV hypertrophy. EF 55-60%. Normal RV   size and systolic function. Mild aortic stenosis. Past Medical History:  Diagnosis Date  . Arthritis    "LITTLE IN MY LEFT HAND" (05/12/2018)  . Bilateral calf pain 03-02-2013   lower ext.doppler-mild arterial insufficiency to lower ext. this is a disease in val  . CAD (coronary artery disease)    a. cath 02/24/18 - 90% ost RCA; 40% ost & pro LAD  . Cerebral atherosclerosis 03-02-2013   carotid duplex- normal study  . Deviated septum    RIGHT  . GERD (gastroesophageal reflux disease)   . History of echocardiogram 08-02-2005   normal study   . History of kidney stones   . Hyperlipemia   . Hypertension 03-30-2013   PV Angiogram- rt. renal artery stent was widely open,50-60% lt. renal artery stenosis,lt. SFA stents patent with high grade above the knee  poplital stenosis  . Hyponatremia 02/03/2017  . Normal cardiac stress test 03-25-2013   EF 60%, normal stress test ,this is a presurgical  test  . Peripheral vascular disease ([place])   . PVD (peripheral vascular disease) ([place])    A. 2009: S/P PTA/stent to D SFA. B. 01/2017: angiosculpt atherectomy/drug-eluting balloon angioplasty to L SFA.  [name] Renal artery stenosis ([place]) 03-02-2013   renal artery doppler-this was an abnormal doppler    Past Surgical History:  Procedure Laterality Date  . APPENDECTOMY  1960's  . CAROTID ENDARTERECTOMY Bilateral 2000's  . CORONARY ANGIOPLASTY WITH STENT PLACEMENT  03/13/2018  . CORONARY STENT INTERVENTION N/A 03/13/2018   Procedure: CORONARY STENT INTERVENTION;  Surgeon: Lorretta Harp, MD;  Location: [facility];  Service: Cardiovascular;  Laterality: N/A;  . CYSTOSCOPY W/ STONE MANIPULATION  "X 4 or 5"  . LEFT HEART CATH AND CORONARY ANGIOGRAPHY N/A 02/24/2018   Procedure: LEFT HEART CATH AND CORONARY ANGIOGRAPHY;  Surgeon: Lorretta Harp, MD;  Location: [facility];  Service: Cardiovascular;  Laterality: N/A;  . LITHOTRIPSY  "several"  . LOWER EXTREMITY ANGIOGRAM N/A 10/11/2014   Procedure: LOWER EXTREMITY ANGIOGRAM;  Surgeon: Lorretta Harp, MD;  Location: [facility] CATH LAB;  Service: Cardiovascular;  Laterality: N/A;  . LOWER EXTREMITY ANGIOGRAPHY N/A 02/04/2017   Procedure: Lower Extremity Angiography;  Surgeon: Lorretta Harp, MD;  Location: [facility];  Service: Cardiovascular;  Laterality: N/A;  . LOWER EXTREMITY ANGIOGRAPHY N/A 05/12/2018   Procedure: LOWER EXTREMITY ANGIOGRAPHY;  Surgeon: Lorretta Harp, MD;  Location: [place] [facility];  Service: Cardiovascular;  Laterality: N/A;  . PERCUTANEOUS STENT INTERVENTION  10/11/2014   Procedure: PERCUTANEOUS STENT INTERVENTION;  Surgeon: Lorretta Harp, MD;  Location: [facility] CATH LAB;  Service: Cardiovascular;;  right sfax2  . PERIPHERAL VASCULAR ATHERECTOMY  05/12/2018   Procedure: PERIPHERAL VASCULAR ATHERECTOMY;  Surgeon: Lorretta Harp, MD;  Location: [facility];  Service: Cardiovascular;;  left SFA  . PERIPHERAL VASCULAR CATHETERIZATION N/A 01/03/2017   Procedure: Carotid PTA/Stent Intervention / Right;  Surgeon: Lorretta Harp, MD;  Location: [place] [facility];  Service: Cardiovascular;  Laterality: N/A;  . PERIPHERAL VASCULAR INTERVENTION Left 02/04/2017   Procedure: Peripheral Vascular Intervention;  Surgeon: Lorretta Harp, MD;  Location: [place] [facility];  Service: Cardiovascular;  Laterality: Left;  left SFA  . POPLITEAL ARTERY ANGIOPLASTY Left 03/30/2013  . TONSILLECTOMY  1960's    MEDICATIONS: . amLODipine (NORVASC) 10 MG tablet  . aspirin 81 MG tablet  . atorvastatin (LIPITOR) 40 MG tablet   . Biotin 5000 MCG CAPS  . clopidogrel (PLAVIX) 75 MG tablet  . ezetimibe (ZETIA) 10 MG tablet  . irbesartan (AVAPRO) 300 MG tablet  . nitroGLYCERIN (NITROSTAT) 0.4 MG SL tablet  . pantoprazole (PROTONIX) 40 MG tablet  . tamsulosin (FLOMAX) 0.4 MG CAPS capsule   No current facility-administered medications for this encounter.     Maia Plan [facility] Pre-Surgical Testing [phone] 04/27/19 4:24 PM

## 2019-04-27 NOTE — Anesthesia Preprocedure Evaluation (Addendum)
Anesthesia Evaluation  Patient identified by MRN, date of birth, ID band Patient awake    Reviewed: Allergy & Precautions, NPO status , Patient's Chart, lab work & pertinent test results  Airway Mallampati: II  TM Distance: >3 FB Neck ROM: Full    Dental no notable dental hx.    Pulmonary former smoker,    Pulmonary exam normal breath sounds clear to auscultation       Cardiovascular hypertension, + angina + CAD, + Cardiac Stents and + Peripheral Vascular Disease  Normal cardiovascular exam Rhythm:Regular Rate:Normal  Pre-op eval per cardiology Gwenlyn Found)   Neuro/Psych negative neurological ROS  negative psych ROS   GI/Hepatic Neg liver ROS, GERD  Medicated and Controlled,  Endo/Other  negative endocrine ROS  Renal/GU Renal InsufficiencyRenal disease     Musculoskeletal negative musculoskeletal ROS (+)   Abdominal   Peds  Hematology  (+) anemia , HLD   Anesthesia Other Findings right knee osteoarthritis  Reproductive/Obstetrics                           Anesthesia Physical Anesthesia Plan  ASA: III  Anesthesia Plan: Spinal and Regional   Post-op Pain Management:  Regional for Post-op pain   Induction:   PONV Risk Score and Plan: 1 and Ondansetron, Dexamethasone, Treatment may vary due to age or medical condition and Propofol infusion  Airway Management Planned: Simple Face Mask  Additional Equipment:   Intra-op Plan:   Post-operative Plan:   Informed Consent: I have reviewed the patients History and Physical, chart, labs and discussed the procedure including the risks, benefits and alternatives for the proposed anesthesia with the patient or authorized representative who has indicated his/her understanding and acceptance.     Dental advisory given  Plan Discussed with: CRNA and Surgeon  Anesthesia Plan Comments: (Reviewed PAT note 04/24/19, Konrad Felix, PA-C)      Anesthesia Quick Evaluation

## 2019-05-01 ENCOUNTER — Other Ambulatory Visit (HOSPITAL_COMMUNITY)
Admission: RE | Admit: 2019-05-01 | Discharge: 2019-05-01 | Disposition: A | Discharge status: Home / Self Care | Payer: Medicare Other | Source: Ambulatory Visit | Attending: Orthopedic Surgery | Admitting: Orthopedic Surgery

## 2019-05-01 DIAGNOSIS — Z1159 Encounter for screening for other viral diseases: Secondary | ICD-10-CM | POA: Diagnosis not present

## 2019-05-02 LAB — NOVEL CORONAVIRUS, NAA (HOSP ORDER, SEND-OUT TO REF LAB; TAT 18-24 HRS): SARS-CoV-2, NAA: NOT DETECTED

## 2019-05-05 MED ORDER — BUPIVACAINE LIPOSOME 1.3 % IJ SUSP
20.0000 mL | Freq: Once | INTRAMUSCULAR | Status: DC
  Filled 2019-05-05: qty 20

## 2019-05-05 MED ORDER — TRANEXAMIC ACID 1000 MG/10ML IV SOLN
2000.0000 mg | INTRAVENOUS | Status: DC
  Filled 2019-05-05: qty 20

## 2019-05-05 NOTE — Progress Notes (Signed)
Called to do covid screening, VM left

## 2019-05-05 NOTE — H&P (Signed)
TOTAL KNEE ADMISSION H&P  Patient is being admitted for right total knee arthroplasty.  Subjective:  Chief Complaint:right knee pain.  HPI: Joshua Wyatt, 73 y.o. male, has a history of pain and functional disability in the right knee due to arthritis and has failed non-surgical conservative treatments for greater than 12 weeks to includeNSAID's and/or analgesics, corticosteriod injections, flexibility and strengthening excercises and activity modification.  Onset of symptoms was gradual, starting >10 years ago with gradually worsening course since that time. The patient noted no past surgery on the right knee(s).  Patient currently rates pain in the right knee(s) at 8 out of 10 with activity. Patient has night pain, worsening of pain with activity and weight bearing, pain that interferes with activities of daily living, pain with passive range of motion, crepitus and joint swelling.  Patient has evidence of subchondral cysts, periarticular osteophytes and joint space narrowing by imaging studies. There is no active infection.  Patient Active Problem List   Diagnosis Date Noted  . Preoperative clearance 12/17/2018  . Status post coronary artery stent placement   . CAD (coronary artery disease) 03/13/2018  . 2nd degree AV block 02/25/2018  . Status post left heart catheterization 02/24/2018  . Unstable angina ([place]) 02/21/2018  . PAD (peripheral artery disease) ([place]) 02/03/2017  . Hyponatremia 02/03/2017  . CKD (chronic kidney disease) stage 3, GFR 30-59 ml/min (HCC) 02/03/2017  . Hypokalemia 01/04/2017  . Bilateral carotid artery disease ([place]) 06/17/2015  . Uncontrolled hypertension 04/27/2013  . PVD (peripheral vascular disease) ([place]) 03/31/2013  . Severe claudication ([place]) 03/31/2013  . Mixed hyperlipidemia 03/31/2013   Past Medical History:  Diagnosis Date  . Arthritis    "LITTLE IN MY LEFT HAND" (05/12/2018)  . Bilateral calf pain 03-02-2013   lower ext.doppler-mild arterial  insufficiency to lower ext. this is a disease in val  . CAD (coronary artery disease)    a. cath 02/24/18 - 90% ost RCA; 40% ost & pro LAD  . Cerebral atherosclerosis 03-02-2013   carotid duplex- normal study  . Deviated septum    RIGHT  . GERD (gastroesophageal reflux disease)   . History of echocardiogram 08-02-2005   normal study   . History of kidney stones   . Hyperlipemia   . Hypertension 03-30-2013   PV Angiogram- rt. renal artery stent was widely open,50-60% lt. renal artery stenosis,lt. SFA stents patent with high grade above the knee  poplital stenosis  . Hyponatremia 02/03/2017  . Normal cardiac stress test 03-25-2013   EF 60%, normal stress test ,this is a presurgical  test  . Peripheral vascular disease ([place])   . PVD (peripheral vascular disease) ([place])    A. 2009: S/P PTA/stent to D SFA. B. 01/2017: angiosculpt atherectomy/drug-eluting balloon angioplasty to L SFA.  [name] Renal artery stenosis ([place]) 03-02-2013   renal artery doppler-this was an abnormal doppler    Past Surgical History:  Procedure Laterality Date  . APPENDECTOMY  1960's  . CAROTID ENDARTERECTOMY Bilateral 2000's  . CORONARY ANGIOPLASTY WITH STENT PLACEMENT  03/13/2018  . CORONARY STENT INTERVENTION N/A 03/13/2018   Procedure: CORONARY STENT INTERVENTION;  Surgeon: Lorretta Harp, MD;  Location: [place] [facility];  Service: Cardiovascular;  Laterality: N/A;  . CYSTOSCOPY W/ STONE MANIPULATION  "X 4 or 5"  . LEFT HEART CATH AND CORONARY ANGIOGRAPHY N/A 02/24/2018   Procedure: LEFT HEART CATH AND CORONARY ANGIOGRAPHY;  Surgeon: Lorretta Harp, MD;  Location: [place] [facility];  Service: Cardiovascular;  Laterality: N/A;  .  LITHOTRIPSY  "several"  . LOWER EXTREMITY ANGIOGRAM N/A 10/11/2014   Procedure: LOWER EXTREMITY ANGIOGRAM;  Surgeon: Runell Gess, MD;  Location: [facility] CATH LAB;  Service: Cardiovascular;  Laterality: N/A;  . LOWER EXTREMITY ANGIOGRAPHY N/A 02/04/2017   Procedure: Lower Extremity Angiography;   Surgeon: Runell Gess, MD;  Location: [facility] INVASIVE CV LAB;  Service: Cardiovascular;  Laterality: N/A;  . LOWER EXTREMITY ANGIOGRAPHY N/A 05/12/2018   Procedure: LOWER EXTREMITY ANGIOGRAPHY;  Surgeon: Runell Gess, MD;  Location: [facility] INVASIVE CV LAB;  Service: Cardiovascular;  Laterality: N/A;  . PERCUTANEOUS STENT INTERVENTION  10/11/2014   Procedure: PERCUTANEOUS STENT INTERVENTION;  Surgeon: Runell Gess, MD;  Location: [facility] CATH LAB;  Service: Cardiovascular;;  right sfax2  . PERIPHERAL VASCULAR ATHERECTOMY  05/12/2018   Procedure: PERIPHERAL VASCULAR ATHERECTOMY;  Surgeon: Runell Gess, MD;  Location: [facility] INVASIVE CV LAB;  Service: Cardiovascular;;  left SFA  . PERIPHERAL VASCULAR CATHETERIZATION N/A 01/03/2017   Procedure: Carotid PTA/Stent Intervention / Right;  Surgeon: Runell Gess, MD;  Location: [facility] INVASIVE CV LAB;  Service: Cardiovascular;  Laterality: N/A;  . PERIPHERAL VASCULAR INTERVENTION Left 02/04/2017   Procedure: Peripheral Vascular Intervention;  Surgeon: Runell Gess, MD;  Location: [facility] INVASIVE CV LAB;  Service: Cardiovascular;  Laterality: Left;  left SFA  . POPLITEAL ARTERY ANGIOPLASTY Left 03/30/2013  . TONSILLECTOMY  1960's    Current Facility-Administered Medications  Medication Dose Route Frequency Provider Last Rate Last Dose  . [START ON 05/06/2019] bupivacaine liposome (EXPAREL) 1.3 % injection 266 mg  20 mL Other Once Ranee Gosselin, MD      . Melene Muller ON 05/06/2019] tranexamic acid (CYKLOKAPRON) 2,000 mg in sodium chloride 0.9 % 50 mL Topical Application  2,000 mg Topical To OR Ranee Gosselin, MD       Current Outpatient Medications  Medication Sig Dispense Refill Last Dose  . amLODipine (NORVASC) 10 MG tablet TAKE 1 TABLET BY MOUTH ONCE DAILY 90 tablet 2   . aspirin 81 MG tablet Take 81 mg by mouth daily.   Taking  . atorvastatin (LIPITOR) 40 MG tablet Take 40 mg by mouth every evening.    Taking  . Biotin 5000 MCG CAPS Take 5,000 mcg by mouth  daily.   Taking  . clopidogrel (PLAVIX) 75 MG tablet Take 1 tablet by mouth once daily (Patient taking differently: Take 75 mg by mouth daily. ) 90 tablet 3   . ezetimibe (ZETIA) 10 MG tablet TAKE 1 TABLET BY MOUTH ONCE DAILY 90 tablet 2   . irbesartan (AVAPRO) 300 MG tablet TAKE 1 TABLET BY MOUTH ONCE DAILY 30 tablet 9   . nitroGLYCERIN (NITROSTAT) 0.4 MG SL tablet Place 1 tablet (0.4 mg total) under the tongue every 5 (five) minutes as needed. (Patient taking differently: Place 0.4 mg under the tongue every 5 (five) minutes as needed for chest pain. ) 25 tablet 12 Taking  . pantoprazole (PROTONIX) 40 MG tablet Take 1 tablet (40 mg total) by mouth daily. 90 tablet 3   . tamsulosin (FLOMAX) 0.4 MG CAPS capsule Take 0.4 mg by mouth every other day. AT HS   Taking   Allergies  Allergen Reactions  . Statins Swelling and Other (See Comments)    Muscle pain, also    Social History   Tobacco Use  . Smoking status: Former Smoker    Packs/day: 1.00    Years: 58.00    Pack years: 58.00    Types: Cigars, Cigarettes    Last  attempt to quit: 10/18/2013    Years since quitting: 5.5  . Smokeless tobacco: Never Used  Substance Use Topics  . Alcohol use: Not Currently    Family History  Problem Relation Age of Onset  . Heart disease Father        CABG  . Healthy Sister   . Healthy Brother      Review of Systems  Constitutional: Negative.   HENT: Positive for hearing loss. Negative for congestion, ear discharge, ear pain, nosebleeds, sinus pain, sore throat and tinnitus.   Eyes: Negative.   Respiratory: Negative.  Negative for stridor.   Cardiovascular: Negative.   Gastrointestinal: Negative.   Genitourinary: Negative.   Musculoskeletal: Positive for back pain, joint pain and myalgias. Negative for falls and neck pain.  Skin: Negative.   Neurological: Negative.   Endo/Heme/Allergies: Negative.   Psychiatric/Behavioral: Positive for memory loss. Negative for depression, hallucinations,  substance abuse and suicidal ideas. The patient is not nervous/anxious and does not have insomnia.     Objective:  Physical Exam  Constitutional: He is oriented to person, place, and time. He appears well-developed and well-nourished. No distress.  HENT:  Head: Normocephalic and atraumatic.  Right Ear: External ear normal.  Left Ear: External ear normal.  Nose: Nose normal.  Mouth/Throat: Oropharynx is clear and moist.  Eyes: Conjunctivae and EOM are normal.  Neck: Normal range of motion. Neck supple.  Cardiovascular: Normal rate, regular rhythm and intact distal pulses.  Murmur heard.  Systolic murmur is present with a grade of 3/6. Respiratory: Effort normal and breath sounds normal. No respiratory distress. He has no wheezes.  GI: Soft. Bowel sounds are normal. He exhibits no distension. There is no abdominal tenderness.  Musculoskeletal:     Right hip: Normal.     Left hip: Normal.     Comments: He is tender along the medial joint lines of both knees. Moderate patellofemoral crepitus in both knees. ROM 5-120 in right knee, 5-115 in left knee. No effusion or instability but he has a moderate varus deformity in both knees. Normal painless motion of the hips  Neurological: He is alert and oriented to person, place, and time. He has normal strength. No sensory deficit.  Skin: No rash noted. He is not diaphoretic. No erythema.  Psychiatric: He has a normal mood and affect. His behavior is normal.    Ht: 5 ft 8 in  Wt: 155 lbs  BMI: 23.6  BP: 142/78  Pulse: 76 bpm   Imaging Review Plain radiographs demonstrate severe degenerative joint disease of the right knee(s). The overall alignment ismild varus. The bone quality appears to be good for age and reported activity level.   Assessment/Plan:  End stage primary osteoarthritis, right knee   The patient history, physical examination, clinical judgment of the provider and imaging studies are consistent with end stage degenerative  joint disease of the right knee(s) and total knee arthroplasty is deemed medically necessary. The treatment options including medical management, injection therapy arthroscopy and arthroplasty were discussed at length. The risks and benefits of total knee arthroplasty were presented and reviewed. The risks due to aseptic loosening, infection, stiffness, patella tracking problems, thromboembolic complications and other imponderables were discussed. The patient acknowledged the explanation, agreed to proceed with the plan and consent was signed. Patient is being admitted for inpatient treatment for surgery, pain control, PT, OT, prophylactic antibiotics, VTE prophylaxis, progressive ambulation and ADL's and discharge planning. The patient is planning to be discharged home with home health  services    Anticipated LOS equal to or greater than 2 midnights due to - Age 65 and older with one or more of the following:  - Obesity  - Expected need for hospital services (PT, OT, Nursing) required for safe  discharge  - Anticipated need for postoperative skilled nursing care or inpatient rehab  - Active co-morbidities: Coronary Artery Disease OR   - Unanticipated findings during/Post Surgery: None  - Patient is a high risk of re-admission due to: None     Therapy Plans: HHPT then outpatient therapy Disposition: Home with wife Planned DVT prophylaxis: resume Plavix DME needed: need walker PCP: Dr. Maudie Mercury Cardio: Dr. Gwenlyn Found Other: no anesthesia concerns  Patient instructed on medications to stop prior to surgery.   BILATERAL FEMORAL STENTS- cleared for tourniquet  Ardeen Jourdain, PA-C

## 2019-05-05 NOTE — Progress Notes (Signed)
SPOKE W/  _Patient     SCREENING SYMPTOMS OF COVID 19:   COUGH--no  RUNNY NOSE--- no  SORE THROAT---no  NASAL CONGESTION----no  SNEEZING----no  SHORTNESS OF BREATH---no  DIFFICULTY BREATHING---no  TEMP >100.0 -----no  UNEXPLAINED BODY ACHES------no  CHILLS -------- no  HEADACHES ---------no  LOSS OF SMELL/ TASTE --------no    HAVE YOU OR ANY FAMILY MEMBER TRAVELLED PAST 14 DAYS OUT OF THE   COUNTY---no STATE----no COUNTRY----no  HAVE YOU OR ANY FAMILY MEMBER BEEN EXPOSED TO ANYONE WITH COVID 19? no

## 2019-05-06 ENCOUNTER — Ambulatory Visit (HOSPITAL_COMMUNITY): Payer: Medicare Other | Admitting: Anesthesiology

## 2019-05-06 ENCOUNTER — Ambulatory Visit (HOSPITAL_COMMUNITY): Payer: Medicare Other | Admitting: Physician Assistant

## 2019-05-06 ENCOUNTER — Other Ambulatory Visit: Payer: Self-pay

## 2019-05-06 ENCOUNTER — Encounter (HOSPITAL_COMMUNITY): Payer: Self-pay

## 2019-05-06 ENCOUNTER — Encounter (HOSPITAL_COMMUNITY)
Admission: RE | Disposition: A | Discharge status: Home / Self Care | Payer: Self-pay | Source: Home / Self Care | Attending: Orthopedic Surgery

## 2019-05-06 ENCOUNTER — Observation Stay (HOSPITAL_COMMUNITY)
Admission: RE | Admit: 2019-05-06 | Discharge: 2019-05-09 | Disposition: A | Discharge status: Home / Self Care | Payer: Medicare Other | Attending: Orthopedic Surgery | Admitting: Orthopedic Surgery

## 2019-05-06 DIAGNOSIS — Z87891 Personal history of nicotine dependence: Secondary | ICD-10-CM | POA: Insufficient documentation

## 2019-05-06 DIAGNOSIS — Z7902 Long term (current) use of antithrombotics/antiplatelets: Secondary | ICD-10-CM | POA: Diagnosis not present

## 2019-05-06 DIAGNOSIS — M24561 Contracture, right knee: Secondary | ICD-10-CM | POA: Insufficient documentation

## 2019-05-06 DIAGNOSIS — G8918 Other acute postprocedural pain: Secondary | ICD-10-CM | POA: Diagnosis not present

## 2019-05-06 DIAGNOSIS — I10 Essential (primary) hypertension: Secondary | ICD-10-CM | POA: Diagnosis present

## 2019-05-06 DIAGNOSIS — R079 Chest pain, unspecified: Secondary | ICD-10-CM | POA: Diagnosis present

## 2019-05-06 DIAGNOSIS — Z955 Presence of coronary angioplasty implant and graft: Secondary | ICD-10-CM | POA: Insufficient documentation

## 2019-05-06 DIAGNOSIS — N183 Chronic kidney disease, stage 3 (moderate): Secondary | ICD-10-CM | POA: Insufficient documentation

## 2019-05-06 DIAGNOSIS — I739 Peripheral vascular disease, unspecified: Secondary | ICD-10-CM | POA: Diagnosis present

## 2019-05-06 DIAGNOSIS — E782 Mixed hyperlipidemia: Secondary | ICD-10-CM | POA: Diagnosis not present

## 2019-05-06 DIAGNOSIS — K219 Gastro-esophageal reflux disease without esophagitis: Secondary | ICD-10-CM | POA: Diagnosis present

## 2019-05-06 DIAGNOSIS — Z888 Allergy status to other drugs, medicaments and biological substances status: Secondary | ICD-10-CM | POA: Insufficient documentation

## 2019-05-06 DIAGNOSIS — I2510 Atherosclerotic heart disease of native coronary artery without angina pectoris: Secondary | ICD-10-CM | POA: Diagnosis present

## 2019-05-06 DIAGNOSIS — I129 Hypertensive chronic kidney disease with stage 1 through stage 4 chronic kidney disease, or unspecified chronic kidney disease: Secondary | ICD-10-CM | POA: Insufficient documentation

## 2019-05-06 DIAGNOSIS — Z79899 Other long term (current) drug therapy: Secondary | ICD-10-CM | POA: Diagnosis not present

## 2019-05-06 DIAGNOSIS — I350 Nonrheumatic aortic (valve) stenosis: Secondary | ICD-10-CM | POA: Diagnosis not present

## 2019-05-06 DIAGNOSIS — M1711 Unilateral primary osteoarthritis, right knee: Secondary | ICD-10-CM | POA: Diagnosis not present

## 2019-05-06 DIAGNOSIS — Z87442 Personal history of urinary calculi: Secondary | ICD-10-CM | POA: Insufficient documentation

## 2019-05-06 DIAGNOSIS — Z9861 Coronary angioplasty status: Secondary | ICD-10-CM | POA: Diagnosis present

## 2019-05-06 DIAGNOSIS — Z95820 Peripheral vascular angioplasty status with implants and grafts: Secondary | ICD-10-CM | POA: Insufficient documentation

## 2019-05-06 DIAGNOSIS — Z96651 Presence of right artificial knee joint: Secondary | ICD-10-CM

## 2019-05-06 DIAGNOSIS — Z7982 Long term (current) use of aspirin: Secondary | ICD-10-CM | POA: Insufficient documentation

## 2019-05-06 HISTORY — PX: TOTAL KNEE ARTHROPLASTY: SHX125

## 2019-05-06 SURGERY — ARTHROPLASTY, KNEE, TOTAL
Anesthesia: Regional | Site: Knee | Laterality: Right

## 2019-05-06 MED ORDER — FENTANYL CITRATE (PF) 100 MCG/2ML IJ SOLN
INTRAMUSCULAR | Status: AC
  Filled 2019-05-06: qty 2

## 2019-05-06 MED ORDER — PROPOFOL 10 MG/ML IV BOLUS
INTRAVENOUS | Status: DC | PRN
  Administered 2019-05-06: 20 mg via INTRAVENOUS
  Administered 2019-05-06: 80 mg via INTRAVENOUS
  Administered 2019-05-06 (×3): 20 mg via INTRAVENOUS

## 2019-05-06 MED ORDER — METHOCARBAMOL 500 MG IVPB - SIMPLE MED
500.0000 mg | Freq: Four times a day (QID) | INTRAVENOUS | Status: DC | PRN
  Administered 2019-05-06: 10:00:00 500 mg via INTRAVENOUS
  Filled 2019-05-06: qty 50

## 2019-05-06 MED ORDER — OXYCODONE HCL 5 MG PO TABS: 10.0000 mg | ORAL_TABLET | ORAL | Status: DC | PRN

## 2019-05-06 MED ORDER — METOCLOPRAMIDE HCL 5 MG/ML IJ SOLN
5.0000 mg | Freq: Three times a day (TID) | INTRAMUSCULAR | Status: DC | PRN
  Administered 2019-05-06: 10 mg via INTRAVENOUS
  Filled 2019-05-06: qty 2

## 2019-05-06 MED ORDER — TAMSULOSIN HCL 0.4 MG PO CAPS
0.4000 mg | ORAL_CAPSULE | ORAL | Status: DC
  Administered 2019-05-07: 0.4 mg via ORAL
  Filled 2019-05-06: qty 1

## 2019-05-06 MED ORDER — POVIDONE-IODINE 10 % EX SWAB
2.0000 "application " | Freq: Once | CUTANEOUS | Status: AC
  Administered 2019-05-06: 2 via TOPICAL

## 2019-05-06 MED ORDER — CHLORHEXIDINE GLUCONATE 4 % EX LIQD: 60.0000 mL | Freq: Once | CUTANEOUS | Status: DC

## 2019-05-06 MED ORDER — METHOCARBAMOL 500 MG PO TABS: 500.0000 mg | ORAL_TABLET | Freq: Four times a day (QID) | ORAL | Status: DC | PRN

## 2019-05-06 MED ORDER — CEFAZOLIN SODIUM-DEXTROSE 2-4 GM/100ML-% IV SOLN
2.0000 g | INTRAVENOUS | Status: AC
  Administered 2019-05-06: 2 g via INTRAVENOUS
  Filled 2019-05-06: qty 100

## 2019-05-06 MED ORDER — FLEET ENEMA 7-19 GM/118ML RE ENEM: 1.0000 | ENEMA | Freq: Once | RECTAL | Status: DC | PRN

## 2019-05-06 MED ORDER — BUPIVACAINE IN DEXTROSE 0.75-8.25 % IT SOLN
INTRATHECAL | Status: DC | PRN
  Administered 2019-05-06: 1.8 mL via INTRATHECAL

## 2019-05-06 MED ORDER — NITROGLYCERIN 0.4 MG SL SUBL
0.4000 mg | SUBLINGUAL_TABLET | SUBLINGUAL | Status: DC | PRN
  Administered 2019-05-07 (×3): 0.4 mg via SUBLINGUAL
  Filled 2019-05-06 (×3): qty 1

## 2019-05-06 MED ORDER — DEXAMETHASONE SODIUM PHOSPHATE 10 MG/ML IJ SOLN
INTRAMUSCULAR | Status: AC
  Filled 2019-05-06: qty 1

## 2019-05-06 MED ORDER — BISACODYL 5 MG PO TBEC: 5.0000 mg | DELAYED_RELEASE_TABLET | Freq: Every day | ORAL | Status: DC | PRN

## 2019-05-06 MED ORDER — ACETAMINOPHEN 500 MG PO TABS
1000.0000 mg | ORAL_TABLET | Freq: Four times a day (QID) | ORAL | Status: AC
  Administered 2019-05-06 – 2019-05-07 (×4): 1000 mg via ORAL
  Filled 2019-05-06 (×4): qty 2

## 2019-05-06 MED ORDER — MENTHOL 3 MG MT LOZG
1.0000 | LOZENGE | OROMUCOSAL | Status: DC | PRN
  Filled 2019-05-06: qty 9

## 2019-05-06 MED ORDER — KETOROLAC TROMETHAMINE 30 MG/ML IJ SOLN
INTRAMUSCULAR | Status: AC
  Filled 2019-05-06: qty 1

## 2019-05-06 MED ORDER — ATORVASTATIN CALCIUM 40 MG PO TABS
40.0000 mg | ORAL_TABLET | Freq: Every evening | ORAL | Status: DC
  Administered 2019-05-06 – 2019-05-08 (×3): 40 mg via ORAL
  Filled 2019-05-06 (×3): qty 1

## 2019-05-06 MED ORDER — PROPOFOL 500 MG/50ML IV EMUL
INTRAVENOUS | Status: DC | PRN
  Administered 2019-05-06: 100 ug/kg/min via INTRAVENOUS

## 2019-05-06 MED ORDER — ONDANSETRON HCL 4 MG/2ML IJ SOLN: 4.0000 mg | Freq: Once | INTRAMUSCULAR | Status: DC | PRN

## 2019-05-06 MED ORDER — BIOTIN 5000 MCG PO CAPS: 5000.0000 ug | ORAL_CAPSULE | Freq: Every day | ORAL | Status: DC

## 2019-05-06 MED ORDER — STERILE WATER FOR IRRIGATION IR SOLN
Status: DC | PRN
  Administered 2019-05-06: 2000 mL

## 2019-05-06 MED ORDER — AMLODIPINE BESYLATE 10 MG PO TABS
10.0000 mg | ORAL_TABLET | Freq: Every day | ORAL | Status: DC
  Administered 2019-05-07 – 2019-05-09 (×3): 10 mg via ORAL
  Filled 2019-05-06 (×3): qty 1

## 2019-05-06 MED ORDER — KETAMINE HCL 10 MG/ML IJ SOLN
INTRAMUSCULAR | Status: DC | PRN
  Administered 2019-05-06 (×2): 10 mg via INTRAVENOUS

## 2019-05-06 MED ORDER — SODIUM CHLORIDE (PF) 0.9 % IJ SOLN
INTRAMUSCULAR | Status: AC
  Filled 2019-05-06: qty 50

## 2019-05-06 MED ORDER — ALUM & MAG HYDROXIDE-SIMETH 200-200-20 MG/5ML PO SUSP
30.0000 mL | ORAL | Status: DC | PRN
  Administered 2019-05-07: 30 mL via ORAL
  Filled 2019-05-06: qty 30

## 2019-05-06 MED ORDER — METOCLOPRAMIDE HCL 5 MG PO TABS: 5.0000 mg | ORAL_TABLET | Freq: Three times a day (TID) | ORAL | Status: DC | PRN

## 2019-05-06 MED ORDER — FENTANYL CITRATE (PF) 100 MCG/2ML IJ SOLN
INTRAMUSCULAR | Status: AC
  Filled 2019-05-06: qty 4

## 2019-05-06 MED ORDER — CEFAZOLIN SODIUM-DEXTROSE 1-4 GM/50ML-% IV SOLN
1.0000 g | Freq: Four times a day (QID) | INTRAVENOUS | Status: AC
  Administered 2019-05-06 (×2): 1 g via INTRAVENOUS
  Filled 2019-05-06 (×4): qty 50

## 2019-05-06 MED ORDER — HYDROMORPHONE HCL 1 MG/ML IJ SOLN
0.5000 mg | INTRAMUSCULAR | Status: DC | PRN
  Administered 2019-05-06 (×2): 0.5 mg via INTRAVENOUS
  Filled 2019-05-06: qty 1

## 2019-05-06 MED ORDER — SODIUM CHLORIDE 0.9 % IV SOLN
INTRAVENOUS | Status: AC
  Filled 2019-05-06: qty 500000

## 2019-05-06 MED ORDER — RIVAROXABAN 10 MG PO TABS
10.0000 mg | ORAL_TABLET | Freq: Every day | ORAL | Status: DC
  Administered 2019-05-07: 10 mg via ORAL
  Filled 2019-05-06 (×2): qty 1

## 2019-05-06 MED ORDER — ONDANSETRON HCL 4 MG/2ML IJ SOLN
INTRAMUSCULAR | Status: AC
  Filled 2019-05-06: qty 2

## 2019-05-06 MED ORDER — OXYCODONE HCL 5 MG PO TABS
5.0000 mg | ORAL_TABLET | ORAL | Status: DC | PRN
  Administered 2019-05-06 (×2): 5 mg via ORAL
  Administered 2019-05-07 (×2): 10 mg via ORAL
  Administered 2019-05-08: 5 mg via ORAL
  Filled 2019-05-06 (×2): qty 1
  Filled 2019-05-06: qty 2
  Filled 2019-05-06: qty 1
  Filled 2019-05-06: qty 2

## 2019-05-06 MED ORDER — POLYETHYLENE GLYCOL 3350 17 G PO PACK: 17.0000 g | PACK | Freq: Every day | ORAL | Status: DC | PRN

## 2019-05-06 MED ORDER — LACTATED RINGERS IV SOLN
INTRAVENOUS | Status: DC
  Administered 2019-05-06 – 2019-05-07 (×3): via INTRAVENOUS

## 2019-05-06 MED ORDER — DOCUSATE SODIUM 100 MG PO CAPS
100.0000 mg | ORAL_CAPSULE | Freq: Two times a day (BID) | ORAL | Status: DC
  Administered 2019-05-06 – 2019-05-09 (×6): 100 mg via ORAL
  Filled 2019-05-06 (×6): qty 1

## 2019-05-06 MED ORDER — FENTANYL CITRATE (PF) 100 MCG/2ML IJ SOLN
25.0000 ug | INTRAMUSCULAR | Status: DC | PRN
  Administered 2019-05-06 (×3): 50 ug via INTRAVENOUS

## 2019-05-06 MED ORDER — BUPIVACAINE LIPOSOME 1.3 % IJ SUSP
INTRAMUSCULAR | Status: DC | PRN
  Administered 2019-05-06: 20 mL

## 2019-05-06 MED ORDER — ONDANSETRON HCL 4 MG/2ML IJ SOLN
INTRAMUSCULAR | Status: DC | PRN
  Administered 2019-05-06: 4 mg via INTRAVENOUS

## 2019-05-06 MED ORDER — SODIUM CHLORIDE (PF) 0.9 % IJ SOLN
INTRAMUSCULAR | Status: DC | PRN
  Administered 2019-05-06: 20 mL

## 2019-05-06 MED ORDER — ONDANSETRON HCL 4 MG/2ML IJ SOLN
4.0000 mg | Freq: Four times a day (QID) | INTRAMUSCULAR | Status: DC | PRN
  Administered 2019-05-06 (×2): 4 mg via INTRAVENOUS
  Filled 2019-05-06 (×2): qty 2

## 2019-05-06 MED ORDER — HYDROMORPHONE HCL 1 MG/ML IJ SOLN
INTRAMUSCULAR | Status: AC
  Filled 2019-05-06: qty 1

## 2019-05-06 MED ORDER — SODIUM CHLORIDE 0.9 % IR SOLN
Status: DC | PRN
  Administered 2019-05-06: 1000 mL

## 2019-05-06 MED ORDER — EPHEDRINE 5 MG/ML INJ
INTRAVENOUS | Status: AC
  Filled 2019-05-06: qty 10

## 2019-05-06 MED ORDER — ONDANSETRON HCL 4 MG PO TABS: 4.0000 mg | ORAL_TABLET | Freq: Four times a day (QID) | ORAL | Status: DC | PRN

## 2019-05-06 MED ORDER — SODIUM CHLORIDE 0.9 % IV SOLN
INTRAVENOUS | Status: DC | PRN
  Administered 2019-05-06: 500 mL

## 2019-05-06 MED ORDER — FENTANYL CITRATE (PF) 100 MCG/2ML IJ SOLN
INTRAMUSCULAR | Status: DC | PRN
  Administered 2019-05-06 (×3): 25 ug via INTRAVENOUS
  Administered 2019-05-06: 50 ug via INTRAVENOUS

## 2019-05-06 MED ORDER — ACETAMINOPHEN 325 MG PO TABS
325.0000 mg | ORAL_TABLET | Freq: Four times a day (QID) | ORAL | Status: DC | PRN
  Administered 2019-05-08: 325 mg via ORAL
  Administered 2019-05-08 – 2019-05-09 (×2): 650 mg via ORAL
  Filled 2019-05-06 (×3): qty 2

## 2019-05-06 MED ORDER — FERROUS SULFATE 325 (65 FE) MG PO TABS
325.0000 mg | ORAL_TABLET | Freq: Three times a day (TID) | ORAL | Status: DC
  Administered 2019-05-07 – 2019-05-09 (×7): 325 mg via ORAL
  Filled 2019-05-06 (×8): qty 1

## 2019-05-06 MED ORDER — EZETIMIBE 10 MG PO TABS
10.0000 mg | ORAL_TABLET | Freq: Every day | ORAL | Status: DC
  Administered 2019-05-07 – 2019-05-09 (×3): 10 mg via ORAL
  Filled 2019-05-06 (×3): qty 1

## 2019-05-06 MED ORDER — DEXAMETHASONE SODIUM PHOSPHATE 10 MG/ML IJ SOLN
INTRAMUSCULAR | Status: DC | PRN
  Administered 2019-05-06: 8 mg via INTRAVENOUS

## 2019-05-06 MED ORDER — PHENOL 1.4 % MT LIQD
1.0000 | OROMUCOSAL | Status: DC | PRN
  Filled 2019-05-06: qty 177

## 2019-05-06 MED ORDER — LACTATED RINGERS IV SOLN
INTRAVENOUS | Status: DC
  Administered 2019-05-06: 06:00:00 via INTRAVENOUS

## 2019-05-06 MED ORDER — IRBESARTAN 300 MG PO TABS
300.0000 mg | ORAL_TABLET | Freq: Every day | ORAL | Status: DC
  Administered 2019-05-07 – 2019-05-09 (×3): 300 mg via ORAL
  Filled 2019-05-06 (×3): qty 1

## 2019-05-06 MED ORDER — KETAMINE HCL 10 MG/ML IJ SOLN
INTRAMUSCULAR | Status: AC
  Filled 2019-05-06: qty 1

## 2019-05-06 MED ORDER — LACTATED RINGERS IV SOLN
INTRAVENOUS | Status: DC
  Administered 2019-05-06 (×2): via INTRAVENOUS

## 2019-05-06 MED ORDER — PROPOFOL 10 MG/ML IV BOLUS
INTRAVENOUS | Status: AC
  Filled 2019-05-06: qty 60

## 2019-05-06 MED ORDER — ROPIVACAINE HCL 5 MG/ML IJ SOLN
INTRAMUSCULAR | Status: DC | PRN
  Administered 2019-05-06: 30 mL via PERINEURAL

## 2019-05-06 MED ORDER — METHOCARBAMOL 500 MG IVPB - SIMPLE MED
INTRAVENOUS | Status: AC
  Filled 2019-05-06: qty 50

## 2019-05-06 SURGICAL SUPPLY — 72 items
AGENT HMST SPONGE THK3/8 (HEMOSTASIS) ×1
BAG DECANTER FOR FLEXI CONT (MISCELLANEOUS) ×2 IMPLANT
BAG SPEC THK2 15X12 ZIP CLS (MISCELLANEOUS)
BAG ZIPLOCK 12X15 (MISCELLANEOUS) IMPLANT
BANDAGE ACE 4X5 VEL STRL LF (GAUZE/BANDAGES/DRESSINGS) ×3 IMPLANT
BANDAGE ACE 6X5 VEL STRL LF (GAUZE/BANDAGES/DRESSINGS) ×3 IMPLANT
BLADE SAG 18X100X1.27 (BLADE) ×2 IMPLANT
BLADE SAW SGTL 11.0X1.19X90.0M (BLADE) ×2 IMPLANT
BLADE SURG SZ10 CARB STEEL (BLADE) ×4 IMPLANT
BNDG GAUZE ELAST 4 BULKY (GAUZE/BANDAGES/DRESSINGS) ×4 IMPLANT
CEMENT HV SMART SET (Cement) ×4 IMPLANT
CEMENT TIBIA MBT (Knees) IMPLANT
COVER SURGICAL LIGHT HANDLE (MISCELLANEOUS) ×2 IMPLANT
COVER WAND RF STERILE (DRAPES) IMPLANT
CUFF TOURN SGL QUICK 34 (TOURNIQUET CUFF) ×2
CUFF TRNQT CYL 34X4.125X (TOURNIQUET CUFF) ×1 IMPLANT
DECANTER SPIKE VIAL GLASS SM (MISCELLANEOUS) ×2 IMPLANT
DRAPE INCISE IOBAN 66X45 STRL (DRAPES) IMPLANT
DRAPE U-SHAPE 47X51 STRL (DRAPES) ×2 IMPLANT
DRSG ADAPTIC 3X8 NADH LF (GAUZE/BANDAGES/DRESSINGS) ×2 IMPLANT
DRSG PAD ABDOMINAL 8X10 ST (GAUZE/BANDAGES/DRESSINGS) ×3 IMPLANT
DURAPREP 26ML APPLICATOR (WOUND CARE) ×2 IMPLANT
ELECT BLADE TIP CTD 4 INCH (ELECTRODE) ×3 IMPLANT
ELECT REM PT RETURN 15FT ADLT (MISCELLANEOUS) ×2 IMPLANT
EVACUATOR 1/8 PVC DRAIN (DRAIN) ×2 IMPLANT
FACESHIELD WRAPAROUND (MASK) ×2 IMPLANT
FACESHIELD WRAPAROUND OR TEAM (MASK) ×1 IMPLANT
FEMUR SIGMA PS SZ 3.0 R (Femur) ×1 IMPLANT
GAUZE SPONGE 4X4 12PLY STRL (GAUZE/BANDAGES/DRESSINGS) ×2 IMPLANT
GLOVE BIOGEL PI IND STRL 6.5 (GLOVE) ×1 IMPLANT
GLOVE BIOGEL PI IND STRL 8.5 (GLOVE) ×1 IMPLANT
GLOVE BIOGEL PI INDICATOR 6.5 (GLOVE) ×1
GLOVE BIOGEL PI INDICATOR 8.5 (GLOVE) ×1
GLOVE ECLIPSE 8.0 STRL XLNG CF (GLOVE) ×4 IMPLANT
GLOVE SURG SS PI 6.5 STRL IVOR (GLOVE) ×2 IMPLANT
GOWN STRL REUS W/TWL LRG LVL3 (GOWN DISPOSABLE) ×2 IMPLANT
GOWN STRL REUS W/TWL XL LVL3 (GOWN DISPOSABLE) ×2 IMPLANT
HANDPIECE INTERPULSE COAX TIP (DISPOSABLE) ×2
HEMOSTAT SPONGE AVITENE ULTRA (HEMOSTASIS) ×2 IMPLANT
HOLDER FOLEY CATH W/STRAP (MISCELLANEOUS) ×1 IMPLANT
IMMOBILIZER KNEE 20 (SOFTGOODS) ×4
IMMOBILIZER KNEE 20 THIGH 36 (SOFTGOODS) ×1 IMPLANT
INSERT PFC SIG STB SZ3 15.0MM (Knees) ×1 IMPLANT
KIT TURNOVER KIT A (KITS) IMPLANT
MANIFOLD NEPTUNE II (INSTRUMENTS) ×2 IMPLANT
NS IRRIG 1000ML POUR BTL (IV SOLUTION) IMPLANT
PACK TOTAL KNEE CUSTOM (KITS) ×2 IMPLANT
PADDING CAST COTTON 6X4 STRL (CAST SUPPLIES) ×2 IMPLANT
PATELLA DOME PFC 38MM (Knees) ×1 IMPLANT
PENCIL SMOKE EVAC W/HOLSTER (ELECTROSURGICAL) ×2 IMPLANT
PIN STEINMAN FIXATION KNEE (PIN) ×1 IMPLANT
PROTECTOR NERVE ULNAR (MISCELLANEOUS) ×2 IMPLANT
SET HNDPC FAN SPRY TIP SCT (DISPOSABLE) ×1 IMPLANT
SET PAD KNEE POSITIONER (MISCELLANEOUS) ×2 IMPLANT
SPONGE LAP 18X18 RF (DISPOSABLE) IMPLANT
SPONGE LAP 18X18 X RAY DECT (DISPOSABLE) ×1 IMPLANT
STRIP CLOSURE SKIN 1/2X4 (GAUZE/BANDAGES/DRESSINGS) ×3 IMPLANT
SUT BONE WAX W31G (SUTURE) IMPLANT
SUT MNCRL AB 4-0 PS2 18 (SUTURE) ×2 IMPLANT
SUT STRATAFIX 0 PDS 27 VIOLET (SUTURE) ×2
SUT VIC AB 1 CT1 27 (SUTURE) ×2
SUT VIC AB 1 CT1 27XBRD ANTBC (SUTURE) ×1 IMPLANT
SUT VIC AB 2-0 CT1 27 (SUTURE) ×6
SUT VIC AB 2-0 CT1 TAPERPNT 27 (SUTURE) ×3 IMPLANT
SUTURE STRATFX 0 PDS 27 VIOLET (SUTURE) ×1 IMPLANT
SYR 20CC LL (SYRINGE) ×4 IMPLANT
TIBIA MBT CEMENT (Knees) ×2 IMPLANT
TOWER CARTRIDGE SMART MIX (DISPOSABLE) ×2 IMPLANT
TRAY FOLEY MTR SLVR 16FR STAT (SET/KITS/TRAYS/PACK) ×2 IMPLANT
WATER STERILE IRR 1000ML POUR (IV SOLUTION) ×2 IMPLANT
WRAP KNEE MAXI GEL POST OP (GAUZE/BANDAGES/DRESSINGS) ×2 IMPLANT
YANKAUER SUCT BULB TIP 10FT TU (MISCELLANEOUS) ×2 IMPLANT

## 2019-05-06 NOTE — Op Note (Signed)
NAME: RAINN, BULLINGER MEDICAL RECORD VO:[id number] ACCOUNT [id number] DATE OF BIRTH:1946/02/19 FACILITY: [facility] LOCATION: [facility] PHYSICIAN:Lilie Vezina Fransico Setters, MD  OPERATIVE REPORT  DATE OF PROCEDURE:  05/06/2019  SURGEON:  Latanya Maudlin, MD  ASSISTANT:  Ardeen Jourdain, PA  PREOPERATIVE DIAGNOSES:   1.  Severe primary osteoarthritis with bone-on-bone and flexion contracture, right knee. 2.  He has a femoral stent in the right and left femoral and popliteal artery regions.  The little history before is we did call his cardiologist, and he agreed to let us use the tourniquet and this particular type of stent.  DESCRIPTION OF PROCEDURE:  All the precautions were taken.  I marked the appropriate right leg in the holding area.  Sterile prep and draping was carried out.  Time-out was carried out.  The leg was placed in the [facility] knee holder.  We then removed the  lag temporarily and exsanguinated the leg with an Esmarch, elevated the tourniquet to 300 mmHg.  At this particular time, the knee then was flexed.  He had 2 g of IV Ancef, 1 g of TXA, which will be given topical.  An incision was made over the anterior  aspect of the right knee with the knee flexed.  Bleeders identified and cauterized.  As we went along, we noted that we had some back venous bleeding, so I completely let the tourniquet down and did not use a tourniquet at all the rest of the way until  we decided whether or not we needed that when the cement was inserted.  We then did a median parapatellar incision.  I reflected the patella laterally, flexed the knee, did medial and lateral meniscectomies, and excised the anterior and posterior  cruciate ligaments.  I then excised the large spurs over the femur and tibial plateau region.  At this time, initial drill hole was made in the tibia in the femoral notch.  The guide rod was inserted up the canal.  We then inserted our first jig and  removed 12 mm thickness off the distal  femur.  We then measured the femur to be a size 3, right.  Attention then was directed to the tibia.  We removed the spinous processes with the oscillating saw.  I did a temporary measurement of the plateau.  It was  measured to be a size 3 tray.  Following that, the external guide was inserted and removed 4 mm thickness off the proximal tibia in the usual fashion.  We had a nice even cut.  Following that, I then inserted the lamina spreaders, removed the posterior  spurs, and completed the meniscectomies.  At this particular time, I then inserted my spacer blocks, and we elected to use a 15 block.  Note, he is a rather young type of individual, and I decided to go as thick as we could.  At this point, we then did  our notch cut out of the distal femur in the usual fashion.  Trial components were inserted.  I then did a resurfacing procedure on the patella.  We measured the patella to be a size 38.  The tibial tray was a size 3.  We then removed all components.   Prior to irrigating the canal, I then completed my keel cut of the tibial plateau.  Then, all trial components were inserted.  At this time, the tourniquet was still down.  We did not really need the tourniquet.  Following that, we thoroughly water  picked out the knee  and cemented all 3 components in simultaneously.  Note there was absolutely no need to reinsert the tourniquet because the bleeding was not there to hurt in regard to cementing process.  After this was done, all components were  cemented.  When the cement was hardened, we removed the loose pieces of cement and thoroughly water picked out the knee again, and then I inserted the permanent size 3 rotating platform 15 mm thickness, reduced the knee.  We had excellent stability  medially and laterally, good flexion and extension as well.  I then inserted a Hemovac drain, irrigated out the wound first with antibiotic solution.  We then closed the wound in layers in the usual fashion over the  Hemovac drain.  The patient will be  admitted to the hospital for observation at least overnight.  LN/NUANCE  D:05/06/2019 T:05/06/2019 JOB:[id number]

## 2019-05-06 NOTE — Discharge Instructions (Addendum)
Dr. Latanya Maudlin [facility] [address], [place], [place] [address] [phone]  TOTAL KNEE REPLACEMENT POSTOPERATIVE DIRECTIONS  Knee Rehabilitation, Guidelines Following Surgery  Results after knee surgery are often greatly improved when you follow the exercise, range of motion and muscle strengthening exercises prescribed by your doctor. Safety measures are also important to protect the knee from further injury. Any time any of these exercises cause you to have increased pain or swelling in your knee joint, decrease the amount until you are comfortable again and slowly increase them. If you have problems or questions, call your caregiver or physical therapist for advice.   HOME CARE INSTRUCTIONS  Remove items at home which could result in a fall. This includes throw rugs or furniture in walking pathways.   ICE to the affected knee every three hours for 30 minutes at a time and then as needed for pain and swelling.  Continue to use ice on the knee for pain and swelling from surgery. You may notice swelling that will progress down to the foot and ankle.  This is normal after surgery.  Elevate the leg when you are not up walking on it.    Continue to use the breathing machine which will help keep your temperature down.  It is common for your temperature to cycle up and down following surgery, especially at night when you are not up moving around and exerting yourself.  The breathing machine keeps your lungs expanded and your temperature down.  Do not place pillow under knee, focus on keeping the knee straight while resting  DIET You may resume your previous home diet once your are discharged from the hospital.  DRESSING / WOUND CARE / SHOWERING You may shower 3 days after surgery, but keep the wounds dry during showering.  You may use an occlusive plastic wrap (Press'n Seal for example), NO SOAKING/SUBMERGING IN THE BATHTUB.  If the bandage gets wet, change with a  clean dry gauze.  If the incision gets wet, pat the wound dry with a clean towel. You may start showering once you are discharged home but do not submerge the incision under water. Just pat the incision dry and apply a dry gauze dressing on daily. Change the surgical dressing daily and reapply a dry dressing each time.  ACTIVITY Walk with your walker as instructed. Use walker as long as suggested by your caregivers. Avoid periods of inactivity such as sitting longer than an hour when not asleep. This helps prevent blood clots.  You may resume a sexual relationship in one month or when given the OK by your doctor.  You may return to work once you are cleared by your doctor.  Do not drive a car for 6 weeks or until released by you surgeon.  Do not drive while taking narcotics.  WEIGHT BEARING Weight bearing as tolerated with assist device (walker, cane, etc) as directed, use it as long as suggested by your surgeon or therapist, typically at least 4-6 weeks.  POSTOPERATIVE CONSTIPATION PROTOCOL Constipation - defined medically as fewer than three stools per week and severe constipation as less than one stool per week.  One of the most common issues patients have following surgery is constipation.  Even if you have a regular bowel pattern at home, your normal regimen is likely to be disrupted due to multiple reasons following surgery.  Combination of anesthesia, postoperative narcotics, change in appetite and fluid intake all can affect your bowels.  In order to  avoid complications following surgery, here are some recommendations in order to help you during your recovery period.  Colace (docusate) - Pick up an over-the-counter form of Colace or another stool softener and take twice a day as long as you are requiring postoperative pain medications.  Take with a full glass of water daily.  If you experience loose stools or diarrhea, hold the colace until you stool forms back up.  If your symptoms do  not get better within 1 week or if they get worse, check with your doctor.  Dulcolax (bisacodyl) - Pick up over-the-counter and take as directed by the product packaging as needed to assist with the movement of your bowels.  Take with a full glass of water.  Use this product as needed if not relieved by Colace only.   MiraLax (polyethylene glycol) - Pick up over-the-counter to have on hand.  MiraLax is a solution that will increase the amount of water in your bowels to assist with bowel movements.  Take as directed and can mix with a glass of water, juice, soda, coffee, or tea.  Take if you go more than two days without a movement. Do not use MiraLax more than once per day. Call your doctor if you are still constipated or irregular after using this medication for 7 days in a row.  If you continue to have problems with postoperative constipation, please contact the office for further assistance and recommendations.  If you experience "the worst abdominal pain ever" or develop nausea or vomiting, please contact the office immediatly for further recommendations for treatment.  ITCHING  If you experience itching with your medications, try taking only a single pain pill, or even half a pain pill at a time.  You can also use Benadryl over the counter for itching or also to help with sleep.   TED HOSE STOCKINGS Wear the elastic stockings on both legs for three weeks following surgery during the day but you may remove then at night for sleeping.  MEDICATIONS See your medication summary on the After Visit Summary that the nursing staff will review with you prior to discharge.  You may have some home medications which will be placed on hold until you complete the course of blood thinner medication.  It is important for you to complete the blood thinner medication as prescribed by your surgeon.  Continue your approved medications as instructed at time of discharge.  PRECAUTIONS If you experience chest pain  or shortness of breath - call 911 immediately for transfer to the hospital emergency department.  If you develop a fever greater that 101 F, purulent drainage from wound, increased redness or drainage from wound, foul odor from the wound/dressing, or calf pain - CONTACT YOUR SURGEON.                                                   FOLLOW-UP APPOINTMENTS Make sure you keep all of your appointments after your operation with your surgeon and caregivers. You should call the office at the above phone number and make an appointment for approximately two weeks after the date of your surgery or on the date instructed by your surgeon outlined in the "After Visit Summary".   RANGE OF MOTION AND STRENGTHENING EXERCISES  Rehabilitation of the knee is important following a knee injury or an operation. After  just a few days of immobilization, the muscles of the thigh which control the knee become weakened and shrink (atrophy). Knee exercises are designed to build up the tone and strength of the thigh muscles and to improve knee motion. Often times heat used for twenty to thirty minutes before working out will loosen up your tissues and help with improving the range of motion but do not use heat for the first two weeks following surgery. These exercises can be done on a training (exercise) mat, on the floor, on a table or on a bed. Use what ever works the best and is most comfortable for you Knee exercises include:  Leg Lifts - While your knee is still immobilized in a splint or cast, you can do straight leg raises. Lift the leg to 60 degrees, hold for 3 sec, and slowly lower the leg. Repeat 10-20 times 2-3 times daily. Perform this exercise against resistance later as your knee gets better.  Quad and Hamstring Sets - Tighten up the muscle on the front of the thigh (Quad) and hold for 5-10 sec. Repeat this 10-20 times hourly. Hamstring sets are done by pushing the foot backward against an object and holding for 5-10  sec. Repeat as with quad sets.   Leg Slides: Lying on your back, slowly slide your foot toward your buttocks, bending your knee up off the floor (only go as far as is comfortable). Then slowly slide your foot back down until your leg is flat on the floor again.  [name] Wings: Lying on your back spread your legs to the side as far apart as you can without causing discomfort.  A rehabilitation program following serious knee injuries can speed recovery and prevent re-injury in the future due to weakened muscles. Contact your doctor or a physical therapist for more information on knee rehabilitation.   IF YOU ARE TRANSFERRED TO A SKILLED REHAB FACILITY If the patient is transferred to a skilled rehab facility following release from the hospital, a list of the current medications will be sent to the facility for the patient to continue.  When discharged from the skilled rehab facility, please have the facility set up the patient's [place] prior to being released. Also, the skilled facility will be responsible for providing the patient with their medications at time of release from the facility to include their pain medication, the muscle relaxants, and their blood thinner medication. If the patient is still at the rehab facility at time of the two week follow up appointment, the skilled rehab facility will also need to assist the patient in arranging follow up appointment in our office and any transportation needs.  MAKE SURE YOU:  Understand these instructions.  Get help right away if you are not doing well or get worse.    Pick up stool softner and laxative for home use following surgery while on pain medications. Do not submerge incision under water. Please use good hand washing techniques while changing dressing each day. May shower starting three days after surgery. Please use a clean towel to pat the incision dry following showers. Continue to use ice for pain and swelling  after surgery. Do not use any lotions or creams on the incision until instructed by your surgeon.  _________________________________________________________________________________________________  [facility] TEAM HAS ARRANGED FOR AN E-VISIT FOR YOUR APPOINTMENT - PLEASE REVIEW IMPORTANT INFORMATION BELOW SEVERAL DAYS PRIOR TO YOUR APPOINTMENT  Due to the recent COVID-19 pandemic, we are transitioning in-person office visits to  tele-medicine visits in an effort to decrease unnecessary exposure to our patients, their families, and staff. These visits are billed to your insurance just like a normal visit is. We also encourage you to sign up for MyChart if you have not already done so. You will need a smartphone if possible. For patients that do not have this, we can still complete the visit using a regular telephone but do prefer a smartphone to enable video when possible. You may have a family member that lives with you that can help. If possible, we also ask that you have a blood pressure cuff and scale at home to measure your blood pressure, heart rate and weight prior to your scheduled appointment. Patients with clinical needs that need an in-person evaluation and testing will still be able to come to the office if absolutely necessary. If you have any questions, feel free to call our office.     YOUR PROVIDER WILL BE USING ONE OF THE FOLLOWING PLATFORMS TO COMPLETE YOUR VISIT:    IF USING MYCHART - How to Download the MyChart App to Your SmartPhone   - If Apple, go to [employer] and type in MyChart in the search bar and download the app. If Android, ask patient to go to [employer] and type in [place] in the search bar and download the app. The app is free but as with any other app downloads, your phone may require you to verify saved payment information or Apple/Android password.  - You will need to then log into the app with your MyChart username and password, and select [facility]  as your healthcare provider to link the account.  - When it is time for your visit, go to the MyChart app, find appointments, and click Begin Video Visit. Be sure to Select Allow for your device to access the Microphone and Camera for your visit. You will then be connected, and your provider will be with you shortly.  **If you have any issues connecting or need assistance, please contact MyChart service desk [phone])**  **If using a computer, in order to ensure the best quality for your visit, you will need to use either of the following Internet Browsers: Insurance underwriter or Microsoft Edge**   IF USING DOXIMITY or [email or web address] - The staff will give you instructions on receiving your link to join the meeting the day of your visit.      2-3 DAYS BEFORE YOUR APPOINTMENT  You will receive a telephone call from one of our [facility] team members - your caller ID may say "Unknown caller." If this is a video visit, we will walk you through how to get the video launched on your phone. We will remind you check your blood pressure, heart rate and weight prior to your scheduled appointment. If you have an Apple Watch or Kardia, please upload any pertinent ECG strips the day before or morning of your appointment to [facility]. Our staff will also make sure you have reviewed the consent and agree to move forward with your scheduled tele-health visit.     THE DAY OF YOUR APPOINTMENT  Approximately 15 minutes prior to your scheduled appointment, you will receive a telephone call from one of [facility] team - your caller ID may say "Unknown caller."  Our staff will confirm medications, vital signs for the day and any symptoms you may be experiencing. Please have this information available prior to the time of visit start. It may also be helpful for  you to have a pad of paper and pen handy for any instructions given during your visit. They will also walk you through joining the smartphone meeting if this  is a video visit.    CONSENT FOR TELE-HEALTH VISIT - PLEASE REVIEW  I hereby voluntarily request, consent and authorize [facility] and its employed or contracted physicians, physician assistants, nurse practitioners or other licensed health care professionals (the Practitioner), to provide me with telemedicine health care services (the Services") as deemed necessary by the treating Practitioner. I acknowledge and consent to receive the Services by the Practitioner via telemedicine. I understand that the telemedicine visit will involve communicating with the Practitioner through live audiovisual communication technology and the disclosure of certain medical information by electronic transmission. I acknowledge that I have been given the opportunity to request an in-person assessment or other available alternative prior to the telemedicine visit and am voluntarily participating in the telemedicine visit.  I understand that I have the right to withhold or withdraw my consent to the use of telemedicine in the course of my care at any time, without affecting my right to future care or treatment, and that the Practitioner or I may terminate the telemedicine visit at any time. I understand that I have the right to inspect all information obtained and/or recorded in the course of the telemedicine visit and may receive copies of available information for a reasonable fee.  I understand that some of the potential risks of receiving the Services via telemedicine include:   Delay or interruption in medical evaluation due to technological equipment failure or disruption;  Information transmitted may not be sufficient (e.g. poor resolution of images) to allow for appropriate medical decision making by the Practitioner; and/or   In rare instances, security protocols could fail, causing a breach of personal health information.  Furthermore, I acknowledge that it is my responsibility to provide information about  my medical history, conditions and care that is complete and accurate to the best of my ability. I acknowledge that Practitioner's advice, recommendations, and/or decision may be based on factors not within their control, such as incomplete or inaccurate data provided by me or distortions of diagnostic images or specimens that may result from electronic transmissions. I understand that the practice of medicine is not an exact science and that Practitioner makes no warranties or guarantees regarding treatment outcomes. I acknowledge that I will receive a copy of this consent concurrently upon execution via email to the email address I last provided but may also request a printed copy by calling the office of [facility].    I understand that my insurance will be billed for this visit.   I have read or had this consent read to me.  I understand the contents of this consent, which adequately explains the benefits and risks of the Services being provided via telemedicine.   I have been provided ample opportunity to ask questions regarding this consent and the Services and have had my questions answered to my satisfaction.  I give my informed consent for the services to be provided through the use of telemedicine in my medical care  By participating in this telemedicine visit I agree to the above.  Iron tablets, capsules, extended-release tablets What is this medicine? IRON (AHY ern) replaces iron that is essential to healthy red blood cells. Iron is used to treat iron deficiency anemia. Anemia may cause problems like tiredness, shortness of breath, or slowed growth in children. Only take iron if your  doctor has told you to. Do not treat yourself with iron if you are feeling tired. Most healthy people get enough iron in their diets, particularly if they eat cereals, meat, poultry, and fish. This medicine may be used for other purposes; ask your health care provider or pharmacist if you have  questions. COMMON BRAND NAME(S): Cheri Kearns, [place], [place], Feosol Complete, [employer], [place], [place], [place], [place], [place], Barrister's clerk, Ferrocite, Hemocyte, Nephro-Fer, NovaFerrum, ProFe, Proferrin ES, Slow Fe, Slow Iron, Tandem What should I tell my health care provider before I take this medicine? They need to know if you have any of these conditions: -frequently drink alcohol -bowel disease -hemolytic anemia -iron overload (hemochromatosis, hemosiderosis) -liver disease -problems with swallowing -stomach ulcer or other stomach problems -an unusual or allergic reaction to iron, other medicines, foods, dyes, or preservatives -pregnant or trying to get pregnant -breast-feeding How should I use this medicine? Take this medicine by mouth with a glass of water or fruit juice. Follow the directions on the prescription label. Swallow whole. Do not crush or chew. Take this medicine in an upright or sitting position. Try to take any bedtime doses at least 10 minutes before lying down. You may take this medicine with food. Take your medicine at regular intervals. Do not take your medicine more often than directed. Do not stop taking except on your doctor's advice. Talk to your pediatrician regarding the use of this medicine in children. While this drug may be prescribed for selected conditions, precautions do apply. Overdosage: If you think you have taken too much of this medicine contact a poison control center or emergency room at once. NOTE: This medicine is only for you. Do not share this medicine with others. What if I miss a dose? If you miss a dose, take it as soon as you can. If it is almost time for your next dose, take only that dose. Do not take double or extra doses. What may interact with this medicine? If you are taking this iron product, you should not take iron in any other medicine or dietary supplement. This medicine may also interact with the following  medications: -alendronate -antacids -cefdinir -chloramphenicol -cholestyramine -deferoxamine -dimercaprol -etidronate -medicines for stomach ulcers or other stomach problems -pancreatic enzymes -quinolone antibiotics (examples: Cipro, Floxin, Levaquin, Tequin and others) -risedronate -tetracycline antibiotics (examples: doxycycline, tetracycline, minocycline, and others) -thyroid hormones This list may not describe all possible interactions. Give your health care provider a list of all the medicines, herbs, non-prescription drugs, or dietary supplements you use. Also tell them if you smoke, drink alcohol, or use illegal drugs. Some items may interact with your medicine. What should I watch for while using this medicine? Use iron supplements only as directed by your health care professional. Dennis Bast will need important blood work while you are taking this medicine. It may take 3 to 6 months of therapy to treat low iron levels. Pregnant women should follow the dose and length of iron treatment as directed by their doctors. Do not use iron longer than prescribed, and do not take a higher dose than recommended. Long-term use may cause excess iron to build-up in the body. Do not take iron with antacids. If you need to take an antacid, take it 2 hours after a dose of iron. What side effects may I notice from receiving this medicine? Side effects that you should report to your doctor or health care professional as soon as possible: -allergic reactions like skin rash, itching or hives, swelling  of the face, lips, or tongue -blue lips, nails, or palms -dark colored stools (this may be due to the iron, but can indicate a more serious condition) -drowsiness -pain with or difficulty swallowing -pale or clammy skin -seizures -stomach pain -unusually weak or tired -vomiting -weak, fast, or irregular heartbeat Side effects that usually do not require medical attention (report to your doctor or health  care professional if they continue or are bothersome): -constipation -indigestion -nausea or stomach upset This list may not describe all possible side effects. Call your doctor for medical advice about side effects. You may report side effects to FDA at 1-800-FDA-1088. Where should I keep my medicine? Keep out of the reach of children. Even small amounts of iron can be harmful to a child. Store at room temperature between 15 and 30 degrees C (59 and 86 degrees F). Keep container tightly closed. Throw away any unused medicine after the expiration date. NOTE: This sheet is a summary. It may not cover all possible information. If you have questions about this medicine, talk to your doctor, pharmacist, or health care provider.  2019 Elsevier/Gold Standard (2008-04-13 17:03:41)  Oxycodone tablets or capsules What is this medicine? OXYCODONE (ox i KOE done) is a pain reliever. It is used to treat moderate to severe pain. This medicine may be used for other purposes; ask your health care provider or pharmacist if you have questions. COMMON BRAND NAME(S): Dazidox, Endocodone, Oxaydo, OXECTA, OxyIR, Percolone, Roxicodone, Roxybond What should I tell my health care provider before I take this medicine? They need to know if you have any of these conditions: -[place]ison's disease -brain tumor -head injury -heart disease -history of drug or alcohol abuse problem -if you often drink alcohol -kidney disease -liver disease -lung or breathing disease, like asthma -mental illness -pancreatic disease -seizures -thyroid disease -an unusual or allergic reaction to oxycodone, codeine, hydrocodone, morphine, other medicines, foods, dyes, or preservatives -pregnant or trying to get pregnant -breast-feeding How should I use this medicine? Take this medicine by mouth with a glass of water. Follow the directions on the prescription label. You can take it with or without food. If it upsets your stomach, take  it with food. Take your medicine at regular intervals. Do not take it more often than directed. Do not stop taking except on your doctor's advice. Some brands of this medicine, like Oxecta, have special instructions. Ask your doctor or pharmacist if these directions are for you: Do not cut, crush or chew this medicine. Swallow only one tablet at a time. Do not wet, soak, or lick the tablet before you take it. A special MedGuide will be given to you by the pharmacist with each prescription and refill. Be sure to read this information carefully each time. Talk to your pediatrician regarding the use of this medicine in children. Special care may be needed. Overdosage: If you think you have taken too much of this medicine contact a poison control center or emergency room at once. NOTE: This medicine is only for you. Do not share this medicine with others. What if I miss a dose? If you miss a dose, take it as soon as you can. If it is almost time for your next dose, take only that dose. Do not take double or extra doses. What may interact with this medicine? This medicine may interact with the following medications: -alcohol -antihistamines for allergy, cough and cold -antiviral medicines for HIV or AIDS -atropine -certain antibiotics like clarithromycin, erythromycin, linezolid, rifampin -certain  medicines for anxiety or sleep -certain medicines for bladder problems like oxybutynin, tolterodine -certain medicines for depression like amitriptyline, fluoxetine, sertraline -certain medicines for fungal infections like ketoconazole, itraconazole, voriconazole -certain medicines for migraine headache like almotriptan, eletriptan, frovatriptan, naratriptan, rizatriptan, sumatriptan, zolmitriptan -certain medicines for nausea or vomiting like dolasetron, ondansetron, palonosetron -certain medicines for Parkinson's disease like benztropine, trihexyphenidyl -certain medicines for seizures like  phenobarbital, phenytoin, primidone -certain medicines for stomach problems like dicyclomine, hyoscyamine -certain medicines for travel sickness like scopolamine -diuretics -general anesthetics like halothane, isoflurane, methoxyflurane, propofol -ipratropium -local anesthetics like lidocaine, pramoxine, tetracaine -MAOIs like Carbex, Eldepryl, Marplan, Nardil, and Parnate -medicines that relax muscles for surgery -methylene blue -nilotinib -other narcotic medicines for pain or cough -phenothiazines like chlorpromazine, mesoridazine, prochlorperazine, thioridazine This list may not describe all possible interactions. Give your health care provider a list of all the medicines, herbs, non-prescription drugs, or dietary supplements you use. Also tell them if you smoke, drink alcohol, or use illegal drugs. Some items may interact with your medicine. What should I watch for while using this medicine? Tell your doctor or health care professional if your pain does not go away, if it gets worse, or if you have new or a different type of pain. You may develop tolerance to the medicine. Tolerance means that you will need a higher dose of the medicine for pain relief. Tolerance is normal and is expected if you take this medicine for a long time. Do not suddenly stop taking your medicine because you may develop a severe reaction. Your body becomes used to the medicine. This does NOT mean you are addicted. Addiction is a behavior related to getting and using a drug for a non-medical reason. If you have pain, you have a medical reason to take pain medicine. Your doctor will tell you how much medicine to take. If your doctor wants you to stop the medicine, the dose will be slowly lowered over time to avoid any side effects. There are different types of narcotic medicines (opiates). If you take more than one type at the same time or if you are taking another medicine that also causes drowsiness, you may have more  side effects. Give your health care provider a list of all medicines you use. Your doctor will tell you how much medicine to take. Do not take more medicine than directed. Call emergency for help if you have problems breathing or unusual sleepiness. You may get drowsy or dizzy. Do not drive, use machinery, or do anything that needs mental alertness until you know how the medicine affects you. Do not stand or sit up quickly, especially if you are an older patient. This reduces the risk of dizzy or fainting spells. Alcohol may interfere with the effect of this medicine. Avoid alcoholic drinks. This medicine will cause constipation. Try to have a bowel movement at least every 2 to 3 days. If you do not have a bowel movement for 3 days, call your doctor or health care professional. Your mouth may get dry. Chewing sugarless gum or sucking hard candy, and drinking plenty of water may help. Contact your doctor if the problem does not go away or is severe. What side effects may I notice from receiving this medicine? Side effects that you should report to your doctor or health care professional as soon as possible: -allergic reactions like skin rash, itching or hives, swelling of the face, lips, or tongue -breathing problems -confusion -signs and symptoms of low blood pressure like dizziness;  feeling faint or lightheaded, falls; unusually weak or tired -trouble passing urine or change in the amount of urine -trouble swallowing Side effects that usually do not require medical attention (report to your doctor or health care professional if they continue or are bothersome): -constipation -dry mouth -nausea, vomiting -tiredness This list may not describe all possible side effects. Call your doctor for medical advice about side effects. You may report side effects to FDA at 1-800-FDA-1088. Where should I keep my medicine? Keep out of the reach of children. This medicine can be abused. Keep your medicine in a  safe place to protect it from theft. Do not share this medicine with anyone. Selling or giving away this medicine is dangerous and against the law. Store at room temperature between 15 and 30 degrees C (59 and 86 degrees F). Protect from light. Keep container tightly closed. This medicine may cause harm and death if it is taken by other adults, children, or pets. Return medicine that has not been used to an official disposal site. Contact the DEA at (719)408-9729 or your city/county government to find a site. If you cannot return the medicine, flush it down the toilet. Do not use the medicine after the expiration date. NOTE: This sheet is a summary. It may not cover all possible information. If you have questions about this medicine, talk to your doctor, pharmacist, or health care provider.  2019 Elsevier/Gold Standard (2017-04-02 16:13:10)  Tizanidine tablets or capsules What is this medicine? TIZANIDINE (tye ZAN i deen) helps to relieve muscle spasms. It may be used to help in the treatment of multiple sclerosis and spinal cord injury. This medicine may be used for other purposes; ask your health care provider or pharmacist if you have questions. COMMON BRAND NAME(S): Zanaflex What should I tell my health care provider before I take this medicine? They need to know if you have any of these conditions: -kidney disease -liver disease -low blood pressure -mental disorder -an unusual or allergic reaction to tizanidine, other medicines, lactose (tablets only), foods, dyes, or preservatives -pregnant or trying to get pregnant -breast-feeding How should I use this medicine? Take this medicine by mouth with a full glass of water. Take this medicine on an empty stomach, at least 30 minutes before or 2 hours after food. Do not take with food unless you talk with your doctor. Follow the directions on the prescription label. Take your medicine at regular intervals. Do not take your medicine more often  than directed. Do not stop taking except on your doctor's advice. Suddenly stopping the medicine can be very dangerous. Talk to your pediatrician regarding the use of this medicine in children. Patients over 67 years old may have a stronger reaction and need a smaller dose. Overdosage: If you think you have taken too much of this medicine contact a poison control center or emergency room at once. NOTE: This medicine is only for you. Do not share this medicine with others. What if I miss a dose? If you miss a dose, take it as soon as you can. If it is almost time for your next dose, take only that dose. Do not take double or extra doses. What may interact with this medicine? Do not take this medicine with any of the following medications: -ciprofloxacin -fluvoxamine -narcotic medicines for cough -thiabendazole This medicine may also interact with the following medications: -acyclovir -alcohol -antihistamines for allergy, cough, and cold -baclofen -certain medicines for anxiety or sleep -certain medicines for blood pressure, heart  disease, irregular heartbeat -certain medicines for depression like amitriptyline, fluoxetine, sertraline -certain medicines for seizures like phenobarbital, primidone -certain medicines for stomach problems like cimetidine, famotidine -male hormones, like estrogens or progestins and birth control pills, patches, rings, or injections -general anesthetics like halothane, isoflurane, methoxyflurane, propofol -local anesthetics like lidocaine, pramoxine, tetracaine -medicines that relax muscles for surgery -narcotic medicines for pain -phenothiazines like chlorpromazine, mesoridazine, prochlorperazine -ticlopidine -zileuton This list may not describe all possible interactions. Give your health care provider a list of all the medicines, herbs, non-prescription drugs, or dietary supplements you use. Also tell them if you smoke, drink alcohol, or use illegal drugs.  Some items may interact with your medicine. What should I watch for while using this medicine? Tell your doctor or health care professional if your symptoms do not start to get better or if they get worse. You may get drowsy or dizzy. Do not drive, use machinery, or do anything that needs mental alertness until you know how this medicine affects you. Do not stand or sit up quickly, especially if you are an older patient. This reduces the risk of dizzy or fainting spells. Alcohol may interfere with the effect of this medicine. Avoid alcoholic drinks. If you are taking another medicine that also causes drowsiness, you may have more side effects. Give your health care provider a list of all medicines you use. Your doctor will tell you how much medicine to take. Do not take more medicine than directed. Call emergency for help if you have problems breathing or unusual sleepiness. Your mouth may get dry. Chewing sugarless gum or sucking hard candy, and drinking plenty of water may help. Contact your doctor if the problem does not go away or is severe. What side effects may I notice from receiving this medicine? Side effects that you should report to your doctor or health care professional as soon as possible: -allergic reactions like skin rash, itching or hives, swelling of the face, lips, or tongue -breathing problems -hallucinations -signs and symptoms of liver injury like dark yellow or brown urine; general ill feeling or flu-like symptoms; light-colored stools; loss of appetite; nausea; right upper quadrant belly pain; unusually weak or tired; yellowing of the eyes or skin -signs and symptoms of low blood pressure like dizziness; feeling faint or lightheaded, falls; unusually weak or tired -unusually slow heartbeat -unusually weak or tired Side effects that usually do not require medical attention (report to your doctor or health care professional if they continue or are bothersome): -blurred  vision -constipation -dizziness -dry mouth -tiredness This list may not describe all possible side effects. Call your doctor for medical advice about side effects. You may report side effects to FDA at 1-800-FDA-1088. Where should I keep my medicine? Keep out of the reach of children. Store at room temperature between 15 and 30 degrees C (59 and 86 degrees F). Throw away any unused medicine after the expiration date. NOTE: This sheet is a summary. It may not cover all possible information. If you have questions about this medicine, talk to your doctor, pharmacist, or health care provider.  2019 Elsevier/Gold Standard (2017-09-10 13:33:29)

## 2019-05-06 NOTE — Transfer of Care (Signed)
Immediate Anesthesia Transfer of Care Note  Patient: Joshua Wyatt  Procedure(s) Performed: Procedure(s) with comments: TOTAL KNEE ARTHROPLASTY (Right) - 170min  Patient Location: PACU  Anesthesia Type:General  Level of Consciousness:  sedated, patient cooperative and responds to stimulation  Airway & Oxygen Therapy:Patient Spontanous Breathing and Patient connected to face mask oxgen  Post-op Assessment:  Report given to PACU RN and Post -op Vital signs reviewed and stable  Post vital signs:  Reviewed and stable  Last Vitals:  Vitals:   05/06/19 0540  BP: 126/65  Pulse: 72  Resp: 20  Temp: 37.1 C  SpO2: 198%    Complications: No apparent anesthesia complications

## 2019-05-06 NOTE — Anesthesia Procedure Notes (Signed)
Anesthesia Regional Block: Adductor canal block   Pre-Anesthetic Checklist: ,, timeout performed, Correct Patient, Correct Site, Correct Laterality, Correct Procedure,, site marked, risks and benefits discussed, Surgical consent,  Pre-op evaluation,  At surgeon's request and post-op pain management  Laterality: Right  Prep: chloraprep       Needles:  Injection technique: Single-shot  Needle Type: Echogenic Stimulator Needle     Needle Length: 9cm  Needle Gauge: 21     Additional Needles:   Procedures:,,,, ultrasound used (permanent image in chart),,,,  Narrative:  Start time: 05/06/2019 7:00 AM End time: 05/06/2019 7:15 AM Injection made incrementally with aspirations every 5 mL.  Performed by: Personally  Anesthesiologist: Murvin Natal, MD  Additional Notes: Functioning IV was confirmed and monitors were applied. A time-out was performed. Hand hygiene and sterile gloves were used. The thigh was placed in a frog-leg position and prepped in a sterile fashion. A 34mm 21ga Arrow echogenic stimulator needle was placed using ultrasound guidance.  Negative aspiration and negative test dose prior to incremental administration of local anesthetic. The patient tolerated the procedure well.

## 2019-05-06 NOTE — Interval H&P Note (Signed)
History and Physical Interval Note:  05/06/2019 7:16 AM  Joshua Wyatt  has presented today for surgery, with the diagnosis of right knee osteoarthritis.  The various methods of treatment have been discussed with the patient and family. After consideration of risks, benefits and other options for treatment, the patient has consented to  Procedure(s) with comments: TOTAL KNEE ARTHROPLASTY (Right) - 154min as a surgical intervention.  The patient's history has been reviewed, patient examined, no change in status, stable for surgery.  I have reviewed the patient's chart and labs.  Questions were answered to the patient's satisfaction.     Latanya Maudlin

## 2019-05-06 NOTE — Anesthesia Procedure Notes (Signed)
Spinal  Patient location during procedure: OR Start time: 05/06/2019 7:35 AM End time: 05/06/2019 7:45 AM Staffing Anesthesiologist: Murvin Natal, MD Performed: anesthesiologist  Preanesthetic Checklist Completed: patient identified, surgical consent, pre-op evaluation, timeout performed, IV checked, risks and benefits discussed and monitors and equipment checked Spinal Block Patient position: sitting Prep: DuraPrep Patient monitoring: cardiac monitor, continuous pulse ox and blood pressure Approach: midline Location: L4-5 Injection technique: single-shot Needle Needle type: Pencan  Needle gauge: 24 G Needle length: 9 cm Assessment Sensory level: T10 Additional Notes Functioning IV was confirmed and monitors were applied. Sterile prep and drape, including hand hygiene and sterile gloves were used. The patient was positioned and the spine was prepped. The skin was anesthetized with lidocaine.  Free flow of clear CSF was obtained prior to injecting local anesthetic into the CSF.  The spinal needle aspirated freely following injection.  The needle was carefully withdrawn.  The patient tolerated the procedure well.

## 2019-05-06 NOTE — Evaluation (Signed)
Physical Therapy Evaluation Patient Details Name: Joshua Wyatt MRN: [id number] DOB: 01-20-1946 Today's Date: 05/06/2019   History of Present Illness  73 yo male s/p R TKR on 05/06/19. PMH includes CAD s/p coronary artery stent placement, second degree AV block, PAD, CKD, bilateral carotid artery disease, HLD, LE stent placement.   Clinical Impression   Pt presents with R knee pain, LE weakness R>L, decreased R knee ROM, difficulty performing mobility tasks, decreased activity tolerance due to N/V and pain. Pt to benefit from acute PT to address deficits. Pt ambulated short room distance, limited by emesis and nausea, RN notified. Pt educated on ankle pumps (20/hour) to perform this afternoon/evening to increase circulation, to pt's tolerance and limited by pain. PT to progress mobility as tolerated, and will continue to follow acutely.        Follow Up Recommendations Follow surgeon's recommendation for DC plan and follow-up therapies;Supervision for mobility/OOB(HHPT, then OPPT)    Equipment Recommendations  Rolling walker with 5" wheels    Recommendations for Other Services       Precautions / Restrictions Precautions Precautions: Fall Required Braces or Orthoses: Knee Immobilizer - Right Knee Immobilizer - Right: Discontinue once straight leg raise with < 10 degree lag;Other (comment)(KI not ordered, pt unable to lift LE off bed without significant quad lag. KI used for stability in standing POD0.) Restrictions Weight Bearing Restrictions: No Other Position/Activity Restrictions: WBAT      Mobility  Bed Mobility Overal bed mobility: Needs Assistance Bed Mobility: Supine to Sit     Supine to sit: HOB elevated;Mod assist     General bed mobility comments: Mod assist for LE management, trunk elevation, and scooting to EOB with use of bed pad.  Transfers Overall transfer level: Needs assistance Equipment used: Rolling walker (2 wheeled) Transfers: Sit to/from Stand Sit  to Stand: Min assist;From elevated surface;+2 safety/equipment         General transfer comment: Min assist +2 for power up, steadying. Increased time to rise, verbal cuing for hand placement when rising.   Ambulation/Gait Ambulation/Gait assistance: Min assist;+2 safety/equipment(chair follow) Gait Distance (Feet): 8 Feet Assistive device: Rolling walker (2 wheeled) Gait Pattern/deviations: Step-to pattern;Decreased stride length;Trunk flexed;Antalgic Gait velocity: decr    General Gait Details: Min assist for steadying, guiding RW. Verbal cuing for sequencing, placement in RW. After 8 ft ambulation, pt began dry heaves, given emesis bag and recliner brought to pt so he could sit. Pt with one small bout of emesis, RN notified.  Stairs            Wheelchair Mobility    Modified Rankin (Stroke Patients Only)       Balance Overall balance assessment: Needs assistance Sitting-balance support: No upper extremity supported;Feet supported Sitting balance-Leahy Scale: Fair     Standing balance support: Bilateral upper extremity supported Standing balance-Leahy Scale: Poor                               Pertinent Vitals/Pain Pain Assessment: Faces Faces Pain Scale: Hurts little more Pain Location: R knee  Pain Descriptors / Indicators: Sore Pain Intervention(s): Limited activity within patient's tolerance;Monitored during session;Repositioned;Premedicated before session;Ice applied    Home Living Family/patient expects to be discharged to:: Private residence Living Arrangements: Spouse/significant other Available Help at Discharge: Family;Available 24 hours/day(wife lives with; pt states he helps her because she has a feeding tube. Pt states stepson will also assist pt as needed) Type of  Home: House Home Access: Stairs to enter Entrance Stairs-Rails: Left Entrance Stairs-Number of Steps: 2 Home Layout: One level Home Equipment: Shower seat;Cane - single  point      Prior Function Level of Independence: Independent               Hand Dominance   Dominant Hand: Right    Extremity/Trunk Assessment   Upper Extremity Assessment Upper Extremity Assessment: Overall WFL for tasks assessed    Lower Extremity Assessment Lower Extremity Assessment: Generalized weakness;RLE deficits/detail RLE Deficits / Details: suspected post-operative RLE weakness; able to perform weak quad set, ankle pumps, assisted heel slide to 30* limited by pain. Unable to perform SLR  RLE Sensation: WNL    Cervical / Trunk Assessment Cervical / Trunk Assessment: Normal  Communication   Communication: No difficulties  Cognition Arousal/Alertness: Awake/alert Behavior During Therapy: WFL for tasks assessed/performed Overall Cognitive Status: Within Functional Limits for tasks assessed                                        General Comments      Exercises     Assessment/Plan    PT Assessment Patient needs continued PT services  PT Problem List Decreased strength;Decreased mobility;Decreased safety awareness;Decreased range of motion;Decreased activity tolerance;Decreased balance;Decreased knowledge of use of DME;Pain       PT Treatment Interventions DME instruction;Functional mobility training;Balance training;Patient/family education;Gait training;Therapeutic activities;Stair training;Therapeutic exercise    PT Goals (Current goals can be found in the Care Plan section)  Acute Rehab PT Goals Patient Stated Goal: go home to wife PT Goal Formulation: With patient Time For Goal Achievement: 05/13/19 Potential to Achieve Goals: Good    Frequency 7X/week   Barriers to discharge        Co-evaluation               AM-PAC PT "6 Clicks" Mobility  Outcome Measure Help needed turning from your back to your side while in a flat bed without using bedrails?: A Little Help needed moving from lying on your back to sitting on the  side of a flat bed without using bedrails?: A Little Help needed moving to and from a bed to a chair (including a wheelchair)?: A Little Help needed standing up from a chair using your arms (e.g., wheelchair or bedside chair)?: A Little Help needed to walk in hospital room?: A Little Help needed climbing 3-5 steps with a railing? : A Lot 6 Click Score: 17    End of Session Equipment Utilized During Treatment: Gait belt;Right knee immobilizer(KI used due to R knee weakness post-surgery) Activity Tolerance: Patient limited by fatigue;Treatment limited secondary to medical complications (Comment)(emesis) Patient left: in chair;with chair alarm set;with call bell/phone within reach;with SCD's reapplied Nurse Communication: Mobility status;Other (comment)(pt with nausea/vomiting) PT Visit Diagnosis: Other abnormalities of gait and mobility (R26.89);Muscle weakness (generalized) (M62.81);Difficulty in walking, not elsewhere classified (R26.2)    Time: 9562-1308 PT Time Calculation (min) (ACUTE ONLY): 17 min   Charges:   PT Evaluation $PT Eval Low Complexity: 1 Low        Basya Casavant Conception Chancy, PT Acute Rehabilitation Services Pager [phone]  Office [phone]  Scorpio Fortin D Elonda Husky 05/06/2019, 5:29 PM

## 2019-05-06 NOTE — Anesthesia Procedure Notes (Addendum)
Procedure Name: LMA Insertion Date/Time: 05/06/2019 8:10 AM Performed by: Anne Fu, CRNA Pre-anesthesia Checklist: Patient identified, Emergency Drugs available, Suction available, Patient being monitored and Timeout performed Patient Re-evaluated:Patient Re-evaluated prior to induction Oxygen Delivery Method: Circle system utilized Preoxygenation: Pre-oxygenation with 100% oxygen Induction Type: IV induction Ventilation: Mask ventilation without difficulty LMA: LMA inserted LMA Size: 3.0 Number of attempts: 1 Placement Confirmation: positive ETCO2 and breath sounds checked- equal and bilateral Tube secured with: Tape

## 2019-05-06 NOTE — Brief Op Note (Signed)
05/06/2019  9:31 AM  PATIENT:  Joshua Wyatt  73 y.o. male  PRE-OPERATIVE DIAGNOSIS:  right kneePrimary osteoarthritis  With Flexion Contractures  POST-OPERATIVE DIAGNOSIS:  Same as Pre-Op  PROCEDURE:  Procedure(s) with comments: TOTAL KNEE ARTHROPLASTY (Right) - 158min  SURGEON:  Surgeon(s) and Role:    Latanya Maudlin, MD - Primary  PHYSICIAN ASSISTANT: Ardeen Jourdain PA  ASSISTANTS:Amber Reeds[place] PA  ANESTHESIA:   spinal and Adductor Block  EBL:  200 mL   BLOOD ADMINISTERED:none  DRAINS: (Right) Hemovact drain(s) in the Right Knee with  Suction Open   LOCAL MEDICATIONS USED:  OTHER 20cc of Exparel mixed with 20c of Normal Saline  SPECIMEN:  No Specimen  DISPOSITION OF SPECIMEN:  N/A  COUNTS:  YES  TOURNIQUET:   Total Tourniquet Time Documented: Thigh (Right) - 14 minutes Thigh (Right) - 3 minutes Total: Thigh (Right) - 17 minutes   DICTATION: .Other Dictation: Dictation Number [id number]  PLAN OF CARE: Admit to inpatient   PATIENT DISPOSITION:  Stable in OR   Delay start of Pharmacological VTE agent (>24hrs) due to surgical blood loss or risk of bleeding: yes

## 2019-05-06 NOTE — Care Management CC44 (Signed)
Condition Code 44 Documentation Completed  Patient Details  Name: Joshua Wyatt MRN: [id number] Date of Birth: 03-30-1946   Condition Code 44 given:  Yes Patient signature on Condition Code 44 notice:  Yes Documentation of 2 MD's agreement:  Yes Code 44 added to claim:  Yes    Leeroy Cha, RN 05/06/2019, 3:58 PM

## 2019-05-06 NOTE — Care Management Obs Status (Signed)
[place] NOTIFICATION   Patient Details  Name: Joshua Wyatt MRN: [id number] Date of Birth: [birth year]   Medicare Observation Status Notification Given:  Yes    Leeroy Cha, RN 05/06/2019, 3:58 PM

## 2019-05-06 NOTE — Progress Notes (Signed)
PHARMACIST - PHYSICIAN ORDER COMMUNICATION  CONCERNING: P&T Medication Policy on Herbal Medications  DESCRIPTION:  This patient's order for:  Biotin  has been noted.  This product(s) is classified as an "herbal" or natural product. Due to a lack of definitive safety studies or FDA approval, nonstandard manufacturing practices, plus the potential risk of unknown drug-drug interactions while on inpatient medications, the Pharmacy and Therapeutics Committee does not permit the use of "herbal" or natural products of this type within [facility].   ACTION TAKEN: The pharmacy department is unable to verify this order at this time and your patient has been informed of this safety policy. Please reevaluate patient's clinical condition at discharge and address if the herbal or natural product(s) should be resumed at that time.  Dia Sitter, PharmD, BCPS 05/06/2019 12:58 PM

## 2019-05-06 NOTE — Anesthesia Postprocedure Evaluation (Signed)
Anesthesia Post Note  Patient: CLIVE PARCEL  Procedure(s) Performed: TOTAL KNEE ARTHROPLASTY (Right Knee)     Patient location during evaluation: PACU Anesthesia Type: Regional and General Level of consciousness: awake and alert Pain management: pain level controlled Vital Signs Assessment: post-procedure vital signs reviewed and stable Respiratory status: spontaneous breathing, nonlabored ventilation, respiratory function stable and patient connected to nasal cannula oxygen Cardiovascular status: blood pressure returned to baseline and stable Postop Assessment: no apparent nausea or vomiting Anesthetic complications: no    Last Vitals:  Vitals:   05/06/19 1414 05/06/19 1512  BP: 116/74 104/73  Pulse: 86 80  Resp: 14   Temp: 36.4 C   SpO2: 99% 100%    Last Pain:  Vitals:   05/06/19 1418  TempSrc:   PainSc: 5                  Ryan P Ellender

## 2019-05-07 ENCOUNTER — Other Ambulatory Visit: Payer: Self-pay

## 2019-05-07 ENCOUNTER — Encounter (HOSPITAL_COMMUNITY): Payer: Self-pay | Admitting: Orthopedic Surgery

## 2019-05-07 ENCOUNTER — Observation Stay (HOSPITAL_COMMUNITY): Payer: Medicare Other

## 2019-05-07 ENCOUNTER — Observation Stay (HOSPITAL_BASED_OUTPATIENT_CLINIC_OR_DEPARTMENT_OTHER): Payer: Medicare Other

## 2019-05-07 DIAGNOSIS — Z96651 Presence of right artificial knee joint: Secondary | ICD-10-CM | POA: Diagnosis not present

## 2019-05-07 DIAGNOSIS — R1013 Epigastric pain: Secondary | ICD-10-CM

## 2019-05-07 DIAGNOSIS — N183 Chronic kidney disease, stage 3 (moderate): Secondary | ICD-10-CM | POA: Diagnosis not present

## 2019-05-07 DIAGNOSIS — I129 Hypertensive chronic kidney disease with stage 1 through stage 4 chronic kidney disease, or unspecified chronic kidney disease: Secondary | ICD-10-CM | POA: Diagnosis not present

## 2019-05-07 DIAGNOSIS — Z87891 Personal history of nicotine dependence: Secondary | ICD-10-CM | POA: Diagnosis not present

## 2019-05-07 DIAGNOSIS — M1711 Unilateral primary osteoarthritis, right knee: Secondary | ICD-10-CM | POA: Diagnosis not present

## 2019-05-07 DIAGNOSIS — I350 Nonrheumatic aortic (valve) stenosis: Secondary | ICD-10-CM | POA: Diagnosis not present

## 2019-05-07 DIAGNOSIS — Z87442 Personal history of urinary calculi: Secondary | ICD-10-CM | POA: Diagnosis not present

## 2019-05-07 DIAGNOSIS — R079 Chest pain, unspecified: Secondary | ICD-10-CM | POA: Diagnosis present

## 2019-05-07 DIAGNOSIS — I340 Nonrheumatic mitral (valve) insufficiency: Secondary | ICD-10-CM | POA: Diagnosis not present

## 2019-05-07 DIAGNOSIS — I10 Essential (primary) hypertension: Secondary | ICD-10-CM

## 2019-05-07 DIAGNOSIS — E782 Mixed hyperlipidemia: Secondary | ICD-10-CM | POA: Diagnosis not present

## 2019-05-07 DIAGNOSIS — Z7982 Long term (current) use of aspirin: Secondary | ICD-10-CM | POA: Diagnosis not present

## 2019-05-07 DIAGNOSIS — I739 Peripheral vascular disease, unspecified: Secondary | ICD-10-CM | POA: Diagnosis not present

## 2019-05-07 DIAGNOSIS — Z79899 Other long term (current) drug therapy: Secondary | ICD-10-CM | POA: Diagnosis not present

## 2019-05-07 DIAGNOSIS — M24561 Contracture, right knee: Secondary | ICD-10-CM | POA: Diagnosis not present

## 2019-05-07 DIAGNOSIS — I2510 Atherosclerotic heart disease of native coronary artery without angina pectoris: Secondary | ICD-10-CM | POA: Diagnosis not present

## 2019-05-07 DIAGNOSIS — K219 Gastro-esophageal reflux disease without esophagitis: Secondary | ICD-10-CM | POA: Diagnosis present

## 2019-05-07 DIAGNOSIS — Z95820 Peripheral vascular angioplasty status with implants and grafts: Secondary | ICD-10-CM | POA: Diagnosis not present

## 2019-05-07 DIAGNOSIS — Z955 Presence of coronary angioplasty implant and graft: Secondary | ICD-10-CM | POA: Diagnosis not present

## 2019-05-07 DIAGNOSIS — Z7902 Long term (current) use of antithrombotics/antiplatelets: Secondary | ICD-10-CM | POA: Diagnosis not present

## 2019-05-07 DIAGNOSIS — I25119 Atherosclerotic heart disease of native coronary artery with unspecified angina pectoris: Secondary | ICD-10-CM

## 2019-05-07 DIAGNOSIS — Z888 Allergy status to other drugs, medicaments and biological substances status: Secondary | ICD-10-CM | POA: Diagnosis not present

## 2019-05-07 LAB — CBC
HCT: 27.8 % — ABNORMAL LOW (ref 39.0–52.0)
Hemoglobin: 9 g/dL — ABNORMAL LOW (ref 13.0–17.0)
MCH: 29.8 pg (ref 26.0–34.0)
MCHC: 32.4 g/dL (ref 30.0–36.0)
MCV: 92.1 fL (ref 80.0–100.0)
Platelets: 167 10*3/uL (ref 150–400)
RBC: 3.02 MIL/uL — ABNORMAL LOW (ref 4.22–5.81)
RDW: 12.4 % (ref 11.5–15.5)
WBC: 14.6 10*3/uL — ABNORMAL HIGH (ref 4.0–10.5)
nRBC: 0 % (ref 0.0–0.2)

## 2019-05-07 LAB — LIPID PANEL
Cholesterol: 111 mg/dL (ref 0–200)
HDL: 37 mg/dL — ABNORMAL LOW (ref >40)
LDL Cholesterol: 62 mg/dL (ref 0–99)
Total CHOL/HDL Ratio: 3 RATIO
Triglycerides: 61 mg/dL (ref <150)
VLDL: 12 mg/dL (ref 0–40)

## 2019-05-07 LAB — BASIC METABOLIC PANEL
Anion gap: 11 (ref 5–15)
BUN: 18 mg/dL (ref 8–23)
CO2: 19 mmol/L — ABNORMAL LOW (ref 22–32)
Calcium: 9.2 mg/dL (ref 8.9–10.3)
Chloride: 102 mmol/L (ref 98–111)
Creatinine, Ser: 1.43 mg/dL — ABNORMAL HIGH (ref 0.61–1.24)
GFR calc Af Amer: 56 mL/min — ABNORMAL LOW (ref >60)
GFR calc non Af Amer: 48 mL/min — ABNORMAL LOW (ref >60)
Glucose, Bld: 140 mg/dL — ABNORMAL HIGH (ref 70–99)
Potassium: 4.6 mmol/L (ref 3.5–5.1)
Sodium: 132 mmol/L — ABNORMAL LOW (ref 135–145)

## 2019-05-07 LAB — ECHOCARDIOGRAM COMPLETE
Height: 68 in
Weight: 2606.4 oz

## 2019-05-07 LAB — TROPONIN I
Troponin I: <0.03 ng/mL (ref <0.03)
Troponin I: <0.03 ng/mL (ref <0.03)
Troponin I: 0.04 ng/mL (ref <0.03)
Troponin I: 0.04 ng/mL (ref <0.03)

## 2019-05-07 LAB — HEMOGLOBIN A1C
Hgb A1c MFr Bld: 6.2 % — ABNORMAL HIGH (ref 4.8–5.6)
Mean Plasma Glucose: 131.24 mg/dL

## 2019-05-07 IMAGING — DX PORTABLE CHEST - 1 VIEW
1 series · 1 of 1 positions shown · non-contrast
Comparison: [DATE].

CLINICAL DATA: Chest pain.

EXAM:
PORTABLE CHEST 1 VIEW

[chest ap]
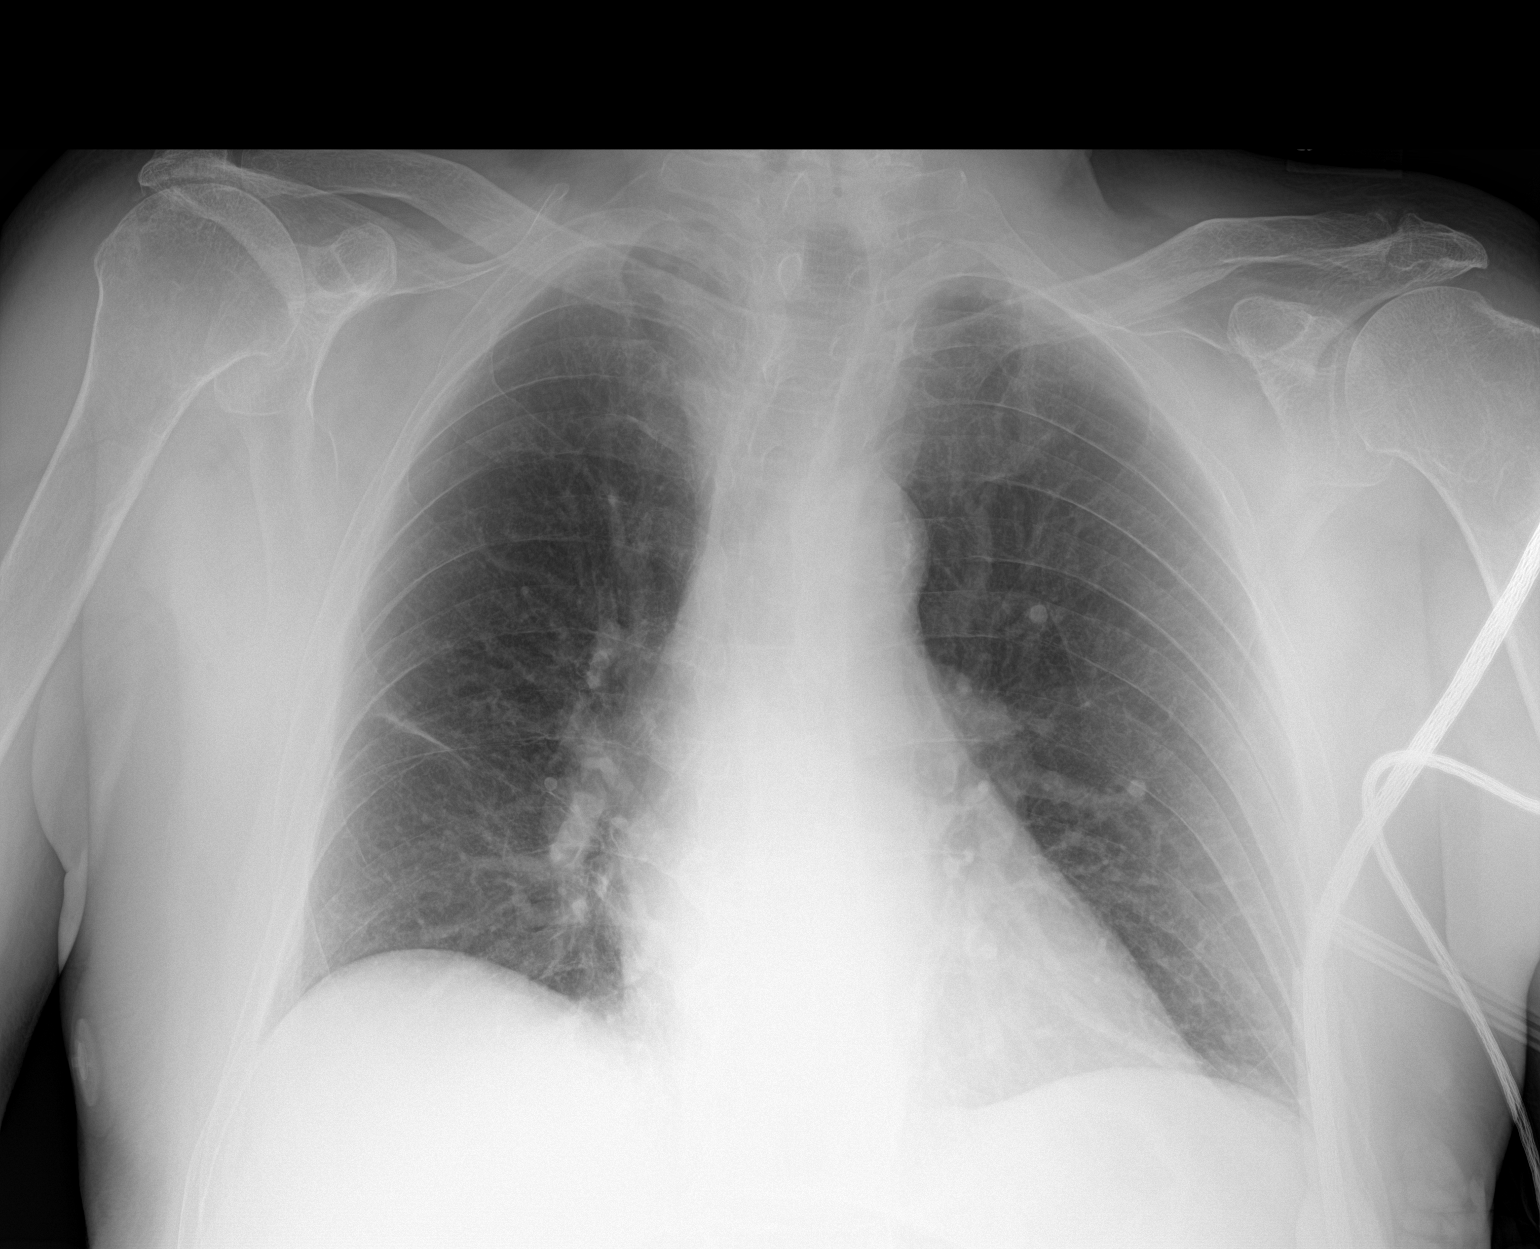

[1 of 1 positions shown; findings below may reference images not displayed]

FINDINGS: Mediastinum and hilar structures normal. Mild right mid lung field
and left base subsegmental atelectasis. No focal alveolar
infiltrate. No pleural effusion or pneumothorax. Heart size normal.
Thoracic spine scoliosis and degenerative change. Degenerative
changes both shoulders. Diffuse osteopenia. Surgical clips and a
stent noted over the right neck. Left carotid calcification noted.
IMPRESSION: 1. Mild right mid lung field and left base subsegmental atelectasis.
No acute infiltrates. Heart size normal.

2. Surgical clips and a vascular stent noted over the right neck.
Left carotid calcification consistent atherosclerotic vascular
disease.

## 2019-05-07 MED ORDER — ASPIRIN EC 81 MG PO TBEC
81.0000 mg | DELAYED_RELEASE_TABLET | Freq: Every day | ORAL | Status: DC
  Administered 2019-05-07 – 2019-05-09 (×3): 81 mg via ORAL
  Filled 2019-05-07 (×3): qty 1

## 2019-05-07 MED ORDER — PANTOPRAZOLE SODIUM 40 MG PO TBEC
40.0000 mg | DELAYED_RELEASE_TABLET | Freq: Every day | ORAL | Status: DC
  Administered 2019-05-07 – 2019-05-08 (×2): 40 mg via ORAL
  Filled 2019-05-07 (×2): qty 1

## 2019-05-07 MED ORDER — HYDRALAZINE HCL 20 MG/ML IJ SOLN: 5.0000 mg | INTRAMUSCULAR | Status: DC | PRN

## 2019-05-07 MED ORDER — HYDRALAZINE HCL 20 MG/ML IJ SOLN
10.0000 mg | INTRAMUSCULAR | Status: DC | PRN
  Administered 2019-05-07: 10 mg via INTRAVENOUS
  Filled 2019-05-07: qty 1

## 2019-05-07 NOTE — Progress Notes (Signed)
PROGRESS NOTE    Joshua Wyatt  IRJ:[id number] DOB: [birth year] DOA: 05/06/2019 PCP: Jani Gravel, MD   Brief Narrative:  Joshua Wyatt is a 73 year old pleasant gentleman with a history of hypertension, hyperlipidemia, GERD, CAD status post stent placement, PVD and CKD stage III who was admitted by orthopedics and underwent right knee replacement by Ortho.  He had chest pain last night so hospital service was consulted, EKG was done and troponins were ordered.  Consultants:   Hospitalist  Procedures:   Right knee arthroplasty  Antimicrobials:   None   Subjective: Patient seen and examined.  He states that he is feeling much better and does not have any chest pain.  He attributes to his chest pain is anxiety as he states that he has a lot of anxiety most of the time.  Objective: Vitals:   05/07/19 0455 05/07/19 0615 05/07/19 0641 05/07/19 0939  BP: (!) 155/75 (!) 179/95 (!) 153/72 140/67  Pulse: (!) 113 (!) 102 86 85  Resp: $Remo'18 19  20  'zYDkJ$ Temp: 98.2 F (36.8 C) 98 F (36.7 C)  (!) 97.5 F (36.4 C)  TempSrc: Oral Oral  Oral  SpO2: 95% 98%  100%  Weight:      Height:        Intake/Output Summary (Last 24 hours) at 05/07/2019 1250 Last data filed at 05/07/2019 1245 Gross per 24 hour  Intake 2193 ml  Output 1660 ml  Net 533 ml   Filed Weights   05/06/19 0526  Weight: 73.9 kg    Examination:  General exam: Appears calm and comfortable  Respiratory system: Clear to auscultation. Respiratory effort normal. Cardiovascular system: S1 & S2 heard, RRR. No JVD, murmurs, rubs, gallops or clicks. No pedal edema. Gastrointestinal system: Abdomen is nondistended, soft and nontender. No organomegaly or masses felt. Normal bowel sounds heard. Central nervous system: Alert and oriented. No focal neurological deficits. Extremities: Symmetric 5 x 5 power. Skin: No rashes, lesions or ulcers Psychiatry: Judgement and insight appear normal. Mood & affect appropriate.    Data  Reviewed: I have personally reviewed following labs and imaging studies  CBC: Recent Labs  Lab 05/07/19 0427  WBC 14.6*  HGB 9.0*  HCT 27.8*  MCV 92.1  PLT 301   Basic Metabolic Panel: Recent Labs  Lab 05/07/19 0427  NA 132*  K 4.6  CL 102  CO2 19*  GLUCOSE 140*  BUN 18  CREATININE 1.43*  CALCIUM 9.2   GFR: Estimated Creatinine Clearance: 43.8 mL/min (A) (by C-G formula based on SCr of 1.43 mg/dL (H)). Liver Function Tests: No results for input(s): AST, ALT, ALKPHOS, BILITOT, PROT, ALBUMIN in the last 168 hours. No results for input(s): LIPASE, AMYLASE in the last 168 hours. No results for input(s): AMMONIA in the last 168 hours. Coagulation Profile: No results for input(s): INR, PROTIME in the last 168 hours. Cardiac Enzymes: Recent Labs  Lab 05/07/19 0427 05/07/19 1020  TROPONINI <0.03 <0.03   BNP (last 3 results) No results for input(s): PROBNP in the last 8760 hours. HbA1C: Recent Labs    05/07/19 0450  HGBA1C 6.2*   CBG: No results for input(s): GLUCAP in the last 168 hours. Lipid Profile: Recent Labs    05/07/19 0450  CHOL 111  HDL 37*  LDLCALC 62  TRIG 61  CHOLHDL 3.0   Thyroid Function Tests: No results for input(s): TSH, T4TOTAL, FREET4, T3FREE, THYROIDAB in the last 72 hours. Anemia Panel: No results for input(s): VITAMINB12, FOLATE, FERRITIN, TIBC,  IRON, RETICCTPCT in the last 72 hours. Sepsis Labs: No results for input(s): PROCALCITON, LATICACIDVEN in the last 168 hours.  Recent Results (from the past 240 hour(s))  Novel Coronavirus, NAA (hospital order; send-out to ref lab)     Status: None   Collection Time: 05/01/19 10:56 AM  Result Value Ref Range Status   SARS-CoV-2, NAA NOT DETECTED NOT DETECTED Final    Comment: (NOTE) Testing was performed using the cobas(R) SARS-CoV-2 test. This test was developed and its performance characteristics determined by [employer]. This test has not been FDA cleared or approved. This  test has been authorized by FDA under an Emergency Use Authorization (EUA). This test is only authorized for the duration of time the declaration that circumstances exist justifying the authorization of the emergency use of in vitro diagnostic tests for detection of SARS-CoV-2 virus and/or diagnosis of COVID-19 infection under section 564(b)(1) of the Act, 21 U.S.C. 568LEX-5(T)(7), unless the authorization is terminated or revoked sooner. When diagnostic testing is negative, the possibility of a false negative result should be considered in the context of a patient's recent exposures and the presence of clinical signs and symptoms consistent with COVID-19. An individual without symptoms of COVID-19 and who is not shedding SARS-CoV-2 virus would expect to have  a negative (not detected) result in this assay. Performed At: [facility] [address] [place], [place] [id number] Rush Farmer MD QP:[id number]    [place]  Final    Comment: Performed at [facility], [place] [address] [place], [place] [address]      Radiology Studies: Dg Chest Port 1 View  Result Date: 05/07/2019 CLINICAL DATA:  Chest pain. EXAM: PORTABLE CHEST 1 VIEW COMPARISON:  01/24/2017. FINDINGS: Mediastinum and hilar structures normal. Mild right mid lung field and left base subsegmental atelectasis. No focal alveolar infiltrate. No pleural effusion or pneumothorax. Heart size normal. Thoracic spine scoliosis and degenerative change. Degenerative changes both shoulders. Diffuse osteopenia. Surgical clips and a stent noted over the right neck. Left carotid calcification noted. IMPRESSION: 1. Mild right mid lung field and left base subsegmental atelectasis. No acute infiltrates. Heart size normal. 2. Surgical clips and a vascular stent noted over the right neck. Left carotid calcification consistent atherosclerotic vascular disease. Electronically Signed   By: [place]   On:  05/07/2019 06:12    Scheduled Meds: . amLODipine  10 mg Oral Daily  . aspirin EC  81 mg Oral Daily  . atorvastatin  40 mg Oral QPM  . docusate sodium  100 mg Oral BID  . ezetimibe  10 mg Oral Daily  . ferrous sulfate  325 mg Oral TID PC  . irbesartan  300 mg Oral Daily  . pantoprazole  40 mg Oral Q1200  . rivaroxaban  10 mg Oral Q breakfast  . tamsulosin  0.4 mg Oral QODAY   Continuous Infusions: . lactated ringers Stopped (05/06/19 1215)  . lactated ringers 100 mL/hr at 05/07/19 1023  . methocarbamol (ROBAXIN) IV 500 mg (05/06/19 1016)     LOS: 1 day   Assessment & Plan:   Principal Problem:   H/O total knee replacement, right Active Problems:   Mixed hyperlipidemia   HTN (hypertension)   PAD (peripheral artery disease) (HCC)   CAD (coronary artery disease)   Chest pain   GERD (gastroesophageal reflux disease)  Right knee osteoarthritis: Status post total knee replacement.  Management per primary service/orthopedics.  History of CAD with chest pain: His pain resolved.  2 troponins negative so far.  EKG with no acute ST-T wave changes.  I see that cardiology has also been consulted and they are pursuing echo so we will defer further management to them.  Hypertension: Controlled.  Continue amlodipine and irbesartan as well as as needed hydralazine.  GERD: Continue PPI.  DVT prophylaxis: Per primary service Code Status: Full code Family Communication: Discussed with patient Disposition Plan: Per primary service   Time spent: 22 minutes   Darliss Cheney, MD [facility] Pager [phone]  If 7PM-7AM, please contact night-coverage [email or web address] Password [facility] 05/07/2019, 12:50 PM

## 2019-05-07 NOTE — Consult Note (Signed)
Medical Consultation   Joshua Wyatt  ZDG:[id number]  DOB: [birth year]  DOA: 05/06/2019  PCP: Jani Gravel, MD   Outpatient Specialists:   Requesting physician: Dr. Delfino Lovett of other  Reason for consultation: Chest pain   History of Present Illness:  Joshua Wyatt is an 73 y.o. male hypertension, hyperlipidemia, GERD, CAD, stent placement, deviated septum, PVD, CKD stage III, renal artery stenosis, who was admitted yesterday for right knee replacement by ortho. Pt is s/p of right knee replacement yesterday.  He developed chest pain last night. We are asked to consult for his chest pain.   Patient states that he developed chest pain last night, which is located in the center of chest, initially 7 out of 10 in severity, which subsided after treated with nitroglycerin, currently 1 out of 10 in severity.  Patient denies shortness of breath, cough, fever or chills.  Patient does not have nausea vomiting, diarrhea, abdominal pain, symptoms of UTI or unilateral weakness.  He states that he has some mild pain in the right knee after surgery. Pt has oxygen saturation 89-92-100% on RA.  Review of Systems:   General: no fevers, chills, no changes in body weight, no changes in appetite Skin: no rash HEENT: no blurry vision, hearing changes or sore throat Pulm: no dyspnea, coughing, wheezing CV: has chest pain, no palpitations, shortness of breath Abd: no nausea/vomiting, abdominal pain, diarrhea/constipation GU: no dysuria, hematuria, polyuria Ext: no arthralgias, myalgias Musculoskeletal: s/p of right knee replacement, with mild pain. Neuro: no weakness, numbness, or tingling    Past Medical History: Past Medical History:  Diagnosis Date  . Arthritis    "LITTLE IN MY LEFT HAND" (05/12/2018)  . Bilateral calf pain 03-02-2013   lower ext.doppler-mild arterial insufficiency to lower ext. this is a disease in val  . CAD (coronary artery disease)    a. cath 02/24/18 - 90% ost RCA;  40% ost & pro LAD  . Cerebral atherosclerosis 03-02-2013   carotid duplex- normal study  . Deviated septum    RIGHT  . GERD (gastroesophageal reflux disease)   . History of echocardiogram 08-02-2005   normal study   . History of kidney stones   . Hyperlipemia   . Hypertension 03-30-2013   PV Angiogram- rt. renal artery stent was widely open,50-60% lt. renal artery stenosis,lt. SFA stents patent with high grade above the knee  poplital stenosis  . Hyponatremia 02/03/2017  . Normal cardiac stress test 03-25-2013   EF 60%, normal stress test ,this is a presurgical  test  . Peripheral vascular disease ([place])   . PVD (peripheral vascular disease) ([place])    A. 2009: S/P PTA/stent to D SFA. B. 01/2017: angiosculpt atherectomy/drug-eluting balloon angioplasty to L SFA.  [name] Renal artery stenosis ([place]) 03-02-2013   renal artery doppler-this was an abnormal doppler    Past Surgical History: Past Surgical History:  Procedure Laterality Date  . APPENDECTOMY  1960's  . CAROTID ENDARTERECTOMY Bilateral 2000's  . CORONARY ANGIOPLASTY WITH STENT PLACEMENT  03/13/2018  . CORONARY STENT INTERVENTION N/A 03/13/2018   Procedure: CORONARY STENT INTERVENTION;  Surgeon: Lorretta Harp, MD;  Location: [place] CV LAB;  Service: Cardiovascular;  Laterality: N/A;  . CYSTOSCOPY W/ STONE MANIPULATION  "X 4 or 5"  . LEFT HEART CATH AND CORONARY ANGIOGRAPHY N/A 02/24/2018   Procedure: LEFT HEART CATH AND CORONARY ANGIOGRAPHY;  Surgeon: Lorretta Harp, MD;  Location: [facility]  INVASIVE CV LAB;  Service: Cardiovascular;  Laterality: N/A;  . LITHOTRIPSY  "several"  . LOWER EXTREMITY ANGIOGRAM N/A 10/11/2014   Procedure: LOWER EXTREMITY ANGIOGRAM;  Surgeon: Lorretta Harp, MD;  Location: [facility] CATH LAB;  Service: Cardiovascular;  Laterality: N/A;  . LOWER EXTREMITY ANGIOGRAPHY N/A 02/04/2017   Procedure: Lower Extremity Angiography;  Surgeon: Lorretta Harp, MD;  Location: [place] CV LAB;  Service: Cardiovascular;   Laterality: N/A;  . LOWER EXTREMITY ANGIOGRAPHY N/A 05/12/2018   Procedure: LOWER EXTREMITY ANGIOGRAPHY;  Surgeon: Lorretta Harp, MD;  Location: [place] CV LAB;  Service: Cardiovascular;  Laterality: N/A;  . PERCUTANEOUS STENT INTERVENTION  10/11/2014   Procedure: PERCUTANEOUS STENT INTERVENTION;  Surgeon: Lorretta Harp, MD;  Location: [facility] CATH LAB;  Service: Cardiovascular;;  right sfax2  . PERIPHERAL VASCULAR ATHERECTOMY  05/12/2018   Procedure: PERIPHERAL VASCULAR ATHERECTOMY;  Surgeon: Lorretta Harp, MD;  Location: [place] CV LAB;  Service: Cardiovascular;;  left SFA  . PERIPHERAL VASCULAR CATHETERIZATION N/A 01/03/2017   Procedure: Carotid PTA/Stent Intervention / Right;  Surgeon: Lorretta Harp, MD;  Location: [place] CV LAB;  Service: Cardiovascular;  Laterality: N/A;  . PERIPHERAL VASCULAR INTERVENTION Left 02/04/2017   Procedure: Peripheral Vascular Intervention;  Surgeon: Lorretta Harp, MD;  Location: [place] CV LAB;  Service: Cardiovascular;  Laterality: Left;  left SFA  . POPLITEAL ARTERY ANGIOPLASTY Left 03/30/2013  . TONSILLECTOMY  1960's     Allergies:   Allergies  Allergen Reactions  . Statins Swelling and Other (See Comments)    Muscle pain, also     Social History:  reports that he quit smoking about 5 years ago. His smoking use included cigars and cigarettes. He has a 58.00 pack-year smoking history. He has never used smokeless tobacco. He reports previous alcohol use. He reports that he does not use drugs.   Family History: Family History  Problem Relation Age of Onset  . Heart disease Father        CABG  . Healthy Sister   . Healthy Brother     Physical Exam: Vitals:   05/07/19 0109 05/07/19 0348 05/07/19 0352 05/07/19 0359  BP: 134/78 (!) 179/80 109/66 126/67  Pulse: 75 (!) 115 94 90  Resp: 16     Temp: 98.1 F (36.7 C)     TempSrc: Oral     SpO2: 97% 97% 95% 92%  Weight:      Height:        General: Not in acute  distress HEENT: PERRL, EOMI, no scleral icterus, No JVD or bruit Cardiac: S1/S2, RRR, No murmurs, gallops or rubs Pulm: No rales, wheezing, rhonchi or rubs. Abd: Soft, nondistended, nontender, no rebound pain, no organomegaly, BS present Ext: No edema. 2+DP/PT pulse bilaterally Musculoskeletal: s/p of right knee replacement, with mild tendernss Skin: No rashes.  Neuro: Alert and oriented X3, cranial nerves II-XII grossly intact, moves all extremities. Psych: Patient is not psychotic, no suicidal or hemocidal ideation.   Data reviewed:  I have personally reviewed following labs and imaging studies Labs:  CBC: No results for input(s): WBC, NEUTROABS, HGB, HCT, MCV, PLT in the last 168 hours.  Basic Metabolic Panel: No results for input(s): NA, K, CL, CO2, GLUCOSE, BUN, CREATININE, CALCIUM, MG, PHOS in the last 168 hours. GFR Estimated Creatinine Clearance: 39.9 mL/min (A) (by C-G formula based on SCr of 1.57 mg/dL (H)). Liver Function Tests: No results for input(s): AST, ALT, ALKPHOS, BILITOT, PROT, ALBUMIN in the last  168 hours. No results for input(s): LIPASE, AMYLASE in the last 168 hours. No results for input(s): AMMONIA in the last 168 hours. Coagulation profile No results for input(s): INR, PROTIME in the last 168 hours.  Cardiac Enzymes: No results for input(s): CKTOTAL, CKMB, CKMBINDEX, TROPONINI in the last 168 hours. BNP: Invalid input(s): POCBNP CBG: No results for input(s): GLUCAP in the last 168 hours. D-Dimer No results for input(s): DDIMER in the last 72 hours. Hgb A1c No results for input(s): HGBA1C in the last 72 hours. Lipid Profile No results for input(s): CHOL, HDL, LDLCALC, TRIG, CHOLHDL, LDLDIRECT in the last 72 hours. Thyroid function studies No results for input(s): TSH, T4TOTAL, T3FREE, THYROIDAB in the last 72 hours.  Invalid input(s): FREET3 Anemia work up No results for input(s): VITAMINB12, FOLATE, FERRITIN, TIBC, IRON, RETICCTPCT in the last 72  hours. Urinalysis No results found for: COLORURINE, APPEARANCEUR, [place], [place], [place], [place], [place], [place], [place], UROBILINOGEN, NITRITE, [place]   Microbiology Recent Results (from the past 240 hour(s))  Novel Coronavirus, NAA (hospital order; send-out to ref lab)     Status: None   Collection Time: 05/01/19 10:56 AM  Result Value Ref Range Status   SARS-CoV-2, NAA NOT DETECTED NOT DETECTED Final    Comment: (NOTE) Testing was performed using the cobas(R) SARS-CoV-2 test. This test was developed and its performance characteristics determined by [employer]. This test has not been FDA cleared or approved. This test has been authorized by FDA under an Emergency Use Authorization (EUA). This test is only authorized for the duration of time the declaration that circumstances exist justifying the authorization of the emergency use of in vitro diagnostic tests for detection of SARS-CoV-2 virus and/or diagnosis of COVID-19 infection under section 564(b)(1) of the Act, 21 U.S.C. 458KDX-8(P)(3), unless the authorization is terminated or revoked sooner. When diagnostic testing is negative, the possibility of a false negative result should be considered in the context of a patient's recent exposures and the presence of clinical signs and symptoms consistent with COVID-19. An individual without symptoms of COVID-19 and who is not shedding SARS-CoV-2 virus would expect to have  a negative (not detected) result in this assay. Performed At: [facility] [address] [place], [place] [id number] Rush Farmer MD BH:[id number]    [place]  Final    Comment: Performed at [facility], [place] [address] [place], [place] [address]       Inpatient Medications:   Scheduled Meds: . acetaminophen  1,000 mg Oral Q6H  . amLODipine  10 mg Oral Daily  . atorvastatin  40 mg Oral QPM  . docusate sodium  100 mg Oral BID  .  ezetimibe  10 mg Oral Daily  . ferrous sulfate  325 mg Oral TID PC  . irbesartan  300 mg Oral Daily  . rivaroxaban  10 mg Oral Q breakfast  . tamsulosin  0.4 mg Oral QODAY   Continuous Infusions: . lactated ringers Stopped (05/06/19 1215)  . lactated ringers 100 mL/hr at 05/06/19 1321  . methocarbamol (ROBAXIN) IV 500 mg (05/06/19 1016)     Radiological Exams on Admission: No results found.  Impression/Recommendations Principal Problem:   H/O total knee replacement, right Active Problems:   Mixed hyperlipidemia   HTN (hypertension)   PAD (peripheral artery disease) (HCC)   CAD (coronary artery disease)   Chest pain   GERD (gastroesophageal reflux disease)   H/O total knee replacement, right:  -pain control per ortho primary team -pt is on  Ancer PPX -pt is on Xarelo for DVT PPX  Hx of CAD and chest pain: s/p of stent.  Potential differential diagnosis for his chest pain include demand ischemia and PE.  Patient has oxygen saturation 89-90 2-100% on room air.  His chest pain responded to nitroglycerin, currently is very mild.  Patient denies shortness of breath, at this moment, I have low suspicions for PE.  In addition, patient will be started on Xarelto today. - cycle CE q6 x3 and repeat EKG in the am  - prn Nitroglycerin, dilaudid, and aspirin, lipitor, zetia - Risk factor stratification: will check FLP and A1C - card consult is requested via Epic - stat EKG - CXR -tele monitoring-->will transfer to tele bed  Mixed hyperlipidemia: -lipitor and Zetia  HTN (hypertension): -Amlodipine, irbesartan -IV hydralazine as needed  PAD (peripheral artery disease) ([place]): -pt was on plavix which is on hold due to surgery  GERD (gastroesophageal reflux disease) -protonix   Thank you for this consultation.  Our [facility] hospitalist team will follow the patient with you.   Time Spent: 40 min  Ivor Costa M.D. [facility] 05/07/2019, 4:55 AM

## 2019-05-07 NOTE — Progress Notes (Signed)
Pt transferred to 4W $Rem'@0610'myHB$ .

## 2019-05-07 NOTE — Progress Notes (Signed)
Surgeon consulted hospitalist and hospitalist spoke with pt on the phone.   Right after phone call, pt experienced 5/10 chest pain, and climbing, again. 1 dose SL nitro administered and pain is now down to 1/10.   EKG results show abnormal. On-call Hospitalist notified.

## 2019-05-07 NOTE — Progress Notes (Signed)
Physical Therapy Treatment Patient Details Name: Joshua Wyatt MRN: [id number] DOB: [birth year] Today's Date: 05/07/2019    History of Present Illness 73 yo male s/p R TKR on 05/06/19. PMH includes CAD s/p coronary artery stent placement, second degree AV block, PAD, CKD, bilateral carotid artery disease, HLD, LE stent placement.     PT Comments    Pt ambulated in hallway short distance however progressed since this morning.  Pt then assisted back to bed and performed LE exercises.  Provided pt's ice packs (from 3rd floor freezer, labeled with pt's name) however unable to find black wrapping that holds freezer ice packs.  Pt educated to use towel for skin protection and check skin when icing knee.   Follow Up Recommendations  Follow surgeon's recommendation for DC plan and follow-up therapies;Supervision for mobility/OOB(HHPT)     Equipment Recommendations  Rolling walker with 5" wheels    Recommendations for Other Services       Precautions / Restrictions Precautions Precautions: Knee;Fall Required Braces or Orthoses: Knee Immobilizer - Right Restrictions Weight Bearing Restrictions: No Other Position/Activity Restrictions: WBAT    Mobility  Bed Mobility Overal bed mobility: Needs Assistance Bed Mobility: Supine to Sit;Sit to Supine     Supine to sit: Min guard Sit to supine: Min assist   General bed mobility comments: verbal cues for self assist, assist for LE onto bed  Transfers Overall transfer level: Needs assistance Equipment used: Rolling walker (2 wheeled) Transfers: Sit to/from Stand Sit to Stand: Min assist         General transfer comment: verbal cues for UE and LE positioning, assist to rise, steady and control descent  Ambulation/Gait Ambulation/Gait assistance: Min assist Gait Distance (Feet): 40 Feet Assistive device: Rolling walker (2 wheeled) Gait Pattern/deviations: Step-to pattern;Decreased stride length;Trunk flexed;Antalgic Gait velocity:  decr    General Gait Details: verbal cues for sequence, RW positioning, step length, posture   Stairs             Wheelchair Mobility    Modified Rankin (Stroke Patients Only)       Balance                                            Cognition Arousal/Alertness: Awake/alert Behavior During Therapy: WFL for tasks assessed/performed Overall Cognitive Status: Within Functional Limits for tasks assessed                                        Exercises Total Joint Exercises Ankle Circles/Pumps: AROM;10 reps;Both Quad Sets: AROM;10 reps;Left Short Arc Quad: AAROM;10 reps;Right Heel Slides: Right;10 reps;AAROM Hip ABduction/ADduction: AAROM;10 reps;Right Straight Leg Raises: AAROM;10 reps;Right    General Comments        Pertinent Vitals/Pain Pain Assessment: Faces Faces Pain Scale: Hurts little more Pain Location: R knee  Pain Descriptors / Indicators: Aching;Sore Pain Intervention(s): Monitored during session;Repositioned;Ice applied    Home Living                      Prior Function            PT Goals (current goals can now be found in the care plan section) Progress towards PT goals: Progressing toward goals    Frequency    7X/week  PT Plan Current plan remains appropriate    Co-evaluation              AM-PAC PT "6 Clicks" Mobility   Outcome Measure  Help needed turning from your back to your side while in a flat bed without using bedrails?: A Little Help needed moving from lying on your back to sitting on the side of a flat bed without using bedrails?: A Little Help needed moving to and from a bed to a chair (including a wheelchair)?: A Little Help needed standing up from a chair using your arms (e.g., wheelchair or bedside chair)?: A Little Help needed to walk in hospital room?: A Little Help needed climbing 3-5 steps with a railing? : A Lot 6 Click Score: 17    End of Session  Equipment Utilized During Treatment: Right knee immobilizer;Gait belt Activity Tolerance: Patient tolerated treatment well Patient left: in bed;with call bell/phone within reach;with bed alarm set Nurse Communication: Mobility status PT Visit Diagnosis: Other abnormalities of gait and mobility (R26.89)     Time: 1456-1520 PT Time Calculation (min) (ACUTE ONLY): 24 min  Charges:  $Gait Training: 8-22 mins $Therapeutic Exercise: 8-22 mins                     Carmelia Bake, PT, DPT Acute Rehabilitation Services Office: [phone] Pager: John Day E 05/07/2019, 4:25 PM

## 2019-05-07 NOTE — Progress Notes (Signed)
Physical Therapy Treatment Patient Details Name: Joshua Wyatt MRN: [id number] DOB: 11-10-1946 Today's Date: 05/07/2019    History of Present Illness 73 yo male s/p R TKR on 05/06/19. PMH includes CAD s/p coronary artery stent placement, second degree AV block, PAD, CKD, bilateral carotid artery disease, HLD, LE stent placement.     PT Comments    Pt ambulated short distance and returned to recliner.  Pt performed ankle pumps and quad sets then wished to rest.  Will return for afternoon session.  Pt reports plan for d/c home tomorrow.  Follow Up Recommendations  Follow surgeon's recommendation for DC plan and follow-up therapies;Supervision for mobility/OOB(HHPT)     Equipment Recommendations  Rolling walker with 5" wheels    Recommendations for Other Services       Precautions / Restrictions Precautions Precautions: Knee;Fall Required Braces or Orthoses: Knee Immobilizer - Right Restrictions Weight Bearing Restrictions: No Other Position/Activity Restrictions: WBAT    Mobility  Bed Mobility               General bed mobility comments: pt up in recliner on arrival  Transfers Overall transfer level: Needs assistance Equipment used: Rolling walker (2 wheeled) Transfers: Sit to/from Stand Sit to Stand: Min assist         General transfer comment: verbal cues for UE and LE positioning, assist to rise, steady and control descent  Ambulation/Gait Ambulation/Gait assistance: Min assist Gait Distance (Feet): 24 Feet Assistive device: Rolling walker (2 wheeled) Gait Pattern/deviations: Step-to pattern;Decreased stride length;Trunk flexed;Antalgic Gait velocity: decr    General Gait Details: verbal cues for sequence, RW positioning, step length, posture   Stairs             Wheelchair Mobility    Modified Rankin (Stroke Patients Only)       Balance                                            Cognition Arousal/Alertness:  Awake/alert Behavior During Therapy: WFL for tasks assessed/performed Overall Cognitive Status: Within Functional Limits for tasks assessed                                        Exercises Total Joint Exercises Ankle Circles/Pumps: AROM;10 reps;Both Quad Sets: AROM;10 reps;Left    General Comments        Pertinent Vitals/Pain Pain Assessment: Faces Faces Pain Scale: Hurts a little bit Pain Location: R knee  Pain Descriptors / Indicators: Aching;Sore Pain Intervention(s): Monitored during session;Premedicated before session;Repositioned;Ice applied    Home Living                      Prior Function            PT Goals (current goals can now be found in the care plan section) Progress towards PT goals: Progressing toward goals    Frequency    7X/week      PT Plan Current plan remains appropriate    Co-evaluation              AM-PAC PT "6 Clicks" Mobility   Outcome Measure  Help needed turning from your back to your side while in a flat bed without using bedrails?: A Little Help needed moving from lying on your back  to sitting on the side of a flat bed without using bedrails?: A Little Help needed moving to and from a bed to a chair (including a wheelchair)?: A Little Help needed standing up from a chair using your arms (e.g., wheelchair or bedside chair)?: A Little Help needed to walk in hospital room?: A Little Help needed climbing 3-5 steps with a railing? : A Lot 6 Click Score: 17    End of Session Equipment Utilized During Treatment: Right knee immobilizer;Gait belt Activity Tolerance: Patient tolerated treatment well Patient left: in chair;with chair alarm set;with call bell/phone within reach Nurse Communication: Mobility status PT Visit Diagnosis: Other abnormalities of gait and mobility (R26.89)     Time: 7793-9688 PT Time Calculation (min) (ACUTE ONLY): 16 min  Charges:  $Gait Training: 8-22 mins                      Carmelia Bake, PT, DPT Acute Rehabilitation Services Office: [phone] Pager: [phone]  Trena Platt 05/07/2019, 2:09 PM

## 2019-05-07 NOTE — Progress Notes (Signed)
CRITICAL VALUE ALERT  Critical Value: Troponin 0.04  Date & Time Notied:  05/07/19, 1700  Provider Notified: Yes  Orders Received/Actions taken: Awaiting orders.

## 2019-05-07 NOTE — Progress Notes (Signed)
  Echocardiogram 2D Echocardiogram has been performed.  Jennette Dubin 05/07/2019, 3:01 PM

## 2019-05-07 NOTE — Consult Note (Signed)
Cardiology Consultation:   Patient ID: RAYYAN BURLEY; [id number]; 1946-09-24   Admit date: 05/06/2019 Date of Consult: 05/07/2019  Primary Care Provider: Jani Gravel, MD Primary Cardiologist: [name], MD Primary Electrophysiologist:  None   Patient Profile:   KAMARRI LOVVORN is a 73 y.o. male with a PMH of CAD s/p PCI/DES to RCA 03/2018, HTN, HLD, PVD s/p bilateral SFA stenting , renal artery stenosis s/p stenting, carotid artery disease s/p right CEA, CKD stage 3, who presented for right knee replacement (post-op day 1) who is being seen today for the evaluation of chest pain at the request of Dr. Blaine Hamper.  History of Present Illness:   Mr. Sistrunk presented for right knee replacement 05/06/2019. In the early morning hours following surgery, he reported sudden onset chest pain rated as a 7/10 which improved to 1/10 after receiving SL nitro.  He had associated diaphoresis but no SOB, dizziness, lightheadedness, N/V, radiation of pain, or syncope.   He was last seen by cardiology at an outpatient visit with Dr. Gwenlyn Found 12/2018 for preoperative assessment. He was without anginal or claudication complaints at that time and cleared for surgery with recommendations to hold plavix one week prior to procedures beginning in 03/2019. His last ischemic evaluation was a LHC 02/2018 which revealed 90% Ost RCA stenosis, 40% LM stenosis, and 40% pLAD stenosis, recommended for PCI of the RCA via femoral approach which occurred on catheterization 03/13/2018. His last echocardiogram was in 2017 and showed EF 55-60%, G1DD, no RWMA, mild LVH, and mild AS.   At the time of this evaluation he is chest pain free and sitting comfortably in the bedside chair. He states he feels his chest pain was related to anxiety. Prior to surgery he denied having any chest pain, SOB, palpitations, orthopnea, PND, or LE edema. He was limited in activity due to knee pain but without anginal complaints when he exerted himself. He denies recent  SL nitro use.   Hospital course: intermittently hypertensive, tachycardic, and hypoxic (improved on O2 via [place]); otherwise afebrile and normal RR. Labs notable for NA 132, K 4.6, Cr 1.43 (baseline), Hgb 9.0 (12.2 on preop labs 04/24/2019), PLT 167, WBC 14.6, Trop negative x1, LDL 62, COVID 19 negative. CXR with mild atelectasis but no acute findings. EKG with sinus rhythm, rate 96, PAC, no STE/D, non-specific T wave abnormalities - no significant change from previous. Medicine was consulted to assist with chest pain work-up. Cardiology asked to see for chest pain.   Past Medical History:  Diagnosis Date  . Arthritis    "LITTLE IN MY LEFT HAND" (05/12/2018)  . Bilateral calf pain 03-02-2013   lower ext.doppler-mild arterial insufficiency to lower ext. this is a disease in val  . CAD (coronary artery disease)    a. cath 02/24/18 - 90% ost RCA; 40% ost & pro LAD  . Cerebral atherosclerosis 03-02-2013   carotid duplex- normal study  . Deviated septum    RIGHT  . GERD (gastroesophageal reflux disease)   . History of echocardiogram 08-02-2005   normal study   . History of kidney stones   . Hyperlipemia   . Hypertension 03-30-2013   PV Angiogram- rt. renal artery stent was widely open,50-60% lt. renal artery stenosis,lt. SFA stents patent with high grade above the knee  poplital stenosis  . Hyponatremia 02/03/2017  . Normal cardiac stress test 03-25-2013   EF 60%, normal stress test ,this is a presurgical  test  . Peripheral vascular disease ([place])   .  PVD (peripheral vascular disease) ([place])    A. 2009: S/P PTA/stent to D SFA. B. 01/2017: angiosculpt atherectomy/drug-eluting balloon angioplasty to L SFA.  [name] Renal artery stenosis ([place]) 03-02-2013   renal artery doppler-this was an abnormal doppler    Past Surgical History:  Procedure Laterality Date  . APPENDECTOMY  1960's  . CAROTID ENDARTERECTOMY Bilateral 2000's  . CORONARY ANGIOPLASTY WITH STENT PLACEMENT  03/13/2018  . CORONARY STENT  INTERVENTION N/A 03/13/2018   Procedure: CORONARY STENT INTERVENTION;  Surgeon: Lorretta Harp, MD;  Location: [place] [facility];  Service: Cardiovascular;  Laterality: N/A;  . CYSTOSCOPY W/ STONE MANIPULATION  "X 4 or 5"  . LEFT HEART CATH AND CORONARY ANGIOGRAPHY N/A 02/24/2018   Procedure: LEFT HEART CATH AND CORONARY ANGIOGRAPHY;  Surgeon: Lorretta Harp, MD;  Location: [place] [facility];  Service: Cardiovascular;  Laterality: N/A;  . LITHOTRIPSY  "several"  . LOWER EXTREMITY ANGIOGRAM N/A 10/11/2014   Procedure: LOWER EXTREMITY ANGIOGRAM;  Surgeon: Lorretta Harp, MD;  Location: [facility] CATH LAB;  Service: Cardiovascular;  Laterality: N/A;  . LOWER EXTREMITY ANGIOGRAPHY N/A 02/04/2017   Procedure: Lower Extremity Angiography;  Surgeon: Lorretta Harp, MD;  Location: [place] [facility];  Service: Cardiovascular;  Laterality: N/A;  . LOWER EXTREMITY ANGIOGRAPHY N/A 05/12/2018   Procedure: LOWER EXTREMITY ANGIOGRAPHY;  Surgeon: Lorretta Harp, MD;  Location: [place] [facility];  Service: Cardiovascular;  Laterality: N/A;  . PERCUTANEOUS STENT INTERVENTION  10/11/2014   Procedure: PERCUTANEOUS STENT INTERVENTION;  Surgeon: Lorretta Harp, MD;  Location: [facility] CATH LAB;  Service: Cardiovascular;;  right sfax2  . PERIPHERAL VASCULAR ATHERECTOMY  05/12/2018   Procedure: PERIPHERAL VASCULAR ATHERECTOMY;  Surgeon: Lorretta Harp, MD;  Location: [place] [facility];  Service: Cardiovascular;;  left SFA  . PERIPHERAL VASCULAR CATHETERIZATION N/A 01/03/2017   Procedure: Carotid PTA/Stent Intervention / Right;  Surgeon: Lorretta Harp, MD;  Location: [place] [facility];  Service: Cardiovascular;  Laterality: N/A;  . PERIPHERAL VASCULAR INTERVENTION Left 02/04/2017   Procedure: Peripheral Vascular Intervention;  Surgeon: Lorretta Harp, MD;  Location: [place] [facility];  Service: Cardiovascular;  Laterality: Left;  left SFA  . POPLITEAL ARTERY ANGIOPLASTY Left 03/30/2013  . TONSILLECTOMY  1960's      Home Medications:  Prior to Admission medications   Medication Sig Start Date End Date Taking? Authorizing Provider  amLODipine (NORVASC) 10 MG tablet TAKE 1 TABLET BY MOUTH ONCE DAILY 08/26/18  Yes Lorretta Harp, MD  aspirin 81 MG tablet Take 81 mg by mouth daily.   Yes [provider]  atorvastatin (LIPITOR) 40 MG tablet Take 40 mg by mouth every evening.    Yes [provider]  Biotin 5000 MCG CAPS Take 5,000 mcg by mouth daily.   Yes [provider]  clopidogrel (PLAVIX) 75 MG tablet Take 1 tablet by mouth once daily Patient taking differently: Take 75 mg by mouth daily.  04/11/19  Yes Lorretta Harp, MD  ezetimibe (ZETIA) 10 MG tablet TAKE 1 TABLET BY MOUTH ONCE DAILY 02/04/19  Yes Lorretta Harp, MD  irbesartan (AVAPRO) 300 MG tablet TAKE 1 TABLET BY MOUTH ONCE DAILY 01/26/19  Yes [facility], Tami Lin, PA  pantoprazole (PROTONIX) 40 MG tablet Take 1 tablet (40 mg total) by mouth daily. 12/09/18  Yes Lorretta Harp, MD  tamsulosin (FLOMAX) 0.4 MG CAPS capsule Take 0.4 mg by mouth every other day. AT HS   Yes [provider]  nitroGLYCERIN (NITROSTAT) 0.4 MG SL tablet Place 1 tablet (0.4 mg total) under the tongue every 5 (five) minutes as needed. Patient taking differently: Place 0.4 mg under the tongue every 5 (five) minutes as needed for chest pain.  02/25/18   Leanor Kail, PA    Inpatient Medications: Scheduled Meds: . amLODipine  10 mg Oral Daily  . aspirin EC  81 mg Oral Daily  . atorvastatin  40 mg Oral QPM  . docusate sodium  100 mg Oral BID  . ezetimibe  10 mg Oral Daily  . ferrous sulfate  325 mg Oral TID PC  . irbesartan  300 mg Oral Daily  . pantoprazole  40 mg Oral Q1200  . rivaroxaban  10 mg Oral Q breakfast  . tamsulosin  0.4 mg Oral QODAY   Continuous Infusions: . lactated ringers Stopped (05/06/19 1215)  . lactated ringers 100 mL/hr at 05/06/19 1321  . methocarbamol (ROBAXIN) IV 500 mg (05/06/19 1016)   PRN  Meds: acetaminophen, alum & mag hydroxide-simeth, bisacodyl, hydrALAZINE, HYDROmorphone (DILAUDID) injection, menthol-cetylpyridinium **OR** phenol, methocarbamol **OR** methocarbamol (ROBAXIN) IV, metoCLOPramide **OR** metoCLOPramide (REGLAN) injection, nitroGLYCERIN, ondansetron **OR** ondansetron (ZOFRAN) IV, oxyCODONE, oxyCODONE, polyethylene glycol, sodium phosphate  Allergies:    Allergies  Allergen Reactions  . Statins Swelling and Other (See Comments)    Muscle pain, also    Social History:   Social History   Socioeconomic History  . Marital status: Married    Spouse name: Not on file  . Number of children: Not on file  . Years of education: Not on file  . Highest education level: Not on file  Occupational History  . Not on file  Social Needs  . Financial resource strain: Not on file  . Food insecurity:    Worry: Not on file    Inability: Not on file  . Transportation needs:    Medical: Not on file    Non-medical: Not on file  Tobacco Use  . Smoking status: Former Smoker    Packs/day: 1.00    Years: 58.00    Pack years: 58.00    Types: Cigars, Cigarettes    Last attempt to quit: 10/18/2013    Years since quitting: 5.5  . Smokeless tobacco: Never Used  Substance and Sexual Activity  . Alcohol use: Not Currently  . Drug use: Never  . Sexual activity: Yes  Lifestyle  . Physical activity:    Days per week: Not on file    Minutes per session: Not on file  . Stress: Not on file  Relationships  . Social connections:    Talks on phone: Not on file    Gets together: Not on file    Attends religious service: Not on file    Active member of club or organization: Not on file    Attends meetings of clubs or organizations: Not on file    Relationship status: Not on file  . Intimate partner violence:    Fear of current or ex partner: Not on file    Emotionally abused: Not on file    Physically abused: Not on file    Forced sexual activity: Not on file  Other Topics  Concern  . Not on file  Social History Narrative  . Not on file    Family History:    Family History  Problem Relation Age of Onset  . Heart disease Father        CABG  . Healthy Sister   . Healthy Brother  ROS:  Please see the history of present illness.   All other ROS reviewed and negative.     Physical Exam/Data:   Vitals:   05/07/19 0359 05/07/19 0455 05/07/19 0615 05/07/19 0641  BP: 126/67 (!) 155/75 (!) 179/95 (!) 153/72  Pulse: 90 (!) 113 (!) 102 86  Resp:  18 19   Temp:  98.2 F (36.8 C) 98 F (36.7 C)   TempSrc:  Oral Oral   SpO2: 92% 95% 98%   Weight:      Height:        Intake/Output Summary (Last 24 hours) at 05/07/2019 0751 Last data filed at 05/07/2019 0600 Gross per 24 hour  Intake 4641.33 ml  Output 1685 ml  Net 2956.33 ml   Filed Weights   05/06/19 0526  Weight: 73.9 kg   Body mass index is 24.77 kg/m.  General:  Well nourished, well developed, in no acute distress HEENT: sclera anicteric  Lymph: no adenopathy Neck: no JVD Endocrine:  No thryomegaly Vascular: No carotid bruits; distal pulses 2+ bilaterally Cardiac:  normal S1, S2; RRR; +murmur, no rubs/gallops Lungs:  clear to auscultation bilaterally, no wheezing, rhonchi or rales  Abd: NABS, soft, nontender, no hepatomegaly Ext: no edema Musculoskeletal:  S/p right knee replacement with ice packs applied to right knee Skin: warm and dry  Neuro:  CNs 2-12 intact, no focal abnormalities noted Psych:  Normal affect   EKG:  The EKG was personally reviewed and demonstrates:  sinus rhythm, rate 96, PAC, no STE/D, non-specific T wave abnormalities - no significant change from previous. Telemetry:  Telemetry was personally reviewed and demonstrates:  Sinus rhythm  Relevant CV Studies: Left heart catheterization 02/2018:  Ost RCA lesion is 90% stenosed.  Ost LM to Mid LM lesion is 40% stenosed.  Dist LM to Prox LAD lesion is 40% stenosed.  The left ventricular systolic function  is normal.  LV end diastolic pressure is normal.  The left ventricular ejection fraction is 55-65% by visual estimate. IMPRESSION:Mr. Escue has mild left main and proximal segmental LAD disease that did not appear to be hemodynamically significant.he has a dominant right with anterior takeoff with what appears to be a hemodynamically significant aorto ostial lesion with dampening with engagement using the AR-1 catheter. This vessel has a most a 40-50% mid stenosis. He had normal LV function. I have reviewed his interventions with Dr. Katrinka Blazing and we both agree that this ostial RCA appears to be significant and probably requires percutaneous revascularization which would optimally be done via the femoral approach. The sheath was removed and a TR band was placed on the right breast to achieve patent hemostasis. The patient left the lab in stable condition. He'll be discharged home today as an outpatient and will see me back in the office later this week at which point we will arrange for him to undergo RCA aorto ostial stenting.  Coronary intervention 03/2018:  Ost RCA lesion is 90% stenosed.  Mid RCA lesion is 40% stenosed.  A stent was successfully placed.  Post intervention, there is a 10% residual stenosis.  Echocardiogram 2017: Study Conclusions  - Left ventricle: The cavity size was normal. Wall thickness was   increased in a pattern of mild LVH. Systolic function was normal.   The estimated ejection fraction was in the range of 55% to 60%.   Wall motion was normal; there were no regional wall motion   abnormalities. Doppler parameters are consistent with abnormal   left ventricular relaxation (grade  1 diastolic dysfunction). - Aortic valve: There was mild stenosis. Mean gradient (S): 12 mm   Hg. Peak gradient (S): 25 mm Hg. - Mitral valve: There was trivial regurgitation. - Left atrium: The atrium was mildly dilated. - Right ventricle: The cavity size was normal. Systolic function    was normal. - Tricuspid valve: Peak RV-RA gradient (S): 27 mm Hg. - Pulmonary arteries: PA peak pressure: 30 mm Hg (S). - Inferior vena cava: The vessel was normal in size. The   respirophasic diameter changes were in the normal range (>= 50%),   consistent with normal central venous pressure.  Impressions:  - Normal LV size with mild LV hypertrophy. EF 55-60%. Normal RV   size and systolic function. Mild aortic stenosis.  Laboratory Data:  Chemistry Recent Labs  Lab 05/07/19 0427  NA 132*  K 4.6  CL 102  CO2 19*  GLUCOSE 140*  BUN 18  CREATININE 1.43*  CALCIUM 9.2  GFRNONAA 48*  GFRAA 56*  ANIONGAP 11    No results for input(s): PROT, ALBUMIN, AST, ALT, ALKPHOS, BILITOT in the last 168 hours. Hematology Recent Labs  Lab 05/07/19 0427  WBC 14.6*  RBC 3.02*  HGB 9.0*  HCT 27.8*  MCV 92.1  MCH 29.8  MCHC 32.4  RDW 12.4  PLT 167   Cardiac Enzymes Recent Labs  Lab 05/07/19 0427  TROPONINI <0.03   No results for input(s): TROPIPOC in the last 168 hours.  BNPNo results for input(s): BNP, PROBNP in the last 168 hours.  DDimer No results for input(s): DDIMER in the last 168 hours.  Radiology/Studies:  Dg Chest Port 1 View  Result Date: 05/07/2019 CLINICAL DATA:  Chest pain. EXAM: PORTABLE CHEST 1 VIEW COMPARISON:  01/24/2017. FINDINGS: Mediastinum and hilar structures normal. Mild right mid lung field and left base subsegmental atelectasis. No focal alveolar infiltrate. No pleural effusion or pneumothorax. Heart size normal. Thoracic spine scoliosis and degenerative change. Degenerative changes both shoulders. Diffuse osteopenia. Surgical clips and a stent noted over the right neck. Left carotid calcification noted. IMPRESSION: 1. Mild right mid lung field and left base subsegmental atelectasis. No acute infiltrates. Heart size normal. 2. Surgical clips and a vascular stent noted over the right neck. Left carotid calcification consistent atherosclerotic vascular  disease. Electronically Signed   By: Marcello Moores  Register   On: 05/07/2019 06:12    Assessment and Plan:   1. Chest pain in patient with CAD s/p PCI/DES to RCA 03/2018: patient underwent right knee replacement 05/06/2019 and subsequently developed chest pain overnight following surgery. Pain improved from a 7/10 > 1/10 after 1 SL nitro. Trop negative x1. EKG non-ischemic. Last ischemic eval was a LHC March/April 2019 with 40% LM and LAD stenosis and 90% RCA stenosis managed with PCI/DES with 10% residual stenosis. Last echo in 2017 showed EF 55-60%, G1DD, no RWMA, and mild AS. He is a vasculopath with history of carotid artery disease, renal artery stenosis, and PAD all s/p interventions. He has been on aspirin and plavix since coronary artery stent placement which was interrupted 1 week ago for knee replacement.  - Continue to trend trop x3 or to peak - Will check an echocardiogram - If troponins remain negative and echo does not reveal any change in EF or RWMA, do not anticipate further inpatient ischemic work-up.  - Continue aspirin and statin - Continue amlodipine - Would benefit from restarting plavix when cleared to do so by ortho - Could consider adding carvedilol for both antianginal and  BP effects  2. S/p right knee replacement: post-op day 1. From an ortho standpoint may be able to be discharged tomorrow.  - Continue management per ortho  3. HTN: BP elevated - appears to be above goal at prior outpatient visits - Continue home amlodipine and irbesartan for now - Could consider adding carvedilol for both antianginal and BP effects  4. HLD: LDL 62 on labs this AM - at goal of <70 - Continue atorvastatin and zetia  5. PVD: bilateral SFA disease s/p PCI/DES with repeat PCI to L SFA 05/2018. No claudication complaints - Continue aspirin and statin - Restart plavix when cleared to do so by ortho  6. Carotid artery disease: s/p right CEA with patent stent and 1-39% stenosis in the left  carotid artery on duplex 12/2018 - Continue routine monitoring outpatient    For questions or updates, please contact [place] Please consult [email or web address] for contact info under Cardiology/STEMI.   Signed, Abigail Butts, PA-C  05/07/2019 7:51 AM [phone]

## 2019-05-07 NOTE — TOC Transition Note (Signed)
Transition of Care [facility]) - CM/SW Discharge Note   Patient Details  Name: Joshua Wyatt MRN: [id number] Date of Birth: March 17, 1946  [facility] CM/SW Contact:  Joaquin Courts, RN Phone Number: 05/07/2019, 10:10 AM   Clinical Narrative:    Pt set up with [facility] for [facility] services. DME needs set up with [employer], rep Zack given referral. [employer] to deliver RW. Pt declines 3-in-1.           Patient Goals and CMS Choice        Discharge Placement                       Discharge Plan and Services                                     Social Determinants of Health (SDOH) Interventions     Readmission Risk Interventions No flowsheet data found.

## 2019-05-07 NOTE — Progress Notes (Signed)
Upon arrival to unit pt complains of 8 out of 10 chest pain. Pt reports pain at mid chest region as sharp, constant, and tight. Pt placed on telemetry and 2L [place], vital signs and EKG obtained. PRN nitro given. Pt reports relief post interventions. Report given to oncoming RN.

## 2019-05-07 NOTE — Progress Notes (Signed)
Care Management contacted this nurse regarding needing an order from the MD for a walker at home for the pt. This nurse called for Dr. Gladstone Lighter, who was unavailable, and left a message with the office detailing care managements needs.

## 2019-05-07 NOTE — Progress Notes (Signed)
Received report on patient, agree with previous nurse's assessment. Will continue to monitor.

## 2019-05-07 NOTE — Progress Notes (Signed)
Subjective: 1 Day Post-Op Procedure(s) (LRB): TOTAL KNEE ARTHROPLASTY (Right) Patient reports pain as 1 on 0-10 scaleHe was transferred last night because of chest Pain. He feels this was simple anxiety. He is asymptomatic now and denies any chest pain. Dressing changed and Hemovac DCd . Wound looks fine. I plan to keep him another day to observe him. If he is well I will let him go home tomorrow.HBg 9.   Objective: Vital signs in last 24 hours: Temp:  [97.6 F (36.4 C)-98.4 F (36.9 C)] 98 F (36.7 C) (05/28 0615) Pulse Rate:  [58-115] 86 (05/28 0641) Resp:  [13-19] 19 (05/28 0615) BP: (104-179)/(59-95) 153/72 (05/28 0641) SpO2:  [89 %-100 %] 98 % (05/28 0615)  Intake/Output from previous day: 05/27 0701 - 05/28 0700 In: 3421.4 [P.O.:160; I.V.:3061.4; IV Piggyback:199.9] Out: 1610 [Urine:1225; Drains:360; Blood:200] Intake/Output this shift: No intake/output data recorded.  Recent Labs    05/07/19 0427  HGB 9.0*   Recent Labs    05/07/19 0427  WBC 14.6*  RBC 3.02*  HCT 27.8*  PLT 167   Recent Labs    05/07/19 0427  NA 132*  K 4.6  CL 102  CO2 19*  BUN 18  CREATININE 1.43*  GLUCOSE 140*  CALCIUM 9.2   No results for input(s): LABPT, INR in the last 72 hours.  Neurovascular intact Dorsiflexion/Plantar flexion intact No cellulitis present   Assessment/Plan: 1 Day Post-Op Procedure(s) (LRB): TOTAL KNEE ARTHROPLASTY (Right) Up with therapy   Anticipated LOS equal to or greater than 2 midnights due to - Age 75 and older with one or more of the following:  - Obesity  - Expected need for hospital services (PT, OT, Nursing) required for safe  discharge  - Anticipated need for postoperative skilled nursing care or inpatient rehab  - Active co-morbidities: Mild chest pain last night. OR   - Unanticipated findings during/Post Surgery: None  - Patient is a high risk of re-admission due to: None    Latanya Maudlin 05/07/2019, 7:16 AM

## 2019-05-07 NOTE — Progress Notes (Signed)
Pt stated he was having 7-8/10 chest pain. Described as dull uncomfortable constant pain, mid chest, gradually getting worse. Pt was sweaty. One dose of SL nitro was administered and pain went down to a 1/10.  Pt stated "I've had the pain all night." When asked why he didn't mention it, he said he "thought it would just go away, but it didn't, it got worse."   MD notified. Orders for 12 lead EKG and Troponin labs ordered.

## 2019-05-08 DIAGNOSIS — Z87891 Personal history of nicotine dependence: Secondary | ICD-10-CM | POA: Diagnosis not present

## 2019-05-08 DIAGNOSIS — I2510 Atherosclerotic heart disease of native coronary artery without angina pectoris: Secondary | ICD-10-CM | POA: Diagnosis not present

## 2019-05-08 DIAGNOSIS — K219 Gastro-esophageal reflux disease without esophagitis: Secondary | ICD-10-CM | POA: Diagnosis not present

## 2019-05-08 DIAGNOSIS — I129 Hypertensive chronic kidney disease with stage 1 through stage 4 chronic kidney disease, or unspecified chronic kidney disease: Secondary | ICD-10-CM | POA: Diagnosis not present

## 2019-05-08 DIAGNOSIS — Z87442 Personal history of urinary calculi: Secondary | ICD-10-CM | POA: Diagnosis not present

## 2019-05-08 DIAGNOSIS — I25119 Atherosclerotic heart disease of native coronary artery with unspecified angina pectoris: Secondary | ICD-10-CM | POA: Diagnosis not present

## 2019-05-08 DIAGNOSIS — R079 Chest pain, unspecified: Secondary | ICD-10-CM | POA: Diagnosis not present

## 2019-05-08 DIAGNOSIS — Z888 Allergy status to other drugs, medicaments and biological substances status: Secondary | ICD-10-CM | POA: Diagnosis not present

## 2019-05-08 DIAGNOSIS — M1711 Unilateral primary osteoarthritis, right knee: Secondary | ICD-10-CM | POA: Diagnosis not present

## 2019-05-08 DIAGNOSIS — Z79899 Other long term (current) drug therapy: Secondary | ICD-10-CM | POA: Diagnosis not present

## 2019-05-08 DIAGNOSIS — E782 Mixed hyperlipidemia: Secondary | ICD-10-CM | POA: Diagnosis not present

## 2019-05-08 DIAGNOSIS — Z96651 Presence of right artificial knee joint: Secondary | ICD-10-CM | POA: Diagnosis not present

## 2019-05-08 DIAGNOSIS — N183 Chronic kidney disease, stage 3 (moderate): Secondary | ICD-10-CM | POA: Diagnosis not present

## 2019-05-08 DIAGNOSIS — M24561 Contracture, right knee: Secondary | ICD-10-CM | POA: Diagnosis not present

## 2019-05-08 DIAGNOSIS — Z7902 Long term (current) use of antithrombotics/antiplatelets: Secondary | ICD-10-CM | POA: Diagnosis not present

## 2019-05-08 DIAGNOSIS — I739 Peripheral vascular disease, unspecified: Secondary | ICD-10-CM | POA: Diagnosis not present

## 2019-05-08 DIAGNOSIS — Z955 Presence of coronary angioplasty implant and graft: Secondary | ICD-10-CM | POA: Diagnosis not present

## 2019-05-08 DIAGNOSIS — I350 Nonrheumatic aortic (valve) stenosis: Secondary | ICD-10-CM | POA: Diagnosis not present

## 2019-05-08 DIAGNOSIS — Z7982 Long term (current) use of aspirin: Secondary | ICD-10-CM | POA: Diagnosis not present

## 2019-05-08 DIAGNOSIS — Z95820 Peripheral vascular angioplasty status with implants and grafts: Secondary | ICD-10-CM | POA: Diagnosis not present

## 2019-05-08 LAB — CBC
HCT: 27.6 % — ABNORMAL LOW (ref 39.0–52.0)
Hemoglobin: 8.6 g/dL — ABNORMAL LOW (ref 13.0–17.0)
MCH: 29.1 pg (ref 26.0–34.0)
MCHC: 31.2 g/dL (ref 30.0–36.0)
MCV: 93.2 fL (ref 80.0–100.0)
Platelets: 186 10*3/uL (ref 150–400)
RBC: 2.96 MIL/uL — ABNORMAL LOW (ref 4.22–5.81)
RDW: 13 % (ref 11.5–15.5)
WBC: 13.3 10*3/uL — ABNORMAL HIGH (ref 4.0–10.5)
nRBC: 0 % (ref 0.0–0.2)

## 2019-05-08 LAB — BASIC METABOLIC PANEL
Anion gap: 7 (ref 5–15)
BUN: 20 mg/dL (ref 8–23)
CO2: 24 mmol/L (ref 22–32)
Calcium: 9.3 mg/dL (ref 8.9–10.3)
Chloride: 104 mmol/L (ref 98–111)
Creatinine, Ser: 1.32 mg/dL — ABNORMAL HIGH (ref 0.61–1.24)
GFR calc Af Amer: >60 mL/min (ref >60)
GFR calc non Af Amer: 53 mL/min — ABNORMAL LOW (ref >60)
Glucose, Bld: 102 mg/dL — ABNORMAL HIGH (ref 70–99)
Potassium: 4.2 mmol/L (ref 3.5–5.1)
Sodium: 135 mmol/L (ref 135–145)

## 2019-05-08 LAB — TROPONIN I: Troponin I: 0.04 ng/mL (ref <0.03)

## 2019-05-08 MED ORDER — CARVEDILOL 3.125 MG PO TABS: 3.1250 mg | ORAL_TABLET | Freq: Two times a day (BID) | ORAL | Status: DC

## 2019-05-08 MED ORDER — FERROUS SULFATE 325 (65 FE) MG PO TABS: 325.0000 mg | ORAL_TABLET | Freq: Two times a day (BID) | ORAL | 3 refills | Status: DC

## 2019-05-08 MED ORDER — CLOPIDOGREL BISULFATE 75 MG PO TABS
75.0000 mg | ORAL_TABLET | Freq: Every day | ORAL | Status: DC
  Administered 2019-05-08 – 2019-05-09 (×2): 75 mg via ORAL
  Filled 2019-05-08 (×2): qty 1

## 2019-05-08 MED ORDER — OXYCODONE HCL 5 MG PO TABS: 5.0000 mg | ORAL_TABLET | Freq: Four times a day (QID) | ORAL | 0 refills | Status: DC | PRN

## 2019-05-08 MED ORDER — TIZANIDINE HCL 4 MG PO TABS: 4.0000 mg | ORAL_TABLET | Freq: Four times a day (QID) | ORAL | 1 refills | Status: DC | PRN

## 2019-05-08 NOTE — Progress Notes (Signed)
Orthostatics:     05/08/19 1020 05/08/19 1024 05/08/19 1025  Vitals  BP 113/68 (!) 92/50  --   MAP (mmHg) 81 (!) 61  --   BP Location Right Arm Right Arm  --   BP Method Automatic Automatic  --   Patient Position (if appropriate) Sitting Standing  --   Pulse Rate (!) 102 (!) 125  --   Pulse Rate Source Monitor Monitor  --   Resp 18 18  --   Oxygen Therapy  SpO2 90 % (!) 82 % 90 %  O2 Device Room [place]

## 2019-05-08 NOTE — Progress Notes (Signed)
Patient experiencing episodes of dizziness when ambulating with PT. Due to this the patient has agreed to stay one more night for observation. Ortho PA Amber notified and okay with the decision. Will continue to monitor.

## 2019-05-08 NOTE — Progress Notes (Signed)
PROGRESS NOTE    Joshua Wyatt  FIE:[id number] DOB: 01-04-1946 DOA: 05/06/2019 PCP: Joshua Gravel, MD   Brief Narrative:  Joshua Katz. Wyatt is a 73 year old pleasant gentleman with a history of hypertension, hyperlipidemia, GERD, CAD status post stent placement, PVD and CKD stage III who was admitted by orthopedics and underwent right knee replacement by Ortho.  He had chest pain the night before which lasted only for few minutes so hospital service was consulted, EKG was done and troponins were followed which were stable.  Patient was also seen by cardiology and has been cleared for discharge.  Consultants:   Hospitalist  Procedures:   Right knee arthroplasty  Antimicrobials:   None   Subjective: Patient seen and examined.  He has no complaints.  No more chest pain.  He is ready to go home.  Objective: Vitals:   05/08/19 0616 05/08/19 1020 05/08/19 1024 05/08/19 1025  BP: 134/75 113/68 (!) 92/50   Pulse: 100 (!) 102 (!) 125   Resp: $Remo'12 18 18   'TrwTY$ Temp: 100.3 F (37.9 C)     TempSrc: Oral     SpO2: 92% 90% (!) 82% 90%  Weight:      Height:        Intake/Output Summary (Last 24 hours) at 05/08/2019 1243 Last data filed at 05/08/2019 0555 Gross per 24 hour  Intake 2576.67 ml  Output 3810 ml  Net -1233.33 ml   Filed Weights   05/06/19 0526  Weight: 73.9 kg    Examination:  General exam: Appears calm and comfortable  Respiratory system: Clear to auscultation. Respiratory effort normal. Cardiovascular system: S1 & S2 heard, RRR. No JVD, murmurs, rubs, gallops or clicks. No pedal edema. Gastrointestinal system: Abdomen is nondistended, soft and nontender. No organomegaly or masses felt. Normal bowel sounds heard. Central nervous system: Alert and oriented. No focal neurological deficits. Extremities: Symmetric 5 x 5 power. Skin: No rashes, lesions or ulcers Psychiatry: Judgement and insight appear normal. Mood & affect appropriate.   Data Reviewed: I have personally  reviewed following labs and imaging studies  CBC: Recent Labs  Lab 05/07/19 0427 05/08/19 0522  WBC 14.6* 13.3*  HGB 9.0* 8.6*  HCT 27.8* 27.6*  MCV 92.1 93.2  PLT 167 166   Basic Metabolic Panel: Recent Labs  Lab 05/07/19 0427 05/08/19 0522  NA 132* 135  K 4.6 4.2  CL 102 104  CO2 19* 24  GLUCOSE 140* 102*  BUN 18 20  CREATININE 1.43* 1.32*  CALCIUM 9.2 9.3   GFR: Estimated Creatinine Clearance: 47.5 mL/min (A) (by C-G formula based on SCr of 1.32 mg/dL (H)). Liver Function Tests: No results for input(s): AST, ALT, ALKPHOS, BILITOT, PROT, ALBUMIN in the last 168 hours. No results for input(s): LIPASE, AMYLASE in the last 168 hours. No results for input(s): AMMONIA in the last 168 hours. Coagulation Profile: No results for input(s): INR, PROTIME in the last 168 hours. Cardiac Enzymes: Recent Labs  Lab 05/07/19 0427 05/07/19 1020 05/07/19 1611 05/07/19 2133 05/08/19 0904  TROPONINI <0.03 <0.03 0.04* 0.04* 0.04*   BNP (last 3 results) No results for input(s): PROBNP in the last 8760 hours. HbA1C: Recent Labs    05/07/19 0450  HGBA1C 6.2*   CBG: No results for input(s): GLUCAP in the last 168 hours. Lipid Profile: Recent Labs    05/07/19 0450  CHOL 111  HDL 37*  LDLCALC 62  TRIG 61  CHOLHDL 3.0   Thyroid Function Tests: No results for input(s): TSH, T4TOTAL,  FREET4, T3FREE, THYROIDAB in the last 72 hours. Anemia Panel: No results for input(s): VITAMINB12, FOLATE, FERRITIN, TIBC, IRON, RETICCTPCT in the last 72 hours. Sepsis Labs: No results for input(s): PROCALCITON, LATICACIDVEN in the last 168 hours.  Recent Results (from the past 240 hour(s))  Novel Coronavirus, NAA (hospital order; send-out to ref lab)     Status: None   Collection Time: 05/01/19 10:56 AM  Result Value Ref Range Status   SARS-CoV-2, NAA NOT DETECTED NOT DETECTED Final    Comment: (NOTE) Testing was performed using the cobas(R) SARS-CoV-2 test. This test was developed and  its performance characteristics determined by [employer]. This test has not been FDA cleared or approved. This test has been authorized by FDA under an Emergency Use Authorization (EUA). This test is only authorized for the duration of time the declaration that circumstances exist justifying the authorization of the emergency use of in vitro diagnostic tests for detection of SARS-CoV-2 virus and/or diagnosis of COVID-19 infection under section 564(b)(1) of the Act, 21 U.S.C. 737TGG-2(I)(9), unless the authorization is terminated or revoked sooner. When diagnostic testing is negative, the possibility of a false negative result should be considered in the context of a patient's recent exposures and the presence of clinical signs and symptoms consistent with COVID-19. An individual without symptoms of COVID-19 and who is not shedding SARS-CoV-2 virus would expect to have  a negative (not detected) result in this assay. Performed At: [facility] [address] [place], [place] [id number] Rush Farmer MD JK:[id number]    [place]  Final    Comment: Performed at [facility], [place] [address], [place], [place] [address]      Radiology Studies: Dg Chest Port 1 View  Result Date: 05/07/2019 CLINICAL DATA:  Chest pain. EXAM: PORTABLE CHEST 1 VIEW COMPARISON:  01/24/2017. FINDINGS: Mediastinum and hilar structures normal. Mild right mid lung field and left base subsegmental atelectasis. No focal alveolar infiltrate. No pleural effusion or pneumothorax. Heart size normal. Thoracic spine scoliosis and degenerative change. Degenerative changes both shoulders. Diffuse osteopenia. Surgical clips and a stent noted over the right neck. Left carotid calcification noted. IMPRESSION: 1. Mild right mid lung field and left base subsegmental atelectasis. No acute infiltrates. Heart size normal. 2. Surgical clips and a vascular stent noted over the right neck.  Left carotid calcification consistent atherosclerotic vascular disease. Electronically Signed   By: Henderson   On: 05/07/2019 06:12    Scheduled Meds: . amLODipine  10 mg Oral Daily  . aspirin EC  81 mg Oral Daily  . atorvastatin  40 mg Oral QPM  . clopidogrel  75 mg Oral Daily  . docusate sodium  100 mg Oral BID  . ezetimibe  10 mg Oral Daily  . ferrous sulfate  325 mg Oral TID PC  . irbesartan  300 mg Oral Daily  . pantoprazole  40 mg Oral Q1200  . tamsulosin  0.4 mg Oral QODAY   Continuous Infusions: . lactated ringers Stopped (05/06/19 1215)  . lactated ringers 10 mL/hr at 05/07/19 1922  . methocarbamol (ROBAXIN) IV 500 mg (05/06/19 1016)     LOS: 1 day   Assessment & Plan:   Principal Problem:   H/O total knee replacement, right Active Problems:   Mixed hyperlipidemia   HTN (hypertension)   PAD (peripheral artery disease) (HCC)   CAD (coronary artery disease)   Chest pain   GERD (gastroesophageal reflux disease)  Right knee osteoarthritis: Status post total knee  replacement.  Management per primary service/orthopedics.  History of CAD with chest pain: His pain resolved.  Troponins negative so far.  EKG with no acute ST-T wave changes.  Cardiology has cleared the patient.  Hypertension: Controlled.  Continue amlodipine and irbesartan as well as as needed hydralazine.  GERD: Continue PPI.  DVT prophylaxis: Per primary service Code Status: Full code Family Communication: Discussed with patient Disposition Plan: Per primary service   Time spent: 20 minutes   Darliss Cheney, MD [facility] Pager [phone]  If 7PM-7AM, please contact night-coverage [email or web address] Password [facility] 05/08/2019, 12:43 PM

## 2019-05-08 NOTE — Progress Notes (Signed)
Physical Therapy Treatment Patient Details Name: Joshua Wyatt MRN: [id number] DOB: September 02, 1946 Today's Date: 05/08/2019    History of Present Illness 73 yo male s/p R TKR on 05/06/19. PMH includes CAD s/p coronary artery stent placement, second degree AV block, PAD, CKD, bilateral carotid artery disease, HLD, LE stent placement.     PT Comments    Pt eager to mobilize however requiring more assist for mobility today due to weakness and posterior bias.  Pt with perspirations and nausea during ambulation so recliner provided and RN notified.  Pt does not appear ready for d/c home today.    Follow Up Recommendations  Follow surgeon's recommendation for DC plan and follow-up therapies;Supervision for mobility/OOB(HHPT)     Equipment Recommendations  Rolling walker with 5" wheels    Recommendations for Other Services       Precautions / Restrictions Precautions Precautions: Knee;Fall Required Braces or Orthoses: Knee Immobilizer - Right Knee Immobilizer - Right: Discontinue once straight leg raise with < 10 degree lag Restrictions Weight Bearing Restrictions: No RLE Weight Bearing: Weight bearing as tolerated Other Position/Activity Restrictions: WBAT    Mobility  Bed Mobility Overal bed mobility: Needs Assistance Bed Mobility: Supine to Sit     Supine to sit: Min assist     General bed mobility comments: verbal cues for self assist, assist for trunk, posterior trunk lean this morning  Transfers Overall transfer level: Needs assistance Equipment used: Rolling walker (2 wheeled) Transfers: Sit to/from Stand Sit to Stand: Mod assist;Min assist         General transfer comment: verbal cues for UE and LE positioning, assist to rise, steady and control descent; pt with posterior LOB when upright and assisted with return to sitting and performed again  Ambulation/Gait Ambulation/Gait assistance: Min assist Gait Distance (Feet): 9 Feet Assistive device: Rolling walker (2  wheeled) Gait Pattern/deviations: Step-to pattern;Decreased stride length;Trunk flexed;Antalgic     General Gait Details: verbal cues for sequence, RW positioning, step length, posture; limited distance due to perspiration and nausea, recliner provided; vitals: 102/56 mmHg, 93% room air, 101 bpm   Stairs             Wheelchair Mobility    Modified Rankin (Stroke Patients Only)       Balance                                            Cognition Arousal/Alertness: Awake/alert Behavior During Therapy: WFL for tasks assessed/performed Overall Cognitive Status: Within Functional Limits for tasks assessed                                        Exercises      General Comments        Pertinent Vitals/Pain Pain Assessment: 0-10 Pain Score: 5  Pain Location: R knee  Pain Descriptors / Indicators: Aching;Sore Pain Intervention(s): Monitored during session;Repositioned;Limited activity within patient's tolerance;Ice applied    Home Living                      Prior Function            PT Goals (current goals can now be found in the care plan section) Progress towards PT goals: Progressing toward goals    Frequency  7X/week      PT Plan Current plan remains appropriate    Co-evaluation              AM-PAC PT "6 Clicks" Mobility   Outcome Measure  Help needed turning from your back to your side while in a flat bed without using bedrails?: A Little Help needed moving from lying on your back to sitting on the side of a flat bed without using bedrails?: A Lot Help needed moving to and from a bed to a chair (including a wheelchair)?: A Lot Help needed standing up from a chair using your arms (e.g., wheelchair or bedside chair)?: A Lot Help needed to walk in hospital room?: A Little Help needed climbing 3-5 steps with a railing? : A Lot 6 Click Score: 14    End of Session Equipment Utilized During  Treatment: Right knee immobilizer;Gait belt Activity Tolerance: Treatment limited secondary to medical complications (Comment) Patient left: with call bell/phone within reach;in chair;with chair alarm set(cardiology PA)   PT Visit Diagnosis: Other abnormalities of gait and mobility (R26.89)     Time: 4388-8757 PT Time Calculation (min) (ACUTE ONLY): 21 min  Charges:  $Gait Training: 8-22 mins                     Carmelia Bake, PT, DPT Acute Rehabilitation Services Office: [phone] Pager: [phone]  Trena Platt 05/08/2019, 12:38 PM

## 2019-05-08 NOTE — Progress Notes (Signed)
Progress Note  Patient Name: Joshua Wyatt Date of Encounter: 05/08/2019  Primary Cardiologist: [name], MD   Subjective   Patient seen just after PT attempted to walk with him. He reported weakness, diaphoresis, and nausea shortly after standing. His BP was 102/56 and he was tachycardic to the 110s. He denies any more chest pain, SOB, or palpitations. He denies dizziness, lightheadedness, or syncope.   Inpatient Medications    Scheduled Meds: . amLODipine  10 mg Oral Daily  . aspirin EC  81 mg Oral Daily  . atorvastatin  40 mg Oral QPM  . docusate sodium  100 mg Oral BID  . ezetimibe  10 mg Oral Daily  . ferrous sulfate  325 mg Oral TID PC  . irbesartan  300 mg Oral Daily  . pantoprazole  40 mg Oral Q1200  . rivaroxaban  10 mg Oral Q breakfast  . tamsulosin  0.4 mg Oral QODAY   Continuous Infusions: . lactated ringers Stopped (05/06/19 1215)  . lactated ringers 10 mL/hr at 05/07/19 1922  . methocarbamol (ROBAXIN) IV 500 mg (05/06/19 1016)   PRN Meds: acetaminophen, alum & mag hydroxide-simeth, bisacodyl, hydrALAZINE, HYDROmorphone (DILAUDID) injection, menthol-cetylpyridinium **OR** phenol, methocarbamol **OR** methocarbamol (ROBAXIN) IV, metoCLOPramide **OR** metoCLOPramide (REGLAN) injection, nitroGLYCERIN, ondansetron **OR** ondansetron (ZOFRAN) IV, oxyCODONE, oxyCODONE, polyethylene glycol, sodium phosphate   Vital Signs    Vitals:   05/07/19 1900 05/07/19 2139 05/08/19 0040 05/08/19 0616  BP: (!) 161/78 (!) 166/82 137/75 134/75  Pulse: 87 90 99 100  Resp: $Remo'16  12 12  'LyZau$ Temp: 98.5 F (36.9 C)  99.2 F (37.3 C) 100.3 F (37.9 C)  TempSrc: Oral  Oral Oral  SpO2: 96%  93% 92%  Weight:      Height:        Intake/Output Summary (Last 24 hours) at 05/08/2019 0741 Last data filed at 05/08/2019 0555 Gross per 24 hour  Intake 2816.67 ml  Output 3985 ml  Net -1168.33 ml   Filed Weights   05/06/19 0526  Weight: 73.9 kg    Telemetry    Sinus rhythm with  frequent PACs and intermittent tachycardia - Personally Reviewed   Physical Exam   GEN: sitting upright in bedside chair with wash cloth on forehead; hair is damp from sweat   Neck: No JVD, no carotid bruits Cardiac: RRR, +murmur, no rubs or gallops.  Respiratory: Clear to auscultation bilaterally, no wheezes/ rales/ rhonchi GI: NABS, Soft, nontender, non-distended  MS: No edema; No deformity. Neuro:  Nonfocal, moving all extremities spontaneously Psych: Normal affect   Labs    Chemistry Recent Labs  Lab 05/07/19 0427 05/08/19 0522  NA 132* 135  K 4.6 4.2  CL 102 104  CO2 19* 24  GLUCOSE 140* 102*  BUN 18 20  CREATININE 1.43* 1.32*  CALCIUM 9.2 9.3  GFRNONAA 48* 53*  GFRAA 56* >60  ANIONGAP 11 7     Hematology Recent Labs  Lab 05/07/19 0427 05/08/19 0522  WBC 14.6* 13.3*  RBC 3.02* 2.96*  HGB 9.0* 8.6*  HCT 27.8* 27.6*  MCV 92.1 93.2  MCH 29.8 29.1  MCHC 32.4 31.2  RDW 12.4 13.0  PLT 167 186    Cardiac Enzymes Recent Labs  Lab 05/07/19 0427 05/07/19 1020 05/07/19 1611 05/07/19 2133  TROPONINI <0.03 <0.03 0.04* 0.04*   No results for input(s): TROPIPOC in the last 168 hours.   BNPNo results for input(s): BNP, PROBNP in the last 168 hours.   DDimer No results for  input(s): DDIMER in the last 168 hours.   Radiology    Dg Chest Port 1 View  Result Date: 05/07/2019 CLINICAL DATA:  Chest pain. EXAM: PORTABLE CHEST 1 VIEW COMPARISON:  01/24/2017. FINDINGS: Mediastinum and hilar structures normal. Mild right mid lung field and left base subsegmental atelectasis. No focal alveolar infiltrate. No pleural effusion or pneumothorax. Heart size normal. Thoracic spine scoliosis and degenerative change. Degenerative changes both shoulders. Diffuse osteopenia. Surgical clips and a stent noted over the right neck. Left carotid calcification noted. IMPRESSION: 1. Mild right mid lung field and left base subsegmental atelectasis. No acute infiltrates. Heart size normal.  2. Surgical clips and a vascular stent noted over the right neck. Left carotid calcification consistent atherosclerotic vascular disease. Electronically Signed   By: Marcello Moores  Register   On: 05/07/2019 06:12    Cardiac Studies   Echocardiogram 05/07/2019: IMPRESSIONS    1. The left ventricle has hyperdynamic systolic function, with an ejection fraction of >65%. The cavity size was normal. Left ventricular diastolic Doppler parameters are consistent with pseudonormalization. No evidence of left ventricular regional wall  motion abnormalities.  2. The right ventricle has normal systolic function. The cavity was normal. There is no increase in right ventricular wall thickness.  3. Left atrial size was mildly dilated.  4. Mild thickening of the mitral valve leaflet. Mild calcification of the mitral valve leaflet.  5. The aortic valve is tricuspid. Moderate thickening of the aortic valve. Moderate calcification of the aortic valve. Mild-moderate stenosis of the aortic valve.  Patient Profile     73 y.o. male with a PMH of CAD s/p PCI/DES to RCA 03/2018, HTN, HLD, PVD s/p bilateral SFA stenting , renal artery stenosis s/p stenting, carotid artery disease s/p right CEA, CKD stage 3, who presented for right knee replacement (post-op day 1) who is being followed by cardiology for the evaluation of chest pain   Assessment & Plan    1. Chest pain in patient with CAD s/p PCI/DES to RCA 03/2018: patient reported chest pain shortly after right total knee replacement 05/06/2019. Improved with belching and temporarily relieved with SL nitro. Trops 0.03>0.03>0.04>0.04. EKG non-ischemic. Echo 05/07/2019 with EF >65% and no RWMA. Trop trend not consistent with ACS and likely demand ischemia in the setting of recent surgery. Suspect GI etiology of epigastric/lower chest discomfort - No further ischemic evaluation at this time.  - Continue aspirin, plavix, and statin - Continue amlodipine  2. S/P right knee  replacement: post-op day 2. He reports good pain control.  - Continue management per ortho  3. HTN: BP stable this morning. He received $RemoveBef'10mg'YkByCKmHlJ$  IV hydralazine at 9:42pm for elevated BP. After standing up with PT this morning his BP was 102/56 and he reported weakness, diaphoresis, and nausea - suspect this may be orthostatic hypotension - Check orthostatic vitals - Continue amlodipine and irbesartan   4. HLD: LDL 62 on labs this AM - at goal of <70 - Continue atorvastatin and zetia  5. PVD: bilateral SFA disease s/p PCI/DES with repeat PCI to L SFA 05/2018. No claudication complaints - Continue aspirin and statin - Restart plavix when cleared to do so by ortho  6. Carotid artery disease: s/p right CEA with patent stent and 1-39% stenosis in the left carotid artery on duplex 12/2018 - Continue routine monitoring outpatient  7. Aortic Stenosis: mild to moderate on echo this admission - Continue routine outpatient monitoring  Will arrange an appointment with Dr. Gloriajean Dell for post-hospital follow-up   For  questions or updates, please contact [place] Please consult [email or web address] for contact info under Cardiology/STEMI.      Signed, Abigail Butts, PA-C  05/08/2019, 7:41 AM   [phone]

## 2019-05-08 NOTE — Progress Notes (Addendum)
Subjective: 2 Days Post-Op Procedure(s) (LRB): TOTAL KNEE ARTHROPLASTY (Right) Patient reports pain as mild.   Patient seen in rounds for Dr. Gladstone Lighter. Patient is fair this morning. Denies SOB and chest pain. Reports desire to go home today. Has some pain in the right knee but he was able to walk 40 feet with PT yesterday. Appears stable from cardio standpoint this morning. Voiding well. Positive flatus.  Plan is to go Home after hospital stay.  Objective: Vital signs in last 24 hours: Temp:  [97.5 F (36.4 C)-100.3 F (37.9 C)] 100.3 F (37.9 C) (05/29 0616) Pulse Rate:  [82-100] 100 (05/29 0616) Resp:  [12-20] 12 (05/29 0616) BP: (134-166)/(67-82) 134/75 (05/29 0616) SpO2:  [92 %-100 %] 92 % (05/29 0616)  Intake/Output from previous day:  Intake/Output Summary (Last 24 hours) at 05/08/2019 0749 Last data filed at 05/08/2019 0555 Gross per 24 hour  Intake 2816.67 ml  Output 3985 ml  Net -1168.33 ml     Labs: Recent Labs    05/07/19 0427 05/08/19 0522  HGB 9.0* 8.6*   Recent Labs    05/07/19 0427 05/08/19 0522  WBC 14.6* 13.3*  RBC 3.02* 2.96*  HCT 27.8* 27.6*  PLT 167 186   Recent Labs    05/07/19 0427 05/08/19 0522  NA 132* 135  K 4.6 4.2  CL 102 104  CO2 19* 24  BUN 18 20  CREATININE 1.43* 1.32*  GLUCOSE 140* 102*  CALCIUM 9.2 9.3    EXAM General - Patient is Alert and Oriented Extremity - Neurologically intact Intact pulses distally Dorsiflexion/Plantar flexion intact No cellulitis present Compartment soft Dressing/Incision - clean, dry, no drainage Motor Function - intact, moving foot and toes well on exam.   Past Medical History:  Diagnosis Date  . Arthritis    "LITTLE IN MY LEFT HAND" (05/12/2018)  . Bilateral calf pain 03-02-2013   lower ext.doppler-mild arterial insufficiency to lower ext. this is a disease in val  . CAD (coronary artery disease)    a. cath 02/24/18 - 90% ost RCA; 40% ost & pro LAD  . Cerebral atherosclerosis  03-02-2013   carotid duplex- normal study  . Deviated septum    RIGHT  . GERD (gastroesophageal reflux disease)   . History of echocardiogram 08-02-2005   normal study   . History of kidney stones   . Hyperlipemia   . Hypertension 03-30-2013   PV Angiogram- rt. renal artery stent was widely open,50-60% lt. renal artery stenosis,lt. SFA stents patent with high grade above the knee  poplital stenosis  . Hyponatremia 02/03/2017  . Normal cardiac stress test 03-25-2013   EF 60%, normal stress test ,this is a presurgical  test  . Peripheral vascular disease ([place])   . PVD (peripheral vascular disease) ([place])    A. 2009: S/P PTA/stent to D SFA. B. 01/2017: angiosculpt atherectomy/drug-eluting balloon angioplasty to L SFA.  [name] Renal artery stenosis ([place]) 03-02-2013   renal artery doppler-this was an abnormal doppler    Assessment/Plan: 2 Days Post-Op Procedure(s) (LRB): TOTAL KNEE ARTHROPLASTY (Right) Principal Problem:   H/O total knee replacement, right Active Problems:   Mixed hyperlipidemia   HTN (hypertension)   PAD (peripheral artery disease) (HCC)   CAD (coronary artery disease)   Chest pain   GERD (gastroesophageal reflux disease)  Estimated body mass index is 24.77 kg/m as calculated from the following:   Height as of this encounter: $RemoveBeforeD'5\' 8"'[id number]$  (1.727 m).   Weight as of this encounter: 73.9 kg. Advance  diet Up with therapy Discharge home with home health when medically stable  DVT Prophylaxis - resume Plavix Weight-Bearing as tolerated   We will continue with therapy today. If he is meeting goals and stable from medical standpoint, cleared for discharge by cardiology, plan for DC home with HHPT. Need clarification from cardiology regarding need for Coreg at home. Follow up in office in 2 weeks. Discharge instructions given.   [place], [facility] 05/08/2019, 7:49 AM

## 2019-05-08 NOTE — Progress Notes (Signed)
Physical Therapy Treatment Patient Details Name: Joshua Wyatt MRN: [id number] DOB: [birth year] Today's Date: 05/08/2019    History of Present Illness 73 yo male s/p R TKR on 05/06/19. PMH includes CAD s/p coronary artery stent placement, second degree AV block, PAD, CKD, bilateral carotid artery disease, HLD, LE stent placement.     PT Comments    Pt reports feeling better than this morning and eager to practice steps in anticipation of d/c today.  However, pt requiring min assist for transfers, ambulating and stair negotiation.  Pt encouraged to remain another night due to assist required and fatigue level.  Pt quickly fatigued with short distance and also felt unable to practice steps once more.  Pt reports he does not want to take "oxy" pain meds and agreeable to practice mobilizing again tomorrow. RN notified.   Follow Up Recommendations  Follow surgeon's recommendation for DC plan and follow-up therapies;Supervision for mobility/OOB(HHPT)     Equipment Recommendations  Rolling walker with 5" wheels    Recommendations for Other Services       Precautions / Restrictions Precautions Precautions: Knee;Fall Required Braces or Orthoses: Knee Immobilizer - Right Knee Immobilizer - Right: Discontinue once straight leg raise with < 10 degree lag Restrictions Other Position/Activity Restrictions: WBAT    Mobility  Bed Mobility Overal bed mobility: Needs Assistance Bed Mobility: Supine to Sit     Supine to sit: Min assist     General bed mobility comments: pt up in recliner on arrival  Transfers Overall transfer level: Needs assistance Equipment used: Rolling walker (2 wheeled) Transfers: Sit to/from Stand Sit to Stand: Min assist         General transfer comment: verbal cues for UE and LE positioning, assist to rise, steady and control descent  Ambulation/Gait Ambulation/Gait assistance: Min assist Gait Distance (Feet): 24 Feet Assistive device: Rolling walker (2  wheeled) Gait Pattern/deviations: Step-to pattern;Decreased stride length;Trunk flexed;Antalgic Gait velocity: decr    General Gait Details: verbal cues for sequence, RW positioning, step length, posture; pt unable to tolerate further distance, 3 standing rest breaks due to fatigue   Stairs Stairs: Yes Stairs assistance: Min assist Stair Management: Step to pattern;Backwards;With walker Number of Stairs: 2 General stair comments: verbal cues for sequence, RW positioning, safety; pt required min assist descending due to LOB and to avoid fall; pt felt unable to practice second time - too fatigued   Wheelchair Mobility    Modified Rankin (Stroke Patients Only)       Balance                                            Cognition Arousal/Alertness: Awake/alert Behavior During Therapy: WFL for tasks assessed/performed Overall Cognitive Status: Within Functional Limits for tasks assessed                                        Exercises      General Comments        Pertinent Vitals/Pain Pain Assessment: 0-10 Pain Score: 6  Pain Location: R knee  Pain Descriptors / Indicators: Aching;Sore Pain Intervention(s): Repositioned;Monitored during session    Home Living                      Prior Function  PT Goals (current goals can now be found in the care plan section) Progress towards PT goals: Progressing toward goals    Frequency    7X/week      PT Plan Current plan remains appropriate    Co-evaluation              AM-PAC PT "6 Clicks" Mobility   Outcome Measure  Help needed turning from your back to your side while in a flat bed without using bedrails?: A Little Help needed moving from lying on your back to sitting on the side of a flat bed without using bedrails?: A Little Help needed moving to and from a bed to a chair (including a wheelchair)?: A Little Help needed standing up from a chair  using your arms (e.g., wheelchair or bedside chair)?: A Little Help needed to walk in hospital room?: A Little Help needed climbing 3-5 steps with a railing? : A Lot 6 Click Score: 17    End of Session Equipment Utilized During Treatment: Right knee immobilizer;Gait belt Activity Tolerance: Patient limited by fatigue Patient left: in chair;with call bell/phone within reach;with chair alarm set Nurse Communication: Mobility status PT Visit Diagnosis: Other abnormalities of gait and mobility (R26.89)     Time: 1349-1405 PT Time Calculation (min) (ACUTE ONLY): 16 min  Charges:  $Gait Training: 8-22 mins                     Carmelia Bake, PT, DPT Acute Rehabilitation Services Office: [phone] Pager: [place] 05/08/2019, 4:06 PM

## 2019-05-09 DIAGNOSIS — K219 Gastro-esophageal reflux disease without esophagitis: Secondary | ICD-10-CM | POA: Diagnosis not present

## 2019-05-09 DIAGNOSIS — I350 Nonrheumatic aortic (valve) stenosis: Secondary | ICD-10-CM | POA: Diagnosis not present

## 2019-05-09 DIAGNOSIS — Z79899 Other long term (current) drug therapy: Secondary | ICD-10-CM | POA: Diagnosis not present

## 2019-05-09 DIAGNOSIS — M1711 Unilateral primary osteoarthritis, right knee: Secondary | ICD-10-CM | POA: Diagnosis not present

## 2019-05-09 DIAGNOSIS — I739 Peripheral vascular disease, unspecified: Secondary | ICD-10-CM | POA: Diagnosis not present

## 2019-05-09 DIAGNOSIS — Z955 Presence of coronary angioplasty implant and graft: Secondary | ICD-10-CM | POA: Diagnosis not present

## 2019-05-09 DIAGNOSIS — Z96651 Presence of right artificial knee joint: Secondary | ICD-10-CM | POA: Diagnosis not present

## 2019-05-09 DIAGNOSIS — Z7902 Long term (current) use of antithrombotics/antiplatelets: Secondary | ICD-10-CM | POA: Diagnosis not present

## 2019-05-09 DIAGNOSIS — I2510 Atherosclerotic heart disease of native coronary artery without angina pectoris: Secondary | ICD-10-CM | POA: Diagnosis not present

## 2019-05-09 DIAGNOSIS — Z87891 Personal history of nicotine dependence: Secondary | ICD-10-CM | POA: Diagnosis not present

## 2019-05-09 DIAGNOSIS — R079 Chest pain, unspecified: Secondary | ICD-10-CM | POA: Diagnosis not present

## 2019-05-09 DIAGNOSIS — Z7982 Long term (current) use of aspirin: Secondary | ICD-10-CM | POA: Diagnosis not present

## 2019-05-09 DIAGNOSIS — Z87442 Personal history of urinary calculi: Secondary | ICD-10-CM | POA: Diagnosis not present

## 2019-05-09 DIAGNOSIS — I129 Hypertensive chronic kidney disease with stage 1 through stage 4 chronic kidney disease, or unspecified chronic kidney disease: Secondary | ICD-10-CM | POA: Diagnosis not present

## 2019-05-09 DIAGNOSIS — E782 Mixed hyperlipidemia: Secondary | ICD-10-CM | POA: Diagnosis not present

## 2019-05-09 DIAGNOSIS — N183 Chronic kidney disease, stage 3 (moderate): Secondary | ICD-10-CM | POA: Diagnosis not present

## 2019-05-09 DIAGNOSIS — Z95820 Peripheral vascular angioplasty status with implants and grafts: Secondary | ICD-10-CM | POA: Diagnosis not present

## 2019-05-09 DIAGNOSIS — Z888 Allergy status to other drugs, medicaments and biological substances status: Secondary | ICD-10-CM | POA: Diagnosis not present

## 2019-05-09 DIAGNOSIS — M24561 Contracture, right knee: Secondary | ICD-10-CM | POA: Diagnosis not present

## 2019-05-09 LAB — CBC
HCT: 27.4 % — ABNORMAL LOW (ref 39.0–52.0)
Hemoglobin: 8.7 g/dL — ABNORMAL LOW (ref 13.0–17.0)
MCH: 29.8 pg (ref 26.0–34.0)
MCHC: 31.8 g/dL (ref 30.0–36.0)
MCV: 93.8 fL (ref 80.0–100.0)
Platelets: 178 10*3/uL (ref 150–400)
RBC: 2.92 MIL/uL — ABNORMAL LOW (ref 4.22–5.81)
RDW: 12.7 % (ref 11.5–15.5)
WBC: 15.8 10*3/uL — ABNORMAL HIGH (ref 4.0–10.5)
nRBC: 0 % (ref 0.0–0.2)

## 2019-05-09 NOTE — Progress Notes (Signed)
Physical Therapy Treatment Patient Details Name: Joshua Wyatt MRN: [id number] DOB: 06-19-1946 Today's Date: 05/09/2019    History of Present Illness 73 yo male s/p R TKR on 05/06/19. PMH includes CAD s/p coronary artery stent placement, second degree AV block, PAD, CKD, bilateral carotid artery disease, HLD, LE stent placement.     PT Comments    Pt is progressing well with mobility and is ready to DC home from PT standpoint. He ambulated 54' with RW, performed TKA HEP with supervision, and reviewed stair training. HR 140 max with walking, 115-120 at rest, pt reports this is baseline.    Follow Up Recommendations  Follow surgeon's recommendation for DC plan and follow-up therapies;Supervision for mobility/OOB(HHPT)     Equipment Recommendations  Rolling walker with 5" wheels    Recommendations for Other Services       Precautions / Restrictions Precautions Precautions: Knee;Fall Required Braces or Orthoses: Knee Immobilizer - Right Knee Immobilizer - Right: Discontinue once straight leg raise with < 10 degree lag Restrictions Weight Bearing Restrictions: No RLE Weight Bearing: Weight bearing as tolerated Other Position/Activity Restrictions: WBAT    Mobility  Bed Mobility               General bed mobility comments: pt up in recliner on arrival  Transfers Overall transfer level: Needs assistance Equipment used: Rolling walker (2 wheeled) Transfers: Sit to/from Stand Sit to Stand: Min guard         General transfer comment: verbal cues for UE and LE positioning  Ambulation/Gait Ambulation/Gait assistance: Min guard Gait Distance (Feet): 60 Feet Assistive device: Rolling walker (2 wheeled) Gait Pattern/deviations: Step-to pattern;Decreased stride length;Trunk flexed;Antalgic Gait velocity: decr    General Gait Details: VCs posture and sequencing, no loss of balance, HR 140 max with walking, distance limited by PT due to HR and due to pt  fatigue   Stairs   Stairs assistance: Min assist Stair Management: Step to pattern;Backwards;With walker Number of Stairs: 2 General stair comments: verbal cues for sequence, RW positioning, safety, handout issued for backwards stairs technique; pt stated he also has an entrance with 1 handrail so verbally discussed technique for that with [facility] and issued handout as he was too tired to attempt stairs again   Wheelchair Mobility    Modified Rankin (Stroke Patients Only)       Balance Overall balance assessment: Needs assistance Sitting-balance support: No upper extremity supported;Feet supported Sitting balance-Leahy Scale: Fair     Standing balance support: Bilateral upper extremity supported Standing balance-Leahy Scale: Poor                              Cognition Arousal/Alertness: Awake/alert Behavior During Therapy: WFL for tasks assessed/performed Overall Cognitive Status: Within Functional Limits for tasks assessed                                        Exercises Total Joint Exercises Ankle Circles/Pumps: AROM;10 reps;Both Quad Sets: AROM;Left;5 reps Short Arc Quad: 10 reps;Right;AROM Heel Slides: Right;10 reps;AAROM Hip ABduction/ADduction: AAROM;10 reps;Right Straight Leg Raises: AAROM;10 reps;Right Long Arc Quad: AROM;Right;10 reps;Seated Knee Flexion: AAROM;AROM;Right;5 reps;Seated Goniometric ROM: 5-55* AAROM R knee    General Comments        Pertinent Vitals/Pain Pain Score: 3  Pain Location: R knee  Pain Descriptors / Indicators: Aching;Sore Pain Intervention(s): Limited activity  within patient's tolerance;Monitored during session;Premedicated before session;Ice applied    Home Living                      Prior Function            PT Goals (current goals can now be found in the care plan section) Acute Rehab PT Goals Patient Stated Goal: go home to wife; play golf PT Goal Formulation: With patient Time  For Goal Achievement: 05/13/19 Potential to Achieve Goals: Good Progress towards PT goals: Progressing toward goals    Frequency    7X/week      PT Plan Current plan remains appropriate    Co-evaluation              AM-PAC PT "6 Clicks" Mobility   Outcome Measure  Help needed turning from your back to your side while in a flat bed without using bedrails?: A Little Help needed moving from lying on your back to sitting on the side of a flat bed without using bedrails?: A Little Help needed moving to and from a bed to a chair (including a wheelchair)?: A Little Help needed standing up from a chair using your arms (e.g., wheelchair or bedside chair)?: A Little Help needed to walk in hospital room?: A Little Help needed climbing 3-5 steps with a railing? : A Little 6 Click Score: 18    End of Session Equipment Utilized During Treatment: Right knee immobilizer;Gait belt Activity Tolerance: Patient limited by fatigue;Treatment limited secondary to medical complications (Comment)(HR 140 with walking) Patient left: in chair;with call bell/phone within reach Nurse Communication: Mobility status;Other (comment)(pt ready to DC) PT Visit Diagnosis: Other abnormalities of gait and mobility (R26.89)     Time: 4680-3212 PT Time Calculation (min) (ACUTE ONLY): 49 min  Charges:  $Gait Training: 8-22 mins $Therapeutic Exercise: 8-22 mins $Therapeutic Activity: 8-22 mins                    Blondell Reveal Kistler PT 05/09/2019  Acute Rehabilitation Services Pager [phone] Office [phone]

## 2019-05-09 NOTE — Progress Notes (Signed)
Joshua Wyatt  MRN: [id number] DOB/Age: January 25, 1946 73 y.o. Physician: Joshua Wyatt, M.D. 3 Days Post-Op Procedure(s) (LRB): TOTAL KNEE ARTHROPLASTY (Right)  Subjective: Patient sitting in bedside chair, resting comfortably, reports minimal knee pain. Vital Signs Temp:  [97.8 F (36.6 C)-99 F (37.2 C)] 97.8 F (36.6 C) (05/30 0544) Pulse Rate:  [65-125] 65 (05/30 0544) Resp:  [16-18] 16 (05/30 0544) BP: (92-145)/(50-68) 126/67 (05/30 0544) SpO2:  [82 %-98 %] 98 % (05/30 0544)  Lab Results Recent Labs    05/08/19 0522 05/09/19 0522  WBC 13.3* 15.8*  HGB 8.6* 8.7*  HCT 27.6* 27.4*  PLT 186 178   BMET Recent Labs    05/07/19 0427 05/08/19 0522  NA 132* 135  K 4.6 4.2  CL 102 104  CO2 19* 24  GLUCOSE 140* 102*  BUN 18 20  CREATININE 1.43* 1.32*  CALCIUM 9.2 9.3   INR  Date Value Ref Range Status  04/24/2019 1.0 0.8 - 1.2 Final    Comment:    (NOTE) INR goal varies based on device and disease states. Performed at [facility], [place] [address] [place], [place] [address]      Exam  Right knee dressing is dry with a small approximately 2 cm area of dried blood over the proximal third of the dressing.  Compartments are soft.  Good ankle motion.  Neurovascular intact distally.  Plan Discharge home today.  Hospitalist have cleared patient from a cardiac standpoint.  Continue routine postop TKA protocol.  Follow-up with Dr. Regan Lemming M Meklit Wyatt 05/09/2019, 9:20 AM    Contact # [phone]

## 2019-05-09 NOTE — Progress Notes (Signed)
PROGRESS NOTE    [name]  CLE:[id number] DOB: Jan 21, 1946 DOA: 05/06/2019 PCP: Jani Gravel, MD   Brief Narrative:  Joshua Wyatt is a 73 year old pleasant gentleman with a history of hypertension, hyperlipidemia, GERD, CAD status post stent placement, PVD and CKD stage III who was admitted by orthopedics and underwent right knee replacement by Ortho.  He had chest pain the night before which lasted only for few minutes so hospital service was consulted, EKG was done and troponins were followed which were stable.  Patient was also seen by cardiology and has been cleared for discharge.  He was supposed to be discharged yesterday however he felt dizzy working with physical therapy so discharge was held.  Consultants:   Hospitalist  Procedures:   Right knee arthroplasty  Antimicrobials:   None   Subjective: Patient seen and examined.  Once again he has no complaints.  No more chest pain.  Objective: Vitals:   05/08/19 1025 05/08/19 1330 05/08/19 2014 05/09/19 0544  BP:  119/62 (!) 145/65 126/67  Pulse:  (!) 110 100 65  Resp:  $Remo'18 18 16  'Pgdqc$ Temp:  99 F (37.2 C) 99 F (37.2 C) 97.8 F (36.6 C)  TempSrc:  Oral Oral Oral  SpO2: 90% 94% 97% 98%  Weight:      Height:        Intake/Output Summary (Last 24 hours) at 05/09/2019 0909 Last data filed at 05/09/2019 0544 Gross per 24 hour  Intake 240 ml  Output 1850 ml  Net -1610 ml   Filed Weights   05/06/19 0526  Weight: 73.9 kg    Examination:  General exam: Appears calm and comfortable  Respiratory system: Clear to auscultation. Respiratory effort normal. Cardiovascular system: S1 & S2 heard, RRR. No JVD, murmurs, rubs, gallops or clicks. No pedal edema. Gastrointestinal system: Abdomen is nondistended, soft and nontender. No organomegaly or masses felt. Normal bowel sounds heard. Central nervous system: Alert and oriented. No focal neurological deficits. Extremities: Symmetric 5 x 5 power. Skin: No rashes, lesions  or ulcers Psychiatry: Judgement and insight appear normal. Mood & affect appropriate.  Data Reviewed: I have personally reviewed following labs and imaging studies  CBC: Recent Labs  Lab 05/07/19 0427 05/08/19 0522 05/09/19 0522  WBC 14.6* 13.3* 15.8*  HGB 9.0* 8.6* 8.7*  HCT 27.8* 27.6* 27.4*  MCV 92.1 93.2 93.8  PLT 167 186 944   Basic Metabolic Panel: Recent Labs  Lab 05/07/19 0427 05/08/19 0522  NA 132* 135  K 4.6 4.2  CL 102 104  CO2 19* 24  GLUCOSE 140* 102*  BUN 18 20  CREATININE 1.43* 1.32*  CALCIUM 9.2 9.3   GFR: Estimated Creatinine Clearance: 47.5 mL/min (A) (by C-G formula based on SCr of 1.32 mg/dL (H)). Liver Function Tests: No results for input(s): AST, ALT, ALKPHOS, BILITOT, PROT, ALBUMIN in the last 168 hours. No results for input(s): LIPASE, AMYLASE in the last 168 hours. No results for input(s): AMMONIA in the last 168 hours. Coagulation Profile: No results for input(s): INR, PROTIME in the last 168 hours. Cardiac Enzymes: Recent Labs  Lab 05/07/19 0427 05/07/19 1020 05/07/19 1611 05/07/19 2133 05/08/19 0904  TROPONINI <0.03 <0.03 0.04* 0.04* 0.04*   BNP (last 3 results) No results for input(s): PROBNP in the last 8760 hours. HbA1C: Recent Labs    05/07/19 0450  HGBA1C 6.2*   CBG: No results for input(s): GLUCAP in the last 168 hours. Lipid Profile: Recent Labs    05/07/19 0450  CHOL 111  HDL 37*  LDLCALC 62  TRIG 61  CHOLHDL 3.0   Thyroid Function Tests: No results for input(s): TSH, T4TOTAL, FREET4, T3FREE, THYROIDAB in the last 72 hours. Anemia Panel: No results for input(s): VITAMINB12, FOLATE, FERRITIN, TIBC, IRON, RETICCTPCT in the last 72 hours. Sepsis Labs: No results for input(s): PROCALCITON, LATICACIDVEN in the last 168 hours.  Recent Results (from the past 240 hour(s))  Novel Coronavirus, NAA (hospital order; send-out to ref lab)     Status: None   Collection Time: 05/01/19 10:56 AM  Result Value Ref Range  Status   SARS-CoV-2, NAA NOT DETECTED NOT DETECTED Final    Comment: (NOTE) Testing was performed using the cobas(R) SARS-CoV-2 test. This test was developed and its performance characteristics determined by [employer]. This test has not been FDA cleared or approved. This test has been authorized by FDA under an Emergency Use Authorization (EUA). This test is only authorized for the duration of time the declaration that circumstances exist justifying the authorization of the emergency use of in vitro diagnostic tests for detection of SARS-CoV-2 virus and/or diagnosis of COVID-19 infection under section 564(b)(1) of the Act, 21 U.S.C. 654YTK-3(T)(4), unless the authorization is terminated or revoked sooner. When diagnostic testing is negative, the possibility of a false negative result should be considered in the context of a patient's recent exposures and the presence of clinical signs and symptoms consistent with COVID-19. An individual without symptoms of COVID-19 and who is not shedding SARS-CoV-2 virus would expect to have  a negative (not detected) result in this assay. Performed At: [facility] [address] [place], [place] [id number] Rush Farmer MD ZG:[id number]    [place]  Final    Comment: Performed at [facility], [place] [address], [place], [place] [address]      Radiology Studies: No results found.  Scheduled Meds: . amLODipine  10 mg Oral Daily  . aspirin EC  81 mg Oral Daily  . atorvastatin  40 mg Oral QPM  . clopidogrel  75 mg Oral Daily  . docusate sodium  100 mg Oral BID  . ezetimibe  10 mg Oral Daily  . ferrous sulfate  325 mg Oral TID PC  . irbesartan  300 mg Oral Daily  . pantoprazole  40 mg Oral Q1200  . tamsulosin  0.4 mg Oral QODAY   Continuous Infusions: . lactated ringers Stopped (05/06/19 1215)  . lactated ringers 10 mL/hr at 05/07/19 1922  . methocarbamol (ROBAXIN) IV 500 mg (05/06/19 1016)      LOS: 1 day   Assessment & Plan:   Principal Problem:   H/O total knee replacement, right Active Problems:   Mixed hyperlipidemia   HTN (hypertension)   PAD (peripheral artery disease) (HCC)   CAD (coronary artery disease)   Chest pain   GERD (gastroesophageal reflux disease)  Right knee osteoarthritis: Status post total knee replacement.  Management per primary service/orthopedics.  History of CAD with chest pain: His pain resolved.  Troponins negative so far.  EKG with no acute ST-T wave changes.  Cardiology has cleared the patient.  Hypertension: Controlled.  Continue amlodipine and irbesartan as well as as needed hydralazine.  GERD: Continue PPI.  DVT prophylaxis: Per primary service Code Status: Full code Family Communication: Discussed with patient Disposition Plan: Per primary service   Time spent: 18 minutes   Darliss Cheney, MD [facility] Pager [phone]  If 7PM-7AM, please contact night-coverage [email or web address] Password [email or web address] 05/09/2019, 9:09 AM

## 2019-05-10 DIAGNOSIS — Z96651 Presence of right artificial knee joint: Secondary | ICD-10-CM | POA: Diagnosis not present

## 2019-05-10 DIAGNOSIS — I2510 Atherosclerotic heart disease of native coronary artery without angina pectoris: Secondary | ICD-10-CM | POA: Diagnosis not present

## 2019-05-10 DIAGNOSIS — E782 Mixed hyperlipidemia: Secondary | ICD-10-CM | POA: Diagnosis not present

## 2019-05-10 DIAGNOSIS — I739 Peripheral vascular disease, unspecified: Secondary | ICD-10-CM | POA: Diagnosis not present

## 2019-05-10 DIAGNOSIS — I6523 Occlusion and stenosis of bilateral carotid arteries: Secondary | ICD-10-CM | POA: Diagnosis not present

## 2019-05-10 DIAGNOSIS — N183 Chronic kidney disease, stage 3 (moderate): Secondary | ICD-10-CM | POA: Diagnosis not present

## 2019-05-10 DIAGNOSIS — Z87891 Personal history of nicotine dependence: Secondary | ICD-10-CM | POA: Diagnosis not present

## 2019-05-10 DIAGNOSIS — I441 Atrioventricular block, second degree: Secondary | ICD-10-CM | POA: Diagnosis not present

## 2019-05-10 DIAGNOSIS — Z9181 History of falling: Secondary | ICD-10-CM | POA: Diagnosis not present

## 2019-05-10 DIAGNOSIS — Z7982 Long term (current) use of aspirin: Secondary | ICD-10-CM | POA: Diagnosis not present

## 2019-05-10 DIAGNOSIS — I129 Hypertensive chronic kidney disease with stage 1 through stage 4 chronic kidney disease, or unspecified chronic kidney disease: Secondary | ICD-10-CM | POA: Diagnosis not present

## 2019-05-10 DIAGNOSIS — Z471 Aftercare following joint replacement surgery: Secondary | ICD-10-CM | POA: Diagnosis not present

## 2019-05-11 NOTE — Discharge Summary (Signed)
Physician Discharge Summary   Patient ID: ADOLF ORMISTON MRN: [id number] DOB/AGE: [birth year] 73 y.o.  Admit date: 05/06/2019 Discharge date: 05/09/2019  Primary Diagnosis: Primary osteoarthritis right knee  Admission Diagnoses:  Past Medical History:  Diagnosis Date  . Arthritis    "LITTLE IN MY LEFT HAND" (05/12/2018)  . Bilateral calf pain 03-02-2013   lower ext.doppler-mild arterial insufficiency to lower ext. this is a disease in val  . CAD (coronary artery disease)    a. cath 02/24/18 - 90% ost RCA; 40% ost & pro LAD  . Cerebral atherosclerosis 03-02-2013   carotid duplex- normal study  . Deviated septum    RIGHT  . GERD (gastroesophageal reflux disease)   . History of echocardiogram 08-02-2005   normal study   . History of kidney stones   . Hyperlipemia   . Hypertension 03-30-2013   PV Angiogram- rt. renal artery stent was widely open,50-60% lt. renal artery stenosis,lt. SFA stents patent with high grade above the knee  poplital stenosis  . Hyponatremia 02/03/2017  . Normal cardiac stress test 03-25-2013   EF 60%, normal stress test ,this is a presurgical  test  . Peripheral vascular disease ([place])   . PVD (peripheral vascular disease) ([place])    A. 2009: S/P PTA/stent to D SFA. B. 01/2017: angiosculpt atherectomy/drug-eluting balloon angioplasty to L SFA.  [name] Renal artery stenosis ([place]) 03-02-2013   renal artery doppler-this was an abnormal doppler   Discharge Diagnoses:   Principal Problem:   H/O total knee replacement, right Active Problems:   Mixed hyperlipidemia   HTN (hypertension)   PAD (peripheral artery disease) ([facility])   CAD (coronary artery disease)   Chest pain   GERD (gastroesophageal reflux disease)  Estimated body mass index is 24.77 kg/m as calculated from the following:   Height as of this encounter: $RemoveBeforeD'5\' 8"'KJFdtGjHocIyVh$  (1.727 m).   Weight as of this encounter: 73.9 kg.  Procedure:  Procedure(s) (LRB): TOTAL KNEE ARTHROPLASTY (Right)   Consults: cardiology  HPI:  Santina Evans, 73 y.o. male, has a history of pain and functional disability in the right knee due to arthritis and has failed non-surgical conservative treatments for greater than 12 weeks to includeNSAID's and/or analgesics, corticosteriod injections, flexibility and strengthening excercises and activity modification.  Onset of symptoms was gradual, starting >10 years ago with gradually worsening course since that time. The patient noted no past surgery on the right knee(s).  Patient currently rates pain in the right knee(s) at 8 out of 10 with activity. Patient has night pain, worsening of pain with activity and weight bearing, pain that interferes with activities of daily living, pain with passive range of motion, crepitus and joint swelling.  Patient has evidence of subchondral cysts, periarticular osteophytes and joint space narrowing by imaging studies. There is no active infection.  Laboratory Data: Admission on 05/06/2019, Discharged on 05/09/2019  Component Date Value Ref Range Status  . WBC 05/07/2019 14.6* 4.0 - 10.5 K/uL Final  . RBC 05/07/2019 3.02* 4.22 - 5.81 MIL/uL Final  . Hemoglobin 05/07/2019 9.0* 13.0 - 17.0 g/dL Final  . HCT 05/07/2019 27.8* 39.0 - 52.0 % Final  . MCV 05/07/2019 92.1  80.0 - 100.0 fL Final  . MCH 05/07/2019 29.8  26.0 - 34.0 pg Final  . MCHC 05/07/2019 32.4  30.0 - 36.0 g/dL Final  . RDW 05/07/2019 12.4  11.5 - 15.5 % Final  . Platelets 05/07/2019 167  150 - 400 K/uL Final  . nRBC 05/07/2019 0.0  0.0 -  0.2 % Final   Performed at [facility], [place] [address] [place], [place] [address]  . Sodium 05/07/2019 132* 135 - 145 mmol/L Final  . Potassium 05/07/2019 4.6  3.5 - 5.1 mmol/L Final  . Chloride 05/07/2019 102  98 - 111 mmol/L Final  . CO2 05/07/2019 19* 22 - 32 mmol/L Final  . Glucose, Bld 05/07/2019 140* 70 - 99 mg/dL Final  . BUN 05/07/2019 18  8 - 23 mg/dL Final  . Creatinine, Ser 05/07/2019 1.43* 0.61 - 1.24 mg/dL Final  . Calcium  05/07/2019 9.2  8.9 - 10.3 mg/dL Final  . GFR calc non Af Amer 05/07/2019 48* >60 mL/min Final  . GFR calc Af Amer 05/07/2019 56* >60 mL/min Final  . Anion gap 05/07/2019 11  5 - 15 Final   Performed at [facility], [place] [address], [place], [place] [address]  . Troponin I 05/07/2019 <0.03  <0.03 ng/mL Final   Performed at [facility], [place] [address] [place], [place] [address]  . Troponin I 05/07/2019 <0.03  <0.03 ng/mL Final   Performed at [facility], [place] [address] [place], [place] [address]  . Troponin I 05/07/2019 0.04* <0.03 ng/mL Final   Comment: CRITICAL RESULT CALLED TO, READ BACK BY AND VERIFIED WITH: L.NELSON AT 1658 ON 05/07/19 BY N.THOMPSON Performed at [facility], [place] [address] [place], [place] [address]   . Hgb A1c MFr Bld 05/07/2019 6.2* 4.8 - 5.6 % Final   Comment: (NOTE) Pre diabetes:          5.7%-6.4% Diabetes:              >6.4% Glycemic control for   <7.0% adults with diabetes   . Mean Plasma Glucose 05/07/2019 131.24  mg/dL Final   Performed at [place] [address], [place], [place] [address]  . Cholesterol 05/07/2019 111  0 - 200 mg/dL Final  . Triglycerides 05/07/2019 61  <150 mg/dL Final  . HDL 05/07/2019 37* >40 mg/dL Final  . Total CHOL/HDL Ratio 05/07/2019 3.0  RATIO Final  . VLDL 05/07/2019 12  0 - 40 mg/dL Final  . LDL Cholesterol 05/07/2019 62  0 - 99 mg/dL Final   Comment:        Total Cholesterol/HDL:CHD Risk Coronary Heart Disease Risk Table                     Men   Women  1/2 Average Risk   3.4   3.3  Average Risk       5.0   4.4  2 X Average Risk   9.6   7.1  3 X Average Risk  23.4   11.0        Use the calculated Patient Ratio above and the CHD Risk Table to determine the patient's CHD Risk.        ATP III CLASSIFICATION (LDL):  <100     mg/dL   Optimal  100-129  mg/dL   Near or Above                    Optimal  130-159  mg/dL    Borderline  160-189  mg/dL   High  >190     mg/dL   Very High Performed at [place] [address] [place], [place] [address]   . Weight 05/07/2019 2,606.4  oz Final  . Height 05/07/2019 68  in Final  . BP 05/07/2019 140/67  mmHg Final  . WBC 05/08/2019 13.3* 4.0 - 10.5 K/uL Final  . RBC 05/08/2019 2.96* 4.22 - 5.81 MIL/uL Final  . Hemoglobin 05/08/2019 8.6* 13.0 - 17.0 g/dL Final  . HCT 05/08/2019 27.6* 39.0 - 52.0 % Final  . MCV 05/08/2019 93.2  80.0 - 100.0 fL Final  . MCH 05/08/2019 29.1  26.0 - 34.0 pg Final  . MCHC 05/08/2019 31.2  30.0 - 36.0 g/dL Final  . RDW 05/08/2019 13.0  11.5 - 15.5 % Final  . Platelets 05/08/2019 186  150 - 400 K/uL Final  . nRBC 05/08/2019 0.0  0.0 - 0.2 % Final   Performed at [facility], [place] [address] [place], [place] [address]  . Sodium 05/08/2019 135  135 - 145 mmol/L Final  . Potassium 05/08/2019 4.2  3.5 - 5.1 mmol/L Final  . Chloride 05/08/2019 104  98 - 111 mmol/L Final  . CO2 05/08/2019 24  22 - 32 mmol/L Final  . Glucose, Bld 05/08/2019 102* 70 - 99 mg/dL Final  . BUN 05/08/2019 20  8 - 23 mg/dL Final  . Creatinine, Ser 05/08/2019 1.32* 0.61 - 1.24 mg/dL Final  . Calcium 05/08/2019 9.3  8.9 - 10.3 mg/dL Final  . GFR calc non Af Amer 05/08/2019 53* >60 mL/min Final  . GFR calc Af Amer 05/08/2019 >60  >60 mL/min Final  . Anion gap 05/08/2019 7  5 - 15 Final   Performed at [facility], [place] [address], [place], [place] [address]  . Troponin I 05/07/2019 0.04* <0.03 ng/mL Final   Comment: CRITICAL VALUE NOTED.  VALUE IS CONSISTENT WITH PREVIOUSLY REPORTED AND CALLED VALUE. Performed at [facility], [place] [address] [place], [place] [address]   . Troponin I 05/08/2019 0.04* <0.03 ng/mL Final   Comment: CRITICAL VALUE NOTED.  VALUE IS CONSISTENT WITH PREVIOUSLY REPORTED AND CALLED VALUE. Performed at [facility], [place] [address],  [place], [place] [address]   . WBC 05/09/2019 15.8* 4.0 - 10.5 K/uL Final  . RBC 05/09/2019 2.92* 4.22 - 5.81 MIL/uL Final  . Hemoglobin 05/09/2019 8.7* 13.0 - 17.0 g/dL Final  . HCT 05/09/2019 27.4* 39.0 - 52.0 % Final  . MCV 05/09/2019 93.8  80.0 - 100.0 fL Final  . MCH 05/09/2019 29.8  26.0 - 34.0 pg Final  . MCHC 05/09/2019 31.8  30.0 - 36.0 g/dL Final  . RDW 05/09/2019 12.7  11.5 - 15.5 % Final  . Platelets 05/09/2019 178  150 - 400 K/uL Final  . nRBC 05/09/2019 0.0  0.0 - 0.2 % Final   Performed at [facility], [place] [address], [place], [place] [address]  Hospital Outpatient Visit on 05/01/2019  Component Date Value Ref Range Status  . SARS-CoV-2, NAA 05/01/2019 NOT DETECTED  NOT DETECTED Final   Comment: (NOTE) Testing was performed using the cobas(R) SARS-CoV-2 test. This test was developed and its performance characteristics determined by [employer]. This test has not been FDA cleared or approved. This test has been authorized by FDA under an Emergency Use Authorization (EUA). This test is only authorized for the duration of time the declaration that circumstances exist justifying the authorization of the emergency use of in vitro diagnostic tests for detection of SARS-CoV-2 virus and/or diagnosis of COVID-19 infection under section 564(b)(1) of the Act, 21 U.S.C. 563OVF-6(E)(3), unless the authorization is terminated or revoked sooner. When diagnostic testing is negative, the possibility  of a false negative result should be considered in the context of a patient's recent exposures and the presence of clinical signs and symptoms consistent with COVID-19. An individual without symptoms of COVID-19 and who is not shedding SARS-CoV-2 virus would expect to have                           a negative (not detected) result in this assay. Performed At: [facility] [place], [place] [id number] Rush Farmer MD RK:[id number]   .  Coronavirus Source 05/01/2019 NASOPHARYNGEAL   Final   Performed at [facility], [place] [address], [place], [place] [address]  Hospital Outpatient Visit on 04/24/2019  Component Date Value Ref Range Status  . MRSA, PCR 04/24/2019 NEGATIVE  NEGATIVE Final  . Staphylococcus aureus 04/24/2019 NEGATIVE  NEGATIVE Final   Comment: (NOTE) The Xpert SA Assay (FDA approved for NASAL specimens in patients 97 years of age and older), is one component of a comprehensive surveillance program. It is not intended to diagnose infection nor to guide or monitor treatment. Performed at [facility], [place] [address] [place], [place] [address]   . aPTT 04/24/2019 29  24 - 36 seconds Final   Performed at [facility], [place] [address], [place], [place] [address]  . WBC 04/24/2019 6.9  4.0 - 10.5 K/uL Final  . RBC 04/24/2019 4.12* 4.22 - 5.81 MIL/uL Final  . Hemoglobin 04/24/2019 12.2* 13.0 - 17.0 g/dL Final  . HCT 04/24/2019 37.5* 39.0 - 52.0 % Final  . MCV 04/24/2019 91.0  80.0 - 100.0 fL Final  . MCH 04/24/2019 29.6  26.0 - 34.0 pg Final  . MCHC 04/24/2019 32.5  30.0 - 36.0 g/dL Final  . RDW 04/24/2019 12.3  11.5 - 15.5 % Final  . Platelets 04/24/2019 214  150 - 400 K/uL Final  . nRBC 04/24/2019 0.0  0.0 - 0.2 % Final  . Neutrophils Relative % 04/24/2019 62  % Final  . Neutro Abs 04/24/2019 4.3  1.7 - 7.7 K/uL Final  . Lymphocytes Relative 04/24/2019 26  % Final  . Lymphs Abs 04/24/2019 1.8  0.7 - 4.0 K/uL Final  . Monocytes Relative 04/24/2019 12  % Final  . Monocytes Absolute 04/24/2019 0.8  0.1 - 1.0 K/uL Final  . Eosinophils Relative 04/24/2019 0  % Final  . Eosinophils Absolute 04/24/2019 0.0  0.0 - 0.5 K/uL Final  . Basophils Relative 04/24/2019 0  % Final  . Basophils Absolute 04/24/2019 0.0  0.0 - 0.1 K/uL Final  . Immature Granulocytes 04/24/2019 0  % Final  . Abs Immature Granulocytes 04/24/2019 0.03  0.00 - 0.07 K/uL Final   Performed at [facility], [place] [address], [place], [place] [address]  . Sodium 04/24/2019 132* 135 - 145 mmol/L Final  . Potassium 04/24/2019 4.9  3.5 - 5.1 mmol/L Final  . Chloride 04/24/2019 102  98 - 111 mmol/L Final  . CO2 04/24/2019 22  22 - 32 mmol/L Final  . Glucose, Bld 04/24/2019 102* 70 - 99 mg/dL Final  . BUN 04/24/2019 26* 8 - 23 mg/dL Final  . Creatinine, Ser 04/24/2019 1.57* 0.61 - 1.24 mg/dL Final  . Calcium 04/24/2019 9.8  8.9 - 10.3 mg/dL Final  . Total Protein 04/24/2019 7.5  6.5 - 8.1 g/dL Final  . Albumin 04/24/2019 4.5  3.5 - 5.0 g/dL Final  . AST 04/24/2019 20  15 -  41 U/L Final  . ALT 04/24/2019 16  0 - 44 U/L Final  . Alkaline Phosphatase 04/24/2019 191* 38 - 126 U/L Final  . Total Bilirubin 04/24/2019 0.8  0.3 - 1.2 mg/dL Final  . GFR calc non Af Amer 04/24/2019 43* >60 mL/min Final  . GFR calc Af Amer 04/24/2019 50* >60 mL/min Final  . Anion gap 04/24/2019 8  5 - 15 Final   Performed at [facility], [address], [place], [place] [address]  . Prothrombin Time 04/24/2019 13.3  11.4 - 15.2 seconds Final  . INR 04/24/2019 1.0  0.8 - 1.2 Final   Comment: (NOTE) INR goal varies based on device and disease states. Performed at [facility], [address], [place], [place] [address]      X-Rays:Dg Chest Port 1 View  Result Date: 05/07/2019 CLINICAL DATA:  Chest pain. EXAM: PORTABLE CHEST 1 VIEW COMPARISON:  01/24/2017. FINDINGS: Mediastinum and hilar structures normal. Mild right mid lung field and left base subsegmental atelectasis. No focal alveolar infiltrate. No pleural effusion or pneumothorax. Heart size normal. Thoracic spine scoliosis and degenerative change. Degenerative changes both shoulders. Diffuse osteopenia. Surgical clips and a stent noted over the right neck. Left carotid calcification noted. IMPRESSION: 1. Mild right mid lung field and left base subsegmental atelectasis. No acute infiltrates. Heart size  normal. 2. Surgical clips and a vascular stent noted over the right neck. Left carotid calcification consistent atherosclerotic vascular disease. Electronically Signed   By: Maisie Fus  Register   On: 05/07/2019 06:12    EKG: Orders placed or performed during the hospital encounter of 05/06/19  . EKG 12-Lead  . EKG 12-Lead     Hospital Course: RYOMA NOFZIGER is a 73 y.o. who was admitted to [facility]. They were brought to the operating room on 05/06/2019 and underwent Procedure(s): TOTAL KNEE ARTHROPLASTY.  Patient tolerated the procedure well and was later transferred to the recovery room and then to the orthopaedic floor for postoperative care.  They were given PO and IV analgesics for pain control following their surgery.  They were given 24 hours of postoperative antibiotics of  Anti-infectives (From admission, onward)   Start     Dose/Rate Route Frequency Ordered Stop   05/06/19 1400  ceFAZolin (ANCEF) IVPB 1 g/50 mL premix     1 g 100 mL/hr over 30 Minutes Intravenous Every 6 hours 05/06/19 1229 05/07/19 1912   05/06/19 0932  polymyxin B 500,000 Units, bacitracin 50,000 Units in sodium chloride 0.9 % 500 mL irrigation  Status:  Discontinued       As needed 05/06/19 0932 05/06/19 0951   05/06/19 0600  ceFAZolin (ANCEF) IVPB 2g/100 mL premix     2 g 200 mL/hr over 30 Minutes Intravenous On call to O.R. 05/06/19 0388 05/06/19 0742     and started on DVT prophylaxis in the form of Xarelto.   PT and OT were ordered for total joint protocol.  Discharge planning consulted to help with postop disposition and equipment needs.  Patient had a difficult night on the evening of surgery, experienced chest pain. Cardiology consulted and after labs and echo felt that it was mostly anxiety related. Patient remained on telemetry as a precaution. They started to get up OOB with therapy on day one. Hemovac drain was pulled without difficulty.  Continued to work with therapy into day two.  Dressing was  changed on day two and the incision was clean and dry. Patient switched  back to Plavix. Patient required additional day stay due to hypotension with PT. By day three, the patient had progressed with therapy and meeting their goals.  Incision was healing well.  Patient was seen in rounds and was ready to go home.   Diet: Cardiac diet Activity:WBAT Follow-up:in 2 weeks Disposition - Home Discharged Condition: stable   Discharge Instructions    Call MD / Call 911   Complete by:  As directed    If you experience chest pain or shortness of breath, CALL 911 and be transported to the hospital emergency room.  If you develope a fever above 101 F, pus (white drainage) or increased drainage or redness at the wound, or calf pain, call your surgeon's office.   Constipation Prevention   Complete by:  As directed    Drink plenty of fluids.  Prune juice may be helpful.  You may use a stool softener, such as Colace (over the counter) 100 mg twice a day.  Use MiraLax (over the counter) for constipation as needed.   Diet - low sodium heart healthy   Complete by:  As directed    Discharge instructions   Complete by:  As directed    Dr. Latanya Maudlin [facility] [address] [place], [place] [address] [phone]  TOTAL KNEE REPLACEMENT POSTOPERATIVE DIRECTIONS  Knee Rehabilitation, Guidelines Following Surgery  Results after knee surgery are often greatly improved when you follow the exercise, range of motion and muscle strengthening exercises prescribed by your doctor. Safety measures are also important to protect the knee from further injury. Any time any of these exercises cause you to have increased pain or swelling in your knee joint, decrease the amount until you are comfortable again and slowly increase them. If you have problems or questions, call your caregiver or physical therapist for advice.   HOME CARE INSTRUCTIONS  Remove items at home which could result in a fall. This  includes throw rugs or furniture in walking pathways.  ICE to the affected knee every three hours for 30 minutes at a time and then as needed for pain and swelling.  Continue to use ice on the knee for pain and swelling from surgery. You may notice swelling that will progress down to the foot and ankle.  This is normal after surgery.  Elevate the leg when you are not up walking on it.   Continue to use the breathing machine which will help keep your temperature down.  It is common for your temperature to cycle up and down following surgery, especially at night when you are not up moving around and exerting yourself.  The breathing machine keeps your lungs expanded and your temperature down. Do not place pillow under knee, focus on keeping the knee straight while resting  DIET You may resume your previous home diet once your are discharged from the hospital.  DRESSING / WOUND CARE / SHOWERING You may shower 3 days after surgery, but keep the wounds dry during showering.  You may use an occlusive plastic wrap (Press'n Seal for example), NO SOAKING/SUBMERGING IN THE BATHTUB.  If the bandage gets wet, change with a clean dry gauze.  If the incision gets wet, pat the wound dry with a clean towel. You may start showering once you are discharged home but do not submerge the incision under water. Just pat the incision dry and apply a dry gauze dressing on daily. Change the surgical dressing daily and reapply a dry dressing each time.  ACTIVITY Walk with your walker as instructed. Use walker as long as suggested by your caregivers. Avoid periods of inactivity such as sitting longer than an hour when not asleep. This helps prevent blood clots.  You may resume a sexual relationship in one month or when given the OK by your doctor.  You may return to work once you are cleared by your doctor.  Do not drive a car for 6 weeks or until released by you surgeon.  Do not drive while taking narcotics.  WEIGHT  BEARING Weight bearing as tolerated with assist device (walker, cane, etc) as directed, use it as long as suggested by your surgeon or therapist, typically at least 4-6 weeks.  POSTOPERATIVE CONSTIPATION PROTOCOL Constipation - defined medically as fewer than three stools per week and severe constipation as less than one stool per week.  One of the most common issues patients have following surgery is constipation.  Even if you have a regular bowel pattern at home, your normal regimen is likely to be disrupted due to multiple reasons following surgery.  Combination of anesthesia, postoperative narcotics, change in appetite and fluid intake all can affect your bowels.  In order to avoid complications following surgery, here are some recommendations in order to help you during your recovery period.  Colace (docusate) - Pick up an over-the-counter form of Colace or another stool softener and take twice a day as long as you are requiring postoperative pain medications.  Take with a full glass of water daily.  If you experience loose stools or diarrhea, hold the colace until you stool forms back up.  If your symptoms do not get better within 1 week or if they get worse, check with your doctor.  Dulcolax (bisacodyl) - Pick up over-the-counter and take as directed by the product packaging as needed to assist with the movement of your bowels.  Take with a full glass of water.  Use this product as needed if not relieved by Colace only.   MiraLax (polyethylene glycol) - Pick up over-the-counter to have on hand.  MiraLax is a solution that will increase the amount of water in your bowels to assist with bowel movements.  Take as directed and can mix with a glass of water, juice, soda, coffee, or tea.  Take if you go more than two days without a movement. Do not use MiraLax more than once per day. Call your doctor if you are still constipated or irregular after using this medication for 7 days in a row.  If you  continue to have problems with postoperative constipation, please contact the office for further assistance and recommendations.  If you experience "the worst abdominal pain ever" or develop nausea or vomiting, please contact the office immediatly for further recommendations for treatment.  ITCHING  If you experience itching with your medications, try taking only a single pain pill, or even half a pain pill at a time.  You can also use Benadryl over the counter for itching or also to help with sleep.   TED HOSE STOCKINGS Wear the elastic stockings on both legs for three weeks following surgery during the day but you may remove then at night for sleeping.  MEDICATIONS See your medication summary on the "After Visit Summary" that the nursing staff will review with you prior to discharge.  You may have some home medications which will be placed on hold until you complete the course of blood thinner medication.  It is important for you to complete  the blood thinner medication as prescribed by your surgeon.  Continue your approved medications as instructed at time of discharge.  PRECAUTIONS If you experience chest pain or shortness of breath - call 911 immediately for transfer to the hospital emergency department.  If you develop a fever greater that 101 F, purulent drainage from wound, increased redness or drainage from wound, foul odor from the wound/dressing, or calf pain - CONTACT YOUR SURGEON.                                                   FOLLOW-UP APPOINTMENTS Make sure you keep all of your appointments after your operation with your surgeon and caregivers. You should call the office at the above phone number and make an appointment for approximately two weeks after the date of your surgery or on the date instructed by your surgeon outlined in the "After Visit Summary".   RANGE OF MOTION AND STRENGTHENING EXERCISES  Rehabilitation of the knee is important following a knee injury or an  operation. After just a few days of immobilization, the muscles of the thigh which control the knee become weakened and shrink (atrophy). Knee exercises are designed to build up the tone and strength of the thigh muscles and to improve knee motion. Often times heat used for twenty to thirty minutes before working out will loosen up your tissues and help with improving the range of motion but do not use heat for the first two weeks following surgery. These exercises can be done on a training (exercise) mat, on the floor, on a table or on a bed. Use what ever works the best and is most comfortable for you Knee exercises include:  Leg Lifts - While your knee is still immobilized in a splint or cast, you can do straight leg raises. Lift the leg to 60 degrees, hold for 3 sec, and slowly lower the leg. Repeat 10-20 times 2-3 times daily. Perform this exercise against resistance later as your knee gets better.  Quad and Hamstring Sets - Tighten up the muscle on the front of the thigh (Quad) and hold for 5-10 sec. Repeat this 10-20 times hourly. Hamstring sets are done by pushing the foot backward against an object and holding for 5-10 sec. Repeat as with quad sets.  Leg Slides: Lying on your back, slowly slide your foot toward your buttocks, bending your knee up off the floor (only go as far as is comfortable). Then slowly slide your foot back down until your leg is flat on the floor again. [name] Wings: Lying on your back spread your legs to the side as far apart as you can without causing discomfort.  A rehabilitation program following serious knee injuries can speed recovery and prevent re-injury in the future due to weakened muscles. Contact your doctor or a physical therapist for more information on knee rehabilitation.   IF YOU ARE TRANSFERRED TO A SKILLED REHAB FACILITY If the patient is transferred to a skilled rehab facility following release from the hospital, a list of the current medications will be sent  to the facility for the patient to continue.  When discharged from the skilled rehab facility, please have the facility set up the patient's [place] prior to being released. Also, the skilled facility will be responsible for providing the patient with their medications at  time of release from the facility to include their pain medication, the muscle relaxants, and their blood thinner medication. If the patient is still at the rehab facility at time of the two week follow up appointment, the skilled rehab facility will also need to assist the patient in arranging follow up appointment in our office and any transportation needs.  MAKE SURE YOU:  Understand these instructions.  Get help right away if you are not doing well or get worse.    Pick up stool softner and laxative for home use following surgery while on pain medications. Do not submerge incision under water. Please use good hand washing techniques while changing dressing each day. May shower starting three days after surgery. Please use a clean towel to pat the incision dry following showers. Continue to use ice for pain and swelling after surgery. Do not use any lotions or creams on the incision until instructed by your surgeon.   Increase activity slowly as tolerated   Complete by:  As directed      Allergies as of 05/09/2019      Reactions   Statins Swelling, Other (See Comments)   Muscle pain, also      Medication List    TAKE these medications   amLODipine 10 MG tablet Commonly known as:  NORVASC TAKE 1 TABLET BY MOUTH ONCE DAILY   aspirin 81 MG tablet Take 81 mg by mouth daily.   atorvastatin 40 MG tablet Commonly known as:  LIPITOR Take 40 mg by mouth every evening.   Biotin 5000 MCG Caps Take 5,000 mcg by mouth daily.   clopidogrel 75 MG tablet Commonly known as:  PLAVIX Take 1 tablet by mouth once daily   ezetimibe 10 MG tablet Commonly known as:  ZETIA TAKE 1 TABLET BY MOUTH ONCE  DAILY   ferrous sulfate 325 (65 FE) MG tablet Take 1 tablet (325 mg total) by mouth 2 (two) times daily with a meal.   irbesartan 300 MG tablet Commonly known as:  AVAPRO TAKE 1 TABLET BY MOUTH ONCE DAILY   nitroGLYCERIN 0.4 MG SL tablet Commonly known as:  Nitrostat Place 1 tablet (0.4 mg total) under the tongue every 5 (five) minutes as needed. What changed:  reasons to take this   oxyCODONE 5 MG immediate release tablet Commonly known as:  Oxy IR/ROXICODONE Take 1-2 tablets (5-10 mg total) by mouth every 6 (six) hours as needed for moderate pain.   pantoprazole 40 MG tablet Commonly known as:  Protonix Take 1 tablet (40 mg total) by mouth daily.   tamsulosin 0.4 MG Caps capsule Commonly known as:  FLOMAX Take 0.4 mg by mouth every other day. AT HS   tiZANidine 4 MG tablet Commonly known as:  ZANAFLEX Take 1 tablet (4 mg total) by mouth every 6 (six) hours as needed.      Follow-up Information    Latanya Maudlin, MD. Schedule an appointment as soon as possible for a visit on 05/19/2019.   Specialty:  Orthopedic Surgery Contact information: [address] [place] [address] [phone]        Erlene Quan, PA-C Follow up on 05/25/2019.   Specialties:  Cardiology, Radiology Why:  You have been scheduled for a telemedicine visit with Kerin Ransom, PA-C 05/25/2019 at 11:00am. You will receive a call 2-3 days prior to your appointment with additional instructions. Please refer to your discharge papers for more info.  Contact information: [place] [address]  [phone]           Signed: Ardeen Jourdain, PA-C Orthopaedic Surgery 05/11/2019, 8:35 AM

## 2019-05-13 DIAGNOSIS — I441 Atrioventricular block, second degree: Secondary | ICD-10-CM | POA: Diagnosis not present

## 2019-05-13 DIAGNOSIS — Z87891 Personal history of nicotine dependence: Secondary | ICD-10-CM | POA: Diagnosis not present

## 2019-05-13 DIAGNOSIS — N183 Chronic kidney disease, stage 3 (moderate): Secondary | ICD-10-CM | POA: Diagnosis not present

## 2019-05-13 DIAGNOSIS — Z471 Aftercare following joint replacement surgery: Secondary | ICD-10-CM | POA: Diagnosis not present

## 2019-05-13 DIAGNOSIS — I2510 Atherosclerotic heart disease of native coronary artery without angina pectoris: Secondary | ICD-10-CM | POA: Diagnosis not present

## 2019-05-13 DIAGNOSIS — I739 Peripheral vascular disease, unspecified: Secondary | ICD-10-CM | POA: Diagnosis not present

## 2019-05-13 DIAGNOSIS — Z7982 Long term (current) use of aspirin: Secondary | ICD-10-CM | POA: Diagnosis not present

## 2019-05-13 DIAGNOSIS — Z96651 Presence of right artificial knee joint: Secondary | ICD-10-CM | POA: Diagnosis not present

## 2019-05-13 DIAGNOSIS — Z9181 History of falling: Secondary | ICD-10-CM | POA: Diagnosis not present

## 2019-05-13 DIAGNOSIS — I129 Hypertensive chronic kidney disease with stage 1 through stage 4 chronic kidney disease, or unspecified chronic kidney disease: Secondary | ICD-10-CM | POA: Diagnosis not present

## 2019-05-13 DIAGNOSIS — I6523 Occlusion and stenosis of bilateral carotid arteries: Secondary | ICD-10-CM | POA: Diagnosis not present

## 2019-05-13 DIAGNOSIS — E782 Mixed hyperlipidemia: Secondary | ICD-10-CM | POA: Diagnosis not present

## 2019-05-15 DIAGNOSIS — I739 Peripheral vascular disease, unspecified: Secondary | ICD-10-CM | POA: Diagnosis not present

## 2019-05-15 DIAGNOSIS — I129 Hypertensive chronic kidney disease with stage 1 through stage 4 chronic kidney disease, or unspecified chronic kidney disease: Secondary | ICD-10-CM | POA: Diagnosis not present

## 2019-05-15 DIAGNOSIS — E782 Mixed hyperlipidemia: Secondary | ICD-10-CM | POA: Diagnosis not present

## 2019-05-15 DIAGNOSIS — I2510 Atherosclerotic heart disease of native coronary artery without angina pectoris: Secondary | ICD-10-CM | POA: Diagnosis not present

## 2019-05-15 DIAGNOSIS — Z96651 Presence of right artificial knee joint: Secondary | ICD-10-CM | POA: Diagnosis not present

## 2019-05-15 DIAGNOSIS — N183 Chronic kidney disease, stage 3 (moderate): Secondary | ICD-10-CM | POA: Diagnosis not present

## 2019-05-15 DIAGNOSIS — Z87891 Personal history of nicotine dependence: Secondary | ICD-10-CM | POA: Diagnosis not present

## 2019-05-15 DIAGNOSIS — Z9181 History of falling: Secondary | ICD-10-CM | POA: Diagnosis not present

## 2019-05-15 DIAGNOSIS — Z7982 Long term (current) use of aspirin: Secondary | ICD-10-CM | POA: Diagnosis not present

## 2019-05-15 DIAGNOSIS — I6523 Occlusion and stenosis of bilateral carotid arteries: Secondary | ICD-10-CM | POA: Diagnosis not present

## 2019-05-15 DIAGNOSIS — Z471 Aftercare following joint replacement surgery: Secondary | ICD-10-CM | POA: Diagnosis not present

## 2019-05-15 DIAGNOSIS — I441 Atrioventricular block, second degree: Secondary | ICD-10-CM | POA: Diagnosis not present

## 2019-05-18 ENCOUNTER — Telehealth: Payer: Self-pay | Admitting: Cardiology

## 2019-05-18 DIAGNOSIS — I739 Peripheral vascular disease, unspecified: Secondary | ICD-10-CM | POA: Diagnosis not present

## 2019-05-18 DIAGNOSIS — Z87891 Personal history of nicotine dependence: Secondary | ICD-10-CM | POA: Diagnosis not present

## 2019-05-18 DIAGNOSIS — Z7982 Long term (current) use of aspirin: Secondary | ICD-10-CM | POA: Diagnosis not present

## 2019-05-18 DIAGNOSIS — Z471 Aftercare following joint replacement surgery: Secondary | ICD-10-CM | POA: Diagnosis not present

## 2019-05-18 DIAGNOSIS — I129 Hypertensive chronic kidney disease with stage 1 through stage 4 chronic kidney disease, or unspecified chronic kidney disease: Secondary | ICD-10-CM | POA: Diagnosis not present

## 2019-05-18 DIAGNOSIS — I6523 Occlusion and stenosis of bilateral carotid arteries: Secondary | ICD-10-CM | POA: Diagnosis not present

## 2019-05-18 DIAGNOSIS — Z96651 Presence of right artificial knee joint: Secondary | ICD-10-CM | POA: Diagnosis not present

## 2019-05-18 DIAGNOSIS — E782 Mixed hyperlipidemia: Secondary | ICD-10-CM | POA: Diagnosis not present

## 2019-05-18 DIAGNOSIS — I441 Atrioventricular block, second degree: Secondary | ICD-10-CM | POA: Diagnosis not present

## 2019-05-18 DIAGNOSIS — N183 Chronic kidney disease, stage 3 (moderate): Secondary | ICD-10-CM | POA: Diagnosis not present

## 2019-05-18 DIAGNOSIS — I2510 Atherosclerotic heart disease of native coronary artery without angina pectoris: Secondary | ICD-10-CM | POA: Diagnosis not present

## 2019-05-18 DIAGNOSIS — Z9181 History of falling: Secondary | ICD-10-CM | POA: Diagnosis not present

## 2019-05-18 NOTE — Telephone Encounter (Signed)
Phone visit/call home #/no my chart/pre reg complete/consent obtained -- ttf

## 2019-05-20 ENCOUNTER — Other Ambulatory Visit: Payer: Self-pay | Admitting: Cardiovascular Disease

## 2019-05-20 DIAGNOSIS — Z87891 Personal history of nicotine dependence: Secondary | ICD-10-CM | POA: Diagnosis not present

## 2019-05-20 DIAGNOSIS — I129 Hypertensive chronic kidney disease with stage 1 through stage 4 chronic kidney disease, or unspecified chronic kidney disease: Secondary | ICD-10-CM | POA: Diagnosis not present

## 2019-05-20 DIAGNOSIS — I441 Atrioventricular block, second degree: Secondary | ICD-10-CM | POA: Diagnosis not present

## 2019-05-20 DIAGNOSIS — N183 Chronic kidney disease, stage 3 (moderate): Secondary | ICD-10-CM | POA: Diagnosis not present

## 2019-05-20 DIAGNOSIS — Z7982 Long term (current) use of aspirin: Secondary | ICD-10-CM | POA: Diagnosis not present

## 2019-05-20 DIAGNOSIS — I739 Peripheral vascular disease, unspecified: Secondary | ICD-10-CM | POA: Diagnosis not present

## 2019-05-20 DIAGNOSIS — E782 Mixed hyperlipidemia: Secondary | ICD-10-CM | POA: Diagnosis not present

## 2019-05-20 DIAGNOSIS — Z96651 Presence of right artificial knee joint: Secondary | ICD-10-CM | POA: Diagnosis not present

## 2019-05-20 DIAGNOSIS — Z471 Aftercare following joint replacement surgery: Secondary | ICD-10-CM | POA: Diagnosis not present

## 2019-05-20 DIAGNOSIS — Z9181 History of falling: Secondary | ICD-10-CM | POA: Diagnosis not present

## 2019-05-20 DIAGNOSIS — I6523 Occlusion and stenosis of bilateral carotid arteries: Secondary | ICD-10-CM | POA: Diagnosis not present

## 2019-05-20 DIAGNOSIS — I2510 Atherosclerotic heart disease of native coronary artery without angina pectoris: Secondary | ICD-10-CM | POA: Diagnosis not present

## 2019-05-22 DIAGNOSIS — I129 Hypertensive chronic kidney disease with stage 1 through stage 4 chronic kidney disease, or unspecified chronic kidney disease: Secondary | ICD-10-CM | POA: Diagnosis not present

## 2019-05-22 DIAGNOSIS — I2510 Atherosclerotic heart disease of native coronary artery without angina pectoris: Secondary | ICD-10-CM | POA: Diagnosis not present

## 2019-05-22 DIAGNOSIS — Z7982 Long term (current) use of aspirin: Secondary | ICD-10-CM | POA: Diagnosis not present

## 2019-05-22 DIAGNOSIS — I6523 Occlusion and stenosis of bilateral carotid arteries: Secondary | ICD-10-CM | POA: Diagnosis not present

## 2019-05-22 DIAGNOSIS — Z471 Aftercare following joint replacement surgery: Secondary | ICD-10-CM | POA: Diagnosis not present

## 2019-05-22 DIAGNOSIS — I441 Atrioventricular block, second degree: Secondary | ICD-10-CM | POA: Diagnosis not present

## 2019-05-22 DIAGNOSIS — N183 Chronic kidney disease, stage 3 (moderate): Secondary | ICD-10-CM | POA: Diagnosis not present

## 2019-05-22 DIAGNOSIS — Z9181 History of falling: Secondary | ICD-10-CM | POA: Diagnosis not present

## 2019-05-22 DIAGNOSIS — Z87891 Personal history of nicotine dependence: Secondary | ICD-10-CM | POA: Diagnosis not present

## 2019-05-22 DIAGNOSIS — I739 Peripheral vascular disease, unspecified: Secondary | ICD-10-CM | POA: Diagnosis not present

## 2019-05-22 DIAGNOSIS — Z96651 Presence of right artificial knee joint: Secondary | ICD-10-CM | POA: Diagnosis not present

## 2019-05-22 DIAGNOSIS — E782 Mixed hyperlipidemia: Secondary | ICD-10-CM | POA: Diagnosis not present

## 2019-05-25 ENCOUNTER — Telehealth (INDEPENDENT_AMBULATORY_CARE_PROVIDER_SITE_OTHER): Payer: Medicare Other | Admitting: Cardiology

## 2019-05-25 ENCOUNTER — Telehealth: Payer: Self-pay

## 2019-05-25 VITALS — BP 132/75 | Ht 67.5 in | Wt 151.2 lb

## 2019-05-25 DIAGNOSIS — I6523 Occlusion and stenosis of bilateral carotid arteries: Secondary | ICD-10-CM

## 2019-05-25 DIAGNOSIS — N183 Chronic kidney disease, stage 3 unspecified: Secondary | ICD-10-CM

## 2019-05-25 DIAGNOSIS — M1711 Unilateral primary osteoarthritis, right knee: Secondary | ICD-10-CM | POA: Diagnosis not present

## 2019-05-25 DIAGNOSIS — I10 Essential (primary) hypertension: Secondary | ICD-10-CM

## 2019-05-25 DIAGNOSIS — I200 Unstable angina: Secondary | ICD-10-CM | POA: Diagnosis not present

## 2019-05-25 DIAGNOSIS — I739 Peripheral vascular disease, unspecified: Secondary | ICD-10-CM

## 2019-05-25 DIAGNOSIS — I2510 Atherosclerotic heart disease of native coronary artery without angina pectoris: Secondary | ICD-10-CM

## 2019-05-25 DIAGNOSIS — E782 Mixed hyperlipidemia: Secondary | ICD-10-CM

## 2019-05-25 NOTE — Telephone Encounter (Signed)
Contacted patient to discuss AVS Instructions. Gave him Luke's recommendations from today's office visit. Informed patient that someone from the scheduling dept will be contacting him to schedule his follow up appt. He voiced understanding and AVS mailed to patient.

## 2019-05-25 NOTE — Patient Instructions (Addendum)
Medication Instructions:  Your physician recommends that you continue on your current medications as directed. Please refer to the Current Medication list given to you today.  If you need a refill on your cardiac medications before your next appointment, please call your pharmacy.   Lab work: None  If you have labs (blood work) drawn today and your tests are completely normal, you will receive your results only by: [name] MyChart Message (if you have MyChart) OR . A paper copy in the mail If you have any lab test that is abnormal or we need to change your treatment, we will call you to review the results.  Testing/Procedures: None   Follow-Up: At [facility], you and your health needs are our priority.  As part of our continuing mission to provide you with exceptional heart care, we have created designated Provider Care Teams.  These Care Teams include your primary Cardiologist (physician) and Advanced Practice Providers (APPs -  Physician Assistants and Nurse Practitioners) who all work together to provide you with the care you need, when you need it. You will need a follow up appointment in 5 months.  Please call our office 2 months in advance to schedule this appointment.  You may see [name], MD or one of the following Advanced Practice Providers on your designated Care Team:   Kerin Ransom, PA-C Roby Lofts, [place] . Sande Rives, PA-C  Any Other Special Instructions Will Be Listed Below (If Applicable).

## 2019-05-25 NOTE — Progress Notes (Signed)
Virtual Visit via Telephone Note   This visit type was conducted due to national recommendations for restrictions regarding the COVID-19 Pandemic (e.g. social distancing) in an effort to limit this patient's exposure and mitigate transmission in our community.  Due to his co-morbid illnesses, this patient is at least at moderate risk for complications without adequate follow up.  This format is felt to be most appropriate for this patient at this time.  The patient did not have access to video technology/had technical difficulties with video requiring transitioning to audio format only (telephone).  All issues noted in this document were discussed and addressed.  No physical exam could be performed with this format.  Please refer to the patient's chart for his  consent to telehealth for [facility].   Date:  05/25/2019   ID:  Joshua Wyatt, DOB [birth year], MRN [id number]  Patient Location: Home Provider Location: Office  PCP:  Jani Gravel, MD  Cardiologist:  [name], MD  Electrophysiologist:  None   Evaluation Performed:  Follow-Up Visit  Chief Complaint:  none  History of Present Illness:    Joshua Wyatt is a 73 y.o. malewith a PMH of CAD s/p PCI/DES to RCA 03/2018, HTN, HLD, PVD s/p bilateral SFA stenting , renal artery stenting, carotid artery disease s/p CEA, and CKD stage 3, who was seen in the hospital 05/08/2019 for chest pain s/p right knee replacement (post-op day 1).  His Troponin were negative.  Echo showed normal LVF with no WMA.  No further work up was suggested.   He was contacted today for follow up.  He denies any chest pain since discharge. He is recovering from his knee surgery though he admits "its rough".  He has resumed his Plavix.   The patient does not have symptoms concerning for COVID-19 infection (fever, chills, cough, or new shortness of breath).    Past Medical History:  Diagnosis Date  . Arthritis    "LITTLE IN MY LEFT HAND" (05/12/2018)  .  Bilateral calf pain 03-02-2013   lower ext.doppler-mild arterial insufficiency to lower ext. this is a disease in val  . CAD (coronary artery disease)    a. cath 02/24/18 - 90% ost RCA; 40% ost & pro LAD  . Cerebral atherosclerosis 03-02-2013   carotid duplex- normal study  . Deviated septum    RIGHT  . GERD (gastroesophageal reflux disease)   . History of echocardiogram 08-02-2005   normal study   . History of kidney stones   . Hyperlipemia   . Hypertension 03-30-2013   PV Angiogram- rt. renal artery stent was widely open,50-60% lt. renal artery stenosis,lt. SFA stents patent with high grade above the knee  poplital stenosis  . Hyponatremia 02/03/2017  . Normal cardiac stress test 03-25-2013   EF 60%, normal stress test ,this is a presurgical  test  . Peripheral vascular disease ([place])   . PVD (peripheral vascular disease) ([place])    A. 2009: S/P PTA/stent to D SFA. B. 01/2017: angiosculpt atherectomy/drug-eluting balloon angioplasty to L SFA.  [name] Renal artery stenosis ([place]) 03-02-2013   renal artery doppler-this was an abnormal doppler   Past Surgical History:  Procedure Laterality Date  . APPENDECTOMY  1960's  . CAROTID ENDARTERECTOMY Bilateral 2000's  . CORONARY ANGIOPLASTY WITH STENT PLACEMENT  03/13/2018  . CORONARY STENT INTERVENTION N/A 03/13/2018   Procedure: CORONARY STENT INTERVENTION;  Surgeon: Lorretta Harp, MD;  Location: [facility];  Service: Cardiovascular;  Laterality: N/A;  . CYSTOSCOPY  W/ STONE MANIPULATION  "X 4 or 5"  . LEFT HEART CATH AND CORONARY ANGIOGRAPHY N/A 02/24/2018   Procedure: LEFT HEART CATH AND CORONARY ANGIOGRAPHY;  Surgeon: Lorretta Harp, MD;  Location: [facility];  Service: Cardiovascular;  Laterality: N/A;  . LITHOTRIPSY  "several"  . LOWER EXTREMITY ANGIOGRAM N/A 10/11/2014   Procedure: LOWER EXTREMITY ANGIOGRAM;  Surgeon: Lorretta Harp, MD;  Location: [facility] CATH LAB;  Service: Cardiovascular;  Laterality: N/A;  . LOWER EXTREMITY  ANGIOGRAPHY N/A 02/04/2017   Procedure: Lower Extremity Angiography;  Surgeon: Lorretta Harp, MD;  Location: [place];  Service: Cardiovascular;  Laterality: N/A;  . LOWER EXTREMITY ANGIOGRAPHY N/A 05/12/2018   Procedure: LOWER EXTREMITY ANGIOGRAPHY;  Surgeon: Lorretta Harp, MD;  Location: [facility] [place];  Service: Cardiovascular;  Laterality: N/A;  . PERCUTANEOUS STENT INTERVENTION  10/11/2014   Procedure: PERCUTANEOUS STENT INTERVENTION;  Surgeon: Lorretta Harp, MD;  Location: [facility] CATH LAB;  Service: Cardiovascular;;  right sfax2  . PERIPHERAL VASCULAR ATHERECTOMY  05/12/2018   Procedure: PERIPHERAL VASCULAR ATHERECTOMY;  Surgeon: Lorretta Harp, MD;  Location: [place];  Service: Cardiovascular;;  left SFA  . PERIPHERAL VASCULAR CATHETERIZATION N/A 01/03/2017   Procedure: Carotid PTA/Stent Intervention / Right;  Surgeon: Lorretta Harp, MD;  Location: [place];  Service: Cardiovascular;  Laterality: N/A;  . PERIPHERAL VASCULAR INTERVENTION Left 02/04/2017   Procedure: Peripheral Vascular Intervention;  Surgeon: Lorretta Harp, MD;  Location: [facility] LAB;  Service: Cardiovascular;  Laterality: Left;  left SFA  . POPLITEAL ARTERY ANGIOPLASTY Left 03/30/2013  . TONSILLECTOMY  1960's  . TOTAL KNEE ARTHROPLASTY Right 05/06/2019   Procedure: TOTAL KNEE ARTHROPLASTY;  Surgeon: Latanya Maudlin, MD;  Location: [facility];  Service: Orthopedics;  Laterality: Right;  166min     Current Meds  Medication Sig  . amLODipine (NORVASC) 10 MG tablet Take 1 tablet by mouth once daily  . aspirin 81 MG tablet Take 81 mg by mouth daily.  [name] atorvastatin (LIPITOR) 40 MG tablet Take 40 mg by mouth every evening.   . Biotin 5000 MCG CAPS Take 5,000 mcg by mouth daily.  . clopidogrel (PLAVIX) 75 MG tablet Take 1 tablet by mouth once daily (Patient taking differently: Take 75 mg by mouth daily. )  . ezetimibe (ZETIA) 10 MG tablet TAKE 1 TABLET BY MOUTH ONCE DAILY  .  ferrous sulfate 325 (65 FE) MG tablet Take 1 tablet (325 mg total) by mouth 2 (two) times daily with a meal.  . irbesartan (AVAPRO) 300 MG tablet TAKE 1 TABLET BY MOUTH ONCE DAILY  . nitroGLYCERIN (NITROSTAT) 0.4 MG SL tablet Place 1 tablet (0.4 mg total) under the tongue every 5 (five) minutes as needed. (Patient taking differently: Place 0.4 mg under the tongue every 5 (five) minutes as needed for chest pain. )  . pantoprazole (PROTONIX) 40 MG tablet Take 1 tablet (40 mg total) by mouth daily.  . tamsulosin (FLOMAX) 0.4 MG CAPS capsule Take 0.4 mg by mouth every other day. AT HS  . tiZANidine (ZANAFLEX) 4 MG tablet Take 1 tablet (4 mg total) by mouth every 6 (six) hours as needed.     Allergies:   Statins   Social History   Tobacco Use  . Smoking status: Former Smoker    Packs/day: 1.00    Years: 58.00    Pack years: 58.00    Types: Cigars, Cigarettes    Quit date: 10/18/2013  Years since quitting: 5.6  . Smokeless tobacco: Never Used  Substance Use Topics  . Alcohol use: Not Currently  . Drug use: Never     Family Hx: The patient's family history includes Healthy in his brother and sister; Heart disease in his father.  ROS:   Please see the history of present illness.    All other systems reviewed and are negative.   Prior CV studies:   The following studies were reviewed today:   Labs/Other Tests and Data Reviewed:    EKG:  No ECG reviewed.  Recent Labs: 04/24/2019: ALT 16 05/08/2019: BUN 20; Creatinine, Ser 1.32; Potassium 4.2; Sodium 135 05/09/2019: Hemoglobin 8.7; Platelets 178   Recent Lipid Panel Lab Results  Component Value Date/Time   CHOL 111 05/07/2019 04:50 AM   CHOL 155 02/03/2018 10:31 AM   TRIG 61 05/07/2019 04:50 AM   HDL 37 (L) 05/07/2019 04:50 AM   HDL 36 (L) 02/03/2018 10:31 AM   CHOLHDL 3.0 05/07/2019 04:50 AM   LDLCALC 62 05/07/2019 04:50 AM   LDLCALC 77 02/03/2018 10:31 AM   LDLDIRECT 97 02/03/2018 10:31 AM   LDLDIRECT 208 (H)  12/12/2016 11:44 AM    Wt Readings from Last 3 Encounters:  05/25/19 151 lb 3.2 oz (68.6 kg)  05/06/19 162 lb 14.4 oz (73.9 kg)  04/24/19 162 lb 14.4 oz (73.9 kg)     Objective:    Vital Signs:  BP 132/75   Ht 5' 7.5" (1.715 m)   Wt 151 lb 3.2 oz (68.6 kg)   SpO2 97%   BMI 23.33 kg/m    VITAL SIGNS:  reviewed  ASSESSMENT & PLAN:    Chest pain post op TKR- MI r/o, echo showed no WMA- no further work up at this time.  CAD- S/p ostial RCA PCI with DES 03/13/2018  PVD- S/p multiple PV procedures, last being Lt SFA PTA June 2019  HTN- Controlled  CRI-3 GFR 53  HLD- LDL 62 05/07/2019  COVID-19 Education: The signs and symptoms of COVID-19 were discussed with the patient and how to seek care for testing (follow up with PCP or arrange E-visit).  The importance of social distancing was discussed today.  Time:   Today, I have spent 15 minutes with the patient with telehealth technology discussing the above problems.     Medication Adjustments/Labs and Tests Ordered: Current medicines are reviewed at length with the patient today.  Concerns regarding medicines are outlined above.   Tests Ordered: No orders of the defined types were placed in this encounter.   Medication Changes: No orders of the defined types were placed in this encounter.   Follow Up:  In Person Dr Gwenlyn Found in Nov  Signed, Anniah Glick, Joshua Wyatt  05/25/2019 11:13 AM    [place]

## 2019-05-25 NOTE — Telephone Encounter (Signed)
VERBAL CONSENT GIVEN TO TERESA FIELDS ON 05/18/2019 AT 9:17AM.     Virtual Visit Pre-Appointment Phone Call  "Joshua Wyatt, I am calling you today to discuss your upcoming appointment. We are currently trying to limit exposure to the virus that causes COVID-19 by seeing patients at home rather than in the office."  1. "What is the BEST phone number to call the day of the visit?" - include this in appointment notes  2. "Do you have or have access to (through a family member/friend) a smartphone with video capability that we can use for your visit?" a. If yes - list this number in appt notes as "cell" (if different from BEST phone #) and list the appointment type as a VIDEO visit in appointment notes b. If no - list the appointment type as a PHONE visit in appointment notes  3. Confirm consent - "In the setting of the current Covid19 crisis, you are scheduled for a (phone or video) visit with your provider on (date) at (time).  Just as we do with many in-office visits, in order for you to participate in this visit, we must obtain consent.  If you'd like, I can send this to your mychart (if signed up) or email for you to review.  Otherwise, I can obtain your verbal consent now.  All virtual visits are billed to your insurance company just like a normal visit would be.  By agreeing to a virtual visit, we'd like you to understand that the technology does not allow for your provider to perform an examination, and thus may limit your provider's ability to fully assess your condition. If your provider identifies any concerns that need to be evaluated in person, we will make arrangements to do so.  Finally, though the technology is pretty good, we cannot assure that it will always work on either your or our end, and in the setting of a video visit, we may have to convert it to a phone-only visit.  In either situation, we cannot ensure that we have a secure connection.  Are you willing to proceed?" STAFF: Did the  patient verbally acknowledge consent to telehealth visit? Document YES/NO here: YES  4. Advise patient to be prepared - "Two hours prior to your appointment, go ahead and check your blood pressure, pulse, oxygen saturation, and your weight (if you have the equipment to check those) and write them all down. When your visit starts, your provider will ask you for this information. If you have an Apple Watch or Kardia device, please plan to have heart rate information ready on the day of your appointment. Please have a pen and paper handy nearby the day of the visit as well."  5. Give patient instructions for MyChart download to smartphone OR Doximity/Doxy.me as below if video visit (depending on what platform provider is using)  6. Inform patient they will receive a phone call 15 minutes prior to their appointment time (may be from unknown caller ID) so they should be prepared to answer    TELEPHONE CALL NOTE  Joshua Wyatt has been deemed a candidate for a follow-up tele-health visit to limit community exposure during the Covid-19 pandemic. I spoke with the patient via phone to ensure availability of phone/video source, confirm preferred email & phone number, and discuss instructions and expectations.  I reminded Joshua Wyatt to be prepared with any vital sign and/or heart rhythm information that could potentially be obtained via home monitoring, at the time of his  visit. I reminded Joshua Wyatt to expect a phone call prior to his visit.  Harold Hedge, CMA 05/25/2019 2:31 PM   INSTRUCTIONS FOR DOWNLOADING THE MYCHART APP TO SMARTPHONE  - The patient must first make sure to have activated MyChart and know their login information - If Apple, go to [employer] and type in MyChart in the search bar and download the app. If Android, ask patient to go to [employer] and type in [place] in the search bar and download the app. The app is free but as with any other app downloads, their phone may  require them to verify saved payment information or Apple/Android password.  - The patient will need to then log into the app with their MyChart username and password, and select [facility] as their healthcare provider to link the account. When it is time for your visit, go to the MyChart app, find appointments, and click Begin Video Visit. Be sure to Select Allow for your device to access the Microphone and Camera for your visit. You will then be connected, and your provider will be with you shortly.  **If they have any issues connecting, or need assistance please contact MyChart service desk [phone])**  **If using a computer, in order to ensure the best quality for their visit they will need to use either of the following Internet Browsers: [employer], or Google Chrome**  IF USING DOXIMITY or DOXY.ME - The patient will receive a link just prior to their visit by text.     FULL LENGTH CONSENT FOR TELE-HEALTH VISIT   I hereby voluntarily request, consent and authorize [facility] and its employed or contracted physicians, physician assistants, nurse practitioners or other licensed health care professionals (the Practitioner), to provide me with telemedicine health care services (the "Services") as deemed necessary by the treating Practitioner. I acknowledge and consent to receive the Services by the Practitioner via telemedicine. I understand that the telemedicine visit will involve communicating with the Practitioner through live audiovisual communication technology and the disclosure of certain medical information by electronic transmission. I acknowledge that I have been given the opportunity to request an in-person assessment or other available alternative prior to the telemedicine visit and am voluntarily participating in the telemedicine visit.  I understand that I have the right to withhold or withdraw my consent to the use of telemedicine in the course of my care at  any time, without affecting my right to future care or treatment, and that the Practitioner or I may terminate the telemedicine visit at any time. I understand that I have the right to inspect all information obtained and/or recorded in the course of the telemedicine visit and may receive copies of available information for a reasonable fee.  I understand that some of the potential risks of receiving the Services via telemedicine include:  [name] Delay or interruption in medical evaluation due to technological equipment failure or disruption; . Information transmitted may not be sufficient (e.g. poor resolution of images) to allow for appropriate medical decision making by the Practitioner; and/or  . In rare instances, security protocols could fail, causing a breach of personal health information.  Furthermore, I acknowledge that it is my responsibility to provide information about my medical history, conditions and care that is complete and accurate to the best of my ability. I acknowledge that Practitioner's advice, recommendations, and/or decision may be based on factors not within their control, such as incomplete or inaccurate data provided by me  or distortions of diagnostic images or specimens that may result from electronic transmissions. I understand that the practice of medicine is not an exact science and that Practitioner makes no warranties or guarantees regarding treatment outcomes. I acknowledge that I will receive a copy of this consent concurrently upon execution via email to the email address I last provided but may also request a printed copy by calling the office of [facility].    I understand that my insurance will be billed for this visit.   I have read or had this consent read to me. . I understand the contents of this consent, which adequately explains the benefits and risks of the Services being provided via telemedicine.  . I have been provided ample opportunity to ask questions  regarding this consent and the Services and have had my questions answered to my satisfaction. . I give my informed consent for the services to be provided through the use of telemedicine in my medical care  By participating in this telemedicine visit I agree to the above.

## 2019-05-29 DIAGNOSIS — M25661 Stiffness of right knee, not elsewhere classified: Secondary | ICD-10-CM | POA: Diagnosis not present

## 2019-06-03 DIAGNOSIS — M25661 Stiffness of right knee, not elsewhere classified: Secondary | ICD-10-CM | POA: Diagnosis not present

## 2019-06-05 DIAGNOSIS — M25661 Stiffness of right knee, not elsewhere classified: Secondary | ICD-10-CM | POA: Diagnosis not present

## 2019-06-09 DIAGNOSIS — R300 Dysuria: Secondary | ICD-10-CM | POA: Diagnosis not present

## 2019-06-09 DIAGNOSIS — M25661 Stiffness of right knee, not elsewhere classified: Secondary | ICD-10-CM | POA: Diagnosis not present

## 2019-06-11 DIAGNOSIS — M25661 Stiffness of right knee, not elsewhere classified: Secondary | ICD-10-CM | POA: Diagnosis not present

## 2019-06-16 DIAGNOSIS — M25661 Stiffness of right knee, not elsewhere classified: Secondary | ICD-10-CM | POA: Diagnosis not present

## 2019-06-18 DIAGNOSIS — M25661 Stiffness of right knee, not elsewhere classified: Secondary | ICD-10-CM | POA: Diagnosis not present

## 2019-07-02 DIAGNOSIS — M25661 Stiffness of right knee, not elsewhere classified: Secondary | ICD-10-CM | POA: Diagnosis not present

## 2019-07-03 DIAGNOSIS — Z471 Aftercare following joint replacement surgery: Secondary | ICD-10-CM | POA: Diagnosis not present

## 2019-07-03 DIAGNOSIS — Z96651 Presence of right artificial knee joint: Secondary | ICD-10-CM | POA: Diagnosis not present

## 2019-07-03 DIAGNOSIS — M25511 Pain in right shoulder: Secondary | ICD-10-CM | POA: Diagnosis not present

## 2019-07-31 DIAGNOSIS — R3915 Urgency of urination: Secondary | ICD-10-CM | POA: Diagnosis not present

## 2019-10-23 ENCOUNTER — Encounter: Payer: Self-pay | Admitting: Cardiovascular Disease

## 2019-10-23 ENCOUNTER — Other Ambulatory Visit: Payer: Self-pay

## 2019-10-23 ENCOUNTER — Ambulatory Visit (INDEPENDENT_AMBULATORY_CARE_PROVIDER_SITE_OTHER): Payer: Medicare Other | Admitting: Cardiovascular Disease

## 2019-10-23 VITALS — BP 104/60 | HR 74 | Temp 97.5°F | Ht 67.5 in | Wt 159.2 lb

## 2019-10-23 DIAGNOSIS — I739 Peripheral vascular disease, unspecified: Secondary | ICD-10-CM

## 2019-10-23 DIAGNOSIS — Z955 Presence of coronary angioplasty implant and graft: Secondary | ICD-10-CM

## 2019-10-23 DIAGNOSIS — I6523 Occlusion and stenosis of bilateral carotid arteries: Secondary | ICD-10-CM | POA: Diagnosis not present

## 2019-10-23 NOTE — Assessment & Plan Note (Signed)
History of CAD status post cardiac catheterization by myself 03/13/2018 setting of accelerated angina revealing high-grade ostial dominant RCA stenosis which I stented using a 2.5 mm x 15 mm long Medtronic resolute Onyx drug-eluting stent stent.  He has had no coronary symptoms since that time.

## 2019-10-23 NOTE — Assessment & Plan Note (Signed)
History of essential hypertension blood pressure measured today 104/60.  He is on amlodipine and Avapro.

## 2019-10-23 NOTE — Assessment & Plan Note (Signed)
History of bilateral carotid endarterectomies performed by Dr. Drucie Opitz in 2007 with subsequent right carotid stenting.  His most recent carotid Dopplers performed 12/12/2018 revealed widely patent right carotid stent with no evidence of ICA stenosis.  This will be repeated on annual basis.

## 2019-10-23 NOTE — Patient Instructions (Signed)
Medication Instructions:  Your physician recommends that you continue on your current medications as directed. Please refer to the Current Medication list given to you today.  If you need a refill on your cardiac medications before your next appointment, please call your pharmacy.   Lab work: NONE  Testing/Procedures: Your physician has requested that you have a carotid duplex in one year. This test is an ultrasound of the carotid arteries in your neck. It looks at blood flow through these arteries that supply the brain with blood. Allow one hour for this exam. There are no restrictions or special instructions.  Your physician has requested that you have a lower extremity arterial exercise duplex with ABI's in one year. During this test, exercise and ultrasound are used to evaluate arterial blood flow in the legs. Allow one hour for this exam. There are no restrictions or special instructions.   Follow-Up: At [facility], you and your health needs are our priority.  As part of our continuing mission to provide you with exceptional heart care, we have created designated Provider Care Teams.  These Care Teams include your primary Cardiologist (physician) and Advanced Practice Providers (APPs -  Physician Assistants and Nurse Practitioners) who all work together to provide you with the care you need, when you need it. You may see [name], MD or one of the following Advanced Practice Providers on your designated Care Team:    Kerin Ransom, PA-C  [place], [place]  Coletta Memos, [place]   Your physician wants you to follow-up in: 1 year after ultrasounds. You will receive a reminder letter in the mail two months in advance. If you don't receive a letter, please call our office to schedule the follow-up appointment.

## 2019-10-23 NOTE — Assessment & Plan Note (Signed)
History of PAD status bilateral SFA intervention remotely by myself as well as right renal artery stenting.  His last peripheral procedure performed by me 05/11/2018 revealed a patent mid left SFA stent with high-grade segmental proximal left SFA stenosis.  I performed [facility] 1 directional arthrectomy followed by drug-coated balloon angioplasty with excellent clinical angiographic result.  Dopplers performed 12/12/2018 revealed this to be widely patent.  He denies claudication.

## 2019-10-23 NOTE — Progress Notes (Signed)
10/23/2019 Joshua Wyatt   1946/10/01  [id number]  Primary Physician Jani Gravel, MD Primary Cardiologist: Lorretta Harp MD FACP, [place], [place], [place]  HPI:  Joshua Wyatt is a 73 y.o.  thin appearing married Caucasian male with no children who I last saw in the  12/17/2018... He has a history of PVOD status post bilateral carotid endarterectomies performed by Dr. Drucie Opitz in 2007. We have been following him by duplex ultrasound which performed most recently several days ago revealed this to be widely patent. He has also had bilateral SFA intervention as well as right renal artery stenting. His other problems include hypertension and hyperlipidemia. He did have an episode of chest heaviness recently, but more importantly he has complained of progressive lifestyle limiting claudication since I last saw him. Most recent lower extremity Dopplers reveal a right ABI of 0.93 and a left of 0.82. He does appear to have moderate iliac disease as well as high grade right SFA and popliteal stenosis. He underwent abdominal aortography with bifemoral runoff on 03/30/13 revealed a widely patent right renal artery stent, 50-60% proximal left renal artery stenosis. The left SFA was while he stayed patent and he had a 95% focal above-the-knee popliteal artery stenosis with three-vessel runoff. There was a 60% focal lesion in the right common iliac artery and a 50% segmental proximal to mid right SFA stenosis with a patent stent in the mid to distal right SFA. He also had a 50% right popliteal stenosis with three-vessel runoff. He underwent cutting balloon angioplasty of the left above-the-knee popliteal artery and Angiosculpt balloon with an excellent angiographic result and was discharged the following day. I performed angiography on him 10/11/14 and recanalized a subtotally occluded right SFA with overlapping via Bahn covered stent performed intervention on his above-the-knee popliteal artery on that side. He had a  patent mid to distal left SFA stent with high grade segmental and 6 sequential disease in the proximal SFA and distal SFA on the left. Since I saw him a year and a half ago he's had progressive lifestyle limiting claudication in his left calf. Addition, he's had severe progression of disease in his right internal carotid artery. I performed carotid stenting on him 01/03/17 with an excellent result. He underwent left lower extremity angiography by myself 02/03/17 revealing an occluded distal left SFA stent with otherwise high-grade segmental disease proximal to this and occluded anterior tibial. I performed cutting balloon atherectomy of his in-stent restenosis and the surrounding disease excellent angiographic result. His claudication has completely resolved. His left ABI has improved from 0.59 up to 1.  Because of recurrent claudication I re-angiogram him 05/11/2018 revealing a patent mid left SFA stent with high-grade segmental proximal left SFA stenosis which I performed [place] 1 directional atherectomy followed by drug-eluting balloon angioplasty.  Claudication resolved and his Dopplers have normalized.  He did have RCA intervention by myself 03/13/2018 with a drug-eluting stent.  He currently denies chest pain, shortness of breath or claudication.  He had elective right left total knee replacement by Dr. Gladstone Lighter in June of this year which was uncomplicated.  Since I saw him back in January 2020 he continues to do well.  He denies chest pain, shortness of breath or claudication.  Dopplers revealed patent carotids and lower extremities as well.    Current Meds  Medication Sig   amLODipine (NORVASC) 10 MG tablet Take 1 tablet by mouth once daily   aspirin 81 MG tablet Take 81 mg  by mouth daily.   atorvastatin (LIPITOR) 40 MG tablet Take 40 mg by mouth every evening.    Biotin 5000 MCG CAPS Take 5,000 mcg by mouth daily.   ezetimibe (ZETIA) 10 MG tablet TAKE 1 TABLET BY MOUTH ONCE DAILY   ferrous  sulfate 325 (65 FE) MG tablet Take 1 tablet (325 mg total) by mouth 2 (two) times daily with a meal.   irbesartan (AVAPRO) 300 MG tablet TAKE 1 TABLET BY MOUTH ONCE DAILY   nitroGLYCERIN (NITROSTAT) 0.4 MG SL tablet Place 1 tablet (0.4 mg total) under the tongue every 5 (five) minutes as needed. (Patient taking differently: Place 0.4 mg under the tongue every 5 (five) minutes as needed for chest pain. )   pantoprazole (PROTONIX) 40 MG tablet Take 1 tablet (40 mg total) by mouth daily.   tamsulosin (FLOMAX) 0.4 MG CAPS capsule Take 0.4 mg by mouth every other day. AT HS   tiZANidine (ZANAFLEX) 4 MG tablet Take 1 tablet (4 mg total) by mouth every 6 (six) hours as needed.   [DISCONTINUED] clopidogrel (PLAVIX) 75 MG tablet Take 1 tablet by mouth once daily (Patient taking differently: Take 75 mg by mouth daily. )     Allergies  Allergen Reactions   Statins Swelling and Other (See Comments)    Muscle pain, also    Social History   Socioeconomic History   Marital status: Married    Spouse name: Not on file   Number of children: Not on file   Years of education: Not on file   Highest education level: Not on file  Occupational History   Not on file  Social Needs   Financial resource strain: Not on file   Food insecurity    Worry: Not on file    Inability: Not on file   Transportation needs    Medical: Not on file    Non-medical: Not on file  Tobacco Use   Smoking status: Former Smoker    Packs/day: 1.00    Years: 58.00    Pack years: 58.00    Types: Cigars, Cigarettes    Quit date: 10/18/2013    Years since quitting: 6.0   Smokeless tobacco: Never Used  Substance and Sexual Activity   Alcohol use: Not Currently   Drug use: Never   Sexual activity: Yes  Lifestyle   Physical activity    Days per week: Not on file    Minutes per session: Not on file   Stress: Not on file  Relationships   Social connections    Talks on phone: Not on file    Gets  together: Not on file    Attends religious service: Not on file    Active member of club or organization: Not on file    Attends meetings of clubs or organizations: Not on file    Relationship status: Not on file   Intimate partner violence    Fear of current or ex partner: Not on file    Emotionally abused: Not on file    Physically abused: Not on file    Forced sexual activity: Not on file  Other Topics Concern   Not on file  Social History Narrative   Not on file     Review of Systems: General: negative for chills, fever, night sweats or weight changes.  Cardiovascular: negative for chest pain, dyspnea on exertion, edema, orthopnea, palpitations, paroxysmal nocturnal dyspnea or shortness of breath Dermatological: negative for rash Respiratory: negative for cough or wheezing  Urologic: negative for hematuria Abdominal: negative for nausea, vomiting, diarrhea, bright red blood per rectum, melena, or hematemesis Neurologic: negative for visual changes, syncope, or dizziness All other systems reviewed and are otherwise negative except as noted above.    Blood pressure 104/60, pulse 74, temperature (!) 97.5 F (36.4 C), height 5' 7.5" (1.715 m), weight 159 lb 3.2 oz (72.2 kg), SpO2 98 %.  General appearance: alert and no distress Neck: no adenopathy, no JVD, supple, symmetrical, trachea midline, thyroid not enlarged, symmetric, no tenderness/mass/nodules and Soft bilateral carotid bruits Lungs: clear to auscultation bilaterally Heart: regular rate and rhythm, S1, S2 normal, no murmur, click, rub or gallop Extremities: extremities normal, atraumatic, no cyanosis or edema Pulses: 2+ and symmetric Skin: Skin color, texture, turgor normal. No rashes or lesions Neurologic: Alert and oriented X 3, normal strength and tone. Normal symmetric reflexes. Normal coordination and gait  EKG not performed today  ASSESSMENT AND PLAN:   PVD (peripheral vascular disease) ([place]) History of  PAD status bilateral SFA intervention remotely by myself as well as right renal artery stenting.  His last peripheral procedure performed by me 05/11/2018 revealed a patent mid left SFA stent with high-grade segmental proximal left SFA stenosis.  I performed [facility] 1 directional arthrectomy followed by drug-coated balloon angioplasty with excellent clinical angiographic result.  Dopplers performed 12/12/2018 revealed this to be widely patent.  He denies claudication.  Mixed hyperlipidemia History of hyperlipidemia on statin therapy as well as Zetia with lipid profile performed 04/29/2019 revealing total cholesterol 111, LDL of 62 and HDL 37  HTN (hypertension) History of essential hypertension blood pressure measured today 104/60.  He is on amlodipine and Avapro.  Bilateral carotid artery disease ([place]) History of bilateral carotid endarterectomies performed by Dr. Drucie Opitz in 2007 with subsequent right carotid stenting.  His most recent carotid Dopplers performed 12/12/2018 revealed widely patent right carotid stent with no evidence of ICA stenosis.  This will be repeated on annual basis.  Status post coronary artery stent placement History of CAD status post cardiac catheterization by myself 03/13/2018 setting of accelerated angina revealing high-grade ostial dominant RCA stenosis which I stented using a 2.5 mm x 15 mm long Medtronic resolute Onyx drug-eluting stent stent.  He has had no coronary symptoms since that time.      Lorretta Harp MD FACP,FACC,FAHA, [facility] 10/23/2019 10:11 AM

## 2019-10-23 NOTE — Assessment & Plan Note (Addendum)
History of hyperlipidemia on statin therapy as well as Zetia with lipid profile performed 04/29/2019 revealing total cholesterol 111, LDL of 62 and HDL 37

## 2019-11-02 DIAGNOSIS — M25562 Pain in left knee: Secondary | ICD-10-CM | POA: Diagnosis not present

## 2019-11-02 DIAGNOSIS — Z96651 Presence of right artificial knee joint: Secondary | ICD-10-CM | POA: Diagnosis not present

## 2019-11-02 DIAGNOSIS — Z471 Aftercare following joint replacement surgery: Secondary | ICD-10-CM | POA: Diagnosis not present

## 2019-11-10 ENCOUNTER — Other Ambulatory Visit: Payer: Self-pay | Admitting: Cardiovascular Disease

## 2019-11-26 ENCOUNTER — Other Ambulatory Visit: Payer: Self-pay | Admitting: Internal Medicine

## 2019-12-15 ENCOUNTER — Other Ambulatory Visit: Payer: Self-pay | Admitting: Cardiovascular Disease

## 2019-12-30 DIAGNOSIS — L218 Other seborrheic dermatitis: Secondary | ICD-10-CM | POA: Diagnosis not present

## 2020-01-25 ENCOUNTER — Other Ambulatory Visit: Payer: Self-pay | Admitting: Cardiovascular Disease

## 2020-02-08 ENCOUNTER — Other Ambulatory Visit: Payer: Self-pay | Admitting: Cardiovascular Disease

## 2020-02-09 ENCOUNTER — Other Ambulatory Visit: Payer: Self-pay

## 2020-02-09 MED ORDER — IRBESARTAN 300 MG PO TABS: 300.0000 mg | ORAL_TABLET | Freq: Every day | ORAL | 9 refills | Status: DC

## 2020-02-29 ENCOUNTER — Telehealth: Payer: Self-pay | Admitting: Cardiovascular Disease

## 2020-02-29 MED ORDER — AMLODIPINE BESYLATE 10 MG PO TABS: 5.0000 mg | ORAL_TABLET | Freq: Every day | ORAL | 3 refills | Status: DC

## 2020-02-29 NOTE — Telephone Encounter (Signed)
Patient aware of recommendations.  Med list updated and advised to monitor BP.  Patient verbalized understanding.

## 2020-02-29 NOTE — Telephone Encounter (Signed)
Returned call to patient-reports concern of his blood pressure running low.   States today it was 99/82 and 100/62.  States yesterday 37-95 systolic HR 81.   States he feels like he has no energy, denies dizziness, lightheadedness.    He is currently taking amlodipine 10 mg daily and irbesartan 300 mg daily.  He is wondering if medication can be decreased.   Advised would route to pharmD for review and recommendations. Patient aware and verbalized understanding.

## 2020-02-29 NOTE — Telephone Encounter (Signed)
We don't worry about this blood pressure but if patient is having symptoms; okay to decrease amlodipine from $RemoveBeforeD'10mg'RcBqBFZMqufxSD$  to $R'5mg'Oj$  daily.

## 2020-02-29 NOTE — Telephone Encounter (Signed)
New message   Pt c/o BP issue: STAT if pt c/o blurred vision, one-sided weakness or slurred speech  1. What are your last 5 BP readings? 96/82 95/82  2. Are you having any other symptoms (ex. Dizziness, headache, blurred vision, passed out)? tired  3. What is your BP issue? Patient states that his b/p is low.

## 2020-03-14 ENCOUNTER — Other Ambulatory Visit: Payer: Self-pay | Admitting: Cardiovascular Disease

## 2020-03-15 NOTE — Telephone Encounter (Signed)
Rx request sent to pharmacy.  

## 2020-04-20 ENCOUNTER — Telehealth: Payer: Self-pay | Admitting: Cardiovascular Disease

## 2020-04-20 DIAGNOSIS — E785 Hyperlipidemia, unspecified: Secondary | ICD-10-CM

## 2020-04-20 NOTE — Telephone Encounter (Signed)
New message   Patient states that his medication atorvastatin (LIPITOR) 40 MG tablet he is having pain in joints. Please advise.

## 2020-04-20 NOTE — Telephone Encounter (Signed)
Pt states that he has had joint pain for the past few months that has gotten progressively worse. He talked to his pharmacist where he gets his medications at and the pharmacist told him it was probably his atorvastatin that was causing the joint pain. The patient states that he does not want to continue taking this medication and would like an alternative if possible.

## 2020-04-21 NOTE — Telephone Encounter (Signed)
Have him see one of our Pharm Ds to consider starting Repatha

## 2020-04-22 NOTE — Telephone Encounter (Signed)
Pt advised and agreed to referral to the [facility] for his other options for Lipid treatment such as possibly Repatha.

## 2020-04-25 ENCOUNTER — Other Ambulatory Visit: Payer: Self-pay

## 2020-04-25 ENCOUNTER — Ambulatory Visit (INDEPENDENT_AMBULATORY_CARE_PROVIDER_SITE_OTHER): Payer: Medicare Other | Admitting: Pharmacist Clinician (PhC)/ Clinical Pharmacy Specialist

## 2020-04-25 VITALS — BP 150/80 | HR 73 | Temp 98.0°F | Resp 15 | Ht 67.0 in | Wt 163.4 lb

## 2020-04-25 DIAGNOSIS — E782 Mixed hyperlipidemia: Secondary | ICD-10-CM | POA: Diagnosis not present

## 2020-04-25 NOTE — Assessment & Plan Note (Signed)
Patient with mixed hyperlipidemia, currently on atorvastatin and ezetimibe.  His most recent labs are 64 months old and will need to be repeated.  If his LDL is still < 70, will have to repeat labs in another month, as he is stopping the atorvastatin today.  Reviewed options for lowering LDL cholesterol, including PCSK-9 inhibitors and bempedoic acid.  Discussed mechanisms of action, dosing, side effects and potential decreases in LDL cholesterol.  Answered all patient questions.  Based on this information, patient would prefer to start [place] Pushtronix.  Once we get labs will start the paperwork to get approval from his insurer.  He was also set up with the [place] in the office today, as he has a limited income.

## 2020-04-25 NOTE — Progress Notes (Signed)
04/25/2020 Joshua Wyatt June 13, 1946 [id number]   HPI:  Joshua Wyatt is a 74 y.o. male patient of Dr Gwenlyn Found, who presents today for a lipid clinic evaluation.  See pertinent past medical history below.  He was last seen by Dr. Gwenlyn Found in November and has a complex history of ASCVD/PVD.   He was on atorvastatin 40 mg with ezetimibe 10 mg daily for several years,  however earlier this month he called in reporting a progressive worsening of joint pain.  He was told by his pharmacist that this was most likely due to the atorvastatin, and he called into the office to see if medication could be changed.  He continues to take the atorvastatin, but would like to stop.    He is in the office today to discuss options.  He has not had lipid labs drawn in a year.  He does not like the idea of the [employer], as he does not want to use any needles.     Past Medical History: ASCVD S/p PCI to RCA  PVD Bilateral carotid endarterectomies 2007; bilateral SFA stents, right renal artery stent  hypertension Controlled on irbesartan and amlodipine  CKD Stage 3 GFR 53, CrCl 42  GERD On protonix 40 qd    Current Medications:  Atorvastatin 40 mg and ezetimibe 10 mg  Cholesterol Goals: LDL < 70   Intolerant/previously tried: lovastatin - poor results  Family history: father with CABG at 30, died at that time; mother died at 26, no known heart issues; siblings all healthy (brother Building control surveyor with Nature conservation officer);   Diet: has gained 10 pounds since Covid, no eating out;  Wife has feeding tube,  so he does all the cooking and eats what he wants; no added salts; lost of sandwiches;   Exercise:  walks about 1/2 mile every other day  Labs:  04/2019: Na 135, K 4.2, Glu 102, BUN 20, SCr 1.32 GFR 53 CrCl 42  04/2019; TC 111, TG 61, HDL 37, LDL 62  Current Outpatient Medications  Medication Sig Dispense Refill  . amLODipine (NORVASC) 10 MG tablet Take 0.5 tablets (5 mg total) by mouth daily. 90 tablet 3  . aspirin 81 MG  tablet Take 81 mg by mouth daily.    [name] atorvastatin (LIPITOR) 40 MG tablet Take 1 tablet by mouth once daily 30 tablet 8  . Biotin 5000 MCG CAPS Take 5,000 mcg by mouth daily.    [name] ezetimibe (ZETIA) 10 MG tablet Take 1 tablet by mouth once daily 90 tablet 0  . irbesartan (AVAPRO) 300 MG tablet Take 1 tablet (300 mg total) by mouth daily. 30 tablet 9  . nitroGLYCERIN (NITROSTAT) 0.4 MG SL tablet Place 1 tablet (0.4 mg total) under the tongue every 5 (five) minutes as needed. (Patient taking differently: Place 0.4 mg under the tongue every 5 (five) minutes as needed for chest pain. ) 25 tablet 12  . pantoprazole (PROTONIX) 40 MG tablet Take 1 tablet by mouth once daily 90 tablet 0  . tamsulosin (FLOMAX) 0.4 MG CAPS capsule Take 0.4 mg by mouth every other day. AT HS    . tiZANidine (ZANAFLEX) 4 MG tablet Take 1 tablet (4 mg total) by mouth every 6 (six) hours as needed. 40 tablet 1   No current facility-administered medications for this visit.    Allergies  Allergen Reactions  . Statins Swelling and Other (See Comments)    Muscle pain, also    Past Medical History:  Diagnosis Date  .  Arthritis    "LITTLE IN MY LEFT HAND" (05/12/2018)  . Bilateral calf pain 03-02-2013   lower ext.doppler-mild arterial insufficiency to lower ext. this is a disease in val  . CAD (coronary artery disease)    a. cath 02/24/18 - 90% ost RCA; 40% ost & pro LAD  . Cerebral atherosclerosis 03-02-2013   carotid duplex- normal study  . Deviated septum    RIGHT  . GERD (gastroesophageal reflux disease)   . History of echocardiogram 08-02-2005   normal study   . History of kidney stones   . Hyperlipemia   . Hypertension 03-30-2013   PV Angiogram- rt. renal artery stent was widely open,50-60% lt. renal artery stenosis,lt. SFA stents patent with high grade above the knee  poplital stenosis  . Hyponatremia 02/03/2017  . Normal cardiac stress test 03-25-2013   EF 60%, normal stress test ,this is a presurgical  test  .  Peripheral vascular disease ([place])   . PVD (peripheral vascular disease) ([place])    A. 2009: S/P PTA/stent to D SFA. B. 01/2017: angiosculpt atherectomy/drug-eluting balloon angioplasty to L SFA.  [name] Renal artery stenosis ([place]) 03-02-2013   renal artery doppler-this was an abnormal doppler    Blood pressure (!) 150/80, pulse 73, temperature 98 F (36.7 C), resp. rate 15, height $RemoveBe'5\' 7"'vufTMHOLS$  (1.702 m), weight 163 lb 6.4 oz (74.1 kg), SpO2 95 %.   Mixed hyperlipidemia Patient with mixed hyperlipidemia, currently on atorvastatin and ezetimibe.  His most recent labs are 88 months old and will need to be repeated.  If his LDL is still < 70, will have to repeat labs in another month, as he is stopping the atorvastatin today.  Reviewed options for lowering LDL cholesterol, including PCSK-9 inhibitors and bempedoic acid.  Discussed mechanisms of action, dosing, side effects and potential decreases in LDL cholesterol.  Answered all patient questions.  Based on this information, patient would prefer to start [place] Pushtronix.  Once we get labs will start the paperwork to get approval from his insurer.  He was also set up with the [place] in the office today, as he has a limited income.     Tommy Medal PharmD CPP [facility] [address] [place], [place] [address] [phone]

## 2020-04-25 NOTE — Patient Instructions (Addendum)
Your Results:             Your most recent labs Goal  Total Cholesterol 111 < 200  Triglycerides 61 <150  HDL (happy/good cholesterol) 37 > 40  LDL (lousy/bad cholesterol) 62 < 70     Medication changes:  Once we get your labs back we can start the process to get Repatha Pushtronix covered by your insurance.  After it's approved take your card from the [facility] to the pharmacy.  It will pay your co-pay for Repatha AND ezetimibe.  Patient Assistance:  The [employer] foundation offers assistance to help pay for medication copays.  They will cover copays for all cholesterol lowering meds, including statins, fibrates, omega-3 oils, ezetimibe, Repatha, Praluent, Nexletol, Nexlizet.  The cards are usually good for $2,500 or 12 months, whichever comes first. 1. Go to healthwellfoundation.org 2. Click on "Apply Now" 3. Answer questions as to whom is applying (patient or representative) 4. Your disease fund will be "hypercholesterolemia - Medicare access" 5. They will ask questions about finances and which medications you are taking for cholesterol 6. When you submit, the approval is usually within minutes.  You will need to print the card information from the site 7. You will need to show this information to your pharmacy, they will bill your Medicare Part D plan first -then bill [employer] Well --for the copay.   You can also call them at [phone], although the hold times can be quite long.   Thank you for choosing [facility]

## 2020-04-26 LAB — HEPATIC FUNCTION PANEL
ALT: 9 IU/L (ref 0–44)
AST: 17 IU/L (ref 0–40)
Albumin: 4.6 g/dL (ref 3.7–4.7)
Alkaline Phosphatase: 189 IU/L — ABNORMAL HIGH (ref 48–121)
Bilirubin Total: 0.6 mg/dL (ref 0.0–1.2)
Bilirubin, Direct: 0.16 mg/dL (ref 0.00–0.40)
Total Protein: 7 g/dL (ref 6.0–8.5)

## 2020-04-26 LAB — LIPID PANEL
Chol/HDL Ratio: 3.8 ratio (ref 0.0–5.0)
Cholesterol, Total: 128 mg/dL (ref 100–199)
HDL: 34 mg/dL — ABNORMAL LOW (ref >39)
LDL Chol Calc (NIH): 67 mg/dL (ref 0–99)
Triglycerides: 157 mg/dL — ABNORMAL HIGH (ref 0–149)
VLDL Cholesterol Cal: 27 mg/dL (ref 5–40)

## 2020-04-27 NOTE — Telephone Encounter (Signed)
Already established with the lipid clinic

## 2020-05-03 ENCOUNTER — Telehealth: Payer: Self-pay | Admitting: Cardiovascular Disease

## 2020-05-03 NOTE — Telephone Encounter (Signed)
Patient states he is returning a call from today. I did not see a note.

## 2020-05-03 NOTE — Telephone Encounter (Signed)
Spoke with pt, he reports he had a call about a new cholesterol medication. Will forward to pharm md.

## 2020-05-04 ENCOUNTER — Telehealth: Payer: Self-pay

## 2020-05-04 MED ORDER — REPATHA PUSHTRONEX SYSTEM 420 MG/3.5ML ~~LOC~~ SOCT: 420.0000 mg | SUBCUTANEOUS | 12 refills | Status: DC

## 2020-05-04 NOTE — Telephone Encounter (Signed)
Called and spoke w/pt regarding the approval of repatha pushtronix, rx sent, pt instructed to call back if unaffordable

## 2020-05-04 NOTE — Telephone Encounter (Signed)
lmomed the pt that we are currently working on a pa for repatha pushtronix

## 2020-05-10 ENCOUNTER — Other Ambulatory Visit: Payer: Self-pay | Admitting: Cardiovascular Disease

## 2020-05-12 ENCOUNTER — Other Ambulatory Visit: Payer: Self-pay | Admitting: Cardiovascular Disease

## 2020-05-12 MED ORDER — EZETIMIBE 10 MG PO TABS: 10.0000 mg | ORAL_TABLET | Freq: Every day | ORAL | 0 refills | Status: DC

## 2020-05-12 NOTE — Telephone Encounter (Signed)
*  STAT* If patient is at the pharmacy, call can be transferred to refill team.   1. Which medications need to be refilled? (please list name of each medication and dose if known) ezetimibe (ZETIA) 10 MG tablet  2. Which pharmacy/location (including street and city if local pharmacy) is medication to be sent to? [facility], [place] - [address]  3. Do they need a 30 day or 90 day supply? 90 day supply

## 2020-05-13 ENCOUNTER — Telehealth: Payer: Self-pay | Admitting: Cardiovascular Disease

## 2020-05-13 NOTE — Telephone Encounter (Signed)
Connected call to [facility] in the coumadin clinic to assist with Repatha

## 2020-06-12 ENCOUNTER — Other Ambulatory Visit: Payer: Self-pay | Admitting: Cardiovascular Disease

## 2020-07-01 ENCOUNTER — Other Ambulatory Visit: Payer: Self-pay | Admitting: Cardiovascular Disease

## 2020-07-08 DIAGNOSIS — M25512 Pain in left shoulder: Secondary | ICD-10-CM | POA: Diagnosis not present

## 2020-07-08 DIAGNOSIS — Z96651 Presence of right artificial knee joint: Secondary | ICD-10-CM | POA: Diagnosis not present

## 2020-08-01 ENCOUNTER — Telehealth: Payer: Self-pay | Admitting: Cardiovascular Disease

## 2020-08-01 NOTE — Telephone Encounter (Signed)
Cut irbesartan to 150  mg

## 2020-08-01 NOTE — Telephone Encounter (Signed)
° °  Pt c/o BP issue: STAT if pt c/o blurred vision, one-sided weakness or slurred speech  1. What are your last 5 BP readings? 90/50  2. Are you having any other symptoms (ex. Dizziness, headache, blurred vision, passed out)? A little bit of dizziness  3. What is your BP issue? Pt said his BP been low since friday

## 2020-08-01 NOTE — Telephone Encounter (Addendum)
Returned the call to the patient. He stated that since Friday his blood pressures have been running in the 90/50's with heart rates in the 70's. He normally checks his blood pressure in the afternoon. He has been asymptomatic except for occasional dizziness when he stands up. He has been advised to rise slowly from a sitting position.   While on the phone his blood pressure was 118/62. He wanted to know if he should hold his Irbesartan tonight. He has been advised to recheck his blood pressure before taking it and if it is low he may take half the dose.   He currently takes: Amlodipine 10 mg once daily in the morning Irbesartan 300 mg once daily in the evening,after dinner

## 2020-08-02 ENCOUNTER — Telehealth: Payer: Self-pay | Admitting: Cardiovascular Disease

## 2020-08-02 MED ORDER — IRBESARTAN 150 MG PO TABS: 150.0000 mg | ORAL_TABLET | Freq: Every day | ORAL | 5 refills | Status: DC

## 2020-08-02 NOTE — Telephone Encounter (Signed)
The patient has been made aware. He will keep track of his blood pressure and call of [place] back if the reduction does not help.

## 2020-08-02 NOTE — Telephone Encounter (Signed)
      I went in pt's chart to see who called him this morning. I transferred the call to Kathryne Sharper.

## 2020-08-02 NOTE — Telephone Encounter (Signed)
Left a message for the patient to call back.  

## 2020-08-04 ENCOUNTER — Other Ambulatory Visit: Payer: Self-pay | Admitting: Cardiovascular Disease

## 2020-08-04 NOTE — Telephone Encounter (Signed)
Rx has been sent to the pharmacy electronically. ° °

## 2020-08-29 ENCOUNTER — Telehealth: Payer: Self-pay | Admitting: Cardiovascular Disease

## 2020-08-29 NOTE — Telephone Encounter (Signed)
Pt c/o BP issue: STAT if pt c/o blurred vision, one-sided weakness or slurred speech  1. What are your last 5 BP readings? 96/50 96/60 for the last week  2. Are you having any other symptoms (ex. Dizziness, headache, blurred vision, passed out)? Sweats a little and tired  3. What is your BP issue?blood pressure running low

## 2020-08-29 NOTE — Telephone Encounter (Signed)
Please decrease amlodipine dose to $Remov'5mg'fxvCVL$  daily. This should help with low blood pressure but will not affect pulse (hear rate).  Please continue to monitor blood pressure twice daily and schedule appointment for assessment by cardiologist.

## 2020-08-29 NOTE — Telephone Encounter (Signed)
I spoke with Joshua Wyatt and he reports that the reduction in Irbesartan to 150 mg daily on 08/01/20 has not helped.  His bp has been has been running in the 50D systolic and 32-67 diastolic, and his most recent pulse rate was 59.  He feels like he could pass out with position change going from sit to stand. HIs last onsite visit with Dr Gwenlyn Found was Oct 23 2019 and he saw pharmacy 04/25/20. He does not currently have an appt scheduled. He reports that his wife has been very ill.  Please advise.

## 2020-08-29 NOTE — Telephone Encounter (Signed)
I notified Mr Wichert and gave the recommendations and noted on med list the change in dose of amlodipine.

## 2020-09-05 ENCOUNTER — Telehealth: Payer: Self-pay | Admitting: Cardiovascular Disease

## 2020-09-05 MED ORDER — NITROGLYCERIN 0.4 MG SL SUBL: 0.4000 mg | SUBLINGUAL_TABLET | SUBLINGUAL | 5 refills | Status: DC | PRN

## 2020-09-05 NOTE — Telephone Encounter (Signed)
Returned call to patient. He was asking for a refill for his NTG. Denies having any chest pain, but states that he just needed an updated Rx. Refills sent in. Patient scheduled for his yearly f/u.  Patient also wanting to let Dr. Gwenlyn Found know that his wife passed away this morning. Sympathy card has been placed in the mail.

## 2020-09-05 NOTE — Telephone Encounter (Signed)
Joshua Wyatt is calling requesting to speak with a nurse in regards to some questions about his nitroglycerin. Please advise.

## 2020-09-11 ENCOUNTER — Other Ambulatory Visit: Payer: Self-pay | Admitting: Cardiovascular Disease

## 2020-09-30 DIAGNOSIS — R0781 Pleurodynia: Secondary | ICD-10-CM | POA: Diagnosis not present

## 2020-09-30 DIAGNOSIS — W19XXXA Unspecified fall, initial encounter: Secondary | ICD-10-CM | POA: Diagnosis not present

## 2020-09-30 DIAGNOSIS — R109 Unspecified abdominal pain: Secondary | ICD-10-CM | POA: Diagnosis not present

## 2020-09-30 DIAGNOSIS — M19011 Primary osteoarthritis, right shoulder: Secondary | ICD-10-CM | POA: Diagnosis not present

## 2020-10-18 ENCOUNTER — Encounter (HOSPITAL_COMMUNITY): Payer: Medicare Other

## 2020-10-18 ENCOUNTER — Ambulatory Visit (HOSPITAL_COMMUNITY)
Admission: RE | Admit: 2020-10-18 | Payer: Medicare Other | Source: Ambulatory Visit | Attending: Cardiovascular Disease | Admitting: Cardiovascular Disease

## 2020-10-20 ENCOUNTER — Other Ambulatory Visit: Payer: Self-pay | Admitting: Cardiovascular Disease

## 2020-10-28 ENCOUNTER — Encounter: Payer: Self-pay | Admitting: Cardiovascular Disease

## 2020-10-28 ENCOUNTER — Ambulatory Visit (INDEPENDENT_AMBULATORY_CARE_PROVIDER_SITE_OTHER): Payer: Medicare Other | Admitting: Cardiovascular Disease

## 2020-10-28 ENCOUNTER — Other Ambulatory Visit: Payer: Self-pay

## 2020-10-28 VITALS — BP 114/58 | HR 69 | Ht 67.5 in | Wt 165.0 lb

## 2020-10-28 DIAGNOSIS — I2510 Atherosclerotic heart disease of native coronary artery without angina pectoris: Secondary | ICD-10-CM | POA: Diagnosis not present

## 2020-10-28 DIAGNOSIS — I10 Essential (primary) hypertension: Secondary | ICD-10-CM

## 2020-10-28 DIAGNOSIS — I6523 Occlusion and stenosis of bilateral carotid arteries: Secondary | ICD-10-CM | POA: Diagnosis not present

## 2020-10-28 DIAGNOSIS — Z9861 Coronary angioplasty status: Secondary | ICD-10-CM

## 2020-10-28 DIAGNOSIS — I739 Peripheral vascular disease, unspecified: Secondary | ICD-10-CM

## 2020-10-28 DIAGNOSIS — E782 Mixed hyperlipidemia: Secondary | ICD-10-CM

## 2020-10-28 NOTE — Assessment & Plan Note (Signed)
History of essential hypertension a blood pressure measured today 114/58.  He is on Avapro and amlodipine.

## 2020-10-28 NOTE — Progress Notes (Signed)
10/28/2020 Joshua Wyatt   [birth year]  [id number]  Primary Physician Jani Gravel, MD Primary Cardiologist: Lorretta Harp MD FACP, [place], [place], [place]  HPI:  Joshua Wyatt is a 74 y.o.  thin appearing married Caucasian male with no children who I last saw in the  office 10/23/2019.  Unfortunately, his wife of 24 years died at the age of 63 on 09-Oct-2020.  He is currently living alone.  He has a history of PVOD status post bilateral carotid endarterectomies performed by Dr. Drucie Opitz in 2007. We have been following him by duplex ultrasound which performed most recently several days ago revealed this to be widely patent. He has also had bilateral SFA intervention as well as right renal artery stenting. His other problems include hypertension and hyperlipidemia. He did have an episode of chest heaviness recently, but more importantly he has complained of progressive lifestyle limiting claudication since I last saw him. Most recent lower extremity Dopplers reveal a right ABI of 0.93 and a left of 0.82. He does appear to have moderate iliac disease as well as high grade right SFA and popliteal stenosis. He underwent abdominal aortography with bifemoral runoff on 03/30/13 revealed a widely patent right renal artery stent, 50-60% proximal left renal artery stenosis. The left SFA was while he stayed patent and he had a 95% focal above-the-knee popliteal artery stenosis with three-vessel runoff. There was a 60% focal lesion in the right common iliac artery and a 50% segmental proximal to mid right SFA stenosis with a patent stent in the mid to distal right SFA. He also had a 50% right popliteal stenosis with three-vessel runoff. He underwent cutting balloon angioplasty of the left above-the-knee popliteal artery and Angiosculpt balloon with an excellent angiographic result and was discharged the following day. I performed angiography on him 10/11/14 and recanalized a subtotally occluded right SFA with  overlapping via Bahn covered stent performed intervention on his above-the-knee popliteal artery on that side. He had a patent mid to distal left SFA stent with high grade segmental and 6 sequential disease in the proximal SFA and distal SFA on the left. Since I saw him a year and a half ago he's had progressive lifestyle limiting claudication in his left calf. Addition, he's had severe progression of disease in his right internal carotid artery. I performed carotid stenting on him 01/03/17 with an excellent result. He underwent left lower extremity angiography by myself 02/03/17 revealing an occluded distal left SFA stent with otherwise high-grade segmental disease proximal to this and occluded anterior tibial. I performed cutting balloon atherectomy of his in-stent restenosis and the surrounding disease excellent angiographic result. His claudication has completely resolved. His left ABI has improved from 0.59 up to1.  Because of recurrent claudication I re-angiogram him 05/11/2018 revealing a patent mid left SFA stent with high-grade segmental proximal left SFA stenosis which I performed [place] 1 directional atherectomy followed by drug-eluting balloon angioplasty. Claudication resolved and his Dopplers have normalized. He did have RCA intervention by myself 03/13/2018 with a drug-eluting stent. He currently denies chest pain, shortness of breath or claudication. He had elective right left total knee replacement by Dr. Gladstone Lighter in June of this year which was uncomplicated.  Since I saw him a year ago he continues to do well.  He denies chest pain, shortness of breath or claudication.  His Dopplers of his lower extremities and carotids continue to show patency.  Unfortunately, he lost his wife of 24 years on 10-09-20.  Current Meds  Medication Sig  . amLODipine (NORVASC) 10 MG tablet Take 1 tablet by mouth once daily  . aspirin 81 MG tablet Take 81 mg by mouth daily.  . Evolocumab with Infusor (Tracy) 420 MG/3.5ML SOCT Inject 420 mg into the skin every 30 (thirty) days.  [name] ezetimibe (ZETIA) 10 MG tablet Take 1 tablet by mouth once daily (Patient taking differently: Take 5 mg by mouth every morning. )  . irbesartan (AVAPRO) 150 MG tablet Take 1 tablet (150 mg total) by mouth daily.  . nitroGLYCERIN (NITROSTAT) 0.4 MG SL tablet Place 1 tablet (0.4 mg total) under the tongue every 5 (five) minutes as needed for chest pain.  . pantoprazole (PROTONIX) 40 MG tablet Take 1 tablet by mouth once daily  . tamsulosin (FLOMAX) 0.4 MG CAPS capsule Take 0.4 mg by mouth every other day. AT HS  . [DISCONTINUED] atorvastatin (LIPITOR) 40 MG tablet Take 1 tablet by mouth once daily  . [DISCONTINUED] Biotin 5000 MCG CAPS Take 5,000 mcg by mouth daily.  . [DISCONTINUED] tiZANidine (ZANAFLEX) 4 MG tablet Take 1 tablet (4 mg total) by mouth every 6 (six) hours as needed.     Allergies  Allergen Reactions  . Statins Swelling and Other (See Comments)    Muscle pain, also    Social History   Socioeconomic History  . Marital status: Married    Spouse name: Not on file  . Number of children: Not on file  . Years of education: Not on file  . Highest education level: Not on file  Occupational History  . Not on file  Tobacco Use  . Smoking status: Former Smoker    Packs/day: 1.00    Years: 58.00    Pack years: 58.00    Types: Cigars, Cigarettes    Quit date: 10/18/2013    Years since quitting: 7.0  . Smokeless tobacco: Never Used  Vaping Use  . Vaping Use: Never used  Substance and Sexual Activity  . Alcohol use: Not Currently  . Drug use: Never  . Sexual activity: Yes  Other Topics Concern  . Not on file  Social History Narrative  . Not on file   Social Determinants of Health   Financial Resource Strain:   . Difficulty of Paying Living Expenses: Not on file  Food Insecurity:   . Worried About Charity fundraiser in the Last Year: Not on file  . Ran Out of Food in the Last  Year: Not on file  Transportation Needs:   . Lack of Transportation (Medical): Not on file  . Lack of Transportation (Non-Medical): Not on file  Physical Activity:   . Days of Exercise per Week: Not on file  . Minutes of Exercise per Session: Not on file  Stress:   . Feeling of Stress : Not on file  Social Connections:   . Frequency of Communication with Friends and Family: Not on file  . Frequency of Social Gatherings with Friends and Family: Not on file  . Attends Religious Services: Not on file  . Active Member of Clubs or Organizations: Not on file  . Attends Archivist Meetings: Not on file  . Marital Status: Not on file  Intimate Partner Violence:   . Fear of Current or Ex-Partner: Not on file  . Emotionally Abused: Not on file  . Physically Abused: Not on file  . Sexually Abused: Not on file     Review of Systems: General: negative for  chills, fever, night sweats or weight changes.  Cardiovascular: negative for chest pain, dyspnea on exertion, edema, orthopnea, palpitations, paroxysmal nocturnal dyspnea or shortness of breath Dermatological: negative for rash Respiratory: negative for cough or wheezing Urologic: negative for hematuria Abdominal: negative for nausea, vomiting, diarrhea, bright red blood per rectum, melena, or hematemesis Neurologic: negative for visual changes, syncope, or dizziness All other systems reviewed and are otherwise negative except as noted above.    Blood pressure (!) 114/58, pulse 69, height 5' 7.5" (1.715 m), weight 165 lb (74.8 kg).  General appearance: alert and no distress Neck: no adenopathy, no JVD, supple, symmetrical, trachea midline, thyroid not enlarged, symmetric, no tenderness/mass/nodules and Bilateral carotid bruits Lungs: clear to auscultation bilaterally Heart: regular rate and rhythm, S1, S2 normal, no murmur, click, rub or gallop Extremities: extremities normal, atraumatic, no cyanosis or edema Pulses: 2+ and  symmetric Skin: Skin color, texture, turgor normal. No rashes or lesions Neurologic: Alert and oriented X 3, normal strength and tone. Normal symmetric reflexes. Normal coordination and gait  EKG sinus rhythm at 69 without ST or T wave changes.  I personally reviewed this EKG.  ASSESSMENT AND PLAN:   PVD (peripheral vascular disease) ([place]) History of peripheral arterial disease status post multiple interventions in bilateral lower extremities.  His last intervention was 05/11/2018.  Had a patent left SFA stent with high-grade segmental proximal left SFA stenosis.  I performed [facility] 1 directional atherectomy followed by drug-coated balloon angioplasty with excellent result.  His last Dopplers performed 12/12/2018 showed normal ABIs bilaterally with a widely patent left SFA.  He denies claudication.  We will repeat lower extremity arterial Doppler studies.  Mixed hyperlipidemia History of hyperlipidemia on Zetia and Repatha with lipid profile performed 04/25/2020 revealing a total cholesterol of 128, LDL 67 and HDL 34.  HTN (hypertension) History of essential hypertension a blood pressure measured today 114/58.  He is on Avapro and amlodipine.  Bilateral carotid artery disease ([place]) History of bilateral carotid endarterectomies performed by Dr. Amedeo Plenty in 2007.  I performed right internal carotid artery stenting 01/03/2017 because of progression of disease in his right carotid endarterectomy site.  We will repeat carotid Doppler studies.  CAD S/P percutaneous coronary angioplasty History of CAD status post RCA PCI and stenting 03/13/2018.  He is totally asymptomatic.      Lorretta Harp MD FACP,FACC,FAHA, [facility] 10/28/2020 2:53 PM

## 2020-10-28 NOTE — Patient Instructions (Addendum)
Medication Instructions:  No changes *If you need a refill on your cardiac medications before your next appointment, please call your pharmacy*   Lab Work: None ordered If you have labs (blood work) drawn today and your tests are completely normal, you will receive your results only by: [name] MyChart Message (if you have MyChart) OR . A paper copy in the mail If you have any lab test that is abnormal or we need to change your treatment, we will call you to review the results.   Testing/Procedures: Keep your appointment for the Carotid and Lower Extremity duplex on 11/08/20  Your physician has requested that you have a renal artery duplex. During this test, an ultrasound is used to evaluate blood flow to the kidneys. Take your medications as you usually do. This will take place at [address], [address].   No food after 11PM the night before.  Water is OK. (Don't drink liquids if you have been instructed not to for ANOTHER test).  Take two Extra-Strength Gas-X capsules at bedtime the night before test.   Take an additional two Extra-Strength Gas-X capsules three (3) hours before the test or first thing in the morning.    Avoid foods that produce bowel gas, for 24 hours prior to exam (see below).    No breakfast, no chewing gum, no smoking or carbonated beverages.  Patient may take morning medications with water.  Come in for test at least 15 minutes early to register.   Follow-Up: At [facility], you and your health needs are our priority.  As part of our continuing mission to provide you with exceptional heart care, we have created designated Provider Care Teams.  These Care Teams include your primary Cardiologist (physician) and Advanced Practice Providers (APPs -  Physician Assistants and Nurse Practitioners) who all work together to provide you with the care you need, when you need it.  We recommend signing up for the patient portal called "MyChart".  Sign up information  is provided on this After Visit Summary.  MyChart is used to connect with patients for Virtual Visits (Telemedicine).  Patients are able to view lab/test results, encounter notes, upcoming appointments, etc.  Non-urgent messages can be sent to your provider as well.   To learn more about what you can do with MyChart, go to [email or web address].    Your next appointment:   12 month(s)  The format for your next appointment:   In Person  Provider:   You may see [name], MD or one of the following Advanced Practice Providers on your designated Care Team:    Kerin Ransom, PA-C  [place], [place]  Coletta Memos, [place]

## 2020-10-28 NOTE — Assessment & Plan Note (Signed)
History of peripheral arterial disease status post multiple interventions in bilateral lower extremities.  His last intervention was 05/11/2018.  Had a patent left SFA stent with high-grade segmental proximal left SFA stenosis.  I performed [facility] 1 directional atherectomy followed by drug-coated balloon angioplasty with excellent result.  His last Dopplers performed 12/12/2018 showed normal ABIs bilaterally with a widely patent left SFA.  He denies claudication.  We will repeat lower extremity arterial Doppler studies.

## 2020-10-28 NOTE — Assessment & Plan Note (Signed)
History of CAD status post RCA PCI and stenting 03/13/2018.  He is totally asymptomatic.

## 2020-10-28 NOTE — Assessment & Plan Note (Addendum)
History of bilateral carotid endarterectomies performed by Dr. Amedeo Plenty in 2007.  I performed right internal carotid artery stenting 01/03/2017 because of progression of disease in his right carotid endarterectomy site.  We will repeat carotid Doppler studies.

## 2020-10-28 NOTE — Assessment & Plan Note (Signed)
History of hyperlipidemia on Zetia and Repatha with lipid profile performed 04/25/2020 revealing a total cholesterol of 128, LDL 67 and HDL 34.

## 2020-11-05 ENCOUNTER — Other Ambulatory Visit: Payer: Self-pay | Admitting: Cardiovascular Disease

## 2020-11-08 ENCOUNTER — Ambulatory Visit (HOSPITAL_COMMUNITY)
Admission: RE | Admit: 2020-11-08 | Discharge: 2020-11-08 | Disposition: A | Discharge status: Home / Self Care | Payer: Medicare Other | Source: Ambulatory Visit | Attending: Internal Medicine | Admitting: Internal Medicine

## 2020-11-08 ENCOUNTER — Other Ambulatory Visit (HOSPITAL_COMMUNITY): Payer: Self-pay | Admitting: Surgery

## 2020-11-08 ENCOUNTER — Encounter (HOSPITAL_COMMUNITY): Payer: Medicare Other

## 2020-11-08 ENCOUNTER — Ambulatory Visit (HOSPITAL_BASED_OUTPATIENT_CLINIC_OR_DEPARTMENT_OTHER)
Admission: RE | Admit: 2020-11-08 | Discharge: 2020-11-08 | Disposition: A | Discharge status: Home / Self Care | Payer: Medicare Other | Source: Ambulatory Visit | Attending: Cardiovascular Disease | Admitting: Cardiovascular Disease

## 2020-11-08 ENCOUNTER — Other Ambulatory Visit (HOSPITAL_COMMUNITY): Payer: Self-pay | Admitting: Cardiovascular Disease

## 2020-11-08 ENCOUNTER — Other Ambulatory Visit: Payer: Self-pay

## 2020-11-08 DIAGNOSIS — I739 Peripheral vascular disease, unspecified: Secondary | ICD-10-CM

## 2020-11-08 DIAGNOSIS — Z95820 Peripheral vascular angioplasty status with implants and grafts: Secondary | ICD-10-CM

## 2020-11-08 DIAGNOSIS — I6523 Occlusion and stenosis of bilateral carotid arteries: Secondary | ICD-10-CM | POA: Insufficient documentation

## 2020-11-08 DIAGNOSIS — Z9862 Peripheral vascular angioplasty status: Secondary | ICD-10-CM

## 2020-11-08 DIAGNOSIS — Z9889 Other specified postprocedural states: Secondary | ICD-10-CM

## 2020-11-08 DIAGNOSIS — Z95828 Presence of other vascular implants and grafts: Secondary | ICD-10-CM

## 2020-11-09 ENCOUNTER — Telehealth: Payer: Self-pay

## 2020-11-09 NOTE — Telephone Encounter (Signed)
-----   Message from Lorretta Harp, MD sent at 11/09/2020  6:48 AM EST ----- No change from prior study. Repeat in 12 months.

## 2020-11-09 NOTE — Telephone Encounter (Signed)
Spoke with pt on the phone, results from Carotid study and Lower extremity duplex reported to pt.  No change since previous study, will repeat these studies in 12 months. Pt verbalizes understanding.

## 2020-11-21 ENCOUNTER — Other Ambulatory Visit (HOSPITAL_COMMUNITY): Payer: Self-pay | Admitting: Cardiovascular Disease

## 2020-11-21 ENCOUNTER — Other Ambulatory Visit: Payer: Self-pay

## 2020-11-21 ENCOUNTER — Ambulatory Visit (HOSPITAL_COMMUNITY)
Admission: RE | Admit: 2020-11-21 | Discharge: 2020-11-21 | Disposition: A | Discharge status: Home / Self Care | Payer: Medicare Other | Source: Ambulatory Visit | Attending: Cardiology | Admitting: Cardiology

## 2020-11-21 ENCOUNTER — Other Ambulatory Visit: Payer: Self-pay | Admitting: Cardiovascular Disease

## 2020-11-21 DIAGNOSIS — Z9889 Other specified postprocedural states: Secondary | ICD-10-CM

## 2020-11-21 DIAGNOSIS — I701 Atherosclerosis of renal artery: Secondary | ICD-10-CM | POA: Insufficient documentation

## 2020-11-21 DIAGNOSIS — I739 Peripheral vascular disease, unspecified: Secondary | ICD-10-CM | POA: Diagnosis not present

## 2020-11-29 ENCOUNTER — Other Ambulatory Visit: Payer: Self-pay

## 2020-11-29 MED ORDER — IRBESARTAN 150 MG PO TABS
150.0000 mg | ORAL_TABLET | Freq: Every day | ORAL | 3 refills | Status: DC
Start: 2020-11-29 — End: 2021-11-03

## 2020-12-06 DIAGNOSIS — R3912 Poor urinary stream: Secondary | ICD-10-CM | POA: Diagnosis not present

## 2020-12-15 ENCOUNTER — Telehealth: Payer: Self-pay | Admitting: Cardiovascular Disease

## 2020-12-15 NOTE — Telephone Encounter (Signed)
*  STAT* If patient is at the pharmacy, call can be transferred to refill team.   1. Which medications need to be refilled? (please list name of each medication and dose if known) atorvastatin 300 mg   2. Which pharmacy/location (including street and city if local pharmacy) is medication to be sent to? [facility]   3. Do they need a 30 day or 90 day supply? Not sure patient also not sure if he should still be taking this.

## 2020-12-26 ENCOUNTER — Telehealth: Payer: Self-pay | Admitting: Cardiovascular Disease

## 2020-12-26 MED ORDER — AMLODIPINE BESYLATE 5 MG PO TABS
5.0000 mg | ORAL_TABLET | Freq: Every day | ORAL | 2 refills | Status: DC
Start: 2020-12-26 — End: 2021-11-03

## 2020-12-26 NOTE — Telephone Encounter (Signed)
     Pt c/o medication issue:  1. Name of Medication:  amLODipine (NORVASC) 10 MG tablet    2. How are you currently taking this medication (dosage and times per day)? Take 1 tablet by mouth once daily  3. Are you having a reaction (difficulty breathing--STAT)?   4. What is your medication issue? Pt said he's been taking half a tablet daily of this medication. He wanted to know since Dr. Gwenlyn Found took him off of his atorvastatin if he needs to go back taking amlodipine a whole tablet a day.

## 2020-12-26 NOTE — Telephone Encounter (Signed)
Returned call to patient-he states he was wondering if he needed to continue taking 1/2 tablet of amlodipine or if he should go back to taking a full tablet ($RemoveBefo'10mg'MRrpGzCQztc$ ).     He states he was made aware last week he is not taking atorvastatin anymore.         Per chart review: amlodipine was decreased to $RemoveBefo'5mg'GqxwdTNgsUK$  from $Rem'10mg'SsIA$  in September (see note 9/29).   Had OV 11/19 with Dr. Wyn Forster changes recommended, BP at visit 114/58.  Patient reports BP usually 120s/80s.    Advised to continue taking amlodipine 5 mg as prescribed (med list never updated).   Patient aware new rx to be sent for a 5 mg tablet so he does not have to continue to cut the tablet in half.    Patient verbalized understanding.

## 2020-12-29 ENCOUNTER — Other Ambulatory Visit: Payer: Self-pay

## 2021-01-17 DIAGNOSIS — M25511 Pain in right shoulder: Secondary | ICD-10-CM | POA: Diagnosis not present

## 2021-01-17 DIAGNOSIS — Z96651 Presence of right artificial knee joint: Secondary | ICD-10-CM | POA: Diagnosis not present

## 2021-03-09 ENCOUNTER — Other Ambulatory Visit: Payer: Self-pay | Admitting: Cardiovascular Disease

## 2021-03-30 ENCOUNTER — Telehealth: Payer: Self-pay | Admitting: Cardiovascular Disease

## 2021-03-30 NOTE — Telephone Encounter (Signed)
New Messsage:      Pt wants to know if he will continue to get his Repatha free?

## 2021-03-30 NOTE — Telephone Encounter (Signed)
Routed to pharmacy team

## 2021-03-30 NOTE — Telephone Encounter (Signed)
Called and spoke w/pt regarding healthwell grant and got it renewed for next yr to make the cost free to the pt. Pt voiced gratitude and understanding

## 2021-04-07 ENCOUNTER — Telehealth: Payer: Self-pay | Admitting: Cardiovascular Disease

## 2021-04-07 NOTE — Telephone Encounter (Signed)
New message    Patient received paperwork to complete to get free repatha.  He says his wife passed away and she completed all paperwork for him.  He want to know if the pharmacist can help him complete the forms or do you have any suggestions on what to do.  Ok to call next week.

## 2021-04-10 NOTE — Telephone Encounter (Signed)
Pt called in and stated that they needed assistance applying for healthwell but from what I can see is that they are already approved so I advised the pt to call the Hotline: [phone] for healthwell foundation to ask them directly if there is anything else they needed for them and the pt voiced understanding

## 2021-04-10 NOTE — Telephone Encounter (Signed)
Returned a call to pt and lmomed him stating that yes we would be happy to assist

## 2021-05-05 ENCOUNTER — Other Ambulatory Visit: Payer: Self-pay | Admitting: Cardiovascular Disease

## 2021-05-11 DIAGNOSIS — L820 Inflamed seborrheic keratosis: Secondary | ICD-10-CM | POA: Diagnosis not present

## 2021-06-07 ENCOUNTER — Other Ambulatory Visit: Payer: Self-pay | Admitting: Cardiovascular Disease

## 2021-06-07 DIAGNOSIS — H02834 Dermatochalasis of left upper eyelid: Secondary | ICD-10-CM | POA: Diagnosis not present

## 2021-06-07 DIAGNOSIS — H02831 Dermatochalasis of right upper eyelid: Secondary | ICD-10-CM | POA: Diagnosis not present

## 2021-06-22 DIAGNOSIS — M25562 Pain in left knee: Secondary | ICD-10-CM | POA: Diagnosis not present

## 2021-06-29 ENCOUNTER — Telehealth: Payer: Self-pay | Admitting: *Deleted

## 2021-06-29 NOTE — Telephone Encounter (Signed)
Handicap plaque paperwork signed by dr berry and placed in the mail to patients home address per his request.

## 2021-07-26 DIAGNOSIS — E039 Hypothyroidism, unspecified: Secondary | ICD-10-CM | POA: Diagnosis not present

## 2021-07-26 DIAGNOSIS — E785 Hyperlipidemia, unspecified: Secondary | ICD-10-CM | POA: Diagnosis not present

## 2021-07-26 DIAGNOSIS — I10 Essential (primary) hypertension: Secondary | ICD-10-CM | POA: Diagnosis not present

## 2021-07-26 DIAGNOSIS — M1712 Unilateral primary osteoarthritis, left knee: Secondary | ICD-10-CM | POA: Diagnosis not present

## 2021-07-26 DIAGNOSIS — Z0000 Encounter for general adult medical examination without abnormal findings: Secondary | ICD-10-CM | POA: Diagnosis not present

## 2021-07-27 ENCOUNTER — Telehealth: Payer: Self-pay

## 2021-07-27 NOTE — Telephone Encounter (Signed)
   [facility] Pre-operative Risk Assessment    Patient Name: TRYTON BODI  DOB: May 27, 1946 MRN: [id number]  HEARTCARE STAFF:  - IMPORTANT!!!!!! Under Visit Info/Reason for Call, type in Other and utilize the format Clearance MM/DD/YY or Clearance TBD. Do not use dashes or single digits. - Please review there is not already an duplicate clearance open for this procedure. - If request is for dental extraction, please clarify the # of teeth to be extracted. - If the patient is currently at the dentist's office, call Pre-Op Callback Staff (MA/nurse) to input urgent request.  - If the patient is not currently in the dentist office, please route to the Pre-Op pool.  Request for surgical clearance:  What type of surgery is being performed? Left total knee arthroplasty  When is this surgery scheduled? 08/29/21  What type of clearance is required (medical clearance vs. Pharmacy clearance to hold med vs. Both)?  Both   Are there any medications that need to be held prior to surgery and how long? ASA   Practice name and name of physician performing surgery? [facility], Dr. Paralee Cancel   What is the office phone number? [phone]   7.   What is the office fax number? [phone] Attn: Orson Slick   8.   Anesthesia type (None, local, MAC, general) ? Spinal    Jacqulynn Cadet 07/27/2021, 3:16 PM  _________________________________________________________________   (provider comments below)

## 2021-07-28 NOTE — Telephone Encounter (Signed)
Pt called back he states that he CAN come to appt he will arrive at 9am

## 2021-07-28 NOTE — Telephone Encounter (Signed)
Primary Cardiologist:Jonathan Gwenlyn Found, MD  Chart reviewed as part of pre-operative protocol coverage. Because of Joshua Wyatt's past medical history and time since last visit, he/she will require a follow-up visit in order to better assess preoperative cardiovascular risk.  Pre-op covering staff: - Please schedule appointment and call patient to inform them. - Please contact requesting surgeon's office via preferred method (i.e, phone, fax) to inform them of need for appointment prior to surgery.  If applicable, this message will also be routed to pharmacy pool and/or primary cardiologist for input on holding anticoagulant/antiplatelet agent as requested below so that this information is available at time of patient's appointment.   Deberah Pelton, NP  07/28/2021, 9:50 AM

## 2021-07-28 NOTE — Telephone Encounter (Signed)
Scheduled appointment 08-08-21 @ 915am.  Called pt and left detailed message about appointment date and time.

## 2021-08-02 DIAGNOSIS — Z9862 Peripheral vascular angioplasty status: Secondary | ICD-10-CM | POA: Diagnosis not present

## 2021-08-02 DIAGNOSIS — Z0000 Encounter for general adult medical examination without abnormal findings: Secondary | ICD-10-CM | POA: Diagnosis not present

## 2021-08-02 DIAGNOSIS — Z87891 Personal history of nicotine dependence: Secondary | ICD-10-CM | POA: Diagnosis not present

## 2021-08-02 DIAGNOSIS — K219 Gastro-esophageal reflux disease without esophagitis: Secondary | ICD-10-CM | POA: Diagnosis not present

## 2021-08-02 DIAGNOSIS — Z9889 Other specified postprocedural states: Secondary | ICD-10-CM | POA: Diagnosis not present

## 2021-08-02 DIAGNOSIS — Z955 Presence of coronary angioplasty implant and graft: Secondary | ICD-10-CM | POA: Diagnosis not present

## 2021-08-02 DIAGNOSIS — E785 Hyperlipidemia, unspecified: Secondary | ICD-10-CM | POA: Diagnosis not present

## 2021-08-02 DIAGNOSIS — M549 Dorsalgia, unspecified: Secondary | ICD-10-CM | POA: Diagnosis not present

## 2021-08-02 DIAGNOSIS — I2510 Atherosclerotic heart disease of native coronary artery without angina pectoris: Secondary | ICD-10-CM | POA: Diagnosis not present

## 2021-08-02 DIAGNOSIS — I739 Peripheral vascular disease, unspecified: Secondary | ICD-10-CM | POA: Diagnosis not present

## 2021-08-08 ENCOUNTER — Ambulatory Visit: Payer: Medicare Other | Admitting: Cardiovascular Disease

## 2021-08-08 ENCOUNTER — Other Ambulatory Visit: Payer: Self-pay

## 2021-08-08 ENCOUNTER — Encounter: Payer: Self-pay | Admitting: Cardiovascular Disease

## 2021-08-08 VITALS — BP 165/66 | HR 77 | Ht 67.0 in | Wt 174.6 lb

## 2021-08-08 DIAGNOSIS — E782 Mixed hyperlipidemia: Secondary | ICD-10-CM | POA: Diagnosis not present

## 2021-08-08 DIAGNOSIS — Z9861 Coronary angioplasty status: Secondary | ICD-10-CM | POA: Diagnosis not present

## 2021-08-08 DIAGNOSIS — I10 Essential (primary) hypertension: Secondary | ICD-10-CM | POA: Diagnosis not present

## 2021-08-08 DIAGNOSIS — I2510 Atherosclerotic heart disease of native coronary artery without angina pectoris: Secondary | ICD-10-CM | POA: Diagnosis not present

## 2021-08-08 DIAGNOSIS — I6523 Occlusion and stenosis of bilateral carotid arteries: Secondary | ICD-10-CM | POA: Diagnosis not present

## 2021-08-08 DIAGNOSIS — I739 Peripheral vascular disease, unspecified: Secondary | ICD-10-CM

## 2021-08-08 NOTE — Patient Instructions (Addendum)
Medication Instructions:  The current medical regimen is effective;  continue present plan and medications.  *If you need a refill on your cardiac medications before your next appointment, please call your pharmacy*   Testing/Procedures: Your physician has requested that you have a carotid duplex. This test is an ultrasound of the carotid arteries in your neck. It looks at blood flow through these arteries that supply the brain with blood. Allow one hour for this exam. There are no restrictions or special instructions.   Your physician has requested that you have a lower or upper extremity arterial duplex. This test is an ultrasound of the arteries in the legs or arms. It looks at arterial blood flow in the legs and arms. Allow one hour for Lower and Upper Arterial scans. There are no restrictions or special instructions   Your physician has requested that you have an ankle brachial index (ABI). During this test an ultrasound and blood pressure cuff are used to evaluate the arteries that supply the arms and legs with blood. Allow thirty minutes for this exam. There are no restrictions or special instructions.  Echocardiogram - Your physician has requested that you have an echocardiogram. Echocardiography is a painless test that uses sound waves to create images of your heart. It provides your doctor with information about the size and shape of your heart and how well your heart's chambers and valves are working. This procedure takes approximately one hour. There are no restrictions for this procedure. This will be performed at our [facility] [place] location - [address], [address].    Follow-Up: At [facility], you and your health needs are our priority.  As part of our continuing mission to provide you with exceptional heart care, we have created designated Provider Care Teams.  These Care Teams include your primary Cardiologist (physician) and Advanced Practice Providers (APPs -  Physician  Assistants and Nurse Practitioners) who all work together to provide you with the care you need, when you need it.  We recommend signing up for the patient portal called "MyChart".  Sign up information is provided on this After Visit Summary.  MyChart is used to connect with patients for Virtual Visits (Telemedicine).  Patients are able to view lab/test results, encounter notes, upcoming appointments, etc.  Non-urgent messages can be sent to your provider as well.   To learn more about what you can do with MyChart, go to [email or web address].    Your next appointment:   12 month(s)  The format for your next appointment:   In Person  Provider:   [name], MD

## 2021-08-08 NOTE — Assessment & Plan Note (Signed)
History of carotid artery disease status post bilateral carotid endarterectomies in 2007 by Dr. Amedeo Plenty.  I performed carotid artery stenting on his right internal carotid artery 01/03/2017 with excellent result.  He had carotid Dopplers performed 11/08/2020 revealing a widely patent carotid stent with no other evidence of ICA stenosis.  This will be repeated on an annual basis.

## 2021-08-08 NOTE — Assessment & Plan Note (Signed)
History of PAD status post multiple interventions on his lower extremities including Viabahn recanalization of a right SFA CTO and right popliteal intervention with two-vessel runoff by myself 10/19/2014.  He had left SFA intervention by myself 02/03/2017 and again 05/11/2018.  He currently denies claudication.  His recent Dopplers performed 11/08/2020 revealed normal ABIs bilaterally with a widely patent left SFA stent.  This will be repeated on an annual basis.

## 2021-08-08 NOTE — Assessment & Plan Note (Signed)
History of essential hypertension blood pressure measured today in the office of 165/66.  Usually at home he says his blood pressure runs in the 120/70 range.  He is on amlodipine and Avapro.

## 2021-08-08 NOTE — Progress Notes (Signed)
08/08/2021 [name]   [birth year]  [id number]  Primary Physician Jani Gravel, MD Primary Cardiologist: Lorretta Harp MD FACP, [place], [place], [place]  HPI:  Joshua Wyatt is a 75 y.o.   thin appearing married Caucasian male with no children who I last saw in the office 10/28/2020.  Unfortunately, his wife of 24 years died at the age of 19 on 11-02-20.  He is currently living alone.  He has a history of PVOD status post bilateral carotid endarterectomies performed by Dr. Drucie Opitz in 2007. We have been following him by duplex ultrasound which performed most recently several days ago revealed this to be widely patent. He has also had bilateral SFA intervention as well as right renal artery stenting. His other problems include hypertension and hyperlipidemia. He did have an episode of chest heaviness recently, but more importantly he has complained of progressive lifestyle limiting claudication since I last saw him. Most recent lower extremity Dopplers reveal a right ABI of 0.93 and a left of 0.82. He does appear to have moderate iliac disease as well as high grade right SFA and popliteal stenosis. He underwent abdominal aortography with bifemoral runoff on 03/30/13 revealed a widely patent right renal artery stent, 50-60% proximal left renal artery stenosis. The left SFA was while he stayed patent and he had a 95% focal above-the-knee popliteal artery stenosis with three-vessel runoff. There was a 60% focal lesion in the right common iliac artery and a 50% segmental proximal to mid right SFA stenosis with a patent stent in the mid to distal right SFA. He also had a 50% right popliteal stenosis with three-vessel runoff. He underwent cutting balloon angioplasty of the left above-the-knee popliteal artery and Angiosculpt balloon with an excellent angiographic result and was discharged the following day. I performed angiography on him 10/11/14 and recanalized a subtotally occluded right SFA with overlapping  via Bahn covered stent performed intervention on his above-the-knee popliteal artery on that side. He had a patent mid to distal left SFA stent with high grade segmental and 6 sequential disease in the proximal SFA and distal SFA on the left. Since I saw him a year and a half ago he's had progressive lifestyle limiting claudication in his left calf. Addition, he's had severe progression of disease in his right internal carotid artery. I performed carotid stenting on him 01/03/17 with an excellent result. He underwent left lower extremity angiography by myself 02/03/17 revealing an occluded distal left SFA stent with otherwise high-grade segmental disease proximal to this and occluded anterior tibial. I performed cutting balloon atherectomy of his in-stent restenosis and the surrounding disease excellent angiographic result. His claudication has completely resolved. His left ABI has improved from 0.59 up to 1.   Because of recurrent claudication I re-angiogram him 05/11/2018 revealing a patent mid left SFA stent with high-grade segmental proximal left SFA stenosis which I performed Hawk 1 directional atherectomy followed by drug-eluting balloon angioplasty.  Claudication resolved and his Dopplers have normalized.  He did have RCA intervention by myself 03/13/2018 with a drug-eluting stent.  He currently denies chest pain, shortness of breath or claudication.  He had elective right left total knee replacement by Dr. Gladstone Lighter in June of this year which was uncomplicated.   Since I saw him a year ago he continues to do well.  He denies chest pain, shortness of breath or claudication.  His Dopplers of his lower extremities and carotids continue to show patency.  Unfortunately, he lost  his wife of 24 years on 10/05/2020.     Current Meds  Medication Sig   amLODipine (NORVASC) 5 MG tablet Take 1 tablet (5 mg total) by mouth daily.   aspirin 81 MG tablet Take 81 mg by mouth daily.   ezetimibe (ZETIA) 10 MG tablet Take  1 tablet by mouth once daily   nitroGLYCERIN (NITROSTAT) 0.4 MG SL tablet Place 1 tablet (0.4 mg total) under the tongue every 5 (five) minutes as needed for chest pain.   pantoprazole (PROTONIX) 40 MG tablet Take 1 tablet by mouth once daily   REPATHA PUSHTRONEX SYSTEM 420 MG/3.5ML SOCT INJECT 420 MG INTO THE SKIN EVERY 30 DAYS   tamsulosin (FLOMAX) 0.4 MG CAPS capsule Take 0.4 mg by mouth every other day. AT HS     Allergies  Allergen Reactions   Statins Swelling and Other (See Comments)    Muscle pain, also    Social History   Socioeconomic History   Marital status: Married    Spouse name: Not on file   Number of children: Not on file   Years of education: Not on file   Highest education level: Not on file  Occupational History   Not on file  Tobacco Use   Smoking status: Former    Packs/day: 1.00    Years: 58.00    Pack years: 58.00    Types: Cigars, Cigarettes    Quit date: 10/18/2013    Years since quitting: 7.8   Smokeless tobacco: Never  Vaping Use   Vaping Use: Never used  Substance and Sexual Activity   Alcohol use: Not Currently   Drug use: Never   Sexual activity: Yes  Other Topics Concern   Not on file  Social History Narrative   Not on file   Social Determinants of Health   Financial Resource Strain: Not on file  Food Insecurity: Not on file  Transportation Needs: Not on file  Physical Activity: Not on file  Stress: Not on file  Social Connections: Not on file  Intimate Partner Violence: Not on file     Review of Systems: General: negative for chills, fever, night sweats or weight changes.  Cardiovascular: negative for chest pain, dyspnea on exertion, edema, orthopnea, palpitations, paroxysmal nocturnal dyspnea or shortness of breath Dermatological: negative for rash Respiratory: negative for cough or wheezing Urologic: negative for hematuria Abdominal: negative for nausea, vomiting, diarrhea, bright red blood per rectum, melena, or  hematemesis Neurologic: negative for visual changes, syncope, or dizziness All other systems reviewed and are otherwise negative except as noted above.    Blood pressure (!) 165/66, pulse 77, height $RemoveBe'5\' 7"'IXQfTswEG$  (1.702 m), weight 174 lb 9.6 oz (79.2 kg), SpO2 98 %.  General appearance: alert and no distress Neck: no adenopathy, no JVD, supple, symmetrical, trachea midline, thyroid not enlarged, symmetric, no tenderness/mass/nodules, and bilateral carotid bruits Lungs: clear to auscultation bilaterally Heart: 2/6 systolic ejection murmur at the base consistent with aortic sclerosis and/or stenosis. Extremities: extremities normal, atraumatic, no cyanosis or edema Pulses: 2+ and symmetric Skin: Skin color, texture, turgor normal. No rashes or lesions Neurologic: Grossly normal  EKG sinus rhythm at 77 with occasional PACs and nonspecific ST and T wave changes.  Personally reviewed this EKG.  ASSESSMENT AND PLAN:   Mixed hyperlipidemia History of hyperlipidemia on Zetia and Repatha with lipid profile performed 07/26/2021 revealing total cholesterol 131, LDL 67 and HDL 43.  HTN (hypertension) History of essential hypertension blood pressure measured today in the office of  165/66.  Usually at home he says his blood pressure runs in the 120/70 range.  He is on amlodipine and Avapro.  Bilateral carotid artery disease ([place]) History of carotid artery disease status post bilateral carotid endarterectomies in 2007 by Dr. Amedeo Plenty.  I performed carotid artery stenting on his right internal carotid artery 01/03/2017 with excellent result.  He had carotid Dopplers performed 11/08/2020 revealing a widely patent carotid stent with no other evidence of ICA stenosis.  This will be repeated on an annual basis.  PAD (peripheral artery disease) (HCC) History of PAD status post multiple interventions on his lower extremities including Viabahn recanalization of a right SFA CTO and right popliteal intervention with  two-vessel runoff by myself 10/19/2014.  He had left SFA intervention by myself 02/03/2017 and again 05/11/2018.  He currently denies claudication.  His recent Dopplers performed 11/08/2020 revealed normal ABIs bilaterally with a widely patent left SFA stent.  This will be repeated on an annual basis.  CAD S/P percutaneous coronary angioplasty History of CAD status post RCA PCI drug-eluting stenting by myself 03/13/2018.  He currently denies chest pain.     Lorretta Harp MD FACP,FACC,FAHA, [facility] 08/08/2021 9:25 AM

## 2021-08-08 NOTE — Assessment & Plan Note (Signed)
History of CAD status post RCA PCI drug-eluting stenting by myself 03/13/2018.  He currently denies chest pain.

## 2021-08-08 NOTE — Assessment & Plan Note (Signed)
History of hyperlipidemia on Zetia and Repatha with lipid profile performed 07/26/2021 revealing total cholesterol 131, LDL 67 and HDL 43.

## 2021-08-16 ENCOUNTER — Telehealth: Payer: Self-pay | Admitting: Cardiovascular Disease

## 2021-08-16 NOTE — Telephone Encounter (Signed)
Patient is wanting to know if Dr.Berry is waiting for him to have a test on 15th , or if he has sent paperwork to Dr. [name] as requested.  For his knee surgery .   Please advise Please contact patient  [phone]

## 2021-08-16 NOTE — Patient Instructions (Addendum)
DUE TO COVID-19 ONLY ONE VISITOR IS ALLOWED TO COME WITH YOU AND STAY IN THE WAITING ROOM ONLY DURING PRE OP AND PROCEDURE DAY OF SURGERY IF YOU ARE GOING HOME AFTER SURGERY. IF YOU ARE SPENDING THE NIGHT 2 PEOPLE MAY VISIT WITH YOU IN YOUR PRIVATE ROOM AFTER SURGERY UNTIL VISITING  HOURS ARE OVER AT 800 PM AND THE 2 VISITORS CANNOT SPEND THE NIGHT.   YOU NEED TO HAVE A COVID 19 TEST ON_9/16__ THIS TEST MUST BE DONE BEFORE SURGERY,  COVID TESTING SITE  IS LOCATED AT [place], . REMAIN IN YOUR CAR THIS IS A DRIVE UP TEST. AFTER YOUR COVID TEST PLEASE WEAR A MASK OUT IN PUBLIC AND SOCIAL DISTANCE AND [place] YOUR HANDS FREQUENTLY, ALSO ASK ALL YOUR CLOSE CONTACT PERSONS TO WEAR A MASK AND SOCIAL DISTANCE AND [place] THEIR HANDS FREQUENTLY ALSO.               Joshua Wyatt     Your procedure is scheduled on: 08/29/21   Report to [facility] Main  Entrance   Report to admitting at   11:50 AM     Call this number if you have problems the morning of surgery [phone]   No food after midnight.    You may have clear liquid until 1:00 pm    At 12:30 PM drink pre surgery drink  . Nothing by mouth after 1:00 PM.    CLEAR LIQUID DIET   Foods Allowed                                                                     Foods Excluded  Coffee and tea, regular and decaf                             liquids that you cannot  Plain Jell-O any favor except red or purple                                           see through such as: Fruit ices (not with fruit pulp)                                     milk, soups, orange juice  Iced Popsicles                                    All solid food Carbonated beverages, regular and diet                                    Cranberry, grape and apple juices Sports drinks like Gatorade Lightly seasoned clear broth or consume(fat free) Sugar    BRUSH YOUR TEETH MORNING OF SURGERY AND RINSE YOUR MOUTH OUT, NO CHEWING GUM CANDY OR  MINTS.     Take these medicines the morning of surgery with A  SIP OF WATER: Amlodipine, Tamsulosin, Pantoprazole  Stop taking __ASA 81_________on _9/14/22_________as instructed by _Dr. Collins____________.                                  You may not have any metal on your body including              piercings  Do not wear jewelry, lotions, powders or  deodorant              Men may shave face and neck.   Do not bring valuables to the hospital. [place].  Contacts, dentures or bridgework may not be worn into surgery.                    Please read over the following fact sheets you were given: _____________________________________________________________________             [facility] - Preparing for Surgery Before surgery, you can play an important role.  Because skin is not sterile, your skin needs to be as free of germs as possible.  You can reduce the number of germs on your skin by washing with CHG (chlorahexidine gluconate) soap before surgery.  CHG is an antiseptic cleaner which kills germs and bonds with the skin to continue killing germs even after washing. Please DO NOT use if you have an allergy to CHG or antibacterial soaps.  If your skin becomes reddened/irritated stop using the CHG and inform your nurse when you arrive at Short Stay.  You may shave your face/neck. Please follow these instructions carefully:  1.  Shower with CHG Soap the night before surgery and the  morning of Surgery.  2.  If you choose to wash your hair, wash your hair first as usual with your  normal  shampoo.  3.  After you shampoo, rinse your hair and body thoroughly to remove the  shampoo.                            4.  Use CHG as you would any other liquid soap.  You can apply chg directly  to the skin and wash                       Gently with a scrungie or clean washcloth.  5.  Apply the CHG Soap to your body ONLY FROM THE NECK DOWN.   Do not  use on face/ open                           Wound or open sores. Avoid contact with eyes, ears mouth and genitals (private parts).                       Wash face,  Genitals (private parts) with your normal soap.             6.  Wash thoroughly, paying special attention to the area where your surgery  will be performed.  7.  Thoroughly rinse your body with warm water from the neck down.  8.  DO NOT shower/wash with your normal soap after using and rinsing off  the CHG Soap.  9.  Pat yourself dry with a clean towel.            10.  Wear clean pajamas.            11.  Place clean sheets on your bed the night of your first shower and do not  sleep with pets. Day of Surgery : Do not apply any lotions/deodorants the morning of surgery.  Please wear clean clothes to the hospital/surgery center.  FAILURE TO FOLLOW THESE INSTRUCTIONS MAY RESULT IN THE CANCELLATION OF YOUR SURGERY PATIENT SIGNATURE_________________________________  NURSE SIGNATURE__________________________________  ________________________________________________________________________   [name]  An incentive spirometer is a tool that can help keep your lungs clear and active. This tool measures how well you are filling your lungs with each breath. Taking long deep breaths may help reverse or decrease the chance of developing breathing (pulmonary) problems (especially infection) following: A long period of time when you are unable to move or be active. BEFORE THE PROCEDURE  If the spirometer includes an indicator to show your best effort, your nurse or respiratory therapist will set it to a desired goal. If possible, sit up straight or lean slightly forward. Try not to slouch. Hold the incentive spirometer in an upright position. INSTRUCTIONS FOR USE  Sit on the edge of your bed if possible, or sit up as far as you can in bed or on a chair. Hold the incentive spirometer in an upright position. Breathe out  normally. Place the mouthpiece in your mouth and seal your lips tightly around it. Breathe in slowly and as deeply as possible, raising the piston or the ball toward the top of the column. Hold your breath for 3-5 seconds or for as long as possible. Allow the piston or ball to fall to the bottom of the column. Remove the mouthpiece from your mouth and breathe out normally. Rest for a few seconds and repeat Steps 1 through 7 at least 10 times every 1-2 hours when you are awake. Take your time and take a few normal breaths between deep breaths. The spirometer may include an indicator to show your best effort. Use the indicator as a goal to work toward during each repetition. After each set of 10 deep breaths, practice coughing to be sure your lungs are clear. If you have an incision (the cut made at the time of surgery), support your incision when coughing by placing a pillow or rolled up towels firmly against it. Once you are able to get out of bed, walk around indoors and cough well. You may stop using the incentive spirometer when instructed by your caregiver.  RISKS AND COMPLICATIONS Take your time so you do not get dizzy or light-headed. If you are in pain, you may need to take or ask for pain medication before doing incentive spirometry. It is harder to take a deep breath if you are having pain. AFTER USE Rest and breathe slowly and easily. It can be helpful to keep track of a log of your progress. Your caregiver can provide you with a simple table to help with this. If you are using the spirometer at home, follow these instructions: [place] IF:  You are having difficultly using the spirometer. You have trouble using the spirometer as often as instructed. Your pain medication is not giving enough relief while using the spirometer. You develop fever of 100.5 F (38.1 C) or higher. SEEK IMMEDIATE MEDICAL CARE IF:  You cough up bloody sputum that had not  been present before. You  develop fever of 102 F (38.9 C) or greater. You develop worsening pain at or near the incision site. MAKE SURE YOU:  Understand these instructions. Will watch your condition. Will get help right away if you are not doing well or get worse. Document Released: 04/08/2007 Document Revised: 02/18/2012 Document Reviewed: 06/09/2007 [facility] Patient Information 2014 [place], [place].   ________________________________________________________________________

## 2021-08-17 ENCOUNTER — Other Ambulatory Visit: Payer: Self-pay

## 2021-08-17 ENCOUNTER — Encounter (HOSPITAL_COMMUNITY)
Admission: RE | Admit: 2021-08-17 | Discharge: 2021-08-17 | Disposition: A | Discharge status: Home / Self Care | Payer: Medicare Other | Source: Ambulatory Visit | Attending: Orthopedic Surgery | Admitting: Orthopedic Surgery

## 2021-08-17 ENCOUNTER — Encounter (HOSPITAL_COMMUNITY): Payer: Self-pay

## 2021-08-17 DIAGNOSIS — Z01818 Encounter for other preprocedural examination: Secondary | ICD-10-CM | POA: Insufficient documentation

## 2021-08-17 HISTORY — DX: Cardiac murmur, unspecified: R01.1

## 2021-08-17 LAB — COMPREHENSIVE METABOLIC PANEL
ALT: 15 U/L (ref 0–44)
AST: 24 U/L (ref 15–41)
Albumin: 4.3 g/dL (ref 3.5–5.0)
Alkaline Phosphatase: 123 U/L (ref 38–126)
Anion gap: 7 (ref 5–15)
BUN: 20 mg/dL (ref 8–23)
CO2: 24 mmol/L (ref 22–32)
Calcium: 10.8 mg/dL — ABNORMAL HIGH (ref 8.9–10.3)
Chloride: 107 mmol/L (ref 98–111)
Creatinine, Ser: 1.61 mg/dL — ABNORMAL HIGH (ref 0.61–1.24)
GFR, Estimated: 44 mL/min — ABNORMAL LOW (ref >60)
Glucose, Bld: 91 mg/dL (ref 70–99)
Potassium: 4.8 mmol/L (ref 3.5–5.1)
Sodium: 138 mmol/L (ref 135–145)
Total Bilirubin: 0.6 mg/dL (ref 0.3–1.2)
Total Protein: 7.6 g/dL (ref 6.5–8.1)

## 2021-08-17 LAB — CBC
HCT: 41.6 % (ref 39.0–52.0)
Hemoglobin: 13.5 g/dL (ref 13.0–17.0)
MCH: 29.9 pg (ref 26.0–34.0)
MCHC: 32.5 g/dL (ref 30.0–36.0)
MCV: 92 fL (ref 80.0–100.0)
Platelets: 234 10*3/uL (ref 150–400)
RBC: 4.52 MIL/uL (ref 4.22–5.81)
RDW: 12.2 % (ref 11.5–15.5)
WBC: 8.4 10*3/uL (ref 4.0–10.5)
nRBC: 0 % (ref 0.0–0.2)

## 2021-08-17 LAB — SURGICAL PCR SCREEN
MRSA, PCR: POSITIVE — AB
Staphylococcus aureus: POSITIVE — AB

## 2021-08-17 LAB — TYPE AND SCREEN
ABO/RH(D): A POS
Antibody Screen: NEGATIVE

## 2021-08-17 NOTE — Progress Notes (Addendum)
COVID test Completed:08/25/21   PCP - Dr. Mady Gemma LOV 08/02/21 Cardiologist -  Dr. Adora Fridge LOV 08/24/21  Chest x-ray - no EKG - 08/08/21-epic Stress Test - no ECHO - last one 05/07/19 will have one on 08/24/21-epic Cardiac Cath - 2015, 2019 x2-epic Pacemaker/ICD device last checked:NA  Sleep Study - no CPAP -   Fasting Blood Sugar - no Checks Blood Sugar _____ times a day  Blood Thinner Instructions:ASA 81/ Dr. Theda Sers Aspirin Instructions:Stop 5 days prior to DOS/[place] Last Dose:08/23/21  Anesthesia review: yes  Patient denies shortness of breath, fever, cough and chest pain at PAT appointment Yes. Pt repoets no SOB with ADLs or doing work around the house. He is using a cane.  Patient verbalized understanding of instructions that were given to them at the PAT appointment. Patient was also instructed that they will need to review over the PAT instructions again at home before surgery. yes

## 2021-08-24 ENCOUNTER — Ambulatory Visit (HOSPITAL_COMMUNITY): Payer: Medicare Other | Attending: Cardiovascular Disease

## 2021-08-25 ENCOUNTER — Ambulatory Visit (HOSPITAL_COMMUNITY): Payer: Medicare Other | Attending: Cardiovascular Disease

## 2021-08-25 ENCOUNTER — Other Ambulatory Visit: Payer: Self-pay

## 2021-08-25 DIAGNOSIS — I2510 Atherosclerotic heart disease of native coronary artery without angina pectoris: Secondary | ICD-10-CM | POA: Diagnosis not present

## 2021-08-25 DIAGNOSIS — I6523 Occlusion and stenosis of bilateral carotid arteries: Secondary | ICD-10-CM | POA: Insufficient documentation

## 2021-08-25 DIAGNOSIS — R011 Cardiac murmur, unspecified: Secondary | ICD-10-CM | POA: Insufficient documentation

## 2021-08-25 DIAGNOSIS — I10 Essential (primary) hypertension: Secondary | ICD-10-CM | POA: Diagnosis not present

## 2021-08-25 DIAGNOSIS — I739 Peripheral vascular disease, unspecified: Secondary | ICD-10-CM | POA: Diagnosis not present

## 2021-08-25 DIAGNOSIS — Z01810 Encounter for preprocedural cardiovascular examination: Secondary | ICD-10-CM | POA: Diagnosis not present

## 2021-08-25 DIAGNOSIS — E785 Hyperlipidemia, unspecified: Secondary | ICD-10-CM | POA: Insufficient documentation

## 2021-08-25 DIAGNOSIS — I350 Nonrheumatic aortic (valve) stenosis: Secondary | ICD-10-CM

## 2021-08-25 LAB — ECHOCARDIOGRAM COMPLETE
AR max vel: 1.33 cm2
AV Area VTI: 1.29 cm2
AV Area mean vel: 1.21 cm2
AV Mean grad: 14 mmHg
AV Peak grad: 24.2 mmHg
Ao pk vel: 2.46 m/s
Area-P 1/2: 3.91 cm2
S' Lateral: 2.8 cm

## 2021-08-25 NOTE — Progress Notes (Signed)
.  ho

## 2021-08-25 NOTE — Progress Notes (Addendum)
Anesthesia Chart Review   Case: [id number] Date/Time: 08/29/21 1405   Procedure: TOTAL KNEE ARTHROPLASTY (Left: Knee)   Anesthesia type: Spinal   Pre-op diagnosis: Left knee osteoarthritis   Location: [facility]   Surgeons: Paralee Cancel, MD       DISCUSSION:75 y.o. former smoker with h/o HTN, GERD, CAD (RCA PCI with DES 03/13/2018), PAD with multiple interventions, bilateral carotid endarterectomies 2007, mild aortic stenosis, left knee OA scheduled for above procedure 08/29/2021 with Dr. Paralee Cancel.   Pt last seen by cardiology 08/08/2021. Per OV note no anginal sx at this time. Dopplers of his lower extremities and carotids continue to show patency.   Echo 08/25/2021 with EF 50-55%, mild aortic valve stenosis.   Anticipate pt can proceed with planned procedure barring acute status change.   VS: BP (!) 175/91   Pulse 77   Temp 36.9 C (Oral)   Resp 20   Ht 5' 7.5" (1.715 m)   Wt 78.9 kg   SpO2 99%   BMI 26.85 kg/m   PROVIDERS: Jani Gravel, MD is PCP   [name], MD is Cardiologist  LABS: Labs reviewed: Acceptable for surgery. (all labs ordered are listed, but only abnormal results are displayed)  Labs Reviewed  SURGICAL PCR SCREEN - Abnormal; Notable for the following components:      Result Value   MRSA, PCR POSITIVE (*)    Staphylococcus aureus POSITIVE (*)    All other components within normal limits  COMPREHENSIVE METABOLIC PANEL - Abnormal; Notable for the following components:   Creatinine, Ser 1.61 (*)    Calcium 10.8 (*)    GFR, Estimated 44 (*)    All other components within normal limits  CBC  TYPE AND SCREEN     IMAGES:   EKG: 08/17/2021 Rate 79 bpm  Sinus rhythm with Premature atrial complexes Otherwise normal ECG  CV: Echo 08/25/2021  1. Left ventricular ejection fraction, by estimation, is 50 to 55%. The  left ventricle has low normal function. The left ventricle has no regional  wall motion abnormalities. Left ventricular  diastolic parameters are  indeterminate.   2. Right ventricular systolic function is normal. The right ventricular  size is normal. Tricuspid regurgitation signal is inadequate for assessing  PA pressure.   3. Left atrial size was moderately dilated.   4. The mitral valve is abnormal. Mild mitral valve regurgitation. No  evidence of mitral stenosis.   5. The aortic valve has an indeterminant number of cusps. There is  moderate calcification of the aortic valve. There is moderate thickening  of the aortic valve. Aortic valve regurgitation is not visualized. Mild  aortic valve stenosis.   Stress Test 12/14/2016 The left ventricular ejection fraction is mildly decreased (45-54%). Nuclear stress EF: 49%. The study is normal. This is a low risk study. There was no ST segment deviation noted during stress.   Normal resting and stress perfusion. No ischemia or infarction EF 49% but visually looks normal Marked motion artifact on RAW images  Past Medical History:  Diagnosis Date   Arthritis    "LITTLE IN MY LEFT HAND" (05/12/2018)   Bilateral calf pain 03/02/2013   lower ext.doppler-mild arterial insufficiency to lower ext. this is a disease in val   CAD (coronary artery disease)    a. cath 02/24/18 - 90% ost RCA; 40% ost & pro LAD   Cerebral atherosclerosis 03/02/2013   carotid duplex- normal study   Deviated septum    RIGHT  GERD (gastroesophageal reflux disease)    Heart murmur    History of echocardiogram 08/02/2005   normal study    History of kidney stones    Hyperlipemia    Hypertension 03/30/2013   PV Angiogram- rt. renal artery stent was widely open,50-60% lt. renal artery stenosis,lt. SFA stents patent with high grade above the knee  poplital stenosis   Hyponatremia 02/03/2017   Normal cardiac stress test 03/25/2013   EF 60%, normal stress test ,this is a presurgical  test   Peripheral vascular disease ([place])    PVD (peripheral vascular disease) ([place])    A. 2009: S/P  PTA/stent to D SFA. B. 01/2017: angiosculpt atherectomy/drug-eluting balloon angioplasty to L SFA.   Renal artery stenosis ([place]) 03/02/2013   renal artery doppler-this was an abnormal doppler    Past Surgical History:  Procedure Laterality Date   APPENDECTOMY  1960's   CAROTID ENDARTERECTOMY Bilateral 2000's   CORONARY ANGIOPLASTY WITH STENT PLACEMENT  03/13/2018   CORONARY STENT INTERVENTION N/A 03/13/2018   Procedure: CORONARY STENT INTERVENTION;  Surgeon: Lorretta Harp, MD;  Location: [place] [facility];  Service: Cardiovascular;  Laterality: N/A;   CYSTOSCOPY W/ STONE MANIPULATION  "X 4 or 5"   LEFT HEART CATH AND CORONARY ANGIOGRAPHY N/A 02/24/2018   Procedure: LEFT HEART CATH AND CORONARY ANGIOGRAPHY;  Surgeon: Lorretta Harp, MD;  Location: [place] [facility];  Service: Cardiovascular;  Laterality: N/A;   LITHOTRIPSY  "several"   LOWER EXTREMITY ANGIOGRAM N/A 10/11/2014   Procedure: LOWER EXTREMITY ANGIOGRAM;  Surgeon: Lorretta Harp, MD;  Location: [facility] CATH LAB;  Service: Cardiovascular;  Laterality: N/A;   LOWER EXTREMITY ANGIOGRAPHY N/A 02/04/2017   Procedure: Lower Extremity Angiography;  Surgeon: Lorretta Harp, MD;  Location: [place] [facility];  Service: Cardiovascular;  Laterality: N/A;   LOWER EXTREMITY ANGIOGRAPHY N/A 05/12/2018   Procedure: LOWER EXTREMITY ANGIOGRAPHY;  Surgeon: Lorretta Harp, MD;  Location: [place] [facility];  Service: Cardiovascular;  Laterality: N/A;   PERCUTANEOUS STENT INTERVENTION  10/11/2014   Procedure: PERCUTANEOUS STENT INTERVENTION;  Surgeon: Lorretta Harp, MD;  Location: [facility] CATH LAB;  Service: Cardiovascular;;  right [email or web address]   PERIPHERAL VASCULAR ATHERECTOMY  05/12/2018   Procedure: PERIPHERAL VASCULAR ATHERECTOMY;  Surgeon: Lorretta Harp, MD;  Location: [place] [facility] LAB;  Service: Cardiovascular;;  left SFA   PERIPHERAL VASCULAR CATHETERIZATION N/A 01/03/2017   Procedure: Carotid PTA/Stent Intervention / Right;  Surgeon: Lorretta Harp, MD;  Location: [place] [facility];  Service: Cardiovascular;  Laterality: N/A;   PERIPHERAL VASCULAR INTERVENTION Left 02/04/2017   Procedure: Peripheral Vascular Intervention;  Surgeon: Lorretta Harp, MD;  Location: [place] [facility];  Service: Cardiovascular;  Laterality: Left;  left SFA   POPLITEAL ARTERY ANGIOPLASTY Left 03/30/2013   TONSILLECTOMY  1960's   TOTAL KNEE ARTHROPLASTY Right 05/06/2019   Procedure: TOTAL KNEE ARTHROPLASTY;  Surgeon: Latanya Maudlin, MD;  Location: [facility];  Service: Orthopedics;  Laterality: Right;  129min    MEDICATIONS:  amLODipine (NORVASC) 5 MG tablet   aspirin 81 MG tablet   ezetimibe (ZETIA) 10 MG tablet   irbesartan (AVAPRO) 150 MG tablet   nitroGLYCERIN (NITROSTAT) 0.4 MG SL tablet   pantoprazole (PROTONIX) 40 MG tablet   REPATHA PUSHTRONEX SYSTEM 420 MG/3.5ML SOCT   tamsulosin (FLOMAX) 0.4 MG CAPS capsule   No current facility-administered medications for this encounter.     Konrad Felix Ward, PA-C [facility]-Surgical Testing [phone]

## 2021-08-28 NOTE — Anesthesia Preprocedure Evaluation (Deleted)
Anesthesia Evaluation    Airway        Dental   Pulmonary former smoker,           Cardiovascular hypertension,      Neuro/Psych    GI/Hepatic   Endo/Other    Renal/GU      Musculoskeletal   Abdominal   Peds  Hematology   Anesthesia Other Findings   Reproductive/Obstetrics                                                              Anesthesia Evaluation  Patient identified by MRN, date of birth, ID band Patient awake    Reviewed: Allergy & Precautions, NPO status , Patient's Chart, lab work & pertinent test results  Airway Mallampati: II  TM Distance: >3 FB Neck ROM: Full    Dental no notable dental hx.    Pulmonary former smoker,    Pulmonary exam normal breath sounds clear to auscultation       Cardiovascular hypertension, + angina + CAD, + Cardiac Stents and + Peripheral Vascular Disease  Normal cardiovascular exam Rhythm:Regular Rate:Normal  Pre-op eval per cardiology Gwenlyn Found)   Neuro/Psych negative neurological ROS  negative psych ROS   GI/Hepatic Neg liver ROS, GERD  Medicated and Controlled,  Endo/Other  negative endocrine ROS  Renal/GU Renal InsufficiencyRenal disease     Musculoskeletal negative musculoskeletal ROS (+)   Abdominal   Peds  Hematology  (+) anemia , HLD   Anesthesia Other Findings right knee osteoarthritis  Reproductive/Obstetrics                           Anesthesia Physical Anesthesia Plan  ASA: III  Anesthesia Plan: Spinal and Regional   Post-op Pain Management:  Regional for Post-op pain   Induction:   PONV Risk Score and Plan: 1 and Ondansetron, Dexamethasone, Treatment may vary due to age or medical condition and Propofol infusion  Airway Management Planned: Simple Face Mask  Additional Equipment:   Intra-op Plan:   Post-operative Plan:   Informed Consent: I have reviewed the  patients History and Physical, chart, labs and discussed the procedure including the risks, benefits and alternatives for the proposed anesthesia with the patient or authorized representative who has indicated his/her understanding and acceptance.     Dental advisory given  Plan Discussed with: CRNA and Surgeon  Anesthesia Plan Comments: (Reviewed PAT note 04/24/19, Konrad Felix, PA-C)     Anesthesia Quick Evaluation  Anesthesia Physical Anesthesia Plan  ASA:   Anesthesia Plan:    Post-op Pain Management:    Induction:   PONV Risk Score and Plan:   Airway Management Planned:   Additional Equipment:   Intra-op Plan:   Post-operative Plan:   Informed Consent:   Plan Discussed with:   Anesthesia Plan Comments: (See PAT note 08/17/2021, Konrad Felix Ward, PA-C)        Anesthesia Quick Evaluation

## 2021-08-28 NOTE — H&P (Signed)
TOTAL KNEE ADMISSION H&P  Patient is being admitted for left total knee arthroplasty.  Subjective:  Chief Complaint: Left knee pain.  HPI: Joshua Wyatt, 75 y.o. male has a history of pain and functional disability in the left knee due to arthritis and has failed non-surgical conservative treatments for greater than 12 weeks to include corticosteriod injections and activity modification. Onset of symptoms was gradual, starting  several  years ago with gradually worsening course since that time. The patient noted no past surgery on the left knee.  Patient currently rates pain in the left knee at 6 out of 10 with activity. Patient has night pain, worsening of pain with activity and weight bearing, pain with passive range of motion, and crepitus. Patient has evidence of periarticular osteophytes and joint space narrowing by imaging studies. There is no active infection.  Patient Active Problem List   Diagnosis Date Noted   Chest pain 05/07/2019   GERD (gastroesophageal reflux disease) 05/07/2019   H/O total knee replacement, right 05/06/2019   Preoperative clearance 12/17/2018   Status post coronary artery stent placement    CAD S/P percutaneous coronary angioplasty 03/13/2018   2nd degree AV block 02/25/2018   Status post left heart catheterization 02/24/2018   Unstable angina (HCC) 02/21/2018   PAD (peripheral artery disease) ([place]) 02/03/2017   Hyponatremia 02/03/2017   CKD (chronic kidney disease) stage 3, GFR 30-59 ml/min (HCC) 02/03/2017   Hypokalemia 01/04/2017   Bilateral carotid artery disease ([place]) 06/17/2015   HTN (hypertension) 04/27/2013   PVD (peripheral vascular disease) ([place]) 03/31/2013   Severe claudication ([place]) 03/31/2013   Mixed hyperlipidemia 03/31/2013    Past Medical History:  Diagnosis Date   Arthritis    "LITTLE IN MY LEFT HAND" (05/12/2018)   Bilateral calf pain 03/02/2013   lower ext.doppler-mild arterial insufficiency to lower ext. this is a disease in val    CAD (coronary artery disease)    a. cath 02/24/18 - 90% ost RCA; 40% ost & pro LAD   Cerebral atherosclerosis 03/02/2013   carotid duplex- normal study   Deviated septum    RIGHT   GERD (gastroesophageal reflux disease)    Heart murmur    History of echocardiogram 08/02/2005   normal study    History of kidney stones    Hyperlipemia    Hypertension 03/30/2013   PV Angiogram- rt. renal artery stent was widely open,50-60% lt. renal artery stenosis,lt. SFA stents patent with high grade above the knee  poplital stenosis   Hyponatremia 02/03/2017   Normal cardiac stress test 03/25/2013   EF 60%, normal stress test ,this is a presurgical  test   Peripheral vascular disease ([place])    PVD (peripheral vascular disease) ([place])    A. 2009: S/P PTA/stent to D SFA. B. 01/2017: angiosculpt atherectomy/drug-eluting balloon angioplasty to L SFA.   Renal artery stenosis ([place]) 03/02/2013   renal artery doppler-this was an abnormal doppler    Past Surgical History:  Procedure Laterality Date   APPENDECTOMY  1960's   CAROTID ENDARTERECTOMY Bilateral 2000's   CORONARY ANGIOPLASTY WITH STENT PLACEMENT  03/13/2018   CORONARY STENT INTERVENTION N/A 03/13/2018   Procedure: CORONARY STENT INTERVENTION;  Surgeon: Lorretta Harp, MD;  Location: [place] [facility];  Service: Cardiovascular;  Laterality: N/A;   CYSTOSCOPY W/ STONE MANIPULATION  "X 4 or 5"   LEFT HEART CATH AND CORONARY ANGIOGRAPHY N/A 02/24/2018   Procedure: LEFT HEART CATH AND CORONARY ANGIOGRAPHY;  Surgeon: Lorretta Harp, MD;  Location: [facility]  INVASIVE CV LAB;  Service: Cardiovascular;  Laterality: N/A;   LITHOTRIPSY  "several"   LOWER EXTREMITY ANGIOGRAM N/A 10/11/2014   Procedure: LOWER EXTREMITY ANGIOGRAM;  Surgeon: Lorretta Harp, MD;  Location: [facility] CATH LAB;  Service: Cardiovascular;  Laterality: N/A;   LOWER EXTREMITY ANGIOGRAPHY N/A 02/04/2017   Procedure: Lower Extremity Angiography;  Surgeon: Lorretta Harp, MD;  Location: [place] [facility] LAB;  Service: Cardiovascular;  Laterality: N/A;   LOWER EXTREMITY ANGIOGRAPHY N/A 05/12/2018   Procedure: LOWER EXTREMITY ANGIOGRAPHY;  Surgeon: Lorretta Harp, MD;  Location: [place] [facility] LAB;  Service: Cardiovascular;  Laterality: N/A;   PERCUTANEOUS STENT INTERVENTION  10/11/2014   Procedure: PERCUTANEOUS STENT INTERVENTION;  Surgeon: Lorretta Harp, MD;  Location: [facility] CATH LAB;  Service: Cardiovascular;;  right sfax2   PERIPHERAL VASCULAR ATHERECTOMY  05/12/2018   Procedure: PERIPHERAL VASCULAR ATHERECTOMY;  Surgeon: Lorretta Harp, MD;  Location: [place] [facility] LAB;  Service: Cardiovascular;;  left SFA   PERIPHERAL VASCULAR CATHETERIZATION N/A 01/03/2017   Procedure: Carotid PTA/Stent Intervention / Right;  Surgeon: Lorretta Harp, MD;  Location: [place] [facility] LAB;  Service: Cardiovascular;  Laterality: N/A;   PERIPHERAL VASCULAR INTERVENTION Left 02/04/2017   Procedure: Peripheral Vascular Intervention;  Surgeon: Lorretta Harp, MD;  Location: [place] [facility] LAB;  Service: Cardiovascular;  Laterality: Left;  left SFA   POPLITEAL ARTERY ANGIOPLASTY Left 03/30/2013   TONSILLECTOMY  1960's   TOTAL KNEE ARTHROPLASTY Right 05/06/2019   Procedure: TOTAL KNEE ARTHROPLASTY;  Surgeon: Latanya Maudlin, MD;  Location: [facility];  Service: Orthopedics;  Laterality: Right;  193min    Prior to Admission medications   Medication Sig Start Date End Date Taking? Authorizing Provider  amLODipine (NORVASC) 5 MG tablet Take 1 tablet (5 mg total) by mouth daily. 12/26/20   Lorretta Harp, MD  aspirin 81 MG tablet Take 81 mg by mouth daily.    [provider]  ezetimibe (ZETIA) 10 MG tablet Take 1 tablet by mouth once daily 11/07/20   Lorretta Harp, MD  irbesartan (AVAPRO) 150 MG tablet Take 1 tablet (150 mg total) by mouth daily. Patient not taking: Reported on 08/08/2021 11/29/20   Lorretta Harp, MD  nitroGLYCERIN (NITROSTAT) 0.4 MG SL tablet Place 1 tablet (0.4 mg total)  under the tongue every 5 (five) minutes as needed for chest pain. 09/05/20   Lorretta Harp, MD  pantoprazole (PROTONIX) 40 MG tablet Take 1 tablet by mouth once daily 06/07/21   Lorretta Harp, MD  [place] 420 MG/3.5ML SOCT INJECT 420 MG INTO THE SKIN EVERY 30 DAYS 05/05/21   Lorretta Harp, MD  tamsulosin (FLOMAX) 0.4 MG CAPS capsule Take 0.4 mg by mouth every other day. AT HS    [provider]    Allergies  Allergen Reactions   Statins Swelling and Other (See Comments)    Muscle pain, also    Social History   Socioeconomic History   Marital status: Married    Spouse name: Not on file   Number of children: Not on file   Years of education: Not on file   Highest education level: Not on file  Occupational History   Not on file  Tobacco Use   Smoking status: Former    Packs/day: 1.00    Years: 58.00    Pack years: 58.00    Types: Cigars, Cigarettes    Quit date: 10/18/2013    Years since quitting: 7.8  Smokeless tobacco: Never  Vaping Use   Vaping Use: Never used  Substance and Sexual Activity   Alcohol use: Not Currently   Drug use: Never   Sexual activity: Yes  Other Topics Concern   Not on file  Social History Narrative   Not on file   Social Determinants of Health   Financial Resource Strain: Not on file  Food Insecurity: Not on file  Transportation Needs: Not on file  Physical Activity: Not on file  Stress: Not on file  Social Connections: Not on file  Intimate Partner Violence: Not on file    Tobacco Use: Medium Risk   Smoking Tobacco Use: Former   Smokeless Tobacco Use: Never   Social History   Substance and Sexual Activity  Alcohol Use Not Currently    Family History  Problem Relation Age of Onset   Heart disease Father        CABG   Healthy Sister    Healthy Brother     Review of Systems  Constitutional:  Negative for chills and fever.  HENT:  Negative for congestion, sore throat and tinnitus.   Eyes:   Negative for double vision, photophobia and pain.  Respiratory:  Negative for cough, shortness of breath and wheezing.   Cardiovascular:  Negative for chest pain, palpitations and orthopnea.  Gastrointestinal:  Negative for heartburn, nausea and vomiting.  Genitourinary:  Negative for dysuria, frequency and urgency.  Musculoskeletal:  Positive for joint pain.  Neurological:  Negative for dizziness, weakness and headaches.   Objective:  Physical Exam: Well nourished and well developed. General: Alert and oriented x3, cooperative and pleasant, no acute distress. Head: normocephalic, atraumatic, neck supple. Eyes: EOMI.  Musculoskeletal:  Left knee exam:  No palpable effusion, warmth erythema  Slight varus associated with a slight flexion contracture  Most tenderness over the medial and anterior aspect the knee  He flexes close to 120 degrees with tightness and mild crepitation  Stable medial lateral collateral ligaments   Calves soft and nontender. Motor function intact in LE. Strength 5/5 LE bilaterally. Neuro: Distal pulses 2+. Sensation to light touch intact in LE.  Imaging Review Plain radiographs demonstrate severe degenerative joint disease of the left knee. The overall alignment is neutral. The bone quality appears to be adequate for age and reported activity level.  Assessment/Plan:  End stage arthritis, left knee   The patient history, physical examination, clinical judgment of the provider and imaging studies are consistent with end stage degenerative joint disease of the left knee and total knee arthroplasty is deemed medically necessary. The treatment options including medical management, injection therapy arthroscopy and arthroplasty were discussed at length. The risks and benefits of total knee arthroplasty were presented and reviewed. The risks due to aseptic loosening, infection, stiffness, patella tracking problems, thromboembolic complications and other  imponderables were discussed. The patient acknowledged the explanation, agreed to proceed with the plan and consent was signed. Patient is being admitted for inpatient treatment for surgery, pain control, PT, OT, prophylactic antibiotics, VTE prophylaxis, progressive ambulation and ADLs and discharge planning. The patient is planning to be discharged  home .   Patient's anticipated LOS is less than 2 midnights, meeting these requirements: - Lives within 1 hour of care - Has a competent adult at home to recover with post-op recover - NO history of  - Chronic pain requiring opioids  - Diabetes  - Heart failure  - Heart attack  - Stroke  - DVT/VTE  - Cardiac arrhythmia  -  Respiratory Failure/COPD  - Anemia  - Advanced Liver disease  Therapy Plans: outpatient therapy at [facility] Disposition: Home with friend to her house Planned DVT Prophylaxis: Aspirin 81 mg BID (hx of multiple stents) DME needed: 3-n-1 PCP: Dr. Theda Sers Cardiologist: Dr. Gwenlyn Found TXA: IV Allergies: NKDA Anesthesia Concerns: none BMI: 24.8 Last HgbA1c: Not diabetic.   Other: - stent in left leg, right renal artery, & cardiac stents - CKD stg 3 - no NSAIDs - Had to stay extra after last TKA due to confusion after Oxycodone - will use Tramadol/Norco, but be gentle with this   - Patient was instructed on what medications to stop prior to surgery. - Follow-up visit in 2 weeks with Dr. Wynelle Link - Begin physical therapy following surgery - Pre-operative lab work as pre-surgical testing - Prescriptions will be provided in hospital at time of discharge  Theresa Duty, PA-C Orthopedic Surgery [facility] [place]

## 2021-08-29 ENCOUNTER — Encounter (HOSPITAL_COMMUNITY)
Admission: RE | Disposition: A | Discharge status: Home / Self Care | Payer: Self-pay | Source: Home / Self Care | Attending: Orthopedic Surgery

## 2021-08-29 ENCOUNTER — Ambulatory Visit (HOSPITAL_COMMUNITY): Payer: Medicare Other | Admitting: Emergency Medicine

## 2021-08-29 ENCOUNTER — Observation Stay (HOSPITAL_COMMUNITY)
Admission: RE | Admit: 2021-08-29 | Discharge: 2021-08-30 | Disposition: A | Discharge status: Home / Self Care | Payer: Medicare Other | Attending: Orthopedic Surgery | Admitting: Orthopedic Surgery

## 2021-08-29 ENCOUNTER — Ambulatory Visit (HOSPITAL_COMMUNITY): Payer: Medicare Other | Admitting: Anesthesiology

## 2021-08-29 ENCOUNTER — Other Ambulatory Visit: Payer: Self-pay

## 2021-08-29 ENCOUNTER — Encounter (HOSPITAL_COMMUNITY): Payer: Self-pay | Admitting: Orthopedic Surgery

## 2021-08-29 DIAGNOSIS — E876 Hypokalemia: Secondary | ICD-10-CM | POA: Diagnosis not present

## 2021-08-29 DIAGNOSIS — Z96651 Presence of right artificial knee joint: Secondary | ICD-10-CM | POA: Insufficient documentation

## 2021-08-29 DIAGNOSIS — E782 Mixed hyperlipidemia: Secondary | ICD-10-CM | POA: Diagnosis not present

## 2021-08-29 DIAGNOSIS — Z79899 Other long term (current) drug therapy: Secondary | ICD-10-CM | POA: Insufficient documentation

## 2021-08-29 DIAGNOSIS — N183 Chronic kidney disease, stage 3 unspecified: Secondary | ICD-10-CM | POA: Diagnosis not present

## 2021-08-29 DIAGNOSIS — I2510 Atherosclerotic heart disease of native coronary artery without angina pectoris: Secondary | ICD-10-CM | POA: Insufficient documentation

## 2021-08-29 DIAGNOSIS — M1712 Unilateral primary osteoarthritis, left knee: Secondary | ICD-10-CM | POA: Diagnosis not present

## 2021-08-29 DIAGNOSIS — Z7982 Long term (current) use of aspirin: Secondary | ICD-10-CM | POA: Diagnosis not present

## 2021-08-29 DIAGNOSIS — I129 Hypertensive chronic kidney disease with stage 1 through stage 4 chronic kidney disease, or unspecified chronic kidney disease: Secondary | ICD-10-CM | POA: Diagnosis not present

## 2021-08-29 DIAGNOSIS — Z96652 Presence of left artificial knee joint: Secondary | ICD-10-CM

## 2021-08-29 DIAGNOSIS — Z87891 Personal history of nicotine dependence: Secondary | ICD-10-CM | POA: Diagnosis not present

## 2021-08-29 DIAGNOSIS — G8918 Other acute postprocedural pain: Secondary | ICD-10-CM | POA: Diagnosis not present

## 2021-08-29 HISTORY — PX: TOTAL KNEE ARTHROPLASTY: SHX125

## 2021-08-29 SURGERY — ARTHROPLASTY, KNEE, TOTAL
Anesthesia: Monitor Anesthesia Care | Site: Knee | Laterality: Left

## 2021-08-29 MED ORDER — MIDAZOLAM HCL 2 MG/2ML IJ SOLN
1.0000 mg | Freq: Once | INTRAMUSCULAR | Status: AC
  Administered 2021-08-29: 1 mg via INTRAVENOUS
  Filled 2021-08-29: qty 2

## 2021-08-29 MED ORDER — LACTATED RINGERS IV SOLN: INTRAVENOUS | Status: DC

## 2021-08-29 MED ORDER — NITROGLYCERIN 0.4 MG SL SUBL: 0.4000 mg | SUBLINGUAL_TABLET | SUBLINGUAL | Status: DC | PRN

## 2021-08-29 MED ORDER — HYDROCODONE-ACETAMINOPHEN 5-325 MG PO TABS
1.0000 | ORAL_TABLET | ORAL | Status: DC | PRN
  Administered 2021-08-29 – 2021-08-30 (×5): 1 via ORAL
  Filled 2021-08-29: qty 1
  Filled 2021-08-29: qty 2
  Filled 2021-08-29 (×3): qty 1

## 2021-08-29 MED ORDER — ASPIRIN 81 MG PO CHEW
81.0000 mg | CHEWABLE_TABLET | Freq: Two times a day (BID) | ORAL | Status: DC
  Administered 2021-08-29 – 2021-08-30 (×2): 81 mg via ORAL
  Filled 2021-08-29 (×2): qty 1

## 2021-08-29 MED ORDER — BUPIVACAINE-EPINEPHRINE (PF) 0.25% -1:200000 IJ SOLN
INTRAMUSCULAR | Status: DC | PRN
  Administered 2021-08-29: 30 mL

## 2021-08-29 MED ORDER — FERROUS SULFATE 325 (65 FE) MG PO TABS
325.0000 mg | ORAL_TABLET | Freq: Three times a day (TID) | ORAL | Status: DC
  Administered 2021-08-30: 325 mg via ORAL
  Filled 2021-08-29: qty 1

## 2021-08-29 MED ORDER — KETOROLAC TROMETHAMINE 30 MG/ML IJ SOLN
INTRAMUSCULAR | Status: AC
  Filled 2021-08-29: qty 1

## 2021-08-29 MED ORDER — PHENYLEPHRINE HCL (PRESSORS) 10 MG/ML IV SOLN
INTRAVENOUS | Status: AC
  Filled 2021-08-29: qty 2

## 2021-08-29 MED ORDER — KETOROLAC TROMETHAMINE 30 MG/ML IJ SOLN
INTRAMUSCULAR | Status: DC | PRN
  Administered 2021-08-29: 30 mg via INTRA_ARTICULAR

## 2021-08-29 MED ORDER — PHENYLEPHRINE HCL-NACL 20-0.9 MG/250ML-% IV SOLN
INTRAVENOUS | Status: DC | PRN
  Administered 2021-08-29: 40 ug/min via INTRAVENOUS

## 2021-08-29 MED ORDER — CEFAZOLIN SODIUM-DEXTROSE 2-4 GM/100ML-% IV SOLN
2.0000 g | Freq: Four times a day (QID) | INTRAVENOUS | Status: AC
Start: 2021-08-29 — End: 2021-08-30
  Administered 2021-08-29 – 2021-08-30 (×2): 2 g via INTRAVENOUS
  Filled 2021-08-29 (×2): qty 100

## 2021-08-29 MED ORDER — METOCLOPRAMIDE HCL 5 MG/ML IJ SOLN: 5.0000 mg | Freq: Three times a day (TID) | INTRAMUSCULAR | Status: DC | PRN

## 2021-08-29 MED ORDER — ORAL CARE MOUTH RINSE: 15.0000 mL | Freq: Once | OROMUCOSAL | Status: AC

## 2021-08-29 MED ORDER — ONDANSETRON HCL 4 MG PO TABS: 4.0000 mg | ORAL_TABLET | Freq: Four times a day (QID) | ORAL | Status: DC | PRN

## 2021-08-29 MED ORDER — CEFAZOLIN SODIUM-DEXTROSE 2-4 GM/100ML-% IV SOLN
2.0000 g | INTRAVENOUS | Status: AC
  Administered 2021-08-29: 2 g via INTRAVENOUS
  Filled 2021-08-29: qty 100

## 2021-08-29 MED ORDER — SODIUM CHLORIDE 0.9 % IV SOLN: INTRAVENOUS | Status: DC

## 2021-08-29 MED ORDER — AMLODIPINE BESYLATE 5 MG PO TABS
5.0000 mg | ORAL_TABLET | Freq: Every day | ORAL | Status: DC
  Administered 2021-08-30: 5 mg via ORAL
  Filled 2021-08-29: qty 1

## 2021-08-29 MED ORDER — PROPOFOL 1000 MG/100ML IV EMUL
INTRAVENOUS | Status: AC
  Filled 2021-08-29: qty 100

## 2021-08-29 MED ORDER — VANCOMYCIN HCL IN DEXTROSE 1-5 GM/200ML-% IV SOLN
1000.0000 mg | Freq: Once | INTRAVENOUS | Status: AC
  Administered 2021-08-29: 1000 mg via INTRAVENOUS
  Filled 2021-08-29: qty 200

## 2021-08-29 MED ORDER — PROPOFOL 10 MG/ML IV BOLUS
INTRAVENOUS | Status: DC | PRN
  Administered 2021-08-29: 20 mg via INTRAVENOUS

## 2021-08-29 MED ORDER — FENTANYL CITRATE PF 50 MCG/ML IJ SOSY
50.0000 ug | PREFILLED_SYRINGE | Freq: Once | INTRAMUSCULAR | Status: AC
  Administered 2021-08-29: 100 ug via INTRAVENOUS
  Filled 2021-08-29: qty 2

## 2021-08-29 MED ORDER — 0.9 % SODIUM CHLORIDE (POUR BTL) OPTIME
TOPICAL | Status: DC | PRN
  Administered 2021-08-29: 1000 mL

## 2021-08-29 MED ORDER — ONDANSETRON HCL 4 MG/2ML IJ SOLN: 4.0000 mg | Freq: Four times a day (QID) | INTRAMUSCULAR | Status: DC | PRN

## 2021-08-29 MED ORDER — BUPIVACAINE-EPINEPHRINE (PF) 0.25% -1:200000 IJ SOLN
INTRAMUSCULAR | Status: AC
  Filled 2021-08-29: qty 30

## 2021-08-29 MED ORDER — TRAMADOL HCL 50 MG PO TABS: 50.0000 mg | ORAL_TABLET | Freq: Four times a day (QID) | ORAL | Status: DC | PRN

## 2021-08-29 MED ORDER — POLYETHYLENE GLYCOL 3350 17 G PO PACK: 17.0000 g | PACK | Freq: Every day | ORAL | Status: DC | PRN

## 2021-08-29 MED ORDER — PHENOL 1.4 % MT LIQD: 1.0000 | OROMUCOSAL | Status: DC | PRN

## 2021-08-29 MED ORDER — CHLORHEXIDINE GLUCONATE 0.12 % MT SOLN
15.0000 mL | Freq: Once | OROMUCOSAL | Status: AC
  Administered 2021-08-29: 15 mL via OROMUCOSAL

## 2021-08-29 MED ORDER — POVIDONE-IODINE 10 % EX SWAB
2.0000 "application " | Freq: Once | CUTANEOUS | Status: AC
  Administered 2021-08-29: 2 via TOPICAL

## 2021-08-29 MED ORDER — DEXAMETHASONE SODIUM PHOSPHATE 10 MG/ML IJ SOLN
8.0000 mg | Freq: Once | INTRAMUSCULAR | Status: AC
  Administered 2021-08-29: 10 mg via INTRAVENOUS

## 2021-08-29 MED ORDER — EZETIMIBE 10 MG PO TABS
10.0000 mg | ORAL_TABLET | Freq: Every day | ORAL | Status: DC
  Administered 2021-08-30: 10 mg via ORAL
  Filled 2021-08-29: qty 1

## 2021-08-29 MED ORDER — SODIUM CHLORIDE 0.9 % IR SOLN
Status: DC | PRN
  Administered 2021-08-29: 1000 mL

## 2021-08-29 MED ORDER — MENTHOL 3 MG MT LOZG: 1.0000 | LOZENGE | OROMUCOSAL | Status: DC | PRN

## 2021-08-29 MED ORDER — TAMSULOSIN HCL 0.4 MG PO CAPS
0.4000 mg | ORAL_CAPSULE | ORAL | Status: DC
  Administered 2021-08-29: 0.4 mg via ORAL
  Filled 2021-08-29: qty 1

## 2021-08-29 MED ORDER — DOCUSATE SODIUM 100 MG PO CAPS
100.0000 mg | ORAL_CAPSULE | Freq: Two times a day (BID) | ORAL | Status: DC
  Administered 2021-08-29 – 2021-08-30 (×2): 100 mg via ORAL
  Filled 2021-08-29 (×2): qty 1

## 2021-08-29 MED ORDER — ROPIVACAINE HCL 5 MG/ML IJ SOLN
INTRAMUSCULAR | Status: DC | PRN
  Administered 2021-08-29: 20 mL via PERINEURAL

## 2021-08-29 MED ORDER — ONDANSETRON HCL 4 MG/2ML IJ SOLN
INTRAMUSCULAR | Status: DC | PRN
  Administered 2021-08-29: 4 mg via INTRAVENOUS

## 2021-08-29 MED ORDER — DEXAMETHASONE SODIUM PHOSPHATE 10 MG/ML IJ SOLN
INTRAMUSCULAR | Status: AC
  Filled 2021-08-29: qty 1

## 2021-08-29 MED ORDER — TRANEXAMIC ACID-NACL 1000-0.7 MG/100ML-% IV SOLN
1000.0000 mg | INTRAVENOUS | Status: AC
  Administered 2021-08-29: 1000 mg via INTRAVENOUS
  Filled 2021-08-29: qty 100

## 2021-08-29 MED ORDER — DEXAMETHASONE SODIUM PHOSPHATE 10 MG/ML IJ SOLN
10.0000 mg | Freq: Once | INTRAMUSCULAR | Status: AC
  Administered 2021-08-30: 10 mg via INTRAVENOUS
  Filled 2021-08-29: qty 1

## 2021-08-29 MED ORDER — MORPHINE SULFATE (PF) 2 MG/ML IV SOLN: 0.5000 mg | INTRAVENOUS | Status: DC | PRN

## 2021-08-29 MED ORDER — ACETAMINOPHEN 325 MG PO TABS: 325.0000 mg | ORAL_TABLET | Freq: Four times a day (QID) | ORAL | Status: DC | PRN

## 2021-08-29 MED ORDER — BUPIVACAINE IN DEXTROSE 0.75-8.25 % IT SOLN
INTRATHECAL | Status: DC | PRN
  Administered 2021-08-29: 1.6 mL via INTRATHECAL

## 2021-08-29 MED ORDER — DEXAMETHASONE SODIUM PHOSPHATE 10 MG/ML IJ SOLN
INTRAMUSCULAR | Status: DC | PRN
  Administered 2021-08-29: 5 mg

## 2021-08-29 MED ORDER — TRANEXAMIC ACID-NACL 1000-0.7 MG/100ML-% IV SOLN
1000.0000 mg | Freq: Once | INTRAVENOUS | Status: AC
  Administered 2021-08-29: 1000 mg via INTRAVENOUS
  Filled 2021-08-29: qty 100

## 2021-08-29 MED ORDER — SODIUM CHLORIDE (PF) 0.9 % IJ SOLN
INTRAMUSCULAR | Status: DC | PRN
  Administered 2021-08-29: 30 mL

## 2021-08-29 MED ORDER — METHOCARBAMOL 500 MG IVPB - SIMPLE MED
500.0000 mg | Freq: Four times a day (QID) | INTRAVENOUS | Status: DC | PRN
  Filled 2021-08-29: qty 50

## 2021-08-29 MED ORDER — DIPHENHYDRAMINE HCL 12.5 MG/5ML PO ELIX: 12.5000 mg | ORAL_SOLUTION | ORAL | Status: DC | PRN

## 2021-08-29 MED ORDER — PROPOFOL 10 MG/ML IV BOLUS: INTRAVENOUS | Status: DC | PRN

## 2021-08-29 MED ORDER — STERILE WATER FOR IRRIGATION IR SOLN
Status: DC | PRN
  Administered 2021-08-29: 2000 mL

## 2021-08-29 MED ORDER — ROPIVACAINE HCL 5 MG/ML IJ SOLN: INTRAMUSCULAR | Status: DC | PRN

## 2021-08-29 MED ORDER — BISACODYL 10 MG RE SUPP: 10.0000 mg | Freq: Every day | RECTAL | Status: DC | PRN

## 2021-08-29 MED ORDER — PROPOFOL 10 MG/ML IV BOLUS
INTRAVENOUS | Status: DC | PRN
  Administered 2021-08-29: 100 ug/kg/min via INTRAVENOUS

## 2021-08-29 MED ORDER — PANTOPRAZOLE SODIUM 40 MG PO TBEC
40.0000 mg | DELAYED_RELEASE_TABLET | Freq: Every day | ORAL | Status: DC
  Administered 2021-08-30: 40 mg via ORAL
  Filled 2021-08-29: qty 1

## 2021-08-29 MED ORDER — METHOCARBAMOL 500 MG PO TABS
500.0000 mg | ORAL_TABLET | Freq: Four times a day (QID) | ORAL | Status: DC | PRN
  Administered 2021-08-29 – 2021-08-30 (×3): 500 mg via ORAL
  Filled 2021-08-29 (×3): qty 1

## 2021-08-29 MED ORDER — METOCLOPRAMIDE HCL 5 MG PO TABS: 5.0000 mg | ORAL_TABLET | Freq: Three times a day (TID) | ORAL | Status: DC | PRN

## 2021-08-29 SURGICAL SUPPLY — 59 items
ADH SKN CLS APL DERMABOND .7 (GAUZE/BANDAGES/DRESSINGS) ×1
ATTUNE MED ANAT PAT 38 KNEE (Knees) ×1 IMPLANT
ATTUNE PS FEM LT SZ 5 CEM KNEE (Femur) ×1 IMPLANT
ATTUNE PSRP INSE SZ5 7 KNEE (Insert) ×1 IMPLANT
BAG COUNTER SPONGE SURGICOUNT (BAG) IMPLANT
BAG SPEC THK2 15X12 ZIP CLS (MISCELLANEOUS)
BAG SPNG CNTER NS LX DISP (BAG)
BAG ZIPLOCK 12X15 (MISCELLANEOUS) IMPLANT
BASE TIBIAL ROT PLAT SZ 5 KNEE (Knees) IMPLANT
BLADE SAW SGTL 11.0X1.19X90.0M (BLADE) IMPLANT
BLADE SAW SGTL 13.0X1.19X90.0M (BLADE) ×2 IMPLANT
BLADE SURG SZ10 CARB STEEL (BLADE) ×4 IMPLANT
BNDG CMPR MED 10X6 ELC LF (GAUZE/BANDAGES/DRESSINGS) ×1
BNDG ELASTIC 6X10 VLCR STRL LF (GAUZE/BANDAGES/DRESSINGS) ×1 IMPLANT
BNDG ELASTIC 6X5.8 VLCR STR LF (GAUZE/BANDAGES/DRESSINGS) ×2 IMPLANT
BOWL SMART MIX CTS (DISPOSABLE) ×2 IMPLANT
BSPLAT TIB 5 CMNT ROT PLAT STR (Knees) ×1 IMPLANT
CEMENT HV SMART SET (Cement) ×2 IMPLANT
CUFF TOURN SGL QUICK 34 (TOURNIQUET CUFF) ×2
CUFF TRNQT CYL 34X4.125X (TOURNIQUET CUFF) ×1 IMPLANT
DECANTER SPIKE VIAL GLASS SM (MISCELLANEOUS) ×4 IMPLANT
DERMABOND ADVANCED (GAUZE/BANDAGES/DRESSINGS) ×1
DERMABOND ADVANCED .7 DNX12 (GAUZE/BANDAGES/DRESSINGS) ×1 IMPLANT
DRAPE INCISE IOBAN 66X45 STRL (DRAPES) ×6 IMPLANT
DRAPE U-SHAPE 47X51 STRL (DRAPES) ×2 IMPLANT
DRESSING AQUACEL AG SP 3.5X10 (GAUZE/BANDAGES/DRESSINGS) ×1 IMPLANT
DRSG AQUACEL AG ADV 3.5X10 (GAUZE/BANDAGES/DRESSINGS) ×1 IMPLANT
DRSG AQUACEL AG SP 3.5X10 (GAUZE/BANDAGES/DRESSINGS) ×2
DURAPREP 26ML APPLICATOR (WOUND CARE) ×4 IMPLANT
ELECT REM PT RETURN 15FT ADLT (MISCELLANEOUS) ×2 IMPLANT
GLOVE SURG ENC MOIS LTX SZ6 (GLOVE) IMPLANT
GLOVE SURG UNDER LTX SZ7.5 (GLOVE) ×2 IMPLANT
GLOVE SURG UNDER POLY LF SZ6.5 (GLOVE) IMPLANT
GLOVE SURG UNDER POLY LF SZ7.5 (GLOVE) ×2 IMPLANT
GOWN STRL REUS W/TWL LRG LVL3 (GOWN DISPOSABLE) ×2 IMPLANT
HANDPIECE INTERPULSE COAX TIP (DISPOSABLE) ×2
HOLDER FOLEY CATH W/STRAP (MISCELLANEOUS) IMPLANT
KIT TURNOVER KIT A (KITS) ×2 IMPLANT
MANIFOLD NEPTUNE II (INSTRUMENTS) ×2 IMPLANT
NDL SAFETY ECLIPSE 18X1.5 (NEEDLE) IMPLANT
NEEDLE HYPO 18GX1.5 SHARP (NEEDLE)
NS IRRIG 1000ML POUR BTL (IV SOLUTION) ×2 IMPLANT
PACK TOTAL KNEE CUSTOM (KITS) ×2 IMPLANT
PIN DRILL FIX HALF THREAD (BIT) ×1 IMPLANT
PIN FIX SIGMA LCS THRD HI (PIN) ×1 IMPLANT
PROTECTOR NERVE ULNAR (MISCELLANEOUS) ×2 IMPLANT
SET HNDPC FAN SPRY TIP SCT (DISPOSABLE) ×1 IMPLANT
SET PAD KNEE POSITIONER (MISCELLANEOUS) ×2 IMPLANT
SUT MNCRL AB 4-0 PS2 18 (SUTURE) ×2 IMPLANT
SUT STRATAFIX PDS+ 0 24IN (SUTURE) ×2 IMPLANT
SUT VIC AB 1 CT1 36 (SUTURE) ×2 IMPLANT
SUT VIC AB 2-0 CT1 27 (SUTURE) ×6
SUT VIC AB 2-0 CT1 TAPERPNT 27 (SUTURE) ×3 IMPLANT
SYR 3ML LL SCALE MARK (SYRINGE) ×2 IMPLANT
TIBIAL BASE ROT PLAT SZ 5 KNEE (Knees) ×2 IMPLANT
TRAY FOLEY MTR SLVR 16FR STAT (SET/KITS/TRAYS/PACK) ×2 IMPLANT
TUBE SUCTION HIGH CAP CLEAR NV (SUCTIONS) ×2 IMPLANT
WATER STERILE IRR 1000ML POUR (IV SOLUTION) ×4 IMPLANT
WRAP KNEE MAXI GEL POST OP (GAUZE/BANDAGES/DRESSINGS) ×2 IMPLANT

## 2021-08-29 NOTE — Transfer of Care (Signed)
Immediate Anesthesia Transfer of Care Note  Patient: Joshua Wyatt  Procedure(s) Performed: TOTAL KNEE ARTHROPLASTY (Left: Knee)  Patient Location: PACU  Anesthesia Type:Spinal  Level of Consciousness: drowsy and patient cooperative  Airway & Oxygen Therapy: Patient Spontanous Breathing and Patient connected to face mask oxygen  Post-op Assessment: Report given to RN and Post -op Vital signs reviewed and stable  Post vital signs: Reviewed and stable  Last Vitals:  Vitals Value Taken Time  BP 130/61 08/29/21 1411  Temp    Pulse 61 08/29/21 1413  Resp 11 08/29/21 1413  SpO2 97 % 08/29/21 1413  Vitals shown include unvalidated device data.  Last Pain:  Vitals:   08/29/21 1211  TempSrc:   PainSc: 0-No pain      Patients Stated Pain Goal: 4 (37/02/30 1720)  Complications: No notable events documented.

## 2021-08-29 NOTE — Anesthesia Postprocedure Evaluation (Signed)
Anesthesia Post Note  Patient: Joshua Wyatt  Procedure(s) Performed: TOTAL KNEE ARTHROPLASTY (Left: Knee)     Patient location during evaluation: PACU Anesthesia Type: MAC Level of consciousness: awake and alert Pain management: pain level controlled Vital Signs Assessment: post-procedure vital signs reviewed and stable Respiratory status: spontaneous breathing and respiratory function stable Cardiovascular status: stable Postop Assessment: no apparent nausea or vomiting Anesthetic complications: no   No notable events documented.  Last Vitals:  Vitals:   08/29/21 1500 08/29/21 1530  BP: (!) 172/82 (!) 196/96  Pulse: 68 70  Resp: 11 15  Temp:    SpO2:  100%    Last Pain:  Vitals:   08/29/21 1643  TempSrc:   PainSc: 2                  [name]

## 2021-08-29 NOTE — Anesthesia Procedure Notes (Signed)
Anesthesia Regional Block: Adductor canal block   Pre-Anesthetic Checklist: , timeout performed,  Correct Patient, Correct Site, Correct Laterality,  Correct Procedure, Correct Position, site marked,  Risks and benefits discussed,  Surgical consent,  Pre-op evaluation,  At surgeon's request and post-op pain management  Laterality: Left  Prep: chloraprep       Needles:  Injection technique: Single-shot  Needle Type: Stimulator Needle - 80     Needle Length: 10cm  Needle Gauge: 21     Additional Needles:   Narrative:  Start time: 08/29/2021 11:52 AM End time: 08/29/2021 12:01 PM Injection made incrementally with aspirations every 5 mL.  Performed by: Personally  Anesthesiologist: Duane Boston, MD

## 2021-08-29 NOTE — Discharge Instructions (Signed)

## 2021-08-29 NOTE — Progress Notes (Signed)
Assisted Dr. Tobias Alexander with left, ultrasound guided, adductor canal block. Side rails up, monitors on throughout procedure. See vital signs in flow sheet. Tolerated Procedure well.

## 2021-08-29 NOTE — Interval H&P Note (Signed)
History and Physical Interval Note:  08/29/2021 11:12 AM  Joshua Wyatt  has presented today for surgery, with the diagnosis of Left knee osteoarthritis.  The various methods of treatment have been discussed with the patient and family. After consideration of risks, benefits and other options for treatment, the patient has consented to  Procedure(s): TOTAL KNEE ARTHROPLASTY (Left) as a surgical intervention.  The patient's history has been reviewed, patient examined, no change in status, stable for surgery.  I have reviewed the patient's chart and labs.  Questions were answered to the patient's satisfaction.     Mauri Pole

## 2021-08-29 NOTE — Plan of Care (Signed)

## 2021-08-29 NOTE — Anesthesia Procedure Notes (Signed)
Spinal  Patient location during procedure: OR Start time: 08/29/2021 12:28 PM End time: 08/29/2021 12:31 PM Reason for block: surgical anesthesia Staffing Performed: resident/CRNA  Resident/CRNA: [place] Nikhita Mentzel C, CRNA Preanesthetic Checklist Completed: patient identified, IV checked, site marked, risks and benefits discussed, surgical consent, monitors and equipment checked, pre-op evaluation and timeout performed Spinal Block Patient position: sitting Prep: DuraPrep and site prepped and draped Patient monitoring: heart rate, cardiac monitor, continuous pulse ox and blood pressure Approach: midline Location: L3-4 Injection technique: single-shot Needle Needle type: Pencan  Needle gauge: 24 G Needle length: 9 cm Assessment Sensory level: T4 Events: CSF return Additional Notes IV functioning, monitors applied to pt. Expiration date of kit checked and confirmed to be in date. Sterile prep and drape, hand hygiene and sterile gloved used. Pt was positioned and spine was prepped in sterile fashion. Skin was anesthetized with lidocaine. Free flow of clear CSF obtained prior to injecting local anesthetic into CSF x 1 attempt. Spinal needle aspirated freely following injection. Needle was carefully withdrawn, and pt tolerated procedure well. Loss of motor and sensory on exam post injection.

## 2021-08-29 NOTE — Plan of Care (Signed)
  Problem: Pain Management: Goal: Pain level will decrease with appropriate interventions Outcome: Progressing   

## 2021-08-29 NOTE — Care Plan (Signed)
Ortho Bundle Case Management Note  Patient Details  Name: DELANTE KARAPETYAN MRN: [id number] Date of Birth: 06-Sep-1946                  L TKA on 08-29-21 DCP: Home with friend. 1 story home with 2 ste. DME: Has a RW. 3-in-1 ordered through [place] PT: EO. PT eval scheduled on 09-01-21.   DME Arranged:  3-N-1 DME Agency:  [employer]  HH Arranged:    [place] Agency:     Additional Comments: Please contact me with any questions of if this plan should need to change.  Marianne Sofia, RN,CCM [facility]  [phone] 08/29/2021, 12:27 PM

## 2021-08-29 NOTE — Op Note (Signed)
NAME:  [name] RECORD NO.:  [id number]                             FACILITY:  [facility]      PHYSICIAN:  Joshua Wyatt, M.D.  DATE OF BIRTH:  Feb 09, 1946      DATE OF PROCEDURE:  08/29/2021                                     OPERATIVE REPORT         PREOPERATIVE DIAGNOSIS:  Left knee osteoarthritis.      POSTOPERATIVE DIAGNOSIS:  Left knee osteoarthritis.      FINDINGS:  The patient was noted to have complete loss of cartilage and   bone-on-bone arthritis with associated osteophytes in the medial and patellofemoral compartments of   the knee with a significant synovitis and associated effusion.  The patient had failed months of conservative treatment including medications, injection therapy, activity modification.     PROCEDURE:  Left total knee replacement.      COMPONENTS USED:  DePuy Attune rotating platform posterior stabilized knee   system, a size 5 femur, 5 tibia, size 7 mm PS AOX insert, and 38 anatomic patellar   button.      SURGEON:  Joshua Wyatt, M.D.      ASSISTANT:  Costella Hatcher, PA-C.      ANESTHESIA:  Regional and Spinal.      SPECIMENS:  None.      COMPLICATION:  None.      DRAINS:  None.  EBL: <50 cc      TOURNIQUET TIME:  29 min at 250 mmHg     The patient was stable to the recovery room.      INDICATION FOR PROCEDURE:  Joshua Wyatt is a 74 y.o. male patient of   mine.  The patient had been seen, evaluated, and treated for months conservatively in the   office with medication, activity modification, and injections.  The patient had   radiographic changes of bone-on-bone arthritis with endplate sclerosis and osteophytes noted.  Based on the radiographic changes and failed conservative measures, the patient   decided to proceed with definitive treatment, total knee replacement.  Risks of infection, DVT, component failure, need for revision surgery, neurovascular injury were reviewed in the office setting.  The  postop course was reviewed stressing the efforts to maximize post-operative satisfaction and function.  Consent was obtained for benefit of pain   relief.      PROCEDURE IN DETAIL:  The patient was brought to the operative theater.   Once adequate anesthesia, preoperative antibiotics, 2 gm of Ancef,1 gm of Tranexamic Acid, and 10 mg of Decadron administered, the patient was positioned supine with a left thigh tourniquet placed.  The  left lower extremity was prepped and draped in sterile fashion.  A time-   out was performed identifying the patient, planned procedure, and the appropriate extremity.      The left lower extremity was placed in the [facility] leg holder.  The leg was   exsanguinated, tourniquet elevated to 250 mmHg.  A midline incision was   made followed by median parapatellar arthrotomy.  Following initial   exposure, attention was  first directed to the patella.  Precut   measurement was noted to be 24 mm.  I resected down to 14 mm and used a   38 anatomic patellar button to restore patellar height as well as cover the cut surface.      The lug holes were drilled and a metal shim was placed to protect the   patella from retractors and saw blade during the procedure.      At this point, attention was now directed to the femur.  The femoral   canal was opened with a drill, irrigated to try to prevent fat emboli.  An   intramedullary rod was passed at 5 degrees valgus, 9 mm of bone was   resected off the distal femur.  Following this resection, the tibia was   subluxated anteriorly.  Using the extramedullary guide, 2 mm of bone was resected off   the proximal medial tibia.  We confirmed the gap would be   stable medially and laterally with a size 5 spacer block as well as confirmed that the tibial cut was perpendicular in the coronal plane, checking with an alignment rod.      Once this was done, I sized the femur to be a size 5 in the anterior-   posterior dimension, chose a  standard component based on medial and   lateral dimension.  The size 5 rotation block was then pinned in   position anterior referenced using the C-clamp to set rotation.  The   anterior, posterior, and  chamfer cuts were made without difficulty nor   notching making certain that I was along the anterior cortex to help   with flexion gap stability.      The final box cut was made off the lateral aspect of distal femur.      At this point, the tibia was sized to be a size 5.  The size 5 tray was   then pinned in position through the medial third of the tubercle,   drilled, and keel punched.  Trial reduction was now carried with a 5 femur,  5 tibia, a size 7 mm PS insert, and the 38 anatomic patella botton.  The knee was brought to full extension with good flexion stability with the patella   tracking through the trochlea without application of pressure.  Given   all these findings the trial components removed.  Final components were   opened and cement was mixed.  The knee was irrigated with normal saline solution and pulse lavage.  The synovial lining was   then injected with 30 cc of 0.25% Marcaine with epinephrine, 1 cc of Toradol and 30 cc of NS for a total of 61 cc.     Final implants were then cemented onto cleaned and dried cut surfaces of bone with the knee brought to extension with a size 7 mm PS trial insert.      Once the cement had fully cured, excess cement was removed   throughout the knee.  I confirmed that I was satisfied with the range of   motion and stability, and the final size 7 mm PS AOX insert was chosen.  It was   placed into the knee.      The tourniquet had been let down at 29 minutes.  No significant   hemostasis was required.  The extensor mechanism was then reapproximated using #1 Vicryl and #1 Stratafix sutures with the knee   in flexion.  The  remaining wound was closed with 2-0 Vicryl and running 4-0 Monocryl.   The knee was cleaned, dried, dressed  sterilely using Dermabond and   Aquacel dressing.  The patient was then   brought to recovery room in stable condition, tolerating the procedure   well.   Please note that Physician Assistant, Costella Hatcher, PA-C was present for the entirety of the case, and was utilized for pre-operative positioning, peri-operative retractor management, general facilitation of the procedure and for primary wound closure at the end of the case.              Joshua Cassis Alvan Wyatt, M.D.    08/29/2021 12:28 PM

## 2021-08-29 NOTE — Anesthesia Preprocedure Evaluation (Addendum)
Anesthesia Evaluation  Patient identified by MRN, date of birth, ID band Patient awake    Reviewed: Allergy & Precautions, NPO status , Patient's Chart, lab work & pertinent test results  History of Anesthesia Complications Negative for: history of anesthetic complications  Airway Mallampati: I  TM Distance: >3 FB Neck ROM: Full    Dental  (+) Upper Dentures, Lower Dentures, Dental Advisory Given   Pulmonary former smoker,    Pulmonary exam normal        Cardiovascular hypertension, + angina + CAD, + Cardiac Stents and + Peripheral Vascular Disease  Normal cardiovascular exam  IMPRESSIONS    1. Left ventricular ejection fraction, by estimation, is 50 to 55%. The  left ventricle has low normal function. The left ventricle has no regional  wall motion abnormalities. Left ventricular diastolic parameters are  indeterminate.  2. Right ventricular systolic function is normal. The right ventricular  size is normal. Tricuspid regurgitation signal is inadequate for assessing  PA pressure.  3. Left atrial size was moderately dilated.  4. The mitral valve is abnormal. Mild mitral valve regurgitation. No  evidence of mitral stenosis.  5. The aortic valve has an indeterminant number of cusps. There is  moderate calcification of the aortic valve. There is moderate thickening  of the aortic valve. Aortic valve regurgitation is not visualized. Mild  aortic valve stenosis.   Comparison(s): Prior images reviewed side by side. Changes from prior  study are noted. Anterior MV leaflet calcification and aortic  calcification more pronounced on current study; global EF decreased (but  still low normal) without focal wall motion  abnormalities compared to prior.    Neuro/Psych negative neurological ROS  negative psych ROS   GI/Hepatic Neg liver ROS, GERD  Medicated and Controlled,  Endo/Other  negative endocrine ROS  Renal/GU Renal  InsufficiencyRenal disease     Musculoskeletal negative musculoskeletal ROS (+)   Abdominal   Peds  Hematology  (+) anemia , HLD   Anesthesia Other Findings   Reproductive/Obstetrics                            Anesthesia Physical  Anesthesia Plan  ASA: 3  Anesthesia Plan: Spinal and MAC   Post-op Pain Management:  Regional for Post-op pain   Induction:   PONV Risk Score and Plan: 1 and Ondansetron and Propofol infusion  Airway Management Planned: Simple Face Mask  Additional Equipment:   Intra-op Plan:   Post-operative Plan:   Informed Consent: I have reviewed the patients History and Physical, chart, labs and discussed the procedure including the risks, benefits and alternatives for the proposed anesthesia with the patient or authorized representative who has indicated his/her understanding and acceptance.     Dental advisory given  Plan Discussed with: CRNA and Anesthesiologist  Anesthesia Plan Comments: (Reviewed PAT note 04/24/19, Konrad Felix, PA-C)       Anesthesia Quick Evaluation

## 2021-08-30 DIAGNOSIS — I129 Hypertensive chronic kidney disease with stage 1 through stage 4 chronic kidney disease, or unspecified chronic kidney disease: Secondary | ICD-10-CM | POA: Diagnosis not present

## 2021-08-30 DIAGNOSIS — Z79899 Other long term (current) drug therapy: Secondary | ICD-10-CM | POA: Diagnosis not present

## 2021-08-30 DIAGNOSIS — M1712 Unilateral primary osteoarthritis, left knee: Secondary | ICD-10-CM | POA: Diagnosis not present

## 2021-08-30 DIAGNOSIS — Z87891 Personal history of nicotine dependence: Secondary | ICD-10-CM | POA: Diagnosis not present

## 2021-08-30 DIAGNOSIS — I2510 Atherosclerotic heart disease of native coronary artery without angina pectoris: Secondary | ICD-10-CM | POA: Diagnosis not present

## 2021-08-30 DIAGNOSIS — Z7982 Long term (current) use of aspirin: Secondary | ICD-10-CM | POA: Diagnosis not present

## 2021-08-30 DIAGNOSIS — N183 Chronic kidney disease, stage 3 unspecified: Secondary | ICD-10-CM | POA: Diagnosis not present

## 2021-08-30 DIAGNOSIS — Z96651 Presence of right artificial knee joint: Secondary | ICD-10-CM | POA: Diagnosis not present

## 2021-08-30 LAB — BASIC METABOLIC PANEL
Anion gap: 7 (ref 5–15)
BUN: 20 mg/dL (ref 8–23)
CO2: 24 mmol/L (ref 22–32)
Calcium: 9.7 mg/dL (ref 8.9–10.3)
Chloride: 101 mmol/L (ref 98–111)
Creatinine, Ser: 1.26 mg/dL — ABNORMAL HIGH (ref 0.61–1.24)
GFR, Estimated: 59 mL/min — ABNORMAL LOW (ref >60)
Glucose, Bld: 140 mg/dL — ABNORMAL HIGH (ref 70–99)
Potassium: 4.3 mmol/L (ref 3.5–5.1)
Sodium: 132 mmol/L — ABNORMAL LOW (ref 135–145)

## 2021-08-30 LAB — CBC
HCT: 35.3 % — ABNORMAL LOW (ref 39.0–52.0)
Hemoglobin: 12 g/dL — ABNORMAL LOW (ref 13.0–17.0)
MCH: 29.8 pg (ref 26.0–34.0)
MCHC: 34 g/dL (ref 30.0–36.0)
MCV: 87.6 fL (ref 80.0–100.0)
Platelets: 234 10*3/uL (ref 150–400)
RBC: 4.03 MIL/uL — ABNORMAL LOW (ref 4.22–5.81)
RDW: 11.9 % (ref 11.5–15.5)
WBC: 19.6 10*3/uL — ABNORMAL HIGH (ref 4.0–10.5)
nRBC: 0 % (ref 0.0–0.2)

## 2021-08-30 MED ORDER — POLYETHYLENE GLYCOL 3350 17 G PO PACK: 17.0000 g | PACK | Freq: Every day | ORAL | 0 refills | Status: DC | PRN

## 2021-08-30 MED ORDER — ASPIRIN 81 MG PO CHEW: 81.0000 mg | CHEWABLE_TABLET | Freq: Two times a day (BID) | ORAL | 0 refills | Status: AC

## 2021-08-30 MED ORDER — METHOCARBAMOL 500 MG PO TABS: 500.0000 mg | ORAL_TABLET | Freq: Four times a day (QID) | ORAL | 0 refills | Status: DC | PRN

## 2021-08-30 MED ORDER — HYDROCODONE-ACETAMINOPHEN 5-325 MG PO TABS: 1.0000 | ORAL_TABLET | ORAL | 0 refills | Status: DC | PRN

## 2021-08-30 MED ORDER — DOCUSATE SODIUM 100 MG PO CAPS: 100.0000 mg | ORAL_CAPSULE | Freq: Two times a day (BID) | ORAL | 0 refills | Status: DC

## 2021-08-30 NOTE — Progress Notes (Signed)
Physical Therapy Treatment Patient Details Name: Joshua Wyatt MRN: [id number] DOB: [birth year] Today's Date: 08/30/2021   History of Present Illness Pt is a 75 year old male s/p Lt TKA on 08/29/21.  PMH includes R TKA 05/06/19, CAD s/p coronary artery stent placement, second degree AV block, PAD, CKD, bilateral carotid artery disease, HLD, LE stent placement    PT Comments    Pt eager to practice steps and d/c home today.  Pt also ambulated however short distance as pt wanted to wait until after therapy to take his pain meds.  Pt reports understanding of stairs and HEP and feels ready for d/c home today.    Recommendations for follow up therapy are one component of a multi-disciplinary discharge planning process, led by the attending physician.  Recommendations may be updated based on patient status, additional functional criteria and insurance authorization.  Follow Up Recommendations  Follow surgeon's recommendation for DC plan and follow-up therapies     Equipment Recommendations  None recommended by PT    Recommendations for Other Services       Precautions / Restrictions Precautions Precautions: Fall;Knee Restrictions LLE Weight Bearing: Weight bearing as tolerated     Mobility  Bed Mobility Overal bed mobility: Needs Assistance Bed Mobility: Supine to Sit     Supine to sit: Supervision;HOB elevated     General bed mobility comments: pt in recliner    Transfers Overall transfer level: Needs assistance Equipment used: Rolling walker (2 wheeled) Transfers: Sit to/from Stand Sit to Stand: Min guard         General transfer comment: cues for UE and LE positioning  Ambulation/Gait Ambulation/Gait assistance: Min guard Gait Distance (Feet): 30 Feet Assistive device: Rolling walker (2 wheeled) Gait Pattern/deviations: Step-to pattern;Decreased stance time - left;Antalgic Gait velocity: decr   General Gait Details: verbal cues for sequence, RW positioning,  posture, step length   Stairs Stairs: Yes Stairs assistance: Min guard Stair Management: Step to pattern;Forwards;With cane;One rail Right Number of Stairs: 2 General stair comments: verbal cues for sequence and safety; pt reports understanding   Wheelchair Mobility    Modified Rankin (Stroke Patients Only)       Balance                                            Cognition Arousal/Alertness: Awake/alert Behavior During Therapy: WFL for tasks assessed/performed Overall Cognitive Status: Within Functional Limits for tasks assessed                                 General Comments: a little impulsive to start however improved with session      Exercises     General Comments        Pertinent Vitals/Pain Pain Assessment: 0-10 Pain Score: 7  Pain Location: posterior left knee Pain Descriptors / Indicators: Sore Pain Intervention(s): Patient requesting pain meds-RN notified;Monitored during session;Repositioned    Home Living Family/patient expects to be discharged to:: Private residence Living Arrangements: Alone Available Help at Discharge: Family Type of Home: House Home Access: Stairs to enter Entrance Stairs-Rails: Left Home Layout: One level Home Equipment: Environmental consultant - 2 wheels;Cane - single point Additional Comments: pt plans to d/c home with daughter    Prior Function Level of Independence: Independent  PT Goals (current goals can now be found in the care plan section) Acute Rehab PT Goals PT Goal Formulation: With patient Time For Goal Achievement: 09/04/21 Potential to Achieve Goals: Good Progress towards PT goals: Progressing toward goals    Frequency    7X/week      PT Plan Current plan remains appropriate    Co-evaluation              AM-PAC PT "6 Clicks" Mobility   Outcome Measure  Help needed turning from your back to your side while in a flat bed without using bedrails?: A Little Help  needed moving from lying on your back to sitting on the side of a flat bed without using bedrails?: A Little Help needed moving to and from a bed to a chair (including a wheelchair)?: A Little Help needed standing up from a chair using your arms (e.g., wheelchair or bedside chair)?: A Little Help needed to walk in hospital room?: A Little Help needed climbing 3-5 steps with a railing? : A Little 6 Click Score: 18    End of Session Equipment Utilized During Treatment: Gait belt Activity Tolerance: Patient tolerated treatment well Patient left: in chair;with call bell/phone within reach;with chair alarm set Nurse Communication: Mobility status PT Visit Diagnosis: Other abnormalities of gait and mobility (R26.89)     Time: 1753-0104 PT Time Calculation (min) (ACUTE ONLY): 11 min  Charges:  $Gait Training: 8-22 mins                    Arlyce Dice, DPT Acute Rehabilitation Services Pager: [phone] Office: [place] 08/30/2021, 3:44 PM

## 2021-08-30 NOTE — Evaluation (Signed)
Physical Therapy Evaluation Patient Details Name: Joshua Wyatt MRN: [id number] DOB: Aug 28, 1946 Today's Date: 08/30/2021  History of Present Illness  Pt is a 75 year old male s/p Lt TKA on 08/29/21.  PMH includes R TKA 05/06/19, CAD s/p coronary artery stent placement, second degree AV block, PAD, CKD, bilateral carotid artery disease, HLD, LE stent placement  Clinical Impression  Pt is s/p TKA resulting in the deficits listed below (see PT Problem List). Pt will benefit from skilled PT to increase their independence and safety with mobility to allow discharge to the venue listed below.  Pt assisted with ambulating in hallway and performed LE exercises.  Pt plans to d/c home with daughter.  Pt will need to practice steps prior to d/c.        Recommendations for follow up therapy are one component of a multi-disciplinary discharge planning process, led by the attending physician.  Recommendations may be updated based on patient status, additional functional criteria and insurance authorization.  Follow Up Recommendations Follow surgeon's recommendation for DC plan and follow-up therapies    Equipment Recommendations  None recommended by PT    Recommendations for Other Services       Precautions / Restrictions Precautions Precautions: Fall;Knee Restrictions LLE Weight Bearing: Weight bearing as tolerated      Mobility  Bed Mobility Overal bed mobility: Needs Assistance Bed Mobility: Supine to Sit     Supine to sit: Supervision;HOB elevated          Transfers Overall transfer level: Needs assistance Equipment used: Rolling walker (2 wheeled) Transfers: Sit to/from Stand Sit to Stand: Min guard         General transfer comment: cues for waiting for RW, cues for UE and LE positioning  Ambulation/Gait Ambulation/Gait assistance: Min guard Gait Distance (Feet): 80 Feet Assistive device: Rolling walker (2 wheeled) Gait Pattern/deviations: Step-to pattern;Decreased  stance time - left;Antalgic Gait velocity: decr   General Gait Details: verbal cues for sequence, RW positioning, posture, step length  Stairs            Wheelchair Mobility    Modified Rankin (Stroke Patients Only)       Balance                                             Pertinent Vitals/Pain Pain Assessment: 0-10 Pain Score: 5  Pain Location: posterior left knee Pain Descriptors / Indicators: Sore Pain Intervention(s): Monitored during session;Repositioned;Patient requesting pain meds-RN notified    Home Living Family/patient expects to be discharged to:: Private residence Living Arrangements: Alone Available Help at Discharge: Family Type of Home: House Home Access: Stairs to enter Entrance Stairs-Rails: Left Entrance Stairs-Number of Steps: 2 Home Layout: One level Home Equipment: Environmental consultant - 2 wheels;Cane - single point Additional Comments: pt plans to d/c home with daughter    Prior Function Level of Independence: Independent               Hand Dominance        Extremity/Trunk Assessment        Lower Extremity Assessment Lower Extremity Assessment: LLE deficits/detail LLE Deficits / Details: approx 30* AAROM knee flexion       Communication   Communication: No difficulties  Cognition Arousal/Alertness: Awake/alert Behavior During Therapy: WFL for tasks assessed/performed Overall Cognitive Status: Within Functional Limits for tasks assessed  General Comments: a little impulsive to start however improved with session      General Comments      Exercises Total Joint Exercises Ankle Circles/Pumps: AROM;Both;10 reps Quad Sets: AROM;Both;10 reps Heel Slides: AAROM;Left;10 reps Hip ABduction/ADduction: AROM;Left;10 reps Straight Leg Raises: AAROM;Left;10 reps Long Arc Quad: AROM;Left;10 reps;Seated Knee Flexion: AROM;Left;Seated;10 reps   Assessment/Plan    PT  Assessment Patient needs continued PT services  PT Problem List Decreased strength;Decreased mobility;Decreased range of motion;Decreased knowledge of use of DME;Pain;Decreased activity tolerance;Decreased knowledge of precautions       PT Treatment Interventions Stair training;Gait training;DME instruction;Therapeutic exercise;Balance training;Functional mobility training;Patient/family education;Therapeutic activities    PT Goals (Current goals can be found in the Care Plan section)  Acute Rehab PT Goals PT Goal Formulation: With patient Time For Goal Achievement: 09/04/21 Potential to Achieve Goals: Good    Frequency 7X/week   Barriers to discharge        Co-evaluation               AM-PAC PT "6 Clicks" Mobility  Outcome Measure Help needed turning from your back to your side while in a flat bed without using bedrails?: A Little Help needed moving from lying on your back to sitting on the side of a flat bed without using bedrails?: A Little Help needed moving to and from a bed to a chair (including a wheelchair)?: A Little Help needed standing up from a chair using your arms (e.g., wheelchair or bedside chair)?: A Little Help needed to walk in hospital room?: A Little Help needed climbing 3-5 steps with a railing? : A Little 6 Click Score: 18    End of Session Equipment Utilized During Treatment: Gait belt Activity Tolerance: Patient tolerated treatment well Patient left: in chair;with call bell/phone within reach;with chair alarm set Nurse Communication: Mobility status PT Visit Diagnosis: Other abnormalities of gait and mobility (R26.89)    Time: 2947-6546 PT Time Calculation (min) (ACUTE ONLY): 17 min   Charges:   PT Evaluation $PT Eval Low Complexity: 1 Low     Kati PT, DPT Acute Rehabilitation Services Pager: [phone] Office: [place] 08/30/2021, 1:02 PM

## 2021-08-30 NOTE — TOC Transition Note (Signed)
Transition of Care [facility]) - CM/SW Discharge Note  Patient Details  Name: Joshua Wyatt MRN: [id number] Date of Birth: 1946/01/23  Transition of Care [facility]) CM/SW Contact:  Sherie Don, LCSW Phone Number: 08/30/2021, 9:43 AM  Clinical Narrative: Patient is expected to discharge home after working with PT. CSW met with patient to review discharge plan and needs. Patient will discharge home with OPPT through [facility]. Patient has a rolling walker, but declined the 3N1 as Medicare is limiting coverage/payment for it. TOC signing off.  Final next level of care: OP Rehab Barriers to Discharge: No Barriers Identified  Patient Goals and CMS Choice Patient states their goals for this hospitalization and ongoing recovery are:: Discharge home with [facility] CMS Medicare.gov Compare Post Acute Care list provided to:: Patient Choice offered to / list presented to : NA  Discharge Plan and Services         DME Arranged: N/A DME Agency: NA  Readmission Risk Interventions No flowsheet data found.

## 2021-08-30 NOTE — Progress Notes (Signed)
Subjective: 1 Day Post-Op Procedure(s) (LRB): TOTAL KNEE ARTHROPLASTY (Left) Patient reports pain as mild.   Patient seen in rounds for Dr. Alvan Dame. Patient is well, and has had no acute complaints or problems. No acute events overnight. Foley catheter removed.  We will start therapy today.   Objective: Vital signs in last 24 hours: Temp:  [97.7 F (36.5 C)-99.3 F (37.4 C)] 98.5 F (36.9 C) (09/21 0523) Pulse Rate:  [58-92] 74 (09/21 0523) Resp:  [11-19] 18 (09/21 0523) BP: (111-233)/(62-97) 145/89 (09/21 0523) SpO2:  [93 %-100 %] 95 % (09/21 0523) Weight:  [78.9 kg] 78.9 kg (09/20 1036)  Intake/Output from previous day:  Intake/Output Summary (Last 24 hours) at 08/30/2021 0839 Last data filed at 08/30/2021 0600 Gross per 24 hour  Intake 4359.54 ml  Output 2075 ml  Net 2284.54 ml     Intake/Output this shift: No intake/output data recorded.  Labs: Recent Labs    08/30/21 0315  HGB 12.0*   Recent Labs    08/30/21 0315  WBC 19.6*  RBC 4.03*  HCT 35.3*  PLT 234   Recent Labs    08/30/21 0315  NA 132*  K 4.3  CL 101  CO2 24  BUN 20  CREATININE 1.26*  GLUCOSE 140*  CALCIUM 9.7   No results for input(s): LABPT, INR in the last 72 hours.  Exam: General - Patient is Alert and Oriented Extremity - Neurologically intact Sensation intact distally Intact pulses distally Dorsiflexion/Plantar flexion intact Dressing - dressing C/D/I Motor Function - intact, moving foot and toes well on exam.   Past Medical History:  Diagnosis Date   Arthritis    "LITTLE IN MY LEFT HAND" (05/12/2018)   Bilateral calf pain 03/02/2013   lower ext.doppler-mild arterial insufficiency to lower ext. this is a disease in val   CAD (coronary artery disease)    a. cath 02/24/18 - 90% ost RCA; 40% ost & pro LAD   Cerebral atherosclerosis 03/02/2013   carotid duplex- normal study   Deviated septum    RIGHT   GERD (gastroesophageal reflux disease)    Heart murmur    History of  echocardiogram 08/02/2005   normal study    History of kidney stones    Hyperlipemia    Hypertension 03/30/2013   PV Angiogram- rt. renal artery stent was widely open,50-60% lt. renal artery stenosis,lt. SFA stents patent with high grade above the knee  poplital stenosis   Hyponatremia 02/03/2017   Normal cardiac stress test 03/25/2013   EF 60%, normal stress test ,this is a presurgical  test   Peripheral vascular disease ([place])    PVD (peripheral vascular disease) ([place])    A. 2009: S/P PTA/stent to D SFA. B. 01/2017: angiosculpt atherectomy/drug-eluting balloon angioplasty to L SFA.   Renal artery stenosis ([place]) 03/02/2013   renal artery doppler-this was an abnormal doppler    Assessment/Plan: 1 Day Post-Op Procedure(s) (LRB): TOTAL KNEE ARTHROPLASTY (Left) Active Problems:   S/P total knee arthroplasty, left  Estimated body mass index is 26.85 kg/m as calculated from the following:   Height as of this encounter: 5' 7.5" (1.715 m).   Weight as of this encounter: 78.9 kg. Advance diet Up with therapy D/C IV fluids   Patient's anticipated LOS is less than 2 midnights, meeting these requirements: - Younger than 79 - Lives within 1 hour of care - Has a competent adult at home to recover with post-op recover - NO history of  - Chronic pain requiring opiods  -  Diabetes  - Coronary Artery Disease  - Heart failure  - Heart attack  - Stroke  - DVT/VTE  - Cardiac arrhythmia  - Respiratory Failure/COPD  - Renal failure  - Anemia  - Advanced Liver disease     DVT Prophylaxis - Aspirin Weight bearing as tolerated.  Plan is to go Home after hospital stay. Plan for discharge today following 1-2 sessions of PT as long as they are meeting their goals. Patient is scheduled for OPPT. Follow up in the office in 2 weeks.   Griffith Citron, PA-C Orthopedic Surgery [phone] 08/30/2021, 8:39 AM

## 2021-09-01 DIAGNOSIS — M25562 Pain in left knee: Secondary | ICD-10-CM | POA: Diagnosis not present

## 2021-09-03 ENCOUNTER — Encounter (HOSPITAL_COMMUNITY): Payer: Self-pay | Admitting: Orthopedic Surgery

## 2021-09-06 DIAGNOSIS — M25562 Pain in left knee: Secondary | ICD-10-CM | POA: Diagnosis not present

## 2021-09-07 NOTE — Discharge Summary (Signed)
Physician Discharge Summary   Patient ID: Joshua Wyatt MRN: [id number] DOB/AGE: 02/07/1946 75 y.o.  Admit date: 08/29/2021 Discharge date: 08/30/2021  Primary Diagnosis: left knee osteoarthritis  Admission Diagnoses:  Past Medical History:  Diagnosis Date   Arthritis    "LITTLE IN MY LEFT HAND" (05/12/2018)   Bilateral calf pain 03/02/2013   lower ext.doppler-mild arterial insufficiency to lower ext. this is a disease in val   CAD (coronary artery disease)    a. cath 02/24/18 - 90% ost RCA; 40% ost & pro LAD   Cerebral atherosclerosis 03/02/2013   carotid duplex- normal study   Deviated septum    RIGHT   GERD (gastroesophageal reflux disease)    Heart murmur    History of echocardiogram 08/02/2005   normal study    History of kidney stones    Hyperlipemia    Hypertension 03/30/2013   PV Angiogram- rt. renal artery stent was widely open,50-60% lt. renal artery stenosis,lt. SFA stents patent with high grade above the knee  poplital stenosis   Hyponatremia 02/03/2017   Normal cardiac stress test 03/25/2013   EF 60%, normal stress test ,this is a presurgical  test   Peripheral vascular disease ([place])    PVD (peripheral vascular disease) ([place])    A. 2009: S/P PTA/stent to D SFA. B. 01/2017: angiosculpt atherectomy/drug-eluting balloon angioplasty to L SFA.   Renal artery stenosis ([place]) 03/02/2013   renal artery doppler-this was an abnormal doppler   Discharge Diagnoses:   Active Problems:   S/P total knee arthroplasty, left  Estimated body mass index is 26.85 kg/m as calculated from the following:   Height as of this encounter: 5' 7.5" (1.715 m).   Weight as of this encounter: 78.9 kg.  Procedure:  Procedure(s) (LRB): TOTAL KNEE ARTHROPLASTY (Left)   Consults: None  HPI: Joshua Wyatt is a 75 y.o. male patient of   mine.  The patient had been seen, evaluated, and treated for months conservatively in the   office with medication, activity modification, and injections.   The patient had   radiographic changes of bone-on-bone arthritis with endplate sclerosis and osteophytes noted.  Based on the radiographic changes and failed conservative measures, the patient   decided to proceed with definitive treatment, total knee replacement.  Risks of infection, DVT, component failure, need for revision surgery, neurovascular injury were reviewed in the office setting.  The postop course was reviewed stressing the efforts to maximize post-operative satisfaction and function.  Consent was obtained for benefit of pain   relief.   Laboratory Data: Admission on 08/29/2021, Discharged on 08/30/2021  Component Date Value Ref Range Status   WBC 08/30/2021 19.6 (A) 4.0 - 10.5 K/uL Final   RBC 08/30/2021 4.03 (A) 4.22 - 5.81 MIL/uL Final   Hemoglobin 08/30/2021 12.0 (A) 13.0 - 17.0 g/dL Final   HCT 08/30/2021 35.3 (A) 39.0 - 52.0 % Final   MCV 08/30/2021 87.6  80.0 - 100.0 fL Final   MCH 08/30/2021 29.8  26.0 - 34.0 pg Final   MCHC 08/30/2021 34.0  30.0 - 36.0 g/dL Final   RDW 08/30/2021 11.9  11.5 - 15.5 % Final   Platelets 08/30/2021 234  150 - 400 K/uL Final   REPEATED TO VERIFY   nRBC 08/30/2021 0.0  0.0 - 0.2 % Final   Performed at [facility], [place] [address], [place], [place] [address]   Sodium 08/30/2021 132 (A) 135 - 145 mmol/L Final   Potassium 08/30/2021 4.3  3.5 -  5.1 mmol/L Final   Chloride 08/30/2021 101  98 - 111 mmol/L Final   CO2 08/30/2021 24  22 - 32 mmol/L Final   Glucose, Bld 08/30/2021 140 (A) 70 - 99 mg/dL Final   Glucose reference range applies only to samples taken after fasting for at least 8 hours.   BUN 08/30/2021 20  8 - 23 mg/dL Final   Creatinine, Ser 08/30/2021 1.26 (A) 0.61 - 1.24 mg/dL Final   Calcium 56/43/3295 9.7  8.9 - 10.3 mg/dL Final   GFR, Estimated 08/30/2021 59 (A) >60 mL/min Final   Comment: (NOTE) Calculated using the CKD-EPI Creatinine Equation (2021)    Anion gap 08/30/2021 7  5 - 15 Final   Performed at  [facility], [address], [place], [place] [address]  Appointment on 08/25/2021  Component Date Value Ref Range Status   Area-P 1/2 08/25/2021 3.91  cm2 Final   S' Lateral 08/25/2021 2.80  cm Final   AV Area mean vel 08/25/2021 1.21  cm2 Final   AR max vel 08/25/2021 1.33  cm2 Final   AV Area VTI 08/25/2021 1.29  cm2 Final   Ao pk vel 08/25/2021 2.46  m/s Final   AV Mean grad 08/25/2021 14.0  mmHg Final   AV Peak grad 08/25/2021 24.2  mmHg Final  Hospital Outpatient Visit on 08/17/2021  Component Date Value Ref Range Status   WBC 08/17/2021 8.4  4.0 - 10.5 K/uL Final   RBC 08/17/2021 4.52  4.22 - 5.81 MIL/uL Final   Hemoglobin 08/17/2021 13.5  13.0 - 17.0 g/dL Final   HCT 66/05/3015 41.6  39.0 - 52.0 % Final   MCV 08/17/2021 92.0  80.0 - 100.0 fL Final   MCH 08/17/2021 29.9  26.0 - 34.0 pg Final   MCHC 08/17/2021 32.5  30.0 - 36.0 g/dL Final   RDW 12/18/3233 12.2  11.5 - 15.5 % Final   Platelets 08/17/2021 234  150 - 400 K/uL Final   nRBC 08/17/2021 0.0  0.0 - 0.2 % Final   Performed at [facility], [address], [place], [place] [address]   Sodium 08/17/2021 138  135 - 145 mmol/L Final   Potassium 08/17/2021 4.8  3.5 - 5.1 mmol/L Final   Chloride 08/17/2021 107  98 - 111 mmol/L Final   CO2 08/17/2021 24  22 - 32 mmol/L Final   Glucose, Bld 08/17/2021 91  70 - 99 mg/dL Final   Glucose reference range applies only to samples taken after fasting for at least 8 hours.   BUN 08/17/2021 20  8 - 23 mg/dL Final   Creatinine, Ser 08/17/2021 1.61 (A) 0.61 - 1.24 mg/dL Final   Calcium 02/54/2706 10.8 (A) 8.9 - 10.3 mg/dL Final   Total Protein 23/76/2831 7.6  6.5 - 8.1 g/dL Final   Albumin 51/76/1607 4.3  3.5 - 5.0 g/dL Final   AST 37/09/6268 24  15 - 41 U/L Final   ALT 08/17/2021 15  0 - 44 U/L Final   Alkaline Phosphatase 08/17/2021 123  38 - 126 U/L Final   Total Bilirubin 08/17/2021 0.6  0.3 - 1.2 mg/dL Final   GFR, Estimated 08/17/2021 44  (A) >60 mL/min Final   Comment: (NOTE) Calculated using the CKD-EPI Creatinine Equation (2021)    Anion gap 08/17/2021 7  5 - 15 Final   Performed at [facility], [address] [place], [place] [address]   ABO/RH(D) 08/17/2021 A POS   Final  Antibody Screen 08/17/2021 NEG   Final   Sample Expiration 08/17/2021 08/31/2021,2359   Final   Extend sample reason 08/17/2021    Final                   Value:NO TRANSFUSIONS OR PREGNANCY IN THE PAST 3 MONTHS Performed at [facility] [address], [place], [place] [address]    MRSA, PCR 08/17/2021 POSITIVE (A) NEGATIVE Final   Comment: RESULT CALLED TO, READ BACK BY AND VERIFIED WITH: HESTER,T. RN AT 4401 08/17/21 MULLINS,T    Staphylococcus aureus 08/17/2021 POSITIVE (A) NEGATIVE Final   Comment: (NOTE) The Xpert SA Assay (FDA approved for NASAL specimens in patients 5 years of age and older), is one component of a comprehensive surveillance program. It is not intended to diagnose infection nor to guide or monitor treatment. Performed at [facility], [place] [address], [place], [place] [address]      X-Rays:ECHOCARDIOGRAM COMPLETE  Result Date: 08/25/2021    ECHOCARDIOGRAM REPORT   Patient Name:   Joshua Wyatt Date of Exam: 08/25/2021 Medical Rec #:  [id number]     Height:       67.5 in Accession #:    [id number]    Weight:       174.0 lb Date of Birth:  [birth year]    BSA:          1.917 m Patient Age:    1 years      BP:           165/66 mmHg Patient Gender: M             HR:           67 bpm. Exam Location:  [place] Procedure: 2D Echo, Cardiac Doppler and Color Doppler Indications:    Z01.810 Preoperative cardiovascular examination.                 [email or web address] Bilateral carotid artery stenosis  History:        Patient has prior history of Echocardiogram examinations, most                 recent 05/07/2019. CAD; Risk Factors:Hypertension and                 Dyslipidemia. Peripheral  vascular disease. Murmur.  Sonographer:    Wilford Sports Rodgers-Jones RDCS Referring Phys: Pearletha Forge BERRY IMPRESSIONS  1. Left ventricular ejection fraction, by estimation, is 50 to 55%. The left ventricle has low normal function. The left ventricle has no regional wall motion abnormalities. Left ventricular diastolic parameters are indeterminate.  2. Right ventricular systolic function is normal. The right ventricular size is normal. Tricuspid regurgitation signal is inadequate for assessing PA pressure.  3. Left atrial size was moderately dilated.  4. The mitral valve is abnormal. Mild mitral valve regurgitation. No evidence of mitral stenosis.  5. The aortic valve has an indeterminant number of cusps. There is moderate calcification of the aortic valve. There is moderate thickening of the aortic valve. Aortic valve regurgitation is not visualized. Mild aortic valve stenosis. Comparison(s): Prior images reviewed side by side. Changes from prior study are noted. Anterior MV leaflet calcification and aortic calcification more pronounced on current study; global EF decreased (but still low normal) without focal wall motion abnormalities compared to prior. FINDINGS  Left Ventricle: Left ventricular ejection fraction, by estimation, is 50 to 55%. The left ventricle has low normal function. The left ventricle has no regional wall  motion abnormalities. The left ventricular internal cavity size was normal in size. There is no left ventricular hypertrophy. Left ventricular diastolic parameters are indeterminate. Right Ventricle: The right ventricular size is normal. Right vetricular wall thickness was not well visualized. Right ventricular systolic function is normal. Tricuspid regurgitation signal is inadequate for assessing PA pressure. Left Atrium: Left atrial size was moderately dilated. Right Atrium: Right atrial size was normal in size. Pericardium: There is no evidence of pericardial effusion. Presence of pericardial  fat pad. Mitral Valve: The mitral valve is abnormal. There is moderate calcification of the anterior mitral valve leaflet(s). Mild mitral valve regurgitation. No evidence of mitral valve stenosis. Tricuspid Valve: The tricuspid valve is normal in structure. Tricuspid valve regurgitation is trivial. No evidence of tricuspid stenosis. Aortic Valve: The aortic valve has an indeterminant number of cusps. There is moderate calcification of the aortic valve. There is moderate thickening of the aortic valve. There is moderate aortic valve annular calcification. Aortic valve regurgitation is not visualized. Mild aortic stenosis is present. Aortic valve mean gradient measures 14.0 mmHg. Aortic valve peak gradient measures 24.2 mmHg. Aortic valve area, by VTI measures 1.29 cm. Pulmonic Valve: The pulmonic valve was grossly normal. Pulmonic valve regurgitation is trivial. No evidence of pulmonic stenosis. Aorta: The aortic root and ascending aorta are structurally normal, with no evidence of dilitation and the aortic arch was not well visualized. Venous: The inferior vena cava was not well visualized. IAS/Shunts: The atrial septum is grossly normal.  LEFT VENTRICLE PLAX 2D LVIDd:         4.40 cm  Diastology LVIDs:         2.80 cm  LV e' medial:    6.64 cm/s LV PW:         1.00 cm  LV E/e' medial:  13.8 LV IVS:        1.00 cm  LV e' lateral:   9.03 cm/s LVOT diam:     2.10 cm  LV E/e' lateral: 10.1 LV SV:         74 LV SV Index:   39 LVOT Area:     3.46 cm  RIGHT VENTRICLE             IVC RV Basal diam:  3.10 cm     IVC diam: 1.60 cm RV S prime:     13.40 cm/s TAPSE (M-mode): 1.9 cm LEFT ATRIUM             Index       RIGHT ATRIUM           Index LA diam:        4.80 cm 2.50 cm/m  RA Area:     10.00 cm LA Vol (A2C):   63.6 ml 33.18 ml/m RA Volume:   21.50 ml  11.22 ml/m LA Vol (A4C):   60.1 ml 31.36 ml/m LA Biplane Vol: 62.1 ml 32.40 ml/m  AORTIC VALVE AV Area (Vmax):    1.33 cm AV Area (Vmean):   1.21 cm AV Area  (VTI):     1.29 cm AV Vmax:           246.00 cm/s AV Vmean:          176.800 cm/s AV VTI:            0.573 m AV Peak Grad:      24.2 mmHg AV Mean Grad:      14.0 mmHg LVOT Vmax:  94.30 cm/s LVOT Vmean:        61.900 cm/s LVOT VTI:          0.214 m LVOT/AV VTI ratio: 0.37  AORTA Ao Root diam: 3.10 cm Ao Asc diam:  3.40 cm MITRAL VALVE MV Area (PHT): 3.91 cm    SHUNTS MV Decel Time: 194 msec    Systemic VTI:  0.21 m MV E velocity: 91.60 cm/s  Systemic Diam: 2.10 cm MV A velocity: 92.50 cm/s MV E/A ratio:  0.99 Buford Dresser MD Electronically signed by Buford Dresser MD Signature Date/Time: 08/25/2021/1:46:01 PM    Final     EKG: Orders placed or performed during the hospital encounter of 08/17/21   EKG 12 lead per protocol   EKG 12 lead per protocol     Hospital Course: Joshua Wyatt is a 75 y.o. who was admitted to [facility]. They were brought to the operating room on 08/29/2021 and underwent Procedure(s): TOTAL KNEE ARTHROPLASTY.  Patient tolerated the procedure well and was later transferred to the recovery room and then to the orthopaedic floor for postoperative care. They were given PO and IV analgesics for pain control following their surgery. They were given 24 hours of postoperative antibiotics of  Anti-infectives (From admission, onward)    Start     Dose/Rate Route Frequency Ordered Stop   08/29/21 1830  ceFAZolin (ANCEF) IVPB 2g/100 mL premix        2 g 200 mL/hr over 30 Minutes Intravenous Every 6 hours 08/29/21 1542 08/30/21 0109   08/29/21 1030  ceFAZolin (ANCEF) IVPB 2g/100 mL premix        2 g 200 mL/hr over 30 Minutes Intravenous On call to O.R. 08/29/21 1022 08/29/21 1233   08/29/21 1030  vancomycin (VANCOCIN) IVPB 1000 mg/200 mL premix        1,000 mg 200 mL/hr over 60 Minutes Intravenous  Once 08/29/21 1022 08/29/21 1305      and started on DVT prophylaxis in the form of Aspirin.   PT and OT were ordered for total joint protocol. Discharge  planning consulted to help with postop disposition and equipment needs.  Patient had a good night on the evening of surgery. They started to get up OOB with therapy on POD #1. Pt was seen during rounds and was ready to go home pending progress with therapy.He worked with therapy on POD #1 and was meeting his goals. Pt was discharged to home later that day in stable condition.  Diet: Regular diet Activity: WBAT Follow-up: in 2 weeks Disposition: Home Discharged Condition: good   Discharge Instructions     Call MD / Call 911   Complete by: As directed    If you experience chest pain or shortness of breath, CALL 911 and be transported to the hospital emergency room.  If you develope a fever above 101 F, pus (white drainage) or increased drainage or redness at the wound, or calf pain, call your surgeon's office.   Change dressing   Complete by: As directed    Maintain surgical dressing until follow up in the clinic. If the edges start to pull up, may reinforce with tape. If the dressing is no longer working, may remove and cover with gauze and tape, but must keep the area dry and clean.  Call with any questions or concerns.   Constipation Prevention   Complete by: As directed    Drink plenty of fluids.  Prune juice may be helpful.  You may  use a stool softener, such as Colace (over the counter) 100 mg twice a day.  Use MiraLax (over the counter) for constipation as needed.   Diet - low sodium heart healthy   Complete by: As directed    Increase activity slowly as tolerated   Complete by: As directed    Weight bearing as tolerated with assist device (walker, cane, etc) as directed, use it as long as suggested by your surgeon or therapist, typically at least 4-6 weeks.   Post-operative opioid taper instructions:   Complete by: As directed    POST-OPERATIVE OPIOID TAPER INSTRUCTIONS: It is important to wean off of your opioid medication as soon as possible. If you do not need pain medication  after your surgery it is ok to stop day one. Opioids include: Codeine, Hydrocodone(Norco, Vicodin), Oxycodone(Percocet, oxycontin) and hydromorphone amongst others.  Long term and even short term use of opiods can cause: Increased pain response Dependence Constipation Depression Respiratory depression And more.  Withdrawal symptoms can include Flu like symptoms Nausea, vomiting And more Techniques to manage these symptoms Hydrate well Eat regular healthy meals Stay active Use relaxation techniques(deep breathing, meditating, yoga) Do Not substitute Alcohol to help with tapering If you have been on opioids for less than two weeks and do not have pain than it is ok to stop all together.  Plan to wean off of opioids This plan should start within one week post op of your joint replacement. Maintain the same interval or time between taking each dose and first decrease the dose.  Cut the total daily intake of opioids by one tablet each day Next start to increase the time between doses. The last dose that should be eliminated is the evening dose.      TED hose   Complete by: As directed    Use stockings (TED hose) for 2 weeks on both leg(s).  You may remove them at night for sleeping.      Allergies as of 08/30/2021       Reactions   Statins Swelling, Other (See Comments)   Muscle pain, also        Medication List     STOP taking these medications    aspirin 81 MG tablet Replaced by: aspirin 81 MG chewable tablet   Repatha Pushtronex System 420 MG/3.5ML Soct Generic drug: Evolocumab with Infusor       TAKE these medications    amLODipine 5 MG tablet Commonly known as: NORVASC Take 1 tablet (5 mg total) by mouth daily.   aspirin 81 MG chewable tablet Chew 1 tablet (81 mg total) by mouth 2 (two) times daily for 28 days. Replaces: aspirin 81 MG tablet   docusate sodium 100 MG capsule Commonly known as: COLACE Take 1 capsule (100 mg total) by mouth 2 (two)  times daily.   ezetimibe 10 MG tablet Commonly known as: ZETIA Take 1 tablet by mouth once daily   HYDROcodone-acetaminophen 5-325 MG tablet Commonly known as: NORCO/VICODIN Take 1 tablet by mouth every 4 (four) hours as needed for severe pain (pain score 7-10).   irbesartan 150 MG tablet Commonly known as: AVAPRO Take 1 tablet (150 mg total) by mouth daily.   methocarbamol 500 MG tablet Commonly known as: ROBAXIN Take 1 tablet (500 mg total) by mouth every 6 (six) hours as needed for muscle spasms.   nitroGLYCERIN 0.4 MG SL tablet Commonly known as: Nitrostat Place 1 tablet (0.4 mg total) under the tongue every 5 (five) minutes as  needed for chest pain.   pantoprazole 40 MG tablet Commonly known as: PROTONIX Take 1 tablet by mouth once daily   polyethylene glycol 17 g packet Commonly known as: MIRALAX / GLYCOLAX Take 17 g by mouth daily as needed for mild constipation.   tamsulosin 0.4 MG Caps capsule Commonly known as: FLOMAX Take 0.4 mg by mouth every other day. AT [facility]               Discharge Care Instructions  (From admission, onward)           Start     Ordered   08/30/21 0000  Change dressing       Comments: Maintain surgical dressing until follow up in the clinic. If the edges start to pull up, may reinforce with tape. If the dressing is no longer working, may remove and cover with gauze and tape, but must keep the area dry and clean.  Call with any questions or concerns.   08/30/21 7227            Follow-up Information     Paralee Cancel, MD. Go on 09/13/2021.   Specialty: Orthopedic Surgery Why: You are scheduled for first post op appointment on Wednesday October 5th at 2:30pm. Contact information: [address] [place] [address] [phone]         Rosilyn Mings.. Go on 09/01/2021.   Why: You are scheduled for physical therapy eval on Friday September 23rd at 2:00pm. Contact information: [address] [place] [address] [phone]                 Signed: Griffith Citron, PA-C Orthopedic Surgery 09/07/2021, 7:41 AM

## 2021-09-08 DIAGNOSIS — M25562 Pain in left knee: Secondary | ICD-10-CM | POA: Diagnosis not present

## 2021-09-08 DIAGNOSIS — I10 Essential (primary) hypertension: Secondary | ICD-10-CM | POA: Diagnosis not present

## 2021-09-08 DIAGNOSIS — M810 Age-related osteoporosis without current pathological fracture: Secondary | ICD-10-CM | POA: Diagnosis not present

## 2021-09-08 DIAGNOSIS — E785 Hyperlipidemia, unspecified: Secondary | ICD-10-CM | POA: Diagnosis not present

## 2021-09-08 DIAGNOSIS — K219 Gastro-esophageal reflux disease without esophagitis: Secondary | ICD-10-CM | POA: Diagnosis not present

## 2021-09-12 ENCOUNTER — Other Ambulatory Visit: Payer: Self-pay | Admitting: Cardiovascular Disease

## 2021-09-12 DIAGNOSIS — M25562 Pain in left knee: Secondary | ICD-10-CM | POA: Diagnosis not present

## 2021-09-15 DIAGNOSIS — M25562 Pain in left knee: Secondary | ICD-10-CM | POA: Diagnosis not present

## 2021-09-19 DIAGNOSIS — M25562 Pain in left knee: Secondary | ICD-10-CM | POA: Diagnosis not present

## 2021-09-22 DIAGNOSIS — M25562 Pain in left knee: Secondary | ICD-10-CM | POA: Diagnosis not present

## 2021-09-26 DIAGNOSIS — M25562 Pain in left knee: Secondary | ICD-10-CM | POA: Diagnosis not present

## 2021-09-28 DIAGNOSIS — M25562 Pain in left knee: Secondary | ICD-10-CM | POA: Diagnosis not present

## 2021-10-09 DIAGNOSIS — M810 Age-related osteoporosis without current pathological fracture: Secondary | ICD-10-CM | POA: Diagnosis not present

## 2021-10-09 DIAGNOSIS — K219 Gastro-esophageal reflux disease without esophagitis: Secondary | ICD-10-CM | POA: Diagnosis not present

## 2021-10-09 DIAGNOSIS — E785 Hyperlipidemia, unspecified: Secondary | ICD-10-CM | POA: Diagnosis not present

## 2021-10-09 DIAGNOSIS — I10 Essential (primary) hypertension: Secondary | ICD-10-CM | POA: Diagnosis not present

## 2021-10-17 DIAGNOSIS — M25562 Pain in left knee: Secondary | ICD-10-CM | POA: Diagnosis not present

## 2021-10-18 ENCOUNTER — Encounter (HOSPITAL_COMMUNITY): Payer: Medicare Other

## 2021-10-18 DIAGNOSIS — Z96652 Presence of left artificial knee joint: Secondary | ICD-10-CM | POA: Diagnosis not present

## 2021-10-18 DIAGNOSIS — Z471 Aftercare following joint replacement surgery: Secondary | ICD-10-CM | POA: Diagnosis not present

## 2021-10-24 ENCOUNTER — Other Ambulatory Visit: Payer: Self-pay

## 2021-10-24 ENCOUNTER — Ambulatory Visit: Payer: Medicare Other | Admitting: Cardiovascular Disease

## 2021-10-24 ENCOUNTER — Encounter: Payer: Self-pay | Admitting: Cardiovascular Disease

## 2021-10-24 ENCOUNTER — Telehealth: Payer: Self-pay | Admitting: Cardiovascular Disease

## 2021-10-24 ENCOUNTER — Other Ambulatory Visit: Payer: Self-pay | Admitting: Cardiovascular Disease

## 2021-10-24 DIAGNOSIS — I2510 Atherosclerotic heart disease of native coronary artery without angina pectoris: Secondary | ICD-10-CM | POA: Diagnosis not present

## 2021-10-24 DIAGNOSIS — Z9861 Coronary angioplasty status: Secondary | ICD-10-CM

## 2021-10-24 MED ORDER — SODIUM CHLORIDE 0.9% FLUSH
3.0000 mL | Freq: Two times a day (BID) | INTRAVENOUS | Status: DC
Start: 2021-10-24 — End: 2021-11-10

## 2021-10-24 NOTE — Telephone Encounter (Signed)
Spoke to patient he stated he has been having chest pain off and on for the past 3 weeks.Stated feels like he did when he had heart stents.Stated pain is relieved with NTG.Spoke to Dr.Berry's RN advised ok to add on to Dr.Berry's schedule today at 2:00 pm.

## 2021-10-24 NOTE — Patient Instructions (Signed)
  [place] [address] Dept: [phone] Loc: [place]  10/24/2021  You are scheduled for a Cardiac Catheterization on Thursday, November 17 with Dr. [name].  1. Please arrive at the [facility] (Main Entrance A) at [facility]: [address] [place], [place] [address] at 5:30 AM (This time is two hours before your procedure to ensure your preparation). Free valet parking service is available.   Special note: Every effort is made to have your procedure done on time. Please understand that emergencies sometimes delay scheduled procedures.  2. Diet: Do not eat solid foods after midnight.  The patient may have clear liquids until 5am upon the day of the procedure.  3. Labs: You will need to have blood drawn today.  4. Medication instructions in preparation for your procedure:   On the morning of your procedure, take your Aspirin and any morning medicines NOT listed above.  You may use sips of water.  5. Plan for one night stay--bring personal belongings. 6. Bring a current list of your medications and current insurance cards. 7. You MUST have a responsible person to drive you home. 8. Someone MUST be with you the first 24 hours after you arrive home or your discharge will be delayed. 9. Please wear clothes that are easy to get on and off and wear slip-on shoes.  Thank you for allowing [place] to care for you!   -- [facility]    Follow-Up: At [facility], you and your health needs are our priority.  As part of our continuing mission to provide you with exceptional heart care, we have created designated Provider Care Teams.  These Care Teams include your primary Cardiologist (physician) and Advanced Practice Providers (APPs -  Physician Assistants and Nurse Practitioners) who all work together to provide you  with the care you need, when you need it.  We recommend signing up for the patient portal called "MyChart".  Sign up information is provided on this After Visit Summary.  MyChart is used to connect with patients for Virtual Visits (Telemedicine).  Patients are able to view lab/test results, encounter notes, upcoming appointments, etc.  Non-urgent messages can be sent to your provider as well.   To learn more about what you can do with MyChart, go to [email or web address].    Your next appointment:   2 week(s) after procedure   The format for your next appointment:   In Person  Provider:   [name], MD

## 2021-10-24 NOTE — H&P (View-Only) (Signed)
10/24/2021 Joshua Wyatt   [birth year]  [id number]  Primary Physician Jani Gravel, MD Primary Cardiologist: Lorretta Harp MD FACP, [place], [place], [place]  HPI:  Joshua Wyatt is a 75 y.o.  thin appearing widowed Caucasian male with no children who I last saw in the office 08/08/2021.  Unfortunately, his wife of 24 years died at the age of 57 on 2020-10-24.  He is currently living alone.  He has a history of PVOD status post bilateral carotid endarterectomies performed by Dr. Drucie Opitz in 2007. We have been following him by duplex ultrasound which performed most recently several days ago revealed this to be widely patent. He has also had bilateral SFA intervention as well as right renal artery stenting. His other problems include hypertension and hyperlipidemia. He did have an episode of chest heaviness recently, but more importantly he has complained of progressive lifestyle limiting claudication since I last saw him. Most recent lower extremity Dopplers reveal a right ABI of 0.93 and a left of 0.82. He does appear to have moderate iliac disease as well as high grade right SFA and popliteal stenosis. He underwent abdominal aortography with bifemoral runoff on 03/30/13 revealed a widely patent right renal artery stent, 50-60% proximal left renal artery stenosis. The left SFA was while he stayed patent and he had a 95% focal above-the-knee popliteal artery stenosis with three-vessel runoff. There was a 60% focal lesion in the right common iliac artery and a 50% segmental proximal to mid right SFA stenosis with a patent stent in the mid to distal right SFA. He also had a 50% right popliteal stenosis with three-vessel runoff. He underwent cutting balloon angioplasty of the left above-the-knee popliteal artery and Angiosculpt balloon with an excellent angiographic result and was discharged the following day. I performed angiography on him 10/11/14 and recanalized a subtotally occluded right SFA with overlapping  via Bahn covered stent performed intervention on his above-the-knee popliteal artery on that side. He had a patent mid to distal left SFA stent with high grade segmental and 6 sequential disease in the proximal SFA and distal SFA on the left. Since I saw him a year and a half ago he's had progressive lifestyle limiting claudication in his left calf. Addition, he's had severe progression of disease in his right internal carotid artery. I performed carotid stenting on him 01/03/17 with an excellent result. He underwent left lower extremity angiography by myself 02/03/17 revealing an occluded distal left SFA stent with otherwise high-grade segmental disease proximal to this and occluded anterior tibial. I performed cutting balloon atherectomy of his in-stent restenosis and the surrounding disease excellent angiographic result. His claudication has completely resolved. His left ABI has improved from 0.59 up to 1.   Because of recurrent claudication I re-angiogram him 05/11/2018 revealing a patent mid left SFA stent with high-grade segmental proximal left SFA stenosis which I performed Hawk 1 directional atherectomy followed by drug-eluting balloon angioplasty.  Claudication resolved and his Dopplers have normalized.  He did have RCA intervention by myself 03/13/2018 with a drug-eluting stent.  He currently denies chest pain, shortness of breath or claudication.  He had elective right left total knee replacement by Dr. Gladstone Lighter in June of this year which was uncomplicated.   Since I saw him in the office 2-1/2 months ago he was doing well until 2 to 3 weeks ago when he started noticing exertional substernal chest burning similar to his Faline Langer stent symptoms.  He has taken several neck  nitroglycerin with some relief.   Current Meds  Medication Sig   amLODipine (NORVASC) 5 MG tablet Take 1 tablet (5 mg total) by mouth daily.   ezetimibe (ZETIA) 10 MG tablet Take 1 tablet by mouth once daily   methocarbamol (ROBAXIN) 500  MG tablet Take 1 tablet (500 mg total) by mouth every 6 (six) hours as needed for muscle spasms.   nitroGLYCERIN (NITROSTAT) 0.4 MG SL tablet Place 1 tablet (0.4 mg total) under the tongue every 5 (five) minutes as needed for chest pain.   pantoprazole (PROTONIX) 40 MG tablet Take 1 tablet by mouth once daily   tamsulosin (FLOMAX) 0.4 MG CAPS capsule Take 0.4 mg by mouth every other day. AT HS     Allergies  Allergen Reactions   Statins Swelling and Other (See Comments)    Muscle pain, also    Social History   Socioeconomic History   Marital status: Married    Spouse name: Not on file   Number of children: Not on file   Years of education: Not on file   Highest education level: Not on file  Occupational History   Not on file  Tobacco Use   Smoking status: Former    Packs/day: 1.00    Years: 58.00    Pack years: 58.00    Types: Cigars, Cigarettes    Quit date: 10/18/2013    Years since quitting: 8.0   Smokeless tobacco: Never  Vaping Use   Vaping Use: Never used  Substance and Sexual Activity   Alcohol use: Not Currently   Drug use: Never   Sexual activity: Yes  Other Topics Concern   Not on file  Social History Narrative   Not on file   Social Determinants of Health   Financial Resource Strain: Not on file  Food Insecurity: Not on file  Transportation Needs: Not on file  Physical Activity: Not on file  Stress: Not on file  Social Connections: Not on file  Intimate Partner Violence: Not on file     Review of Systems: General: negative for chills, fever, night sweats or weight changes.  Cardiovascular: negative for chest pain, dyspnea on exertion, edema, orthopnea, palpitations, paroxysmal nocturnal dyspnea or shortness of breath Dermatological: negative for rash Respiratory: negative for cough or wheezing Urologic: negative for hematuria Abdominal: negative for nausea, vomiting, diarrhea, bright red blood per rectum, melena, or hematemesis Neurologic:  negative for visual changes, syncope, or dizziness All other systems reviewed and are otherwise negative except as noted above.    Blood pressure (!) 142/66, pulse 92, height 5' 7.5" (1.715 m), weight 173 lb (78.5 kg), SpO2 96 %.  General appearance: alert and no distress Neck: no adenopathy, no JVD, supple, symmetrical, trachea midline, thyroid not enlarged, symmetric, no tenderness/mass/nodules, and soft bilateral carotid bruits Lungs: clear to auscultation bilaterally Heart: regular rate and rhythm, S1, S2 normal, no murmur, click, rub or gallop Extremities: extremities normal, atraumatic, no cyanosis or edema Pulses: 2+ and symmetric Skin: Skin color, texture, turgor normal. No rashes or lesions Neurologic: Grossly normal  EKG sinus rhythm with PACs at 92 with nonspecific ST and T wave changes.  I personally reviewed this EKG.  ASSESSMENT AND PLAN:   CAD S/P percutaneous coronary angioplasty History of CAD status post right coronary artery ostial stenting by myself via the femoral approach//19 with an AR-1 with sidehole guide catheter because of anterior takeoff.  He had otherwise only mild segmental proximal LAD disease.  I stented him with a 2.5  mm x 15 mm long resolute Onyx drug-eluting stent postdilated with a 2.75 mm x 12 mm long noncompliant balloon.  Over the last 3 to 4 weeks he is noticed recurrent exertional chest pain similar to his Christelle Igoe stent symptoms.  He has taken sublingual nitroglycerin on occasion.  Based on this I feel compelled to restudy him this coming Thursday.     Lorretta Harp MD FACP,FACC,FAHA, [facility] 10/24/2021 2:34 PM

## 2021-10-24 NOTE — Progress Notes (Signed)
10/24/2021 Joshua Wyatt   [birth year]  [id number]  Primary Physician Jani Gravel, MD Primary Cardiologist: Lorretta Harp MD FACP, [place], [place], [place]  HPI:  Joshua Wyatt is a 75 y.o.  thin appearing widowed Caucasian male with no children who I last saw in the office 08/08/2021.  Unfortunately, his wife of 24 years died at the age of 45 on 10/22/2020.  He is currently living alone.  He has a history of PVOD status post bilateral carotid endarterectomies performed by Dr. Drucie Opitz in 2007. We have been following him by duplex ultrasound which performed most recently several days ago revealed this to be widely patent. He has also had bilateral SFA intervention as well as right renal artery stenting. His other problems include hypertension and hyperlipidemia. He did have an episode of chest heaviness recently, but more importantly he has complained of progressive lifestyle limiting claudication since I last saw him. Most recent lower extremity Dopplers reveal a right ABI of 0.93 and a left of 0.82. He does appear to have moderate iliac disease as well as high grade right SFA and popliteal stenosis. He underwent abdominal aortography with bifemoral runoff on 03/30/13 revealed a widely patent right renal artery stent, 50-60% proximal left renal artery stenosis. The left SFA was while he stayed patent and he had a 95% focal above-the-knee popliteal artery stenosis with three-vessel runoff. There was a 60% focal lesion in the right common iliac artery and a 50% segmental proximal to mid right SFA stenosis with a patent stent in the mid to distal right SFA. He also had a 50% right popliteal stenosis with three-vessel runoff. He underwent cutting balloon angioplasty of the left above-the-knee popliteal artery and Angiosculpt balloon with an excellent angiographic result and was discharged the following day. I performed angiography on him 10/11/14 and recanalized a subtotally occluded right SFA with overlapping  via Bahn covered stent performed intervention on his above-the-knee popliteal artery on that side. He had a patent mid to distal left SFA stent with high grade segmental and 6 sequential disease in the proximal SFA and distal SFA on the left. Since I saw him a year and a half ago he's had progressive lifestyle limiting claudication in his left calf. Addition, he's had severe progression of disease in his right internal carotid artery. I performed carotid stenting on him 01/03/17 with an excellent result. He underwent left lower extremity angiography by myself 02/03/17 revealing an occluded distal left SFA stent with otherwise high-grade segmental disease proximal to this and occluded anterior tibial. I performed cutting balloon atherectomy of his in-stent restenosis and the surrounding disease excellent angiographic result. His claudication has completely resolved. His left ABI has improved from 0.59 up to 1.   Because of recurrent claudication I re-angiogram him 05/11/2018 revealing a patent mid left SFA stent with high-grade segmental proximal left SFA stenosis which I performed Hawk 1 directional atherectomy followed by drug-eluting balloon angioplasty.  Claudication resolved and his Dopplers have normalized.  He did have RCA intervention by myself 03/13/2018 with a drug-eluting stent.  He currently denies chest pain, shortness of breath or claudication.  He had elective right left total knee replacement by Dr. Gladstone Lighter in June of this year which was uncomplicated.   Since I saw him in the office 2-1/2 months ago he was doing well until 2 to 3 weeks ago when he started noticing exertional substernal chest burning similar to his [name] stent symptoms.  He has taken several neck  nitroglycerin with some relief.   Current Meds  Medication Sig   amLODipine (NORVASC) 5 MG tablet Take 1 tablet (5 mg total) by mouth daily.   ezetimibe (ZETIA) 10 MG tablet Take 1 tablet by mouth once daily   methocarbamol (ROBAXIN) 500  MG tablet Take 1 tablet (500 mg total) by mouth every 6 (six) hours as needed for muscle spasms.   nitroGLYCERIN (NITROSTAT) 0.4 MG SL tablet Place 1 tablet (0.4 mg total) under the tongue every 5 (five) minutes as needed for chest pain.   pantoprazole (PROTONIX) 40 MG tablet Take 1 tablet by mouth once daily   tamsulosin (FLOMAX) 0.4 MG CAPS capsule Take 0.4 mg by mouth every other day. AT HS     Allergies  Allergen Reactions   Statins Swelling and Other (See Comments)    Muscle pain, also    Social History   Socioeconomic History   Marital status: Married    Spouse name: Not on file   Number of children: Not on file   Years of education: Not on file   Highest education level: Not on file  Occupational History   Not on file  Tobacco Use   Smoking status: Former    Packs/day: 1.00    Years: 58.00    Pack years: 58.00    Types: Cigars, Cigarettes    Quit date: 10/18/2013    Years since quitting: 8.0   Smokeless tobacco: Never  Vaping Use   Vaping Use: Never used  Substance and Sexual Activity   Alcohol use: Not Currently   Drug use: Never   Sexual activity: Yes  Other Topics Concern   Not on file  Social History Narrative   Not on file   Social Determinants of Health   Financial Resource Strain: Not on file  Food Insecurity: Not on file  Transportation Needs: Not on file  Physical Activity: Not on file  Stress: Not on file  Social Connections: Not on file  Intimate Partner Violence: Not on file     Review of Systems: General: negative for chills, fever, night sweats or weight changes.  Cardiovascular: negative for chest pain, dyspnea on exertion, edema, orthopnea, palpitations, paroxysmal nocturnal dyspnea or shortness of breath Dermatological: negative for rash Respiratory: negative for cough or wheezing Urologic: negative for hematuria Abdominal: negative for nausea, vomiting, diarrhea, bright red blood per rectum, melena, or hematemesis Neurologic:  negative for visual changes, syncope, or dizziness All other systems reviewed and are otherwise negative except as noted above.    Blood pressure (!) 142/66, pulse 92, height 5' 7.5" (1.715 m), weight 173 lb (78.5 kg), SpO2 96 %.  General appearance: alert and no distress Neck: no adenopathy, no JVD, supple, symmetrical, trachea midline, thyroid not enlarged, symmetric, no tenderness/mass/nodules, and soft bilateral carotid bruits Lungs: clear to auscultation bilaterally Heart: regular rate and rhythm, S1, S2 normal, no murmur, click, rub or gallop Extremities: extremities normal, atraumatic, no cyanosis or edema Pulses: 2+ and symmetric Skin: Skin color, texture, turgor normal. No rashes or lesions Neurologic: Grossly normal  EKG sinus rhythm with PACs at 92 with nonspecific ST and T wave changes.  I personally reviewed this EKG.  ASSESSMENT AND PLAN:   CAD S/P percutaneous coronary angioplasty History of CAD status post right coronary artery ostial stenting by myself via the femoral approach//19 with an AR-1 with sidehole guide catheter because of anterior takeoff.  He had otherwise only mild segmental proximal LAD disease.  I stented him with a 2.5  mm x 15 mm long resolute Onyx drug-eluting stent postdilated with a 2.75 mm x 12 mm long noncompliant balloon.  Over the last 3 to 4 weeks he is noticed recurrent exertional chest pain similar to his Leshaun Biebel stent symptoms.  He has taken sublingual nitroglycerin on occasion.  Based on this I feel compelled to restudy him this coming Thursday.     Lorretta Harp MD FACP,FACC,FAHA, [facility] 10/24/2021 2:34 PM

## 2021-10-24 NOTE — Assessment & Plan Note (Signed)
History of CAD status post right coronary artery ostial stenting by myself via the femoral approach//19 with an AR-1 with sidehole guide catheter because of anterior takeoff.  He had otherwise only mild segmental proximal LAD disease.  I stented him with a 2.5 mm x 15 mm long resolute Onyx drug-eluting stent postdilated with a 2.75 mm x 12 mm long noncompliant balloon.  Over the last 3 to 4 weeks he is noticed recurrent exertional chest pain similar to his Lashuna Tamashiro stent symptoms.  He has taken sublingual nitroglycerin on occasion.  Based on this I feel compelled to restudy him this coming Thursday.

## 2021-10-24 NOTE — Progress Notes (Signed)
Orders for heart cath placed.

## 2021-10-24 NOTE — Telephone Encounter (Signed)
   Pt c/o of Chest Pain: STAT if CP now or developed within 24 hours  1. Are you having CP right now? No   2. Are you experiencing any other symptoms (ex. SOB, nausea, vomiting, sweating)? SOB, Dizzy, nauseous, sweating   3. How long have you been experiencing CP? 3 weeks   4. Is your CP continuous or coming and going? coming and going  5. Have you taken Nitroglycerin? Yes   Pt said he's been feeling chest pressure every time he plays gold and he plays once a week, he said every time he does take nitroglycerin, yesterday he had to take two to relieved his chest pressure

## 2021-10-25 ENCOUNTER — Telehealth: Payer: Self-pay | Admitting: *Deleted

## 2021-10-25 LAB — BASIC METABOLIC PANEL
BUN/Creatinine Ratio: 10 (ref 10–24)
BUN: 13 mg/dL (ref 8–27)
CO2: 20 mmol/L (ref 20–29)
Calcium: 9.7 mg/dL (ref 8.6–10.2)
Chloride: 101 mmol/L (ref 96–106)
Creatinine, Ser: 1.35 mg/dL — ABNORMAL HIGH (ref 0.76–1.27)
Glucose: 144 mg/dL — ABNORMAL HIGH (ref 70–99)
Potassium: 3.7 mmol/L (ref 3.5–5.2)
Sodium: 138 mmol/L (ref 134–144)
eGFR: 54 mL/min/{1.73_m2} — ABNORMAL LOW (ref >59)

## 2021-10-25 LAB — CBC
Hematocrit: 33.5 % — ABNORMAL LOW (ref 37.5–51.0)
Hemoglobin: 10.7 g/dL — ABNORMAL LOW (ref 13.0–17.7)
MCH: 28.1 pg (ref 26.6–33.0)
MCHC: 31.9 g/dL (ref 31.5–35.7)
MCV: 88 fL (ref 79–97)
Platelets: 281 10*3/uL (ref 150–450)
RBC: 3.81 x10E6/uL — ABNORMAL LOW (ref 4.14–5.80)
RDW: 12.3 % (ref 11.6–15.4)
WBC: 7.2 10*3/uL (ref 3.4–10.8)

## 2021-10-25 NOTE — Telephone Encounter (Signed)
Cardiac catheterization scheduled at [facility] for: Thursday October 26, 2021 7:30 [facility] Main Entrance A [facility]) at: 5:30 AM   No solid food after midnight prior to cath, clear liquids until 5 AM day of procedure.   Usual morning medications can be taken pre-cath with sips of water including aspirin 81 mg.    Confirmed patient has responsible adult to drive home post procedure and be with patient first 24 hours after arriving home.  [facility] does allow one visitor to accompany you and wait in the hospital waiting room while you are there for your procedure. You and your visitor will be asked to wear a mask once you enter the hospital.   Patient reports does not currently have any new symptoms concerning for COVID-19 and no household members with COVID-19 like illness.     Reviewed procedure/mask/visitor instructions with patient.

## 2021-10-26 ENCOUNTER — Observation Stay (HOSPITAL_COMMUNITY): Payer: Medicare Other

## 2021-10-26 ENCOUNTER — Inpatient Hospital Stay (HOSPITAL_COMMUNITY)
Admission: RE | Disposition: A | Discharge status: Home / Self Care | Payer: Self-pay | Source: Home / Self Care | Attending: Thoracic Surgery (Cardiothoracic Vascular Surgery)

## 2021-10-26 ENCOUNTER — Other Ambulatory Visit: Payer: Self-pay

## 2021-10-26 ENCOUNTER — Inpatient Hospital Stay (HOSPITAL_COMMUNITY)
Admission: RE | Admit: 2021-10-26 | Discharge: 2021-11-03 | DRG: 234 | Disposition: A | Discharge status: Home / Self Care | Payer: Medicare Other | Attending: Thoracic Surgery (Cardiothoracic Vascular Surgery) | Admitting: Thoracic Surgery (Cardiothoracic Vascular Surgery)

## 2021-10-26 ENCOUNTER — Observation Stay (HOSPITAL_BASED_OUTPATIENT_CLINIC_OR_DEPARTMENT_OTHER): Payer: Medicare Other

## 2021-10-26 DIAGNOSIS — I25110 Atherosclerotic heart disease of native coronary artery with unstable angina pectoris: Secondary | ICD-10-CM

## 2021-10-26 DIAGNOSIS — I2584 Coronary atherosclerosis due to calcified coronary lesion: Secondary | ICD-10-CM | POA: Diagnosis present

## 2021-10-26 DIAGNOSIS — D62 Acute posthemorrhagic anemia: Secondary | ICD-10-CM | POA: Diagnosis not present

## 2021-10-26 DIAGNOSIS — Z951 Presence of aortocoronary bypass graft: Secondary | ICD-10-CM

## 2021-10-26 DIAGNOSIS — Z96652 Presence of left artificial knee joint: Secondary | ICD-10-CM

## 2021-10-26 DIAGNOSIS — N1831 Chronic kidney disease, stage 3a: Secondary | ICD-10-CM | POA: Diagnosis not present

## 2021-10-26 DIAGNOSIS — N183 Chronic kidney disease, stage 3 unspecified: Secondary | ICD-10-CM | POA: Diagnosis not present

## 2021-10-26 DIAGNOSIS — I6523 Occlusion and stenosis of bilateral carotid arteries: Secondary | ICD-10-CM | POA: Diagnosis not present

## 2021-10-26 DIAGNOSIS — K219 Gastro-esophageal reflux disease without esophagitis: Secondary | ICD-10-CM | POA: Diagnosis not present

## 2021-10-26 DIAGNOSIS — Z9889 Other specified postprocedural states: Secondary | ICD-10-CM | POA: Diagnosis not present

## 2021-10-26 DIAGNOSIS — Z96651 Presence of right artificial knee joint: Secondary | ICD-10-CM

## 2021-10-26 DIAGNOSIS — D696 Thrombocytopenia, unspecified: Secondary | ICD-10-CM | POA: Diagnosis not present

## 2021-10-26 DIAGNOSIS — I480 Paroxysmal atrial fibrillation: Secondary | ICD-10-CM | POA: Diagnosis present

## 2021-10-26 DIAGNOSIS — I2510 Atherosclerotic heart disease of native coronary artery without angina pectoris: Secondary | ICD-10-CM | POA: Diagnosis not present

## 2021-10-26 DIAGNOSIS — J9811 Atelectasis: Secondary | ICD-10-CM | POA: Diagnosis not present

## 2021-10-26 DIAGNOSIS — I5022 Chronic systolic (congestive) heart failure: Secondary | ICD-10-CM | POA: Diagnosis present

## 2021-10-26 DIAGNOSIS — I70203 Unspecified atherosclerosis of native arteries of extremities, bilateral legs: Secondary | ICD-10-CM | POA: Diagnosis not present

## 2021-10-26 DIAGNOSIS — Z87891 Personal history of nicotine dependence: Secondary | ICD-10-CM | POA: Diagnosis not present

## 2021-10-26 DIAGNOSIS — J90 Pleural effusion, not elsewhere classified: Secondary | ICD-10-CM | POA: Diagnosis not present

## 2021-10-26 DIAGNOSIS — I129 Hypertensive chronic kidney disease with stage 1 through stage 4 chronic kidney disease, or unspecified chronic kidney disease: Secondary | ICD-10-CM | POA: Diagnosis not present

## 2021-10-26 DIAGNOSIS — Z7982 Long term (current) use of aspirin: Secondary | ICD-10-CM

## 2021-10-26 DIAGNOSIS — Z79899 Other long term (current) drug therapy: Secondary | ICD-10-CM

## 2021-10-26 DIAGNOSIS — R079 Chest pain, unspecified: Secondary | ICD-10-CM | POA: Diagnosis present

## 2021-10-26 DIAGNOSIS — I340 Nonrheumatic mitral (valve) insufficiency: Secondary | ICD-10-CM | POA: Diagnosis not present

## 2021-10-26 DIAGNOSIS — I130 Hypertensive heart and chronic kidney disease with heart failure and stage 1 through stage 4 chronic kidney disease, or unspecified chronic kidney disease: Secondary | ICD-10-CM | POA: Diagnosis present

## 2021-10-26 DIAGNOSIS — N179 Acute kidney failure, unspecified: Secondary | ICD-10-CM | POA: Diagnosis present

## 2021-10-26 DIAGNOSIS — Z9861 Coronary angioplasty status: Secondary | ICD-10-CM

## 2021-10-26 DIAGNOSIS — I517 Cardiomegaly: Secondary | ICD-10-CM | POA: Diagnosis not present

## 2021-10-26 DIAGNOSIS — Z20822 Contact with and (suspected) exposure to covid-19: Secondary | ICD-10-CM | POA: Diagnosis present

## 2021-10-26 DIAGNOSIS — I739 Peripheral vascular disease, unspecified: Secondary | ICD-10-CM

## 2021-10-26 DIAGNOSIS — I25118 Atherosclerotic heart disease of native coronary artery with other forms of angina pectoris: Principal | ICD-10-CM | POA: Diagnosis present

## 2021-10-26 DIAGNOSIS — I472 Ventricular tachycardia, unspecified: Secondary | ICD-10-CM | POA: Diagnosis not present

## 2021-10-26 DIAGNOSIS — Z8249 Family history of ischemic heart disease and other diseases of the circulatory system: Secondary | ICD-10-CM

## 2021-10-26 DIAGNOSIS — E8770 Fluid overload, unspecified: Secondary | ICD-10-CM | POA: Diagnosis not present

## 2021-10-26 DIAGNOSIS — Z01818 Encounter for other preprocedural examination: Secondary | ICD-10-CM | POA: Diagnosis not present

## 2021-10-26 DIAGNOSIS — M19042 Primary osteoarthritis, left hand: Secondary | ICD-10-CM | POA: Diagnosis present

## 2021-10-26 DIAGNOSIS — R001 Bradycardia, unspecified: Secondary | ICD-10-CM | POA: Diagnosis not present

## 2021-10-26 DIAGNOSIS — Z96653 Presence of artificial knee joint, bilateral: Secondary | ICD-10-CM | POA: Diagnosis not present

## 2021-10-26 DIAGNOSIS — Z955 Presence of coronary angioplasty implant and graft: Secondary | ICD-10-CM

## 2021-10-26 DIAGNOSIS — I083 Combined rheumatic disorders of mitral, aortic and tricuspid valves: Secondary | ICD-10-CM | POA: Diagnosis not present

## 2021-10-26 DIAGNOSIS — I209 Angina pectoris, unspecified: Secondary | ICD-10-CM | POA: Diagnosis not present

## 2021-10-26 DIAGNOSIS — Z888 Allergy status to other drugs, medicaments and biological substances status: Secondary | ICD-10-CM | POA: Diagnosis not present

## 2021-10-26 DIAGNOSIS — I701 Atherosclerosis of renal artery: Secondary | ICD-10-CM | POA: Diagnosis present

## 2021-10-26 DIAGNOSIS — I10 Essential (primary) hypertension: Secondary | ICD-10-CM | POA: Diagnosis not present

## 2021-10-26 DIAGNOSIS — E785 Hyperlipidemia, unspecified: Secondary | ICD-10-CM | POA: Diagnosis not present

## 2021-10-26 DIAGNOSIS — I441 Atrioventricular block, second degree: Secondary | ICD-10-CM | POA: Diagnosis present

## 2021-10-26 DIAGNOSIS — R7303 Prediabetes: Secondary | ICD-10-CM | POA: Diagnosis not present

## 2021-10-26 HISTORY — PX: LEFT HEART CATH AND CORONARY ANGIOGRAPHY: CATH118249

## 2021-10-26 LAB — PULMONARY FUNCTION TEST
FEF 25-75 Pre: 2.29 L/sec
FEF2575-%Pred-Pre: 117 %
FEV1-%Pred-Pre: 82 %
FEV1-Pre: 2.24 L
FEV1FVC-%Pred-Pre: 112 %
FEV6-%Pred-Pre: 77 %
FEV6-Pre: 2.72 L
FEV6FVC-%Pred-Pre: 107 %
FVC-%Pred-Pre: 72 %
FVC-Pre: 2.74 L
Pre FEV1/FVC ratio: 82 %
Pre FEV6/FVC Ratio: 100 %

## 2021-10-26 LAB — SURGICAL PCR SCREEN
MRSA, PCR: NEGATIVE
Staphylococcus aureus: POSITIVE — AB

## 2021-10-26 LAB — SARS CORONAVIRUS 2 BY RT PCR (HOSPITAL ORDER, PERFORMED IN ~~LOC~~ HOSPITAL LAB): SARS Coronavirus 2: NEGATIVE

## 2021-10-26 SURGERY — LEFT HEART CATH AND CORONARY ANGIOGRAPHY
Anesthesia: LOCAL

## 2021-10-26 MED ORDER — ONDANSETRON HCL 4 MG/2ML IJ SOLN: 4.0000 mg | Freq: Four times a day (QID) | INTRAMUSCULAR | Status: DC | PRN

## 2021-10-26 MED ORDER — POTASSIUM CHLORIDE 2 MEQ/ML IV SOLN
80.0000 meq | INTRAVENOUS | Status: DC
  Filled 2021-10-26: qty 40

## 2021-10-26 MED ORDER — SODIUM CHLORIDE 0.9 % WEIGHT BASED INFUSION
3.0000 mL/kg/h | INTRAVENOUS | Status: DC
  Administered 2021-10-26: 06:00:00 3 mL/kg/h via INTRAVENOUS

## 2021-10-26 MED ORDER — ASPIRIN 81 MG PO CHEW: 81.0000 mg | CHEWABLE_TABLET | ORAL | Status: DC

## 2021-10-26 MED ORDER — HEPARIN (PORCINE) IN NACL 1000-0.9 UT/500ML-% IV SOLN
INTRAVENOUS | Status: DC | PRN
  Administered 2021-10-26 (×2): 500 mL

## 2021-10-26 MED ORDER — DEXMEDETOMIDINE HCL IN NACL 400 MCG/100ML IV SOLN
0.1000 ug/kg/h | INTRAVENOUS | Status: DC
  Filled 2021-10-26: qty 100

## 2021-10-26 MED ORDER — TAMSULOSIN HCL 0.4 MG PO CAPS
0.4000 mg | ORAL_CAPSULE | Freq: Every day | ORAL | Status: DC
  Administered 2021-10-26 – 2021-11-02 (×7): 0.4 mg via ORAL
  Filled 2021-10-26 (×7): qty 1

## 2021-10-26 MED ORDER — CEFAZOLIN SODIUM-DEXTROSE 2-4 GM/100ML-% IV SOLN
2.0000 g | INTRAVENOUS | Status: AC
  Administered 2021-10-27: 2 g via INTRAVENOUS
  Filled 2021-10-26: qty 100

## 2021-10-26 MED ORDER — CHLORHEXIDINE GLUCONATE CLOTH 2 % EX PADS
6.0000 | MEDICATED_PAD | Freq: Once | CUTANEOUS | Status: AC
  Administered 2021-10-26: 21:00:00 6 via TOPICAL

## 2021-10-26 MED ORDER — HEPARIN (PORCINE) IN NACL 1000-0.9 UT/500ML-% IV SOLN
INTRAVENOUS | Status: AC
  Filled 2021-10-26: qty 500

## 2021-10-26 MED ORDER — SODIUM CHLORIDE 0.9% FLUSH: 3.0000 mL | INTRAVENOUS | Status: DC | PRN

## 2021-10-26 MED ORDER — PANTOPRAZOLE SODIUM 40 MG PO TBEC
40.0000 mg | DELAYED_RELEASE_TABLET | Freq: Every day | ORAL | Status: DC
  Filled 2021-10-26: qty 1

## 2021-10-26 MED ORDER — MAGNESIUM SULFATE 50 % IJ SOLN
40.0000 meq | INTRAMUSCULAR | Status: DC
  Filled 2021-10-26: qty 9.85

## 2021-10-26 MED ORDER — INSULIN REGULAR(HUMAN) IN NACL 100-0.9 UT/100ML-% IV SOLN
INTRAVENOUS | Status: DC
  Filled 2021-10-26: qty 100

## 2021-10-26 MED ORDER — PHENYLEPHRINE HCL-NACL 20-0.9 MG/250ML-% IV SOLN
30.0000 ug/min | INTRAVENOUS | Status: AC
  Administered 2021-10-27: 20 ug/min via INTRAVENOUS
  Filled 2021-10-26: qty 250

## 2021-10-26 MED ORDER — CHLORHEXIDINE GLUCONATE CLOTH 2 % EX PADS
6.0000 | MEDICATED_PAD | Freq: Once | CUTANEOUS | Status: AC
  Administered 2021-10-27: 6 via TOPICAL

## 2021-10-26 MED ORDER — NITROGLYCERIN IN D5W 200-5 MCG/ML-% IV SOLN
2.0000 ug/min | INTRAVENOUS | Status: DC
  Filled 2021-10-26: qty 250

## 2021-10-26 MED ORDER — MIDAZOLAM HCL 2 MG/2ML IJ SOLN
INTRAMUSCULAR | Status: AC
  Filled 2021-10-26: qty 2

## 2021-10-26 MED ORDER — FENTANYL CITRATE (PF) 100 MCG/2ML IJ SOLN
INTRAMUSCULAR | Status: DC | PRN
  Administered 2021-10-26: 25 ug via INTRAVENOUS

## 2021-10-26 MED ORDER — HYDRALAZINE HCL 20 MG/ML IJ SOLN
10.0000 mg | INTRAMUSCULAR | Status: AC | PRN
  Administered 2021-10-26: 08:00:00 10 mg via INTRAVENOUS

## 2021-10-26 MED ORDER — SODIUM CHLORIDE 0.9 % WEIGHT BASED INFUSION: 1.0000 mL/kg/h | INTRAVENOUS | Status: DC

## 2021-10-26 MED ORDER — ACETAMINOPHEN 325 MG PO TABS: 650.0000 mg | ORAL_TABLET | ORAL | Status: DC | PRN

## 2021-10-26 MED ORDER — AMLODIPINE BESYLATE 5 MG PO TABS
5.0000 mg | ORAL_TABLET | Freq: Every day | ORAL | Status: DC
  Filled 2021-10-26: qty 1

## 2021-10-26 MED ORDER — NOREPINEPHRINE 4 MG/250ML-% IV SOLN
0.0000 ug/min | INTRAVENOUS | Status: DC
  Filled 2021-10-26: qty 250

## 2021-10-26 MED ORDER — SODIUM CHLORIDE 0.9 % IV SOLN: 250.0000 mL | INTRAVENOUS | Status: DC | PRN

## 2021-10-26 MED ORDER — HEPARIN 30,000 UNITS/1000 ML (OHS) CELLSAVER SOLUTION
Status: DC
  Filled 2021-10-26: qty 1000

## 2021-10-26 MED ORDER — TRANEXAMIC ACID (OHS) PUMP PRIME SOLUTION
2.0000 mg/kg | INTRAVENOUS | Status: DC
  Filled 2021-10-26: qty 1.57

## 2021-10-26 MED ORDER — MILRINONE LACTATE IN DEXTROSE 20-5 MG/100ML-% IV SOLN
0.3000 ug/kg/min | INTRAVENOUS | Status: DC
  Filled 2021-10-26: qty 100

## 2021-10-26 MED ORDER — EZETIMIBE 10 MG PO TABS
10.0000 mg | ORAL_TABLET | Freq: Every day | ORAL | Status: DC
  Administered 2021-10-28 – 2021-11-03 (×7): 10 mg via ORAL
  Filled 2021-10-26 (×7): qty 1

## 2021-10-26 MED ORDER — TRANEXAMIC ACID 1000 MG/10ML IV SOLN
1.5000 mg/kg/h | INTRAVENOUS | Status: AC
  Administered 2021-10-27: 1.5 mg/kg/h via INTRAVENOUS
  Filled 2021-10-26: qty 25

## 2021-10-26 MED ORDER — METOPROLOL TARTRATE 12.5 MG HALF TABLET
12.5000 mg | ORAL_TABLET | Freq: Once | ORAL | Status: AC
  Administered 2021-10-27: 12.5 mg via ORAL
  Filled 2021-10-26: qty 1

## 2021-10-26 MED ORDER — LIDOCAINE HCL (PF) 1 % IJ SOLN
INTRAMUSCULAR | Status: DC | PRN
  Administered 2021-10-26: 30 mL

## 2021-10-26 MED ORDER — LABETALOL HCL 5 MG/ML IV SOLN: 10.0000 mg | INTRAVENOUS | Status: AC | PRN

## 2021-10-26 MED ORDER — SODIUM CHLORIDE 0.9% FLUSH
3.0000 mL | Freq: Two times a day (BID) | INTRAVENOUS | Status: DC
  Administered 2021-10-26: 21:00:00 3 mL via INTRAVENOUS

## 2021-10-26 MED ORDER — MIDAZOLAM HCL 2 MG/2ML IJ SOLN
INTRAMUSCULAR | Status: DC | PRN
  Administered 2021-10-26: 1 mg via INTRAVENOUS

## 2021-10-26 MED ORDER — TRANEXAMIC ACID (OHS) BOLUS VIA INFUSION
15.0000 mg/kg | INTRAVENOUS | Status: AC
  Administered 2021-10-27: 1177.5 mg via INTRAVENOUS
  Filled 2021-10-26: qty 1178

## 2021-10-26 MED ORDER — IODIXANOL 320 MG/ML IV SOLN
INTRAVENOUS | Status: DC | PRN
  Administered 2021-10-26: 08:00:00 60 mL via INTRA_ARTERIAL

## 2021-10-26 MED ORDER — EPINEPHRINE HCL 5 MG/250ML IV SOLN IN NS
0.0000 ug/min | INTRAVENOUS | Status: DC
  Filled 2021-10-26: qty 250

## 2021-10-26 MED ORDER — ASPIRIN 81 MG PO CHEW: 81.0000 mg | CHEWABLE_TABLET | Freq: Every day | ORAL | Status: DC

## 2021-10-26 MED ORDER — ASPIRIN EC 81 MG PO TBEC: 162.0000 mg | DELAYED_RELEASE_TABLET | Freq: Every morning | ORAL | Status: DC

## 2021-10-26 MED ORDER — BISACODYL 5 MG PO TBEC
5.0000 mg | DELAYED_RELEASE_TABLET | Freq: Once | ORAL | Status: AC
  Administered 2021-10-26: 21:00:00 5 mg via ORAL
  Filled 2021-10-26: qty 1

## 2021-10-26 MED ORDER — FENTANYL CITRATE (PF) 100 MCG/2ML IJ SOLN
INTRAMUSCULAR | Status: AC
  Filled 2021-10-26: qty 2

## 2021-10-26 MED ORDER — VANCOMYCIN HCL 1250 MG/250ML IV SOLN
1250.0000 mg | INTRAVENOUS | Status: AC
  Administered 2021-10-27: 1250 mg via INTRAVENOUS
  Filled 2021-10-26: qty 250

## 2021-10-26 MED ORDER — CHLORHEXIDINE GLUCONATE 0.12 % MT SOLN
15.0000 mL | Freq: Once | OROMUCOSAL | Status: AC
  Administered 2021-10-27: 15 mL via OROMUCOSAL
  Filled 2021-10-26: qty 15

## 2021-10-26 MED ORDER — IRBESARTAN 150 MG PO TABS
150.0000 mg | ORAL_TABLET | Freq: Every day | ORAL | Status: DC
  Administered 2021-10-26: 12:00:00 150 mg via ORAL
  Filled 2021-10-26 (×2): qty 1

## 2021-10-26 MED ORDER — SODIUM CHLORIDE 0.9 % IV SOLN: INTRAVENOUS | Status: AC

## 2021-10-26 MED ORDER — PLASMA-LYTE A IV SOLN
INTRAVENOUS | Status: DC
  Filled 2021-10-26: qty 5

## 2021-10-26 MED ORDER — LIDOCAINE HCL (PF) 1 % IJ SOLN
INTRAMUSCULAR | Status: AC
  Filled 2021-10-26: qty 30

## 2021-10-26 MED ORDER — MORPHINE SULFATE (PF) 2 MG/ML IV SOLN: 2.0000 mg | INTRAVENOUS | Status: DC | PRN

## 2021-10-26 MED ORDER — HYDRALAZINE HCL 20 MG/ML IJ SOLN
INTRAMUSCULAR | Status: AC
  Filled 2021-10-26: qty 1

## 2021-10-26 MED ORDER — TEMAZEPAM 15 MG PO CAPS: 15.0000 mg | ORAL_CAPSULE | Freq: Once | ORAL | Status: DC | PRN

## 2021-10-26 MED ORDER — DIAZEPAM 5 MG PO TABS
5.0000 mg | ORAL_TABLET | Freq: Once | ORAL | Status: AC
  Administered 2021-10-27: 5 mg via ORAL
  Filled 2021-10-26: qty 1

## 2021-10-26 SURGICAL SUPPLY — 9 items
CATH INFINITI 5FR MULTPACK ANG (CATHETERS) ×1 IMPLANT
KIT HEART LEFT (KITS) ×2 IMPLANT
PACK CARDIAC CATHETERIZATION (CUSTOM PROCEDURE TRAY) ×2 IMPLANT
SHEATH PINNACLE 5F 10CM (SHEATH) ×1 IMPLANT
SHEATH PROBE COVER 6X72 (BAG) ×1 IMPLANT
SYR MEDRAD MARK 7 150ML (SYRINGE) ×2 IMPLANT
TRANSDUCER W/STOPCOCK (MISCELLANEOUS) ×2 IMPLANT
TUBING CIL FLEX 10 FLL-RA (TUBING) ×2 IMPLANT
WIRE EMERALD 3MM-J .035X150CM (WIRE) ×1 IMPLANT

## 2021-10-26 NOTE — Consult Note (Addendum)
[address]       [name],Joshua Wyatt [address]             [phone]        [name] [place] Medical Record #[id number] Date of Birth: 1946-08-29  Referring: Lorretta Harp, MD  Primary Care: Janie Morning, DO Primary Cardiologist: Lorretta Harp, MD   Chief Complaint:   Chest pain.   History of Present Illness:     [name]. Joshua Wyatt is a very pleasant 75 year old gentleman with past medical history with known coronary artery disease status post PTCI with stenting of the ostial right coronary artery by Dr. Gwenlyn Found in 2019.  He also has a history of stage III chronic kidney disease, dyslipidemia, hypertension, cerebrovascular disease status post bilateral carotid endarterectomies and subsequent stenting of the right coronary artery by Dr. Gwenlyn Found.  He has had multiple percutaneous procedures to the lower extremities as well.  He has degenerative joint disease and is status post bilateral knee replacements, the most recent being in his left knee about 6 weeks ago.  He is a former 58-pack-year smoker having quit 8 years ago. Joshua Wyatt reports he was fairly active lifestyle after retiring from [employer] trades several years ago.  He plays golf 2-3 times a week.  He noticed in the last month that we have exertional chest pain while on the golf course.  This would subside with rest and sublingual nitroglycerin.  Most recent episode presenting from completing again.  He contacted his cardiologist to discuss the symptoms and arrangements were made for evaluation with left heart catheterization.  Study was performed earlier this morning by Dr. Gwenlyn Found and demonstrates severe left main coronary artery stenosis and two-vessel coronary artery disease.  The previously placed right coronary artery stent was patent but he appeared to have an 80% ostial RCA stenosis.  There was a 90% distal left main coronary stenosis followed by 40 to 50% since then stenoses in the LAD.  The  circumflex coronary system did not have any significant obstructive disease.  A left ventriculogram was not performed.  He had an echo in September of this year demonstrating left ventricular ejection fraction of 50 to 55%.  There was mild mitral insufficiency but no stenosis.  There was mild aortic stenosis but no aortic insufficiency.  There was no abnormalities of the proximal aorta. Dr. Gwenlyn Found has asked CT surgery to evaluate Joshua Wyatt for consideration of coronary bypass grafting.  He is resting in bed Cath Lab.  He denies any chest pain or shortness of breath presently.     Current Activity/ Functional Status:   Zubrod Score: At the time of surgery this patient's most appropriate activity status/level should be described as: $RemoveBefor'[]'YMlfalCsqjih$     0    Normal activity, no symptoms $RemoveBef'[x]'ZormSNjQmD$     1    Restricted in physical strenuous activity but ambulatory, able to do out light work $RemoveBe'[]'eyKSPnqfT$     2    Ambulatory and capable of self care, unable to do work activities, up and about                 more than 50%  Of the time                            '[]'$     3    Only limited self care, in bed greater than 50% of waking hours $RemoveBefo'[]'HxYpTvsomZZ$   4    Completely disabled, no self care, confined to bed or chair $Remove'[]'EAjXXWB$     5    Moribund  Past Medical History:  Diagnosis Date   Arthritis    "LITTLE IN MY LEFT HAND" (05/12/2018)   Bilateral calf pain 03/02/2013   lower ext.doppler-mild arterial insufficiency to lower ext. this is a disease in val   CAD (coronary artery disease)    a. cath 02/24/18 - 90% ost RCA; 40% ost & pro LAD   Cerebral atherosclerosis 03/02/2013   carotid duplex- normal study   Deviated septum    RIGHT   GERD (gastroesophageal reflux disease)    Heart murmur    History of echocardiogram 08/02/2005   normal study    History of kidney stones    Hyperlipemia    Hypertension 03/30/2013   PV Angiogram- rt. renal artery stent was widely open,50-60% lt. renal artery stenosis,lt. SFA stents patent with high grade above the  knee  poplital stenosis   Hyponatremia 02/03/2017   Normal cardiac stress test 03/25/2013   EF 60%, normal stress test ,this is a presurgical  test   Peripheral vascular disease ([place])    PVD (peripheral vascular disease) ([place])    A. 2009: S/P PTA/stent to D SFA. B. 01/2017: angiosculpt atherectomy/drug-eluting balloon angioplasty to L SFA.   Renal artery stenosis ([place]) 03/02/2013   renal artery doppler-this was an abnormal doppler    Past Surgical History:  Procedure Laterality Date   APPENDECTOMY  1960's   CAROTID ENDARTERECTOMY Bilateral 2000's   CORONARY ANGIOPLASTY WITH STENT PLACEMENT  03/13/2018   CORONARY STENT INTERVENTION N/A 03/13/2018   Procedure: CORONARY STENT INTERVENTION;  Surgeon: Lorretta Harp, MD;  Location: [facility];  Service: Cardiovascular;  Laterality: N/A;   CYSTOSCOPY W/ STONE MANIPULATION  "X 4 or 5"   LEFT HEART CATH AND CORONARY ANGIOGRAPHY N/A 02/24/2018   Procedure: LEFT HEART CATH AND CORONARY ANGIOGRAPHY;  Surgeon: Lorretta Harp, MD;  Location: [place] [facility];  Service: Cardiovascular;  Laterality: N/A;   LITHOTRIPSY  "several"   LOWER EXTREMITY ANGIOGRAM N/A 10/11/2014   Procedure: LOWER EXTREMITY ANGIOGRAM;  Surgeon: Lorretta Harp, MD;  Location: [facility] CATH [facility];  Service: Cardiovascular;  Laterality: N/A;   LOWER EXTREMITY ANGIOGRAPHY N/A 02/04/2017   Procedure: Lower Extremity Angiography;  Surgeon: Lorretta Harp, MD;  Location: [place] [facility];  Service: Cardiovascular;  Laterality: N/A;   LOWER EXTREMITY ANGIOGRAPHY N/A 05/12/2018   Procedure: LOWER EXTREMITY ANGIOGRAPHY;  Surgeon: Lorretta Harp, MD;  Location: [place] [facility];  Service: Cardiovascular;  Laterality: N/A;   PERCUTANEOUS STENT INTERVENTION  10/11/2014   Procedure: PERCUTANEOUS STENT INTERVENTION;  Surgeon: Lorretta Harp, MD;  Location: [facility] CATH LAB;  Service: Cardiovascular;;  right sfax2   PERIPHERAL VASCULAR ATHERECTOMY  05/12/2018   Procedure: PERIPHERAL  VASCULAR ATHERECTOMY;  Surgeon: Lorretta Harp, MD;  Location: [place] [facility];  Service: Cardiovascular;;  left SFA   PERIPHERAL VASCULAR CATHETERIZATION N/A 01/03/2017   Procedure: Carotid PTA/Stent Intervention / Right;  Surgeon: Lorretta Harp, MD;  Location: [place] [facility];  Service: Cardiovascular;  Laterality: N/A;   PERIPHERAL VASCULAR INTERVENTION Left 02/04/2017   Procedure: Peripheral Vascular Intervention;  Surgeon: Lorretta Harp, MD;  Location: [place] [facility];  Service: Cardiovascular;  Laterality: Left;  left SFA   POPLITEAL ARTERY ANGIOPLASTY Left 03/30/2013   TONSILLECTOMY  1960's   TOTAL KNEE ARTHROPLASTY Right 05/06/2019   Procedure: TOTAL  KNEE ARTHROPLASTY;  Surgeon: Latanya Maudlin, MD;  Location: [facility];  Service: Orthopedics;  Laterality: Right;  188min   TOTAL KNEE ARTHROPLASTY Left 08/29/2021   Procedure: TOTAL KNEE ARTHROPLASTY;  Surgeon: Paralee Cancel, MD;  Location: [facility];  Service: Orthopedics;  Laterality: Left;    Social History   Tobacco Use  Smoking Status Former   Packs/day: 1.00   Years: 58.00   Pack years: 58.00   Types: Cigars, Cigarettes   Quit date: 10/18/2013   Years since quitting: 8.0  Smokeless Tobacco Never    Social History   Substance and Sexual Activity  Alcohol Use Not Currently     Allergies  Allergen Reactions   Ezetimibe     leg pains   Statins Swelling and Other (See Comments)    Muscle pain, also    Current Facility-Administered Medications  Medication Dose Route Frequency Provider Last Rate Last Admin   0.9 %  sodium chloride infusion  250 mL Intravenous PRN Lorretta Harp, MD       0.9 %  sodium chloride infusion   Intravenous Continuous Lorretta Harp, MD 75 mL/hr at 10/26/21 0820 New Bag at 10/26/21 0820   0.9% sodium chloride infusion  1 mL/kg/hr Intravenous Continuous Lorretta Harp, MD 78.5 mL/hr at 10/26/21 0557 1 mL/kg/hr at 10/26/21 0557   [START ON 10/27/2021] amLODipine (NORVASC) tablet  5 mg  5 mg Oral Daily Lorretta Harp, MD       aspirin chewable tablet 81 mg  81 mg Oral Pre-Cath Lorretta Harp, MD       fentaNYL (SUBLIMAZE) injection    PRN Lorretta Harp, MD   25 mcg at 10/26/21 0736   Heparin (Porcine) in NaCl 1000-0.9 UT/500ML-% SOLN    PRN Lorretta Harp, MD   500 mL at 10/26/21 0725   hydrALAZINE (APRESOLINE) injection 10 mg  10 mg Intravenous Q20 Min PRN Lorretta Harp, MD   10 mg at 10/26/21 0827   iodixanol (VISIPAQUE) 320 MG/ML injection    PRN Lorretta Harp, MD   60 mL at 10/26/21 0803   irbesartan (AVAPRO) tablet 150 mg  150 mg Oral Daily Lorretta Harp, MD   150 mg at 10/26/21 1147   labetalol (NORMODYNE) injection 10 mg  10 mg Intravenous Q10 min PRN Lorretta Harp, MD       lidocaine (PF) (XYLOCAINE) 1 % injection    PRN Lorretta Harp, MD   30 mL at 10/26/21 0742   midazolam (VERSED) injection    PRN Lorretta Harp, MD   1 mg at 10/26/21 0735   morphine 2 MG/ML injection 2 mg  2 mg Intravenous Q2H PRN Lorretta Harp, MD       ondansetron [facility]) injection 4 mg  4 mg Intravenous Q6H PRN Lorretta Harp, MD       [START ON 10/27/2021] pantoprazole (PROTONIX) EC tablet 40 mg  40 mg Oral Daily Lorretta Harp, MD       sodium chloride flush (NS) 0.9 % injection 3 mL  3 mL Intravenous PRN Lorretta Harp, MD        Facility-Administered Medications Prior to Admission  Medication Dose Route Frequency Provider Last Rate Last Admin   sodium chloride flush (NS) 0.9 % injection 3 mL  3 mL Intravenous Q12H Lorretta Harp, MD       Medications Prior to Admission  Medication Sig Dispense Refill Last Dose   acetaminophen (  TYLENOL) 500 MG tablet Take 500-1,000 mg by mouth every 6 (six) hours as needed (for pain).   Past Week   amLODipine (NORVASC) 5 MG tablet Take 1 tablet (5 mg total) by mouth daily. 90 tablet 2 10/26/2021 at 0500   aspirin EC 81 MG tablet Take 162 mg by mouth in the morning. Swallow whole.   10/26/2021 at  0500   ezetimibe (ZETIA) 10 MG tablet Take 1 tablet by mouth once daily 90 tablet 3 10/26/2021 at 0500   nitroGLYCERIN (NITROSTAT) 0.4 MG SL tablet PLACE 1 TABLET UNDER THE TONGUE  EVERY  5  MINUTES  AS  NEEDED  FOR  CHEST  PAIN 25 tablet 0 Past Week   pantoprazole (PROTONIX) 40 MG tablet Take 1 tablet by mouth once daily 90 tablet 3 10/26/2021 at [place] 420 MG/3.5ML SOCT Inject 420 mg into the skin every 30 (thirty) days.   Past Month   tamsulosin (FLOMAX) 0.4 MG CAPS capsule Take 0.4 mg by mouth at bedtime.   10/25/2021   docusate sodium (COLACE) 100 MG capsule Take 1 capsule (100 mg total) by mouth 2 (two) times daily. (Patient not taking: Reported on 10/25/2021) 10 capsule 0 Not Taking   HYDROcodone-acetaminophen (NORCO/VICODIN) 5-325 MG tablet Take 1 tablet by mouth every 4 (four) hours as needed for severe pain (pain score 7-10). (Patient not taking: Reported on 10/25/2021) 42 tablet 0 Not Taking   irbesartan (AVAPRO) 150 MG tablet Take 1 tablet (150 mg total) by mouth daily. (Patient not taking: Reported on 10/25/2021) 90 tablet 3 Not Taking   magnesium hydroxide (MILK OF MAGNESIA) 400 MG/5ML suspension Take 15-30 mLs by mouth daily as needed for mild constipation.   More than a month   methocarbamol (ROBAXIN) 500 MG tablet Take 1 tablet (500 mg total) by mouth every 6 (six) hours as needed for muscle spasms. (Patient not taking: Reported on 10/25/2021) 40 tablet 0 Not Taking   polyethylene glycol (MIRALAX / GLYCOLAX) 17 g packet Take 17 g by mouth daily as needed for mild constipation. (Patient not taking: Reported on 10/25/2021) 14 each 0 Not Taking    Family History  Problem Relation Age of Onset   Heart disease Father        CABG   Healthy Sister    Healthy Brother      Review of Systems:     Cardiac Review of Systems: Y or  [    ]= no  Chest Pain [  x  ]  Resting SOB [   ] Exertional SOB  [  ]  Orthopnea [  ]   Pedal Edema [   ]    Palpitations [ x  ] Syncope  [  ]   Presyncope [   ]  General Review of Systems: [Y] = yes [  ]=no Constitional: recent weight change [  ]; anorexia [  ]; fatigue [  ]; nausea [  ]; night sweats [  ]; fever [  ]; or chills [  ]                                                               Dental: Full plate top denture, all natural teeth I the lower jaw, no recent  issues.  Eye : blurred vision [  ]; diplopia [   ]; vision changes [  ];  Amaurosis fugax[  ]; Resp: cough [  ];  wheezing[  ];  hemoptysis[  ]; shortness of breath[  ]; paroxysmal nocturnal dyspnea[  ]; dyspnea on exertion[  ]; or orthopnea[  ];  GI:  gallstones[  ], vomiting[  ];  dysphagia[  ]; melena[  ];  hematochezia [  ]; heartburn[  ];   Hx of  Colonoscopy[  ]; GU: kidney stones [  ]; hematuria[  ];   dysuria [  ];  nocturia[  ];  history of     obstruction [  ]; urinary frequency [  ]             Skin: rash, swelling[  ];, hair loss[  ];  peripheral edema[  ];  or itching[  ]; Musculosketetal: myalgias[  ];  joint swelling[ x, left knee, recent Cabbell left knee replacement.];  joint erythema[  ];  joint pain[  ];  back pain[  ];  Heme/Lymph: bruising[  ];  bleeding[  ];  anemia[  ];  Neuro: TIA[  ];  headaches[  ];  stroke[  ];  vertigo[  ];  seizures[  ];   paresthesias[  ];  difficulty walking[  ];  Psych:depression[  ]; anxiety[  ];  Endocrine: diabetes[  ];  thyroid dysfunction[  ];                  Physical Exam: BP (!) 163/74   Pulse 87   Temp 98 F (36.7 C)   Resp 15   Wt 78.5 kg   SpO2 96%   BMI 26.70 kg/m    General appearance: alert, cooperative, appears stated age, and no distress Head: Normocephalic, without obvious abnormality, atraumatic Neck: no adenopathy, no JVD, supple, symmetrical, trachea midline, and there are bilateral carotid bruits Lymph nodes: No cervical or clavicular adenopathy Resp: clear to auscultation bilaterally Cardio: Irregular rhythm, monitor shows sinus rhythm with frequent PACs and a few  PVCs.  There is a grade 3/6 systolic murmur throughout the precordium. GI: Soft, nontender, active bowel sounds. Extremities: No obvious deformity.  He is status post bilateral knee replacements minimal joint swelling.  All extremities are well perfused.  Both radial pulses are easily palpable as well bilateral posterior tibial pulses.  Capillary refill in the right foot is delayed but brisk on left. Neurologic: Grossly normal  Diagnostic Studies & Laboratory data:     Recent Radiology Findings:   CARDIAC CATHETERIZATION        History obtained from chart review.AARYA QUEBEDEAUX is a 75 y.o.  thin appearing widowed Caucasian male with no children who I last saw in the office 08/08/2021.  Unfortunately, his wife of 24 years died at the age of 58 on 10-12-20.  He is currently living alone.  He has a history of PVOD status post bilateral carotid endarterectomies performed by Dr. Drucie Opitz in 2007. We have been following him by duplex ultrasound which performed most recently several days ago revealed this to be widely patent. He has also had bilateral SFA intervention as well as right renal artery stenting. His other problems include hypertension and hyperlipidemia. He did have an episode of chest heaviness recently, but more importantly he has complained of progressive lifestyle limiting claudication since I last saw him. Most recent lower extremity Dopplers reveal a right ABI of 0.93 and a left of 0.82. He  does appear to have moderate iliac disease as well as high grade right SFA and popliteal stenosis. He underwent abdominal aortography with bifemoral runoff on 03/30/13 revealed a widely patent right renal artery stent, 50-60% proximal left renal artery stenosis. The left SFA was while he stayed patent and he had a 95% focal above-the-knee popliteal artery stenosis with three-vessel runoff. There was a 60% focal lesion in the right common iliac artery and a 50% segmental proximal to mid right SFA stenosis  with a patent stent in the mid to distal right SFA. He also had a 50% right popliteal stenosis with three-vessel runoff. He underwent cutting balloon angioplasty of the left above-the-knee popliteal artery and Angiosculpt balloon with an excellent angiographic result and was discharged the following day. I performed angiography on him 10/11/14 and recanalized a subtotally occluded right SFA with overlapping via Bahn covered stent performed intervention on his above-the-knee popliteal artery on that side. He had a patent mid to distal left SFA stent with high grade segmental and 6 sequential disease in the proximal SFA and distal SFA on the left. Since I saw him a year and a half ago he's had progressive lifestyle limiting claudication in his left calf. Addition, he's had severe progression of disease in his right internal carotid artery. I performed carotid stenting on him 01/03/17 with an excellent result. He underwent left lower extremity angiography by myself 02/03/17 revealing an occluded distal left SFA stent with otherwise high-grade segmental disease proximal to this and occluded anterior tibial. I performed cutting balloon atherectomy of his in-stent restenosis and the surrounding disease excellent angiographic result. His claudication has completely resolved. His left ABI has improved from 0.59 up to 1.   Because of recurrent claudication I re-angiogram him 05/11/2018 revealing a patent mid left SFA stent with high-grade segmental proximal left SFA stenosis which I performed Hawk 1 directional atherectomy followed by drug-eluting balloon angioplasty.  Claudication resolved and his Dopplers have normalized.  He did have RCA intervention by myself 03/13/2018 with a drug-eluting stent.  He currently denies chest pain, shortness of breath or claudication.  He had elective right left total knee replacement by Dr. Gladstone Lighter in June of this year which was uncomplicated.   Since I saw him in the office 2-1/2 months ago  he was doing well until 2 to 3 weeks ago when he started noticing exertional substernal chest burning similar to his prior stent symptoms.  He has taken several neck nitroglycerin with some relief.  He presents now for outpatient diagnostic coronary angiography to define his anatomy.       IMPRESSION: Joshua Wyatt'  previously placed RCA stent is patent although he appears to have an 80% ostial stenosis with damping using a 5 French catheter.  He also has "a napkin ring" 90% distal left main lesion and 40 to 50% segmental proximal LAD stenosis.  He had runs of ventricular tachycardia with contrast injection into the left main.  I believe the best revascularization strategy would be CABG.  Femoral closure was not performed because of a ostial right SFA stent.  The sheath will be removed and pressure held.  The patient left lab in stable condition.  T CTS has been consulted.   [name]. MD, [facility] 10/26/2021 8:32 AM         Procedural Details  Technical Details PROCEDURE DESCRIPTION:   The patient was brought to the second floor [facility] cath lab in the postabsorptive state. He was premedicated with IV Versed  and fentanyl. His right groinwas prepped and shaved in usual sterile fashion. Xylocaine 1% was used for local anesthesia. A 5 French sheath was inserted into the right common femoral artery using standard Seldinger technique.  5 French right left Judkins diagnostic catheters on the 5 French pigtail catheter used for selective coronary angiography and obtain left heart pressures.  Isovue dye was used for the entirety of the case (60 cc of contrast total to patient).  Retrograde aortic, left ventricular and pullback pressures were recorded.   Estimated blood loss <50 mL.   During this procedure medications were administered to achieve and maintain moderate conscious sedation while the patient's heart rate, blood pressure, and oxygen saturation were continuously monitored and I was  present face-to-face 100% of this time.   Medications (Filter: Administrations occurring from 0722 to 0807 on 10/26/21)  important  Continuous medications are totaled by the amount administered until 10/26/21 0807.    Heparin (Porcine) in NaCl 1000-0.9 UT/500ML-% SOLN (mL) Total volume:  1,000 mL  Date/Time Rate/Dose/Volume Action   10/26/21 0724 500 mL Given   0725 500 mL Given    midazolam (VERSED) injection (mg) Total dose:  1 mg  Date/Time Rate/Dose/Volume Action   10/26/21 0735 1 mg Given    fentaNYL (SUBLIMAZE) injection (mcg) Total dose:  25 mcg  Date/Time Rate/Dose/Volume Action   10/26/21 0736 25 mcg Given    lidocaine (PF) (XYLOCAINE) 1 % injection (mL) Total volume:  30 mL  Date/Time Rate/Dose/Volume Action   10/26/21 0742 30 mL Given    iodixanol (VISIPAQUE) 320 MG/ML injection (mL) Total volume:  60 mL  Date/Time Rate/Dose/Volume Action   10/26/21 0803 60 mL Given    Sedation Time  Sedation Time Physician-1: 22 minutes 42 seconds   Contrast  Medication Name Total Dose  iodixanol (VISIPAQUE) 320 MG/ML injection 60 mL    Radiation/Fluoro  Fluoro time: 5.2 (min) DAP: 70017 (mGycm2) Cumulative Air Kerma: 494 (mGy)   Coronary Findings   Diagnostic Dominance: Right  Left Main  Mid LM to Dist LM lesion is 90% stenosed.    Left Anterior Descending  Ost LAD to Mid LAD lesion is 50% stenosed.    Right Coronary Artery  Ost RCA lesion is 80% stenosed.  Previously placed Ost RCA to Prox RCA stent (unknown type) is widely patent.  Prox RCA to Mid RCA lesion is 40% stenosed.     Intervention    No interventions have been documented.   Coronary Diagrams   Diagnostic Dominance: Right        ECHOCARDIOGRAM REPORT         Patient Name:   Joshua Wyatt Date of Exam: 08/25/2021  Medical Rec #:  [id number]     Height:       67.5 in  Accession #:    [id number]    Weight:       174.0 lb  Date of Birth:  [birth year]    BSA:           1.917 m  Patient Age:    28 years      BP:           165/66 mmHg  Patient Gender: M             HR:           67 bpm.  Exam Location:  [place]   Procedure: 2D Echo, Cardiac Doppler and Color Doppler   Indications:    Z01.810 Preoperative cardiovascular examination.  I65.23 Bilateral carotid artery stenosis     History:        Patient has prior history of Echocardiogram examinations,  most                  recent 05/07/2019. CAD; Risk Factors:Hypertension and                  Dyslipidemia. Peripheral vascular disease. Murmur.     Sonographer:    Wilford Sports Rodgers-Jones RDCS  Referring Phys: Pearletha Forge BERRY   IMPRESSIONS     1. Left ventricular ejection fraction, by estimation, is 50 to 55%. The  left ventricle has low normal function. The left ventricle has no regional  wall motion abnormalities. Left ventricular diastolic parameters are  indeterminate.   2. Right ventricular systolic function is normal. The right ventricular  size is normal. Tricuspid regurgitation signal is inadequate for assessing  PA pressure.   3. Left atrial size was moderately dilated.   4. The mitral valve is abnormal. Mild mitral valve regurgitation. No  evidence of mitral stenosis.   5. The aortic valve has an indeterminant number of cusps. There is  moderate calcification of the aortic valve. There is moderate thickening  of the aortic valve. Aortic valve regurgitation is not visualized. Mild  aortic valve stenosis.   Comparison(s): Prior images reviewed side by side. Changes from prior  study are noted. Anterior MV leaflet calcification and aortic  calcification more pronounced on current study; global EF decreased (but  still low normal) without focal wall motion  abnormalities compared to prior.   FINDINGS   Left Ventricle: Left ventricular ejection fraction, by estimation, is 50  to 55%. The left ventricle has low normal function. The left ventricle has  no regional  wall motion abnormalities. The left ventricular internal  cavity size was normal in size.  There is no left ventricular hypertrophy. Left ventricular diastolic  parameters are indeterminate.   Right Ventricle: The right ventricular size is normal. Right vetricular  wall thickness was not well visualized. Right ventricular systolic  function is normal. Tricuspid regurgitation signal is inadequate for  assessing PA pressure.   Left Atrium: Left atrial size was moderately dilated.   Right Atrium: Right atrial size was normal in size.   Pericardium: There is no evidence of pericardial effusion. Presence of  pericardial fat pad.   Mitral Valve: The mitral valve is abnormal. There is moderate  calcification of the anterior mitral valve leaflet(s). Mild mitral valve  regurgitation. No evidence of mitral valve stenosis.   Tricuspid Valve: The tricuspid valve is normal in structure. Tricuspid  valve regurgitation is trivial. No evidence of tricuspid stenosis.   Aortic Valve: The aortic valve has an indeterminant number of cusps. There  is moderate calcification of the aortic valve. There is moderate  thickening of the aortic valve. There is moderate aortic valve annular  calcification. Aortic valve regurgitation  is not visualized. Mild aortic stenosis is present. Aortic valve mean  gradient measures 14.0 mmHg. Aortic valve peak gradient measures 24.2  mmHg. Aortic valve area, by VTI measures 1.29 cm.   Pulmonic Valve: The pulmonic valve was grossly normal. Pulmonic valve  regurgitation is trivial. No evidence of pulmonic stenosis.   Aorta: The aortic root and ascending aorta are structurally normal, with  no evidence of dilitation and the aortic arch was not well visualized.   Venous: The inferior vena cava was not well visualized.   IAS/Shunts: The atrial septum is grossly normal.  LEFT VENTRICLE  PLAX 2D  LVIDd:         4.40 cm  Diastology  LVIDs:         2.80 cm  LV e'  medial:    6.64 cm/s  LV PW:         1.00 cm  LV E/e' medial:  13.8  LV IVS:        1.00 cm  LV e' lateral:   9.03 cm/s  LVOT diam:     2.10 cm  LV E/e' lateral: 10.1  LV SV:         74  LV SV Index:   39  LVOT Area:     3.46 cm      RIGHT VENTRICLE             IVC  RV Basal diam:  3.10 cm     IVC diam: 1.60 cm  RV S prime:     13.40 cm/s  TAPSE (M-mode): 1.9 cm   LEFT ATRIUM             Index       RIGHT ATRIUM           Index  LA diam:        4.80 cm 2.50 cm/m  RA Area:     10.00 cm  LA Vol (A2C):   63.6 ml 33.18 ml/m RA Volume:   21.50 ml  11.22 ml/m  LA Vol (A4C):   60.1 ml 31.36 ml/m  LA Biplane Vol: 62.1 ml 32.40 ml/m   AORTIC VALVE  AV Area (Vmax):    1.33 cm  AV Area (Vmean):   1.21 cm  AV Area (VTI):     1.29 cm  AV Vmax:           246.00 cm/s  AV Vmean:          176.800 cm/s  AV VTI:            0.573 m  AV Peak Grad:      24.2 mmHg  AV Mean Grad:      14.0 mmHg  LVOT Vmax:         94.30 cm/s  LVOT Vmean:        61.900 cm/s  LVOT VTI:          0.214 m  LVOT/AV VTI ratio: 0.37     AORTA  Ao Root diam: 3.10 cm  Ao Asc diam:  3.40 cm   MITRAL VALVE  MV Area (PHT): 3.91 cm    SHUNTS  MV Decel Time: 194 msec    Systemic VTI:  0.21 m  MV E velocity: 91.60 cm/s  Systemic Diam: 2.10 cm  MV A velocity: 92.50 cm/s  MV E/A ratio:  0.99   Buford Dresser MD  Electronically signed by Buford Dresser MD  Signature Date/Time: 08/25/2021/1:46:01 PM      I have independently reviewed the above radiologic studies and discussed with the patient   Recent Lab Findings: Lab Results  Component Value Date   WBC 7.2 10/24/2021   HGB 10.7 (L) 10/24/2021   HCT 33.5 (L) 10/24/2021   PLT 281 10/24/2021   GLUCOSE 144 (H) 10/24/2021   CHOL 128 04/25/2020   TRIG 157 (H) 04/25/2020   HDL 34 (L) 04/25/2020   LDLDIRECT 97 02/03/2018   LDLCALC 67 04/25/2020   ALT 15 08/17/2021   AST 24 08/17/2021   NA 138 10/24/2021   K 3.7 10/24/2021  CL 101 10/24/2021    CREATININE 1.35 (H) 10/24/2021   BUN 13 10/24/2021   CO2 20 10/24/2021   TSH 0.80 01/23/2017   INR 1.0 04/24/2019   HGBA1C 6.2 (H) 05/07/2019      Assessment / Plan:    -CAD-75 year old gentleman with multiple cardiac risk factors and comorbidities as outlined above presents with exertional angina.  Elective left heart catheterization demonstrates severe left main coronary artery stenosis and two-vessel coronary artery disease with preserved LV function.  Coronary bypass grafting is his best option for revascularization.  The procedure and expected perioperative course was discussed with Joshua Wyatt and his questions were answered.  He would like for [place] to proceed with the necessary work-up in preparation for surgery.  Dr. Roxan Hockey will see Joshua Wyatt and review clinical data.  Discussion regarding timing of surgery will follow.  -Cerebrovascular disease status post bilateral carotid endarterectomy and subsequent stenting right carotid artery.  No history of stroke.  -Peripheral vascular disease-status post multiple percutaneous interventions in the lower extremities.  He has palpable dorsalis pedis pulses bilaterally.  -Degenerative joint disease-status post bilateral total knee replacements, most recent left knee replacement 6 weeks ago.  He has resumed playing golf.  -History of hypertension-controlled with amlodipine 5 mg daily and irbesartan 150 mg p.o. daily prior to admission.  -CKD, stage III-admission creatinine 1.35 and GFR 54.  History of renal artery stenosis with stenting right renal artery several years ago.   I  spent 25 minutes counseling the patient face to face.   Antony Odea, PA-C   10/26/2021 12:06 PM  Patient seen and examined, agree with above I reviewed the cath images. He has severe left main disease and ostial RCA stenosis. CABG indicated for survival benefit and relief of symptoms.  I have discussed the general nature of the procedure,  including the need for general anesthesia, the incisions to be used, the use of cardiopulmonary bypass, and the use of drainage tubes and temporary pacing wires postoperatively with Joshua Wyatt.  We discussed the expected hospital stay, overall recovery and short and long term outcomes.  He understand the risks include, but are not limited to death, stroke, MI, DVT/PE, bleeding, possible need for transfusion, infections, cardiac arrhythmias, as well as other organ system dysfunction including respiratory, renal, or GI complications.   He accepts the risks and agrees to proceed.   Revonda Standard Roxan Hockey, MD [facility] [phone]

## 2021-10-26 NOTE — H&P (View-Only) (Signed)
[address]       [place], [address]             [phone]        Aboubacar L Ferrin Hyndman Medical Record #[id number] Date of Birth: Apr 11, 1946  Referring: Lorretta Harp, MD  Primary Care: Janie Morning, DO Primary Cardiologist: Lorretta Harp, MD   Chief Complaint:   Chest pain.   History of Present Illness:     Mr. Eldon Zietlow. [name] is a very pleasant 75 year old gentleman with past medical history with known coronary artery disease status post PTCI with stenting of the ostial right coronary artery by Dr. Gwenlyn Found in 2019.  He also has a history of stage III chronic kidney disease, dyslipidemia, hypertension, cerebrovascular disease status post bilateral carotid endarterectomies and subsequent stenting of the right coronary artery by Dr. Gwenlyn Found.  He has had multiple percutaneous procedures to the lower extremities as well.  He has degenerative joint disease and is status post bilateral knee replacements, the most recent being in his left knee about 6 weeks ago.  He is a former 58-pack-year smoker having quit 8 years ago. [name] reports he was fairly active lifestyle after retiring from [employer] trades several years ago.  He plays golf 2-3 times a week.  He noticed in the last month that we have exertional chest pain while on the golf course.  This would subside with rest and sublingual nitroglycerin.  Most recent episode presenting from completing again.  He contacted his cardiologist to discuss the symptoms and arrangements were made for evaluation with left heart catheterization.  Study was performed earlier this morning by Dr. Gwenlyn Found and demonstrates severe left main coronary artery stenosis and two-vessel coronary artery disease.  The previously placed right coronary artery stent was patent but he appeared to have an 80% ostial RCA stenosis.  There was a 90% distal left main coronary stenosis followed by 40 to 50% since then stenoses in the LAD.  The  circumflex coronary system did not have any significant obstructive disease.  A left ventriculogram was not performed.  He had an echo in September of this year demonstrating left ventricular ejection fraction of 50 to 55%.  There was mild mitral insufficiency but no stenosis.  There was mild aortic stenosis but no aortic insufficiency.  There was no abnormalities of the proximal aorta. Dr. Gwenlyn Found has asked CT surgery to evaluate Mr. Ricard Dillon for consideration of coronary bypass grafting.  He is resting in bed Cath Lab.  He denies any chest pain or shortness of breath presently.     Current Activity/ Functional Status:   Zubrod Score: At the time of surgery this patient's most appropriate activity status/level should be described as: $RemoveBefor'[]'YsGjlxpZJbli$     0    Normal activity, no symptoms $RemoveBef'[x]'WEXxJGyzWJ$     1    Restricted in physical strenuous activity but ambulatory, able to do out light work $RemoveBe'[]'ffHruPFaV$     2    Ambulatory and capable of self care, unable to do work activities, up and about                 more than 50%  Of the time                            '[]'$     3    Only limited self care, in bed greater than 50% of waking hours $RemoveBefo'[]'dlrmnnnBJPz$   4    Completely disabled, no self care, confined to bed or chair $Remove'[]'NTXaGCN$     5    Moribund  Past Medical History:  Diagnosis Date   Arthritis    "LITTLE IN MY LEFT HAND" (05/12/2018)   Bilateral calf pain 03/02/2013   lower ext.doppler-mild arterial insufficiency to lower ext. this is a disease in val   CAD (coronary artery disease)    a. cath 02/24/18 - 90% ost RCA; 40% ost & pro LAD   Cerebral atherosclerosis 03/02/2013   carotid duplex- normal study   Deviated septum    RIGHT   GERD (gastroesophageal reflux disease)    Heart murmur    History of echocardiogram 08/02/2005   normal study    History of kidney stones    Hyperlipemia    Hypertension 03/30/2013   PV Angiogram- rt. renal artery stent was widely open,50-60% lt. renal artery stenosis,lt. SFA stents patent with high grade above the  knee  poplital stenosis   Hyponatremia 02/03/2017   Normal cardiac stress test 03/25/2013   EF 60%, normal stress test ,this is a presurgical  test   Peripheral vascular disease ([place])    PVD (peripheral vascular disease) ([place])    A. 2009: S/P PTA/stent to D SFA. B. 01/2017: angiosculpt atherectomy/drug-eluting balloon angioplasty to L SFA.   Renal artery stenosis ([place]) 03/02/2013   renal artery doppler-this was an abnormal doppler    Past Surgical History:  Procedure Laterality Date   APPENDECTOMY  1960's   CAROTID ENDARTERECTOMY Bilateral 2000's   CORONARY ANGIOPLASTY WITH STENT PLACEMENT  03/13/2018   CORONARY STENT INTERVENTION N/A 03/13/2018   Procedure: CORONARY STENT INTERVENTION;  Surgeon: Lorretta Harp, MD;  Location: [facility];  Service: Cardiovascular;  Laterality: N/A;   CYSTOSCOPY W/ STONE MANIPULATION  "X 4 or 5"   LEFT HEART CATH AND CORONARY ANGIOGRAPHY N/A 02/24/2018   Procedure: LEFT HEART CATH AND CORONARY ANGIOGRAPHY;  Surgeon: Lorretta Harp, MD;  Location: [place] [facility];  Service: Cardiovascular;  Laterality: N/A;   LITHOTRIPSY  "several"   LOWER EXTREMITY ANGIOGRAM N/A 10/11/2014   Procedure: LOWER EXTREMITY ANGIOGRAM;  Surgeon: Lorretta Harp, MD;  Location: [facility] CATH [facility];  Service: Cardiovascular;  Laterality: N/A;   LOWER EXTREMITY ANGIOGRAPHY N/A 02/04/2017   Procedure: Lower Extremity Angiography;  Surgeon: Lorretta Harp, MD;  Location: [place] [facility];  Service: Cardiovascular;  Laterality: N/A;   LOWER EXTREMITY ANGIOGRAPHY N/A 05/12/2018   Procedure: LOWER EXTREMITY ANGIOGRAPHY;  Surgeon: Lorretta Harp, MD;  Location: [place] [facility];  Service: Cardiovascular;  Laterality: N/A;   PERCUTANEOUS STENT INTERVENTION  10/11/2014   Procedure: PERCUTANEOUS STENT INTERVENTION;  Surgeon: Lorretta Harp, MD;  Location: [facility] CATH LAB;  Service: Cardiovascular;;  right sfax2   PERIPHERAL VASCULAR ATHERECTOMY  05/12/2018   Procedure: PERIPHERAL  VASCULAR ATHERECTOMY;  Surgeon: Lorretta Harp, MD;  Location: [place] [facility];  Service: Cardiovascular;;  left SFA   PERIPHERAL VASCULAR CATHETERIZATION N/A 01/03/2017   Procedure: Carotid PTA/Stent Intervention / Right;  Surgeon: Lorretta Harp, MD;  Location: [place] [facility];  Service: Cardiovascular;  Laterality: N/A;   PERIPHERAL VASCULAR INTERVENTION Left 02/04/2017   Procedure: Peripheral Vascular Intervention;  Surgeon: Lorretta Harp, MD;  Location: [place] [facility];  Service: Cardiovascular;  Laterality: Left;  left SFA   POPLITEAL ARTERY ANGIOPLASTY Left 03/30/2013   TONSILLECTOMY  1960's   TOTAL KNEE ARTHROPLASTY Right 05/06/2019   Procedure: TOTAL  KNEE ARTHROPLASTY;  Surgeon: Latanya Maudlin, MD;  Location: [facility];  Service: Orthopedics;  Laterality: Right;  170min   TOTAL KNEE ARTHROPLASTY Left 08/29/2021   Procedure: TOTAL KNEE ARTHROPLASTY;  Surgeon: Paralee Cancel, MD;  Location: [facility];  Service: Orthopedics;  Laterality: Left;    Social History   Tobacco Use  Smoking Status Former   Packs/day: 1.00   Years: 58.00   Pack years: 58.00   Types: Cigars, Cigarettes   Quit date: 10/18/2013   Years since quitting: 8.0  Smokeless Tobacco Never    Social History   Substance and Sexual Activity  Alcohol Use Not Currently     Allergies  Allergen Reactions   Ezetimibe     leg pains   Statins Swelling and Other (See Comments)    Muscle pain, also    Current Facility-Administered Medications  Medication Dose Route Frequency Provider Last Rate Last Admin   0.9 %  sodium chloride infusion  250 mL Intravenous PRN Lorretta Harp, MD       0.9 %  sodium chloride infusion   Intravenous Continuous Lorretta Harp, MD 75 mL/hr at 10/26/21 0820 New Bag at 10/26/21 0820   0.9% sodium chloride infusion  1 mL/kg/hr Intravenous Continuous Lorretta Harp, MD 78.5 mL/hr at 10/26/21 0557 1 mL/kg/hr at 10/26/21 0557   [START ON 10/27/2021] amLODipine (NORVASC) tablet  5 mg  5 mg Oral Daily Lorretta Harp, MD       aspirin chewable tablet 81 mg  81 mg Oral Pre-Cath Lorretta Harp, MD       fentaNYL (SUBLIMAZE) injection    PRN Lorretta Harp, MD   25 mcg at 10/26/21 0736   Heparin (Porcine) in NaCl 1000-0.9 UT/500ML-% SOLN    PRN Lorretta Harp, MD   500 mL at 10/26/21 0725   hydrALAZINE (APRESOLINE) injection 10 mg  10 mg Intravenous Q20 Min PRN Lorretta Harp, MD   10 mg at 10/26/21 0827   iodixanol (VISIPAQUE) 320 MG/ML injection    PRN Lorretta Harp, MD   60 mL at 10/26/21 0803   irbesartan (AVAPRO) tablet 150 mg  150 mg Oral Daily Lorretta Harp, MD   150 mg at 10/26/21 1147   labetalol (NORMODYNE) injection 10 mg  10 mg Intravenous Q10 min PRN Lorretta Harp, MD       lidocaine (PF) (XYLOCAINE) 1 % injection    PRN Lorretta Harp, MD   30 mL at 10/26/21 0742   midazolam (VERSED) injection    PRN Lorretta Harp, MD   1 mg at 10/26/21 0735   morphine 2 MG/ML injection 2 mg  2 mg Intravenous Q2H PRN Lorretta Harp, MD       ondansetron [facility]) injection 4 mg  4 mg Intravenous Q6H PRN Lorretta Harp, MD       [START ON 10/27/2021] pantoprazole (PROTONIX) EC tablet 40 mg  40 mg Oral Daily Lorretta Harp, MD       sodium chloride flush (NS) 0.9 % injection 3 mL  3 mL Intravenous PRN Lorretta Harp, MD        Facility-Administered Medications Prior to Admission  Medication Dose Route Frequency Provider Last Rate Last Admin   sodium chloride flush (NS) 0.9 % injection 3 mL  3 mL Intravenous Q12H Lorretta Harp, MD       Medications Prior to Admission  Medication Sig Dispense Refill Last Dose   acetaminophen (  TYLENOL) 500 MG tablet Take 500-1,000 mg by mouth every 6 (six) hours as needed (for pain).   Past Week   amLODipine (NORVASC) 5 MG tablet Take 1 tablet (5 mg total) by mouth daily. 90 tablet 2 10/26/2021 at 0500   aspirin EC 81 MG tablet Take 162 mg by mouth in the morning. Swallow whole.   10/26/2021 at  0500   ezetimibe (ZETIA) 10 MG tablet Take 1 tablet by mouth once daily 90 tablet 3 10/26/2021 at 0500   nitroGLYCERIN (NITROSTAT) 0.4 MG SL tablet PLACE 1 TABLET UNDER THE TONGUE  EVERY  5  MINUTES  AS  NEEDED  FOR  CHEST  PAIN 25 tablet 0 Past Week   pantoprazole (PROTONIX) 40 MG tablet Take 1 tablet by mouth once daily 90 tablet 3 10/26/2021 at [place] 420 MG/3.5ML SOCT Inject 420 mg into the skin every 30 (thirty) days.   Past Month   tamsulosin (FLOMAX) 0.4 MG CAPS capsule Take 0.4 mg by mouth at bedtime.   10/25/2021   docusate sodium (COLACE) 100 MG capsule Take 1 capsule (100 mg total) by mouth 2 (two) times daily. (Patient not taking: Reported on 10/25/2021) 10 capsule 0 Not Taking   HYDROcodone-acetaminophen (NORCO/VICODIN) 5-325 MG tablet Take 1 tablet by mouth every 4 (four) hours as needed for severe pain (pain score 7-10). (Patient not taking: Reported on 10/25/2021) 42 tablet 0 Not Taking   irbesartan (AVAPRO) 150 MG tablet Take 1 tablet (150 mg total) by mouth daily. (Patient not taking: Reported on 10/25/2021) 90 tablet 3 Not Taking   magnesium hydroxide (MILK OF MAGNESIA) 400 MG/5ML suspension Take 15-30 mLs by mouth daily as needed for mild constipation.   More than a month   methocarbamol (ROBAXIN) 500 MG tablet Take 1 tablet (500 mg total) by mouth every 6 (six) hours as needed for muscle spasms. (Patient not taking: Reported on 10/25/2021) 40 tablet 0 Not Taking   polyethylene glycol (MIRALAX / GLYCOLAX) 17 g packet Take 17 g by mouth daily as needed for mild constipation. (Patient not taking: Reported on 10/25/2021) 14 each 0 Not Taking    Family History  Problem Relation Age of Onset   Heart disease Father        CABG   Healthy Sister    Healthy Brother      Review of Systems:     Cardiac Review of Systems: Y or  [    ]= no  Chest Pain [  x  ]  Resting SOB [   ] Exertional SOB  [  ]  Orthopnea [  ]   Pedal Edema [   ]    Palpitations [ x  ] Syncope  [  ]   Presyncope [   ]  General Review of Systems: [Y] = yes [  ]=no Constitional: recent weight change [  ]; anorexia [  ]; fatigue [  ]; nausea [  ]; night sweats [  ]; fever [  ]; or chills [  ]                                                               Dental: Full plate top denture, all natural teeth I the lower jaw, no recent  issues.  Eye : blurred vision [  ]; diplopia [   ]; vision changes [  ];  Amaurosis fugax[  ]; Resp: cough [  ];  wheezing[  ];  hemoptysis[  ]; shortness of breath[  ]; paroxysmal nocturnal dyspnea[  ]; dyspnea on exertion[  ]; or orthopnea[  ];  GI:  gallstones[  ], vomiting[  ];  dysphagia[  ]; melena[  ];  hematochezia [  ]; heartburn[  ];   Hx of  Colonoscopy[  ]; GU: kidney stones [  ]; hematuria[  ];   dysuria [  ];  nocturia[  ];  history of     obstruction [  ]; urinary frequency [  ]             Skin: rash, swelling[  ];, hair loss[  ];  peripheral edema[  ];  or itching[  ]; Musculosketetal: myalgias[  ];  joint swelling[ x, left knee, recent Cabbell left knee replacement.];  joint erythema[  ];  joint pain[  ];  back pain[  ];  Heme/Lymph: bruising[  ];  bleeding[  ];  anemia[  ];  Neuro: TIA[  ];  headaches[  ];  stroke[  ];  vertigo[  ];  seizures[  ];   paresthesias[  ];  difficulty walking[  ];  Psych:depression[  ]; anxiety[  ];  Endocrine: diabetes[  ];  thyroid dysfunction[  ];                  Physical Exam: BP (!) 163/74   Pulse 87   Temp 98 F (36.7 C)   Resp 15   Wt 78.5 kg   SpO2 96%   BMI 26.70 kg/m    General appearance: alert, cooperative, appears stated age, and no distress Head: Normocephalic, without obvious abnormality, atraumatic Neck: no adenopathy, no JVD, supple, symmetrical, trachea midline, and there are bilateral carotid bruits Lymph nodes: No cervical or clavicular adenopathy Resp: clear to auscultation bilaterally Cardio: Irregular rhythm, monitor shows sinus rhythm with frequent PACs and a few  PVCs.  There is a grade 3/6 systolic murmur throughout the precordium. GI: Soft, nontender, active bowel sounds. Extremities: No obvious deformity.  He is status post bilateral knee replacements minimal joint swelling.  All extremities are well perfused.  Both radial pulses are easily palpable as well bilateral posterior tibial pulses.  Capillary refill in the right foot is delayed but brisk on left. Neurologic: Grossly normal  Diagnostic Studies & Laboratory data:     Recent Radiology Findings:   CARDIAC CATHETERIZATION        History obtained from chart review.CECILE GUEVARA is a 75 y.o.  thin appearing widowed Caucasian male with no children who I last saw in the office 08/08/2021.  Unfortunately, his wife of 24 years died at the age of 40 on 2020-10-26.  He is currently living alone.  He has a history of PVOD status post bilateral carotid endarterectomies performed by Dr. Drucie Opitz in 2007. We have been following him by duplex ultrasound which performed most recently several days ago revealed this to be widely patent. He has also had bilateral SFA intervention as well as right renal artery stenting. His other problems include hypertension and hyperlipidemia. He did have an episode of chest heaviness recently, but more importantly he has complained of progressive lifestyle limiting claudication since I last saw him. Most recent lower extremity Dopplers reveal a right ABI of 0.93 and a left of 0.82. He  does appear to have moderate iliac disease as well as high grade right SFA and popliteal stenosis. He underwent abdominal aortography with bifemoral runoff on 03/30/13 revealed a widely patent right renal artery stent, 50-60% proximal left renal artery stenosis. The left SFA was while he stayed patent and he had a 95% focal above-the-knee popliteal artery stenosis with three-vessel runoff. There was a 60% focal lesion in the right common iliac artery and a 50% segmental proximal to mid right SFA stenosis  with a patent stent in the mid to distal right SFA. He also had a 50% right popliteal stenosis with three-vessel runoff. He underwent cutting balloon angioplasty of the left above-the-knee popliteal artery and Angiosculpt balloon with an excellent angiographic result and was discharged the following day. I performed angiography on him 10/11/14 and recanalized a subtotally occluded right SFA with overlapping via Bahn covered stent performed intervention on his above-the-knee popliteal artery on that side. He had a patent mid to distal left SFA stent with high grade segmental and 6 sequential disease in the proximal SFA and distal SFA on the left. Since I saw him a year and a half ago he's had progressive lifestyle limiting claudication in his left calf. Addition, he's had severe progression of disease in his right internal carotid artery. I performed carotid stenting on him 01/03/17 with an excellent result. He underwent left lower extremity angiography by myself 02/03/17 revealing an occluded distal left SFA stent with otherwise high-grade segmental disease proximal to this and occluded anterior tibial. I performed cutting balloon atherectomy of his in-stent restenosis and the surrounding disease excellent angiographic result. His claudication has completely resolved. His left ABI has improved from 0.59 up to 1.   Because of recurrent claudication I re-angiogram him 05/11/2018 revealing a patent mid left SFA stent with high-grade segmental proximal left SFA stenosis which I performed Hawk 1 directional atherectomy followed by drug-eluting balloon angioplasty.  Claudication resolved and his Dopplers have normalized.  He did have RCA intervention by myself 03/13/2018 with a drug-eluting stent.  He currently denies chest pain, shortness of breath or claudication.  He had elective right left total knee replacement by Dr. Gladstone Lighter in June of this year which was uncomplicated.   Since I saw him in the office 2-1/2 months ago  he was doing well until 2 to 3 weeks ago when he started noticing exertional substernal chest burning similar to his prior stent symptoms.  He has taken several neck nitroglycerin with some relief.  He presents now for outpatient diagnostic coronary angiography to define his anatomy.       IMPRESSION: Mr. Tippin'  previously placed RCA stent is patent although he appears to have an 80% ostial stenosis with damping using a 5 French catheter.  He also has "a napkin ring" 90% distal left main lesion and 40 to 50% segmental proximal LAD stenosis.  He had runs of ventricular tachycardia with contrast injection into the left main.  I believe the best revascularization strategy would be CABG.  Femoral closure was not performed because of a ostial right SFA stent.  The sheath will be removed and pressure held.  The patient left lab in stable condition.  T CTS has been consulted.   [name]. MD, [facility] 10/26/2021 8:32 AM         Procedural Details  Technical Details PROCEDURE DESCRIPTION:   The patient was brought to the second floor [facility] cath lab in the postabsorptive state. He was premedicated with IV Versed  and fentanyl. His right groinwas prepped and shaved in usual sterile fashion. Xylocaine 1% was used for local anesthesia. A 5 French sheath was inserted into the right common femoral artery using standard Seldinger technique.  5 French right left Judkins diagnostic catheters on the 5 French pigtail catheter used for selective coronary angiography and obtain left heart pressures.  Isovue dye was used for the entirety of the case (60 cc of contrast total to patient).  Retrograde aortic, left ventricular and pullback pressures were recorded.   Estimated blood loss <50 mL.   During this procedure medications were administered to achieve and maintain moderate conscious sedation while the patient's heart rate, blood pressure, and oxygen saturation were continuously monitored and I was  present face-to-face 100% of this time.   Medications (Filter: Administrations occurring from 0722 to 0807 on 10/26/21)  important  Continuous medications are totaled by the amount administered until 10/26/21 0807.    Heparin (Porcine) in NaCl 1000-0.9 UT/500ML-% SOLN (mL) Total volume:  1,000 mL  Date/Time Rate/Dose/Volume Action   10/26/21 0724 500 mL Given   0725 500 mL Given    midazolam (VERSED) injection (mg) Total dose:  1 mg  Date/Time Rate/Dose/Volume Action   10/26/21 0735 1 mg Given    fentaNYL (SUBLIMAZE) injection (mcg) Total dose:  25 mcg  Date/Time Rate/Dose/Volume Action   10/26/21 0736 25 mcg Given    lidocaine (PF) (XYLOCAINE) 1 % injection (mL) Total volume:  30 mL  Date/Time Rate/Dose/Volume Action   10/26/21 0742 30 mL Given    iodixanol (VISIPAQUE) 320 MG/ML injection (mL) Total volume:  60 mL  Date/Time Rate/Dose/Volume Action   10/26/21 0803 60 mL Given    Sedation Time  Sedation Time Physician-1: 22 minutes 42 seconds   Contrast  Medication Name Total Dose  iodixanol (VISIPAQUE) 320 MG/ML injection 60 mL    Radiation/Fluoro  Fluoro time: 5.2 (min) DAP: 40814 (mGycm2) Cumulative Air Kerma: 481 (mGy)   Coronary Findings   Diagnostic Dominance: Right  Left Main  Mid LM to Dist LM lesion is 90% stenosed.    Left Anterior Descending  Ost LAD to Mid LAD lesion is 50% stenosed.    Right Coronary Artery  Ost RCA lesion is 80% stenosed.  Previously placed Ost RCA to Prox RCA stent (unknown type) is widely patent.  Prox RCA to Mid RCA lesion is 40% stenosed.     Intervention    No interventions have been documented.   Coronary Diagrams   Diagnostic Dominance: Right        ECHOCARDIOGRAM REPORT         Patient Name:   DOMONIK LEVARIO Date of Exam: 08/25/2021  Medical Rec #:  [id number]     Height:       67.5 in  Accession #:    [id number]    Weight:       174.0 lb  Date of Birth:  01-16-1946    BSA:           1.917 m  Patient Age:    25 years      BP:           165/66 mmHg  Patient Gender: M             HR:           67 bpm.  Exam Location:  [facility]   Procedure: 2D Echo, Cardiac Doppler and Color Doppler   Indications:    Z01.810 Preoperative cardiovascular examination.  I65.23 Bilateral carotid artery stenosis     History:        Patient has prior history of Echocardiogram examinations,  most                  recent 05/07/2019. CAD; Risk Factors:Hypertension and                  Dyslipidemia. Peripheral vascular disease. Murmur.     Sonographer:    Wilford Sports Rodgers-Jones RDCS  Referring Phys: Pearletha Forge BERRY   IMPRESSIONS     1. Left ventricular ejection fraction, by estimation, is 50 to 55%. The  left ventricle has low normal function. The left ventricle has no regional  wall motion abnormalities. Left ventricular diastolic parameters are  indeterminate.   2. Right ventricular systolic function is normal. The right ventricular  size is normal. Tricuspid regurgitation signal is inadequate for assessing  PA pressure.   3. Left atrial size was moderately dilated.   4. The mitral valve is abnormal. Mild mitral valve regurgitation. No  evidence of mitral stenosis.   5. The aortic valve has an indeterminant number of cusps. There is  moderate calcification of the aortic valve. There is moderate thickening  of the aortic valve. Aortic valve regurgitation is not visualized. Mild  aortic valve stenosis.   Comparison(s): Prior images reviewed side by side. Changes from prior  study are noted. Anterior MV leaflet calcification and aortic  calcification more pronounced on current study; global EF decreased (but  still low normal) without focal wall motion  abnormalities compared to prior.   FINDINGS   Left Ventricle: Left ventricular ejection fraction, by estimation, is 50  to 55%. The left ventricle has low normal function. The left ventricle has  no regional  wall motion abnormalities. The left ventricular internal  cavity size was normal in size.  There is no left ventricular hypertrophy. Left ventricular diastolic  parameters are indeterminate.   Right Ventricle: The right ventricular size is normal. Right vetricular  wall thickness was not well visualized. Right ventricular systolic  function is normal. Tricuspid regurgitation signal is inadequate for  assessing PA pressure.   Left Atrium: Left atrial size was moderately dilated.   Right Atrium: Right atrial size was normal in size.   Pericardium: There is no evidence of pericardial effusion. Presence of  pericardial fat pad.   Mitral Valve: The mitral valve is abnormal. There is moderate  calcification of the anterior mitral valve leaflet(s). Mild mitral valve  regurgitation. No evidence of mitral valve stenosis.   Tricuspid Valve: The tricuspid valve is normal in structure. Tricuspid  valve regurgitation is trivial. No evidence of tricuspid stenosis.   Aortic Valve: The aortic valve has an indeterminant number of cusps. There  is moderate calcification of the aortic valve. There is moderate  thickening of the aortic valve. There is moderate aortic valve annular  calcification. Aortic valve regurgitation  is not visualized. Mild aortic stenosis is present. Aortic valve mean  gradient measures 14.0 mmHg. Aortic valve peak gradient measures 24.2  mmHg. Aortic valve area, by VTI measures 1.29 cm.   Pulmonic Valve: The pulmonic valve was grossly normal. Pulmonic valve  regurgitation is trivial. No evidence of pulmonic stenosis.   Aorta: The aortic root and ascending aorta are structurally normal, with  no evidence of dilitation and the aortic arch was not well visualized.   Venous: The inferior vena cava was not well visualized.   IAS/Shunts: The atrial septum is grossly normal.  LEFT VENTRICLE  PLAX 2D  LVIDd:         4.40 cm  Diastology  LVIDs:         2.80 cm  LV e'  medial:    6.64 cm/s  LV PW:         1.00 cm  LV E/e' medial:  13.8  LV IVS:        1.00 cm  LV e' lateral:   9.03 cm/s  LVOT diam:     2.10 cm  LV E/e' lateral: 10.1  LV SV:         74  LV SV Index:   39  LVOT Area:     3.46 cm      RIGHT VENTRICLE             IVC  RV Basal diam:  3.10 cm     IVC diam: 1.60 cm  RV S prime:     13.40 cm/s  TAPSE (M-mode): 1.9 cm   LEFT ATRIUM             Index       RIGHT ATRIUM           Index  LA diam:        4.80 cm 2.50 cm/m  RA Area:     10.00 cm  LA Vol (A2C):   63.6 ml 33.18 ml/m RA Volume:   21.50 ml  11.22 ml/m  LA Vol (A4C):   60.1 ml 31.36 ml/m  LA Biplane Vol: 62.1 ml 32.40 ml/m   AORTIC VALVE  AV Area (Vmax):    1.33 cm  AV Area (Vmean):   1.21 cm  AV Area (VTI):     1.29 cm  AV Vmax:           246.00 cm/s  AV Vmean:          176.800 cm/s  AV VTI:            0.573 m  AV Peak Grad:      24.2 mmHg  AV Mean Grad:      14.0 mmHg  LVOT Vmax:         94.30 cm/s  LVOT Vmean:        61.900 cm/s  LVOT VTI:          0.214 m  LVOT/AV VTI ratio: 0.37     AORTA  Ao Root diam: 3.10 cm  Ao Asc diam:  3.40 cm   MITRAL VALVE  MV Area (PHT): 3.91 cm    SHUNTS  MV Decel Time: 194 msec    Systemic VTI:  0.21 m  MV E velocity: 91.60 cm/s  Systemic Diam: 2.10 cm  MV A velocity: 92.50 cm/s  MV E/A ratio:  0.99   Buford Dresser MD  Electronically signed by Buford Dresser MD  Signature Date/Time: 08/25/2021/1:46:01 PM      I have independently reviewed the above radiologic studies and discussed with the patient   Recent Lab Findings: Lab Results  Component Value Date   WBC 7.2 10/24/2021   HGB 10.7 (L) 10/24/2021   HCT 33.5 (L) 10/24/2021   PLT 281 10/24/2021   GLUCOSE 144 (H) 10/24/2021   CHOL 128 04/25/2020   TRIG 157 (H) 04/25/2020   HDL 34 (L) 04/25/2020   LDLDIRECT 97 02/03/2018   LDLCALC 67 04/25/2020   ALT 15 08/17/2021   AST 24 08/17/2021   NA 138 10/24/2021   K 3.7 10/24/2021  CL 101 10/24/2021    CREATININE 1.35 (H) 10/24/2021   BUN 13 10/24/2021   CO2 20 10/24/2021   TSH 0.80 01/23/2017   INR 1.0 04/24/2019   HGBA1C 6.2 (H) 05/07/2019      Assessment / Plan:    -CAD-75 year old gentleman with multiple cardiac risk factors and comorbidities as outlined above presents with exertional angina.  Elective left heart catheterization demonstrates severe left main coronary artery stenosis and two-vessel coronary artery disease with preserved LV function.  Coronary bypass grafting is his best option for revascularization.  The procedure and expected perioperative course was discussed with Mr. Bair and his questions were answered.  He would like for [place] to proceed with the necessary work-up in preparation for surgery.  Dr. Roxan Hockey will see Mr. Ricard Dillon and review clinical data.  Discussion regarding timing of surgery will follow.  -Cerebrovascular disease status post bilateral carotid endarterectomy and subsequent stenting right carotid artery.  No history of stroke.  -Peripheral vascular disease-status post multiple percutaneous interventions in the lower extremities.  He has palpable dorsalis pedis pulses bilaterally.  -Degenerative joint disease-status post bilateral total knee replacements, most recent left knee replacement 6 weeks ago.  He has resumed playing golf.  -History of hypertension-controlled with amlodipine 5 mg daily and irbesartan 150 mg p.o. daily prior to admission.  -CKD, stage III-admission creatinine 1.35 and GFR 54.  History of renal artery stenosis with stenting right renal artery several years ago.   I  spent 25 minutes counseling the patient face to face.   Antony Odea, PA-C   10/26/2021 12:06 PM  Patient seen and examined, agree with above I reviewed the cath images. He has severe left main disease and ostial RCA stenosis. CABG indicated for survival benefit and relief of symptoms.  I have discussed the general nature of the procedure,  including the need for general anesthesia, the incisions to be used, the use of cardiopulmonary bypass, and the use of drainage tubes and temporary pacing wires postoperatively with Mr. Towry.  We discussed the expected hospital stay, overall recovery and short and long term outcomes.  He understand the risks include, but are not limited to death, stroke, MI, DVT/PE, bleeding, possible need for transfusion, infections, cardiac arrhythmias, as well as other organ system dysfunction including respiratory, renal, or GI complications.   He accepts the risks and agrees to proceed.   Revonda Standard Roxan Hockey, MD [facility] [phone]

## 2021-10-26 NOTE — Progress Notes (Addendum)
SITE AREA:right groin/femoral  SITE PRIOR TO REMOVAL:  LEVEL 0  PRESSURE APPLIED FOR: approximately 25 minutes  MANUAL: yes  PATIENT STATUS DURING PULL: stable  POST PULL SITE:  LEVEL 0  POST PULL INSTRUCTIONS GIVEN: yes  POST PULL PULSES PRESENT: bilateral pedal pulses at +1  DRESSING APPLIED: gauze with tegaderm  BEDREST BEGINS @ 0906  COMMENTS:  sheath removed by 2H RN, [place] PO fluids given, food refused at this time

## 2021-10-26 NOTE — Progress Notes (Signed)
Pre-CABG Dopplers completed. Refer to "CV Proc" under chart review to view preliminary results.  10/26/2021 5:28 PM Kelby Aline., MHA, RVT, RDCS, RDMS

## 2021-10-26 NOTE — Progress Notes (Signed)
   10/26/21 1634  Clinical Encounter Type  Visited With Other (Comment) (Nurse called)  Visit Type Initial;Other (Comment) (Inquiring about notary)  Referral From Nurse  Consult/Referral To Chaplain   Chaplain received call from unit requesting [facility] notary. Chaplain Baker Janus advised [place] notary's hours. Ike Bene offered to assist if alternate notary can be used. This note was prepared by Jeanine Luz, M.Div..  For questions please contact by phone [phone].

## 2021-10-26 NOTE — Interval H&P Note (Signed)
Cath Lab Visit (complete for each Cath Lab visit)  Clinical Evaluation Leading to the Procedure:   ACS: No.  Non-ACS:    Anginal Classification: CCS II  Anti-ischemic medical therapy: Maximal Therapy (2 or more classes of medications)  Non-Invasive Test Results: No non-invasive testing performed  Prior CABG: No previous CABG      History and Physical Interval Note:  10/26/2021 7:39 AM  Joshua Wyatt  has presented today for surgery, with the diagnosis of unstable angina.  The various methods of treatment have been discussed with the patient and family. After consideration of risks, benefits and other options for treatment, the patient has consented to  Procedure(s): LEFT HEART CATH AND CORONARY ANGIOGRAPHY (N/A) as a surgical intervention.  The patient's history has been reviewed, patient examined, no change in status, stable for surgery.  I have reviewed the patient's chart and labs.  Questions were answered to the patient's satisfaction.     Joshua Wyatt

## 2021-10-26 NOTE — Progress Notes (Signed)
Chaplain responded to request for advanced directive to be completed.  Chaplain assisted the patient in completing the paperwork and coordinated AC to come and notarize along with securing 2 witnesses.  Patient have procedure tomorrow and wanted to complete it.  Chaplain gave original to the family and placed copy in the chart.  No other needs at this time. [place], Mdiv.    10/26/21 2025  Clinical Encounter Type  Visited With Patient and family together  Visit Type Follow-up  Referral From Family;Nurse  Consult/Referral To Chaplain

## 2021-10-27 ENCOUNTER — Observation Stay (HOSPITAL_COMMUNITY): Payer: Medicare Other

## 2021-10-27 ENCOUNTER — Inpatient Hospital Stay (HOSPITAL_COMMUNITY): Payer: Medicare Other | Admitting: Certified Registered"

## 2021-10-27 ENCOUNTER — Encounter (HOSPITAL_COMMUNITY)
Admission: RE | Disposition: A | Discharge status: Home / Self Care | Payer: Self-pay | Source: Home / Self Care | Attending: Thoracic Surgery (Cardiothoracic Vascular Surgery)

## 2021-10-27 ENCOUNTER — Inpatient Hospital Stay (HOSPITAL_COMMUNITY): Payer: Medicare Other

## 2021-10-27 ENCOUNTER — Encounter (HOSPITAL_COMMUNITY): Payer: Self-pay | Admitting: Cardiovascular Disease

## 2021-10-27 DIAGNOSIS — K219 Gastro-esophageal reflux disease without esophagitis: Secondary | ICD-10-CM | POA: Diagnosis present

## 2021-10-27 DIAGNOSIS — I5022 Chronic systolic (congestive) heart failure: Secondary | ICD-10-CM | POA: Diagnosis present

## 2021-10-27 DIAGNOSIS — D62 Acute posthemorrhagic anemia: Secondary | ICD-10-CM | POA: Diagnosis not present

## 2021-10-27 DIAGNOSIS — Z955 Presence of coronary angioplasty implant and graft: Secondary | ICD-10-CM | POA: Diagnosis not present

## 2021-10-27 DIAGNOSIS — I25118 Atherosclerotic heart disease of native coronary artery with other forms of angina pectoris: Secondary | ICD-10-CM | POA: Diagnosis present

## 2021-10-27 DIAGNOSIS — E785 Hyperlipidemia, unspecified: Secondary | ICD-10-CM | POA: Diagnosis present

## 2021-10-27 DIAGNOSIS — I130 Hypertensive heart and chronic kidney disease with heart failure and stage 1 through stage 4 chronic kidney disease, or unspecified chronic kidney disease: Secondary | ICD-10-CM | POA: Diagnosis present

## 2021-10-27 DIAGNOSIS — I10 Essential (primary) hypertension: Secondary | ICD-10-CM | POA: Diagnosis not present

## 2021-10-27 DIAGNOSIS — I2510 Atherosclerotic heart disease of native coronary artery without angina pectoris: Secondary | ICD-10-CM

## 2021-10-27 DIAGNOSIS — I25110 Atherosclerotic heart disease of native coronary artery with unstable angina pectoris: Secondary | ICD-10-CM | POA: Diagnosis not present

## 2021-10-27 DIAGNOSIS — J9811 Atelectasis: Secondary | ICD-10-CM | POA: Diagnosis not present

## 2021-10-27 DIAGNOSIS — Z96653 Presence of artificial knee joint, bilateral: Secondary | ICD-10-CM | POA: Diagnosis present

## 2021-10-27 DIAGNOSIS — D696 Thrombocytopenia, unspecified: Secondary | ICD-10-CM | POA: Diagnosis present

## 2021-10-27 DIAGNOSIS — Z8249 Family history of ischemic heart disease and other diseases of the circulatory system: Secondary | ICD-10-CM | POA: Diagnosis not present

## 2021-10-27 DIAGNOSIS — I209 Angina pectoris, unspecified: Secondary | ICD-10-CM | POA: Diagnosis not present

## 2021-10-27 DIAGNOSIS — R7303 Prediabetes: Secondary | ICD-10-CM | POA: Diagnosis present

## 2021-10-27 DIAGNOSIS — I441 Atrioventricular block, second degree: Secondary | ICD-10-CM | POA: Diagnosis present

## 2021-10-27 DIAGNOSIS — Z888 Allergy status to other drugs, medicaments and biological substances status: Secondary | ICD-10-CM | POA: Diagnosis not present

## 2021-10-27 DIAGNOSIS — Z01818 Encounter for other preprocedural examination: Secondary | ICD-10-CM | POA: Diagnosis not present

## 2021-10-27 DIAGNOSIS — I480 Paroxysmal atrial fibrillation: Secondary | ICD-10-CM | POA: Diagnosis present

## 2021-10-27 DIAGNOSIS — N179 Acute kidney failure, unspecified: Secondary | ICD-10-CM | POA: Diagnosis present

## 2021-10-27 DIAGNOSIS — I739 Peripheral vascular disease, unspecified: Secondary | ICD-10-CM | POA: Diagnosis not present

## 2021-10-27 DIAGNOSIS — I70203 Unspecified atherosclerosis of native arteries of extremities, bilateral legs: Secondary | ICD-10-CM | POA: Diagnosis present

## 2021-10-27 DIAGNOSIS — I701 Atherosclerosis of renal artery: Secondary | ICD-10-CM | POA: Diagnosis present

## 2021-10-27 DIAGNOSIS — Z87891 Personal history of nicotine dependence: Secondary | ICD-10-CM | POA: Diagnosis not present

## 2021-10-27 DIAGNOSIS — I2584 Coronary atherosclerosis due to calcified coronary lesion: Secondary | ICD-10-CM | POA: Diagnosis present

## 2021-10-27 DIAGNOSIS — N1831 Chronic kidney disease, stage 3a: Secondary | ICD-10-CM | POA: Diagnosis present

## 2021-10-27 DIAGNOSIS — I340 Nonrheumatic mitral (valve) insufficiency: Secondary | ICD-10-CM | POA: Diagnosis present

## 2021-10-27 DIAGNOSIS — I472 Ventricular tachycardia, unspecified: Secondary | ICD-10-CM | POA: Diagnosis present

## 2021-10-27 DIAGNOSIS — Z20822 Contact with and (suspected) exposure to covid-19: Secondary | ICD-10-CM | POA: Diagnosis present

## 2021-10-27 HISTORY — PX: CORONARY ARTERY BYPASS GRAFT: SHX141

## 2021-10-27 HISTORY — PX: ENDOVEIN HARVEST OF GREATER SAPHENOUS VEIN: SHX5059

## 2021-10-27 HISTORY — PX: TEE WITHOUT CARDIOVERSION: SHX5443

## 2021-10-27 LAB — POCT I-STAT, CHEM 8
BUN: 12 mg/dL (ref 8–23)
BUN: 12 mg/dL (ref 8–23)
BUN: 12 mg/dL (ref 8–23)
BUN: 13 mg/dL (ref 8–23)
Calcium, Ion: 1.38 mmol/L (ref 1.15–1.40)
Calcium, Ion: 1.34 mmol/L (ref 1.15–1.40)
Calcium, Ion: 1.17 mmol/L (ref 1.15–1.40)
Calcium, Ion: 1.22 mmol/L (ref 1.15–1.40)
Chloride: 106 mmol/L (ref 98–111)
Chloride: 106 mmol/L (ref 98–111)
Chloride: 103 mmol/L (ref 98–111)
Chloride: 106 mmol/L (ref 98–111)
Creatinine, Ser: 1.1 mg/dL (ref 0.61–1.24)
Creatinine, Ser: 1.1 mg/dL (ref 0.61–1.24)
Creatinine, Ser: 1 mg/dL (ref 0.61–1.24)
Creatinine, Ser: 1.1 mg/dL (ref 0.61–1.24)
Glucose, Bld: 99 mg/dL (ref 70–99)
Glucose, Bld: 105 mg/dL — ABNORMAL HIGH (ref 70–99)
Glucose, Bld: 122 mg/dL — ABNORMAL HIGH (ref 70–99)
Glucose, Bld: 138 mg/dL — ABNORMAL HIGH (ref 70–99)
HCT: 31 % — ABNORMAL LOW (ref 39.0–52.0)
HCT: 25 % — ABNORMAL LOW (ref 39.0–52.0)
HCT: 24 % — ABNORMAL LOW (ref 39.0–52.0)
HCT: 30 % — ABNORMAL LOW (ref 39.0–52.0)
Hemoglobin: 10.5 g/dL — ABNORMAL LOW (ref 13.0–17.0)
Hemoglobin: 8.5 g/dL — ABNORMAL LOW (ref 13.0–17.0)
Hemoglobin: 8.2 g/dL — ABNORMAL LOW (ref 13.0–17.0)
Hemoglobin: 10.2 g/dL — ABNORMAL LOW (ref 13.0–17.0)
Potassium: 3.5 mmol/L (ref 3.5–5.1)
Potassium: 3.5 mmol/L (ref 3.5–5.1)
Potassium: 4.6 mmol/L (ref 3.5–5.1)
Potassium: 4.4 mmol/L (ref 3.5–5.1)
Sodium: 144 mmol/L (ref 135–145)
Sodium: 141 mmol/L (ref 135–145)
Sodium: 141 mmol/L (ref 135–145)
Sodium: 141 mmol/L (ref 135–145)
TCO2: 22 mmol/L (ref 22–32)
TCO2: 22 mmol/L (ref 22–32)
TCO2: 24 mmol/L (ref 22–32)
TCO2: 22 mmol/L (ref 22–32)

## 2021-10-27 LAB — ECHOCARDIOGRAM COMPLETE
AR max vel: 0.97 cm2
AV Area VTI: 1.12 cm2
AV Area mean vel: 1.13 cm2
AV Mean grad: 16 mmHg
AV Peak grad: 33.9 mmHg
Ao pk vel: 2.91 m/s
Area-P 1/2: 2.61 cm2
Calc EF: 45.8 %
Height: 67.5 in
S' Lateral: 3.8 cm
Single Plane A2C EF: 47.8 %
Single Plane A4C EF: 47.9 %
Weight: 2768 oz

## 2021-10-27 LAB — POCT I-STAT 7, (LYTES, BLD GAS, ICA,H+H)
Acid-Base Excess: 1 mmol/L (ref 0.0–2.0)
Acid-Base Excess: 2 mmol/L (ref 0.0–2.0)
Acid-Base Excess: 5 mmol/L — ABNORMAL HIGH (ref 0.0–2.0)
Acid-Base Excess: 2 mmol/L (ref 0.0–2.0)
Acid-base deficit: 3 mmol/L — ABNORMAL HIGH (ref 0.0–2.0)
Acid-base deficit: 1 mmol/L (ref 0.0–2.0)
Bicarbonate: 22.7 mmol/L (ref 20.0–28.0)
Bicarbonate: 24.6 mmol/L (ref 20.0–28.0)
Bicarbonate: 25.5 mmol/L (ref 20.0–28.0)
Bicarbonate: 28.8 mmol/L — ABNORMAL HIGH (ref 20.0–28.0)
Bicarbonate: 22.7 mmol/L (ref 20.0–28.0)
Bicarbonate: 25.3 mmol/L (ref 20.0–28.0)
Calcium, Ion: 1.37 mmol/L (ref 1.15–1.40)
Calcium, Ion: 1.08 mmol/L — ABNORMAL LOW (ref 1.15–1.40)
Calcium, Ion: 1.12 mmol/L — ABNORMAL LOW (ref 1.15–1.40)
Calcium, Ion: 1.14 mmol/L — ABNORMAL LOW (ref 1.15–1.40)
Calcium, Ion: 1.17 mmol/L (ref 1.15–1.40)
Calcium, Ion: 1.21 mmol/L (ref 1.15–1.40)
HCT: 27 % — ABNORMAL LOW (ref 39.0–52.0)
HCT: 20 % — ABNORMAL LOW (ref 39.0–52.0)
HCT: 23 % — ABNORMAL LOW (ref 39.0–52.0)
HCT: 23 % — ABNORMAL LOW (ref 39.0–52.0)
HCT: 24 % — ABNORMAL LOW (ref 39.0–52.0)
HCT: 27 % — ABNORMAL LOW (ref 39.0–52.0)
Hemoglobin: 9.2 g/dL — ABNORMAL LOW (ref 13.0–17.0)
Hemoglobin: 6.8 g/dL — CL (ref 13.0–17.0)
Hemoglobin: 7.8 g/dL — ABNORMAL LOW (ref 13.0–17.0)
Hemoglobin: 7.8 g/dL — ABNORMAL LOW (ref 13.0–17.0)
Hemoglobin: 8.2 g/dL — ABNORMAL LOW (ref 13.0–17.0)
Hemoglobin: 9.2 g/dL — ABNORMAL LOW (ref 13.0–17.0)
O2 Saturation: 99 %
O2 Saturation: 100 %
O2 Saturation: 100 %
O2 Saturation: 100 %
O2 Saturation: 100 %
O2 Saturation: 92 %
Potassium: 3.5 mmol/L (ref 3.5–5.1)
Potassium: 4.2 mmol/L (ref 3.5–5.1)
Potassium: 4.8 mmol/L (ref 3.5–5.1)
Potassium: 5 mmol/L (ref 3.5–5.1)
Potassium: 4.4 mmol/L (ref 3.5–5.1)
Potassium: 5.1 mmol/L (ref 3.5–5.1)
Sodium: 143 mmol/L (ref 135–145)
Sodium: 139 mmol/L (ref 135–145)
Sodium: 141 mmol/L (ref 135–145)
Sodium: 141 mmol/L (ref 135–145)
Sodium: 139 mmol/L (ref 135–145)
Sodium: 140 mmol/L (ref 135–145)
TCO2: 24 mmol/L (ref 22–32)
TCO2: 26 mmol/L (ref 22–32)
TCO2: 27 mmol/L (ref 22–32)
TCO2: 30 mmol/L (ref 22–32)
TCO2: 24 mmol/L (ref 22–32)
TCO2: 26 mmol/L (ref 22–32)
pCO2 arterial: 42.7 mmHg (ref 32.0–48.0)
pCO2 arterial: 35.4 mmHg (ref 32.0–48.0)
pCO2 arterial: 36.1 mmHg (ref 32.0–48.0)
pCO2 arterial: 37.8 mmHg (ref 32.0–48.0)
pCO2 arterial: 33.8 mmHg (ref 32.0–48.0)
pCO2 arterial: 34.6 mmHg (ref 32.0–48.0)
pH, Arterial: 7.334 — ABNORMAL LOW (ref 7.350–7.450)
pH, Arterial: 7.45 (ref 7.350–7.450)
pH, Arterial: 7.458 — ABNORMAL HIGH (ref 7.350–7.450)
pH, Arterial: 7.49 — ABNORMAL HIGH (ref 7.350–7.450)
pH, Arterial: 7.436 (ref 7.350–7.450)
pH, Arterial: 7.472 — ABNORMAL HIGH (ref 7.350–7.450)
pO2, Arterial: 171 mmHg — ABNORMAL HIGH (ref 83.0–108.0)
pO2, Arterial: 384 mmHg — ABNORMAL HIGH (ref 83.0–108.0)
pO2, Arterial: 432 mmHg — ABNORMAL HIGH (ref 83.0–108.0)
pO2, Arterial: 441 mmHg — ABNORMAL HIGH (ref 83.0–108.0)
pO2, Arterial: 355 mmHg — ABNORMAL HIGH (ref 83.0–108.0)
pO2, Arterial: 60 mmHg — ABNORMAL LOW (ref 83.0–108.0)

## 2021-10-27 LAB — POCT I-STAT EG7
Acid-base deficit: 1 mmol/L (ref 0.0–2.0)
Bicarbonate: 23.6 mmol/L (ref 20.0–28.0)
Calcium, Ion: 1.18 mmol/L (ref 1.15–1.40)
HCT: 20 % — ABNORMAL LOW (ref 39.0–52.0)
Hemoglobin: 6.8 g/dL — CL (ref 13.0–17.0)
O2 Saturation: 74 %
Potassium: 3.8 mmol/L (ref 3.5–5.1)
Sodium: 142 mmol/L (ref 135–145)
TCO2: 25 mmol/L (ref 22–32)
pCO2, Ven: 37.8 mmHg — ABNORMAL LOW (ref 44.0–60.0)
pH, Ven: 7.404 (ref 7.250–7.430)
pO2, Ven: 39 mmHg (ref 32.0–45.0)

## 2021-10-27 LAB — APTT
aPTT: 28 seconds (ref 24–36)
aPTT: 26 seconds (ref 24–36)

## 2021-10-27 LAB — BASIC METABOLIC PANEL
Anion gap: 7 (ref 5–15)
BUN: 13 mg/dL (ref 8–23)
CO2: 23 mmol/L (ref 22–32)
Calcium: 9.6 mg/dL (ref 8.9–10.3)
Chloride: 108 mmol/L (ref 98–111)
Creatinine, Ser: 1.32 mg/dL — ABNORMAL HIGH (ref 0.61–1.24)
GFR, Estimated: 56 mL/min — ABNORMAL LOW (ref >60)
Glucose, Bld: 134 mg/dL — ABNORMAL HIGH (ref 70–99)
Potassium: 3.3 mmol/L — ABNORMAL LOW (ref 3.5–5.1)
Sodium: 138 mmol/L (ref 135–145)

## 2021-10-27 LAB — CBC
HCT: 32.9 % — ABNORMAL LOW (ref 39.0–52.0)
HCT: 34.2 % — ABNORMAL LOW (ref 39.0–52.0)
Hemoglobin: 10.6 g/dL — ABNORMAL LOW (ref 13.0–17.0)
Hemoglobin: 11.2 g/dL — ABNORMAL LOW (ref 13.0–17.0)
MCH: 29 pg (ref 26.0–34.0)
MCH: 29.5 pg (ref 26.0–34.0)
MCHC: 32.2 g/dL (ref 30.0–36.0)
MCHC: 32.7 g/dL (ref 30.0–36.0)
MCV: 89.9 fL (ref 80.0–100.0)
MCV: 90 fL (ref 80.0–100.0)
Platelets: 249 10*3/uL (ref 150–400)
Platelets: 179 10*3/uL (ref 150–400)
RBC: 3.66 MIL/uL — ABNORMAL LOW (ref 4.22–5.81)
RBC: 3.8 MIL/uL — ABNORMAL LOW (ref 4.22–5.81)
RDW: 12.3 % (ref 11.5–15.5)
RDW: 12.2 % (ref 11.5–15.5)
WBC: 5.9 10*3/uL (ref 4.0–10.5)
WBC: 11.2 10*3/uL — ABNORMAL HIGH (ref 4.0–10.5)
nRBC: 0 % (ref 0.0–0.2)
nRBC: 0 % (ref 0.0–0.2)

## 2021-10-27 LAB — URINALYSIS, ROUTINE W REFLEX MICROSCOPIC
Bacteria, UA: NONE SEEN
Bilirubin Urine: NEGATIVE
Glucose, UA: NEGATIVE mg/dL
Hgb urine dipstick: NEGATIVE
Ketones, ur: NEGATIVE mg/dL
Leukocytes,Ua: NEGATIVE
Nitrite: NEGATIVE
Protein, ur: 100 mg/dL — AB
Specific Gravity, Urine: 1.017 (ref 1.005–1.030)
pH: 6 (ref 5.0–8.0)

## 2021-10-27 LAB — PLATELET COUNT: Platelets: 159 10*3/uL (ref 150–400)

## 2021-10-27 LAB — PROTIME-INR
INR: 1.1 (ref 0.8–1.2)
INR: 1.4 — ABNORMAL HIGH (ref 0.8–1.2)
Prothrombin Time: 14 seconds (ref 11.4–15.2)
Prothrombin Time: 17.3 seconds — ABNORMAL HIGH (ref 11.4–15.2)

## 2021-10-27 LAB — GLUCOSE, CAPILLARY
Glucose-Capillary: 125 mg/dL — ABNORMAL HIGH (ref 70–99)
Glucose-Capillary: 126 mg/dL — ABNORMAL HIGH (ref 70–99)
Glucose-Capillary: 127 mg/dL — ABNORMAL HIGH (ref 70–99)

## 2021-10-27 LAB — HEMOGLOBIN AND HEMATOCRIT, BLOOD
HCT: 25.3 % — ABNORMAL LOW (ref 39.0–52.0)
Hemoglobin: 8.3 g/dL — ABNORMAL LOW (ref 13.0–17.0)

## 2021-10-27 LAB — PREPARE RBC (CROSSMATCH)

## 2021-10-27 LAB — HEMOGLOBIN A1C
Hgb A1c MFr Bld: 5.7 % — ABNORMAL HIGH (ref 4.8–5.6)
Mean Plasma Glucose: 116.89 mg/dL

## 2021-10-27 LAB — ECHO INTRAOPERATIVE TEE
Height: 67.5 in
Weight: 2768 oz

## 2021-10-27 IMAGING — DX DG CHEST 1V PORT
1 series · 1 of 1 positions shown · non-contrast
Comparison: Film from earlier in the same day.

CLINICAL DATA: Status post cardiac surgery, initial encounter

EXAM:
PORTABLE CHEST 1 VIEW

[chest]
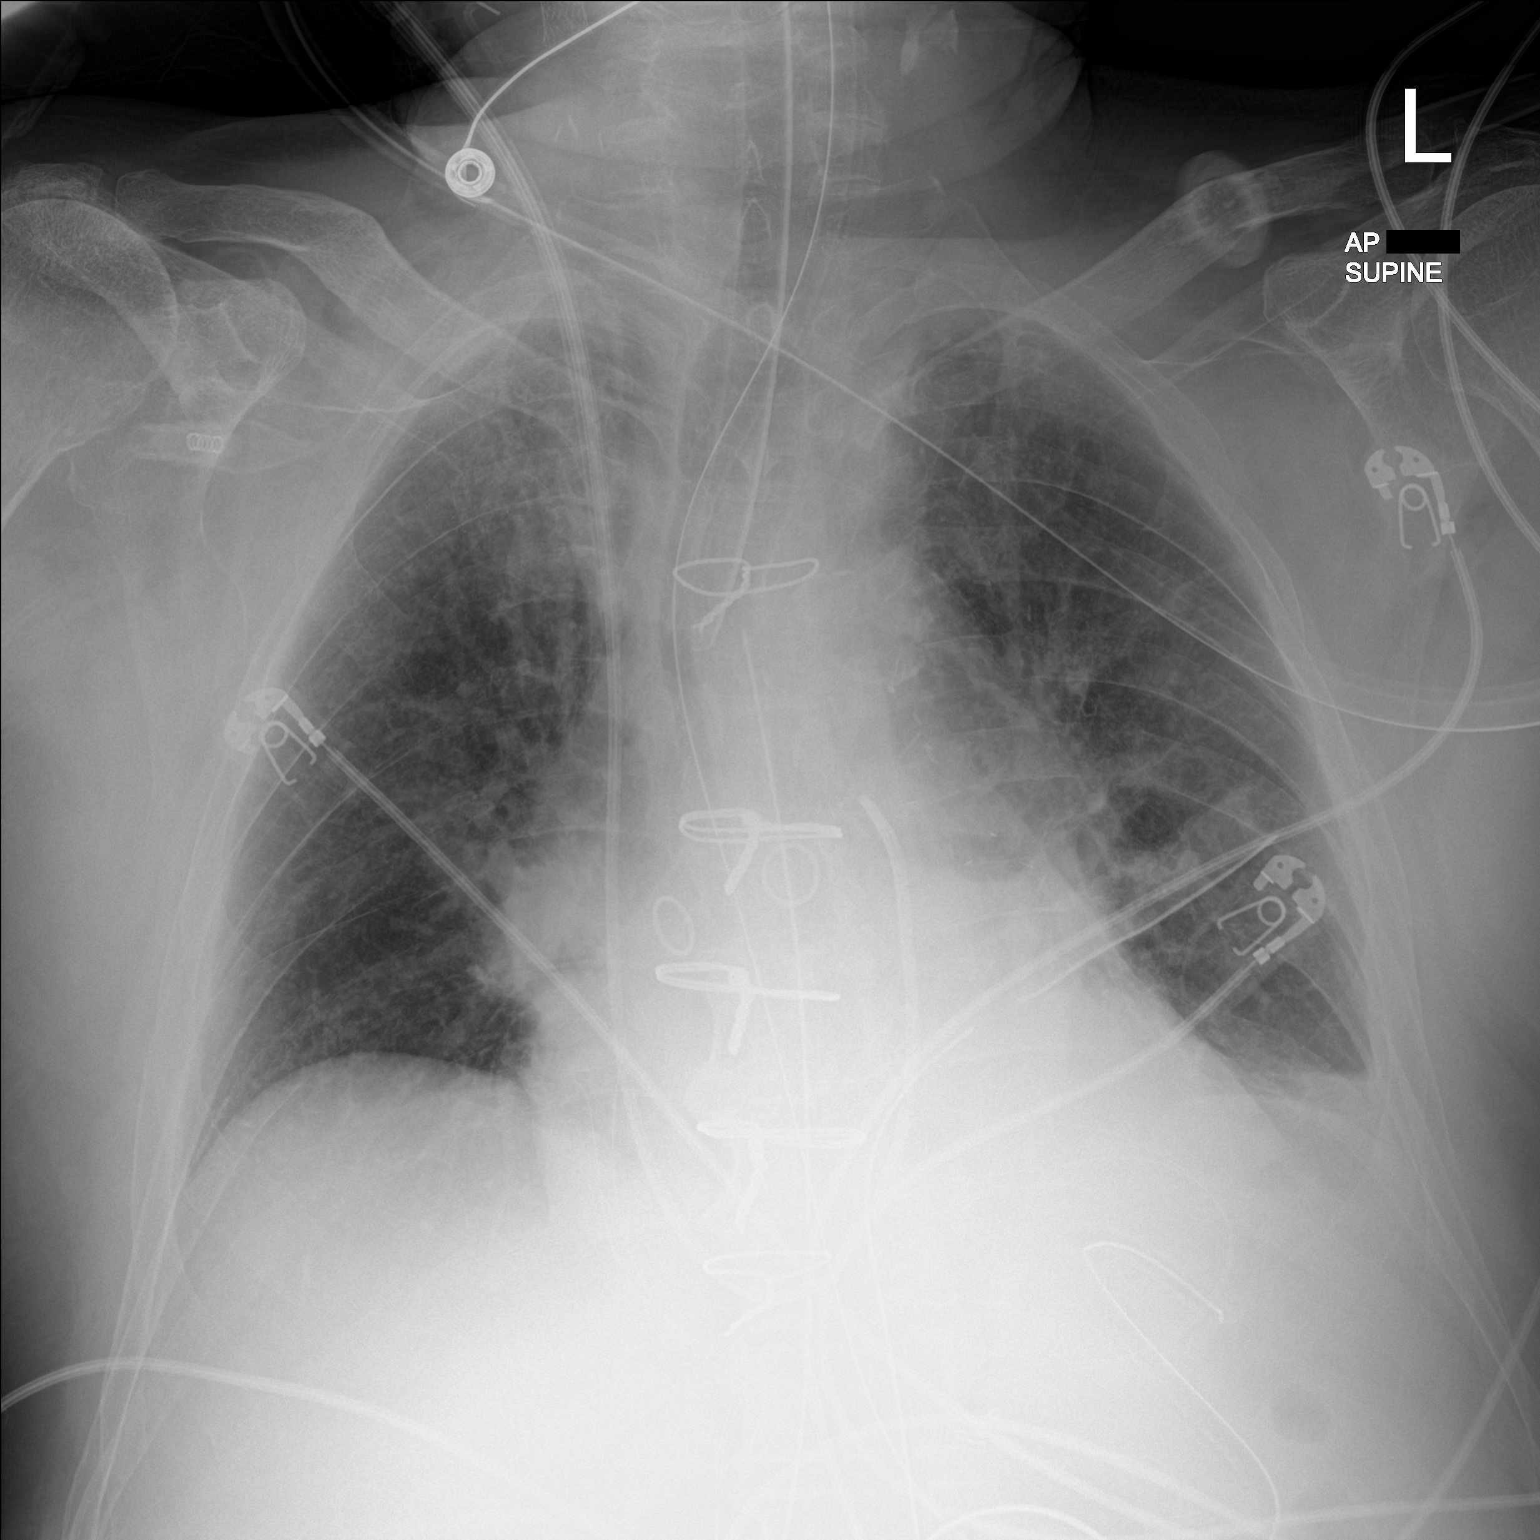

[1 of 1 positions shown; findings below may reference images not displayed]

FINDINGS: Postsurgical changes are now seen. Endotracheal tube and gastric
catheter are noted in satisfactory position. Mediastinal drain and
left thoracostomy catheter are seen. Swan-Ganz catheter is noted in
the pulmonary outflow tract. Small left pleural effusion is noted.
No pneumothorax is seen. No focal infiltrate is noted.
IMPRESSION: Postsurgical changes with tubes and lines in satisfactory position.

Minimal left pleural effusion.

## 2021-10-27 IMAGING — CR DG CHEST 2V
3 series · 3 of 3 positions shown · non-contrast
Comparison: Previous studies including the examination of
[DATE]

CLINICAL DATA: Preop evaluation

EXAM:
CHEST - 2 VIEW

[chest lat (1 of 2)]
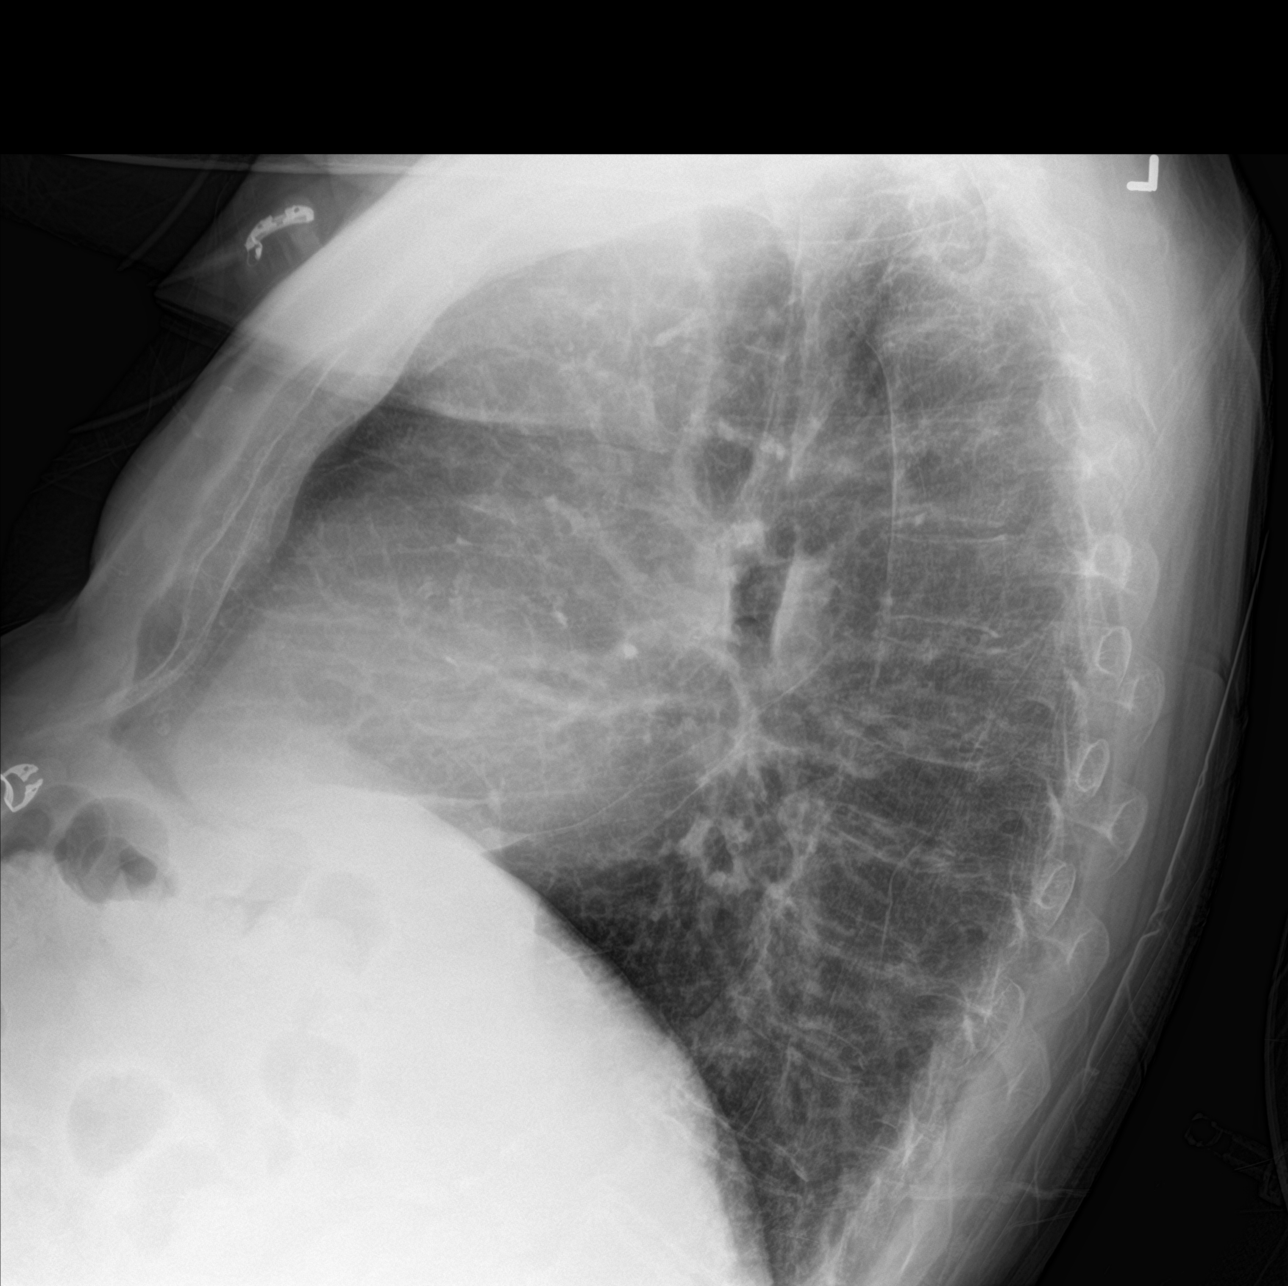

[chest ap]
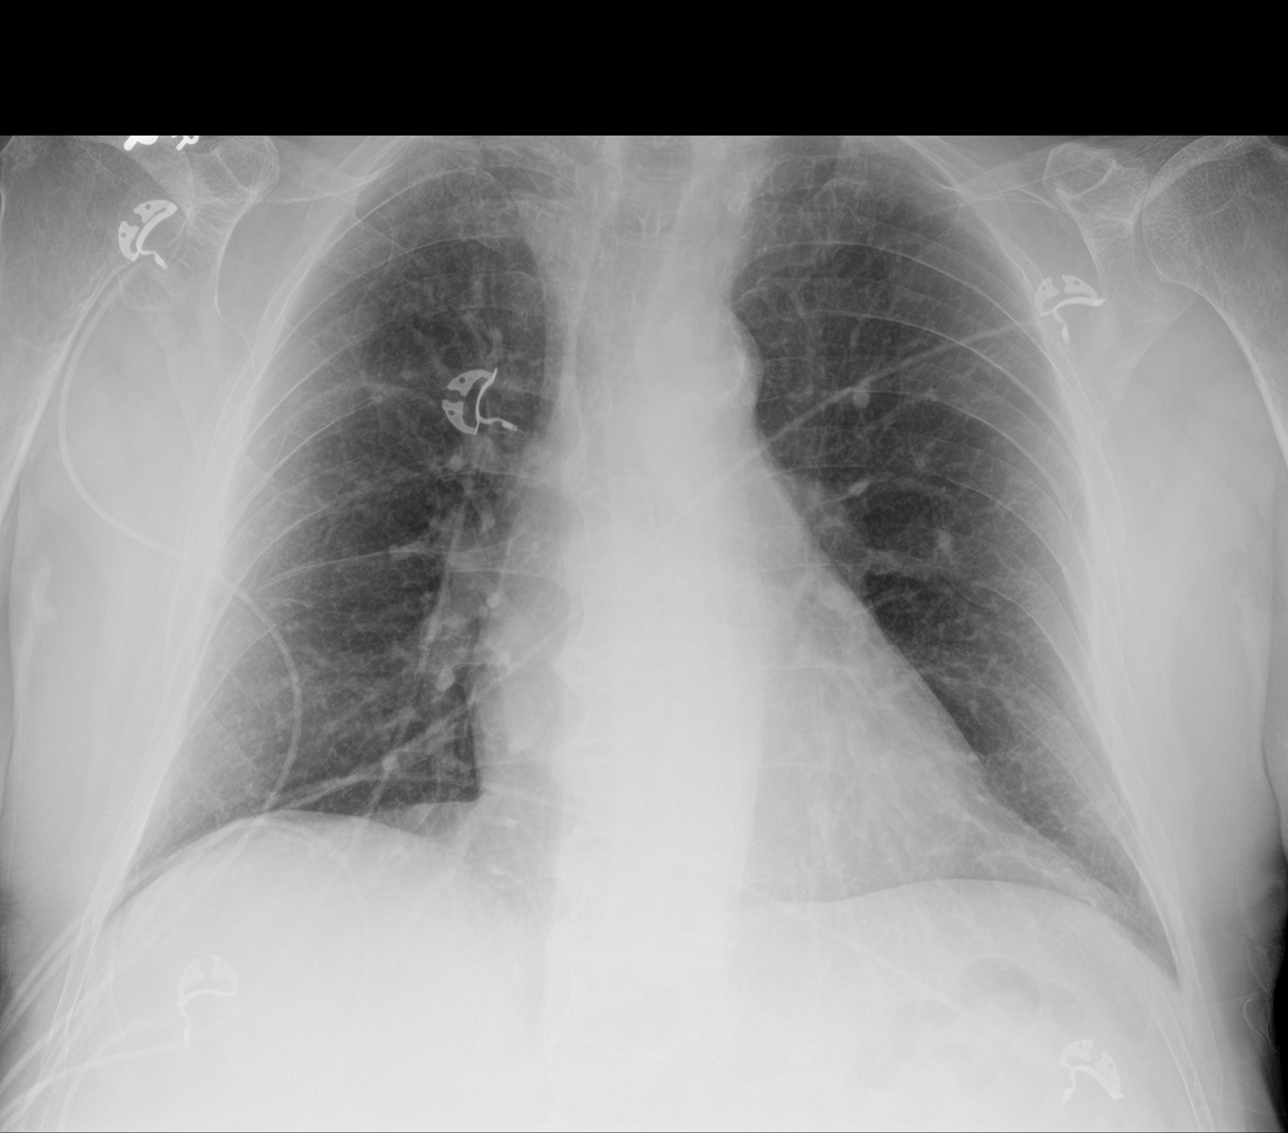

[chest lat (2 of 2)]
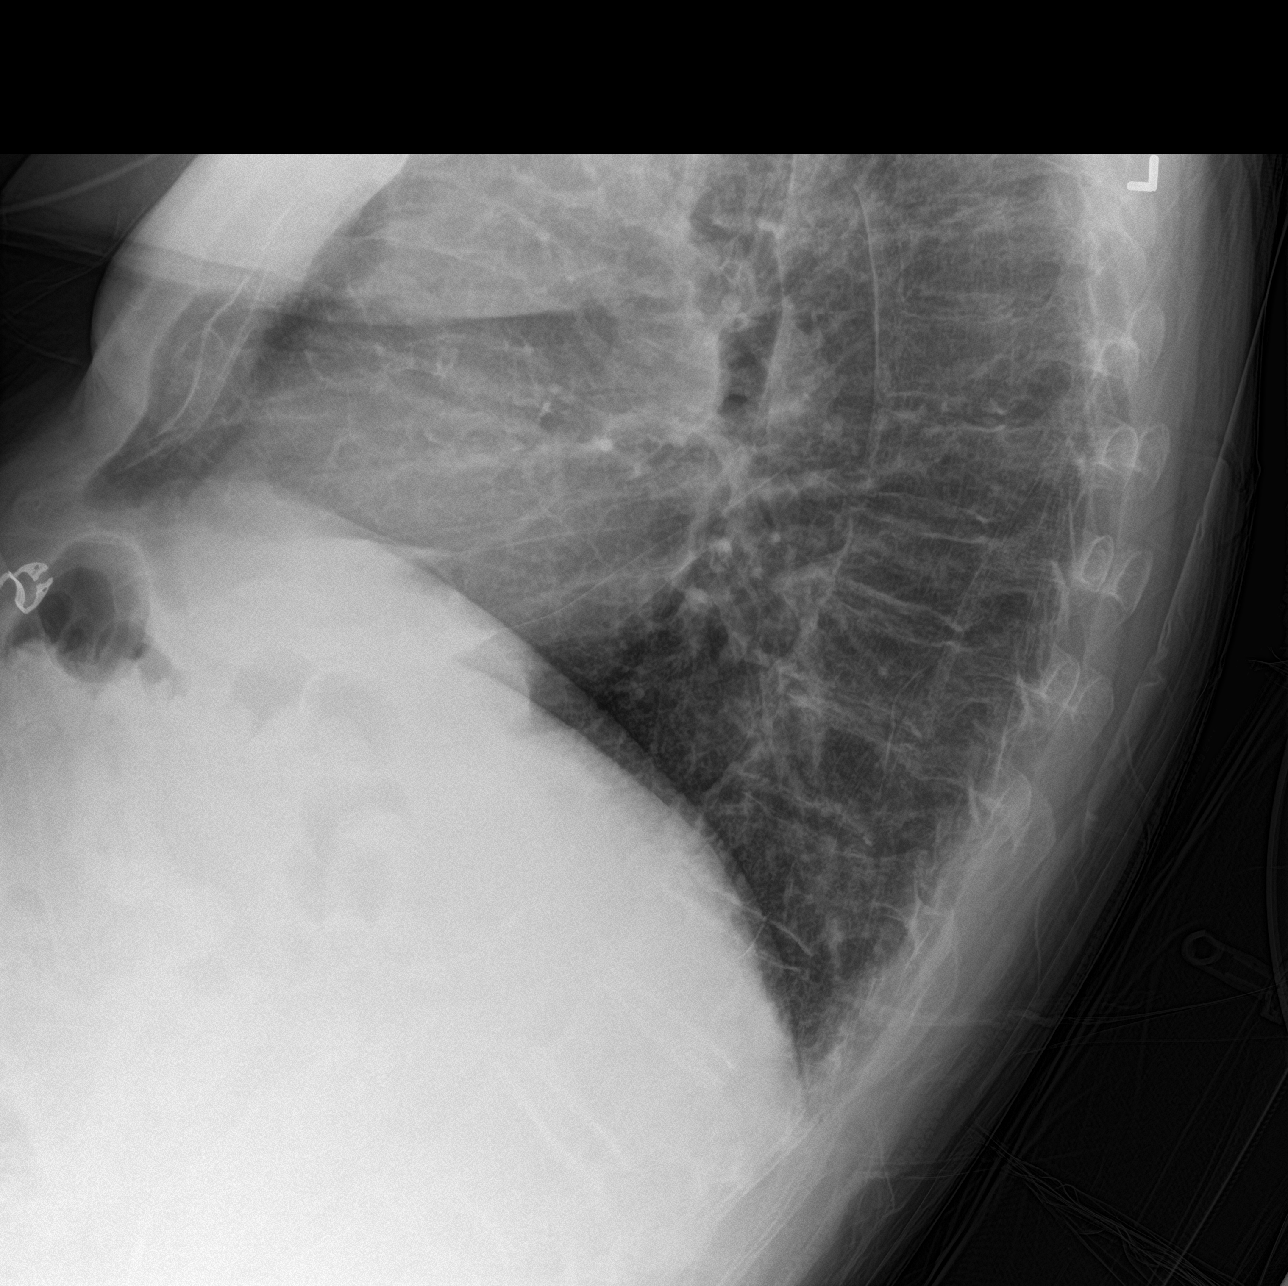

[3 of 3 positions shown; findings below may reference images not displayed]

FINDINGS: Cardiac size is within normal limits. Mediastinum is unremarkable.
There are no signs of pulmonary edema or focal pulmonary
consolidation. There is no pleural effusion or pneumothorax.
Degenerative changes are noted in right shoulder with possible
chronic rotator cuff tear.
IMPRESSION: No active disease is seen in the chest.

## 2021-10-27 SURGERY — CORONARY ARTERY BYPASS GRAFTING (CABG)
Anesthesia: Monitor Anesthesia Care | Site: Chest | Laterality: Right

## 2021-10-27 MED ORDER — SODIUM CHLORIDE (PF) 0.9 % IJ SOLN
INTRAMUSCULAR | Status: AC
  Filled 2021-10-27: qty 10

## 2021-10-27 MED ORDER — MUPIROCIN 2 % EX OINT
1.0000 "application " | TOPICAL_OINTMENT | Freq: Two times a day (BID) | CUTANEOUS | Status: AC
  Administered 2021-10-27 – 2021-10-31 (×10): 1 via NASAL
  Filled 2021-10-27 (×4): qty 22

## 2021-10-27 MED ORDER — HEMOSTATIC AGENTS (NO CHARGE) OPTIME
TOPICAL | Status: DC | PRN
  Administered 2021-10-27: 1 via TOPICAL

## 2021-10-27 MED ORDER — MAGNESIUM SULFATE 4 GM/100ML IV SOLN
4.0000 g | Freq: Once | INTRAVENOUS | Status: AC
  Administered 2021-10-27: 4 g via INTRAVENOUS
  Filled 2021-10-27: qty 100

## 2021-10-27 MED ORDER — LACTATED RINGERS IV SOLN: INTRAVENOUS | Status: DC | PRN

## 2021-10-27 MED ORDER — PLASMA-LYTE A IV SOLN
INTRAVENOUS | Status: DC | PRN
  Administered 2021-10-27: 1000 mL

## 2021-10-27 MED ORDER — INSULIN REGULAR(HUMAN) IN NACL 100-0.9 UT/100ML-% IV SOLN
INTRAVENOUS | Status: DC | PRN
Start: 2021-10-27 — End: 2021-10-27
  Administered 2021-10-27: .6 [IU]/h via INTRAVENOUS

## 2021-10-27 MED ORDER — SODIUM CHLORIDE 0.9 % IV SOLN: 10.0000 mL/h | Freq: Once | INTRAVENOUS | Status: DC

## 2021-10-27 MED ORDER — FAMOTIDINE IN NACL 20-0.9 MG/50ML-% IV SOLN
20.0000 mg | Freq: Two times a day (BID) | INTRAVENOUS | Status: AC
  Administered 2021-10-27 – 2021-10-28 (×2): 20 mg via INTRAVENOUS
  Filled 2021-10-27 (×2): qty 50

## 2021-10-27 MED ORDER — LIDOCAINE 2% (20 MG/ML) 5 ML SYRINGE
INTRAMUSCULAR | Status: AC
  Filled 2021-10-27: qty 5

## 2021-10-27 MED ORDER — ASPIRIN EC 325 MG PO TBEC
325.0000 mg | DELAYED_RELEASE_TABLET | Freq: Every day | ORAL | Status: DC
  Administered 2021-10-29 – 2021-11-02 (×5): 325 mg via ORAL
  Filled 2021-10-27 (×5): qty 1

## 2021-10-27 MED ORDER — ACETAMINOPHEN 500 MG PO TABS
1000.0000 mg | ORAL_TABLET | Freq: Four times a day (QID) | ORAL | Status: AC
  Administered 2021-10-28 – 2021-11-01 (×15): 1000 mg via ORAL
  Filled 2021-10-27 (×13): qty 2

## 2021-10-27 MED ORDER — CHLORHEXIDINE GLUCONATE CLOTH 2 % EX PADS
6.0000 | MEDICATED_PAD | Freq: Every day | CUTANEOUS | Status: DC
  Administered 2021-10-27: 6 via TOPICAL

## 2021-10-27 MED ORDER — HEPARIN SODIUM (PORCINE) 1000 UNIT/ML IJ SOLN
INTRAMUSCULAR | Status: DC | PRN
Start: 2021-10-27 — End: 2021-10-27
  Administered 2021-10-27: 25000 [IU] via INTRAVENOUS
  Administered 2021-10-27: 2000 [IU] via INTRAVENOUS

## 2021-10-27 MED ORDER — ARTIFICIAL TEARS OPHTHALMIC OINT
TOPICAL_OINTMENT | OPHTHALMIC | Status: DC | PRN
  Administered 2021-10-27: 1 via OPHTHALMIC

## 2021-10-27 MED ORDER — POTASSIUM CHLORIDE 10 MEQ/50ML IV SOLN: 10.0000 meq | INTRAVENOUS | Status: AC

## 2021-10-27 MED ORDER — DEXTROSE 50 % IV SOLN: 0.0000 mL | INTRAVENOUS | Status: DC | PRN

## 2021-10-27 MED ORDER — SODIUM CHLORIDE 0.45 % IV SOLN: INTRAVENOUS | Status: DC | PRN

## 2021-10-27 MED ORDER — SODIUM CHLORIDE (PF) 0.9 % IJ SOLN
OROMUCOSAL | Status: DC | PRN
  Administered 2021-10-27: 4 mL via TOPICAL

## 2021-10-27 MED ORDER — FENTANYL CITRATE (PF) 100 MCG/2ML IJ SOLN
INTRAMUSCULAR | Status: DC | PRN
  Administered 2021-10-27 (×2): 100 ug via INTRAVENOUS
  Administered 2021-10-27: 250 ug via INTRAVENOUS
  Administered 2021-10-27 (×5): 100 ug via INTRAVENOUS
  Administered 2021-10-27: 50 ug via INTRAVENOUS
  Administered 2021-10-27: 100 ug via INTRAVENOUS

## 2021-10-27 MED ORDER — ORAL CARE MOUTH RINSE: 15.0000 mL | Freq: Once | OROMUCOSAL | Status: AC

## 2021-10-27 MED ORDER — PHENYLEPHRINE HCL-NACL 20-0.9 MG/250ML-% IV SOLN
0.0000 ug/min | INTRAVENOUS | Status: DC
  Administered 2021-10-27: 10 ug/min via INTRAVENOUS

## 2021-10-27 MED ORDER — DEXMEDETOMIDINE HCL IN NACL 400 MCG/100ML IV SOLN
0.0000 ug/kg/h | INTRAVENOUS | Status: DC
  Administered 2021-10-27: 0.7 ug/kg/h via INTRAVENOUS
  Filled 2021-10-27 (×2): qty 100

## 2021-10-27 MED ORDER — PHENYLEPHRINE 40 MCG/ML (10ML) SYRINGE FOR IV PUSH (FOR BLOOD PRESSURE SUPPORT)
PREFILLED_SYRINGE | INTRAVENOUS | Status: DC | PRN
  Administered 2021-10-27 (×2): 40 ug via INTRAVENOUS

## 2021-10-27 MED ORDER — ACETAMINOPHEN 650 MG RE SUPP
650.0000 mg | Freq: Once | RECTAL | Status: AC
  Administered 2021-10-27: 650 mg via RECTAL

## 2021-10-27 MED ORDER — DOPAMINE-DEXTROSE 3.2-5 MG/ML-% IV SOLN: 0.0000 ug/kg/min | INTRAVENOUS | Status: DC

## 2021-10-27 MED ORDER — INSULIN REGULAR(HUMAN) IN NACL 100-0.9 UT/100ML-% IV SOLN: INTRAVENOUS | Status: DC

## 2021-10-27 MED ORDER — METOPROLOL TARTRATE 12.5 MG HALF TABLET: 12.5000 mg | ORAL_TABLET | Freq: Two times a day (BID) | ORAL | Status: DC

## 2021-10-27 MED ORDER — TRAMADOL HCL 50 MG PO TABS
50.0000 mg | ORAL_TABLET | ORAL | Status: DC | PRN
  Administered 2021-10-28 (×2): 100 mg via ORAL
  Administered 2021-10-29: 50 mg via ORAL
  Filled 2021-10-27: qty 2
  Filled 2021-10-27: qty 1
  Filled 2021-10-27: qty 2

## 2021-10-27 MED ORDER — 0.9 % SODIUM CHLORIDE (POUR BTL) OPTIME
TOPICAL | Status: DC | PRN
  Administered 2021-10-27: 5000 mL

## 2021-10-27 MED ORDER — SODIUM CHLORIDE 0.9% FLUSH
3.0000 mL | Freq: Two times a day (BID) | INTRAVENOUS | Status: DC
  Administered 2021-10-28 – 2021-11-01 (×10): 3 mL via INTRAVENOUS

## 2021-10-27 MED ORDER — DEXMEDETOMIDINE HCL IN NACL 400 MCG/100ML IV SOLN
INTRAVENOUS | Status: DC | PRN
  Administered 2021-10-27: .4 ug/kg/h via INTRAVENOUS

## 2021-10-27 MED ORDER — METOPROLOL TARTRATE 25 MG/10 ML ORAL SUSPENSION
12.5000 mg | Freq: Two times a day (BID) | ORAL | Status: DC
  Filled 2021-10-27: qty 5

## 2021-10-27 MED ORDER — BISACODYL 5 MG PO TBEC
10.0000 mg | DELAYED_RELEASE_TABLET | Freq: Every day | ORAL | Status: DC
  Administered 2021-10-28 – 2021-10-31 (×4): 10 mg via ORAL
  Filled 2021-10-27 (×5): qty 2

## 2021-10-27 MED ORDER — BISACODYL 10 MG RE SUPP: 10.0000 mg | Freq: Every day | RECTAL | Status: DC

## 2021-10-27 MED ORDER — CHLORHEXIDINE GLUCONATE 0.12 % MT SOLN: 15.0000 mL | Freq: Once | OROMUCOSAL | Status: AC

## 2021-10-27 MED ORDER — LACTATED RINGERS IV SOLN: 500.0000 mL | Freq: Once | INTRAVENOUS | Status: DC | PRN

## 2021-10-27 MED ORDER — SODIUM CHLORIDE 0.9 % IV SOLN: INTRAVENOUS | Status: DC | PRN

## 2021-10-27 MED ORDER — LACTATED RINGERS IV SOLN: INTRAVENOUS | Status: DC

## 2021-10-27 MED ORDER — PANTOPRAZOLE SODIUM 40 MG PO TBEC
40.0000 mg | DELAYED_RELEASE_TABLET | Freq: Every day | ORAL | Status: DC
  Administered 2021-10-29 – 2021-11-03 (×6): 40 mg via ORAL
  Filled 2021-10-27 (×6): qty 1

## 2021-10-27 MED ORDER — METOCLOPRAMIDE HCL 5 MG/ML IJ SOLN
10.0000 mg | Freq: Four times a day (QID) | INTRAMUSCULAR | Status: AC
  Administered 2021-10-27 – 2021-10-28 (×4): 10 mg via INTRAVENOUS
  Filled 2021-10-27 (×4): qty 2

## 2021-10-27 MED ORDER — MIDAZOLAM HCL 2 MG/2ML IJ SOLN: 2.0000 mg | INTRAMUSCULAR | Status: DC | PRN

## 2021-10-27 MED ORDER — OXYCODONE HCL 5 MG PO TABS
5.0000 mg | ORAL_TABLET | ORAL | Status: DC | PRN
  Administered 2021-10-28: 16:00:00 5 mg via ORAL
  Filled 2021-10-27: qty 1

## 2021-10-27 MED ORDER — FENTANYL CITRATE (PF) 250 MCG/5ML IJ SOLN
INTRAMUSCULAR | Status: AC
  Filled 2021-10-27: qty 5

## 2021-10-27 MED ORDER — MIDAZOLAM HCL (PF) 10 MG/2ML IJ SOLN
INTRAMUSCULAR | Status: AC
  Filled 2021-10-27: qty 2

## 2021-10-27 MED ORDER — ASPIRIN 81 MG PO CHEW
324.0000 mg | CHEWABLE_TABLET | Freq: Every day | ORAL | Status: DC
  Administered 2021-10-28: 324 mg
  Filled 2021-10-27: qty 4

## 2021-10-27 MED ORDER — VANCOMYCIN HCL IN DEXTROSE 1-5 GM/200ML-% IV SOLN
1000.0000 mg | Freq: Once | INTRAVENOUS | Status: AC
  Administered 2021-10-28: 1000 mg via INTRAVENOUS
  Filled 2021-10-27: qty 200

## 2021-10-27 MED ORDER — ACETAMINOPHEN 160 MG/5ML PO SOLN: 650.0000 mg | Freq: Once | ORAL | Status: AC

## 2021-10-27 MED ORDER — ONDANSETRON HCL 4 MG/2ML IJ SOLN
4.0000 mg | Freq: Four times a day (QID) | INTRAMUSCULAR | Status: DC | PRN
  Administered 2021-10-28 – 2021-10-31 (×2): 4 mg via INTRAVENOUS
  Filled 2021-10-27: qty 2

## 2021-10-27 MED ORDER — NITROGLYCERIN IN D5W 200-5 MCG/ML-% IV SOLN
0.0000 ug/min | INTRAVENOUS | Status: DC
  Administered 2021-10-28: 20 ug/min via INTRAVENOUS

## 2021-10-27 MED ORDER — PROPOFOL 10 MG/ML IV BOLUS
INTRAVENOUS | Status: AC
  Filled 2021-10-27: qty 20

## 2021-10-27 MED ORDER — MORPHINE SULFATE (PF) 2 MG/ML IV SOLN
1.0000 mg | INTRAVENOUS | Status: DC | PRN
  Administered 2021-10-28 (×3): 2 mg via INTRAVENOUS
  Filled 2021-10-27 (×3): qty 1

## 2021-10-27 MED ORDER — DOPAMINE-DEXTROSE 3.2-5 MG/ML-% IV SOLN
INTRAVENOUS | Status: DC | PRN
  Administered 2021-10-27: 3 ug/kg/min via INTRAVENOUS

## 2021-10-27 MED ORDER — CHLORHEXIDINE GLUCONATE 0.12 % MT SOLN
15.0000 mL | OROMUCOSAL | Status: AC
  Administered 2021-10-27: 15 mL via OROMUCOSAL

## 2021-10-27 MED ORDER — SODIUM CHLORIDE 0.9 % IV SOLN: INTRAVENOUS | Status: DC

## 2021-10-27 MED ORDER — ALBUMIN HUMAN 5 % IV SOLN
250.0000 mL | INTRAVENOUS | Status: AC | PRN
  Administered 2021-10-27 (×2): 12.5 g via INTRAVENOUS

## 2021-10-27 MED ORDER — ROCURONIUM BROMIDE 10 MG/ML (PF) SYRINGE
PREFILLED_SYRINGE | INTRAVENOUS | Status: DC | PRN
  Administered 2021-10-27: 50 mg via INTRAVENOUS
  Administered 2021-10-27: 100 mg via INTRAVENOUS
  Administered 2021-10-27: 50 mg via INTRAVENOUS

## 2021-10-27 MED ORDER — CEFAZOLIN SODIUM-DEXTROSE 2-4 GM/100ML-% IV SOLN
2.0000 g | Freq: Three times a day (TID) | INTRAVENOUS | Status: AC
  Administered 2021-10-28 – 2021-10-29 (×5): 2 g via INTRAVENOUS
  Filled 2021-10-27 (×4): qty 100

## 2021-10-27 MED ORDER — PROTAMINE SULFATE 10 MG/ML IV SOLN
INTRAVENOUS | Status: DC | PRN
  Administered 2021-10-27: 270 mg via INTRAVENOUS

## 2021-10-27 MED ORDER — PROPOFOL 10 MG/ML IV BOLUS
INTRAVENOUS | Status: DC | PRN
  Administered 2021-10-27: 30 mg via INTRAVENOUS

## 2021-10-27 MED ORDER — SODIUM CHLORIDE 0.9 % IV SOLN: 250.0000 mL | INTRAVENOUS | Status: DC

## 2021-10-27 MED ORDER — MIDAZOLAM HCL (PF) 5 MG/ML IJ SOLN
INTRAMUSCULAR | Status: DC | PRN
  Administered 2021-10-27: 2 mg via INTRAVENOUS
  Administered 2021-10-27: 1 mg via INTRAVENOUS
  Administered 2021-10-27: 4 mg via INTRAVENOUS

## 2021-10-27 MED ORDER — FENTANYL CITRATE (PF) 250 MCG/5ML IJ SOLN
INTRAMUSCULAR | Status: AC
  Filled 2021-10-27: qty 20

## 2021-10-27 MED ORDER — CHLORHEXIDINE GLUCONATE 0.12 % MT SOLN
OROMUCOSAL | Status: AC
  Administered 2021-10-27: 15 mL via OROMUCOSAL
  Filled 2021-10-27: qty 15

## 2021-10-27 MED ORDER — ALBUTEROL SULFATE (2.5 MG/3ML) 0.083% IN NEBU
INHALATION_SOLUTION | RESPIRATORY_TRACT | Status: AC
  Administered 2021-10-27: 2.5 mg
  Filled 2021-10-27: qty 3

## 2021-10-27 MED ORDER — ACETAMINOPHEN 160 MG/5ML PO SOLN
1000.0000 mg | Freq: Four times a day (QID) | ORAL | Status: AC
  Administered 2021-10-28: 05:00:00 1000 mg
  Filled 2021-10-27: qty 40.6

## 2021-10-27 MED ORDER — SODIUM CHLORIDE 0.9% FLUSH: 3.0000 mL | INTRAVENOUS | Status: DC | PRN

## 2021-10-27 MED ORDER — METOPROLOL TARTRATE 5 MG/5ML IV SOLN
2.5000 mg | INTRAVENOUS | Status: DC | PRN
  Administered 2021-10-31: 2.5 mg via INTRAVENOUS
  Filled 2021-10-27: qty 5

## 2021-10-27 MED ORDER — DOCUSATE SODIUM 100 MG PO CAPS
200.0000 mg | ORAL_CAPSULE | Freq: Every day | ORAL | Status: DC
  Administered 2021-10-28 – 2021-10-31 (×4): 200 mg via ORAL
  Filled 2021-10-27 (×5): qty 2

## 2021-10-27 MED ORDER — ROCURONIUM BROMIDE 10 MG/ML (PF) SYRINGE
PREFILLED_SYRINGE | INTRAVENOUS | Status: AC
  Filled 2021-10-27: qty 10

## 2021-10-27 MED ORDER — CHLORHEXIDINE GLUCONATE CLOTH 2 % EX PADS
6.0000 | MEDICATED_PAD | Freq: Every day | CUTANEOUS | Status: DC
  Administered 2021-10-27 – 2021-11-03 (×7): 6 via TOPICAL

## 2021-10-27 SURGICAL SUPPLY — 100 items
ADH SKN CLS APL DERMABOND .7 (GAUZE/BANDAGES/DRESSINGS) ×8
BAG DECANTER FOR FLEXI CONT (MISCELLANEOUS) ×5 IMPLANT
BLADE CLIPPER SURG (BLADE) ×5 IMPLANT
BLADE STERNUM SYSTEM 6 (BLADE) ×5 IMPLANT
BLADE SURG 11 STRL SS (BLADE) ×1 IMPLANT
BLADE SURG 15 STRL LF DISP TIS (BLADE) IMPLANT
BLADE SURG 15 STRL SS (BLADE) ×5
BNDG ELASTIC 4X5.8 VLCR STR LF (GAUZE/BANDAGES/DRESSINGS) ×5 IMPLANT
BNDG ELASTIC 6X5.8 VLCR STR LF (GAUZE/BANDAGES/DRESSINGS) ×5 IMPLANT
BNDG GAUZE ELAST 4 BULKY (GAUZE/BANDAGES/DRESSINGS) ×5 IMPLANT
CANISTER SUCT 3000ML PPV (MISCELLANEOUS) ×5 IMPLANT
CANNULA EZ GLIDE AORTIC 21FR (CANNULA) ×5 IMPLANT
CATH CPB KIT HENDRICKSON (MISCELLANEOUS) ×5 IMPLANT
CATH ROBINSON RED A/P 18FR (CATHETERS) ×5 IMPLANT
CATH THORACIC 36FR (CATHETERS) ×5 IMPLANT
CATH THORACIC 36FR RT ANG (CATHETERS) ×5 IMPLANT
CLIP TI WIDE RED SMALL 24 (CLIP) ×1 IMPLANT
CLIP VESOCCLUDE MED 24/CT (CLIP) IMPLANT
CLIP VESOCCLUDE SM WIDE 24/CT (CLIP) IMPLANT
CONN ST 1/2X1/2  BEN (MISCELLANEOUS) ×5
CONN ST 1/2X1/2 BEN (MISCELLANEOUS) IMPLANT
CONTAINER PROTECT SURGISLUSH (MISCELLANEOUS) ×10 IMPLANT
DEFOGGER ANTIFOG KIT (MISCELLANEOUS) ×1 IMPLANT
DERMABOND ADVANCED (GAUZE/BANDAGES/DRESSINGS) ×2
DERMABOND ADVANCED .7 DNX12 (GAUZE/BANDAGES/DRESSINGS) IMPLANT
DRAPE CARDIOVASCULAR INCISE (DRAPES) ×5
DRAPE SRG 135X102X78XABS (DRAPES) ×4 IMPLANT
DRAPE WARM FLUID 44X44 (DRAPES) ×5 IMPLANT
DRSG COVADERM 4X14 (GAUZE/BANDAGES/DRESSINGS) ×5 IMPLANT
ELECT REM PT RETURN 9FT ADLT (ELECTROSURGICAL) ×10
ELECTRODE REM PT RTRN 9FT ADLT (ELECTROSURGICAL) ×8 IMPLANT
FELT TEFLON 1X6 (MISCELLANEOUS) ×9 IMPLANT
GAUZE SPONGE 4X4 12PLY STRL (GAUZE/BANDAGES/DRESSINGS) ×10 IMPLANT
GAUZE SPONGE 4X4 12PLY STRL LF (GAUZE/BANDAGES/DRESSINGS) ×2 IMPLANT
GLOVE SURG MICRO LTX SZ6 (GLOVE) ×6 IMPLANT
GLOVE SURG MICRO LTX SZ7 (GLOVE) ×2 IMPLANT
GLOVE SURG MICRO LTX SZ7.5 (GLOVE) ×3 IMPLANT
GLOVE SURG SIGNA 7.5 PF LTX (GLOVE) ×15 IMPLANT
GOWN STRL REUS W/ TWL LRG LVL3 (GOWN DISPOSABLE) ×16 IMPLANT
GOWN STRL REUS W/ TWL XL LVL3 (GOWN DISPOSABLE) ×8 IMPLANT
GOWN STRL REUS W/TWL LRG LVL3 (GOWN DISPOSABLE) ×20
GOWN STRL REUS W/TWL XL LVL3 (GOWN DISPOSABLE) ×10
HEMOSTAT POWDER SURGIFOAM 1G (HEMOSTASIS) ×15 IMPLANT
HEMOSTAT SURGICEL 2X14 (HEMOSTASIS) ×5 IMPLANT
INSERT FOGARTY XLG (MISCELLANEOUS) IMPLANT
KIT BASIN OR (CUSTOM PROCEDURE TRAY) ×5 IMPLANT
KIT SUCTION CATH 14FR (SUCTIONS) ×10 IMPLANT
KIT TURNOVER KIT B (KITS) ×5 IMPLANT
KIT VASOVIEW HEMOPRO 2 VH 4000 (KITS) ×5 IMPLANT
LOOP VESSEL SUPERMAXI WHITE (MISCELLANEOUS) ×1 IMPLANT
MARKER GRAFT CORONARY BYPASS (MISCELLANEOUS) ×15 IMPLANT
NS IRRIG 1000ML POUR BTL (IV SOLUTION) ×25 IMPLANT
PACK E OPEN HEART (SUTURE) ×5 IMPLANT
PACK OPEN HEART (CUSTOM PROCEDURE TRAY) ×5 IMPLANT
PAD ARMBOARD 7.5X6 YLW CONV (MISCELLANEOUS) ×10 IMPLANT
PAD ELECT DEFIB RADIOL ZOLL (MISCELLANEOUS) ×5 IMPLANT
PENCIL BUTTON HOLSTER BLD 10FT (ELECTRODE) ×5 IMPLANT
POSITIONER HEAD DONUT 9IN (MISCELLANEOUS) ×5 IMPLANT
PUNCH AORTIC ROTATE 4.0MM (MISCELLANEOUS) ×1 IMPLANT
PUNCH AORTIC ROTATE 4.5MM 8IN (MISCELLANEOUS) IMPLANT
PUNCH AORTIC ROTATE 5MM 8IN (MISCELLANEOUS) IMPLANT
SENSOR MYOCARDIAL TEMP (MISCELLANEOUS) ×1 IMPLANT
SET MPS 3-ND DEL (MISCELLANEOUS) ×1 IMPLANT
SOL PREP POV-IOD 4OZ 10% (MISCELLANEOUS) ×2 IMPLANT
SOL PREP PROV IODINE SCRUB 4OZ (MISCELLANEOUS) ×2 IMPLANT
SUPPORT HEART JANKE-BARRON (MISCELLANEOUS) ×5 IMPLANT
SUT BONE WAX W31G (SUTURE) ×5 IMPLANT
SUT ETHIBOND 2 0 SH (SUTURE) ×15
SUT ETHIBOND 2 0 SH 36X2 (SUTURE) IMPLANT
SUT MNCRL AB 4-0 PS2 18 (SUTURE) ×1 IMPLANT
SUT PROLENE 3 0 SH DA (SUTURE) ×5 IMPLANT
SUT PROLENE 4 0 RB 1 (SUTURE) ×15
SUT PROLENE 4 0 SH DA (SUTURE) IMPLANT
SUT PROLENE 4-0 RB1 .5 CRCL 36 (SUTURE) IMPLANT
SUT PROLENE 6 0 C 1 30 (SUTURE) ×10 IMPLANT
SUT PROLENE 7 0 BV 1 (SUTURE) ×2 IMPLANT
SUT PROLENE 7 0 BV1 MDA (SUTURE) ×5 IMPLANT
SUT PROLENE 8 0 BV175 6 (SUTURE) ×1 IMPLANT
SUT STEEL 6MS V (SUTURE) ×5 IMPLANT
SUT STEEL STERNAL CCS#1 18IN (SUTURE) IMPLANT
SUT STEEL SZ 6 DBL 3X14 BALL (SUTURE) ×5 IMPLANT
SUT VIC AB 1 CTX 36 (SUTURE) ×10
SUT VIC AB 1 CTX36XBRD ANBCTR (SUTURE) ×8 IMPLANT
SUT VIC AB 2-0 CT1 27 (SUTURE) ×5
SUT VIC AB 2-0 CT1 TAPERPNT 27 (SUTURE) IMPLANT
SUT VIC AB 2-0 CTX 27 (SUTURE) IMPLANT
SUT VIC AB 3-0 SH 27 (SUTURE)
SUT VIC AB 3-0 SH 27X BRD (SUTURE) IMPLANT
SUT VIC AB 3-0 X1 27 (SUTURE) IMPLANT
SUT VICRYL 4-0 PS2 18IN ABS (SUTURE) IMPLANT
SYSTEM SAHARA CHEST DRAIN ATS (WOUND CARE) ×5 IMPLANT
TAPE CLOTH SURG 4X10 WHT LF (GAUZE/BANDAGES/DRESSINGS) ×2 IMPLANT
TAPE PAPER 2X10 WHT MICROPORE (GAUZE/BANDAGES/DRESSINGS) ×1 IMPLANT
TOWEL GREEN STERILE (TOWEL DISPOSABLE) ×5 IMPLANT
TOWEL GREEN STERILE FF (TOWEL DISPOSABLE) ×5 IMPLANT
TRAY FOLEY SLVR 16FR TEMP STAT (SET/KITS/TRAYS/PACK) ×5 IMPLANT
TUBE SUCT INTRACARD DLP 20F (MISCELLANEOUS) ×1 IMPLANT
TUBING LAP HI FLOW INSUFFLATIO (TUBING) ×5 IMPLANT
UNDERPAD 30X36 HEAVY ABSORB (UNDERPADS AND DIAPERS) ×5 IMPLANT
WATER STERILE IRR 1000ML POUR (IV SOLUTION) ×10 IMPLANT

## 2021-10-27 NOTE — Progress Notes (Signed)
Progress Note  Patient Name: Joshua Wyatt Date of Encounter: 10/27/2021  [facility] Cardiologist: [name] MD  Subjective   Feels well this am. Mild chest pain last night.   Inpatient Medications    Scheduled Meds:  amLODipine  5 mg Oral Daily   aspirin  81 mg Oral Daily   Chlorhexidine Gluconate Cloth  6 each Topical Q0600   diazepam  5 mg Oral Once   epinephrine  0-10 mcg/min Intravenous To OR   ezetimibe  10 mg Oral Daily   heparin-papaverine-plasmalyte irrigation   Irrigation To OR   insulin   Intravenous To OR   irbesartan  150 mg Oral Daily   magnesium sulfate  40 mEq Other To OR   mupirocin ointment  1 application Nasal BID   pantoprazole  40 mg Oral Daily   phenylephrine  30-200 mcg/min Intravenous To OR   potassium chloride  80 mEq Other To OR   sodium chloride flush  3 mL Intravenous Q12H   tamsulosin  0.4 mg Oral QHS   tranexamic acid  15 mg/kg Intravenous To OR   tranexamic acid  2 mg/kg Intracatheter To OR   Continuous Infusions:  sodium chloride      ceFAZolin (ANCEF) IV      ceFAZolin (ANCEF) IV     dexmedetomidine     heparin 30,000 units/NS 1000 mL solution for CELLSAVER     milrinone     nitroGLYCERIN     norepinephrine     tranexamic acid (CYKLOKAPRON) infusion ([facility])     vancomycin     PRN Meds: sodium chloride, acetaminophen, morphine injection, ondansetron (ZOFRAN) IV, sodium chloride flush, temazepam   Vital Signs    Vitals:   10/26/21 1540 10/26/21 1600 10/26/21 2023 10/27/21 0448  BP:  (!) 143/81 140/74 (!) 172/93  Pulse: (!) 52 88 85 97  Resp: $Remo'17 18  17  'UKICy$ Temp:  97.7 F (36.5 C) 97.8 F (36.6 C) 97.7 F (36.5 C)  TempSrc:  Oral Oral Oral  SpO2: 97% 99% 96% 97%  Weight:      Height:  5' 7.5" (1.715 m)      Intake/Output Summary (Last 24 hours) at 10/27/2021 1039 Last data filed at 10/26/2021 1854 Gross per 24 hour  Intake 2007.66 ml  Output 700 ml  Net 1307.66 ml   Last 3 Weights 10/26/2021 10/24/2021  08/29/2021  Weight (lbs) 173 lb 173 lb 174 lb  Weight (kg) 78.472 kg 78.472 kg 78.926 kg      Telemetry    NSR rare PVC - Personally Reviewed  ECG    None today - Personally Reviewed  Physical Exam   GEN: No acute distress.   Neck: No JVD Cardiac: RRR, 2/6 SEM RUSB, S2 intact. No  rubs or gallops.  Respiratory: Clear to auscultation bilaterally. GI: Soft, nontender, non-distended  MS: No edema; No deformity. Neuro:  Nonfocal  Psych: Normal affect   Labs    High Sensitivity Troponin:  No results for input(s): TROPONINIHS in the last 720 hours.   Chemistry Recent Labs  Lab 10/24/21 1504 10/27/21 0117  NA 138 138  K 3.7 3.3*  CL 101 108  CO2 20 23  GLUCOSE 144* 134*  BUN 13 13  CREATININE 1.35* 1.32*  CALCIUM 9.7 9.6  GFRNONAA  --  56*  ANIONGAP  --  7    Lipids No results for input(s): CHOL, TRIG, HDL, LABVLDL, LDLCALC, CHOLHDL in the last 168 hours.  Hematology Recent  Labs  Lab 10/24/21 1504 10/27/21 0117  WBC 7.2 5.9  RBC 3.81* 3.66*  HGB 10.7* 10.6*  HCT 33.5* 32.9*  MCV 88 89.9  MCH 28.1 29.0  MCHC 31.9 32.2  RDW 12.3 12.3  PLT 281 249   Thyroid No results for input(s): TSH, FREET4 in the last 168 hours.  BNPNo results for input(s): BNP, PROBNP in the last 168 hours.  DDimer No results for input(s): DDIMER in the last 168 hours.   Radiology    DG Chest 2 View  Result Date: 10/27/2021 CLINICAL DATA:  Preop evaluation EXAM: CHEST - 2 VIEW COMPARISON:  Previous studies including the examination of 05/07/2019 FINDINGS: Cardiac size is within normal limits. Mediastinum is unremarkable. There are no signs of pulmonary edema or focal pulmonary consolidation. There is no pleural effusion or pneumothorax. Degenerative changes are noted in right shoulder with possible chronic rotator cuff tear. IMPRESSION: No active disease is seen in the chest. Electronically Signed   By: Elmer Picker M.D.   On: 10/27/2021 08:18   CARDIAC CATHETERIZATION  Result  Date: 10/26/2021 Images from the original result were not included.   Ost RCA lesion is 80% stenosed.   Prox RCA to Mid RCA lesion is 40% stenosed.   Mid LM to Dist LM lesion is 90% stenosed.   Ost LAD to Mid LAD lesion is 50% stenosed.   Previously placed Ost RCA to Prox RCA stent (unknown type) is  widely patent. KEY CEN is a 75 y.o. male  [id number] LOCATION:  FACILITY: [facility] PHYSICIAN: [name], M.D. [birth year] DATE OF PROCEDURE:  10/26/2021 DATE OF DISCHARGE: CARDIAC CATHETERIZATION History obtained from chart review.Joshua Wyatt is a 75 y.o.  thin appearing widowed Caucasian male with no children who I last saw in the office 08/08/2021.  Unfortunately, his wife of 24 years died at the age of 15 on Nov 04, 2020.  He is currently living alone.  He has a history of PVOD status post bilateral carotid endarterectomies performed by Dr. Drucie Opitz in 2007. We have been following him by duplex ultrasound which performed most recently several days ago revealed this to be widely patent. He has also had bilateral SFA intervention as well as right renal artery stenting. His other problems include hypertension and hyperlipidemia. He did have an episode of chest heaviness recently, but more importantly he has complained of progressive lifestyle limiting claudication since I last saw him. Most recent lower extremity Dopplers reveal a right ABI of 0.93 and a left of 0.82. He does appear to have moderate iliac disease as well as high grade right SFA and popliteal stenosis. He underwent abdominal aortography with bifemoral runoff on 03/30/13 revealed a widely patent right renal artery stent, 50-60% proximal left renal artery stenosis. The left SFA was while he stayed patent and he had a 95% focal above-the-knee popliteal artery stenosis with three-vessel runoff. There was a 60% focal lesion in the right common iliac artery and a 50% segmental proximal to mid right SFA stenosis with a patent stent in the mid to distal  right SFA. He also had a 50% right popliteal stenosis with three-vessel runoff. He underwent cutting balloon angioplasty of the left above-the-knee popliteal artery and Angiosculpt balloon with an excellent angiographic result and was discharged the following day. I performed angiography on him 10/11/14 and recanalized a subtotally occluded right SFA with overlapping via Bahn covered stent performed intervention on his above-the-knee popliteal artery on that side. He had a patent mid to distal  left SFA stent with high grade segmental and 6 sequential disease in the proximal SFA and distal SFA on the left. Since I saw him a year and a half ago he's had progressive lifestyle limiting claudication in his left calf. Addition, he's had severe progression of disease in his right internal carotid artery. I performed carotid stenting on him 01/03/17 with an excellent result. He underwent left lower extremity angiography by myself 02/03/17 revealing an occluded distal left SFA stent with otherwise high-grade segmental disease proximal to this and occluded anterior tibial. I performed cutting balloon atherectomy of his in-stent restenosis and the surrounding disease excellent angiographic result. His claudication has completely resolved. His left ABI has improved from 0.59 up to 1.  Because of recurrent claudication I re-angiogram him 05/11/2018 revealing a patent mid left SFA stent with high-grade segmental proximal left SFA stenosis which I performed Hawk 1 directional atherectomy followed by drug-eluting balloon angioplasty.  Claudication resolved and his Dopplers have normalized.  He did have RCA intervention by myself 03/13/2018 with a drug-eluting stent.  He currently denies chest pain, shortness of breath or claudication.  He had elective right left total knee replacement by Dr. Darrelyn Hillock in June of this year which was uncomplicated.  Since I saw him in the office 2-1/2 months ago he was doing well until 2 to 3 weeks ago when  he started noticing exertional substernal chest burning similar to his prior stent symptoms.  He has taken several neck nitroglycerin with some relief.  He presents now for outpatient diagnostic coronary angiography to define his anatomy.   Mr. Moyano'  previously placed RCA stent is patent although he appears to have an 80% ostial stenosis with damping using a 5 French catheter.  He also has "a napkin ring" 90% distal left main lesion and 40 to 50% segmental proximal LAD stenosis.  He had runs of ventricular tachycardia with contrast injection into the left main.  I believe the best revascularization strategy would be CABG.  Femoral closure was not performed because of a ostial right SFA stent.  The sheath will be removed and pressure held.  The patient left lab in stable condition.  T CTS has been consulted. Nanetta Batty. MD, [facility] 10/26/2021 8:32 AM    ECHOCARDIOGRAM COMPLETE  Result Date: 10/27/2021    ECHOCARDIOGRAM REPORT   Patient Name:   Joshua Wyatt Date of Exam: 10/27/2021 Medical Rec #:  [id number]     Height:       67.5 in Accession #:    [id number]    Weight:       173.0 lb Date of Birth:  [birth year]    BSA:          1.912 m Patient Age:    74 years      BP:           172/93 mmHg Patient Gender: M             HR:           59 bpm. Exam Location:  Inpatient Procedure: 2D Echo, Color Doppler and Cardiac Doppler Indications:    CAD Native Vessel i25.10  History:        Patient has prior history of Echocardiogram examinations, most                 recent 08/25/2021. CAD; Risk Factors:Hypertension and                 Dyslipidemia.  Sonographer:  Raquel Sarna Senior RDCS Referring Phys: [place]  1. Left ventricular ejection fraction, by estimation, is 40 to 45%. The left ventricle has mildly decreased function. The left ventricle demonstrates global hypokinesis. There is mild left ventricular hypertrophy of the basal-septal segment. Left ventricular diastolic parameters are  consistent with Grade II diastolic dysfunction (pseudonormalization).  2. Right ventricular systolic function is normal. The right ventricular size is normal. There is normal pulmonary artery systolic pressure.  3. Left atrial size was moderately dilated.  4. The mitral valve is normal in structure. Trivial mitral valve regurgitation. No evidence of mitral stenosis.  5. Aortic valve stenosis is mild by peak velocity and mean gradient. However, the valve is severely calcified and visually, restricted. Given that the LVEF is reduced, the mean gradient is likely underestimating the severity of stenosis due to low flow-low gradient. Dimentionless index is 0.36 and valve area by planimetry is 1.79 cm^2, more consistent with moderate aortic stenosis. The aortic valve is calcified. There is severe calcifcation of the aortic valve. There is severe thickening of the aortic valve. Aortic valve regurgitation is not visualized. Moderate aortic valve stenosis. Aortic valve area, by VTI measures 1.12 cm. Aortic valve mean gradient measures 16.0 mmHg. Aortic valve Vmax measures 2.91 m/s.  6. The inferior vena cava is normal in size with greater than 50% respiratory variability, suggesting right atrial pressure of 3 mmHg. FINDINGS  Left Ventricle: Left ventricular ejection fraction, by estimation, is 40 to 45%. The left ventricle has mildly decreased function. The left ventricle demonstrates global hypokinesis. The left ventricular internal cavity size was normal in size. There is  mild left ventricular hypertrophy of the basal-septal segment. Left ventricular diastolic parameters are consistent with Grade II diastolic dysfunction (pseudonormalization). Indeterminate filling pressures. Right Ventricle: The right ventricular size is normal. No increase in right ventricular wall thickness. Right ventricular systolic function is normal. There is normal pulmonary artery systolic pressure. The tricuspid regurgitant velocity is 2.05  m/s, and  with an assumed right atrial pressure of 3 mmHg, the estimated right ventricular systolic pressure is 70.2 mmHg. Left Atrium: Left atrial size was moderately dilated. Right Atrium: Right atrial size was normal in size. Pericardium: There is no evidence of pericardial effusion. Mitral Valve: The mitral valve is normal in structure. There is mild thickening of the anterior mitral valve leaflet(s). Trivial mitral valve regurgitation. No evidence of mitral valve stenosis. Tricuspid Valve: The tricuspid valve is normal in structure. Tricuspid valve regurgitation is trivial. No evidence of tricuspid stenosis. Aortic Valve: Aortic valve stenosis is mild by peak velocity and mean gradient. However, the valve is severely calcified and visually, restricted. Given that the LVEF is reduced, the mean gradient is likely underestimating the severity of stenosis due to  low flow-low gradient. Dimentionless index is 0.36 and valve area by planimetry is 1.79 cm^2, more consistent with moderate aortic stenosis. The aortic valve is calcified. There is severe calcifcation of the aortic valve. There is severe thickening of the aortic valve. Aortic valve regurgitation is not visualized. Moderate aortic stenosis is present. Aortic valve mean gradient measures 16.0 mmHg. Aortic valve peak gradient measures 33.9 mmHg. Aortic valve area, by VTI measures 1.12 cm. Pulmonic Valve: The pulmonic valve was normal in structure. Pulmonic valve regurgitation is not visualized. No evidence of pulmonic stenosis. Aorta: The aortic root is normal in size and structure. Venous: The inferior vena cava is normal in size with greater than 50% respiratory variability, suggesting right atrial pressure of 3  mmHg. IAS/Shunts: No atrial level shunt detected by color flow Doppler.  LEFT VENTRICLE PLAX 2D LVIDd:         4.70 cm      Diastology LVIDs:         3.80 cm      LV e' medial:    6.96 cm/s LV PW:         0.80 cm      LV E/e' medial:  12.2 LV IVS:         1.10 cm      LV e' lateral:   7.07 cm/s LVOT diam:     2.00 cm      LV E/e' lateral: 12.1 LV SV:         63 LV SV Index:   33 LVOT Area:     3.14 cm  LV Volumes (MOD) LV vol d, MOD A2C: 94.9 ml LV vol d, MOD A4C: 114.0 ml LV vol s, MOD A2C: 49.5 ml LV vol s, MOD A4C: 59.4 ml LV SV MOD A2C:     45.4 ml LV SV MOD A4C:     114.0 ml LV SV MOD BP:      49.1 ml RIGHT VENTRICLE RV S prime:     9.25 cm/s TAPSE (M-mode): 2.1 cm LEFT ATRIUM             Index        RIGHT ATRIUM           Index LA diam:        4.90 cm 2.56 cm/m   RA Area:     11.20 cm LA Vol (A2C):   73.7 ml 38.55 ml/m  RA Volume:   25.20 ml  13.18 ml/m LA Vol (A4C):   50.2 ml 26.26 ml/m LA Biplane Vol: 65.6 ml 34.31 ml/m  AORTIC VALVE AV Area (Vmax):    0.97 cm AV Area (Vmean):   1.13 cm AV Area (VTI):     1.12 cm AV Vmax:           291.00 cm/s AV Vmean:          187.000 cm/s AV VTI:            0.569 m AV Peak Grad:      33.9 mmHg AV Mean Grad:      16.0 mmHg LVOT Vmax:         90.20 cm/s LVOT Vmean:        67.000 cm/s LVOT VTI:          0.202 m LVOT/AV VTI ratio: 0.36  AORTA Ao Root diam: 3.20 cm Ao Asc diam:  3.20 cm MITRAL VALVE               TRICUSPID VALVE MV Area (PHT): 2.61 cm    TR Peak grad:   16.8 mmHg MV Decel Time: 291 msec    TR Vmax:        205.00 cm/s MV E velocity: 85.20 cm/s MV A velocity: 52.80 cm/s  SHUNTS MV E/A ratio:  1.61        Systemic VTI:  0.20 m                            Systemic Diam: 2.00 cm Skeet Latch MD Electronically signed by Skeet Latch MD Signature Date/Time: 10/27/2021/9:11:41 AM    Final    VAS US DOPPLER PRE CABG  Result Date: 10/26/2021 PREOPERATIVE  VASCULAR EVALUATION Patient Name:  Joshua Wyatt  Date of Exam:   10/26/2021 Medical Rec #: [id number]      Accession #:    [id number] Date of Birth: September 25, 1946     Patient Gender: M Patient Age:   23 years Exam Location:  [facility] Procedure:      VAS US DOPPLER PRE CABG Referring Phys: Remo Lipps HENDRICKSON  --------------------------------------------------------------------------------  Indications:            Pre-CABG. Risk Factors:           Hypertension, hyperlipidemia, past history of smoking,                         coronary artery disease, PAD. Vascular Interventions: H/o bilateral carotid endarterectomy, right ICA stent. Comparison Study:       11/08/2020- carotid artery duplex, ABI Performing Technologist: Maudry Mayhew MHA, RVT, RDCS, RDMS  Examination Guidelines: A complete evaluation includes B-mode imaging, spectral Doppler, color Doppler, and power Doppler as needed of all accessible portions of each vessel. Bilateral testing is considered an integral part of a complete examination. Limited examinations for reoccurring indications may be performed as noted.  Right Carotid Findings: +--------------------------+--------+--------+--------+--------+--------+                           PSV cm/sEDV cm/sStenosisDescribeComments +--------------------------+--------+--------+--------+--------+--------+ CCA Prox                  64      10                               +--------------------------+--------+--------+--------+--------+--------+ CCA Distal (prox to stent)64      7                                +--------------------------+--------+--------+--------+--------+--------+ Prox Stent                41      12                               +--------------------------+--------+--------+--------+--------+--------+ Mid Stent                 57      10                               +--------------------------+--------+--------+--------+--------+--------+ Distal Stent              77      17                               +--------------------------+--------+--------+--------+--------+--------+ Distal to Stent           125     32                               +--------------------------+--------+--------+--------+--------+--------+  +----------+--------+-------+----------------+------------+           PSV cm/sEDV cmsDescribe        Arm Pressure +----------+--------+-------+----------------+------------+ Subclavian210            Multiphasic, WNL             +----------+--------+-------+----------------+------------+ +---------+--------+--+--------+--+---------+  VertebralPSV cm/s90EDV cm/s27Antegrade +---------+--------+--+--------+--+---------+  Left Carotid Findings: +----------+--------+--------+--------+------------------------------+---------+           PSV cm/sEDV cm/sStenosisDescribe                      Comments  +----------+--------+--------+--------+------------------------------+---------+ CCA Prox  50      16              smooth and heterogenous                 +----------+--------+--------+--------+------------------------------+---------+ CCA Distal67      13              calcific                      Shadowing +----------+--------+--------+--------+------------------------------+---------+ ICA Prox  40      10              heterogenous, irregular and   Shadowing                                   calcific                                +----------+--------+--------+--------+------------------------------+---------+ ICA Distal95      17                                                      +----------+--------+--------+--------+------------------------------+---------+ ECA       140     17              heterogenous, irregular and   shadowing                                   calcific                                +----------+--------+--------+--------+------------------------------+---------+ +----------+--------+--------+----------------+------------+ SubclavianPSV cm/sEDV cm/sDescribe        Arm Pressure +----------+--------+--------+----------------+------------+           237             Multiphasic, WNL              +----------+--------+--------+----------------+------------+ +---------+--------+---+--------+--+---------+ VertebralPSV cm/s139EDV cm/s11Antegrade +---------+--------+---+--------+--+---------+  ABI Findings: +---------+------------------+-----+----------+----------------+ Right    Rt Pressure (mmHg)IndexWaveform  Comment          +---------+------------------+-----+----------+----------------+ Brachial 134                    triphasic                  +---------+------------------+-----+----------+----------------+ PTA      125               0.88 biphasic  Audibly biphasic +---------+------------------+-----+----------+----------------+ DP       120               0.85 monophasic                 +---------+------------------+-----+----------+----------------+ Great Toe141               0.99                            +---------+------------------+-----+----------+----------------+ +---------+------------------+-----+----------+-------+  Left     Lt Pressure (mmHg)IndexWaveform  Comment +---------+------------------+-----+----------+-------+ Brachial 142                    triphasic         +---------+------------------+-----+----------+-------+ PTA      125               0.88 triphasic         +---------+------------------+-----+----------+-------+ DP       120               0.85 monophasic        +---------+------------------+-----+----------+-------+ Great Toe102               0.72                   +---------+------------------+-----+----------+-------+ +-------+---------------+----------------+ ABI/TBIToday's ABI/TBIPrevious ABI/TBI +-------+---------------+----------------+ Right  0.88/0.99      1.01/0.96        +-------+---------------+----------------+ Left   0.88/0.72      0.96/0.88        +-------+---------------+----------------+  Right Doppler Findings: +-----------+--------+-----+---------+--------------------+ Site        PressureIndexDoppler  Comments             +-----------+--------+-----+---------+--------------------+ Brachial   134          triphasic                     +-----------+--------+-----+---------+--------------------+ Radial                  triphasic                     +-----------+--------+-----+---------+--------------------+ Ulnar                   triphasic                     +-----------+--------+-----+---------+--------------------+ Palmar Arch                      Within normal limits +-----------+--------+-----+---------+--------------------+  Left Doppler Findings: +-----------+--------+-----+---------+-----------------------------------------+ Site       PressureIndexDoppler  Comments                                  +-----------+--------+-----+---------+-----------------------------------------+ Brachial   142          triphasic                                          +-----------+--------+-----+---------+-----------------------------------------+ Radial                  triphasic                                          +-----------+--------+-----+---------+-----------------------------------------+ Ulnar                   triphasic                                          +-----------+--------+-----+---------+-----------------------------------------+ Palmar Arch  Signal is unaffected with radial                                           compression, obliterates with ulnar                                        compression                               +-----------+--------+-----+---------+-----------------------------------------+  Summary: Right Carotid: The right ICA stent appears <50% stenosed. Arterial channel                visualized connecting mid to distal area of ICA stent to adjacent                area measuring 1.0 cm with 0.2cm neck. Etiology is unknown.                Recommend CTA neck for  further evaluation if clinically                indicated. Left Carotid: Velocities in the left ICA are consistent with a 1-39% stenosis. Vertebrals:  Bilateral vertebral arteries demonstrate antegrade flow. Subclavians: Normal flow hemodynamics were seen in bilateral subclavian              arteries. Right ABI: Resting right ankle-brachial index indicates mild right lower extremity arterial disease. The right toe-brachial index is normal. Left ABI: Resting left ankle-brachial index indicates mild left lower extremity arterial disease. The left toe-brachial index is normal. Right Upper Extremity: Doppler waveforms remain within normal limits with right radial compression. Doppler waveforms remain within normal limits with right ulnar compression. Left Upper Extremity: Doppler waveforms remain within normal limits with left radial compression. Doppler waveform obliterate with left ulnar compression.  Bilateral ABIs appear decreased compared to prior study on 11/08/2020. Left TBIs appear decreased compared to prior study on 11/08/2020.  Electronically signed by Orlie Pollen on 10/26/2021 at 7:11:46 PM.    Final     Cardiac Studies   LEFT HEART CATH AND CORONARY ANGIOGRAPHY   Conclusion      Ost RCA lesion is 80% stenosed.   Prox RCA to Mid RCA lesion is 40% stenosed.   Mid LM to Dist LM lesion is 90% stenosed.   Ost LAD to Mid LAD lesion is 50% stenosed.   Previously placed Ost RCA to Prox RCA stent (unknown type) is  widely patent.   IMPRESSIONS     1. Left ventricular ejection fraction, by estimation, is 40 to 45%. The  left ventricle has mildly decreased function. The left ventricle  demonstrates global hypokinesis. There is mild left ventricular  hypertrophy of the basal-septal segment. Left  ventricular diastolic parameters are consistent with Grade II diastolic  dysfunction (pseudonormalization).   2. Right ventricular systolic function is normal. The right ventricular  size is normal.  There is normal pulmonary artery systolic pressure.   3. Left atrial size was moderately dilated.   4. The mitral valve is normal in structure. Trivial mitral valve  regurgitation. No evidence of mitral stenosis.   5. Aortic valve stenosis is mild by peak velocity and mean gradient.  However, the valve is severely calcified and visually, restricted. Given  that the LVEF is reduced, the mean gradient is likely underestimating the  severity of stenosis due to low  flow-low gradient. Dimentionless index is 0.36 and valve area by  planimetry is 1.79 cm^2, more consistent with moderate aortic stenosis.  The aortic valve is calcified. There is severe calcifcation of the aortic  valve. There is severe thickening of the  aortic valve. Aortic valve regurgitation is not visualized. Moderate  aortic valve stenosis. Aortic valve area, by VTI measures 1.12 cm. Aortic  valve mean gradient measures 16.0 mmHg. Aortic valve Vmax measures 2.91  m/s.   6. The inferior vena cava is normal in size with greater than 50%  respiratory variability, suggesting right atrial pressure of 3 mmHg.  Patient Profile     75 y.o. male with PAD, CAD with prior stent, HTN, HLD presents with recent angina   Assessment & Plan    Severe left main CAD. Plan for CABG today with Dr Roxan Hockey. PAD s/p bilateral CEA. S/p multiple revascularization interventions of the LE. HTN CKD stage 3a HLD  Aortic stenosis. By Echo this is mild to moderate. On cath looks like there is about a 10 mm gradient. Does not appear to have severe AS on exam.  For questions or updates, please contact [place] Please consult [email or web address] for contact info under        Signed, Jennipher Weatherholtz [place], MD  10/27/2021, 10:39 AM

## 2021-10-27 NOTE — Anesthesia Procedure Notes (Signed)
Central Venous Catheter Insertion Performed by: Nolon Nations, MD, anesthesiologist Start/End11/18/2022 2:12 PM, 10/27/2021 2:26 PM Patient location: Pre-op. Preanesthetic checklist: patient identified, IV checked, site marked, risks and benefits discussed, surgical consent, monitors and equipment checked, pre-op evaluation, timeout performed and anesthesia consent Position: Trendelenburg Lidocaine 1% used for infiltration and patient sedated Hand hygiene performed  and maximum sterile barriers used  Catheter size: 9 Fr MAC introducer Procedure performed using ultrasound guided technique. Ultrasound Notes:anatomy identified, needle tip was noted to be adjacent to the nerve/plexus identified, no ultrasound evidence of intravascular and/or intraneural injection and image(s) printed for medical record Attempts: 1 Following insertion, line sutured, dressing applied and Biopatch. Post procedure assessment: blood return through all ports, free fluid flow and no air  Patient tolerated the procedure well with no immediate complications.

## 2021-10-27 NOTE — Interval H&P Note (Signed)
History and Physical Interval Note:  Carotid US results noted, can be worked up as outpatient Will proceed with urgent CABG for left main disease   10/27/2021 2:23 PM  Joshua Wyatt  has presented today for surgery, with the diagnosis of CAD.  The various methods of treatment have been discussed with the patient and family. After consideration of risks, benefits and other options for treatment, the patient has consented to  Procedure(s): CORONARY ARTERY BYPASS GRAFTING (CABG) (N/A) TRANSESOPHAGEAL ECHOCARDIOGRAM (TEE) (N/A) APPLICATION OF CELL SAVER ENDOVEIN HARVEST OF GREATER SAPHENOUS VEIN as a surgical intervention.  The patient's history has been reviewed, patient examined, no change in status, stable for surgery.  I have reviewed the patient's chart and labs.  Questions were answered to the patient's satisfaction.     [name]

## 2021-10-27 NOTE — Anesthesia Preprocedure Evaluation (Signed)
Anesthesia Evaluation    Reviewed: Allergy & Precautions, Patient's Chart, lab work & pertinent test results, Unable to perform ROS - Chart review only  History of Anesthesia Complications Negative for: history of anesthetic complications  Airway Mallampati: I  TM Distance: >3 FB Neck ROM: Full    Dental  (+) Upper Dentures, Lower Dentures, Dental Advisory Given   Pulmonary former smoker,    Pulmonary exam normal        Cardiovascular hypertension, Pt. on medications + angina + CAD, + Cardiac Stents and + Peripheral Vascular Disease  Normal cardiovascular exam+ dysrhythmias + Valvular Problems/Murmurs   Echo 10/27/2021 1. Left ventricular ejection fraction, by estimation, is 40 to 45%. The left ventricle has mildly decreased function. The left ventricle demonstrates global hypokinesis. There is mild left ventricular hypertrophy of the basal-septal segment. Left ventricular diastolic parameters are consistent with Grade II diastolic dysfunction (pseudonormalization).  2. Right ventricular systolic function is normal. The right ventricular size is normal. There is normal pulmonary artery systolic pressure.  3. Left atrial size was moderately dilated.  4. The mitral valve is normal in structure. Trivial mitral valve regurgitation. No evidence of mitral stenosis.  5. Aortic valve stenosis is mild by peak velocity and mean gradient. However, the valve is severely calcified and visually, restricted. Given that the LVEF is reduced, the mean gradient is likely underestimating the  severity of stenosis due to low flow-low gradient. Dimentionless index is 0.36 and valve area by  planimetry is 1.79 cm^2, more consistent with moderate aortic stenosis. The aortic valve is calcified. There is severe calcifcation of the aortic valve. There is severe thickening of the aortic valve. Aortic valve regurgitation is not visualized. Moderate aortic valve  stenosis. Aortic valve area, by VTI measures 1.12 cm. Aortic valve mean gradient measures 16.0 mmHg. Aortic valve Vmax measures 2.91 m/s.  6. The inferior vena cava is normal in size with greater than 50% respiratory variability, suggesting right atrial pressure of 3 mmHg.    Neuro/Psych negative neurological ROS  negative psych ROS   GI/Hepatic Neg liver ROS, GERD  Medicated and Controlled,  Endo/Other  negative endocrine ROS  Renal/GU Renal InsufficiencyRenal disease     Musculoskeletal negative musculoskeletal ROS (+)   Abdominal   Peds  Hematology  (+) anemia , HLD   Anesthesia Other Findings   Reproductive/Obstetrics                             Anesthesia Physical  Anesthesia Plan  ASA: 3  Anesthesia Plan: Spinal and MAC   Post-op Pain Management:  Regional for Post-op pain   Induction:   PONV Risk Score and Plan: 1 and Ondansetron and Propofol infusion  Airway Management Planned: Simple Face Mask  Additional Equipment:   Intra-op Plan:   Post-operative Plan:   Informed Consent: I have reviewed the patients History and Physical, chart, labs and discussed the procedure including the risks, benefits and alternatives for the proposed anesthesia with the patient or authorized representative who has indicated his/her understanding and acceptance.     Dental advisory given  Plan Discussed with: CRNA and Anesthesiologist  Anesthesia Plan Comments: (Reviewed PAT note 04/24/19, Konrad Felix, PA-C)        Anesthesia Quick Evaluation

## 2021-10-27 NOTE — Brief Op Note (Signed)
10/26/2021 - 10/27/2021  3:00 PM  PATIENT:  Joshua Wyatt  75 y.o. male  PRE-OPERATIVE DIAGNOSIS:  CORONARY ARTERY DISEASE  POST-OPERATIVE DIAGNOSIS:  CORONARY ARTERY DISEASE  PROCEDURE:  Procedure(s):  CORONARY ARTERY BYPASS GRAFTING x 3 -LIMA to LAD -SVG to PDA -SVG to OM  TRANSESOPHAGEAL ECHOCARDIOGRAM (TEE) (N/A)  APPLICATION OF CELL SAVER  ENDOVEIN HARVEST OF GREATER SAPHENOUS VEIN  -Right Leg ( 25/20)   SURGEON:  Surgeon(s) and Role:    * [name], MD - Primary  PHYSICIAN ASSISTANT: Ellwood Handler PA-C  ASSISTANTS: Dennie Fetters RNFA    ANESTHESIA:   general  EBL:  500 mL   BLOOD ADMINISTERED: 2 units CC PRBC and   CC CELLSAVER  DRAINS:  Left Pleural Chest Tube, Mediastinal Chest Drain    LOCAL MEDICATIONS USED:  NONE  SPECIMEN:  No Specimen  DISPOSITION OF SPECIMEN:  N/A  COUNTS:  YES  TOURNIQUET:  * No tourniquets in log *  DICTATION: .Dragon Dictation  PLAN OF CARE: Admit to inpatient   PATIENT DISPOSITION:  ICU - intubated and hemodynamically stable.   Delay start of Pharmacological VTE agent (>24hrs) due to surgical blood loss or risk of bleeding: yes

## 2021-10-27 NOTE — Anesthesia Procedure Notes (Signed)
Arterial Line Insertion Start/End11/18/2022 2:20 PM, 10/27/2021 2:25 PM Performed by: Thelma Comp, CRNA, CRNA  Patient location: Pre-op. Preanesthetic checklist: patient identified, IV checked, site marked, risks and benefits discussed, surgical consent, monitors and equipment checked, pre-op evaluation, timeout performed and anesthesia consent Lidocaine 1% used for infiltration Right, radial was placed Catheter size: 20 G Hand hygiene performed  and maximum sterile barriers used   Attempts: 1 Procedure performed without using ultrasound guided technique. Following insertion, dressing applied and Biopatch. Post procedure assessment: normal and unchanged  Patient tolerated the procedure well with no immediate complications.

## 2021-10-27 NOTE — Progress Notes (Signed)
  Echocardiogram Echocardiogram Transesophageal has been performed.  Bobbye Charleston 10/27/2021, 2:44 PM

## 2021-10-27 NOTE — Anesthesia Procedure Notes (Signed)
Central Venous Catheter Insertion Performed by: Nolon Nations, MD, anesthesiologist Start/End11/18/2022 2:26 PM, 10/27/2021 2:30 PM Patient location: Pre-op. Preanesthetic checklist: patient identified, IV checked, site marked, risks and benefits discussed, surgical consent, monitors and equipment checked, pre-op evaluation, timeout performed and anesthesia consent Hand hygiene performed  and maximum sterile barriers used  PA cath was placed.Swan type:thermodilution PA Cath depth:47 Procedure performed without using ultrasound guided technique. Attempts: 1 Post procedure assessment: free fluid flow and no air  Patient tolerated the procedure well with no immediate complications.

## 2021-10-27 NOTE — Hospital Course (Addendum)
History of Present Illness:  [name]. Joshua Wyatt is a very pleasant 75 year old gentleman with past medical history with known coronary artery disease status post PTCI with stenting of the ostial right coronary artery by Dr. Gwenlyn Wyatt in 2019.  He also has a history of stage III chronic kidney disease, dyslipidemia, hypertension, cerebrovascular disease status post bilateral carotid endarterectomies and subsequent stenting of the right coronary artery by Dr. Gwenlyn Wyatt.  He has had multiple percutaneous procedures to the lower extremities as well.  He has degenerative joint disease and is status post bilateral knee replacements, the most recent being in his left knee about 6 weeks ago.  He is a former 58-pack-year smoker having quit 8 years ago. Joshua Wyatt reports he was fairly active lifestyle after retiring from [employer] several years ago.  He plays golf 2-3 times a week.  He noticed in the last month that we have exertional chest pain while on the golf course.  This would subside with rest and sublingual nitroglycerin.  Most recent episode presenting from completing again.  He contacted his cardiologist to discuss the symptoms and arrangements were made for evaluation with left heart catheterization.  Study was performed earlier this morning by Dr. Gwenlyn Wyatt and demonstrates severe left main coronary artery stenosis and two-vessel coronary artery disease.  The previously placed right coronary artery stent was patent but he appeared to have an 80% ostial RCA stenosis.  There was a 90% distal left main coronary stenosis followed by 40 to 50% since then stenoses in the LAD.  The circumflex coronary system did not have any significant obstructive disease.  A left ventriculogram was not performed.  He had an echo in September of this year demonstrating left ventricular ejection fraction of 50 to 55%.  There was mild mitral insufficiency but no stenosis.  There was mild aortic stenosis but no aortic insufficiency.  There  was no abnormalities of the proximal aorta.  Cardiothoracic surgery consultation was requested.  He was evaluated by Dr. Roxan Wyatt who felt patient would benefit from coronary artery bypass grafting procedure.  The risks and benefits of the procedure were explained to the patient and he was agreeable to proceed.  Hospital Course:  Patient remained chest pain free prior to surgery.  He was taken to the operating room on 10/27/2021 and underwent CABG x 3 utilizing LIMA to LAD, SVG to OM, and SVG to PDA.  He also underwent endoscopic harvest of greater saphenous vein from his right leg.  He had operative anemia and required 2 units of packed cells during his surgical procedure.  He tolerated the procedure without difficulty and was taken to the SICU in stable condition.  He was weaned an extubated on POD #1.  He underwent debridement of chest tubes and arterial lines without difficulty.  The patient developed tachycardia and hypertension. His Lopressor was increased as hemodynamics allowed.  The patient developed AKI with creatinine level peaking at 1.89.  He did have  a fib post op. He was put on Amiodarone. He converted to sinus rhythm and was transitioned to oral Amiodarone on 11/22. He was gradually diuresed as creatinine continued to improve. He was weaned off the Insulin drip. His pre op HGA1C was 5.7. Accu checks and SS PRN will be stopped after transfer. He was in sinus rhythm with PACs. Epicardial pacing wires were removed on 11/23.

## 2021-10-27 NOTE — Progress Notes (Signed)
Echocardiogram 2D Echocardiogram has been performed.  Oneal Deputy Brianne Maina RDCS 10/27/2021, 8:55 AM  Dr. Oval Linsey to read before CABG

## 2021-10-27 NOTE — Progress Notes (Signed)
      [address]       [place] [address]             [phone]        Just back from OR In Sr in 70's 102/53 PA= 29/15, CI 2.1 on dopamine at 3  Minimal CT output  Stable early postop  Greco Gastelum C. Roxan Hockey, MD [facility] [phone]

## 2021-10-27 NOTE — Anesthesia Procedure Notes (Signed)
Procedure Name: Intubation Date/Time: 10/27/2021 3:05 PM Performed by: Thelma Comp, CRNA Pre-anesthesia Checklist: Patient identified, Emergency Drugs available, Suction available and Patient being monitored Patient Re-evaluated:Patient Re-evaluated prior to induction Oxygen Delivery Method: Circle System Utilized Preoxygenation: Pre-oxygenation with 100% oxygen Induction Type: IV induction Ventilation: Mask ventilation without difficulty Laryngoscope Size: Mac and 4 Grade View: Grade I Tube type: Oral Tube size: 8.0 mm Number of attempts: 1 Airway Equipment and Method: Stylet Placement Confirmation: ETT inserted through vocal cords under direct vision, positive ETCO2 and breath sounds checked- equal and bilateral Secured at: 23 cm Tube secured with: Tape Dental Injury: Teeth and Oropharynx as per pre-operative assessment

## 2021-10-27 NOTE — Transfer of Care (Signed)
Immediate Anesthesia Transfer of Care Note  Patient: Joshua Wyatt  Procedure(s) Performed: CORONARY ARTERY BYPASS GRAFTING (CABG) X THREE ON PUMP USING LEFT INTERNAL MAMMARY ARTERY AND RIGHT ENDOSCOPIC GREATER SAPHENOUS VEIN CONDUITS (Chest) TRANSESOPHAGEAL ECHOCARDIOGRAM (TEE) APPLICATION OF CELL SAVER ENDOVEIN HARVEST OF GREATER SAPHENOUS VEIN (Right)  Patient Location: SICU  Anesthesia Type:General  Level of Consciousness: Patient remains intubated per anesthesia plan  Airway & Oxygen Therapy: Patient remains intubated per anesthesia plan and Patient placed on Ventilator (see vital sign flow sheet for setting)  Post-op Assessment: Report given to RN and Post -op Vital signs reviewed and stable  Post vital signs: Reviewed and stable  Last Vitals:  Vitals Value Taken Time  BP    Temp 36.5 C 10/27/21 1941  Pulse 79 10/27/21 1941  Resp 12 10/27/21 1941  SpO2 92 % 10/27/21 1941  Vitals shown include unvalidated device data.  Last Pain:  Vitals:   10/27/21 1355  TempSrc: Oral  PainSc: 0-No pain         Complications: No notable events documented.

## 2021-10-28 ENCOUNTER — Inpatient Hospital Stay (HOSPITAL_COMMUNITY): Payer: Medicare Other

## 2021-10-28 DIAGNOSIS — Z951 Presence of aortocoronary bypass graft: Secondary | ICD-10-CM

## 2021-10-28 LAB — BASIC METABOLIC PANEL
Anion gap: 7 (ref 5–15)
Anion gap: 8 (ref 5–15)
BUN: 12 mg/dL (ref 8–23)
BUN: 15 mg/dL (ref 8–23)
CO2: 22 mmol/L (ref 22–32)
CO2: 23 mmol/L (ref 22–32)
Calcium: 8.5 mg/dL — ABNORMAL LOW (ref 8.9–10.3)
Calcium: 8.8 mg/dL — ABNORMAL LOW (ref 8.9–10.3)
Chloride: 108 mmol/L (ref 98–111)
Chloride: 107 mmol/L (ref 98–111)
Creatinine, Ser: 1.25 mg/dL — ABNORMAL HIGH (ref 0.61–1.24)
Creatinine, Ser: 1.57 mg/dL — ABNORMAL HIGH (ref 0.61–1.24)
GFR, Estimated: 60 mL/min — ABNORMAL LOW (ref >60)
GFR, Estimated: 45 mL/min — ABNORMAL LOW (ref >60)
Glucose, Bld: 124 mg/dL — ABNORMAL HIGH (ref 70–99)
Glucose, Bld: 140 mg/dL — ABNORMAL HIGH (ref 70–99)
Potassium: 4.1 mmol/L (ref 3.5–5.1)
Potassium: 4 mmol/L (ref 3.5–5.1)
Sodium: 137 mmol/L (ref 135–145)
Sodium: 138 mmol/L (ref 135–145)

## 2021-10-28 LAB — POCT I-STAT 7, (LYTES, BLD GAS, ICA,H+H)
Acid-base deficit: 3 mmol/L — ABNORMAL HIGH (ref 0.0–2.0)
Acid-base deficit: 4 mmol/L — ABNORMAL HIGH (ref 0.0–2.0)
Acid-base deficit: 2 mmol/L (ref 0.0–2.0)
Acid-base deficit: 5 mmol/L — ABNORMAL HIGH (ref 0.0–2.0)
Acid-base deficit: 7 mmol/L — ABNORMAL HIGH (ref 0.0–2.0)
Acid-base deficit: 3 mmol/L — ABNORMAL HIGH (ref 0.0–2.0)
Acid-base deficit: 3 mmol/L — ABNORMAL HIGH (ref 0.0–2.0)
Bicarbonate: 23.5 mmol/L (ref 20.0–28.0)
Bicarbonate: 21.1 mmol/L (ref 20.0–28.0)
Bicarbonate: 22.8 mmol/L (ref 20.0–28.0)
Bicarbonate: 19.6 mmol/L — ABNORMAL LOW (ref 20.0–28.0)
Bicarbonate: 18.1 mmol/L — ABNORMAL LOW (ref 20.0–28.0)
Bicarbonate: 22.3 mmol/L (ref 20.0–28.0)
Bicarbonate: 22.3 mmol/L (ref 20.0–28.0)
Calcium, Ion: 1.3 mmol/L (ref 1.15–1.40)
Calcium, Ion: 1.25 mmol/L (ref 1.15–1.40)
Calcium, Ion: 1.27 mmol/L (ref 1.15–1.40)
Calcium, Ion: 1.27 mmol/L (ref 1.15–1.40)
Calcium, Ion: 1.19 mmol/L (ref 1.15–1.40)
Calcium, Ion: 1.26 mmol/L (ref 1.15–1.40)
Calcium, Ion: 1.27 mmol/L (ref 1.15–1.40)
HCT: 32 % — ABNORMAL LOW (ref 39.0–52.0)
HCT: 32 % — ABNORMAL LOW (ref 39.0–52.0)
HCT: 30 % — ABNORMAL LOW (ref 39.0–52.0)
HCT: 33 % — ABNORMAL LOW (ref 39.0–52.0)
HCT: 25 % — ABNORMAL LOW (ref 39.0–52.0)
HCT: 30 % — ABNORMAL LOW (ref 39.0–52.0)
HCT: 30 % — ABNORMAL LOW (ref 39.0–52.0)
Hemoglobin: 10.9 g/dL — ABNORMAL LOW (ref 13.0–17.0)
Hemoglobin: 10.9 g/dL — ABNORMAL LOW (ref 13.0–17.0)
Hemoglobin: 10.2 g/dL — ABNORMAL LOW (ref 13.0–17.0)
Hemoglobin: 11.2 g/dL — ABNORMAL LOW (ref 13.0–17.0)
Hemoglobin: 8.5 g/dL — ABNORMAL LOW (ref 13.0–17.0)
Hemoglobin: 10.2 g/dL — ABNORMAL LOW (ref 13.0–17.0)
Hemoglobin: 10.2 g/dL — ABNORMAL LOW (ref 13.0–17.0)
O2 Saturation: 94 %
O2 Saturation: 98 %
O2 Saturation: 99 %
O2 Saturation: 93 %
O2 Saturation: 98 %
O2 Saturation: 95 %
O2 Saturation: 95 %
Patient temperature: 36.5
Patient temperature: 35.8
Patient temperature: 37.6
Patient temperature: 38.4
Patient temperature: 37.5
Patient temperature: 37
Patient temperature: 37
Potassium: 3.9 mmol/L (ref 3.5–5.1)
Potassium: 3.9 mmol/L (ref 3.5–5.1)
Potassium: 4.1 mmol/L (ref 3.5–5.1)
Potassium: 4.1 mmol/L (ref 3.5–5.1)
Potassium: 3.6 mmol/L (ref 3.5–5.1)
Potassium: 4.2 mmol/L (ref 3.5–5.1)
Potassium: 4.3 mmol/L (ref 3.5–5.1)
Sodium: 141 mmol/L (ref 135–145)
Sodium: 141 mmol/L (ref 135–145)
Sodium: 140 mmol/L (ref 135–145)
Sodium: 140 mmol/L (ref 135–145)
Sodium: 141 mmol/L (ref 135–145)
Sodium: 140 mmol/L (ref 135–145)
Sodium: 141 mmol/L (ref 135–145)
TCO2: 25 mmol/L (ref 22–32)
TCO2: 22 mmol/L (ref 22–32)
TCO2: 24 mmol/L (ref 22–32)
TCO2: 21 mmol/L — ABNORMAL LOW (ref 22–32)
TCO2: 19 mmol/L — ABNORMAL LOW (ref 22–32)
TCO2: 24 mmol/L (ref 22–32)
TCO2: 24 mmol/L (ref 22–32)
pCO2 arterial: 47.8 mmHg (ref 32.0–48.0)
pCO2 arterial: 36.5 mmHg (ref 32.0–48.0)
pCO2 arterial: 40.9 mmHg (ref 32.0–48.0)
pCO2 arterial: 37.5 mmHg (ref 32.0–48.0)
pCO2 arterial: 33.2 mmHg (ref 32.0–48.0)
pCO2 arterial: 39.5 mmHg (ref 32.0–48.0)
pCO2 arterial: 40.5 mmHg (ref 32.0–48.0)
pH, Arterial: 7.297 — ABNORMAL LOW (ref 7.350–7.450)
pH, Arterial: 7.363 (ref 7.350–7.450)
pH, Arterial: 7.356 (ref 7.350–7.450)
pH, Arterial: 7.333 — ABNORMAL LOW (ref 7.350–7.450)
pH, Arterial: 7.346 — ABNORMAL LOW (ref 7.350–7.450)
pH, Arterial: 7.349 — ABNORMAL LOW (ref 7.350–7.450)
pH, Arterial: 7.361 (ref 7.350–7.450)
pO2, Arterial: 77 mmHg — ABNORMAL LOW (ref 83.0–108.0)
pO2, Arterial: 101 mmHg (ref 83.0–108.0)
pO2, Arterial: 133 mmHg — ABNORMAL HIGH (ref 83.0–108.0)
pO2, Arterial: 75 mmHg — ABNORMAL LOW (ref 83.0–108.0)
pO2, Arterial: 103 mmHg (ref 83.0–108.0)
pO2, Arterial: 78 mmHg — ABNORMAL LOW (ref 83.0–108.0)
pO2, Arterial: 79 mmHg — ABNORMAL LOW (ref 83.0–108.0)

## 2021-10-28 LAB — CBC
HCT: 32.6 % — ABNORMAL LOW (ref 39.0–52.0)
HCT: 35.7 % — ABNORMAL LOW (ref 39.0–52.0)
Hemoglobin: 10.7 g/dL — ABNORMAL LOW (ref 13.0–17.0)
Hemoglobin: 11.3 g/dL — ABNORMAL LOW (ref 13.0–17.0)
MCH: 29.6 pg (ref 26.0–34.0)
MCH: 29.2 pg (ref 26.0–34.0)
MCHC: 32.8 g/dL (ref 30.0–36.0)
MCHC: 31.7 g/dL (ref 30.0–36.0)
MCV: 90.1 fL (ref 80.0–100.0)
MCV: 92.2 fL (ref 80.0–100.0)
Platelets: 174 10*3/uL (ref 150–400)
Platelets: 175 10*3/uL (ref 150–400)
RBC: 3.62 MIL/uL — ABNORMAL LOW (ref 4.22–5.81)
RBC: 3.87 MIL/uL — ABNORMAL LOW (ref 4.22–5.81)
RDW: 12.4 % (ref 11.5–15.5)
RDW: 12.9 % (ref 11.5–15.5)
WBC: 12.4 10*3/uL — ABNORMAL HIGH (ref 4.0–10.5)
WBC: 13.6 10*3/uL — ABNORMAL HIGH (ref 4.0–10.5)
nRBC: 0 % (ref 0.0–0.2)
nRBC: 0 % (ref 0.0–0.2)

## 2021-10-28 LAB — MAGNESIUM
Magnesium: 3.1 mg/dL — ABNORMAL HIGH (ref 1.7–2.4)
Magnesium: 2.8 mg/dL — ABNORMAL HIGH (ref 1.7–2.4)

## 2021-10-28 LAB — GLUCOSE, CAPILLARY
Glucose-Capillary: 120 mg/dL — ABNORMAL HIGH (ref 70–99)
Glucose-Capillary: 122 mg/dL — ABNORMAL HIGH (ref 70–99)
Glucose-Capillary: 128 mg/dL — ABNORMAL HIGH (ref 70–99)
Glucose-Capillary: 132 mg/dL — ABNORMAL HIGH (ref 70–99)
Glucose-Capillary: 134 mg/dL — ABNORMAL HIGH (ref 70–99)
Glucose-Capillary: 135 mg/dL — ABNORMAL HIGH (ref 70–99)
Glucose-Capillary: 135 mg/dL — ABNORMAL HIGH (ref 70–99)
Glucose-Capillary: 135 mg/dL — ABNORMAL HIGH (ref 70–99)
Glucose-Capillary: 138 mg/dL — ABNORMAL HIGH (ref 70–99)
Glucose-Capillary: 140 mg/dL — ABNORMAL HIGH (ref 70–99)
Glucose-Capillary: 141 mg/dL — ABNORMAL HIGH (ref 70–99)
Glucose-Capillary: 152 mg/dL — ABNORMAL HIGH (ref 70–99)
Glucose-Capillary: 158 mg/dL — ABNORMAL HIGH (ref 70–99)
Glucose-Capillary: 68 mg/dL — ABNORMAL LOW (ref 70–99)
Glucose-Capillary: 82 mg/dL (ref 70–99)

## 2021-10-28 IMAGING — DX DG CHEST 1V PORT
1 series · 1 of 1 positions shown · non-contrast
Comparison: [DATE]

CLINICAL DATA: CABG

EXAM:
PORTABLE CHEST 1 VIEW

[chest]
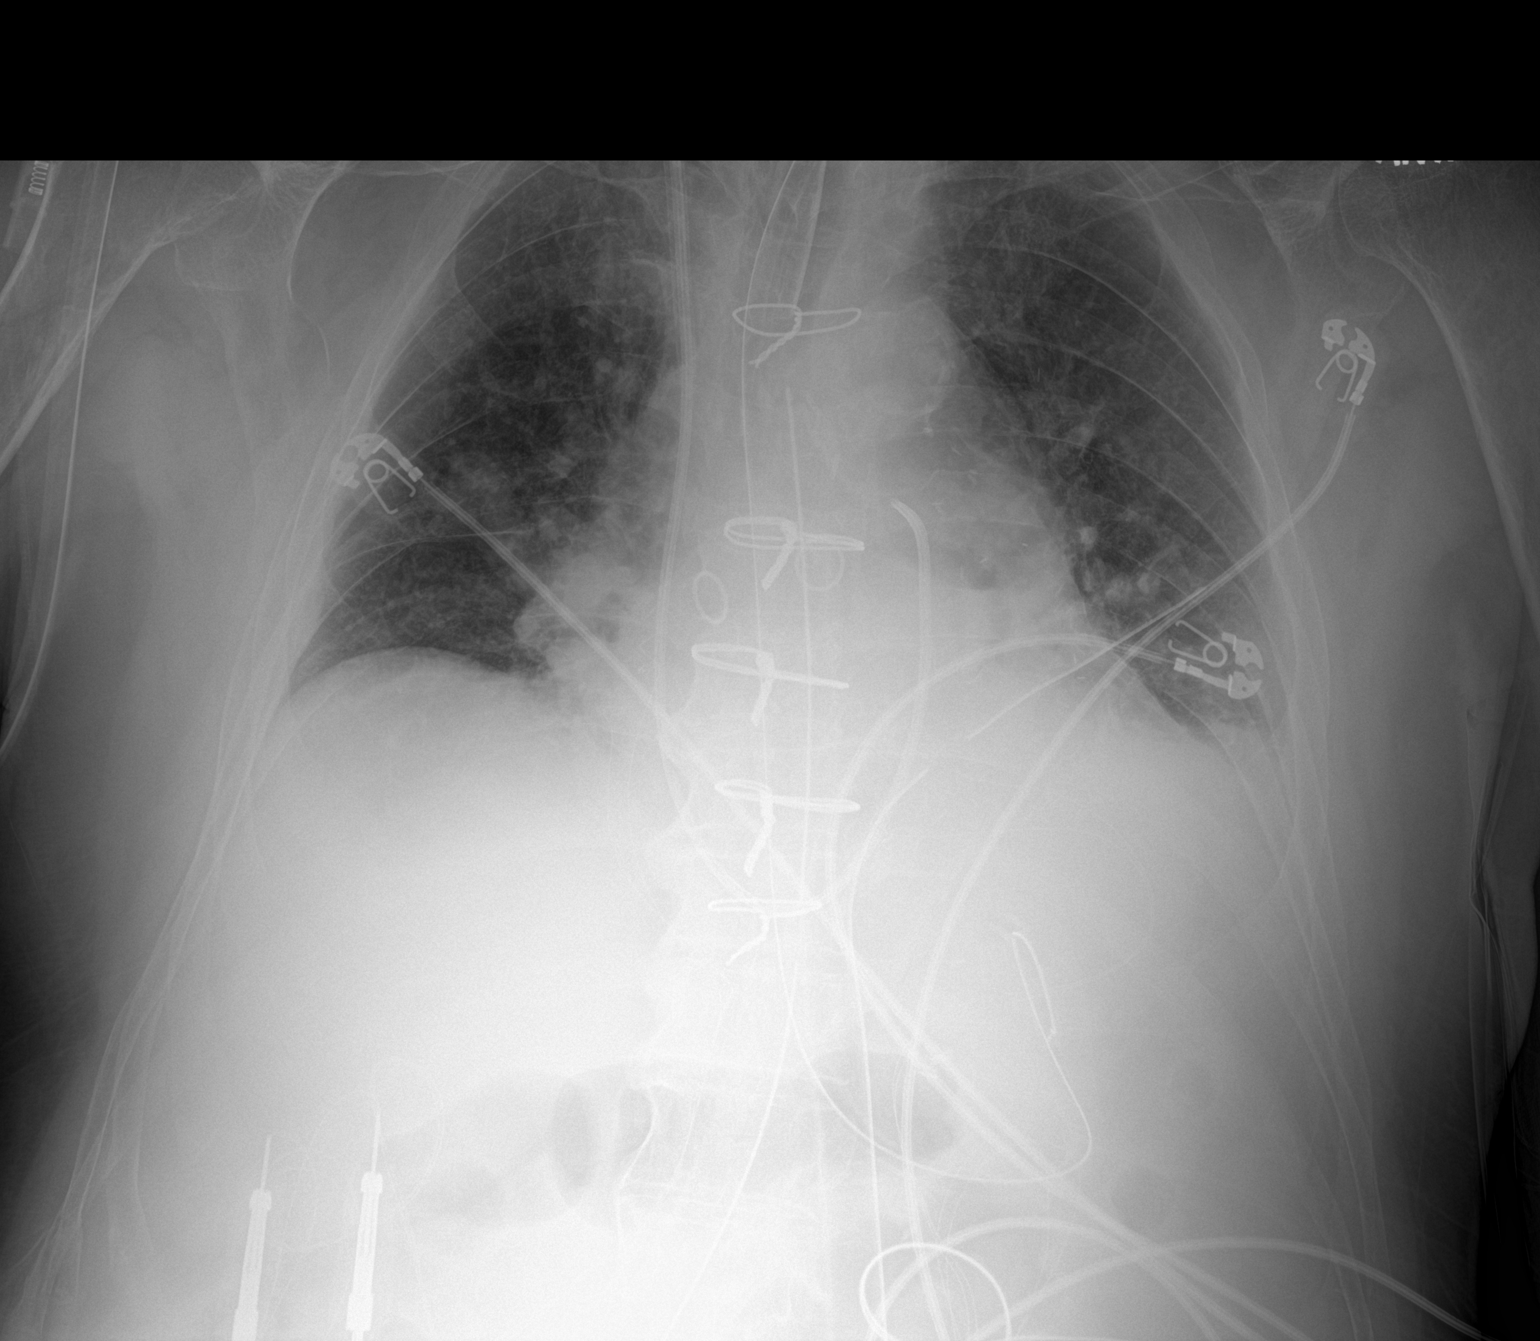

[1 of 1 positions shown; findings below may reference images not displayed]

FINDINGS: Single frontal view of the chest demonstrates stable endotracheal
tube, enteric catheter, right internal jugular flow directed central
venous catheter, mediastinal drain, and left chest tube. Epicardial
pacing wires are again noted. Postsurgical changes are seen from
CABG. The cardiac silhouette remains enlarged. Lung volumes are
diminished, with persistent left basilar consolidation likely
representing atelectasis. Trace left pleural effusion. No
pneumothorax.
IMPRESSION: 1. Stable support devices.
2. Left basilar consolidation, favor atelectasis.
3. Stable trace left pleural effusion.

## 2021-10-28 MED ORDER — INSULIN ASPART 100 UNIT/ML IJ SOLN
0.0000 [IU] | INTRAMUSCULAR | Status: DC
  Administered 2021-10-28 – 2021-10-29 (×3): 2 [IU] via SUBCUTANEOUS

## 2021-10-28 MED ORDER — METOPROLOL TARTRATE 25 MG/10 ML ORAL SUSPENSION: 25.0000 mg | Freq: Two times a day (BID) | ORAL | Status: DC

## 2021-10-28 MED ORDER — METOCLOPRAMIDE HCL 5 MG/ML IJ SOLN
10.0000 mg | Freq: Four times a day (QID) | INTRAMUSCULAR | Status: AC
  Administered 2021-10-29 (×3): 10 mg via INTRAVENOUS
  Filled 2021-10-28 (×4): qty 2

## 2021-10-28 MED ORDER — ENOXAPARIN SODIUM 40 MG/0.4ML IJ SOSY
40.0000 mg | PREFILLED_SYRINGE | Freq: Every day | INTRAMUSCULAR | Status: DC
  Administered 2021-10-28 – 2021-11-02 (×6): 40 mg via SUBCUTANEOUS
  Filled 2021-10-28 (×6): qty 0.4

## 2021-10-28 MED ORDER — METOPROLOL TARTRATE 25 MG PO TABS: 25.0000 mg | ORAL_TABLET | Freq: Two times a day (BID) | ORAL | Status: DC

## 2021-10-28 MED ORDER — METOPROLOL TARTRATE 25 MG PO TABS
25.0000 mg | ORAL_TABLET | Freq: Two times a day (BID) | ORAL | Status: DC
  Administered 2021-10-28 – 2021-11-02 (×10): 25 mg via ORAL
  Filled 2021-10-28 (×10): qty 1

## 2021-10-28 MED ORDER — SODIUM CHLORIDE 0.9 % IV SOLN
6.2500 mg | Freq: Four times a day (QID) | INTRAVENOUS | Status: DC | PRN
  Filled 2021-10-28 (×2): qty 0.25

## 2021-10-28 NOTE — Progress Notes (Addendum)
      [address]       [place] [address]             [phone]      Some incisional pain, nausea earlier  BP 128/71   Pulse 79   Temp 98.2 F (36.8 C) (Oral)   Resp 11   Ht 5' 7.5" (1.715 m)   Wt 78.5 kg   SpO2 95%   BMI 26.70 kg/m   Intake/Output Summary (Last 24 hours) at 10/28/2021 1659 Last data filed at 10/28/2021 1200 Gross per 24 hour  Intake 2679.88 ml  Output 2235 ml  Net 444.88 ml   PM labs pending  Overall doing well  Remo Lipps C. Roxan Hockey, MD [facility] [phone]  Addendum  With sitting up on edge of bed, HR up to 120 ST, BP 370 systolic Will increase metoprolol to 25 BID  Remo Lipps C. Roxan Hockey, MD [facility] [phone]

## 2021-10-28 NOTE — Procedures (Signed)
Extubation Procedure Note  Patient Details:   Name: BRONX BROGDEN DOB: 03/23/1946 MRN: [id number]   Airway Documentation:    Vent end date: 10/28/21 Vent end time: 1045   Evaluation  O2 sats: stable throughout Complications: No apparent complications Patient did tolerate procedure well. Bilateral Breath Sounds: Clear, Diminished   Yes  VC: 1100cc NIF: -25  Esperanza Sheets T 10/28/2021, 11:00 AM

## 2021-10-28 NOTE — Anesthesia Postprocedure Evaluation (Signed)
Anesthesia Post Note  Patient: Joshua Wyatt  Procedure(s) Performed: CORONARY ARTERY BYPASS GRAFTING (CABG) X THREE ON PUMP USING LEFT INTERNAL MAMMARY ARTERY AND RIGHT ENDOSCOPIC GREATER SAPHENOUS VEIN CONDUITS (Chest) TRANSESOPHAGEAL ECHOCARDIOGRAM (TEE) APPLICATION OF CELL SAVER ENDOVEIN HARVEST OF GREATER SAPHENOUS VEIN (Right)     Patient location during evaluation: SICU Anesthesia Type: General Level of consciousness: sedated Pain management: pain level controlled Vital Signs Assessment: post-procedure vital signs reviewed and stable Respiratory status: patient remains intubated per anesthesia plan Cardiovascular status: stable Postop Assessment: no apparent nausea or vomiting Anesthetic complications: no   No notable events documented.  Last Vitals:  Vitals:   10/28/21 0545 10/28/21 0600  BP:  110/68  Pulse: 69 80  Resp: 14 14  Temp: (!) 38.6 C (!) 38.5 C  SpO2: 97% 98%    Last Pain:  Vitals:   10/28/21 0000  TempSrc: Core  PainSc:                  Joshua Wyatt

## 2021-10-28 NOTE — Progress Notes (Signed)
1 Day Post-Op Procedure(s) (LRB): CORONARY ARTERY BYPASS GRAFTING (CABG) X THREE ON PUMP USING LEFT INTERNAL MAMMARY ARTERY AND RIGHT ENDOSCOPIC GREATER SAPHENOUS VEIN CONDUITS (N/A) TRANSESOPHAGEAL ECHOCARDIOGRAM (TEE) (N/A) APPLICATION OF CELL SAVER ENDOVEIN HARVEST OF GREATER SAPHENOUS VEIN (Right) Subjective: Intubated, but awake and alert Denies pain  Objective: Vital signs in last 24 hours: Temp:  [96.1 F (35.6 C)-101.5 F (38.6 C)] 99.7 F (37.6 C) (11/19 0810) Pulse Rate:  [63-96] 63 (11/19 0810) Cardiac Rhythm: Atrial paced (11/19 0800) Resp:  [11-20] 13 (11/19 0810) BP: (104-163)/(60-78) 105/64 (11/19 0800) SpO2:  [92 %-100 %] 98 % (11/19 0810) Arterial Line BP: (94-148)/(39-65) 97/48 (11/19 0800) FiO2 (%):  [50 %-70 %] 50 % (11/19 0810)  Hemodynamic parameters for last 24 hours: PAP: (27-59)/(6-27) 36/18 CVP:  [6 mmHg-45 mmHg] 12 mmHg CO:  [3.7 L/min-5.6 L/min] 3.8 L/min CI:  [1.9 L/min/m2-2.9 L/min/m2] 2 L/min/m2  Intake/Output from previous day: 11/18 0701 - 11/19 0700 In: 2811.1 [I.V.:2139.9; Blood:226; IV Piggyback:445.2] Out: 2280 [Urine:1640; Blood:500; Chest Tube:140] Intake/Output this shift: Total I/O In: 76.2 [I.V.:76.2] Out: 55 [Urine:35; Chest Tube:20]  General appearance: alert, cooperative, and no distress Neurologic: intact Heart: regular rate and rhythm Lungs: clear to auscultation bilaterally Abdomen: normal findings: soft, non-tender  Lab Results: Recent Labs    10/27/21 1956 10/27/21 2036 10/28/21 0242 10/28/21 0508  WBC 11.2*  --  12.4*  --   HGB 11.2*   < > 10.2*  10.7* 11.2*  HCT 34.2*   < > 30.0*  32.6* 33.0*  PLT 179  --  174  --    < > = values in this interval not displayed.   BMET:  Recent Labs    10/27/21 0117 10/27/21 1510 10/27/21 1823 10/27/21 2036 10/28/21 0242 10/28/21 0508  NA 138   < > 141   < > 140  137 140  K 3.3*   < > 4.4   < > 4.1  4.1 4.1  CL 108   < > 106  --  108  --   CO2 23  --   --   --   22  --   GLUCOSE 134*   < > 138*  --  124*  --   BUN 13   < > 13  --  12  --   CREATININE 1.32*   < > 1.10  --  1.25*  --   CALCIUM 9.6  --   --   --  8.5*  --    < > = values in this interval not displayed.    PT/INR:  Recent Labs    10/27/21 1956  LABPROT 17.3*  INR 1.4*   ABG    Component Value Date/Time   PHART 7.333 (L) 10/28/2021 0508   HCO3 19.6 (L) 10/28/2021 0508   TCO2 21 (L) 10/28/2021 0508   ACIDBASEDEF 5.0 (H) 10/28/2021 0508   O2SAT 93.0 10/28/2021 0508   CBG (last 3)  Recent Labs    10/28/21 0701 10/28/21 0812 10/28/21 0813  GLUCAP 122* 68* 82    Assessment/Plan: S/P Procedure(s) (LRB): CORONARY ARTERY BYPASS GRAFTING (CABG) X THREE ON PUMP USING LEFT INTERNAL MAMMARY ARTERY AND RIGHT ENDOSCOPIC GREATER SAPHENOUS VEIN CONDUITS (N/A) TRANSESOPHAGEAL ECHOCARDIOGRAM (TEE) (N/A) APPLICATION OF CELL SAVER ENDOVEIN HARVEST OF GREATER SAPHENOUS VEIN (Right) POD # 1 NEURO- intact CV- in SR, good hemodynamics, normal PA pressure with no V wave  ASA, statin, beta blocker  Dc Swan and A line RESP- Still on vent, down  to 50% FiO2  Wean to extubate RENAL- creatinine 1.25, baseline on 11/15 was 1.35   No need for diuresis yet ENDO- CBG low, dc insulin drip GI- advance diet as tolerated once extubated Anemia secondary to ABL- was transfused in OR SCD + enoxaparin for DVT prophylaxis Mobilize Dc chest tubes   LOS: 1 day    Joshua Wyatt 10/28/2021

## 2021-10-28 NOTE — Op Note (Signed)
NAMERAYMONE, [place] MEDICAL RECORD NO: [id number] ACCOUNT NO: [id number] DATE OF BIRTH: [birth year] FACILITY: [facility] LOCATION: [facility] PHYSICIAN: Revonda Standard. Roxan Hockey, MD  Operative Report   DATE OF PROCEDURE: 10/27/2021  PREOPERATIVE DIAGNOSIS:  Left main and 3-vessel coronary artery disease.  POSTOPERATIVE DIAGNOSIS:  Left main and 3-vessel coronary artery disease.  PROCEDURES PERFORMED:   Median sternotomy, extracorporeal circulation, Coronary artery bypass grafting x 3  Left internal mammary artery to LAD,  Saphenous vein to obtuse marginal,  Saphenous vein to posterior descending  Endoscopic vein harvest right thigh.  SURGEON:  Revonda Standard. Roxan Hockey, MD  ASSISTANT:  Ellwood Handler, PA.  ANESTHESIA:  General.  FINDINGS:  Ejection fraction by TEE approximately 40-45%.  There was mild aortic stenosis and mild mitral regurgitation.  Post-bypass TEE initially showed some lateral hypokinesis and worsening mitral regurgitation, which improved during the course of  the post-bypass period.  Coronary small fair quality targets.  Conduits small fair quality.  CLINICAL NOTE PROCEDURE: Mr. Kimberley is a 75 year old man who has known coronary disease and multiple cardiac risk factors.  He presented with new onset angina.  He underwent catheterization, which revealed a 90% distal left main stenosis as well as  ostial RCA stenosis.  Ejection fraction was about 45%.  He was referred for coronary artery bypass grafting.  He had known mild mitral insufficiency and mild aortic stenosis.  He was advised to undergo coronary artery bypass grafting.  The indications,  risks, benefits, and alternatives were discussed in detail with the patient.  He understood and accepted the risks and agreed to proceed.  OPERATIVE NOTE: Mr. Mena was brought to the preoperative holding area on 10/27/2021.  Anesthesia placed a Swan-Ganz catheter and an arterial blood pressure monitoring line.  He was taken to the operating  room and anesthetized and intubated.  A Foley  catheter was placed.  Intravenous antibiotics were administered.  Transesophageal echocardiography was performed by Dr. Lissa Hoard.  Please refer to his separately dictated note for full details of the procedure.  The chest, abdomen and legs were prepped  and draped in the usual sterile fashion.  Timeout was performed.  A median sternotomy was performed and the left internal mammary artery was harvested using standard technique.  Simultaneously, an incision was made in the medial aspect of the right leg at the level of the knee.  The greater  saphenous vein was harvested endoscopically by Ellwood Handler, PA.  The vein was relatively small caliber and fair quality due to size, but otherwise satisfactory.  2000 units of heparin was administered during the vessel harvest.  The remainder of the full heparin dose was given prior to opening the pericardium.  Both the mammary artery and saphenous vein were relatively small vessels and fair quality based on size.  The remainder of the full heparin dose was given.  The sternal retractor was placed.  Sternum was opened.  The pericardium was opened.  The ascending aorta was inspected.  There was no evidence of atherosclerotic disease.  After confirming adequate anticoagulation with ACT measurement, the aorta was cannulated via concentric 2-0 Ethibond pledgeted pursestring sutures.  A dual stage venous cannula was placed via a pursestring suture in the right atrial appendage.  Cardiopulmonary bypass was initiated.   Flows were maintained per protocol.  The patient was cooled to 32-34 degrees Celsius.  The coronary arteries were inspected and anastomotic sites were chosen.  The conduits were inspected and cut to length.  A foam pad was placed in the  pericardium to  insulate the heart.  A temperature probe was placed in the myocardial septum and a cardioplegia cannula was placed in the ascending aorta.  The aorta was cross  clamped.  The left ventricle was emptied  via the aortic root vent.  Cardiac arrest then was achieved with a combination of cold antegrade blood cardioplegia and topical iced saline and 1 liter of cardioplegia was administered.  There  was a rapid diastolic arrest and there was septal cooling to 11 degrees Celsius.  A reversed saphenous vein graft was placed end-to-side to the posterior descending branch of the right coronary.  This was a 1.5 mm good quality target.  The vein was of fair quality.  It was anastomosed end-to-side with a running 7-0 Prolene suture.  A  probe passed easily proximally and distally.  At the completion of the anastomosis, there was good flow and good hemostasis with cardioplegia administration.  There was a large anterior lateral diagonal branch which was a ramus intermedius equivalent.  There was concern with competitive flow if this branch was grafted as it arose from the LAD.  Therefore, the distal OM was grafted.  This was a small 1 mm fair  quality target vessel.  The vein was small and fair quality.  It was anastomosed end-to-side with a running 7-0 Prolene suture.  A probe did pass proximally and distally at the completion of the anastomosis.  Cardioplegia was administered down the graft.   There was satisfactory flow and good hemostasis.  Additional cardioplegia was also administered via the aortic root.  The left internal mammary artery was brought through a window in the pericardium.  The distal end was bevelled.  The mammary was relatively small, but accepted a 1.5 mm probe and did have excellent flow.  It was anastomosed end-to-side to the distal  LAD.  The distal LAD also was a small vessel, although it also accepted a 1.5 mm probe.  It was only a fair quality target.  The mammary to LAD anastomosis was performed with a running 8-0 Prolene suture.  At the completion of the anastomosis, the  bulldog clamp was removed.  Rapid septal rewarming was noted.  The bulldog  clamp was replaced.  The mammary pedicle was tacked to the epicardial surface of the heart.  Additional cardioplegia was administered.  The vein grafts were cut to length.  The cardioplegia cannula was removed from the ascending aorta.  The proximal vein graft anastomoses were performed to 4.0 mm punch aortotomies with running 6-0 Prolene sutures.  At the completion of the final proximal  anastomosis, the patient was placed in Trendelenburg position.  Lidocaine was administered.  The aortic root was de-aired and the aortic crossclamp was removed.  The total crossclamp time was 58 minutes.  The patient required a single defibrillation with  10 joules and then was in sinus rhythm thereafter.  While rewarming was completed all proximal and distal anastomoses were inspected for hemostasis.  Epicardial pacing wires were placed on the right ventricle and right atrium.  When the patient had rewarmed to a core temperature of 37 degrees Celsius he  was weaned from cardiopulmonary bypass on the first attempt.  He was on a dopamine infusion at 3 mcg per kilogram per minute at the time of separation from bypass.  Total bypass time was 92 minutes.  Initially after weaning from bypass, there was worsened left ventricular function and worsened MR with it being moderate to severe.  PA pressures were initially  in the high 50s over the mid 30s.  The patient was observed and over time his pulmonary  artery pressures normalized and his MR improved, although was still more than he had had on the prebypass study, suspicion was air had embolized through the graft to the OM.  A test dose of protamine was administered and was well tolerated.  The atrial and aortic cannula were removed.  The remainder of the protamine was administered without incident.  The chest was irrigated with warm saline.  Hemostasis was achieved.  The  pericardium was not reapproximated.  The left pleural and mediastinal chest tubes were placed through  separate subcostal incisions.  The sternum was closed with a combination of single and double heavy gauge stainless steel wires.  Pectoralis fascia,  subcutaneous tissue and skin were closed in standard fashion.  All sponge, needle and instrument counts were correct at the end of the procedure, the patient was taken from the operating room to the surgical intensive care unit in good condition.  Ellwood Handler, PA assisted during the case and harvested the saphenous vein independently and closed the leg incisions.  She also provided exposure and retraction during the bypass grafts.   PUS D: 10/27/2021 8:12:21 pm T: 10/28/2021 5:06:00 am  JOB: [id number]/ [id number]

## 2021-10-29 ENCOUNTER — Inpatient Hospital Stay (HOSPITAL_COMMUNITY): Payer: Medicare Other

## 2021-10-29 LAB — CBC
HCT: 30.6 % — ABNORMAL LOW (ref 39.0–52.0)
Hemoglobin: 9.5 g/dL — ABNORMAL LOW (ref 13.0–17.0)
MCH: 29.1 pg (ref 26.0–34.0)
MCHC: 31 g/dL (ref 30.0–36.0)
MCV: 93.9 fL (ref 80.0–100.0)
Platelets: 132 10*3/uL — ABNORMAL LOW (ref 150–400)
RBC: 3.26 MIL/uL — ABNORMAL LOW (ref 4.22–5.81)
RDW: 13 % (ref 11.5–15.5)
WBC: 10.5 10*3/uL (ref 4.0–10.5)
nRBC: 0 % (ref 0.0–0.2)

## 2021-10-29 LAB — GLUCOSE, CAPILLARY
Glucose-Capillary: 100 mg/dL — ABNORMAL HIGH (ref 70–99)
Glucose-Capillary: 100 mg/dL — ABNORMAL HIGH (ref 70–99)
Glucose-Capillary: 146 mg/dL — ABNORMAL HIGH (ref 70–99)
Glucose-Capillary: 123 mg/dL — ABNORMAL HIGH (ref 70–99)
Glucose-Capillary: 115 mg/dL — ABNORMAL HIGH (ref 70–99)

## 2021-10-29 LAB — BASIC METABOLIC PANEL
Anion gap: 5 (ref 5–15)
Anion gap: 9 (ref 5–15)
BUN: 20 mg/dL (ref 8–23)
BUN: 26 mg/dL — ABNORMAL HIGH (ref 8–23)
CO2: 25 mmol/L (ref 22–32)
CO2: 23 mmol/L (ref 22–32)
Calcium: 8.8 mg/dL — ABNORMAL LOW (ref 8.9–10.3)
Calcium: 9.2 mg/dL (ref 8.9–10.3)
Chloride: 107 mmol/L (ref 98–111)
Chloride: 103 mmol/L (ref 98–111)
Creatinine, Ser: 1.76 mg/dL — ABNORMAL HIGH (ref 0.61–1.24)
Creatinine, Ser: 1.84 mg/dL — ABNORMAL HIGH (ref 0.61–1.24)
GFR, Estimated: 40 mL/min — ABNORMAL LOW (ref >60)
GFR, Estimated: 38 mL/min — ABNORMAL LOW (ref >60)
Glucose, Bld: 101 mg/dL — ABNORMAL HIGH (ref 70–99)
Glucose, Bld: 123 mg/dL — ABNORMAL HIGH (ref 70–99)
Potassium: 4 mmol/L (ref 3.5–5.1)
Potassium: 4.1 mmol/L (ref 3.5–5.1)
Sodium: 137 mmol/L (ref 135–145)
Sodium: 135 mmol/L (ref 135–145)

## 2021-10-29 IMAGING — DX DG CHEST 1V PORT
1 series · 1 of 1 positions shown · non-contrast
Comparison: [DATE]

CLINICAL DATA: Status post CABG.

EXAM:
PORTABLE CHEST 1 VIEW

[chest]
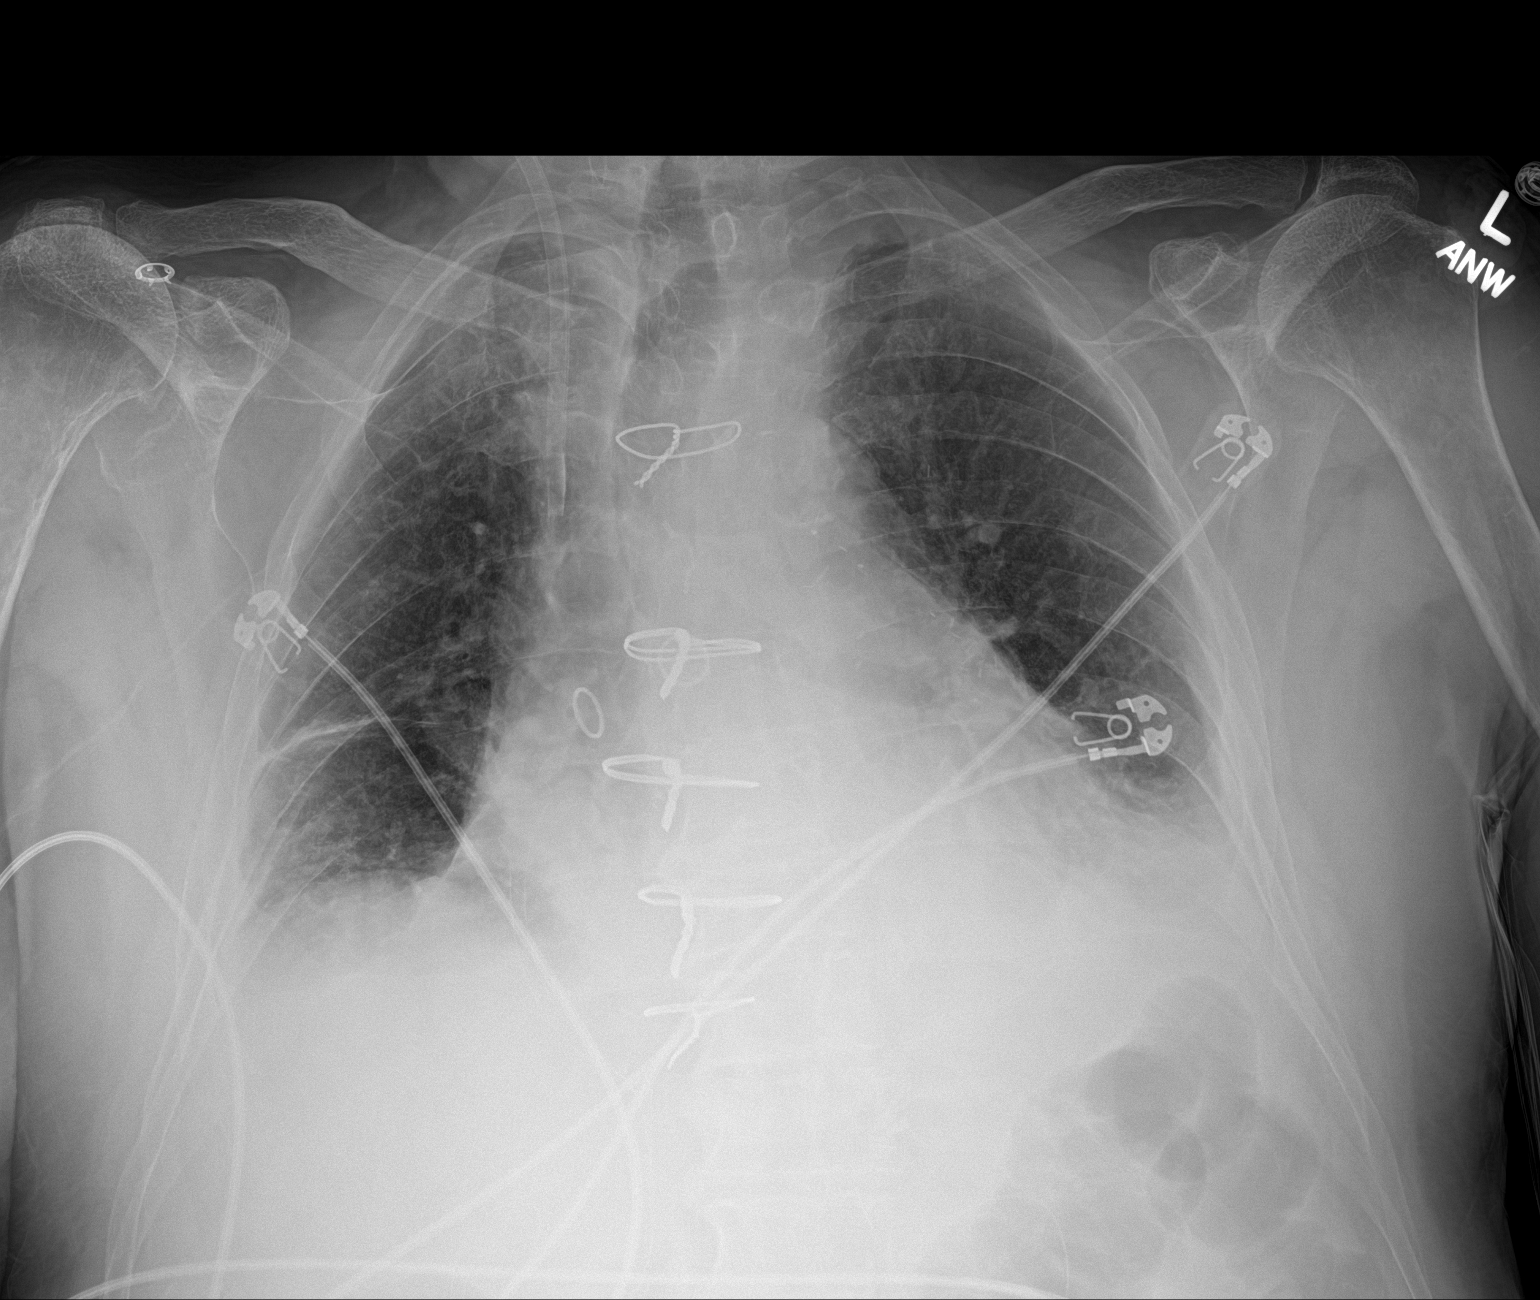

[1 of 1 positions shown; findings below may reference images not displayed]

FINDINGS: [SM] hours. Low lung volumes. The cardio pericardial silhouette is
enlarged. Slight improvement in aeration at the left base with
persistent bibasilar atelectasis and small effusions. Pulmonary
artery catheter is been removed in the interval with right IJ sheath
still visualized in situ. Endotracheal and NG tubes have been
removed. Mediastinal/pericardial drain has been removed.
IMPRESSION: 1. Interval extubation and removal of pulmonary artery catheter,
mediastinal drain, NG tube, and pulmonary artery catheter.
2. Slight improvement in aeration at the left base.

## 2021-10-29 MED ORDER — INSULIN ASPART 100 UNIT/ML IJ SOLN
0.0000 [IU] | Freq: Three times a day (TID) | INTRAMUSCULAR | Status: DC
  Administered 2021-10-29 – 2021-10-31 (×4): 2 [IU] via SUBCUTANEOUS

## 2021-10-29 NOTE — Discharge Summary (Addendum)
[address]       [place] [address]             [phone]    Physician Discharge Summary  Patient ID: Joshua Wyatt MRN: [id number] DOB/AGE: Mar 31, 1946 75 y.o.  Admit date: 10/26/2021 Discharge date: 11/03/2021  Admission Diagnoses:  Patient Active Problem List   Diagnosis Date Noted   CAD (coronary artery disease) 10/26/2021   S/P total knee arthroplasty, left 08/29/2021   Chest pain 05/07/2019   GERD (gastroesophageal reflux disease) 05/07/2019   H/O total knee replacement, right 05/06/2019   Preoperative clearance 12/17/2018   Status post coronary artery stent placement    CAD S/P percutaneous coronary angioplasty 03/13/2018   2nd degree AV block 02/25/2018   Status post left heart catheterization 02/24/2018   Unstable angina (HCC) 02/21/2018   PAD (peripheral artery disease) ([place]) 02/03/2017   Hyponatremia 02/03/2017   CKD (chronic kidney disease) stage 3, GFR 30-59 ml/min (HCC) 02/03/2017   Hypokalemia 01/04/2017   Bilateral carotid artery disease ([place]) 06/17/2015   HTN (hypertension) 04/27/2013   PVD (peripheral vascular disease) ([place]) 03/31/2013   Severe claudication ([place]) 03/31/2013   Mixed hyperlipidemia 03/31/2013   Discharge Diagnoses:  Patient Active Problem List   Diagnosis Date Noted   S/P CABG x 3    CAD (coronary artery disease) 10/26/2021   S/P total knee arthroplasty, left 08/29/2021   Chest pain 05/07/2019   GERD (gastroesophageal reflux disease) 05/07/2019   H/O total knee replacement, right 05/06/2019   Preoperative clearance 12/17/2018   Status post coronary artery stent placement    CAD S/P percutaneous coronary angioplasty 03/13/2018   2nd degree AV block 02/25/2018   Status post left heart catheterization 02/24/2018   Unstable angina ([place]) 02/21/2018   PAD (peripheral artery disease) ([place]) 02/03/2017   Hyponatremia 02/03/2017   CKD (chronic kidney disease) stage 3, GFR 30-59 ml/min (HCC) 02/03/2017    Hypokalemia 01/04/2017   Bilateral carotid artery disease ([place]) 06/17/2015   HTN (hypertension) 04/27/2013   PVD (peripheral vascular disease) ([place]) 03/31/2013   Severe claudication ([place]) 03/31/2013   Mixed hyperlipidemia 03/31/2013  Post-op atrial fibrillation  Discharged Condition: stable  History of Present Illness:  Joshua Wyatt. Joshua Wyatt is a very pleasant 75 year old gentleman with past medical history with known coronary artery disease status post PTCI with stenting of the ostial right coronary artery by Dr. Gwenlyn Found in 2019.  He also has a history of stage III chronic kidney disease, dyslipidemia, hypertension, cerebrovascular disease status post bilateral carotid endarterectomies and subsequent stenting of the right coronary artery by Dr. Gwenlyn Found.  He has had multiple percutaneous procedures to the lower extremities as well.  He has degenerative joint disease and is status post bilateral knee replacements, the most recent being in his left knee about 6 weeks ago.  He is a former 58-pack-year smoker having quit 8 years ago. [name] reports he was fairly active lifestyle after retiring from [employer] several years ago.  He plays golf 2-3 times a week.  He noticed in the last month that we have exertional chest pain while on the golf course.  This would subside with rest and sublingual nitroglycerin.  Most recent episode presenting from completing again.  He contacted his cardiologist to discuss the symptoms and arrangements were made for evaluation with left heart catheterization.  Study was performed earlier this morning by Dr. Gwenlyn Found and demonstrates severe left main coronary artery stenosis and two-vessel coronary artery disease.  The previously placed right coronary artery stent was patent but he appeared to have an 80% ostial RCA stenosis.  There was a 90% distal left main coronary stenosis followed by 40 to 50% since then stenoses in the LAD.  The circumflex coronary system did not have any  significant obstructive disease.  A left ventriculogram was not performed.  He had an echo in September of this year demonstrating left ventricular ejection fraction of 50 to 55%.  There was mild mitral insufficiency but no stenosis.  There was mild aortic stenosis but no aortic insufficiency.  There was no abnormalities of the proximal aorta.  Cardiothoracic surgery consultation was requested.  He was evaluated by Dr. Roxan Hockey who felt patient would benefit from coronary artery bypass grafting procedure.  The risks and benefits of the procedure were explained to the patient and he was agreeable to proceed.  Hospital Course:  Patient remained chest pain free prior to surgery.  He was taken to the operating room on 10/27/2021 and underwent CABG x 3 utilizing LIMA to LAD, SVG to OM, and SVG to PDA.  He also underwent endoscopic harvest of greater saphenous vein from his right leg.  He had operative anemia and required 2 units of packed cells during his surgical procedure.  He tolerated the procedure without difficulty and was taken to the SICU in stable condition.  He was weaned an extubated on POD #1.  He underwent debridement of chest tubes and arterial lines without difficulty.  The patient developed tachycardia and hypertension. His Lopressor was increased as hemodynamics allowed.  The patient developed AKI with creatinine level peaking at 1.89.  He did have  a fib post op. He was put on Amiodarone. He converted to sinus rhythm and was transitioned to oral Amiodarone on 11/22. He was gradually diuresed as creatinine continued to improve. He was weaned off the Insulin drip. His pre op HGA1C was 5.7. Accu checks and SS PRN will be stopped after transfer. Epicardial pacing wires were removed on 11/23. He continued to have paroxysmal atrial fibrillation with controlled ventricular rates. For this reason he was anticoagulated with Eliquis on the day of discharge. Prior to discharge, he was ambulating  independently and tolerating a cardiac diet.  He was maintaining adequate O2 saturations on room air. His incisions were healing with no sing of complication.   Consults: cardiology  Significant Diagnostic Studies: angiography:     Ost RCA lesion is 80% stenosed.   Prox RCA to Mid RCA lesion is 40% stenosed.   Mid LM to Dist LM lesion is 90% stenosed.   Ost LAD to Mid LAD lesion is 50% stenosed.   Previously placed Ost RCA to Prox RCA stent (unknown type) is  widely patent.  Treatments: surgery:  Operative Report    DATE OF PROCEDURE: 10/27/2021   PREOPERATIVE DIAGNOSIS:  Left main and 3-vessel coronary artery disease.   POSTOPERATIVE DIAGNOSIS:  Left main and 3-vessel coronary artery disease.   PROCEDURES PERFORMED:  Median sternotomy, extracorporeal circulation, coronary artery bypass grafting x3 (left internal mammary artery to LAD, saphenous vein to obtuse marginal, saphenous vein to posterior descending) endoscopic vein harvest right thigh.   SURGEON:  Revonda Standard. Roxan Hockey, MD   ASSISTANT:  Ellwood Handler, PA-C  Discharge Exam: Blood pressure (!) 150/71, pulse 66, temperature 98.6 F (37 C), temperature source Oral, resp. rate 20, height 5' 7.5" (1.715 m), weight 76.3 kg, SpO2 96 %.  Cardiovascular: SR with  brief periods of PAF last evening Pulmonary:clear,  diminished bilateral breath sounds Abdomen: Soft, non tender, bowel sounds present. Extremities: No lower extremity edema. Wounds: Clean and dry.  No erythema or signs of infection.   Discharge Medications:  The patient has been discharged on:   1.Beta Blocker:  Yes [ X  ]                              No   [   ]                              If No, reason:  2.Ace Inhibitor/ARB: Yes [   ]                                     No  [  x  ]                                     If No, reason: titrating beta blocker  3.Statin:   Yes [   ]                  No  [ X  ]                  If No, reason:  drug intolerance,  on Repatha  4.Shela CommonsVelta Addison  [ X  ]                  No   [   ]                  If No, reason:  Patient had ACS upon admission:  Plavix/P2Y12 inhibitor: Yes [   ]                                      No  [ x  ]      Allergies as of 11/03/2021       Reactions   Ezetimibe    leg pains   Statins Swelling, Other (See Comments)   Muscle pain, also        Medication List     STOP taking these medications    amLODipine 5 MG tablet Commonly known as: NORVASC   HYDROcodone-acetaminophen 5-325 MG tablet Commonly known as: NORCO/VICODIN   irbesartan 150 MG tablet Commonly known as: AVAPRO       TAKE these medications    acetaminophen 500 MG tablet Commonly known as: TYLENOL Take 500-1,000 mg by mouth every 6 (six) hours as needed (for pain).   amiodarone 200 MG tablet Commonly known as: PACERONE Take 2 tablets (400 mg total) by mouth 2 (two) times daily. For 3 days then decrease the dose to 1 tablet ($RemoveB'200mg'[id number]$ ) by mouth twice daily.   apixaban 5 MG Tabs tablet Commonly known as: ELIQUIS Take 1 tablet (5 mg total) by mouth 2 (two) times daily.   aspirin EC 81 MG tablet Take 162 mg by mouth in the morning. Swallow whole.   docusate sodium 100 MG capsule Commonly known as: COLACE Take 1 capsule (100 mg total) by mouth 2 (two) times daily.   ezetimibe 10 MG tablet  Commonly known as: ZETIA Take 1 tablet by mouth once daily   magnesium hydroxide 400 MG/5ML suspension Commonly known as: MILK OF MAGNESIA Take 15-30 mLs by mouth daily as needed for mild constipation.   methocarbamol 500 MG tablet Commonly known as: ROBAXIN Take 1 tablet (500 mg total) by mouth every 6 (six) hours as needed for muscle spasms.   metoprolol tartrate 50 MG tablet Commonly known as: LOPRESSOR Take 1 tablet (50 mg total) by mouth 2 (two) times daily.   nitroGLYCERIN 0.4 MG SL tablet Commonly known as: NITROSTAT PLACE 1 TABLET UNDER THE TONGUE  EVERY  5  MINUTES  AS  NEEDED  FOR   CHEST  PAIN   oxyCODONE 5 MG immediate release tablet Commonly known as: Oxy IR/ROXICODONE Take 1 tablet (5 mg total) by mouth every 4 (four) hours as needed for up to 5 days for severe pain.   pantoprazole 40 MG tablet Commonly known as: PROTONIX Take 1 tablet by mouth once daily   polyethylene glycol 17 g packet Commonly known as: MIRALAX / GLYCOLAX Take 17 g by mouth daily as needed for mild constipation.   Repatha Pushtronex System 420 MG/3.5ML Soct Generic drug: Evolocumab with Infusor Inject 420 mg into the skin every 30 (thirty) days.   tamsulosin 0.4 MG Caps capsule Commonly known as: FLOMAX Take 0.4 mg by mouth at bedtime.        Follow-up Information     Lorretta Harp, MD. Go on 11/10/2021.   Specialties: Cardiology, Radiology Why: Appointment time is at 11:00 am Contact information: [address] [place] [address] [phone]         [name], MD. Go on 11/28/2021.   Specialty: Cardiothoracic Surgery Why: PA/LAT CXR to be taken (at [facility] which is in the same building as Dr. Leonarda Salon office) on 12/20 at 2:15 pm;Appointment time is at 2:45 pm Contact information: [place] [address] [phone]                 Signed: Antony Odea, PA-C   11/03/2021, 9:48 AM

## 2021-10-29 NOTE — Progress Notes (Signed)
2 Days Post-Op Procedure(s) (LRB): CORONARY ARTERY BYPASS GRAFTING (CABG) X THREE ON PUMP USING LEFT INTERNAL MAMMARY ARTERY AND RIGHT ENDOSCOPIC GREATER SAPHENOUS VEIN CONDUITS (N/A) TRANSESOPHAGEAL ECHOCARDIOGRAM (TEE) (N/A) APPLICATION OF CELL SAVER ENDOVEIN HARVEST OF GREATER SAPHENOUS VEIN (Right) Subjective: Feels better today  Objective: Vital signs in last 24 hours: Temp:  [97.5 F (36.4 C)-98.2 F (36.8 C)] 98.1 F (36.7 C) (11/20 1200) Pulse Rate:  [37-121] 78 (11/20 1600) Cardiac Rhythm: Normal sinus rhythm (11/20 1600) Resp:  [10-22] 17 (11/20 1600) BP: (97-145)/(55-88) 124/70 (11/20 1600) SpO2:  [89 %-95 %] 95 % (11/20 1600)  Hemodynamic parameters for last 24 hours:    Intake/Output from previous day: 11/19 0701 - 11/20 0700 In: 1399.1 [P.O.:960; I.V.:289.1; IV Piggyback:150] Out: 530 [Urine:510; Chest Tube:20] Intake/Output this shift: Total I/O In: 480 [P.O.:480] Out: 400 [Urine:400]  General appearance: alert, cooperative, and no distress Neurologic: intact Heart: regular rate and rhythm Lungs: diminished breath sounds bibasilar Abdomen: normal findings: soft, non-tender  Lab Results: Recent Labs    10/28/21 1700 10/29/21 0420  WBC 13.6* 10.5  HGB 11.3* 9.5*  HCT 35.7* 30.6*  PLT 175 132*   BMET:  Recent Labs    10/28/21 1700 10/29/21 0420  NA 138 137  K 4.0 4.0  CL 107 107  CO2 23 25  GLUCOSE 140* 101*  BUN 15 20  CREATININE 1.57* 1.76*  CALCIUM 8.8* 8.8*    PT/INR:  Recent Labs    10/27/21 1956  LABPROT 17.3*  INR 1.4*   ABG    Component Value Date/Time   PHART 7.349 (L) 10/28/2021 1126   HCO3 22.3 10/28/2021 1126   TCO2 24 10/28/2021 1126   ACIDBASEDEF 3.0 (H) 10/28/2021 1126   O2SAT 95.0 10/28/2021 1126   CBG (last 3)  Recent Labs    10/29/21 0406 10/29/21 0726 10/29/21 1128  GLUCAP 100* 100* 146*    Assessment/Plan: S/P Procedure(s) (LRB): CORONARY ARTERY BYPASS GRAFTING (CABG) X THREE ON PUMP USING LEFT  INTERNAL MAMMARY ARTERY AND RIGHT ENDOSCOPIC GREATER SAPHENOUS VEIN CONDUITS (N/A) TRANSESOPHAGEAL ECHOCARDIOGRAM (TEE) (N/A) APPLICATION OF CELL SAVER ENDOVEIN HARVEST OF GREATER SAPHENOUS VEIN (Right) POD # 2 Overall looks well CV- in SR, BP OK  ASA, statin, beta blocker RESP_ IS  RENAL- creatinine elevated at 1.76, up from 1.57, baseline 1.6 on 08/17/21  Monitor GI- tolerating diet ENDO- CBG ok- change SSI to [facility] and HS Anemia secondary to ABL- mild, follow Thrombocytopenia- mild, follow SCD + enoxaparin for DVT prophylaxis Continue cardiac rehab  LOS: 2 days    Joshua Wyatt 10/29/2021

## 2021-10-30 ENCOUNTER — Encounter (HOSPITAL_COMMUNITY): Payer: Self-pay | Admitting: Thoracic Surgery (Cardiothoracic Vascular Surgery)

## 2021-10-30 ENCOUNTER — Inpatient Hospital Stay (HOSPITAL_COMMUNITY): Payer: Medicare Other

## 2021-10-30 LAB — CBC
HCT: 31.5 % — ABNORMAL LOW (ref 39.0–52.0)
Hemoglobin: 10 g/dL — ABNORMAL LOW (ref 13.0–17.0)
MCH: 29.5 pg (ref 26.0–34.0)
MCHC: 31.7 g/dL (ref 30.0–36.0)
MCV: 92.9 fL (ref 80.0–100.0)
Platelets: 143 10*3/uL — ABNORMAL LOW (ref 150–400)
RBC: 3.39 MIL/uL — ABNORMAL LOW (ref 4.22–5.81)
RDW: 12.9 % (ref 11.5–15.5)
WBC: 10.4 10*3/uL (ref 4.0–10.5)
nRBC: 0 % (ref 0.0–0.2)

## 2021-10-30 LAB — TYPE AND SCREEN
ABO/RH(D): A POS
Antibody Screen: NEGATIVE
Unit division: 0
Unit division: 0
Unit division: 0
Unit division: 0
Unit division: 0
Unit division: 0

## 2021-10-30 LAB — BASIC METABOLIC PANEL
Anion gap: 8 (ref 5–15)
BUN: 30 mg/dL — ABNORMAL HIGH (ref 8–23)
CO2: 24 mmol/L (ref 22–32)
Calcium: 9.3 mg/dL (ref 8.9–10.3)
Chloride: 104 mmol/L (ref 98–111)
Creatinine, Ser: 1.89 mg/dL — ABNORMAL HIGH (ref 0.61–1.24)
GFR, Estimated: 36 mL/min — ABNORMAL LOW (ref >60)
Glucose, Bld: 99 mg/dL (ref 70–99)
Potassium: 4.3 mmol/L (ref 3.5–5.1)
Sodium: 136 mmol/L (ref 135–145)

## 2021-10-30 LAB — BPAM RBC
Blood Product Expiration Date: 202212092359
Blood Product Expiration Date: 202212092359
Blood Product Expiration Date: 202212092359
Blood Product Expiration Date: 202212132359
Blood Product Expiration Date: 202212132359
Blood Product Expiration Date: 202212132359
ISSUE DATE / TIME: 202211181516
ISSUE DATE / TIME: 202211181516
ISSUE DATE / TIME: 202211190837
ISSUE DATE / TIME: 202211191106
Unit Type and Rh: 6200
Unit Type and Rh: 6200
Unit Type and Rh: 6200
Unit Type and Rh: 6200
Unit Type and Rh: 6200
Unit Type and Rh: 6200

## 2021-10-30 LAB — GLUCOSE, CAPILLARY
Glucose-Capillary: 89 mg/dL (ref 70–99)
Glucose-Capillary: 124 mg/dL — ABNORMAL HIGH (ref 70–99)
Glucose-Capillary: 111 mg/dL — ABNORMAL HIGH (ref 70–99)
Glucose-Capillary: 117 mg/dL — ABNORMAL HIGH (ref 70–99)

## 2021-10-30 LAB — COOXEMETRY PANEL
Carboxyhemoglobin: 0.7 % (ref 0.5–1.5)
Methemoglobin: 0.9 % (ref 0.0–1.5)
O2 Saturation: 52.9 %
Total hemoglobin: 10.4 g/dL — ABNORMAL LOW (ref 12.0–16.0)

## 2021-10-30 IMAGING — DX DG CHEST 1V PORT
1 series · 1 of 1 positions shown · non-contrast
Comparison: [DATE].

CLINICAL DATA: Status post coronary bypass graft.

EXAM:
PORTABLE CHEST 1 VIEW

[chest ap]
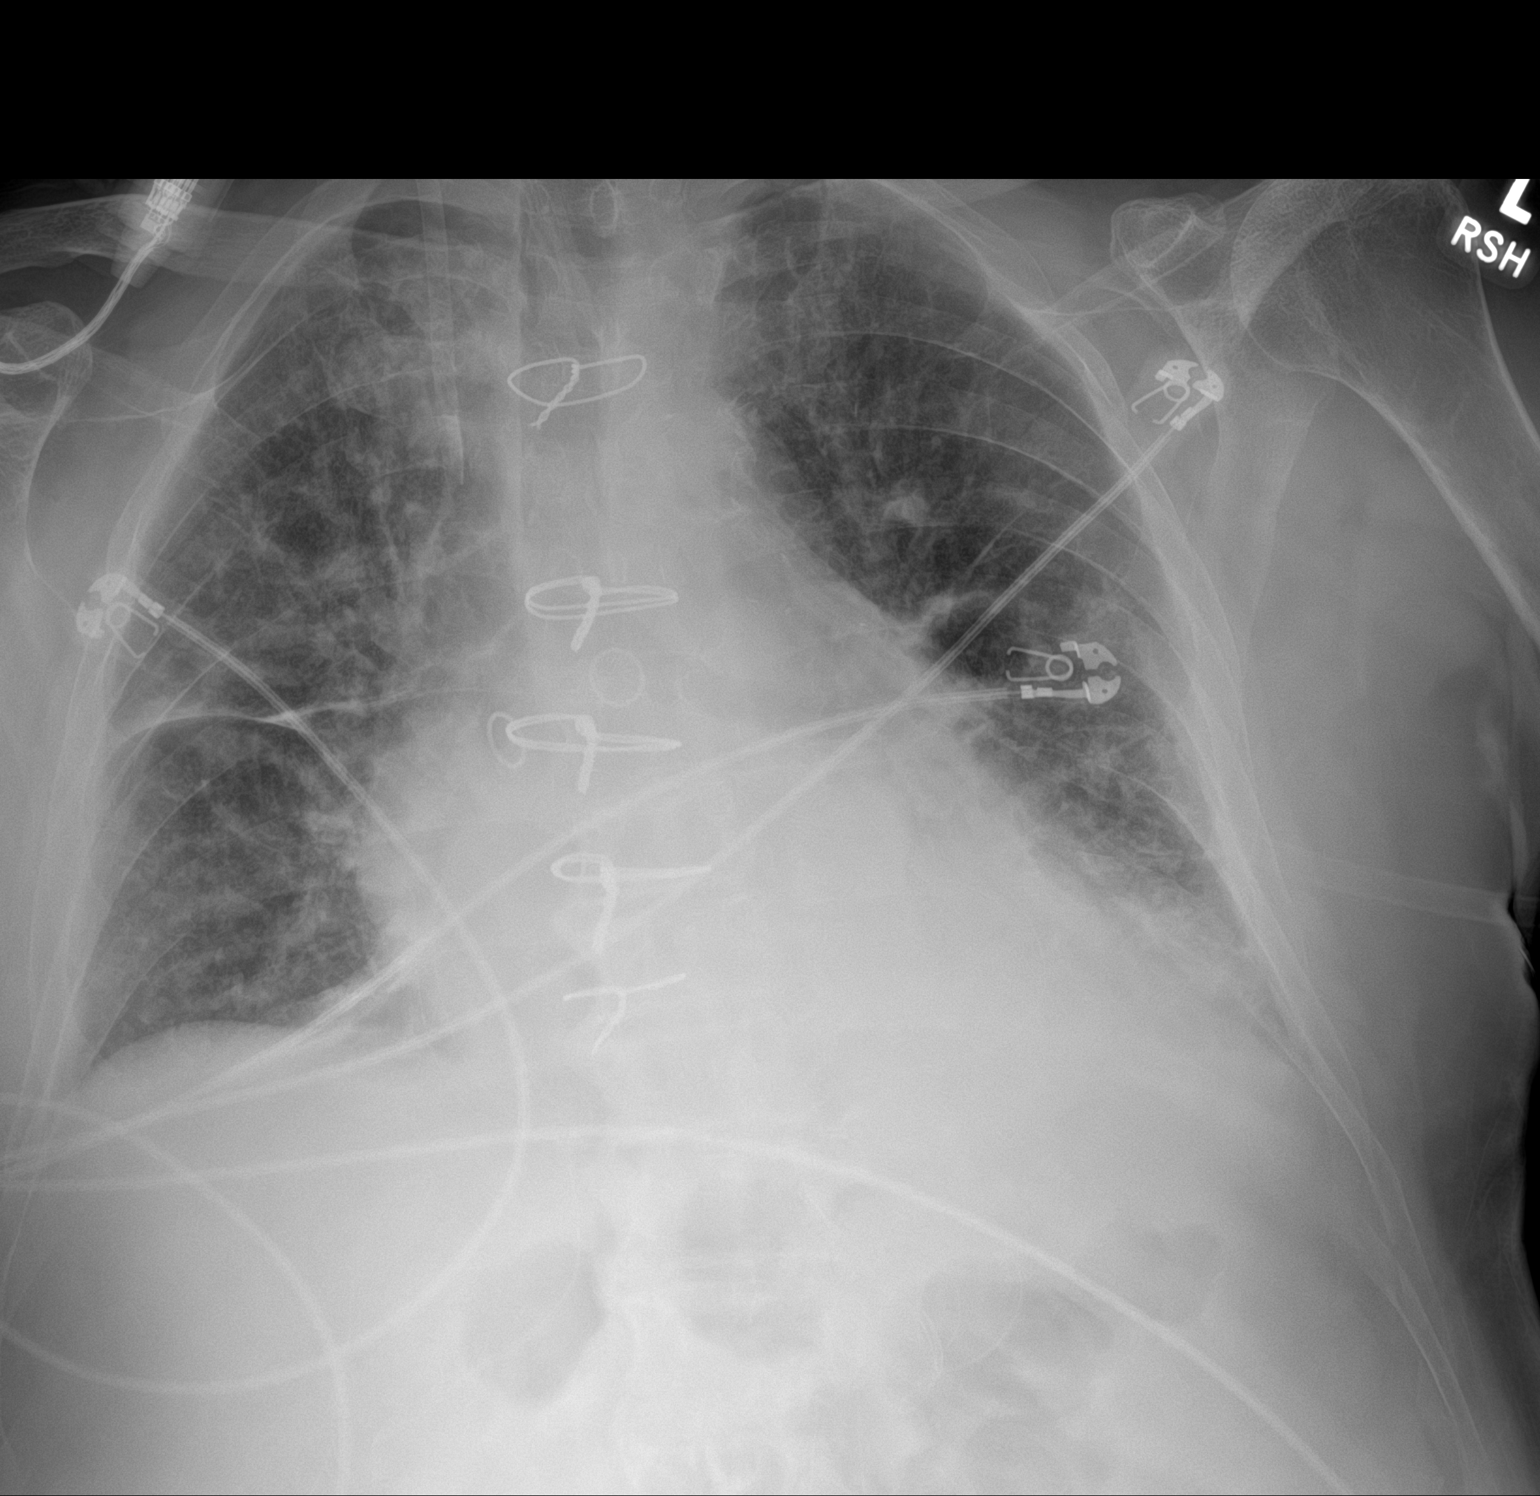

[1 of 1 positions shown; findings below may reference images not displayed]

FINDINGS: Stable cardiomegaly. Right internal jugular venous sheath remains.
No pneumothorax is noted. Bilateral interstitial densities are noted
concerning for atelectasis or possibly edema. Bony thorax is
unremarkable.
IMPRESSION: Increased bilateral pulmonary edema or atelectasis.

## 2021-10-30 MED ORDER — FUROSEMIDE 10 MG/ML IJ SOLN
40.0000 mg | Freq: Two times a day (BID) | INTRAMUSCULAR | Status: AC
  Administered 2021-10-30 (×2): 40 mg via INTRAVENOUS
  Filled 2021-10-30 (×2): qty 4

## 2021-10-30 MED ORDER — AMIODARONE HCL IN DEXTROSE 360-4.14 MG/200ML-% IV SOLN
INTRAVENOUS | Status: AC
  Administered 2021-10-30: 150 mg via INTRAVENOUS
  Filled 2021-10-30: qty 200

## 2021-10-30 MED ORDER — AMIODARONE HCL IN DEXTROSE 360-4.14 MG/200ML-% IV SOLN
60.0000 mg/h | INTRAVENOUS | Status: AC
  Administered 2021-10-30: 60 mg/h via INTRAVENOUS
  Filled 2021-10-30: qty 200

## 2021-10-30 MED ORDER — LEVALBUTEROL HCL 0.63 MG/3ML IN NEBU
0.6300 mg | INHALATION_SOLUTION | Freq: Three times a day (TID) | RESPIRATORY_TRACT | Status: AC
  Administered 2021-10-30 (×2): 0.63 mg via RESPIRATORY_TRACT
  Filled 2021-10-30 (×3): qty 3

## 2021-10-30 MED ORDER — AMIODARONE LOAD VIA INFUSION
150.0000 mg | Freq: Once | INTRAVENOUS | Status: AC
  Filled 2021-10-30: qty 83.34

## 2021-10-30 MED ORDER — AMIODARONE HCL IN DEXTROSE 360-4.14 MG/200ML-% IV SOLN
30.0000 mg/h | INTRAVENOUS | Status: AC
  Administered 2021-10-30 – 2021-10-31 (×2): 30 mg/h via INTRAVENOUS
  Filled 2021-10-30 (×2): qty 200

## 2021-10-30 MED FILL — Heparin Sod (Porcine)-NaCl IV Soln 1000 Unit/500ML-0.9%: INTRAVENOUS | Qty: 1000 | Status: AC

## 2021-10-30 NOTE — Progress Notes (Signed)
3 Days Post-Op Procedure(s) (LRB): CORONARY ARTERY BYPASS GRAFTING (CABG) X THREE ON PUMP USING LEFT INTERNAL MAMMARY ARTERY AND RIGHT ENDOSCOPIC GREATER SAPHENOUS VEIN CONDUITS (N/A) TRANSESOPHAGEAL ECHOCARDIOGRAM (TEE) (N/A) APPLICATION OF CELL SAVER ENDOVEIN HARVEST OF GREATER SAPHENOUS VEIN (Right) Subjective: Up in chair, no compliants Denies pain, SOB  Objective: Vital signs in last 24 hours: Temp:  [97.5 F (36.4 C)-98.4 F (36.9 C)] 98.2 F (36.8 C) (11/21 0756) Pulse Rate:  [37-121] 72 (11/21 0757) Cardiac Rhythm: Normal sinus rhythm;Bundle branch block (11/21 0400) Resp:  [11-24] 17 (11/21 0757) BP: (110-169)/(55-95) 140/67 (11/21 0757) SpO2:  [84 %-97 %] 96 % (11/21 0757) Weight:  [78.8 kg] 78.8 kg (11/21 0500)  Hemodynamic parameters for last 24 hours:    Intake/Output from previous day: 11/20 0701 - 11/21 0700 In: 480 [P.O.:480] Out: 800 [Urine:800] Intake/Output this shift: Total I/O In: 360 [P.O.:360] Out: -   General appearance: alert, cooperative, and no distress Neurologic: intact Heart: regular rate and rhythm Lungs: wheezes faint bilateral Abdomen: normal findings: soft, non-tender  Lab Results: Recent Labs    10/29/21 0420 10/30/21 0320  WBC 10.5 10.4  HGB 9.5* 10.0*  HCT 30.6* 31.5*  PLT 132* 143*   BMET:  Recent Labs    10/29/21 1624 10/30/21 0320  NA 135 136  K 4.1 4.3  CL 103 104  CO2 23 24  GLUCOSE 123* 99  BUN 26* 30*  CREATININE 1.84* 1.89*  CALCIUM 9.2 9.3    PT/INR:  Recent Labs    10/27/21 1956  LABPROT 17.3*  INR 1.4*   ABG    Component Value Date/Time   PHART 7.349 (L) 10/28/2021 1126   HCO3 22.3 10/28/2021 1126   TCO2 24 10/28/2021 1126   ACIDBASEDEF 3.0 (H) 10/28/2021 1126   O2SAT 95.0 10/28/2021 1126   CBG (last 3)  Recent Labs    10/29/21 1629 10/29/21 2121 10/30/21 0631  GLUCAP 123* 115* 89    Assessment/Plan: S/P Procedure(s) (LRB): CORONARY ARTERY BYPASS GRAFTING (CABG) X THREE ON PUMP  USING LEFT INTERNAL MAMMARY ARTERY AND RIGHT ENDOSCOPIC GREATER SAPHENOUS VEIN CONDUITS (N/A) TRANSESOPHAGEAL ECHOCARDIOGRAM (TEE) (N/A) APPLICATION OF CELL SAVER ENDOVEIN HARVEST OF GREATER SAPHENOUS VEIN (Right) POD # 3 Increased O2 requirement overnight, now on 15L [place] NEURO- intact CV- in SR with PACs on Lopressor RESP- increased O2 overnight, 15L is probably overreaction, CXR shows improved basilar atelectasis but looks wet  IS, nebs, diurese, wean O2 RENAL- creatinine remains elevated (1.6) baseline  Volume overloaded, IV diuresis today ENDO- CBG well controlled SCD + enoxaparin for DVT prophylaxis   LOS: 3 days    Joshua Wyatt 10/30/2021

## 2021-10-30 NOTE — Progress Notes (Signed)
EVENING ROUNDS NOTE :     [address]       ,[place] [address]             [phone]                 3 Days Post-Op Procedure(s) (LRB): CORONARY ARTERY BYPASS GRAFTING (CABG) X THREE ON PUMP USING LEFT INTERNAL MAMMARY ARTERY AND RIGHT ENDOSCOPIC GREATER SAPHENOUS VEIN CONDUITS (N/A) TRANSESOPHAGEAL ECHOCARDIOGRAM (TEE) (N/A) APPLICATION OF CELL SAVER ENDOVEIN HARVEST OF GREATER SAPHENOUS VEIN (Right)   Total Length of Stay:  LOS: 3 days  Events:    Ambulated today Afib earlier Back in sinus     BP (!) 109/58 (BP Location: Right Arm)   Pulse 65   Temp 97.9 F (36.6 C) (Oral)   Resp 20   Ht 5' 7.5" (1.715 m)   Wt 78.8 kg   SpO2 94%   BMI 26.81 kg/m          sodium chloride Stopped (10/28/21 1228)   sodium chloride     sodium chloride 10 mL/hr at 10/27/21 1930   amiodarone 60 mg/hr (10/30/21 1500)   Followed by   amiodarone     lactated ringers     lactated ringers Stopped (10/28/21 0914)   lactated ringers Stopped (10/28/21 1114)   promethazine (PHENERGAN) injection (IM or IVPB)      I/O last 3 completed shifts: In: 720 [P.O.:720] Out: 1075 [Urine:1075]   CBC Latest Ref Rng & Units 10/30/2021 10/29/2021 10/28/2021  WBC 4.0 - 10.5 K/uL 10.4 10.5 13.6(H)  Hemoglobin 13.0 - 17.0 g/dL 10.0(L) 9.5(L) 11.3(L)  Hematocrit 39.0 - 52.0 % 31.5(L) 30.6(L) 35.7(L)  Platelets 150 - 400 K/uL 143(L) 132(L) 175    BMP Latest Ref Rng & Units 10/30/2021 10/29/2021 10/29/2021  Glucose 70 - 99 mg/dL 99 123(H) 101(H)  BUN 8 - 23 mg/dL 30(H) 26(H) 20  Creatinine 0.61 - 1.24 mg/dL 1.89(H) 1.84(H) 1.76(H)  BUN/Creat Ratio 10 - 24 - - -  Sodium 135 - 145 mmol/L 136 135 137  Potassium 3.5 - 5.1 mmol/L 4.3 4.1 4.0  Chloride 98 - 111 mmol/L 104 103 107  CO2 22 - 32 mmol/L $RemoveB'24 23 25  'BWJiGsmP$ Calcium 8.9 - 10.3 mg/dL 9.3 9.2 8.8(L)    ABG    Component Value Date/Time   PHART 7.349 (L) 10/28/2021 1126   PCO2ART 40.5 10/28/2021 1126   PO2ART 78 (L)  10/28/2021 1126   HCO3 22.3 10/28/2021 1126   TCO2 24 10/28/2021 1126   ACIDBASEDEF 3.0 (H) 10/28/2021 1126   O2SAT 52.9 10/30/2021 [place], MD 10/30/2021 5:40 PM

## 2021-10-30 NOTE — Progress Notes (Signed)
  Amiodarone Drug - Drug Interaction Consult Note  Recommendations: -Watch QTc with reglan   Amiodarone is metabolized by the cytochrome P450 system and therefore has the potential to cause many drug interactions. Amiodarone has an average plasma half-life of 50 days (range 20 to 100 days).   There is potential for drug interactions to occur several weeks or months after stopping treatment and the onset of drug interactions may be slow after initiating amiodarone.   '[]'$  Statins: Increased risk of myopathy. Simvastatin- restrict dose to $Remov'20mg'HoYUxn$  daily. Other statins: counsel patients to report any muscle pain or weakness immediately.  $RemoveBefor'[]'PbtfEhOwXbQU$  Anticoagulants: Amiodarone can increase anticoagulant effect. Consider warfarin dose reduction. Patients should be monitored closely and the dose of anticoagulant altered accordingly, remembering that amiodarone levels take several weeks to stabilize.  $RemoveBef'[]'fhtJEGtnWg$  Antiepileptics: Amiodarone can increase plasma concentration of phenytoin, the dose should be reduced. Note that small changes in phenytoin dose can result in large changes in levels. Monitor patient and counsel on signs of toxicity.  $RemoveBe'[x]'cSHRLXxdl$  Beta blockers: increased risk of bradycardia, AV block and myocardial depression. Sotalol - avoid concomitant use.  $Rem'[]'rxbq$   Calcium channel blockers (diltiazem and verapamil): increased risk of bradycardia, AV block and myocardial depression.  $RemoveBefo'[]'kpIUorvjIpb$   Cyclosporine: Amiodarone increases levels of cyclosporine. Reduced dose of cyclosporine is recommended.  $RemoveBefor'[]'shMXAOwEDiGY$  Digoxin dose should be halved when amiodarone is started.  $RemoveB'[]'aoPkyVFl$  Diuretics: increased risk of cardiotoxicity if hypokalemia occurs.  $Remove'[]'rhOUiJX$  Oral hypoglycemic agents (glyburide, glipizide, glimepiride): increased risk of hypoglycemia. Patient's glucose levels should be monitored closely when initiating amiodarone therapy.   '[x]'$  Drugs that prolong the QT interval:  Torsades de pointes risk may be increased with concurrent use - avoid if  possible.  Monitor QTc, also keep magnesium/potassium WNL if concurrent therapy can't be avoided.  Antibiotics: e.g. fluoroquinolones, erythromycin.  Antiarrhythmics: e.g. quinidine, procainamide, disopyramide, sotalol.  Antipsychotics: e.g. phenothiazines, haloperidol.   Lithium, tricyclic antidepressants, and methadone. Thank Concha Pyo  10/30/2021 10:50 AM

## 2021-10-31 ENCOUNTER — Inpatient Hospital Stay (HOSPITAL_COMMUNITY): Payer: Medicare Other

## 2021-10-31 LAB — BASIC METABOLIC PANEL
Anion gap: 11 (ref 5–15)
BUN: 33 mg/dL — ABNORMAL HIGH (ref 8–23)
CO2: 23 mmol/L (ref 22–32)
Calcium: 9.1 mg/dL (ref 8.9–10.3)
Chloride: 100 mmol/L (ref 98–111)
Creatinine, Ser: 1.62 mg/dL — ABNORMAL HIGH (ref 0.61–1.24)
GFR, Estimated: 43 mL/min — ABNORMAL LOW (ref >60)
Glucose, Bld: 189 mg/dL — ABNORMAL HIGH (ref 70–99)
Potassium: 3.6 mmol/L (ref 3.5–5.1)
Sodium: 134 mmol/L — ABNORMAL LOW (ref 135–145)

## 2021-10-31 LAB — GLUCOSE, CAPILLARY
Glucose-Capillary: 103 mg/dL — ABNORMAL HIGH (ref 70–99)
Glucose-Capillary: 126 mg/dL — ABNORMAL HIGH (ref 70–99)
Glucose-Capillary: 109 mg/dL — ABNORMAL HIGH (ref 70–99)
Glucose-Capillary: 101 mg/dL — ABNORMAL HIGH (ref 70–99)

## 2021-10-31 LAB — CBC
HCT: 31.5 % — ABNORMAL LOW (ref 39.0–52.0)
Hemoglobin: 10 g/dL — ABNORMAL LOW (ref 13.0–17.0)
MCH: 29.1 pg (ref 26.0–34.0)
MCHC: 31.7 g/dL (ref 30.0–36.0)
MCV: 91.6 fL (ref 80.0–100.0)
Platelets: 167 10*3/uL (ref 150–400)
RBC: 3.44 MIL/uL — ABNORMAL LOW (ref 4.22–5.81)
RDW: 12.8 % (ref 11.5–15.5)
WBC: 9.3 10*3/uL (ref 4.0–10.5)
nRBC: 0 % (ref 0.0–0.2)

## 2021-10-31 MED ORDER — LEVALBUTEROL HCL 0.63 MG/3ML IN NEBU
0.6300 mg | INHALATION_SOLUTION | Freq: Three times a day (TID) | RESPIRATORY_TRACT | Status: AC
  Administered 2021-10-31: 0.63 mg via RESPIRATORY_TRACT
  Filled 2021-10-31: qty 3

## 2021-10-31 MED ORDER — FUROSEMIDE 10 MG/ML IJ SOLN
40.0000 mg | Freq: Two times a day (BID) | INTRAMUSCULAR | Status: AC
  Administered 2021-10-31 (×2): 40 mg via INTRAVENOUS
  Filled 2021-10-31 (×2): qty 4

## 2021-10-31 MED ORDER — AMIODARONE HCL 200 MG PO TABS
400.0000 mg | ORAL_TABLET | Freq: Two times a day (BID) | ORAL | Status: DC
  Administered 2021-10-31 – 2021-11-03 (×7): 400 mg via ORAL
  Filled 2021-10-31 (×7): qty 2

## 2021-10-31 MED ORDER — POLYETHYLENE GLYCOL 3350 17 G PO PACK
17.0000 g | PACK | Freq: Every day | ORAL | Status: DC | PRN
  Administered 2021-10-31: 17 g via ORAL
  Filled 2021-10-31: qty 1

## 2021-10-31 MED ORDER — POTASSIUM CHLORIDE CRYS ER 20 MEQ PO TBCR
20.0000 meq | EXTENDED_RELEASE_TABLET | ORAL | Status: AC
  Administered 2021-10-31 (×3): 20 meq via ORAL
  Filled 2021-10-31 (×3): qty 1

## 2021-10-31 MED FILL — Lidocaine HCl (Cardiac) IV PF Soln 100 MG/5ML (2%): INTRAVENOUS | Qty: 5 | Status: AC

## 2021-10-31 MED FILL — Electrolyte-R (PH 7.4) Solution: INTRAVENOUS | Qty: 3000 | Status: AC

## 2021-10-31 MED FILL — Mannitol IV Soln 20%: INTRAVENOUS | Qty: 500 | Status: AC

## 2021-10-31 MED FILL — Heparin Sodium (Porcine) Inj 1000 Unit/ML: Qty: 1000 | Status: AC

## 2021-10-31 MED FILL — Heparin Sodium (Porcine) Inj 1000 Unit/ML: INTRAMUSCULAR | Qty: 20 | Status: AC

## 2021-10-31 MED FILL — Potassium Chloride Inj 2 mEq/ML: INTRAVENOUS | Qty: 40 | Status: AC

## 2021-10-31 MED FILL — Sodium Chloride IV Soln 0.9%: INTRAVENOUS | Qty: 2000 | Status: AC

## 2021-10-31 MED FILL — Sodium Bicarbonate IV Soln 8.4%: INTRAVENOUS | Qty: 50 | Status: AC

## 2021-10-31 NOTE — Progress Notes (Signed)
      [address]       [place] [address]             [phone]        Eating dinner, feels well  BP (!) 146/78 (BP Location: Right Arm)   Pulse 63   Temp 98.8 F (37.1 C) (Oral)   Resp 13   Ht 5' 7.5" (1.715 m)   Wt 76.8 kg   SpO2 92%   BMI 26.13 kg/m   Intake/Output Summary (Last 24 hours) at 10/31/2021 1727 Last data filed at 10/31/2021 1500 Gross per 24 hour  Intake 403.35 ml  Output 2275 ml  Net -1871.65 ml   Continue diuresis Wean O2 as tolerated  Remo Lipps C. Roxan Hockey, MD [facility] [phone]

## 2021-10-31 NOTE — Discharge Instructions (Addendum)
Discharge Instructions:  1. You may shower, please wash incisions daily with soap and water and keep dry.  If you wish to cover wounds with dressing you may do so but please keep clean and change daily.  No tub baths or swimming until incisions have completely healed.  If your incisions become red or develop any drainage please call our office at [phone]  2. No Driving until cleared by Dr. Leonarda Salon office and you are no longer using narcotic pain medications  3. Monitor your weight daily.. Please use the same scale and weigh at same time... If you gain 5-10 lbs in 48 hours with associated lower extremity swelling, please contact our office at [phone]  4. Fever of 101.5 for at least 24 hours with no source, please contact our office at [phone]  5. Activity- up as tolerated, please walk at least 3 times per day.  Avoid strenuous activity, no lifting, pushing, or pulling with your arms over 8-10 lbs for a minimum of 6 weeks  6. If any questions or concerns arise, please do not hesitate to contact our office at [phone]   Information on my medicine - ELIQUIS (apixaban)  This medication education was reviewed with me or my healthcare representative as part of my discharge preparation.  The pharmacist that spoke with me during my hospital stay was:    Why was Eliquis prescribed for you? Eliquis was prescribed for you to reduce the risk of a blood clot forming that can cause a stroke if you have a medical condition called atrial fibrillation (a type of irregular heartbeat).  What do You need to know about Eliquis ? Take your Eliquis TWICE DAILY - one tablet in the morning and one tablet in the evening with or without food. If you have difficulty swallowing the tablet whole please discuss with your pharmacist how to take the medication safely.  Take Eliquis exactly as prescribed by your doctor and DO NOT stop taking Eliquis without talking to the doctor who prescribed  the medication.  Stopping may increase your risk of developing a stroke.  Refill your prescription before you run out.  After discharge, you should have regular check-up appointments with your healthcare provider that is prescribing your Eliquis.  In the future your dose may need to be changed if your kidney function or weight changes by a significant amount or as you get older.  What do you do if you miss a dose? If you miss a dose, take it as soon as you remember on the same day and resume taking twice daily.  Do not take more than one dose of ELIQUIS at the same time to make up a missed dose.  Important Safety Information A possible side effect of Eliquis is bleeding. You should call your healthcare provider right away if you experience any of the following: Bleeding from an injury or your nose that does not stop. Unusual colored urine (red or dark brown) or unusual colored stools (red or black). Unusual bruising for unknown reasons. A serious fall or if you hit your head (even if there is no bleeding).  Some medicines may interact with Eliquis and might increase your risk of bleeding or clotting while on Eliquis. To help avoid this, consult your healthcare provider or pharmacist prior to using any new prescription or non-prescription medications, including herbals, vitamins, non-steroidal anti-inflammatory drugs (NSAIDs) and supplements.  This website has more information on Eliquis (apixaban): http://www.eliquis.com/eliquis/home

## 2021-10-31 NOTE — Progress Notes (Signed)
4 Days Post-Op Procedure(s) (LRB): CORONARY ARTERY BYPASS GRAFTING (CABG) X THREE ON PUMP USING LEFT INTERNAL MAMMARY ARTERY AND RIGHT ENDOSCOPIC GREATER SAPHENOUS VEIN CONDUITS (N/A) TRANSESOPHAGEAL ECHOCARDIOGRAM (TEE) (N/A) APPLICATION OF CELL SAVER ENDOVEIN HARVEST OF GREATER SAPHENOUS VEIN (Right) Subjective: "Only hurts when I cough." Poor appetite but no nausea  Objective: Vital signs in last 24 hours: Temp:  [97.9 F (36.6 C)-98.9 F (37.2 C)] 98.9 F (37.2 C) (11/22 0635) Pulse Rate:  [48-88] 78 (11/22 0405) Cardiac Rhythm: Normal sinus rhythm (11/22 0400) Resp:  [13-22] 20 (11/22 0405) BP: (100-178)/(32-97) 148/80 (11/22 0405) SpO2:  [86 %-98 %] 92 % (11/22 0405) Weight:  [76.8 kg] 76.8 kg (11/22 0500)  Hemodynamic parameters for last 24 hours:    Intake/Output from previous day: 11/21 0701 - 11/22 0700 In: 896.5 [P.O.:480; I.V.:416.5] Out: 2025 [Urine:2025] Intake/Output this shift: No intake/output data recorded.  General appearance: alert, cooperative, and no distress Neurologic: intact Heart: regular rate and rhythm Lungs: diminished breath sounds bibasilar Abdomen: normal findings: soft, non-tender  Lab Results: Recent Labs    10/30/21 0320 10/31/21 0530  WBC 10.4 9.3  HGB 10.0* 10.0*  HCT 31.5* 31.5*  PLT 143* 167   BMET:  Recent Labs    10/30/21 0320 10/31/21 0530  NA 136 134*  K 4.3 3.6  CL 104 100  CO2 24 23  GLUCOSE 99 189*  BUN 30* 33*  CREATININE 1.89* 1.62*  CALCIUM 9.3 9.1    PT/INR: No results for input(s): LABPROT, INR in the last 72 hours. ABG    Component Value Date/Time   PHART 7.349 (L) 10/28/2021 1126   HCO3 22.3 10/28/2021 1126   TCO2 24 10/28/2021 1126   ACIDBASEDEF 3.0 (H) 10/28/2021 1126   O2SAT 52.9 10/30/2021 1510   CBG (last 3)  Recent Labs    10/30/21 1559 10/30/21 2219 10/31/21 0632  GLUCAP 111* 117* 103*    Assessment/Plan: S/P Procedure(s) (LRB): CORONARY ARTERY BYPASS GRAFTING (CABG) X THREE  ON PUMP USING LEFT INTERNAL MAMMARY ARTERY AND RIGHT ENDOSCOPIC GREATER SAPHENOUS VEIN CONDUITS (N/A) TRANSESOPHAGEAL ECHOCARDIOGRAM (TEE) (N/A) APPLICATION OF CELL SAVER ENDOVEIN HARVEST OF GREATER SAPHENOUS VEIN (Right) Overall looks better today NEURO- intact CV- in SR with PACs on Iv amiodarone  Transition to PO amiodarone, continue metoprolol  ASA, statin RESP_ CXR improved atelectasis, down to 8L [place]  Continue IS, flutter, diuresis, nebs RENAL- creatinine back near baseline and diuresed well  IV Lasix again today, supplement K ENDO- CBG well controlled GI- diet as tolerated SCD + enoxaparin Continue cardiac rehab Dc central line   LOS: 4 days    [name] 10/31/2021

## 2021-11-01 ENCOUNTER — Inpatient Hospital Stay (HOSPITAL_COMMUNITY): Payer: Medicare Other

## 2021-11-01 LAB — COMPREHENSIVE METABOLIC PANEL
ALT: 6 U/L (ref 0–44)
AST: 14 U/L — ABNORMAL LOW (ref 15–41)
Albumin: 2.5 g/dL — ABNORMAL LOW (ref 3.5–5.0)
Alkaline Phosphatase: 106 U/L (ref 38–126)
Anion gap: 8 (ref 5–15)
BUN: 34 mg/dL — ABNORMAL HIGH (ref 8–23)
CO2: 25 mmol/L (ref 22–32)
Calcium: 9.3 mg/dL (ref 8.9–10.3)
Chloride: 104 mmol/L (ref 98–111)
Creatinine, Ser: 1.54 mg/dL — ABNORMAL HIGH (ref 0.61–1.24)
GFR, Estimated: 46 mL/min — ABNORMAL LOW (ref >60)
Glucose, Bld: 96 mg/dL (ref 70–99)
Potassium: 3.9 mmol/L (ref 3.5–5.1)
Sodium: 137 mmol/L (ref 135–145)
Total Bilirubin: 0.5 mg/dL (ref 0.3–1.2)
Total Protein: 5.6 g/dL — ABNORMAL LOW (ref 6.5–8.1)

## 2021-11-01 LAB — GLUCOSE, CAPILLARY
Glucose-Capillary: 90 mg/dL (ref 70–99)
Glucose-Capillary: 96 mg/dL (ref 70–99)
Glucose-Capillary: 95 mg/dL (ref 70–99)
Glucose-Capillary: 102 mg/dL — ABNORMAL HIGH (ref 70–99)

## 2021-11-01 LAB — CBC
HCT: 33 % — ABNORMAL LOW (ref 39.0–52.0)
Hemoglobin: 10.3 g/dL — ABNORMAL LOW (ref 13.0–17.0)
MCH: 28.9 pg (ref 26.0–34.0)
MCHC: 31.2 g/dL (ref 30.0–36.0)
MCV: 92.7 fL (ref 80.0–100.0)
Platelets: 182 10*3/uL (ref 150–400)
RBC: 3.56 MIL/uL — ABNORMAL LOW (ref 4.22–5.81)
RDW: 12.6 % (ref 11.5–15.5)
WBC: 8.1 10*3/uL (ref 4.0–10.5)
nRBC: 0 % (ref 0.0–0.2)

## 2021-11-01 IMAGING — DX DG CHEST 1V PORT
1 series · 1 of 1 positions shown · non-contrast
Comparison: [DATE].

CLINICAL DATA: Status post coronary bypass graft.

EXAM:
PORTABLE CHEST 1 VIEW

[chest]
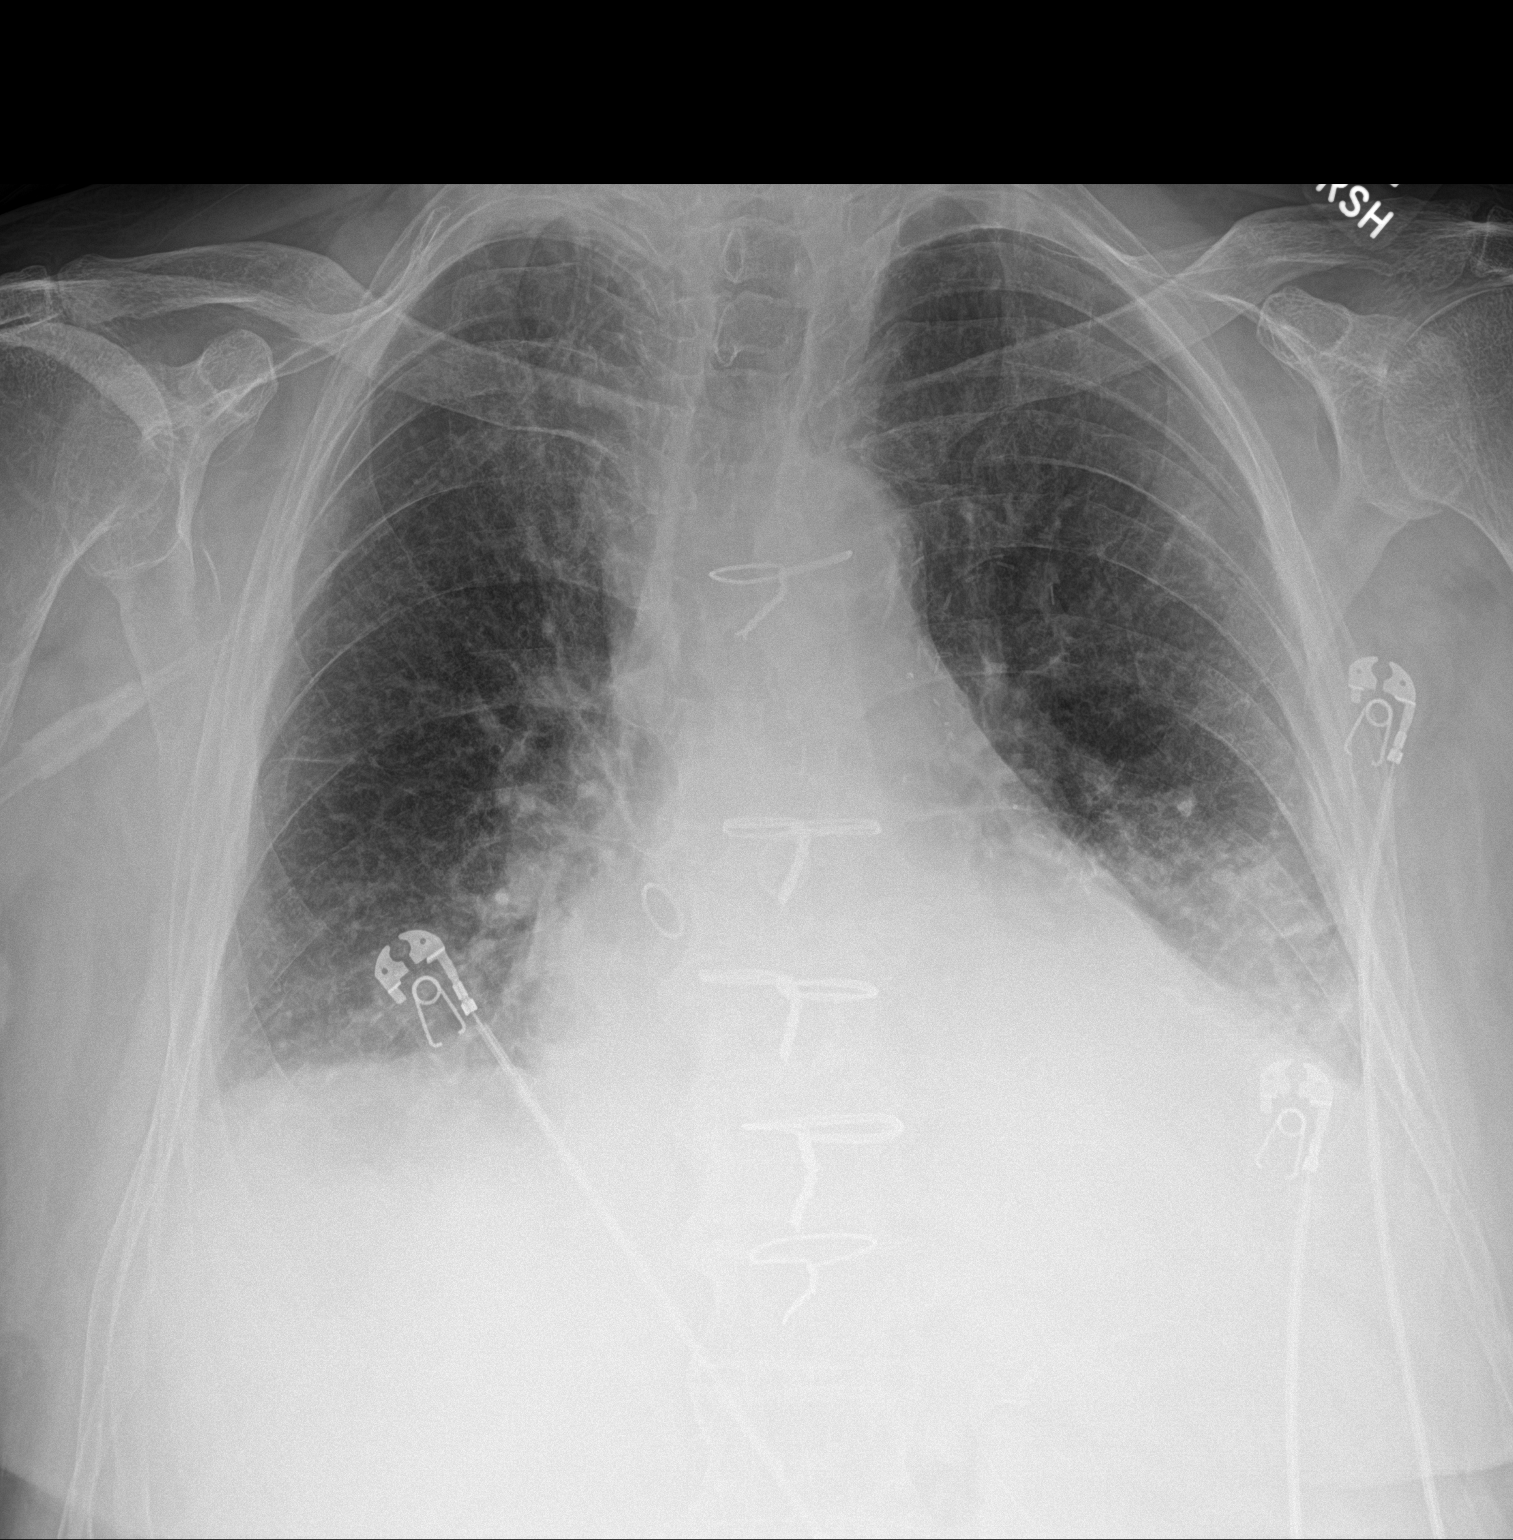

[1 of 1 positions shown; findings below may reference images not displayed]

FINDINGS: Stable cardiomediastinal silhouette. No pneumothorax is noted. Mild
bibasilar subsegmental atelectasis is noted with small pleural
effusions. Bony thorax is unremarkable.
IMPRESSION: Mild bibasilar subsegmental atelectasis with small bilateral pleural
effusions.

## 2021-11-01 MED ORDER — SODIUM CHLORIDE 0.9% FLUSH
3.0000 mL | Freq: Two times a day (BID) | INTRAVENOUS | Status: DC
  Administered 2021-11-01 – 2021-11-03 (×4): 3 mL via INTRAVENOUS

## 2021-11-01 MED ORDER — LEVALBUTEROL HCL 0.63 MG/3ML IN NEBU
0.6300 mg | INHALATION_SOLUTION | Freq: Three times a day (TID) | RESPIRATORY_TRACT | Status: AC
  Administered 2021-11-01 (×2): 0.63 mg via RESPIRATORY_TRACT
  Filled 2021-11-01 (×3): qty 3

## 2021-11-01 MED ORDER — FUROSEMIDE 40 MG PO TABS
40.0000 mg | ORAL_TABLET | Freq: Every day | ORAL | Status: DC
  Administered 2021-11-01 – 2021-11-02 (×2): 40 mg via ORAL
  Filled 2021-11-01 (×2): qty 1

## 2021-11-01 MED ORDER — ~~LOC~~ CARDIAC SURGERY, PATIENT & FAMILY EDUCATION: Freq: Once | Status: AC

## 2021-11-01 MED ORDER — POTASSIUM CHLORIDE CRYS ER 20 MEQ PO TBCR
20.0000 meq | EXTENDED_RELEASE_TABLET | Freq: Every day | ORAL | Status: DC
  Administered 2021-11-01 – 2021-11-02 (×2): 20 meq via ORAL
  Filled 2021-11-01 (×2): qty 1

## 2021-11-01 MED ORDER — MAGNESIUM HYDROXIDE 400 MG/5ML PO SUSP: 30.0000 mL | Freq: Every day | ORAL | Status: DC | PRN

## 2021-11-01 MED ORDER — SODIUM CHLORIDE 0.9 % IV SOLN: 250.0000 mL | INTRAVENOUS | Status: DC | PRN

## 2021-11-01 MED ORDER — ALUM & MAG HYDROXIDE-SIMETH 200-200-20 MG/5ML PO SUSP: 15.0000 mL | Freq: Four times a day (QID) | ORAL | Status: DC | PRN

## 2021-11-01 MED ORDER — SODIUM CHLORIDE 0.9% FLUSH: 3.0000 mL | INTRAVENOUS | Status: DC | PRN

## 2021-11-01 NOTE — Progress Notes (Signed)
Notified by CCMD that patient had 2.24 pause. Patient resting and asymptomatic. Will continue to monitor

## 2021-11-01 NOTE — Progress Notes (Signed)
Mobility Specialist: Progress Note   11/01/21 1705  Mobility  Activity Ambulated in room  Level of Assistance Minimal assist, patient does 75% or more  Assistive Device Front wheel walker  Distance Ambulated (ft) 180 ft  Mobility Ambulated with assistance in room  Mobility Response Tolerated well  Mobility performed by Mobility specialist  Bed Position Chair  $Mobility charge 1 Mobility   Pre-Mobility 2 L/min [name]: 69 HR, 97% SpO2 Post-Mobility: 76 HR, 93% SpO2  Pt to BR per request and then agreeable to ambulation, BM unsuccessful. Pt ambulated on RA without difficulty. Pt back to recliner after walk with call bell and phone in his lap.   [facility] Joshua Wyatt Mobility Specialist Mobility Specialist Phone #1: [phone] Mobility Specialist Phone #2: [phone]

## 2021-11-01 NOTE — Progress Notes (Signed)
Pacing wires removed per order. Patient tolerated well. Pacing wires intact. Site WNL. Post removal instructions reviewed with patient. VSS.

## 2021-11-01 NOTE — Progress Notes (Signed)
Pt admitted to room 4E13 from [facility]. CHG wipe given. Initiated tele. Oriented pt to the unit. VSS. Call bell within reach.   Joshua Wyatt

## 2021-11-01 NOTE — Progress Notes (Signed)
5 Days Post-Op Procedure(s) (LRB): CORONARY ARTERY BYPASS GRAFTING (CABG) X THREE ON PUMP USING LEFT INTERNAL MAMMARY ARTERY AND RIGHT ENDOSCOPIC GREATER SAPHENOUS VEIN CONDUITS (N/A) TRANSESOPHAGEAL ECHOCARDIOGRAM (TEE) (N/A) APPLICATION OF CELL SAVER ENDOVEIN HARVEST OF GREATER SAPHENOUS VEIN (Right) Subjective: No complaints this AM, appetite better  Objective: Vital signs in last 24 hours: Temp:  [97.4 F (36.3 C)-98.8 F (37.1 C)] 98.4 F (36.9 C) (11/23 0400) Pulse Rate:  [60-96] 77 (11/23 0700) Cardiac Rhythm: Normal sinus rhythm (11/23 0400) Resp:  [13-24] 20 (11/23 0700) BP: (112-152)/(58-106) 152/69 (11/23 0700) SpO2:  [91 %-100 %] 98 % (11/23 0700) Weight:  [75.7 kg] 75.7 kg (11/23 0500)  Hemodynamic parameters for last 24 hours:    Intake/Output from previous day: 11/22 0701 - 11/23 0700 In: 254.1 [P.O.:150; I.V.:104.1] Out: 2294 [Urine:2294] Intake/Output this shift: No intake/output data recorded.  General appearance: alert, cooperative, and no distress Neurologic: intact Heart: regular rate and rhythm Lungs: diminished breath sounds bibasilar and wheezes bibasilar Abdomen: normal findings: soft, non-tender Wound: clean and dry  Lab Results: Recent Labs    10/31/21 0530 11/01/21 0057  WBC 9.3 8.1  HGB 10.0* 10.3*  HCT 31.5* 33.0*  PLT 167 182   BMET:  Recent Labs    10/31/21 0530 11/01/21 0421  NA 134* 137  K 3.6 3.9  CL 100 104  CO2 23 25  GLUCOSE 189* 96  BUN 33* 34*  CREATININE 1.62* 1.54*  CALCIUM 9.1 9.3    PT/INR: No results for input(s): LABPROT, INR in the last 72 hours. ABG    Component Value Date/Time   PHART 7.349 (L) 10/28/2021 1126   HCO3 22.3 10/28/2021 1126   TCO2 24 10/28/2021 1126   ACIDBASEDEF 3.0 (H) 10/28/2021 1126   O2SAT 52.9 10/30/2021 1510   CBG (last 3)  Recent Labs    10/31/21 1531 10/31/21 2103 11/01/21 0652  GLUCAP 109* 101* 90    Assessment/Plan: S/P Procedure(s) (LRB): CORONARY ARTERY BYPASS  GRAFTING (CABG) X THREE ON PUMP USING LEFT INTERNAL MAMMARY ARTERY AND RIGHT ENDOSCOPIC GREATER SAPHENOUS VEIN CONDUITS (N/A) TRANSESOPHAGEAL ECHOCARDIOGRAM (TEE) (N/A) APPLICATION OF CELL SAVER ENDOVEIN HARVEST OF GREATER SAPHENOUS VEIN (Right) Plan for transfer to step-down: see transfer orders POD # 5 CABG, slowly improving with good progress over past 48 hours NEURO- intact CV- in SR with PACs on Lopressor and amiodarone  ASA, Zetia, Repatha  Dc pacing wires RESP- still some atelectasis  Continue pulmonary Rx, diuresis, wean O2 RENAL- creatinine back to baseline  PO Lasix ENDO- CBG well controlled GI- tolerating Po SCD + enoxaparin for DVT prophylaxis Continue cardiac rehab   LOS: 5 days    [name] 11/01/2021

## 2021-11-02 ENCOUNTER — Inpatient Hospital Stay (HOSPITAL_COMMUNITY): Payer: Medicare Other

## 2021-11-02 DIAGNOSIS — I2583 Coronary atherosclerosis due to lipid rich plaque: Secondary | ICD-10-CM

## 2021-11-02 DIAGNOSIS — I2510 Atherosclerotic heart disease of native coronary artery without angina pectoris: Secondary | ICD-10-CM

## 2021-11-02 LAB — BASIC METABOLIC PANEL
Anion gap: 9 (ref 5–15)
BUN: 31 mg/dL — ABNORMAL HIGH (ref 8–23)
CO2: 27 mmol/L (ref 22–32)
Calcium: 9.8 mg/dL (ref 8.9–10.3)
Chloride: 103 mmol/L (ref 98–111)
Creatinine, Ser: 1.47 mg/dL — ABNORMAL HIGH (ref 0.61–1.24)
GFR, Estimated: 49 mL/min — ABNORMAL LOW (ref >60)
Glucose, Bld: 100 mg/dL — ABNORMAL HIGH (ref 70–99)
Potassium: 4.2 mmol/L (ref 3.5–5.1)
Sodium: 139 mmol/L (ref 135–145)

## 2021-11-02 LAB — CBC
HCT: 34.1 % — ABNORMAL LOW (ref 39.0–52.0)
Hemoglobin: 10.7 g/dL — ABNORMAL LOW (ref 13.0–17.0)
MCH: 28.9 pg (ref 26.0–34.0)
MCHC: 31.4 g/dL (ref 30.0–36.0)
MCV: 92.2 fL (ref 80.0–100.0)
Platelets: 214 10*3/uL (ref 150–400)
RBC: 3.7 MIL/uL — ABNORMAL LOW (ref 4.22–5.81)
RDW: 12.6 % (ref 11.5–15.5)
WBC: 7.2 10*3/uL (ref 4.0–10.5)
nRBC: 0 % (ref 0.0–0.2)

## 2021-11-02 LAB — LIPID PANEL
Cholesterol: 86 mg/dL (ref 0–200)
HDL: 25 mg/dL — ABNORMAL LOW (ref >40)
LDL Cholesterol: 30 mg/dL (ref 0–99)
Total CHOL/HDL Ratio: 3.4 RATIO
Triglycerides: 157 mg/dL — ABNORMAL HIGH (ref <150)
VLDL: 31 mg/dL (ref 0–40)

## 2021-11-02 LAB — GLUCOSE, CAPILLARY
Glucose-Capillary: 94 mg/dL (ref 70–99)
Glucose-Capillary: 95 mg/dL (ref 70–99)

## 2021-11-02 LAB — MAGNESIUM: Magnesium: 1.9 mg/dL (ref 1.7–2.4)

## 2021-11-02 IMAGING — CR DG CHEST 2V
2 series · 2 of 2 positions shown · non-contrast
Comparison: Chest x-ray from yesterday.

CLINICAL DATA: Atelectasis.

EXAM:
CHEST - 2 VIEW

[chest ap]
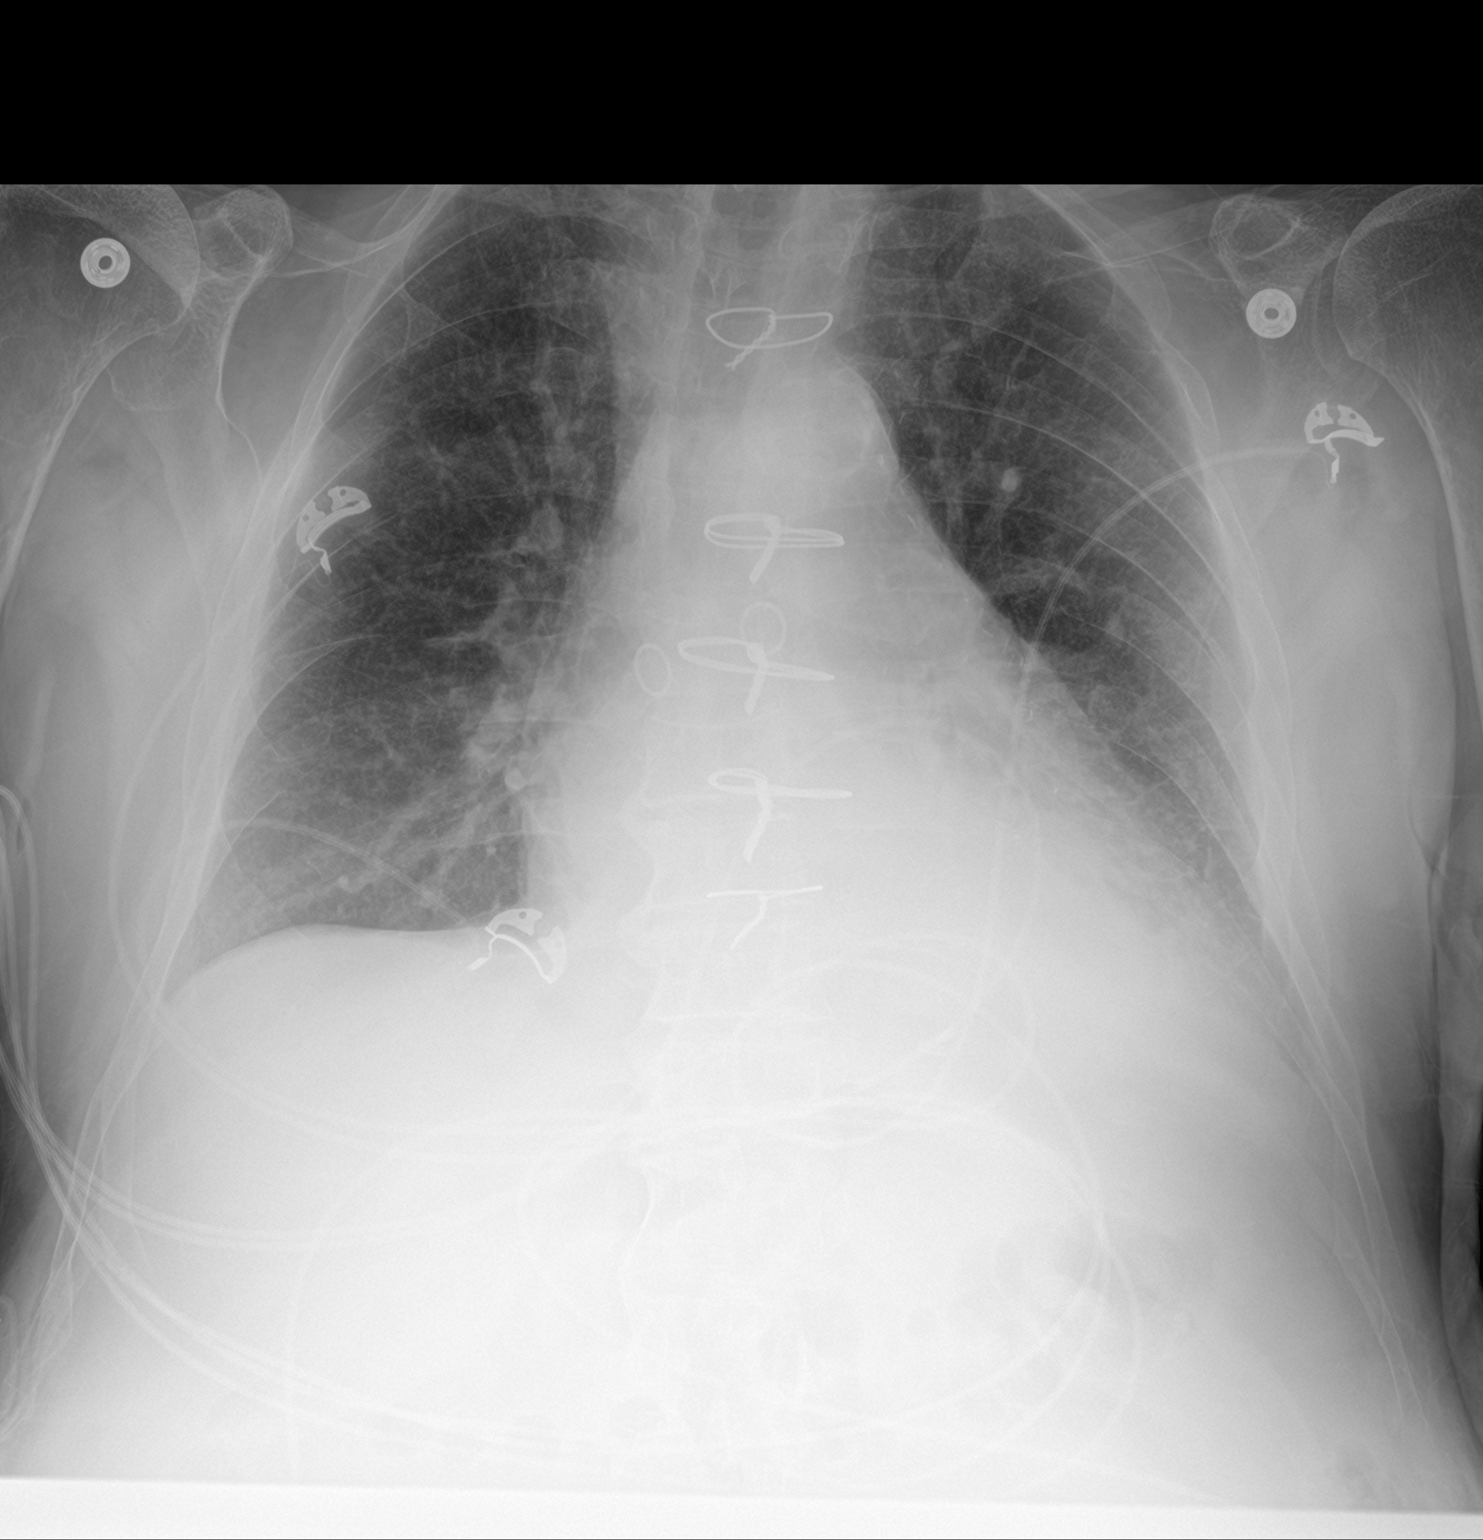

[chest lat]
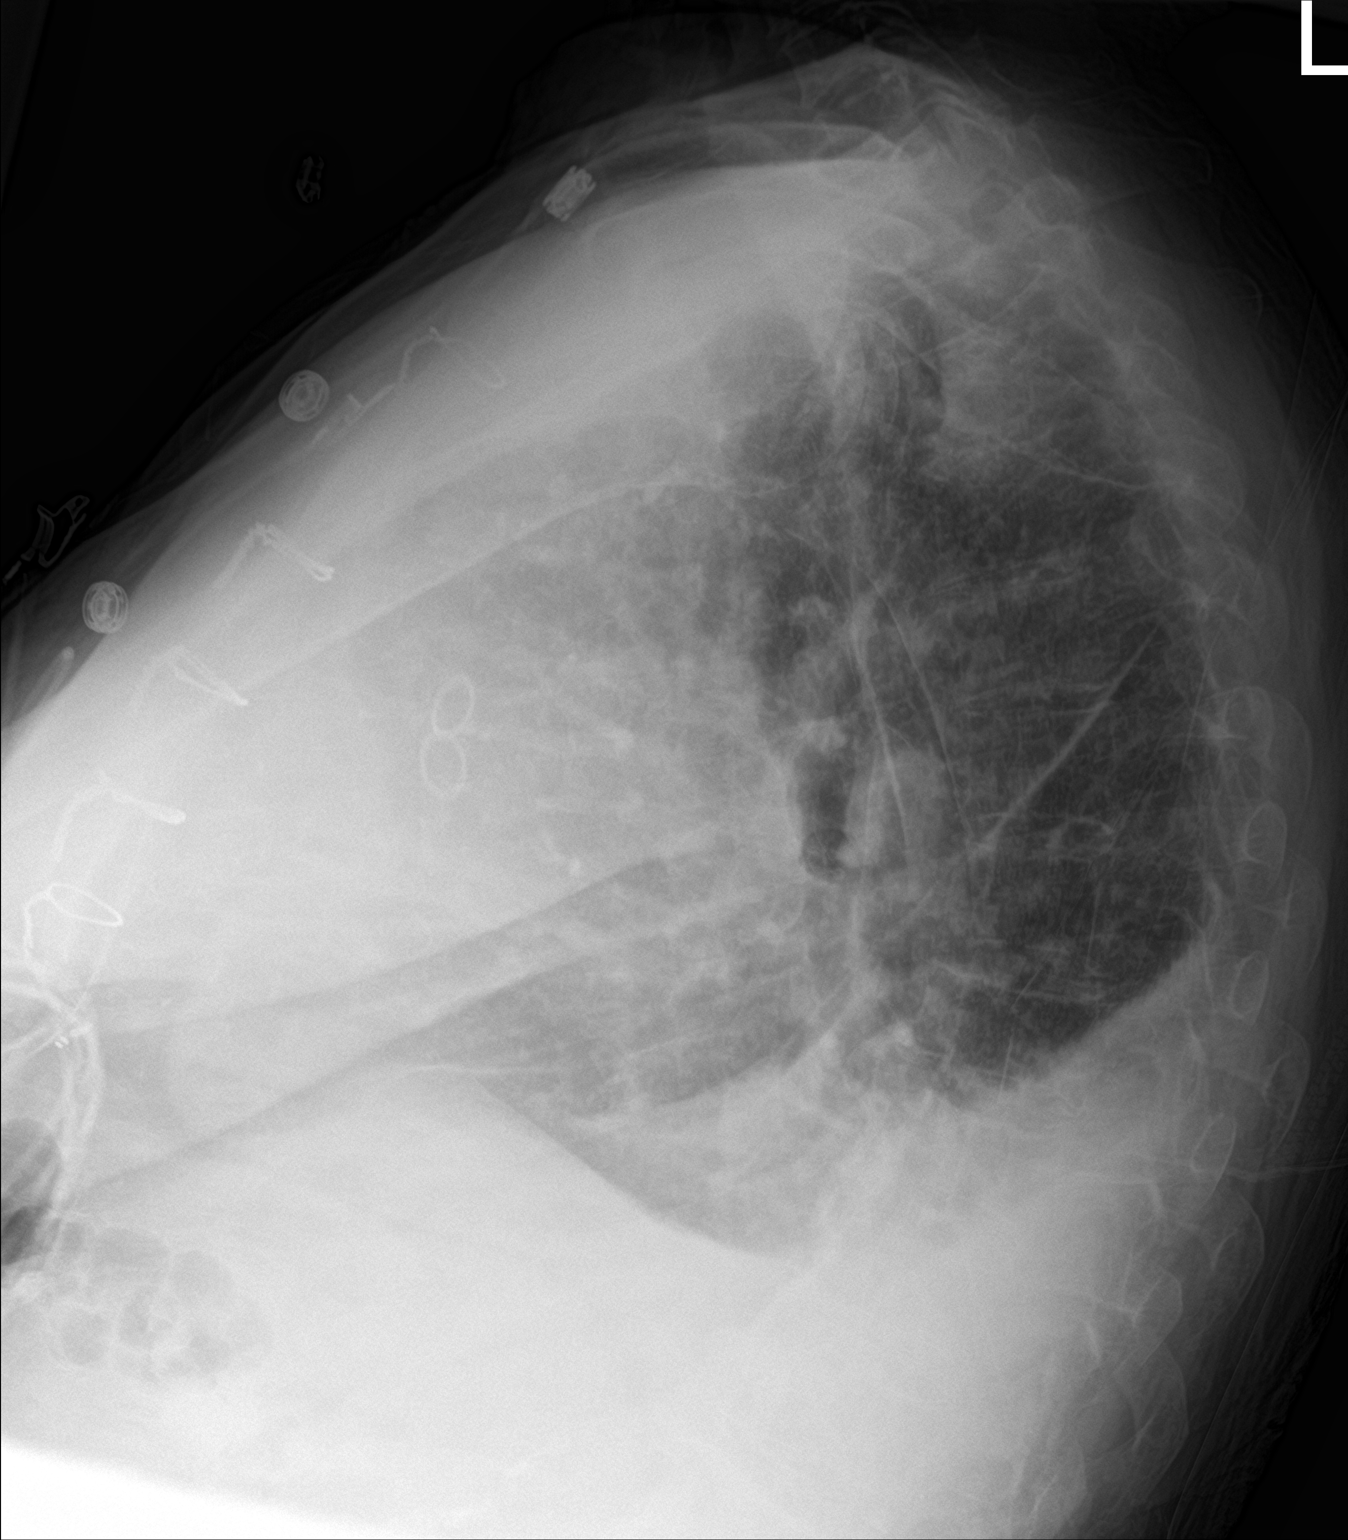

[2 of 2 positions shown; findings below may reference images not displayed]

FINDINGS: Stable cardiomediastinal silhouette status post CABG. Unchanged
small bilateral pleural effusions. Mildly improved aeration of the
lung bases. No pneumothorax. No acute osseous abnormality.
IMPRESSION: 1. Unchanged small bilateral pleural effusions. Mildly improved
bibasilar atelectasis.

## 2021-11-02 MED ORDER — METOPROLOL TARTRATE 25 MG PO TABS
37.5000 mg | ORAL_TABLET | Freq: Two times a day (BID) | ORAL | Status: DC
  Administered 2021-11-02: 37.5 mg via ORAL
  Filled 2021-11-02: qty 1

## 2021-11-02 NOTE — Progress Notes (Addendum)
      [address]       ,[place] [address]             [phone]        6 Days Post-Op Procedure(s) (LRB): CORONARY ARTERY BYPASS GRAFTING (CABG) X THREE ON PUMP USING LEFT INTERNAL MAMMARY ARTERY AND RIGHT ENDOSCOPIC GREATER SAPHENOUS VEIN CONDUITS (N/A) TRANSESOPHAGEAL ECHOCARDIOGRAM (TEE) (N/A) APPLICATION OF CELL SAVER ENDOVEIN HARVEST OF GREATER SAPHENOUS VEIN (Right)  Subjective: Patient sitting in chair. He has complaints of loose stool  Objective: Vital signs in last 24 hours: Temp:  [97.4 F (36.3 C)-98.6 F (37 C)] 98.2 F (36.8 C) (11/24 0351) Pulse Rate:  [58-78] 68 (11/24 0351) Cardiac Rhythm: Normal sinus rhythm;Bundle branch block (11/24 0737) Resp:  [11-20] 15 (11/24 0351) BP: (131-154)/(65-84) 147/70 (11/24 0351) SpO2:  [91 %-99 %] 93 % (11/24 0351) FiO2 (%):  [24 %] 24 % (11/23 1944) Weight:  [76.3 kg] 76.3 kg (11/24 0351)  Pre op weight  78. kg Current Weight  11/02/21 76.3 kg      Intake/Output from previous day: 11/23 0701 - 11/24 0700 In: 0  Out: 1100 [Urine:1100]   Physical Exam:  Cardiovascular: IRRR Pulmonary: Diminished bibasilar breath sounds Abdomen: Soft, non tender, bowel sounds present. Extremities: No lower extremity edema. Wounds: Clean and dry.  No erythema or signs of infection.  Lab Results: CBC: Recent Labs    11/01/21 0057 11/02/21 0117  WBC 8.1 7.2  HGB 10.3* 10.7*  HCT 33.0* 34.1*  PLT 182 214   BMET:  Recent Labs    11/01/21 0421 11/02/21 0117  NA 137 139  K 3.9 4.2  CL 104 103  CO2 25 27  GLUCOSE 96 100*  BUN 34* 31*  CREATININE 1.54* 1.47*  CALCIUM 9.3 9.8    PT/INR:  Lab Results  Component Value Date   INR 1.4 (H) 10/27/2021   INR 1.1 10/27/2021   INR 1.0 04/24/2019   ABG:  INR: Will add last result for INR, ABG once components are confirmed Will add last 4 CBG results once components are confirmed  Assessment/Plan:  1. CV - Previous a fib.[name] Appears to be trying  to go back into a fib this am. Had a 2.24 pause last night at 11:23 pm and bradycardia to 38 at about 3 am.On Amiodarone 400 mg bid, Lopressor 25 mg bid. Will increase Lopressor for better BP and HR control as HR in the 110's this am. Hopefully, can avoid anticoagulation 2.  Pulmonary - On 1 liter of oxygen via [place]. 3. Volume Overload - On Lasix 40 mg daily 4.  Expected post op acute blood loss anemia - H and H this am stable at 10.7 and 34.1 5. CBGs 95/102/94. Pre op HGA1C 5.7. He likely has pre diabetes. Will stop accu checks and SS PRN. 6. Creatinine this am decreased from 1.54 to 1.47. Creatinine upon admission 1.32. 7. Patient with loose stools so will stop stool softeners  Donielle M ZimmermanPA-C 11/02/2021,8:19 AM    Chart reviewed, patient examined, agree with above. He feels well and wants to go home. Lopressor increased this am and if rhythm stays sinus overnight he can go home tomorrow.

## 2021-11-02 NOTE — Plan of Care (Signed)
  Problem: Health Behavior/Discharge Planning: Goal: Ability to manage health-related needs will improve Outcome: Progressing   Problem: Clinical Measurements: Goal: Will remain free from infection Outcome: Progressing Goal: Diagnostic test results will improve Outcome: Progressing   

## 2021-11-02 NOTE — Progress Notes (Signed)
Notified by CCMD that patient's HR 38. Did not sustain. Returned to 69-70.  Patient asymptomatic.Will continue to monitor

## 2021-11-03 ENCOUNTER — Other Ambulatory Visit (HOSPITAL_COMMUNITY): Payer: Self-pay

## 2021-11-03 LAB — BASIC METABOLIC PANEL
Anion gap: 10 (ref 5–15)
BUN: 25 mg/dL — ABNORMAL HIGH (ref 8–23)
CO2: 26 mmol/L (ref 22–32)
Calcium: 9.5 mg/dL (ref 8.9–10.3)
Chloride: 102 mmol/L (ref 98–111)
Creatinine, Ser: 1.44 mg/dL — ABNORMAL HIGH (ref 0.61–1.24)
GFR, Estimated: 50 mL/min — ABNORMAL LOW (ref >60)
Glucose, Bld: 105 mg/dL — ABNORMAL HIGH (ref 70–99)
Potassium: 3.5 mmol/L (ref 3.5–5.1)
Sodium: 138 mmol/L (ref 135–145)

## 2021-11-03 MED ORDER — LOSARTAN POTASSIUM 25 MG PO TABS
25.0000 mg | ORAL_TABLET | Freq: Every day | ORAL | 11 refills | Status: DC
  Filled 2021-11-03: qty 30, 30d supply, fill #0

## 2021-11-03 MED ORDER — METOPROLOL TARTRATE 50 MG PO TABS
50.0000 mg | ORAL_TABLET | Freq: Two times a day (BID) | ORAL | 5 refills | Status: DC
  Filled 2021-11-03: qty 60, 30d supply, fill #0

## 2021-11-03 MED ORDER — AMIODARONE HCL 200 MG PO TABS
400.0000 mg | ORAL_TABLET | Freq: Two times a day (BID) | ORAL | 1 refills | Status: DC
  Filled 2021-11-03: qty 60, 27d supply, fill #0

## 2021-11-03 MED ORDER — APIXABAN 5 MG PO TABS
5.0000 mg | ORAL_TABLET | Freq: Two times a day (BID) | ORAL | 2 refills | Status: DC
  Filled 2021-11-03: qty 60, 30d supply, fill #0

## 2021-11-03 MED ORDER — OXYCODONE HCL 5 MG PO TABS
5.0000 mg | ORAL_TABLET | ORAL | 0 refills | Status: AC | PRN
  Filled 2021-11-03: qty 20, 4d supply, fill #0

## 2021-11-03 MED ORDER — APIXABAN 5 MG PO TABS
5.0000 mg | ORAL_TABLET | Freq: Two times a day (BID) | ORAL | Status: DC
  Administered 2021-11-03: 5 mg via ORAL
  Filled 2021-11-03: qty 1

## 2021-11-03 MED ORDER — APIXABAN 5 MG PO TABS: 5.0000 mg | ORAL_TABLET | Freq: Two times a day (BID) | ORAL | 2 refills | Status: AC

## 2021-11-03 MED ORDER — METOPROLOL TARTRATE 50 MG PO TABS
50.0000 mg | ORAL_TABLET | Freq: Two times a day (BID) | ORAL | Status: DC
  Administered 2021-11-03: 50 mg via ORAL
  Filled 2021-11-03: qty 1

## 2021-11-03 MED ORDER — LOSARTAN POTASSIUM 25 MG PO TABS: 25.0000 mg | ORAL_TABLET | Freq: Every day | ORAL | Status: DC

## 2021-11-03 MED ORDER — POTASSIUM CHLORIDE CRYS ER 20 MEQ PO TBCR
40.0000 meq | EXTENDED_RELEASE_TABLET | Freq: Once | ORAL | Status: AC
  Administered 2021-11-03: 40 meq via ORAL
  Filled 2021-11-03: qty 2

## 2021-11-03 NOTE — Progress Notes (Signed)
ANTICOAGULATION CONSULT NOTE - Initial Consult  Pharmacy Consult for Eliquis Indication: atrial fibrillation  Allergies  Allergen Reactions   Ezetimibe     leg pains   Statins Swelling and Other (See Comments)    Muscle pain, also    Patient Measurements: Height: 5' 7.5" (171.5 cm) Weight: 76.3 kg (168 lb 3.4 oz) IBW/kg (Calculated) : 67.25   Vital Signs: Temp: 98.6 F (37 C) (11/25 0334) Temp Source: Oral (11/25 0334) BP: 150/71 (11/25 0334) Pulse Rate: 66 (11/25 0334)  Labs: Recent Labs    11/01/21 0057 11/01/21 0421 11/02/21 0117 11/03/21 0150  HGB 10.3*  --  10.7*  --   HCT 33.0*  --  34.1*  --   PLT 182  --  214  --   CREATININE  --  1.54* 1.47* 1.44*    Estimated Creatinine Clearance: 40.9 mL/min (A) (by C-G formula based on SCr of 1.44 mg/dL (H)).   Medical History: Past Medical History:  Diagnosis Date   Arthritis    "LITTLE IN MY LEFT HAND" (05/12/2018)   Bilateral calf pain 03/02/2013   lower ext.doppler-mild arterial insufficiency to lower ext. this is a disease in val   CAD (coronary artery disease)    a. cath 02/24/18 - 90% ost RCA; 40% ost & pro LAD   Cerebral atherosclerosis 03/02/2013   carotid duplex- normal study   Deviated septum    RIGHT   GERD (gastroesophageal reflux disease)    Heart murmur    History of echocardiogram 08/02/2005   normal study    History of kidney stones    Hyperlipemia    Hypertension 03/30/2013   PV Angiogram- rt. renal artery stent was widely open,50-60% lt. renal artery stenosis,lt. SFA stents patent with high grade above the knee  poplital stenosis   Hyponatremia 02/03/2017   Normal cardiac stress test 03/25/2013   EF 60%, normal stress test ,this is a presurgical  test   Peripheral vascular disease ([place])    PVD (peripheral vascular disease) ([place])    A. 2009: S/P PTA/stent to D SFA. B. 01/2017: angiosculpt atherectomy/drug-eluting balloon angioplasty to L SFA.   Renal artery stenosis ([place]) 03/02/2013    renal artery doppler-this was an abnormal doppler    Medications:  Scheduled:   amiodarone  400 mg Oral BID   apixaban  5 mg Oral BID   Chlorhexidine Gluconate Cloth  6 each Topical Daily   ezetimibe  10 mg Oral Daily   metoprolol tartrate  50 mg Oral BID   pantoprazole  40 mg Oral Daily   potassium chloride  40 mEq Oral Once   sodium chloride flush  3 mL Intravenous Q12H   tamsulosin  0.4 mg Oral QHS    Assessment: 75 yo male s/p CABG x 3, now with atrial fib.  Pharmacy asked to start Eliquis prior to discharge.  Goal of Therapy:  Monitor platelets by anticoagulation protocol: Yes   Plan:  Eliquis 5 mg po BID. Will educate patient prior to discharge.  [place] Crane, Roylene Reason, BCCP Clinical Pharmacist  11/03/2021 8:05 AM   [facility] pharmacy phone numbers are listed on [email or web address]

## 2021-11-03 NOTE — Progress Notes (Signed)
CARDIAC REHAB PHASE I   PRE:  Rate/Rhythm: 73 SR  BP:  Supine:   Sitting: 114/76  Standing:    SaO2: 96% RA  MODE:  Ambulation: 400 ft   POST:  Rate/Rhythm: 86 SR  BP:  Supine:   Sitting: 136/80  Standing:    SaO2: 96@ RA Tolerated ambulation well independently with a rolling walker.  Very excited to go home today.  Completed education re: sternal precautions, activity restrictions, exercise progression, signs of infection, when to call the surgeon.  He will be staying with a friend and taken care of.  Referred to phase II cardiac rehab in [place]. [id number] Liliane Channel RN, BSN 11/03/2021 10:06 AM

## 2021-11-03 NOTE — Progress Notes (Signed)
Discharge instructions (including medications) discussed with and copy provided to patient/caregiver 

## 2021-11-03 NOTE — Plan of Care (Signed)
  Problem: Education: Goal: Knowledge of General Education information will improve Description: Including pain rating scale, medication(s)/side effects and non-pharmacologic comfort measures Outcome: Adequate for Discharge   

## 2021-11-03 NOTE — Progress Notes (Addendum)
      [address]       [place] [address]             [phone]        7 Days Post-Op Procedure(s) (LRB): CORONARY ARTERY BYPASS GRAFTING (CABG) X THREE ON PUMP USING LEFT INTERNAL MAMMARY ARTERY AND RIGHT ENDOSCOPIC GREATER SAPHENOUS VEIN CONDUITS (N/A) TRANSESOPHAGEAL ECHOCARDIOGRAM (TEE) (N/A) APPLICATION OF CELL SAVER ENDOVEIN HARVEST OF GREATER SAPHENOUS VEIN (Right)  Subjective: Patient sitting in chair. Says he feels good, no new concerns.  Objective: Vital signs in last 24 hours: Temp:  [97.5 F (36.4 C)-98.6 F (37 C)] 98.6 F (37 C) (11/25 0334) Pulse Rate:  [66-100] 66 (11/25 0334) Cardiac Rhythm: Atrial fibrillation (11/24 2130) Resp:  [14-20] 16 (11/25 0334) BP: (132-156)/(71-105) 150/71 (11/25 0334) SpO2:  [94 %-97 %] 96 % (11/25 0334) Weight:  [76.3 kg] 76.3 kg (11/25 0334)  Pre op weight  78. kg Current Weight  11/03/21 76.3 kg      Intake/Output from previous day: 11/24 0701 - 11/25 0700 In: 290 [P.O.:290] Out: 1075 [Urine:1075]   Physical Exam:  Cardiovascular: SR with  brief periods of PAF last evening Pulmonary:clear, diminished bilateral breath sounds Abdomen: Soft, non tender, bowel sounds present. Extremities: No lower extremity edema. Wounds: Clean and dry.  No erythema or signs of infection.  Lab Results: CBC: Recent Labs    11/01/21 0057 11/02/21 0117  WBC 8.1 7.2  HGB 10.3* 10.7*  HCT 33.0* 34.1*  PLT 182 214    BMET:  Recent Labs    11/02/21 0117 11/03/21 0150  NA 139 138  K 4.2 3.5  CL 103 102  CO2 27 26  GLUCOSE 100* 105*  BUN 31* 25*  CREATININE 1.47* 1.44*  CALCIUM 9.8 9.5     PT/INR:  Lab Results  Component Value Date   INR 1.4 (H) 10/27/2021   INR 1.1 10/27/2021   INR 1.0 04/24/2019   ABG:  INR: Will add last result for INR, ABG once components are confirmed Will add last 4 CBG results once components are confirmed  Assessment/Plan:  1. CV - Continues to have brief  episodes of POF. On Amiodarone 400 mg bid and Lopressor 37.5 mg bid. SBP in the 150's this morning. Will increase Lopressor to $RemoveBefo'50mg'YtDUymtUbPe$  PO BID and will need to anticoagulate. 2.  Pulmonary - On RA with adequate O2 sats 3. Volume Overload - resolved, 2Kg below pre-op Wt.  Stop Lasix. 4.  Expected post op acute blood loss anemia - H and H trending up 5. Creatinine continues to trend toward baseline  Disposition: Anticipate discharge to home later today.   Joline Maxcy [phone] 11/03/2021,7:27 AM    Agree with above Rate controlled.  Small bursts of afib.  Will start eliquis Home soon  [employer]

## 2021-11-03 NOTE — Progress Notes (Addendum)
Progress Note  Patient Name: Joshua Wyatt Date of Encounter: 11/03/2021  [facility] Cardiologist: Dr. Allyson Sabal  Subjective   S/p CABG, plan for dc today. Overall patient is doing well.   Inpatient Medications    Scheduled Meds:  amiodarone  400 mg Oral BID   apixaban  5 mg Oral BID   Chlorhexidine Gluconate Cloth  6 each Topical Daily   ezetimibe  10 mg Oral Daily   metoprolol tartrate  50 mg Oral BID   pantoprazole  40 mg Oral Daily   sodium chloride flush  3 mL Intravenous Q12H   tamsulosin  0.4 mg Oral QHS   Continuous Infusions:  sodium chloride     promethazine (PHENERGAN) injection (IM or IVPB)     PRN Meds: sodium chloride, alum & mag hydroxide-simeth, magnesium hydroxide, metoprolol tartrate, ondansetron (ZOFRAN) IV, oxyCODONE, polyethylene glycol, promethazine (PHENERGAN) injection (IM or IVPB), sodium chloride flush   Vital Signs    Vitals:   11/02/21 1957 11/02/21 2338 11/03/21 0334 11/03/21 0832  BP: (!) 155/74 132/78 (!) 150/71   Pulse: 78 90 66   Resp: 17 20 16 20   Temp: (!) 97.5 F (36.4 C) 98 F (36.7 C) 98.6 F (37 C)   TempSrc: Oral Oral Oral Oral  SpO2: 96% 94% 96%   Weight:   76.3 kg   Height:        Intake/Output Summary (Last 24 hours) at 11/03/2021 1023 Last data filed at 11/03/2021 0718 Gross per 24 hour  Intake 290 ml  Output 1350 ml  Net -1060 ml   Last 3 Weights 11/03/2021 11/02/2021 11/01/2021  Weight (lbs) 168 lb 3.4 oz 168 lb 3.4 oz 166 lb 14.2 oz  Weight (kg) 76.3 kg 76.3 kg 75.7 kg      Telemetry    SR/PAF, PACs, pause 1.48 longest - Personally Reviewed  ECG    NO new - Personally Reviewed  Physical Exam   GEN: No acute distress.   Neck: No JVD Cardiac: RRR, no murmurs, rubs, or gallops.  Respiratory: Clear to auscultation bilaterally. GI: Soft, nontender, non-distended  MS: No edema; No deformity. Neuro:  Nonfocal  Psych: Normal affect   Labs    High Sensitivity Troponin:  No results for input(s):  TROPONINIHS in the last 720 hours.   Chemistry Recent Labs  Lab 10/28/21 0242 10/28/21 0508 10/28/21 1700 10/29/21 0420 11/01/21 0421 11/02/21 0117 11/03/21 0150  NA 140  137   < > 138   < > 137 139 138  K 4.1  4.1   < > 4.0   < > 3.9 4.2 3.5  CL 108  --  107   < > 104 103 102  CO2 22  --  23   < > 25 27 26   GLUCOSE 124*  --  140*   < > 96 100* 105*  BUN 12  --  15   < > 34* 31* 25*  CREATININE 1.25*  --  1.57*   < > 1.54* 1.47* 1.44*  CALCIUM 8.5*  --  8.8*   < > 9.3 9.8 9.5  MG 3.1*  --  2.8*  --   --  1.9  --   PROT  --   --   --   --  5.6*  --   --   ALBUMIN  --   --   --   --  2.5*  --   --   AST  --   --   --   --  14*  --   --   ALT  --   --   --   --  6  --   --   ALKPHOS  --   --   --   --  106  --   --   BILITOT  --   --   --   --  0.5  --   --   GFRNONAA 60*  --  45*   < > 46* 49* 50*  ANIONGAP 7  --  8   < > $R'8 9 10   'GW$ < > = values in this interval not displayed.    Lipids  Recent Labs  Lab 11/02/21 0117  CHOL 86  TRIG 157*  HDL 25*  LDLCALC 30  CHOLHDL 3.4    Hematology Recent Labs  Lab 10/31/21 0530 11/01/21 0057 11/02/21 0117  WBC 9.3 8.1 7.2  RBC 3.44* 3.56* 3.70*  HGB 10.0* 10.3* 10.7*  HCT 31.5* 33.0* 34.1*  MCV 91.6 92.7 92.2  MCH 29.1 28.9 28.9  MCHC 31.7 31.2 31.4  RDW 12.8 12.6 12.6  PLT 167 182 214   Thyroid No results for input(s): TSH, FREET4 in the last 168 hours.  BNPNo results for input(s): BNP, PROBNP in the last 168 hours.  DDimer No results for input(s): DDIMER in the last 168 hours.   Radiology    DG Chest 2 View  Result Date: 11/02/2021 CLINICAL DATA:  Atelectasis. EXAM: CHEST - 2 VIEW COMPARISON:  Chest x-ray from yesterday. FINDINGS: Stable cardiomediastinal silhouette status post CABG. Unchanged small bilateral pleural effusions. Mildly improved aeration of the lung bases. No pneumothorax. No acute osseous abnormality. IMPRESSION: 1. Unchanged small bilateral pleural effusions. Mildly improved bibasilar atelectasis.  Electronically Signed   By: Titus Dubin M.D.   On: 11/02/2021 08:40    Cardiac Studies   LEFT HEART CATH AND CORONARY ANGIOGRAPHY    Conclusion       Ost RCA lesion is 80% stenosed.   Prox RCA to Mid RCA lesion is 40% stenosed.   Mid LM to Dist LM lesion is 90% stenosed.   Ost LAD to Mid LAD lesion is 50% stenosed.   Previously placed Ost RCA to Prox RCA stent (unknown type) is  widely patent.   IMPRESSIONS     1. Left ventricular ejection fraction, by estimation, is 40 to 45%. The  left ventricle has mildly decreased function. The left ventricle  demonstrates global hypokinesis. There is mild left ventricular  hypertrophy of the basal-septal segment. Left  ventricular diastolic parameters are consistent with Grade II diastolic  dysfunction (pseudonormalization).   2. Right ventricular systolic function is normal. The right ventricular  size is normal. There is normal pulmonary artery systolic pressure.   3. Left atrial size was moderately dilated.   4. The mitral valve is normal in structure. Trivial mitral valve  regurgitation. No evidence of mitral stenosis.   5. Aortic valve stenosis is mild by peak velocity and mean gradient.  However, the valve is severely calcified and visually, restricted. Given  that the LVEF is reduced, the mean gradient is likely underestimating the  severity of stenosis due to low  flow-low gradient. Dimentionless index is 0.36 and valve area by  planimetry is 1.79 cm^2, more consistent with moderate aortic stenosis.  The aortic valve is calcified. There is severe calcifcation of the aortic  valve. There is severe thickening of the  aortic valve. Aortic valve regurgitation is not visualized. Moderate  aortic  valve stenosis. Aortic valve area, by VTI measures 1.12 cm. Aortic  valve mean gradient measures 16.0 mmHg. Aortic valve Vmax measures 2.91  m/s.   6. The inferior vena cava is normal in size with greater than 50%  respiratory  variability, suggesting right atrial pressure of 3 mmHg.    Patient Profile     75 y.o. male with pmh PAD, CAD with prior stent, HTN, HLD, CKD stage 3, PAD with multiple procedures, former smoker who presented with chest pain  Assessment & Plan    CAD s/p CABG x 3 and prior stenting - reported UA in cardiology office and was set up [facility] which showed severe left main coronary artery stenosis and 2V CAD, patent RCA stent. but he appeared to have an 80% ostial RCA stenosis.  There was a 90% distal left main coronary stenosis followed by 40 to 50% since then stenoses in the LAD.  The circumflex coronary system did not have any significant obstructive disease.  - Saw CTTS and underwent CABG x3 10/27/21 with LIMA to LAD, SVG to OM, and SVG to PDA.   - No Aspirin with Eliquis for PAF - he has allergy to statins, on Zetia and Repatha - continue Lopressor.  - will need cardiac rehab as OP>>patient says he will do this at home - will arrange OP cardiology follow-up.   Volume overload HFrEF - Echo 08/2021 showed LVEF 50-55%, mild Mitral insufficieny and mild AS. - Echo this admission showed LVEF 40-45%. intraopTEE showed LVEF 35-40% - briefly on IV lasix - continue Lopressor 50mg  BID - will start Losartan 25mg  BID, needs BMET in a week. Can possible transition to [facility] as outpatient - continue GDMT as tolerated  Paroxysmal Afib - post-op paroxysmal Afib treated with IV amiodarone with subsequent conversion to NSR - CHADSVASC at least 3 (HTN, CHF, PAD) - patient had recurrence and was started on Eliquis 5mg  BID - continue Lopressor for rate control - amiodarone 400mg  BIDx7 days>200mg  BID x7 days>200mg  thereafter. Unsure he will require long-term amiodarone therapy - CBC at follow-up  HTN - lopressor 50mg  BID - BP elevated, will start Losartan as above  AKI/CKD stage 3a - post-cath Scr peaked to 1.89.  - Scr around baseline at 1.44, bUN 25  HLD - LDL 30 - allergy to statins -  continue Zetia - PTA Repatha  AS - mild to mod by echo this admission - can follow with serial echo  PAD s/p multiple interventions - follows with Dr. Gwenlyn Found as outpatient  For questions or updates, please contact [facility] Please consult [email or web address] for contact info under        Signed, Cadence Ninfa Meeker, PA-C  11/03/2021, 10:23 AM     I did not see this patient before he left today.   Lauree Chandler 11/03/2021 11:32 AM

## 2021-11-07 ENCOUNTER — Telehealth: Payer: Self-pay

## 2021-11-07 ENCOUNTER — Telehealth: Payer: Self-pay | Admitting: Cardiovascular Disease

## 2021-11-07 NOTE — Telephone Encounter (Signed)
Spoke with patient about NPO requirements before his procedures tomorrow. He handed the phone to his caregiver and I explained that the patient should not eat after midnight and can only have clear liquids up until 5 am. The caregiver stated the patient had concerns about being turned too many times during the procedures due to CABG. She said she was holding the phone so the patient could hear what I said. I let them know that the technologist will explain the procedure positioning to the patient, and that the patient may voice any concerns to the tech tomorrow. The caregiver thanked me for the information.

## 2021-11-07 NOTE — Telephone Encounter (Signed)
Patients daughter called and wants someone to confirm that the patient isn't supposed to eat or drink anything before his test tomorrow. Patient is having LE Arterial and Carotid done.

## 2021-11-07 NOTE — Telephone Encounter (Signed)
Left detailed message for pt with instructions for LEAs with ABIs and carotid ultrasounds to be done tomorrow. Advised pt to call back with questions.

## 2021-11-08 ENCOUNTER — Other Ambulatory Visit: Payer: Self-pay | Admitting: *Deleted

## 2021-11-08 ENCOUNTER — Other Ambulatory Visit: Payer: Self-pay

## 2021-11-08 ENCOUNTER — Telehealth: Payer: Self-pay

## 2021-11-08 ENCOUNTER — Ambulatory Visit (HOSPITAL_COMMUNITY)
Admission: RE | Admit: 2021-11-08 | Discharge: 2021-11-08 | Disposition: A | Discharge status: Home / Self Care | Payer: Medicare Other | Source: Ambulatory Visit | Attending: Cardiology | Admitting: Cardiology

## 2021-11-08 ENCOUNTER — Other Ambulatory Visit (HOSPITAL_COMMUNITY): Payer: Self-pay | Admitting: Cardiovascular Disease

## 2021-11-08 DIAGNOSIS — I6523 Occlusion and stenosis of bilateral carotid arteries: Secondary | ICD-10-CM | POA: Diagnosis not present

## 2021-11-08 DIAGNOSIS — E785 Hyperlipidemia, unspecified: Secondary | ICD-10-CM | POA: Diagnosis not present

## 2021-11-08 DIAGNOSIS — I739 Peripheral vascular disease, unspecified: Secondary | ICD-10-CM

## 2021-11-08 DIAGNOSIS — Z95828 Presence of other vascular implants and grafts: Secondary | ICD-10-CM

## 2021-11-08 DIAGNOSIS — Z9889 Other specified postprocedural states: Secondary | ICD-10-CM

## 2021-11-08 DIAGNOSIS — K219 Gastro-esophageal reflux disease without esophagitis: Secondary | ICD-10-CM | POA: Diagnosis not present

## 2021-11-08 DIAGNOSIS — M810 Age-related osteoporosis without current pathological fracture: Secondary | ICD-10-CM | POA: Diagnosis not present

## 2021-11-08 DIAGNOSIS — Z951 Presence of aortocoronary bypass graft: Secondary | ICD-10-CM

## 2021-11-08 DIAGNOSIS — I10 Essential (primary) hypertension: Secondary | ICD-10-CM | POA: Diagnosis not present

## 2021-11-08 NOTE — Telephone Encounter (Signed)
Left message for pt to call back  °

## 2021-11-08 NOTE — Progress Notes (Signed)
Patient's family member, Joshua Wyatt, contacted the office with c/o bilateral lower extremity swelling. Patient is s/p CABG 11/18 by Dr. Roxan Hockey. Per family, patient's bilateral ankles and feet are swollen. Denies redness, warmth, or pain. States feet and ankles are "tight". Patient's weight at hospital discharge was 167lb. Yesterday's weight 165.6lb, today's weight 169lb. Patient c/o SOB when lying flat on his back. Denies SOB at rest. Per E. Barrett, PA, Joshua Wyatt advised to continue to monitor weight daily. If weight goes up tomorrow, patient advised to get BMP drawn at [place] and to notify office. Patient advised to elevate feet at rest, wear compression socks, and monitor salt intake. [name] verbalizes understanding.

## 2021-11-09 ENCOUNTER — Telehealth (HOSPITAL_COMMUNITY): Payer: Self-pay

## 2021-11-09 ENCOUNTER — Telehealth: Payer: Self-pay | Admitting: Cardiovascular Disease

## 2021-11-09 NOTE — Telephone Encounter (Signed)
Returned call to patient who states he was returning call from yesterday. Patient states that he spoke with someone at [place] regarding his swelling in his legs. Patient recently had a CABG with Dr. Roxan Hockey on 11/18. Patient states that his weight today is 165.5lb and denies any shortness of breath at this time and denies any weight gain. Patient states that his feet are just a little swollen and tight.  Patients care giver states that he feels better than yesterday. Patient has appointment with Dr. Gwenlyn Found tomorrow at [place] patient to keep that appointment and to call back with any concerns. Will forward to MD to make aware.  Patient verbalized understanding.

## 2021-11-09 NOTE — Telephone Encounter (Signed)
Pt returning phone call... please advise  

## 2021-11-09 NOTE — Telephone Encounter (Signed)
Attempted to call patient in regards to Cardiac Rehab - LM on VM 

## 2021-11-09 NOTE — Telephone Encounter (Signed)
Will check insurance benefits closer to scheduling and/or into the new year 2023. 

## 2021-11-10 ENCOUNTER — Ambulatory Visit: Payer: Medicare Other | Admitting: Cardiovascular Disease

## 2021-11-10 ENCOUNTER — Telehealth (HOSPITAL_COMMUNITY): Payer: Self-pay

## 2021-11-10 ENCOUNTER — Encounter: Payer: Self-pay | Admitting: Cardiovascular Disease

## 2021-11-10 ENCOUNTER — Other Ambulatory Visit: Payer: Self-pay

## 2021-11-10 DIAGNOSIS — R600 Localized edema: Secondary | ICD-10-CM | POA: Diagnosis not present

## 2021-11-10 DIAGNOSIS — Z951 Presence of aortocoronary bypass graft: Secondary | ICD-10-CM | POA: Diagnosis not present

## 2021-11-10 DIAGNOSIS — I350 Nonrheumatic aortic (valve) stenosis: Secondary | ICD-10-CM

## 2021-11-10 DIAGNOSIS — I480 Paroxysmal atrial fibrillation: Secondary | ICD-10-CM | POA: Diagnosis not present

## 2021-11-10 MED ORDER — AMIODARONE HCL 200 MG PO TABS: 200.0000 mg | ORAL_TABLET | Freq: Every day | ORAL | 1 refills | Status: DC

## 2021-11-10 MED ORDER — FUROSEMIDE 20 MG PO TABS: 20.0000 mg | ORAL_TABLET | Freq: Every day | ORAL | 1 refills | Status: DC

## 2021-11-10 MED ORDER — EZETIMIBE 10 MG PO TABS: 10.0000 mg | ORAL_TABLET | Freq: Every day | ORAL | 3 refills | Status: AC

## 2021-11-10 NOTE — Addendum Note (Signed)
Addended by: Beatrix Fetters on: 11/10/2021 12:18 PM   Modules accepted: Orders

## 2021-11-10 NOTE — Assessment & Plan Note (Signed)
Mild to moderate by 2D echo.  The decision was made to follow this annually by 2D echocardiograms.  The patient did not have aortic valve replacement at the time of bypass surgery.

## 2021-11-10 NOTE — Patient Instructions (Addendum)
Medication Instructions:   -Start furosemide (lasix) 20mg  once daily.  -Amiodarone (Pacerone) 200mg  once daily.  *If you need a refill on your cardiac medications before your next appointment, please call your pharmacy*   Lab Work: Your physician recommends that you have labs drawn today: BMET  Your physician recommends that you return for lab work in: 7-10 days for BMET  If you have labs (blood work) drawn today and your tests are completely normal, you will receive your results only by: [place] (if you have MyChart) OR A paper copy in the mail If you have any lab test that is abnormal or we need to change your treatment, we will call you to review the results.   Testing/Procedures: Your physician has requested that you have an echocardiogram. Echocardiography is a painless test that uses sound waves to create images of your heart. It provides your doctor with information about the size and shape of your heart and how well your heart's chambers and valves are working. This procedure takes approximately one hour. There are no restrictions for this procedure. To be done in Feb 2023. (Prior to appointment with Dr. Gwenlyn Found) This procedure is done at [address].    Follow-Up: At [facility], you and your health needs are our priority.  As part of our continuing mission to provide you with exceptional heart care, we have created designated Provider Care Teams.  These Care Teams include your primary Cardiologist (physician) and Advanced Practice Providers (APPs -  Physician Assistants and Nurse Practitioners) who all work together to provide you with the care you need, when you need it.  We recommend signing up for the patient portal called "MyChart".  Sign up information is provided on this After Visit Summary.  MyChart is used to connect with patients for Virtual Visits (Telemedicine).  Patients are able to view lab/test results, encounter notes, upcoming appointments, etc.   Non-urgent messages can be sent to your provider as well.   To learn more about what you can do with MyChart, go to [email or web address].    Your next appointment:   1 month(s)  The format for your next appointment:   In Person  Provider:   Coletta Memos, FNP, Fabian Sharp, PA-C, Sande Rives, PA-C, Caron Presume, PA-C, Jory Sims, DNP, ANP, or Almyra Deforest, PA-C     Then, [name], MD will plan to see you again in 3 month(s).

## 2021-11-10 NOTE — Telephone Encounter (Signed)
Pt called back and stated that he is interested in the cardiac rehab program. I advised pt that we have a backlog of 1-3 months and that we will give him a call after the 1st of the new year to go over insurance benefits and to get him scheduled.

## 2021-11-10 NOTE — Assessment & Plan Note (Signed)
New since his surgery.  He does have moderate renal insufficiency.  I am going to put him on furosemide 20 mg a day, will check a basic metabolic profile panel in 7 to 10 days and will have him see a APP back in 1 month for follow-up

## 2021-11-10 NOTE — Progress Notes (Signed)
11/10/2021 Joshua Wyatt   October 06, 1946  [id number]  Primary Physician Janie Morning, DO Primary Cardiologist: Lorretta Harp MD FACP, [place], [place], [place]  HPI:  Joshua Wyatt is a 75 y.o.  thin appearing widowed Caucasian male with no children who I last saw in the office 10/24/2021.  Unfortunately, his wife of 24 years died at the age of 42 on 10-11-2020.  He is currently living alone.  He has a history of PVOD status post bilateral carotid endarterectomies performed by Dr. Drucie Opitz in 2007. We have been following him by duplex ultrasound which performed most recently several days ago revealed this to be widely patent. He has also had bilateral SFA intervention as well as right renal artery stenting. His other problems include hypertension and hyperlipidemia. He did have an episode of chest heaviness recently, but more importantly he has complained of progressive lifestyle limiting claudication since I last saw him. Most recent lower extremity Dopplers reveal a right ABI of 0.93 and a left of 0.82. He does appear to have moderate iliac disease as well as high grade right SFA and popliteal stenosis. He underwent abdominal aortography with bifemoral runoff on 03/30/13 revealed a widely patent right renal artery stent, 50-60% proximal left renal artery stenosis. The left SFA was while he stayed patent and he had a 95% focal above-the-knee popliteal artery stenosis with three-vessel runoff. There was a 60% focal lesion in the right common iliac artery and a 50% segmental proximal to mid right SFA stenosis with a patent stent in the mid to distal right SFA. He also had a 50% right popliteal stenosis with three-vessel runoff. He underwent cutting balloon angioplasty of the left above-the-knee popliteal artery and Angiosculpt balloon with an excellent angiographic result and was discharged the following day. I performed angiography on him 10/11/14 and recanalized a subtotally occluded right SFA with  overlapping via Bahn covered stent performed intervention on his above-the-knee popliteal artery on that side. He had a patent mid to distal left SFA stent with high grade segmental and 6 sequential disease in the proximal SFA and distal SFA on the left. Since I saw him a year and a half ago he's had progressive lifestyle limiting claudication in his left calf. Addition, he's had severe progression of disease in his right internal carotid artery. I performed carotid stenting on him 01/03/17 with an excellent result. He underwent left lower extremity angiography by myself 02/03/17 revealing an occluded distal left SFA stent with otherwise high-grade segmental disease proximal to this and occluded anterior tibial. I performed cutting balloon atherectomy of his in-stent restenosis and the surrounding disease excellent angiographic result. His claudication has completely resolved. His left ABI has improved from 0.59 up to 1.   Because of recurrent claudication I re-angiogram him 05/11/2018 revealing a patent mid left SFA stent with high-grade segmental proximal left SFA stenosis which I performed Hawk 1 directional atherectomy followed by drug-eluting balloon angioplasty.  Claudication resolved and his Dopplers have normalized.  He did have RCA intervention by myself 03/13/2018 with a drug-eluting stent.  He currently denies chest pain, shortness of breath or claudication.  He had elective right left total knee replacement by Dr. Gladstone Lighter in June of this year which was uncomplicated.   Since I saw him in the office 3 weeks ago I did perform outpatient diagnostic cath on him 10/26/2021 because of accelerated chest pain revealing distal left main disease.  The following day he underwent CABG x3 by Dr. Roxan Hockey  with a LIMA to his LAD, vein to obtuse marginal branch and PDA.  He was discharged home on 11/03/2021.  He is continuing to improve.  He does have 1-2+ bilateral lower extremity edema.  His hospitalization was  notable for perioperative A. fib converted to sinus rhythm on amiodarone.  He was placed on Eliquis oral anticoagulation.   Current Meds  Medication Sig   acetaminophen (TYLENOL) 500 MG tablet Take 500-1,000 mg by mouth every 6 (six) hours as needed (for pain).   apixaban (ELIQUIS) 5 MG TABS tablet Take 1 tablet (5 mg total) by mouth 2 (two) times daily.   aspirin EC 81 MG tablet Take 162 mg by mouth in the morning. Swallow whole.   docusate sodium (COLACE) 100 MG capsule Take 1 capsule (100 mg total) by mouth 2 (two) times daily.   furosemide (LASIX) 20 MG tablet Take 1 tablet (20 mg total) by mouth daily.   losartan (COZAAR) 25 MG tablet Take 1 tablet (25 mg total) by mouth daily.   magnesium hydroxide (MILK OF MAGNESIA) 400 MG/5ML suspension Take 15-30 mLs by mouth daily as needed for mild constipation.   methocarbamol (ROBAXIN) 500 MG tablet Take 1 tablet (500 mg total) by mouth every 6 (six) hours as needed for muscle spasms.   metoprolol tartrate (LOPRESSOR) 50 MG tablet Take 1 tablet (50 mg total) by mouth 2 (two) times daily.   nitroGLYCERIN (NITROSTAT) 0.4 MG SL tablet PLACE 1 TABLET UNDER THE TONGUE  EVERY  5  MINUTES  AS  NEEDED  FOR  CHEST  PAIN   pantoprazole (PROTONIX) 40 MG tablet Take 1 tablet by mouth once daily   polyethylene glycol (MIRALAX / GLYCOLAX) 17 g packet Take 17 g by mouth daily as needed for mild constipation.   [place] 420 MG/3.5ML SOCT Inject 420 mg into the skin every 30 (thirty) days.   tamsulosin (FLOMAX) 0.4 MG CAPS capsule Take 0.4 mg by mouth at bedtime.   [DISCONTINUED] amiodarone (PACERONE) 200 MG tablet Take 2 tablets (400 mg total) by mouth 2 (two) times daily. For 3 days then decrease the dose to 1 tablet ($RemoveB'200mg'[id number]$ ) by mouth twice daily.   [DISCONTINUED] ezetimibe (ZETIA) 10 MG tablet Take 1 tablet by mouth once daily     Allergies  Allergen Reactions   Ezetimibe     leg pains   Statins Swelling and Other (See Comments)    Muscle  pain, also    Social History   Socioeconomic History   Marital status: Married    Spouse name: Not on file   Number of children: Not on file   Years of education: Not on file   Highest education level: Not on file  Occupational History   Not on file  Tobacco Use   Smoking status: Former    Packs/day: 1.00    Years: 58.00    Pack years: 58.00    Types: Cigars, Cigarettes    Quit date: 10/18/2013    Years since quitting: 8.0   Smokeless tobacco: Never  Vaping Use   Vaping Use: Never used  Substance and Sexual Activity   Alcohol use: Not Currently   Drug use: Never   Sexual activity: Yes  Other Topics Concern   Not on file  Social History Narrative   Not on file   Social Determinants of Health   Financial Resource Strain: Not on file  Food Insecurity: Not on file  Transportation Needs: Not on file  Physical Activity: Not  on file  Stress: Not on file  Social Connections: Not on file  Intimate Partner Violence: Not on file     Review of Systems: General: negative for chills, fever, night sweats or weight changes.  Cardiovascular: negative for chest pain, dyspnea on exertion, edema, orthopnea, palpitations, paroxysmal nocturnal dyspnea or shortness of breath Dermatological: negative for rash Respiratory: negative for cough or wheezing Urologic: negative for hematuria Abdominal: negative for nausea, vomiting, diarrhea, bright red blood per rectum, melena, or hematemesis Neurologic: negative for visual changes, syncope, or dizziness All other systems reviewed and are otherwise negative except as noted above.    Blood pressure (!) 142/70, pulse (!) 52, height 5' 7.5" (1.715 m), weight 167 lb 9.6 oz (76 kg), SpO2 98 %.  General appearance: alert and no distress Neck: no adenopathy, no JVD, supple, symmetrical, trachea midline, thyroid not enlarged, symmetric, no tenderness/mass/nodules, and bilateral carotid bruits Lungs: clear to auscultation bilaterally Heart: Soft  outflow tract murmur consistent with aortic stenosis Extremities: 1-2+ bilateral lower extremity edema Pulses: Diminished pedal pulses Skin: Skin color, texture, turgor normal. No rashes or lesions Neurologic: Grossly normal  EKG sinus bradycardia 52 with a bundle branch block which is new since his prior EKGs.  I personally reviewed this EKG.  ASSESSMENT AND PLAN:   S/P CABG x 3 History of CAD status post RCA intervention by myself 03/13/2018.  Because of progressive chest pain I performed outpatient diagnostic coronary angiography on him 10/26/2021 revealing significant left main disease with a patent RCA stent.  He underwent CABG x3 by Dr. Roxan Hockey 10/27/2021 with a LIMA to the LAD, vein to an obtuse marginal branch and PDA.  He was discharged home on 11/03/2021.  Bilateral lower extremity edema New since his surgery.  He does have moderate renal insufficiency.  I am going to put him on furosemide 20 mg a day, will check a basic metabolic profile panel in 7 to 10 days and will have him see a APP back in 1 month for follow-up  PAF (paroxysmal atrial fibrillation) (HCC) Perioperative PAF converted to sinus rhythm currently on amiodarone load and Eliquis.  He is in sinus rhythm today.  Does have a left bundle branch block which is new.  I am going to cut his amiodarone from 200 twice daily to 200 a day.  When I see him back in 3 months we may discontinue this as well as the Eliquis.  Aortic stenosis Mild to moderate by 2D echo.  The decision was made to follow this annually by 2D echocardiograms.  The patient did not have aortic valve replacement at the time of bypass surgery.     Lorretta Harp MD FACP,FACC,FAHA, [facility] 11/10/2021 11:43 AM

## 2021-11-10 NOTE — Assessment & Plan Note (Signed)
History of CAD status post RCA intervention by myself 03/13/2018.  Because of progressive chest pain I performed outpatient diagnostic coronary angiography on him 10/26/2021 revealing significant left main disease with a patent RCA stent.  He underwent CABG x3 by Dr. Roxan Hockey 10/27/2021 with a LIMA to the LAD, vein to an obtuse marginal branch and PDA.  He was discharged home on 11/03/2021.

## 2021-11-10 NOTE — Assessment & Plan Note (Signed)
Perioperative PAF converted to sinus rhythm currently on amiodarone load and Eliquis.  He is in sinus rhythm today.  Does have a left bundle branch block which is new.  I am going to cut his amiodarone from 200 twice daily to 200 a day.  When I see him back in 3 months we may discontinue this as well as the Eliquis.

## 2021-11-11 LAB — BASIC METABOLIC PANEL
BUN/Creatinine Ratio: 12 (ref 10–24)
BUN: 21 mg/dL (ref 8–27)
CO2: 21 mmol/L (ref 20–29)
Calcium: 10.2 mg/dL (ref 8.6–10.2)
Chloride: 103 mmol/L (ref 96–106)
Creatinine, Ser: 1.82 mg/dL — ABNORMAL HIGH (ref 0.76–1.27)
Glucose: 94 mg/dL (ref 70–99)
Potassium: 4.8 mmol/L (ref 3.5–5.2)
Sodium: 140 mmol/L (ref 134–144)
eGFR: 38 mL/min/{1.73_m2} — ABNORMAL LOW (ref >59)

## 2021-11-13 ENCOUNTER — Other Ambulatory Visit (HOSPITAL_COMMUNITY): Payer: Self-pay

## 2021-11-13 ENCOUNTER — Telehealth (HOSPITAL_COMMUNITY): Payer: Self-pay

## 2021-11-13 NOTE — Telephone Encounter (Signed)
Pharmacy Transitions of Care Follow-up Telephone Call  Date of discharge: 11/01/21  Discharge Diagnosis: CABG  How have you been since you were released from the hospital?  Patient doing well since discharge, no questions about meds at this time.  Medication changes made at discharge:  - START: Eliquis  Medication changes verified by the patient? Yes    Medication Accessibility:  Home Pharmacy:  [facility]  Was the patient provided with refills on discharged medications? Yes   Have all prescriptions been transferred from [facility] to home pharmacy?  Yes  Is the patient able to afford medications? Has insurance    Medication Review:  APIXABAN (ELIQUIS)  Apixaban 5 mg BID initiated on 11/03/21.  - Discussed importance of taking medication around the same time everyday  - Advised patient of medications to avoid (NSAIDs, ASA)  - Educated that Tylenol (acetaminophen) will be the preferred analgesic to prevent risk of bleeding  - Emphasized importance of monitoring for signs and symptoms of bleeding (abnormal bruising, prolonged bleeding, nose bleeds, bleeding from gums, discolored urine, black tarry stools)  - Advised patient to alert all providers of anticoagulation therapy prior to starting a new medication or having a procedure   Follow-up Appointments:  [facility] f/u appt confirmed? Scheduled to see Dr. Purcell Nails on 12/22/21  If their condition worsens, is the pt aware to call PCP or go to the Emergency Dept.? yes  Final Patient Assessment: Patient has follow up scheduled and refills at home pharmacy

## 2021-11-15 ENCOUNTER — Telehealth: Payer: Self-pay | Admitting: Cardiovascular Disease

## 2021-11-15 NOTE — Telephone Encounter (Signed)
Left message for pt to call.

## 2021-11-15 NOTE — Telephone Encounter (Signed)
Pt c/o medication issue:  1. Name of Medication: metoprolol tartrate (LOPRESSOR) 50 MG tablet  2. How are you currently taking this medication (dosage and times per day)? Take 1 tablet (50 mg total) by mouth 2 (two) times daily.  3. Are you having a reaction (difficulty breathing--STAT)? no  4. What is your medication issue? Calling with the concerns that patient might be taking too much medication. Because he is always tiredness and sleepy.

## 2021-11-21 ENCOUNTER — Encounter (HOSPITAL_COMMUNITY): Payer: Medicare Other

## 2021-11-21 NOTE — Telephone Encounter (Signed)
Called patient. Advised of message below.  Patient daughter verbalized understanding.

## 2021-11-21 NOTE — Telephone Encounter (Signed)
Called patient daughter- she states that her dad is doing better, but they did decrease the Metoprolol dose themselves as he was not himself. He was sleeping all the time, very tired, no energy. They decreased him to just 50 once daily instead of twice daily, and he is doing much better. She states he was scheduled for a RENAL US today with [place], but it shows it was cancelled, and I am unsure of the reason and the daughter was not aware. Patient came here and was told it was cancelled. I advised I would reach out about this to figure out what was going on and if we could get it rescheduled.   Also they are requesting if patient should stay on the 50 daily Metoprolol, or change to something else. I advised either myself or his nurse would call back with recommendations from MD. Patient daughter thankful for call back.

## 2021-11-27 ENCOUNTER — Other Ambulatory Visit: Payer: Self-pay | Admitting: Thoracic Surgery (Cardiothoracic Vascular Surgery)

## 2021-11-27 DIAGNOSIS — Z951 Presence of aortocoronary bypass graft: Secondary | ICD-10-CM

## 2021-11-28 ENCOUNTER — Ambulatory Visit
Admission: RE | Admit: 2021-11-28 | Discharge: 2021-11-28 | Disposition: A | Discharge status: Home / Self Care | Payer: Medicare Other | Source: Ambulatory Visit | Attending: Thoracic Surgery (Cardiothoracic Vascular Surgery) | Admitting: Thoracic Surgery (Cardiothoracic Vascular Surgery)

## 2021-11-28 ENCOUNTER — Other Ambulatory Visit: Payer: Self-pay

## 2021-11-28 ENCOUNTER — Encounter: Payer: Self-pay | Admitting: Thoracic Surgery (Cardiothoracic Vascular Surgery)

## 2021-11-28 ENCOUNTER — Ambulatory Visit (INDEPENDENT_AMBULATORY_CARE_PROVIDER_SITE_OTHER): Payer: Self-pay | Admitting: Thoracic Surgery (Cardiothoracic Vascular Surgery)

## 2021-11-28 ENCOUNTER — Other Ambulatory Visit: Payer: Self-pay | Admitting: Thoracic Surgery (Cardiothoracic Vascular Surgery)

## 2021-11-28 VITALS — BP 179/72 | HR 53 | Resp 20 | Ht 67.5 in | Wt 167.0 lb

## 2021-11-28 DIAGNOSIS — Z951 Presence of aortocoronary bypass graft: Secondary | ICD-10-CM | POA: Diagnosis not present

## 2021-11-28 DIAGNOSIS — I517 Cardiomegaly: Secondary | ICD-10-CM | POA: Diagnosis not present

## 2021-11-28 DIAGNOSIS — M5134 Other intervertebral disc degeneration, thoracic region: Secondary | ICD-10-CM | POA: Diagnosis not present

## 2021-11-28 NOTE — Progress Notes (Signed)
[address]       [place] [address]             [phone]     HPI: [name] returns for follow-up after recent coronary bypass grafting  Joshua Wyatt is a 75 year old man with a history of CAD, previous PCI, hypertension, hyperlipidemia, stage III chronic kidney disease, PAD with history of bilateral carotid endarterectomies, DJD, and former smoker.  He is a 58-pack-year history of smoking prior to quitting 8 years ago.  He presented with exertional chest pain.  He underwent cardiac catheterization which showed severe left main coronary disease and also had a tight ostial right coronary stenosis.  He underwent coronary bypass grafting x3 on 10/27/2021.  His postoperative course was complicated by atrial fibrillation.  He went home on amiodarone and Eliquis.  He was in sinus rhythm at discharge.  For the first couple of weeks after he was home he felt very tired all the time he did not have much energy.  He would wear out during the day.  He was sleeping a lot during the day but not sleeping well at night.  He says he has noticed that these things have started to improve over the past week, but he is not back to normal yet.  He is not having any significant pain and is not taking any narcotics.  He did decrease his Lopressor to 1 tablet a day after talking with Dr. Kennon Holter office.  Past Medical History:  Diagnosis Date   Arthritis    "LITTLE IN MY LEFT HAND" (05/12/2018)   Bilateral calf pain 03/02/2013   lower ext.doppler-mild arterial insufficiency to lower ext. this is a disease in val   CAD (coronary artery disease)    a. cath 02/24/18 - 90% ost RCA; 40% ost & pro LAD   Cerebral atherosclerosis 03/02/2013   carotid duplex- normal study   Deviated septum    RIGHT   GERD (gastroesophageal reflux disease)    Heart murmur    History of echocardiogram 08/02/2005   normal study    History of kidney stones    Hyperlipemia    Hypertension 03/30/2013   PV  Angiogram- rt. renal artery stent was widely open,50-60% lt. renal artery stenosis,lt. SFA stents patent with high grade above the knee  poplital stenosis   Hyponatremia 02/03/2017   Normal cardiac stress test 03/25/2013   EF 60%, normal stress test ,this is a presurgical  test   Peripheral vascular disease ([place])    PVD (peripheral vascular disease) ([place])    A. 2009: S/P PTA/stent to D SFA. B. 01/2017: angiosculpt atherectomy/drug-eluting balloon angioplasty to L SFA.   Renal artery stenosis ([place]) 03/02/2013   renal artery doppler-this was an abnormal doppler    Current Outpatient Medications  Medication Sig Dispense Refill   acetaminophen (TYLENOL) 500 MG tablet Take 500-1,000 mg by mouth every 6 (six) hours as needed (for pain).     amiodarone (PACERONE) 200 MG tablet Take 1 tablet (200 mg total) by mouth daily. For 3 days then decrease the dose to 1 tablet ($RemoveB'200mg'RfOHhorJ$ ) by mouth twice daily. 90 tablet 1   apixaban (ELIQUIS) 5 MG TABS tablet Take 1 tablet (5 mg total) by mouth 2 (two) times daily. 60 tablet 2   aspirin EC 81 MG tablet Take 162 mg by mouth in the morning. Swallow whole.     docusate sodium (COLACE) 100 MG capsule Take 1 capsule (100 mg total) by mouth  2 (two) times daily. 10 capsule 0   ezetimibe (ZETIA) 10 MG tablet Take 1 tablet (10 mg total) by mouth daily. 90 tablet 3   furosemide (LASIX) 20 MG tablet Take 1 tablet (20 mg total) by mouth daily. 90 tablet 1   losartan (COZAAR) 25 MG tablet Take 1 tablet (25 mg total) by mouth daily. 30 tablet 11   magnesium hydroxide (MILK OF MAGNESIA) 400 MG/5ML suspension Take 15-30 mLs by mouth daily as needed for mild constipation.     methocarbamol (ROBAXIN) 500 MG tablet Take 1 tablet (500 mg total) by mouth every 6 (six) hours as needed for muscle spasms. 40 tablet 0   metoprolol tartrate (LOPRESSOR) 50 MG tablet Take 1 tablet (50 mg total) by mouth 2 (two) times daily. 60 tablet 5   nitroGLYCERIN (NITROSTAT) 0.4 MG SL tablet PLACE 1  TABLET UNDER THE TONGUE  EVERY  5  MINUTES  AS  NEEDED  FOR  CHEST  PAIN 25 tablet 0   pantoprazole (PROTONIX) 40 MG tablet Take 1 tablet by mouth once daily 90 tablet 3   polyethylene glycol (MIRALAX / GLYCOLAX) 17 g packet Take 17 g by mouth daily as needed for mild constipation. 14 each 0   REPATHA PUSHTRONEX SYSTEM 420 MG/3.5ML SOCT Inject 420 mg into the skin every 30 (thirty) days.     tamsulosin (FLOMAX) 0.4 MG CAPS capsule Take 0.4 mg by mouth at bedtime.     No current facility-administered medications for this visit.    Physical Exam BP (!) 179/72 (BP Location: Right Arm, Patient Position: Sitting, Cuff Size: Normal)    Pulse (!) 53    Resp 20    Ht 5' 7.5" (1.715 m)    Wt 167 lb (75.8 kg)    SpO2 96% Comment: RA   BMI 25.77 kg/m  Well-appearing 75 year old man in no acute distress Alert and oriented x3 with no focal deficits Lungs clear with equal breath sounds bilaterally Cardiac regular, bradycardic Sternum stable, incision clean dry and intact Leg incision healing well, no peripheral edema  Diagnostic Tests: CHEST - 2 VIEW   COMPARISON:  11/02/2021   FINDINGS: Cardiomegaly status post median sternotomy and CABG. Small left pleural effusion and/or pleural thickening. Disc degenerative disease of the thoracic spine.   IMPRESSION: 1.  Cardiomegaly.   2.  Small left pleural effusion and/or pleural thickening.     Electronically Signed   By: Delanna Ahmadi M.D.   On: 11/28/2021 14:44 I personally reviewed the chest x-ray images.  No concerning findings.  Impression: Joshua Wyatt is a 75 year old man with a history of CAD, previous PCI, hypertension, hyperlipidemia, stage III chronic kidney disease, PAD with history of bilateral carotid endarterectomies, DJD, and former smoker.  He presented with exertional chest pain and was found to have left main and ostial right disease.  He underwent coronary bypass grafting x3 on 10/27/2021.  He is now about a month out from  surgery.  He is not having any significant pain.  He has been able to drive without any issues.  Appropriate precautions were reviewed.  He should not lift anything over 10 pounds for another 2 weeks.  He did have postoperative atrial fibrillation and went home on amiodarone and Eliquis.  He will follow-up with Dr. Gwenlyn Found regarding that and may be able to stop those in 2 months if he does not have any additional atrial fibrillation.  Blood pressure was elevated today at 440 systolic.  His heart rate is  relatively low.  I did not make any medication changes as the blood pressure is often a moving target in the early postoperative phase.  Plan: Follow-up as scheduled with Dr. I will be happy to see Mr. Koenigs back at anytime in the future if I can be of any further assistance with his care.  [name], MD [facility] [phone]

## 2021-11-29 ENCOUNTER — Other Ambulatory Visit (HOSPITAL_COMMUNITY): Payer: Self-pay

## 2021-11-29 DIAGNOSIS — I10 Essential (primary) hypertension: Secondary | ICD-10-CM

## 2021-12-01 ENCOUNTER — Other Ambulatory Visit (HOSPITAL_COMMUNITY): Payer: Self-pay | Admitting: Cardiovascular Disease

## 2021-12-01 ENCOUNTER — Encounter (HOSPITAL_COMMUNITY): Payer: Self-pay

## 2021-12-01 DIAGNOSIS — Z9889 Other specified postprocedural states: Secondary | ICD-10-CM

## 2021-12-01 NOTE — Telephone Encounter (Signed)
Pt called back and is not interested in the cardiac rehab program. Closed referral

## 2021-12-06 ENCOUNTER — Ambulatory Visit (HOSPITAL_BASED_OUTPATIENT_CLINIC_OR_DEPARTMENT_OTHER): Payer: Medicare Other

## 2021-12-06 ENCOUNTER — Other Ambulatory Visit: Payer: Self-pay

## 2021-12-06 DIAGNOSIS — I350 Nonrheumatic aortic (valve) stenosis: Secondary | ICD-10-CM | POA: Diagnosis not present

## 2021-12-06 DIAGNOSIS — Z9889 Other specified postprocedural states: Secondary | ICD-10-CM | POA: Diagnosis not present

## 2021-12-06 LAB — ECHOCARDIOGRAM COMPLETE
AR max vel: 1.23 cm2
AV Area VTI: 1.28 cm2
AV Area mean vel: 1.17 cm2
AV Mean grad: 12.5 mmHg
AV Peak grad: 26.4 mmHg
Ao pk vel: 2.57 m/s
Area-P 1/2: 1.72 cm2
Calc EF: 43.7 %
S' Lateral: 3.9 cm
Single Plane A2C EF: 41.7 %
Single Plane A4C EF: 41.3 %

## 2021-12-07 ENCOUNTER — Ambulatory Visit (HOSPITAL_COMMUNITY)
Admission: RE | Admit: 2021-12-07 | Discharge: 2021-12-07 | Disposition: A | Discharge status: Home / Self Care | Payer: Medicare Other | Source: Ambulatory Visit | Attending: Cardiology | Admitting: Cardiology

## 2021-12-07 ENCOUNTER — Ambulatory Visit (HOSPITAL_BASED_OUTPATIENT_CLINIC_OR_DEPARTMENT_OTHER)
Admission: RE | Admit: 2021-12-07 | Discharge: 2021-12-07 | Disposition: A | Discharge status: Home / Self Care | Payer: Medicare Other | Source: Ambulatory Visit | Attending: Cardiovascular Disease | Admitting: Cardiovascular Disease

## 2021-12-07 DIAGNOSIS — I350 Nonrheumatic aortic (valve) stenosis: Secondary | ICD-10-CM | POA: Insufficient documentation

## 2021-12-07 DIAGNOSIS — I739 Peripheral vascular disease, unspecified: Secondary | ICD-10-CM

## 2021-12-07 DIAGNOSIS — Z9889 Other specified postprocedural states: Secondary | ICD-10-CM | POA: Diagnosis not present

## 2021-12-07 DIAGNOSIS — I701 Atherosclerosis of renal artery: Secondary | ICD-10-CM

## 2021-12-12 ENCOUNTER — Telehealth: Payer: Self-pay | Admitting: Cardiovascular Disease

## 2021-12-12 NOTE — Telephone Encounter (Signed)
Called patient, spoke with daughter she states that since patient started eliquis back in November he has been so sick. He has been nauseated, extreme fatigue, and they really think the medication is messing with his mind. They would like to switch to something else. They tried to give it some time but nothing has changed.   Patient daughter also needs to have RX changed on Metoprolol (once daily), and Amiodarone to 1 tablet twice daily. I advised I could update these once I got the recommendations for changing eliquis.   Thank you!

## 2021-12-12 NOTE — Telephone Encounter (Signed)
I would put more blame on the amiodarone for making him feel this way - both started at the same hospitalization.  He's currently on 200 mg bid.  Can we cut him back to 200 mg daily first before switching anticoagulants?

## 2021-12-12 NOTE — Telephone Encounter (Signed)
Pt c/o medication issue:  1. Name of Medication: apixaban (ELIQUIS) 5 MG TABS tablet  2. How are you currently taking this medication (dosage and times per day)? AS DIRECTED  3. Are you having a reaction (difficulty breathing--STAT)?   4. What is your medication issue? PT HAS BE "SICK AS A DOG" PER PT'S POA, NAUSEA FATIQUE SINCE STARTING THIS MEDICINE A FEW MONTHS AGO.  [name] WANTS TO KNOW IF IT IS AN ALTERNATIVE MEDICINE PT CAN TAKE?

## 2021-12-13 ENCOUNTER — Telehealth (HOSPITAL_COMMUNITY): Payer: Self-pay

## 2021-12-13 NOTE — Telephone Encounter (Signed)
Pt insurance is active and benefits verified through [facility] Medicare Co-pay 0, DED 0/0 met, out of pocket $3,600/0 met, co-insurance 0%. no pre-authorization required. Passport, 12/13/2021@3 :31pm, REF# [id number]

## 2021-12-15 NOTE — Telephone Encounter (Signed)
Called patient, spoke with daughter.  Advised of message from [facility] below.  Patient daughter verbalized understanding.

## 2021-12-21 NOTE — Progress Notes (Signed)
Office Visit    Patient Name: Joshua Wyatt Date of Encounter: 12/22/2021  Primary Care Provider:  Janie Morning, DO Primary Cardiologist:  [name], MD  Chief Complaint    76 year old male with a history of CAD s/p DES-RCA in 2019, s/p CABG x3 (10/2021), aortic stenosis, PAF, PAD, renal artery stenosis, hypertension, hyperlipidemia, bilateral carotid artery disease, CKD stage III, DDD, and GERD who presents for follow-up related to CAD s/p CABG x3 10/27/2021 and paroxysmal atrial fibrillation.  Past Medical History    Past Medical History:  Diagnosis Date   Arthritis    "LITTLE IN MY LEFT HAND" (05/12/2018)   Bilateral calf pain 03/02/2013   lower ext.doppler-mild arterial insufficiency to lower ext. this is a disease in val   CAD (coronary artery disease)    a. cath 02/24/18 - 90% ost RCA; 40% ost & pro LAD   Cerebral atherosclerosis 03/02/2013   carotid duplex- normal study   Deviated septum    RIGHT   GERD (gastroesophageal reflux disease)    Heart murmur    History of echocardiogram 08/02/2005   normal study    History of kidney stones    Hyperlipemia    Hypertension 03/30/2013   PV Angiogram- rt. renal artery stent was widely open,50-60% lt. renal artery stenosis,lt. SFA stents patent with high grade above the knee  poplital stenosis   Hyponatremia 02/03/2017   Normal cardiac stress test 03/25/2013   EF 60%, normal stress test ,this is a presurgical  test   Peripheral vascular disease ([place])    PVD (peripheral vascular disease) ([place])    A. 2009: S/P PTA/stent to D SFA. B. 01/2017: angiosculpt atherectomy/drug-eluting balloon angioplasty to L SFA.   Renal artery stenosis ([place]) 03/02/2013   renal artery doppler-this was an abnormal doppler   Past Surgical History:  Procedure Laterality Date   APPENDECTOMY  1960's   CAROTID ENDARTERECTOMY Bilateral 2000's   CORONARY ANGIOPLASTY WITH STENT PLACEMENT  03/13/2018   CORONARY ARTERY BYPASS GRAFT N/A 10/27/2021    Procedure: CORONARY ARTERY BYPASS GRAFTING (CABG) X THREE ON PUMP USING LEFT INTERNAL MAMMARY ARTERY AND RIGHT ENDOSCOPIC GREATER SAPHENOUS VEIN CONDUITS;  Surgeon: [name], MD;  Location: [facility];  Service: Open Heart Surgery;  Laterality: N/A;   CORONARY STENT INTERVENTION N/A 03/13/2018   Procedure: CORONARY STENT INTERVENTION;  Surgeon: Lorretta Harp, MD;  Location: [facility];  Service: Cardiovascular;  Laterality: N/A;   CYSTOSCOPY W/ STONE MANIPULATION  "X 4 or 5"   ENDOVEIN HARVEST OF GREATER SAPHENOUS VEIN Right 10/27/2021   Procedure: ENDOVEIN HARVEST OF GREATER SAPHENOUS VEIN;  Surgeon: [name], MD;  Location: [facility];  Service: Open Heart Surgery;  Laterality: Right;   LEFT HEART CATH AND CORONARY ANGIOGRAPHY N/A 02/24/2018   Procedure: LEFT HEART CATH AND CORONARY ANGIOGRAPHY;  Surgeon: Lorretta Harp, MD;  Location: [facility];  Service: Cardiovascular;  Laterality: N/A;   LEFT HEART CATH AND CORONARY ANGIOGRAPHY N/A 10/26/2021   Procedure: LEFT HEART CATH AND CORONARY ANGIOGRAPHY;  Surgeon: Lorretta Harp, MD;  Location: [facility];  Service: Cardiovascular;  Laterality: N/A;   LITHOTRIPSY  "several"   LOWER EXTREMITY ANGIOGRAM N/A 10/11/2014   Procedure: LOWER EXTREMITY ANGIOGRAM;  Surgeon: Lorretta Harp, MD;  Location[facility];  Service: Cardiovascular;  Laterality: N/A;   LOWER EXTREMITY ANGIOGRAPHY N/A 02/04/2017   Procedure: Lower Extremity Angiography;  Surgeon: Lorretta Harp, MD;  Location: [facility];  Service: Cardiovascular;  Laterality: N/A;   LOWER EXTREMITY ANGIOGRAPHY N/A 05/12/2018   Procedure: LOWER EXTREMITY ANGIOGRAPHY;  Surgeon: Lorretta Harp, MD;  Location: [facility];  Service: Cardiovascular;  Laterality: N/A;   PERCUTANEOUS STENT INTERVENTION  10/11/2014   Procedure: PERCUTANEOUS STENT INTERVENTION;  Surgeon: Lorretta Harp, MD;  Location: [facility] C[facility];  Service: Cardiovascular;;   right sfax2   PERIPHERAL VASCULAR ATHERECTOMY  05/12/2018   Procedure: PERIPHERAL VASCULAR ATHERECTOMY;  Surgeon: Lorretta Harp, MD;  Location: [facility];  Service: Cardiovascular;;  left SFA   PERIPHERAL VASCULAR CATHETERIZATION N/A 01/03/2017   Procedure: Carotid PTA/Stent Intervention / Right;  Surgeon: Lorretta Harp, MD;  Location: [facility];  Service: Cardiovascular;  Laterality: N/A;   PERIPHERAL VASCULAR INTERVENTION Left 02/04/2017   Procedure: Peripheral Vascular Intervention;  Surgeon: Lorretta Harp, MD;  Location: [facility];  Service: Cardiovascular;  Laterality: Left;  left SFA   POPLITEAL ARTERY ANGIOPLASTY Left 03/30/2013   TEE WITHOUT CARDIOVERSION N/A 10/27/2021   Procedure: TRANSESOPHAGEAL ECHOCARDIOGRAM (TEE);  Surgeon: [name], MD;  Location: [facility];  Service: Open Heart Surgery;  Laterality: N/A;   TONSILLECTOMY  1960's   TOTAL KNEE ARTHROPLASTY Right 05/06/2019   Procedure: TOTAL KNEE ARTHROPLASTY;  Surgeon: Latanya Maudlin, MD;  Location: [facility];  Service: Orthopedics;  Laterality: Right;  161min   TOTAL KNEE ARTHROPLASTY Left 08/29/2021   Procedure: TOTAL KNEE ARTHROPLASTY;  Surgeon: Paralee Cancel, MD;  Location: [facility];  Service: Orthopedics;  Laterality: Left;    Allergies  Allergies  Allergen Reactions   Ezetimibe     leg pains   Statins Swelling and Other (See Comments)    Muscle pain, also    History of Present Illness   76 year old male with the above past medical history including CAD s/p DES-RCA in 2019, s/p CABG x3 (10/2021), aortic stenosis, PAF, PAD, renal artery stenosis, hypertension, hyperlipidemia, bilateral carotid artery disease, CKD stage III, DDD, and GERD.  Has an extensive history of PAD followed closely by Dr. Alvester Chou s/p L above-the-knee [place] angioplasty to L popliteal in 2015, s/p bilateral SFA stenting with repeat PTCA to distal left SFA in 2018 and PTCA to proximal L SFA in 2019.   Additionally, he has a history of renal artery stenosis s/p prior right renal artery stenting.  Has a history of bilateral carotid artery disease s/p bilateral carotid endarterectomy in 2007 and R ICA stenting in 2018.  He also has a history of CAD s/p DES-RCA in 2019.  He saw Dr. Gwenlyn Found in the office in October 24, 2021 and reported 3 to 4-week history of exertional chest pain similar to his prior anginal equivalent. He was scheduled for cardiac catheterization which revealed patent stent to RCA, ostRCA 80%, p-mRCA 405, o-mLAD 50%, and m-dLM 90% stenosis. The following day he underwent CABG x3 (LIMA-LAD, SVG-OM, PDA).  Echocardiogram showed an EF of 40- 45%, mildly decreased LV function, LV global hypokinesis, mild LVH of the basal septal segment, grade 2 diastolic dysfunction, moderate LAE, moderate AV stenosis. He did have postop A. Fib though he converted to normal sinus rhythm on amiodarone. He was started on Eliquis. He was discharged home on November 03, 2021.    He was last seen in the office on November 10, 2021 by Dr. Gwenlyn Found and was stable overall from a cardiac standpoint. He did have some increased bilateral lower extremity edema and was started on Lasix.  Additionally his amiodarone was reduced from 200 mg twice daily to 200 mg daily.  Renal ultrasound 12/07/21 showed no change from prior study, repeat bilateral ABIs/TBIs increased compared to prior study, repeat LE arterial duplex unchanged from prior study with recommended repeat studies in 1 year per Dr. Gwenlyn Found. He called our office on December 12, 2021 with complaints of nausea, fatigue since starting Eliquis and requested to switch anticoagulant. After discussion with the clinical pharmacist, it was thought that amiodarone was likely contributing to his symptoms rather than Eliquis.  His amiodarone was decreased to 200 mg daily.    He presents today for follow-up. Since his last visit he reports feeling better overall, though he still has some  general fatigue, and occasional lightheadedness. His heart rate is low today, 45 bpm. He was started on Lasix at his last visit and states he now feels like he is "dried out."  He has had occasional nosebleeds on Eliquis, though these resolve quickly.  He also complains of itching to his arms and back since he was in the hospital.  He states this happened during his last hospitalization and is uncertain of the cause.  Overall, he thinks that he is doing better.  He is back to driving and is getting ready to begin cardiac rehab.  Home Medications    Current Outpatient Medications  Medication Sig Dispense Refill   acetaminophen (TYLENOL) 500 MG tablet Take 500-1,000 mg by mouth every 6 (six) hours as needed (for pain).     amiodarone (PACERONE) 200 MG tablet Take 1 tablet (200 mg total) by mouth daily. For 3 days then decrease the dose to 1 tablet ($RemoveB'200mg'[id number]$ ) by mouth twice daily. 90 tablet 1   apixaban (ELIQUIS) 5 MG TABS tablet Take 1 tablet (5 mg total) by mouth 2 (two) times daily. 60 tablet 2   aspirin EC 81 MG tablet Take 162 mg by mouth in the morning. Swallow whole.     ezetimibe (ZETIA) 10 MG tablet Take 1 tablet (10 mg total) by mouth daily. 90 tablet 3   furosemide (LASIX) 20 MG tablet Take 1 tablet (20 mg total) by mouth daily. (Patient taking differently: Take 20 mg by mouth as needed. Take 1 Tablet As Needed for swelling and weight gain 3 lbs overnight and 5 lbs. In a Week.) 90 tablet 1   losartan (COZAAR) 25 MG tablet Take 1 tablet (25 mg total) by mouth daily. 30 tablet 11   metoprolol tartrate (LOPRESSOR) 25 MG tablet Take 1 tablet (25 mg total) by mouth 2 (two) times daily. 180 tablet 3   nitroGLYCERIN (NITROSTAT) 0.4 MG SL tablet PLACE 1 TABLET UNDER THE TONGUE  EVERY  5  MINUTES  AS  NEEDED  FOR  CHEST  PAIN 25 tablet 0   pantoprazole (PROTONIX) 40 MG tablet Take 1 tablet by mouth once daily 90 tablet 3   polyethylene glycol (MIRALAX / GLYCOLAX) 17 g packet Take 17 g by mouth daily as  needed for mild constipation. 14 each 0   REPATHA PUSHTRONEX SYSTEM 420 MG/3.5ML SOCT Inject 420 mg into the skin every 30 (thirty) days.     tamsulosin (FLOMAX) 0.4 MG CAPS capsule Take 0.4 mg by mouth.     docusate sodium (COLACE) 100 MG capsule Take 1 capsule (100 mg total) by mouth 2 (two) times daily. (Patient not taking: Reported on 12/22/2021) 10 capsule 0   No current facility-administered medications for this visit.     Review of Systems  He denies chest pain, palpitations, dyspnea, pnd, orthopnea, n, v, dizziness, syncope, edema, weight gain, or early satiety. All other systems reviewed and are otherwise negative except as noted above.   Physical Exam    VS:  BP 138/70 (BP Location: Left Arm, Patient Position: Sitting, Cuff Size: Normal)    Pulse (!) 45    Ht 5' 7.5" (1.715 m)    Wt 165 lb (74.8 kg)    BMI 25.46 kg/m    GEN: Well nourished, well developed, in no acute distress. HEENT: normal. Neck: Supple, no JVD, carotid bruits, or masses. Cardiac: RRR, 2/6  murmur, no rubs, or gallops. No clubbing, cyanosis, edema.  Radials/DP/PT 2+ and equal bilaterally.  Sternal incision clean, dry, and intact with well approximated edges. Respiratory:  Respirations regular and unlabored, clear to auscultation bilaterally. GI: Soft, nontender, nondistended, BS + x 4. MS: no deformity or atrophy. Skin: warm and dry, no rash. Neuro:  Strength and sensation are intact. Psych: Normal affect.  Accessory Clinical Findings    ECG personally reviewed by me today - Sinus bradycardia, 45 bpm, LBBB - no acute changes.  Lab Results  Component Value Date   WBC 7.2 11/02/2021   HGB 10.7 (L) 11/02/2021   HCT 34.1 (L) 11/02/2021   MCV 92.2 11/02/2021   PLT 214 11/02/2021   Lab Results  Component Value Date   CREATININE 1.82 (H) 11/10/2021   BUN 21 11/10/2021   NA 140 11/10/2021   K 4.8 11/10/2021   CL 103 11/10/2021   CO2 21 11/10/2021   Lab Results  Component Value Date   ALT 6  11/01/2021   AST 14 (L) 11/01/2021   ALKPHOS 106 11/01/2021   BILITOT 0.5 11/01/2021   Lab Results  Component Value Date   CHOL 86 11/02/2021   HDL 25 (L) 11/02/2021   LDLCALC 30 11/02/2021   LDLDIRECT 97 02/03/2018   TRIG 157 (H) 11/02/2021   CHOLHDL 3.4 11/02/2021    Lab Results  Component Value Date   HGBA1C 5.7 (H) 10/27/2021    Assessment & Plan    1. CAD s/p CABG x3: H/o of CAD s/p DES-RCA in 2019.  Now s/p CABG x 3 (LIMA-LAD, SVG-OM, PDA) on 10/27/2022. Stable with no anginal symptoms. No indication for ischemic evaluation. Continue aspirin, Eliquis, losartan, metoprolol as below, Zetia, Repatha.  2.  Generalized fatigue/sinus bradycardia: EKG shows heart rate 45 bpm, LBBB.  He does report generalized fatigue and occasional lightheadedness. We will decrease metoprolol to 25 mg twice daily.  Monitor heart rate and blood pressure at home and report BP consistently >130/80 or SBP <110 and HR consistently <55.  3. PAF: Postop atrial fibrillation in the setting of recent CABG x3.  Amiodarone recently decreased in the setting of nausea, fatigue.  Symptoms have improved on reduced dosage though he is bradycardic today. He has had mild nosebleeds on Eliquis.  Discussed humidification, saline spray as needed. Continue Eliquis, amiodarone, metoprolol as above.  4. PAD/RAS: Renal ultrasound 12/07/21 showed no change from prior study, repeat bilateral ABIs/TBIs increased compared to prior study, repeat LE arterial duplex unchanged from prior study with recommended repeat studies in 1 year per Dr. Gwenlyn Found.  Continue Aspirin, Zetia, and Repatha as above.  5. Bilateral carotid artery disease: Recent carotid Doppler showed patent right carotid endarterectomy and stent.  Recommended repeat study in 12 months.  6. Bilateral lower extremity edema/grade 2 diastolic dysfunction: Euvolemic and well compensated on exam.  He states he feels "dried out."  We will check BMET today.  Will decrease Lasix to  as needed use for LE edema, weight gain of 2 to 3 pounds overnight, 5 pounds in 1 week.  Otherwise, continue metoprolol as above, losartan.   7. AS: Moderate by 2D echo 10/29/2021. He did not undergo aortic valve replacement time of bypass surgery.  He does have a murmur on exam. Currently asymptomatic.  Recommendations for monitoring with annual echocardiogram.   8. Hypertension: BP well controlled. Continue current antihypertensive regimen with changes as above.  If blood pressure becomes elevated with reduce dose of metoprolol, could consider increasing losartan.  9. Hyperlipidemia: LDL was 30 on 11/02/2021.  Continue aspirin, Repatha, Zetia.  10. Disposition: Follow-up with Dr. Alvester Chou as scheduled in March 2023.  Lenna Sciara, NP 12/22/2021, 11:33 AM

## 2021-12-22 ENCOUNTER — Other Ambulatory Visit: Payer: Self-pay

## 2021-12-22 ENCOUNTER — Ambulatory Visit: Payer: Medicare Other | Admitting: Nurse Practitioner

## 2021-12-22 ENCOUNTER — Encounter: Payer: Self-pay | Admitting: Nurse Practitioner

## 2021-12-22 VITALS — BP 138/70 | HR 45 | Ht 67.5 in | Wt 165.0 lb

## 2021-12-22 DIAGNOSIS — I5189 Other ill-defined heart diseases: Secondary | ICD-10-CM

## 2021-12-22 DIAGNOSIS — I350 Nonrheumatic aortic (valve) stenosis: Secondary | ICD-10-CM

## 2021-12-22 DIAGNOSIS — I2510 Atherosclerotic heart disease of native coronary artery without angina pectoris: Secondary | ICD-10-CM

## 2021-12-22 DIAGNOSIS — I480 Paroxysmal atrial fibrillation: Secondary | ICD-10-CM | POA: Diagnosis not present

## 2021-12-22 DIAGNOSIS — R001 Bradycardia, unspecified: Secondary | ICD-10-CM

## 2021-12-22 DIAGNOSIS — E785 Hyperlipidemia, unspecified: Secondary | ICD-10-CM | POA: Diagnosis not present

## 2021-12-22 DIAGNOSIS — I6523 Occlusion and stenosis of bilateral carotid arteries: Secondary | ICD-10-CM | POA: Diagnosis not present

## 2021-12-22 DIAGNOSIS — Z9861 Coronary angioplasty status: Secondary | ICD-10-CM

## 2021-12-22 DIAGNOSIS — R5383 Other fatigue: Secondary | ICD-10-CM | POA: Diagnosis not present

## 2021-12-22 DIAGNOSIS — I739 Peripheral vascular disease, unspecified: Secondary | ICD-10-CM

## 2021-12-22 DIAGNOSIS — Z9889 Other specified postprocedural states: Secondary | ICD-10-CM | POA: Diagnosis not present

## 2021-12-22 DIAGNOSIS — I10 Essential (primary) hypertension: Secondary | ICD-10-CM

## 2021-12-22 DIAGNOSIS — Z951 Presence of aortocoronary bypass graft: Secondary | ICD-10-CM

## 2021-12-22 MED ORDER — METOPROLOL TARTRATE 25 MG PO TABS: 25.0000 mg | ORAL_TABLET | Freq: Two times a day (BID) | ORAL | 3 refills | Status: DC

## 2021-12-22 NOTE — Patient Instructions (Signed)
Medication Instructions:  Decrease Metoprolol Tartrate to 25 mg ( 1 Tablet Twice Daily). Lasix 20 mg ( 1 Tablet As Needed for Swelling and weight gain 3 lbs. Overnight and 5 lbs. In a Week). *If you need a refill on your cardiac medications before your next appointment, please call your pharmacy*   Lab Work: BMET, CBC : Today If you have labs (blood work) drawn today and your tests are completely normal, you will receive your results only by: [place] (if you have MyChart) OR A paper copy in the mail If you have any lab test that is abnormal or we need to change your treatment, we will call you to review the results.   Testing/Procedures: No Testing   Follow-Up: At [facility], you and your health needs are our priority.  As part of our continuing mission to provide you with exceptional heart care, we have created designated Provider Care Teams.  These Care Teams include your primary Cardiologist (physician) and Advanced Practice Providers (APPs -  Physician Assistants and Nurse Practitioners) who all work together to provide you with the care you need, when you need it.  We recommend signing up for the patient portal called "MyChart".  Sign up information is provided on this After Visit Summary.  MyChart is used to connect with patients for Virtual Visits (Telemedicine).  Patients are able to view lab/test results, encounter notes, upcoming appointments, etc.  Non-urgent messages can be sent to your provider as well.   To learn more about what you can do with MyChart, go to [email or web address].    Your next appointment:   February 09, 2022 1:00 PM  The format for your next appointment:   In Person  Provider:   [name], MD     Other Instructions Log Blood Pressure and Heart Rate Daily. For Blood Pressure consistently over 094/70 and systolic Below 962 and Heart Rate below 55 . Please Call Office.

## 2021-12-23 LAB — BASIC METABOLIC PANEL
BUN/Creatinine Ratio: 13 (ref 10–24)
BUN: 22 mg/dL (ref 8–27)
CO2: 25 mmol/L (ref 20–29)
Calcium: 10.2 mg/dL (ref 8.6–10.2)
Chloride: 103 mmol/L (ref 96–106)
Creatinine, Ser: 1.75 mg/dL — ABNORMAL HIGH (ref 0.76–1.27)
Glucose: 79 mg/dL (ref 70–99)
Potassium: 4.5 mmol/L (ref 3.5–5.2)
Sodium: 145 mmol/L — ABNORMAL HIGH (ref 134–144)
eGFR: 40 mL/min/{1.73_m2} — ABNORMAL LOW (ref >59)

## 2021-12-23 LAB — CBC
Hematocrit: 41.8 % (ref 37.5–51.0)
Hemoglobin: 13.5 g/dL (ref 13.0–17.7)
MCH: 29.2 pg (ref 26.6–33.0)
MCHC: 32.3 g/dL (ref 31.5–35.7)
MCV: 91 fL (ref 79–97)
Platelets: 257 10*3/uL (ref 150–450)
RBC: 4.62 x10E6/uL (ref 4.14–5.80)
RDW: 14.7 % (ref 11.6–15.4)
WBC: 9.1 10*3/uL (ref 3.4–10.8)

## 2021-12-25 ENCOUNTER — Encounter: Payer: Self-pay | Admitting: *Deleted

## 2021-12-26 ENCOUNTER — Other Ambulatory Visit (HOSPITAL_COMMUNITY): Payer: Self-pay | Admitting: Cardiovascular Disease

## 2021-12-26 DIAGNOSIS — I739 Peripheral vascular disease, unspecified: Secondary | ICD-10-CM

## 2021-12-26 DIAGNOSIS — I701 Atherosclerosis of renal artery: Secondary | ICD-10-CM

## 2021-12-29 ENCOUNTER — Encounter (HOSPITAL_COMMUNITY): Payer: Self-pay | Admitting: Emergency Medicine

## 2021-12-29 ENCOUNTER — Telehealth: Payer: Self-pay | Admitting: Cardiovascular Disease

## 2021-12-29 ENCOUNTER — Emergency Department (HOSPITAL_COMMUNITY)
Admission: EM | Admit: 2021-12-29 | Discharge: 2021-12-29 | Disposition: A | Discharge status: Home / Self Care | Payer: Medicare Other | Attending: Emergency Medicine | Admitting: Emergency Medicine

## 2021-12-29 ENCOUNTER — Emergency Department (HOSPITAL_COMMUNITY): Payer: Medicare Other

## 2021-12-29 DIAGNOSIS — I10 Essential (primary) hypertension: Secondary | ICD-10-CM | POA: Diagnosis not present

## 2021-12-29 DIAGNOSIS — R0602 Shortness of breath: Secondary | ICD-10-CM | POA: Diagnosis not present

## 2021-12-29 DIAGNOSIS — Z7982 Long term (current) use of aspirin: Secondary | ICD-10-CM | POA: Insufficient documentation

## 2021-12-29 DIAGNOSIS — Z79899 Other long term (current) drug therapy: Secondary | ICD-10-CM | POA: Diagnosis not present

## 2021-12-29 DIAGNOSIS — I129 Hypertensive chronic kidney disease with stage 1 through stage 4 chronic kidney disease, or unspecified chronic kidney disease: Secondary | ICD-10-CM | POA: Insufficient documentation

## 2021-12-29 DIAGNOSIS — Z7901 Long term (current) use of anticoagulants: Secondary | ICD-10-CM | POA: Insufficient documentation

## 2021-12-29 DIAGNOSIS — I2510 Atherosclerotic heart disease of native coronary artery without angina pectoris: Secondary | ICD-10-CM | POA: Diagnosis not present

## 2021-12-29 DIAGNOSIS — R001 Bradycardia, unspecified: Secondary | ICD-10-CM | POA: Diagnosis not present

## 2021-12-29 DIAGNOSIS — N189 Chronic kidney disease, unspecified: Secondary | ICD-10-CM | POA: Diagnosis not present

## 2021-12-29 DIAGNOSIS — R42 Dizziness and giddiness: Secondary | ICD-10-CM | POA: Diagnosis not present

## 2021-12-29 LAB — CBC
HCT: 43.6 % (ref 39.0–52.0)
Hemoglobin: 13.9 g/dL (ref 13.0–17.0)
MCH: 29.8 pg (ref 26.0–34.0)
MCHC: 31.9 g/dL (ref 30.0–36.0)
MCV: 93.6 fL (ref 80.0–100.0)
Platelets: 213 10*3/uL (ref 150–400)
RBC: 4.66 MIL/uL (ref 4.22–5.81)
RDW: 14.6 % (ref 11.5–15.5)
WBC: 8 10*3/uL (ref 4.0–10.5)
nRBC: 0 % (ref 0.0–0.2)

## 2021-12-29 LAB — BASIC METABOLIC PANEL
Anion gap: 10 (ref 5–15)
BUN: 24 mg/dL — ABNORMAL HIGH (ref 8–23)
CO2: 27 mmol/L (ref 22–32)
Calcium: 9.9 mg/dL (ref 8.9–10.3)
Chloride: 102 mmol/L (ref 98–111)
Creatinine, Ser: 1.99 mg/dL — ABNORMAL HIGH (ref 0.61–1.24)
GFR, Estimated: 34 mL/min — ABNORMAL LOW (ref >60)
Glucose, Bld: 106 mg/dL — ABNORMAL HIGH (ref 70–99)
Potassium: 4.8 mmol/L (ref 3.5–5.1)
Sodium: 139 mmol/L (ref 135–145)

## 2021-12-29 LAB — TROPONIN I (HIGH SENSITIVITY)
Troponin I (High Sensitivity): 22 ng/L — ABNORMAL HIGH (ref <18)
Troponin I (High Sensitivity): 22 ng/L — ABNORMAL HIGH (ref <18)

## 2021-12-29 IMAGING — DX DG CHEST 2V
2 series · 2 of 2 positions shown · non-contrast
Comparison: [DATE]

CLINICAL DATA: Shortness of breath and dizziness.

EXAM:
CHEST - 2 VIEW

[w chest pa]
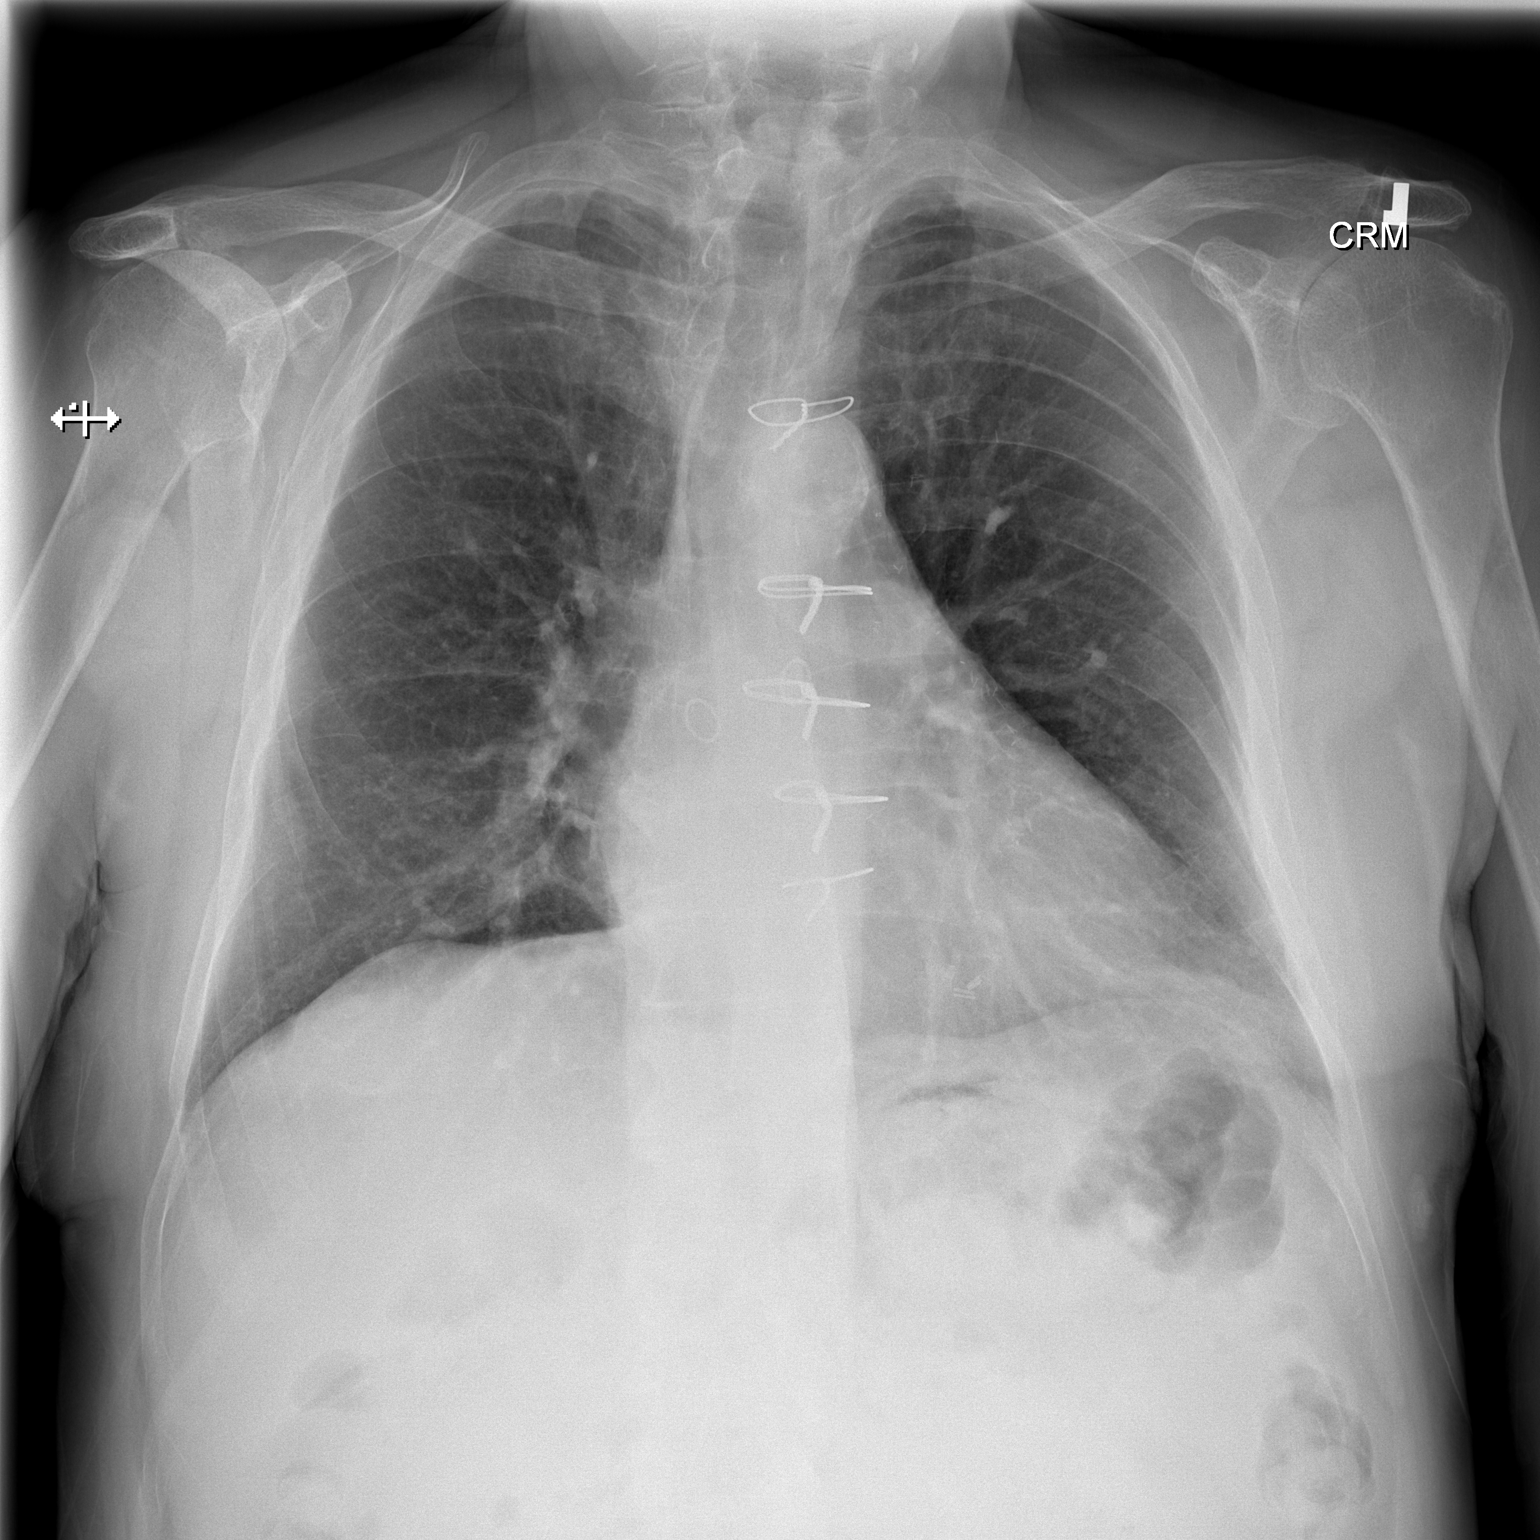

[w chest lat]
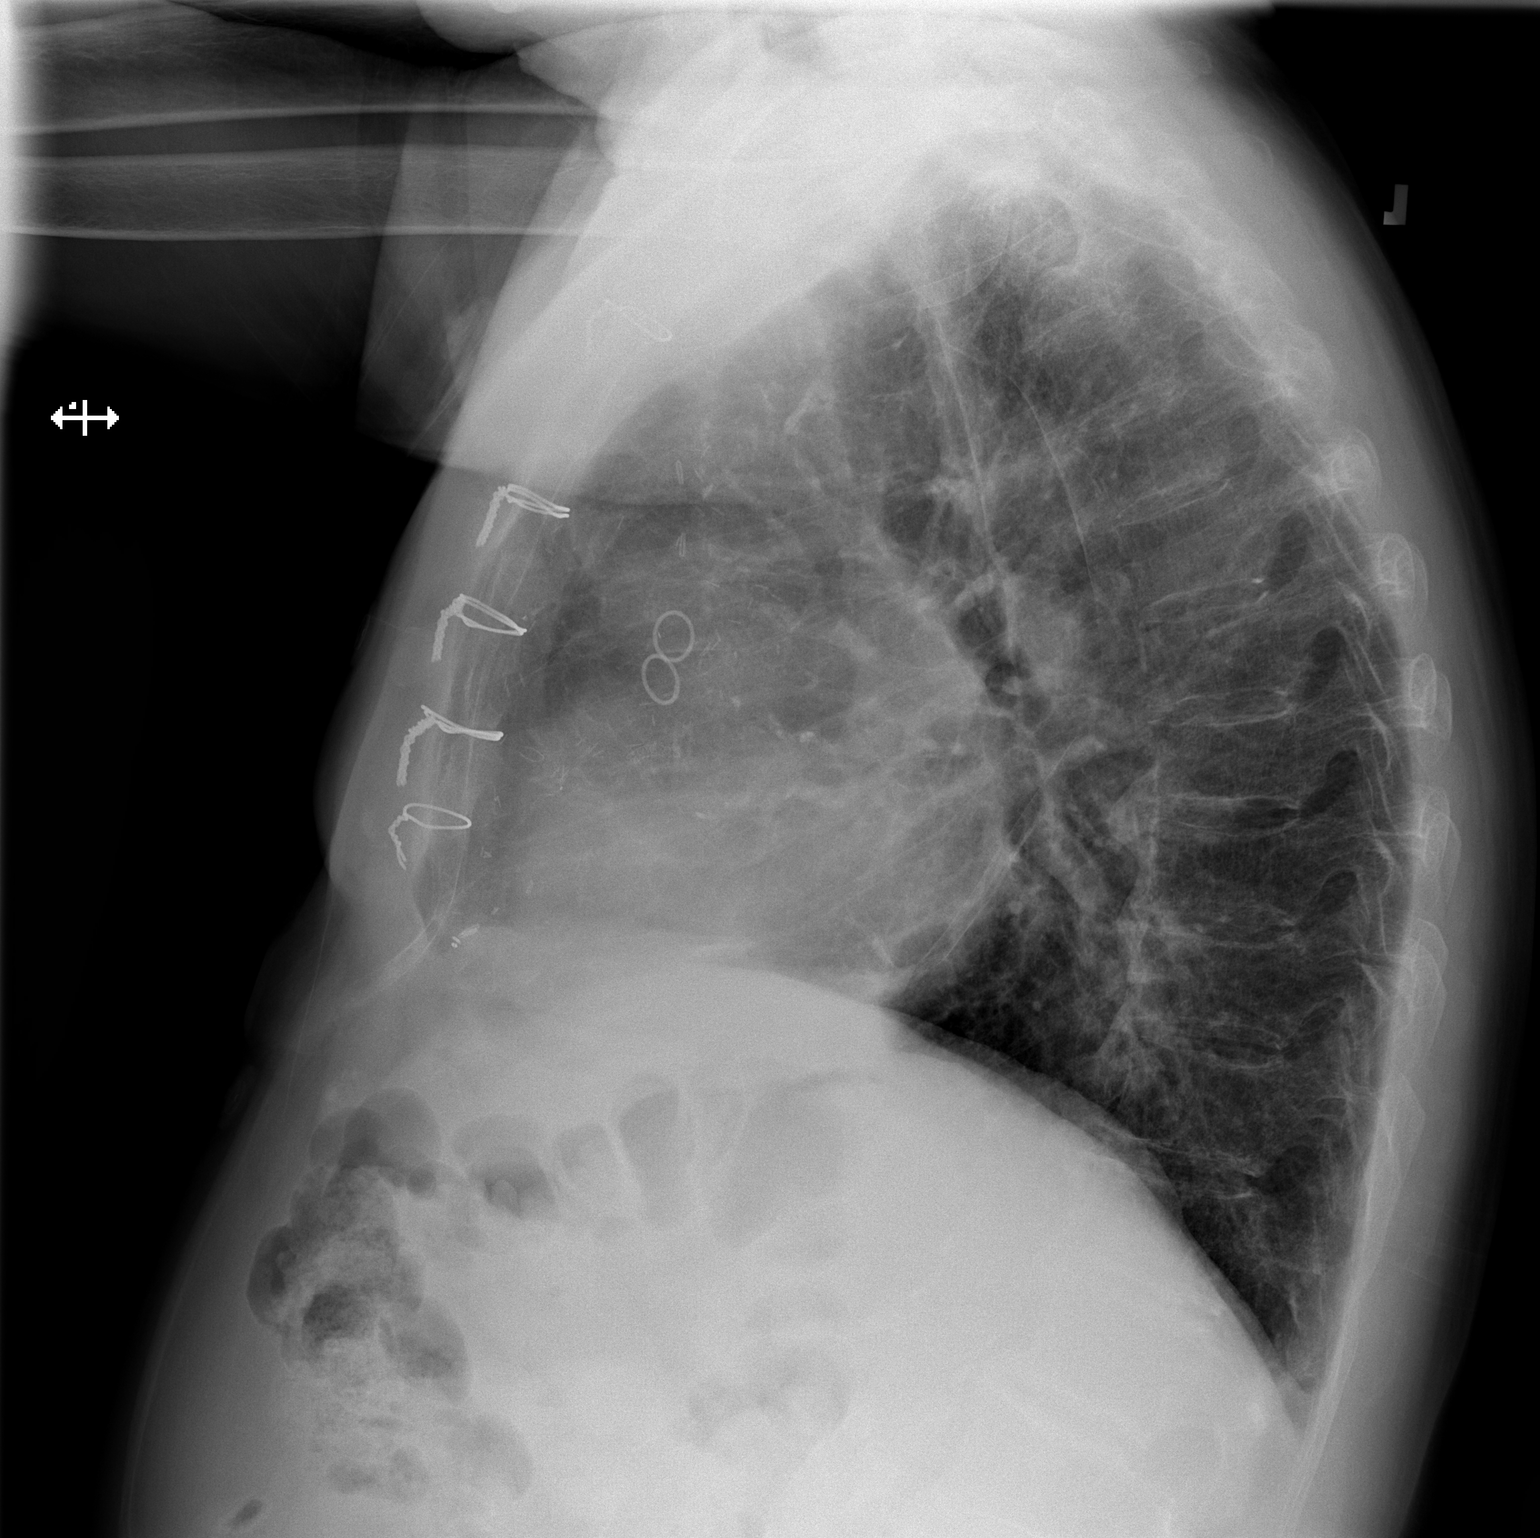

[2 of 2 positions shown; findings below may reference images not displayed]

FINDINGS: Multiple sternal wires and vascular clips are noted. The heart size
and mediastinal contours are within normal limits. Both lungs are
clear. The visualized skeletal structures are unremarkable.
IMPRESSION: 1. Evidence of prior median sternotomy/CABG.
2. No acute cardiopulmonary disease.

## 2021-12-29 MED ORDER — LOSARTAN POTASSIUM 50 MG PO TABS
25.0000 mg | ORAL_TABLET | Freq: Once | ORAL | Status: AC
  Administered 2021-12-29: 25 mg via ORAL
  Filled 2021-12-29: qty 1

## 2021-12-29 NOTE — ED Provider Notes (Signed)
[facility] EMERGENCY DEPARTMENT Provider Note   CSN: [id number] Arrival date & time: 12/29/21  1752     History  Chief Complaint  Patient presents with   Shortness of Breath   Dizziness    MARKEVIOUS EHMKE is a 76 y.o. male.  This is a 76 year old male with past medical history of coronary artery disease, gastric reflux, peripheral arterial disease, hyponatremia, CKD, hypokalemia, hypertension, and hyperlipidemia who presents to emergency department for evaluation of blood pressure issues.  To both the patient and the patient's daughter who I spoke with on the telephone they report that the patient has been very hypertensive at home recently.  They state that he was incorrectly taking his home antihypertensive regimen.  They state that the patient had systolic blood pressure readings greater than 205.  For this their cardiologist had recommended that they come to the emergency department for evaluation.  The patient's daughter reports that he becomes severely agitated when attempting to take his blood pressure which she thinks tribes of his blood pressure falsely.  The patient agrees with this stating that he hates the blood pressure machine and thinks that he should have to take it as frequently.  The patient denies that he is having shortness of breath despite this being mentioned in the triage note.  He states that he has had no shortness of breath, chest pain, lower extremity swelling, fever, chills, or cough.  The patient does report that he has had dizziness which has been present over the past month.  He states that he is only dizzy after standing.  This dizziness then resolves.     Home Medications Prior to Admission medications   Medication Sig Start Date End Date Taking? Authorizing Provider  acetaminophen (TYLENOL) 500 MG tablet Take 500-1,000 mg by mouth every 6 (six) hours as needed (for pain).    [provider]  amiodarone (PACERONE) 200 MG  tablet Take 1 tablet (200 mg total) by mouth daily. For 3 days then decrease the dose to 1 tablet ($RemoveB'200mg'ymAWQeze$ ) by mouth twice daily. Patient taking differently: Take 200 mg by mouth daily. 11/10/21   Lorretta Harp, MD  apixaban (ELIQUIS) 5 MG TABS tablet Take 1 tablet (5 mg total) by mouth 2 (two) times daily. 11/03/21   Antony Odea, PA-C  aspirin EC 81 MG tablet Take 81 mg by mouth in the morning. Swallow whole.    [provider]  ezetimibe (ZETIA) 10 MG tablet Take 1 tablet (10 mg total) by mouth daily. 11/10/21   Lorretta Harp, MD  furosemide (LASIX) 20 MG tablet Take 1 tablet (20 mg total) by mouth daily. Patient taking differently: Take 20 mg by mouth daily as needed for edema. Take 1 Tablet As Needed for swelling and weight gain 3 lbs overnight and 5 lbs. In a Week. 11/10/21 02/08/22  Lorretta Harp, MD  losartan (COZAAR) 25 MG tablet Take 1 tablet (25 mg total) by mouth daily. 11/03/21 11/03/22  [name], MD  metoprolol tartrate (LOPRESSOR) 25 MG tablet Take 1 tablet (25 mg total) by mouth 2 (two) times daily. 12/22/21 03/22/22  Lendon Colonel, NP  nitroGLYCERIN (NITROSTAT) 0.4 MG SL tablet PLACE 1 TABLET UNDER THE TONGUE  EVERY  5  MINUTES  AS  NEEDED  FOR  CHEST  PAIN 10/24/21   Lorretta Harp, MD  pantoprazole (PROTONIX) 40 MG tablet Take 1 tablet by mouth once daily 09/13/21   Lorretta Harp, MD  polyethylene glycol (MIRALAX / GLYCOLAX) 17 g packet Take 17 g by mouth daily as needed for mild constipation. 08/30/21   Irving Copas, PA-C  REPATHA PUSHTRONEX SYSTEM 420 MG/3.5ML SOCT Inject 420 mg into the skin every 30 (thirty) days. 10/02/21   [provider]      Allergies    Statins    Review of Systems   Review of Systems  Respiratory:  Negative for shortness of breath.   Neurological:  Positive for dizziness.  All other systems reviewed and are negative.  Physical Exam Updated Vital Signs BP (!) 176/85    Pulse (!) 50    Temp  98.8 F (37.1 C)    Resp 14    Ht 5' 7.5" (1.715 m)    Wt 73 kg    SpO2 96%    BMI 24.84 kg/m  Physical Exam Vitals and nursing note reviewed.  Constitutional:      General: He is not in acute distress.    Appearance: He is well-developed. He is not ill-appearing.  HENT:     Head: Normocephalic and atraumatic.  Eyes:     Conjunctiva/sclera: Conjunctivae normal.  Cardiovascular:     Rate and Rhythm: Normal rate and regular rhythm.     Heart sounds: No murmur heard. Pulmonary:     Effort: Pulmonary effort is normal. No respiratory distress.     Breath sounds: Normal breath sounds.  Chest:     Chest wall: No tenderness.  Abdominal:     Palpations: Abdomen is soft.     Tenderness: There is no abdominal tenderness.  Musculoskeletal:        General: No swelling.     Cervical back: Neck supple.     Right lower leg: No edema.     Left lower leg: No edema.  Skin:    General: Skin is warm and dry.     Capillary Refill: Capillary refill takes less than 2 seconds.  Neurological:     General: No focal deficit present.     Mental Status: He is alert and oriented to person, place, and time. Mental status is at baseline.     Cranial Nerves: No cranial nerve deficit.     Sensory: No sensory deficit.     Motor: No weakness.     Gait: Gait normal.  Psychiatric:        Mood and Affect: Mood normal.    ED Results / Procedures / Treatments   Labs (all labs ordered are listed, but only abnormal results are displayed) Labs Reviewed  BASIC METABOLIC PANEL - Abnormal; Notable for the following components:      Result Value   Glucose, Bld 106 (*)    BUN 24 (*)    Creatinine, Ser 1.99 (*)    GFR, Estimated 34 (*)    All other components within normal limits  TROPONIN I (HIGH SENSITIVITY) - Abnormal; Notable for the following components:   Troponin I (High Sensitivity) 22 (*)    All other components within normal limits  TROPONIN I (HIGH SENSITIVITY) - Abnormal; Notable for the following  components:   Troponin I (High Sensitivity) 22 (*)    All other components within normal limits  CBC    EKG EKG Interpretation  Date/Time:  Friday December 29 2021 18:02:50 EST Ventricular Rate:  48 PR Interval:  188 QRS Duration: 136 QT Interval:  496 QTC Calculation: 443 R Axis:   101 Text Interpretation: Sinus bradycardia Rightward axis Left ventricular hypertrophy  with QRS widening and repolarization abnormality ( Cornell product ) Abnormal ECG  ST/T changes similar to priors Confirmed by Sherwood Gambler [id number]) on 12/29/2021 9:18:31 PM  Radiology DG Chest 2 View  Result Date: 12/29/2021 CLINICAL DATA:  Shortness of breath and dizziness. EXAM: CHEST - 2 VIEW COMPARISON:  November 28, 2021 FINDINGS: Multiple sternal wires and vascular clips are noted. The heart size and mediastinal contours are within normal limits. Both lungs are clear. The visualized skeletal structures are unremarkable. IMPRESSION: 1. Evidence of prior median sternotomy/CABG. 2. No acute cardiopulmonary disease. Electronically Signed   By: Virgina Norfolk M.D.   On: 12/29/2021 19:30    Procedures Procedures    Medications Ordered in ED Medications  losartan (COZAAR) tablet 25 mg (25 mg Oral Given 12/29/21 2321)    ED Course/ Medical Decision Making/ A&P                           Medical Decision Making Amount and/or Complexity of Data Reviewed Labs: ordered. Radiology: ordered.  Risk Prescription drug management.   This is a 76 year old male presents for management of a acute on chronic issue.  Patient is having difficulty controlling his blood pressures at home.  I am unsure whether the patient is actually having difficulty controlling his blood pressures as he has been inconsistent taking his antihypertensive regimen and becomes agitated when attempting to take his blood pressure.  While talking to the patient in the emergency department we were able to discuss matters not related to medicine  and I repeated his blood pressure which revealed that he had systolic blood pressures less than 175.  I recommended to the patient that he could double up his losartan but also recommended to the patient that he should follow-up with his cardiologist for further management of his blood pressure.  Lab work obtained in triage did not reveal leukocytosis or anemia.  The patient does have an elevated creatinine which is known.  I did recommend to the patient that he follow-up with his PCP for repeat creatinine testing and in the meantime he should stay well-hydrated.  The patient did have a troponin obtained in triage that was elevated to 22 however due to the fact the patient has had no chest pain or shortness of breath I will not continue to trend that.  EKG was obtained which revealed that he was bradycardic but did not reveal acute ischemic changes.  Chest x-ray reviewed independently did not reveal acute cardiopulmonary process.  I discussed the patient's emergency department visit with his daughter who agreed that she could manage his blood pressure at home and that they would follow-up with his cardiologist for further blood pressure management.  Return precautions were provided to both the daughter and to the patient which they voiced understanding of.   Final Clinical Impression(s) / ED Diagnoses Final diagnoses:  Hypertension, unspecified type    Rx / DC Orders ED Discharge Orders     None         Zachery Dakins, MD 12/30/21 [facility]    Sherwood Gambler, MD 01/01/22 [phone]

## 2021-12-29 NOTE — Discharge Instructions (Addendum)
Please follow up with your cardiologist for further blood pressure management.

## 2021-12-29 NOTE — Telephone Encounter (Signed)
Called daughter back and states that she states that pt's BP is 205/95 and she is concerned. She states that pt was here for his appointment recently and told pt told  her that he was to stop metoprolol and lasix. Explained  to daughter the new directions for metoprolol, decrease metoprolol to 25 mg twice daily and the lasix:  Lasix 20 mg ( 1 Tablet As Needed for Swelling and weight gain 3 lbs. Overnight and 5 lbs. In a Week).  Pt states that he did take his metoprolol this morning.she will try to make sure pt weighs himself daily to track his weight and dosage for the Lasix.   BP is 205/95 and informed daughter to take pt to the ER to be evaluated and possibly some IV's to get his BP down.Verbalized understanding. Daughter states that she has a special needs Child and is unable to take right now. She will call sitter then take pt to the ER, this should be about 6-6:30pm. She will have pt rest and elevate and be inactive  until she is able to take him.

## 2021-12-29 NOTE — Telephone Encounter (Signed)
Pt c/o BP issue: STAT if pt c/o blurred vision, one-sided weakness or slurred speech  1. What are your last 5 BP readings? 205/195  2. Are you having any other symptoms (ex. Dizziness, headache, blurred vision, passed out)?   3. What is your BP issue?

## 2021-12-29 NOTE — ED Notes (Signed)
Patient verbalizes understanding of discharge instructions. Opportunity for questioning and answers were provided. Armband removed by staff, pt discharged from ED ambulatory.   

## 2021-12-29 NOTE — Telephone Encounter (Signed)
Called patient to review medications with him and he informed me that his friend was on the phone with someone from our office.   Per Office note from 12/22/21 per Raquel Sarna monge, decrease metoprolol to 25 mg twice daily.  Monitor heart rate and blood pressure at home and report BP consistently >130/80 or SBP <110 and HR consistently <55.

## 2021-12-29 NOTE — ED Triage Notes (Signed)
Pt endorses intermittent SOB, dizziness and HTN for a week. Reports he had open heart surgery last month. States he has not missed any medications. Denies CP.

## 2021-12-29 NOTE — Telephone Encounter (Signed)
Pt c/o medication issue:  1. Name of Medication:  metoprolol tartrate (LOPRESSOR) 25 MG tablet  losartan (COZAAR) 25 MG tablet   2. How are you currently taking this medication (dosage and times per day)?  Patient states he has been taking both medications once daily by mouth  3. Are you having a reaction (difficulty breathing--STAT)?  No   4. What is your medication issue?   Patient states he has been taking both medications once daily by mouth and he would like to confirm whether he has been taking them correctly. Please advise.

## 2021-12-29 NOTE — ED Provider Triage Note (Signed)
Emergency Medicine Provider Triage Evaluation Note  LEOPOLD SMYERS , a 76 y.o. male  was evaluated in triage.  Pt complains of shortness of breath,  history of heart surgery  Review of Systems  Positive: Decreased urination Negative: fever  Physical Exam  BP (!) 186/91 (BP Location: Right Arm)    Pulse (!) 50    Temp 97.6 F (36.4 C) (Oral)    Resp 16    Ht 5' 7.5" (1.715 m)    Wt 73 kg    SpO2 100%    BMI 24.84 kg/m  Gen:   Awake, no distress   Resp:  Normal effort  MSK:   Moves extremities without difficulty  Other:    Medical Decision Making  Medically screening exam initiated at 6:45 PM.  Appropriate orders placed.  CRIXUS MCAULAY was informed that the remainder of the evaluation will be completed by another provider, this initial triage assessment does not replace that evaluation, and the importance of remaining in the ED until their evaluation is complete.     Fransico Meadow, [place] 12/29/21 1845

## 2022-01-02 DIAGNOSIS — D3132 Benign neoplasm of left choroid: Secondary | ICD-10-CM | POA: Diagnosis not present

## 2022-01-03 ENCOUNTER — Telehealth (HOSPITAL_COMMUNITY): Payer: Self-pay | Admitting: *Deleted

## 2022-01-03 NOTE — Telephone Encounter (Signed)
Spoke to Joshua Wyatt. Completed health history. Cofirmed appointment for tomorrow.Barnet Pall, RN,BSN 01/03/2022 3:46 PM

## 2022-01-04 ENCOUNTER — Telehealth: Payer: Self-pay | Admitting: Cardiology

## 2022-01-04 ENCOUNTER — Encounter (HOSPITAL_COMMUNITY)
Admission: RE | Admit: 2022-01-04 | Discharge: 2022-01-04 | Disposition: A | Discharge status: Home / Self Care | Payer: Medicare Other | Source: Ambulatory Visit | Attending: Cardiovascular Disease | Admitting: Cardiovascular Disease

## 2022-01-04 ENCOUNTER — Other Ambulatory Visit: Payer: Self-pay

## 2022-01-04 ENCOUNTER — Encounter (HOSPITAL_COMMUNITY): Payer: Self-pay

## 2022-01-04 VITALS — BP 142/62 | HR 56 | Ht 66.0 in | Wt 166.2 lb

## 2022-01-04 DIAGNOSIS — Z951 Presence of aortocoronary bypass graft: Secondary | ICD-10-CM | POA: Insufficient documentation

## 2022-01-04 NOTE — Progress Notes (Signed)
Joshua Wyatt presented to the ED on 12/29/21 with hypertension. Blood pressure today 142/62 heart rate 55 Sinus brad, bundle branch block. Oxygen saturation 95% on room air. Recheck BP 152/80. Joshua Kicks NP paged and notified about ED visit and vital signs this morning. Joshua Wyatt said that Joshua Wyatt is okay to proceed with exercise. Will notify Joshua Wyatt if Joshua Wyatt blood pressures are elevated during his 6 minute walk test.Joshua Raper Venetia Maxon, RN,BSN 01/04/2022 11:29 AM

## 2022-01-04 NOTE — Telephone Encounter (Signed)
Cardiac rehab called concerning BP - pt has been seen in ER 12/29/21 for dizziness and HTN.  BP there 017 systolic and HR 48.  SB he is on BB and amiodarone for post op atrial fib.  He was supposed to be seen in our office but no appt was made.   He is beginning cardiac rehab and today BP 152/80 and P 48-56 SB.  He has no symptoms.  I cleared for cardiac rehab.  Though if BP increases they will call back.  Please arrange for pt to be seen by Dr. Gwenlyn Found, APP or pharmacy staff at office.  In next week.  Thank you.

## 2022-01-04 NOTE — Progress Notes (Signed)
Blood pressure was noted at 182/84 post 6 minute walk test. Max heart rate 62. Repeat 2 minute BP noted at 170/86 heart rate 48 nonsustained lowest heart rate. Recheck BP 168/78. Cecilie Kicks NP paged and notified about today's blood pressures. Mickel Baas said not to proceed with exercise due to continued BP. Exit BP 158/84. Joshua Wyatt notified and told he will receive a call from Dr Kennon Holter office to be seen in the office for BP management. Patient aware and agreeable to the plan. I asked Joshua Wyatt to be sure to bring his medication bottles to his appointment. Will await clearance to start exercise.Barnet Pall, RN,BSN 01/04/2022 11:57 AM

## 2022-01-04 NOTE — Telephone Encounter (Signed)
Pt's BP went up to 180s with exercise and HR only to 62.  He will hold cardiac rehab until seen in office as per first note.

## 2022-01-04 NOTE — Progress Notes (Signed)
Cardiac Individual Treatment Plan  Patient Details  Name: JEROMIAH OHALLORAN MRN: [id number] Date of Birth: 10-26-1946 Referring Provider:   Flowsheet Row CARDIAC REHAB PHASE II ORIENTATION from 01/04/2022 in [place]  Referring Provider [name], MD       Initial Encounter Date:  [place] PHASE II ORIENTATION from 01/04/2022 in [place]  Date 01/04/22       Visit Diagnosis: 10/27/21 S/P CABG x 3  Patient's Home Medications on Admission:  Current Outpatient Medications:    acetaminophen (TYLENOL) 500 MG tablet, Take 500-1,000 mg by mouth every 6 (six) hours as needed (for pain)., Disp: , Rfl:    amiodarone (PACERONE) 200 MG tablet, Take 1 tablet (200 mg total) by mouth daily. For 3 days then decrease the dose to 1 tablet ($RemoveB'200mg'aGwfeQvi$ ) by mouth twice daily. (Patient taking differently: Take 200 mg by mouth daily.), Disp: 90 tablet, Rfl: 1   apixaban (ELIQUIS) 5 MG TABS tablet, Take 1 tablet (5 mg total) by mouth 2 (two) times daily., Disp: 60 tablet, Rfl: 2   aspirin EC 81 MG tablet, Take 81 mg by mouth in the morning. Swallow whole., Disp: , Rfl:    ezetimibe (ZETIA) 10 MG tablet, Take 1 tablet (10 mg total) by mouth daily., Disp: 90 tablet, Rfl: 3   furosemide (LASIX) 20 MG tablet, Take 1 tablet (20 mg total) by mouth daily. (Patient taking differently: Take 20 mg by mouth daily as needed for edema. Take 1 Tablet As Needed for swelling and weight gain 3 lbs overnight and 5 lbs. In a Week.), Disp: 90 tablet, Rfl: 1   losartan (COZAAR) 25 MG tablet, Take 1 tablet (25 mg total) by mouth daily., Disp: 30 tablet, Rfl: 11   metoprolol tartrate (LOPRESSOR) 25 MG tablet, Take 1 tablet (25 mg total) by mouth 2 (two) times daily., Disp: 180 tablet, Rfl: 3   nitroGLYCERIN (NITROSTAT) 0.4 MG SL tablet, PLACE 1 TABLET UNDER THE TONGUE  EVERY  5  MINUTES  AS  NEEDED  FOR  CHEST  PAIN, Disp: 25 tablet, Rfl: 0    pantoprazole (PROTONIX) 40 MG tablet, Take 1 tablet by mouth once daily, Disp: 90 tablet, Rfl: 3   polyethylene glycol (MIRALAX / GLYCOLAX) 17 g packet, Take 17 g by mouth daily as needed for mild constipation., Disp: 14 each, Rfl: 0   REPATHA PUSHTRONEX SYSTEM 420 MG/3.5ML SOCT, Inject 420 mg into the skin every 30 (thirty) days., Disp: , Rfl:   Past Medical History: Past Medical History:  Diagnosis Date   Arthritis    "LITTLE IN MY LEFT HAND" (05/12/2018)   Bilateral calf pain 03/02/2013   lower ext.doppler-mild arterial insufficiency to lower ext. this is a disease in val   CAD (coronary artery disease)    a. cath 02/24/18 - 90% ost RCA; 40% ost & pro LAD   Cerebral atherosclerosis 03/02/2013   carotid duplex- normal study   Deviated septum    RIGHT   GERD (gastroesophageal reflux disease)    Heart murmur    History of echocardiogram 08/02/2005   normal study    History of kidney stones    Hyperlipemia    Hypertension 03/30/2013   PV Angiogram- rt. renal artery stent was widely open,50-60% lt. renal artery stenosis,lt. SFA stents patent with high grade above the knee  poplital stenosis   Hyponatremia 02/03/2017   Normal cardiac stress test 03/25/2013   EF 60%, normal stress  test ,this is a presurgical  test   Peripheral vascular disease ([place])    PVD (peripheral vascular disease) ([place])    A. 2009: S/P PTA/stent to D SFA. B. 01/2017: angiosculpt atherectomy/drug-eluting balloon angioplasty to L SFA.   Renal artery stenosis ([facility]) 03/02/2013   renal artery doppler-this was an abnormal doppler    Tobacco Use: Social History   Tobacco Use  Smoking Status Former   Packs/day: 1.00   Years: 58.00   Pack years: 58.00   Types: Cigars, Cigarettes   Quit date: 10/18/2013   Years since quitting: 8.2  Smokeless Tobacco Never    Labs: Recent Review Flowsheet Data     Labs for ITP Cardiac and Pulmonary Rehab Latest Ref Rng & Units 10/28/2021 10/28/2021 10/28/2021 10/30/2021  11/02/2021   Cholestrol 0 - 200 mg/dL - - - - 86   LDLCALC 0 - 99 mg/dL - - - - 30   LDLDIRECT 0 - 99 mg/dL - - - - -   HDL >40 mg/dL - - - - 25(L)   Trlycerides <150 mg/dL - - - - 157(H)   Hemoglobin A1c 4.8 - 5.6 % - - - - -   PHART 7.350 - 7.450 7.346(L) 7.361 7.349(L) - -   PCO2ART 32.0 - 48.0 mmHg 33.2 39.5 40.5 - -   HCO3 20.0 - 28.0 mmol/L 18.1(L) 22.3 22.3 - -   TCO2 22 - 32 mmol/L 19(L) 24 24 - -   ACIDBASEDEF 0.0 - 2.0 mmol/L 7.0(H) 3.0(H) 3.0(H) - -   O2SAT % 98.0 95.0 95.0 52.9 -       Capillary Blood Glucose: Lab Results  Component Value Date   GLUCAP 95 11/02/2021   GLUCAP 94 11/02/2021   GLUCAP 102 (H) 11/01/2021   GLUCAP 95 11/01/2021   GLUCAP 96 11/01/2021     Exercise Target Goals: Exercise Program Goal: Individual exercise prescription set using results from initial 6 min walk test and THRR while considering  patients activity barriers and safety.   Exercise Prescription Goal: Starting with aerobic activity 30 plus minutes a day, 3 days per week for initial exercise prescription. Provide home exercise prescription and guidelines that participant acknowledges understanding prior to discharge.  Activity Barriers & Risk Stratification:  Activity Barriers & Cardiac Risk Stratification - 01/04/22 1211       Activity Barriers & Cardiac Risk Stratification   Activity Barriers Arthritis;Left Knee Replacement;Right Knee Replacement;Joint Problems;Deconditioning;Muscular Weakness;Balance Concerns;Assistive Device    Cardiac Risk Stratification High             6 Minute Walk:  6 Minute Walk     Row Name 01/04/22 1207         6 Minute Walk   Phase Initial     Distance 696 feet     Walk Time 6 minutes     # of Rest Breaks 1  4:10 - 5:00     MPH 1.32     METS 1.35     RPE 13     Perceived Dyspnea  0     VO2 Peak 4.73     Symptoms Yes (comment)     Comments Leg fatigue requiring patient to take break at 4:10     Resting HR 56 bpm     Resting  BP 142/62     Resting Oxygen Saturation  95 %     Exercise Oxygen Saturation  during 6 min walk 95 %     Max Ex. HR 62 bpm  Max Ex. BP 182/84     2 Minute Post BP 170/86  BP @ 5 min (168/78),  BP @ 9:00 min (158/84)              Oxygen Initial Assessment:   Oxygen Re-Evaluation:   Oxygen Discharge (Final Oxygen Re-Evaluation):   Initial Exercise Prescription:  Initial Exercise Prescription - 01/04/22 1200       Date of Initial Exercise RX and Referring Provider   Date 01/04/22    Referring Provider [name], MD    Expected Discharge Date 03/02/22      NuStep   Level 1    SPM 70    Minutes 20    METs 1.5      Prescription Details   Frequency (times per week) 3    Duration Progress to 30 minutes of continuous aerobic without signs/symptoms of physical distress      Intensity   THRR 40-80% of Max Heartrate 57-114    Ratings of Perceived Exertion 11-13    Perceived Dyspnea 0-4      Progression   Progression Continue progressive overload as per policy without signs/symptoms or physical distress.      Resistance Training   Training Prescription Yes    Weight 2 lbs    Reps 10-15             Perform Capillary Blood Glucose checks as needed.  Exercise Prescription Changes:   Exercise Comments:   Exercise Goals and Review:   Exercise Goals     Row Name 01/04/22 1216             Exercise Goals   Increase Physical Activity Yes       Intervention Provide advice, education, support and counseling about physical activity/exercise needs.;Develop an individualized exercise prescription for aerobic and resistive training based on initial evaluation findings, risk stratification, comorbidities and participant's personal goals.       Expected Outcomes Short Term: Attend rehab on a regular basis to increase amount of physical activity.;Long Term: Add in home exercise to make exercise part of routine and to increase amount of physical activity.;Long  Term: Exercising regularly at least 3-5 days a week.       Increase Strength and Stamina Yes       Intervention Provide advice, education, support and counseling about physical activity/exercise needs.;Develop an individualized exercise prescription for aerobic and resistive training based on initial evaluation findings, risk stratification, comorbidities and participant's personal goals.       Expected Outcomes Short Term: Increase workloads from initial exercise prescription for resistance, speed, and METs.;Short Term: Perform resistance training exercises routinely during rehab and add in resistance training at home;Long Term: Improve cardiorespiratory fitness, muscular endurance and strength as measured by increased METs and functional capacity (6MWT)       Able to understand and use rate of perceived exertion (RPE) scale Yes       Intervention Provide education and explanation on how to use RPE scale       Expected Outcomes Short Term: Able to use RPE daily in rehab to express subjective intensity level;Long Term:  Able to use RPE to guide intensity level when exercising independently       Knowledge and understanding of Target Heart Rate Range (THRR) Yes       Intervention Provide education and explanation of THRR including how the numbers were predicted and where they are located for reference       Expected Outcomes Short Term:  Able to state/look up THRR;Short Term: Able to use daily as guideline for intensity in rehab;Long Term: Able to use THRR to govern intensity when exercising independently       Understanding of Exercise Prescription Yes       Intervention Provide education, explanation, and written materials on patient's individual exercise prescription       Expected Outcomes Short Term: Able to explain program exercise prescription;Long Term: Able to explain home exercise prescription to exercise independently                Exercise Goals Re-Evaluation :    Discharge  Exercise Prescription (Final Exercise Prescription Changes):   Nutrition:  Target Goals: Understanding of nutrition guidelines, daily intake of sodium '1500mg'$ , cholesterol '200mg'$ , calories 30% from fat and 7% or less from saturated fats, daily to have 5 or more servings of fruits and vegetables.  Biometrics:   Post Biometrics - 01/04/22 1015        Post  Biometrics   Waist Circumference 39.5 inches    Hip Circumference 39.5 inches    Waist to Hip Ratio 1 %    Triceps Skinfold 12 mm    % Body Fat 26.6 %    Grip Strength 30 kg    Flexibility 0 in    Single Leg Stand 4.68 seconds             Nutrition Therapy Plan and Nutrition Goals:   Nutrition Assessments:  MEDIFICTS Score Key: ?70 Need to make dietary changes  40-70 Heart Healthy Diet ? 40 Therapeutic Level Cholesterol Diet   Picture Your Plate Scores: <37 Unhealthy dietary pattern with much room for improvement. 41-50 Dietary pattern unlikely to meet recommendations for good health and room for improvement. 51-60 More healthful dietary pattern, with some room for improvement.  >60 Healthy dietary pattern, although there may be some specific behaviors that could be improved.    Nutrition Goals Re-Evaluation:   Nutrition Goals Discharge (Final Nutrition Goals Re-Evaluation):   Psychosocial: Target Goals: Acknowledge presence or absence of significant depression and/or stress, maximize coping skills, provide positive support system. Participant is able to verbalize types and ability to use techniques and skills needed for reducing stress and depression.  Initial Review & Psychosocial Screening:  Initial Psych Review & Screening - 01/04/22 1204       Initial Review   Current issues with Current Stress Concerns    Source of Stress Concerns Chronic Illness;Unable to perform yard/household activities;Unable to participate in former interests or hobbies    Comments Johnavon has knee surgery in September of 2022  before his open heart surgery in November      Family Dynamics   Good Support System? Yes   [place] lives alone. Dal[place] has a friend who lives near him who is like a daughter     Barriers   Psychosocial barriers to participate in program The patient should benefit from training in stress management and relaxation.      Screening Interventions   Interventions Encouraged to exercise;To provide support and resources with identified psychosocial needs    Expected Outcomes Long Term Goal: Stressors or current issues are controlled or eliminated.             Quality of Life Scores:  Quality of Life - 01/04/22 1207       Quality of Life   Select Quality of Life      Quality of Life Scores   Health/Function Pre 21.43 %  Socioeconomic Pre 23.14 %    Psych/Spiritual Pre 24.71 %    Family Pre 30 %    GLOBAL Pre 23.39 %            Scores of 19 and below usually indicate a poorer quality of life in these areas.  A difference of  2-3 points is a clinically meaningful difference.  A difference of 2-3 points in the total score of the Quality of Life Index has been associated with significant improvement in overall quality of life, self-image, physical symptoms, and general health in studies assessing change in quality of life.  PHQ-9: Recent Review Flowsheet Data     Depression screen [facility] 2/9 01/04/2022   Decreased Interest 0   Down, Depressed, Hopeless 0   PHQ - 2 Score 0      Interpretation of Total Score  Total Score Depression Severity:  1-4 = Minimal depression, 5-9 = Mild depression, 10-14 = Moderate depression, 15-19 = Moderately severe depression, 20-27 = Severe depression   Psychosocial Evaluation and Intervention:   Psychosocial Re-Evaluation:   Psychosocial Discharge (Final Psychosocial Re-Evaluation):   Vocational Rehabilitation: Provide vocational rehab assistance to qualifying candidates.   Vocational Rehab Evaluation & Intervention:  Vocational Rehab -  01/04/22 1209       Initial Vocational Rehab Evaluation & Intervention   Assessment shows need for Vocational Rehabilitation No   Amareon is retried and does not need vocational rehab at this time            Education: Education Goals: Education classes will be provided on a weekly basis, covering required topics. Participant will state understanding/return demonstration of topics presented.  Learning Barriers/Preferences:  Learning Barriers/Preferences - 01/04/22 1217       Learning Barriers/Preferences   Learning Barriers Reading    Learning Preferences Group Instruction;Individual Instruction;Skilled Demonstration             Education Topics: Hypertension, Hypertension Reduction -Define heart disease and high blood pressure. Discus how high blood pressure affects the body and ways to reduce high blood pressure.   Exercise and Your Heart -Discuss why it is important to exercise, the FITT principles of exercise, normal and abnormal responses to exercise, and how to exercise safely.   Angina -Discuss definition of angina, causes of angina, treatment of angina, and how to decrease risk of having angina.   Cardiac Medications -Review what the following cardiac medications are used for, how they affect the body, and side effects that may occur when taking the medications.  Medications include Aspirin, Beta blockers, calcium channel blockers, ACE Inhibitors, angiotensin receptor blockers, diuretics, digoxin, and antihyperlipidemics.   Congestive Heart Failure -Discuss the definition of CHF, how to live with CHF, the signs and symptoms of CHF, and how keep track of weight and sodium intake.   Heart Disease and Intimacy -Discus the effect sexual activity has on the heart, how changes occur during intimacy as we age, and safety during sexual activity.   Smoking Cessation / COPD -Discuss different methods to quit smoking, the health benefits of quitting smoking, and the  definition of COPD.   Nutrition I: Fats -Discuss the types of cholesterol, what cholesterol does to the heart, and how cholesterol levels can be controlled.   Nutrition II: Labels -Discuss the different components of food labels and how to read food label   Heart Parts/Heart Disease and PAD -Discuss the anatomy of the heart, the pathway of blood circulation through the heart, and these are affected by heart  disease.   Stress I: Signs and Symptoms -Discuss the causes of stress, how stress may lead to anxiety and depression, and ways to limit stress.   Stress II: Relaxation -Discuss different types of relaxation techniques to limit stress.   Warning Signs of Stroke / TIA -Discuss definition of a stroke, what the signs and symptoms are of a stroke, and how to identify when someone is having stroke.   Knowledge Questionnaire Score:  Knowledge Questionnaire Score - 01/04/22 1207       Knowledge Questionnaire Score   Pre Score 13/24             Core Components/Risk Factors/Patient Goals at Admission:  Personal Goals and Risk Factors at Admission - 01/04/22 1218       Core Components/Risk Factors/Patient Goals on Admission   Hypertension Yes    Intervention Provide education on lifestyle modifcations including regular physical activity/exercise, weight management, moderate sodium restriction and increased consumption of fresh fruit, vegetables, and low fat dairy, alcohol moderation, and smoking cessation.;Monitor prescription use compliance.    Expected Outcomes Short Term: Continued assessment and intervention until BP is < 140/71mm HG in hypertensive participants. < 130/68mm HG in hypertensive participants with diabetes, heart failure or chronic kidney disease.;Long Term: Maintenance of blood pressure at goal levels.    Lipids Yes    Intervention Provide education and support for participant on nutrition & aerobic/resistive exercise along with prescribed medications to  achieve LDL '70mg'$ , HDL >$Remo'40mg'ZqIKd$ .    Expected Outcomes Short Term: Participant states understanding of desired cholesterol values and is compliant with medications prescribed. Participant is following exercise prescription and nutrition guidelines.;Long Term: Cholesterol controlled with medications as prescribed, with individualized exercise RX and with personalized nutrition plan. Value goals: LDL < $Rem'70mg'PdAf$ , HDL > 40 mg.             Core Components/Risk Factors/Patient Goals Review:    Core Components/Risk Factors/Patient Goals at Discharge (Final Review):    ITP Comments:  ITP Comments     Row Name 01/04/22 1201           ITP Comments Dr Fransico Him MD, Medical Director                Comments: Sunil  attended orientation on 01/04/2022 to review rules and guidelines for program.  Completed 6 minute walk test, Intitial ITP, and exercise prescription.  Telemetry-Sinus rhythm Bundle, branch block. Samik stopped at 4 minutes 10 seconds until 5 minutes due to leg fatigue. Mckinnley used a cane for stability. Rodderick is deconditioned. Blood pressures were elevated post 6 minute walk test. See previous documentation as Cecilie Kicks NP was notified. Safety measures and social distancing in place per CDC guidelines. Exercise is currently on hold due to elevated BP.Harrell Gave RN BSN

## 2022-01-04 NOTE — Progress Notes (Signed)
Cardiac Rehab Medication Review by a Nurse  Does the patient  feel that his/her medications are working for him/her?  YES   Has the patient been experiencing any side effects to the medications prescribed?  YES   Does the patient measure his/her own blood pressure or blood glucose at home?  YES   Does the patient have any problems obtaining medications due to transportation or finances?    NO  Understanding of regimen: fair Understanding of indications: fair Potential of compliance: fair    Nurse comments: Joshua Wyatt says he is taking his medications as prescribed and has a fair understanding of what his medications are for. Joshua Wyatt says he is taking his blood pressures at home and continues to get high readings. Will ask Joshua Wyatt to bring his medication bottles in next week and his home blood pressure cuff.    Christa [facility] RN 01/04/2022 10:39 AM

## 2022-01-05 ENCOUNTER — Emergency Department (HOSPITAL_COMMUNITY): Payer: Medicare Other

## 2022-01-05 ENCOUNTER — Inpatient Hospital Stay (HOSPITAL_COMMUNITY)
Admission: EM | Admit: 2022-01-05 | Discharge: 2022-01-07 | DRG: 291 | Disposition: A | Discharge status: Home / Self Care | Payer: Medicare Other | Attending: Cardiovascular Disease | Admitting: Cardiovascular Disease

## 2022-01-05 ENCOUNTER — Telehealth: Payer: Self-pay | Admitting: Cardiovascular Disease

## 2022-01-05 ENCOUNTER — Encounter (HOSPITAL_COMMUNITY): Payer: Self-pay | Admitting: Emergency Medicine

## 2022-01-05 DIAGNOSIS — I350 Nonrheumatic aortic (valve) stenosis: Secondary | ICD-10-CM | POA: Diagnosis present

## 2022-01-05 DIAGNOSIS — E782 Mixed hyperlipidemia: Secondary | ICD-10-CM | POA: Diagnosis present

## 2022-01-05 DIAGNOSIS — N1832 Chronic kidney disease, stage 3b: Secondary | ICD-10-CM

## 2022-01-05 DIAGNOSIS — I4891 Unspecified atrial fibrillation: Secondary | ICD-10-CM | POA: Diagnosis not present

## 2022-01-05 DIAGNOSIS — E7800 Pure hypercholesterolemia, unspecified: Secondary | ICD-10-CM | POA: Diagnosis not present

## 2022-01-05 DIAGNOSIS — I5042 Chronic combined systolic (congestive) and diastolic (congestive) heart failure: Secondary | ICD-10-CM | POA: Insufficient documentation

## 2022-01-05 DIAGNOSIS — I110 Hypertensive heart disease with heart failure: Secondary | ICD-10-CM | POA: Diagnosis not present

## 2022-01-05 DIAGNOSIS — Z7901 Long term (current) use of anticoagulants: Secondary | ICD-10-CM

## 2022-01-05 DIAGNOSIS — I739 Peripheral vascular disease, unspecified: Secondary | ICD-10-CM | POA: Diagnosis not present

## 2022-01-05 DIAGNOSIS — I130 Hypertensive heart and chronic kidney disease with heart failure and stage 1 through stage 4 chronic kidney disease, or unspecified chronic kidney disease: Principal | ICD-10-CM | POA: Diagnosis present

## 2022-01-05 DIAGNOSIS — Z20822 Contact with and (suspected) exposure to covid-19: Secondary | ICD-10-CM | POA: Diagnosis not present

## 2022-01-05 DIAGNOSIS — Z7982 Long term (current) use of aspirin: Secondary | ICD-10-CM | POA: Diagnosis not present

## 2022-01-05 DIAGNOSIS — Z888 Allergy status to other drugs, medicaments and biological substances status: Secondary | ICD-10-CM

## 2022-01-05 DIAGNOSIS — I447 Left bundle-branch block, unspecified: Secondary | ICD-10-CM

## 2022-01-05 DIAGNOSIS — I5021 Acute systolic (congestive) heart failure: Secondary | ICD-10-CM | POA: Diagnosis not present

## 2022-01-05 DIAGNOSIS — I517 Cardiomegaly: Secondary | ICD-10-CM | POA: Diagnosis not present

## 2022-01-05 DIAGNOSIS — I5043 Acute on chronic combined systolic (congestive) and diastolic (congestive) heart failure: Secondary | ICD-10-CM | POA: Diagnosis not present

## 2022-01-05 DIAGNOSIS — R001 Bradycardia, unspecified: Secondary | ICD-10-CM | POA: Diagnosis present

## 2022-01-05 DIAGNOSIS — Z87442 Personal history of urinary calculi: Secondary | ICD-10-CM

## 2022-01-05 DIAGNOSIS — I2510 Atherosclerotic heart disease of native coronary artery without angina pectoris: Secondary | ICD-10-CM | POA: Diagnosis not present

## 2022-01-05 DIAGNOSIS — T50905A Adverse effect of unspecified drugs, medicaments and biological substances, initial encounter: Secondary | ICD-10-CM | POA: Diagnosis present

## 2022-01-05 DIAGNOSIS — F419 Anxiety disorder, unspecified: Secondary | ICD-10-CM | POA: Diagnosis present

## 2022-01-05 DIAGNOSIS — M19042 Primary osteoarthritis, left hand: Secondary | ICD-10-CM | POA: Diagnosis present

## 2022-01-05 DIAGNOSIS — I480 Paroxysmal atrial fibrillation: Secondary | ICD-10-CM

## 2022-01-05 DIAGNOSIS — I509 Heart failure, unspecified: Secondary | ICD-10-CM

## 2022-01-05 DIAGNOSIS — Z955 Presence of coronary angioplasty implant and graft: Secondary | ICD-10-CM | POA: Diagnosis not present

## 2022-01-05 DIAGNOSIS — I10 Essential (primary) hypertension: Secondary | ICD-10-CM | POA: Diagnosis not present

## 2022-01-05 DIAGNOSIS — E1122 Type 2 diabetes mellitus with diabetic chronic kidney disease: Secondary | ICD-10-CM | POA: Diagnosis present

## 2022-01-05 DIAGNOSIS — N179 Acute kidney failure, unspecified: Secondary | ICD-10-CM

## 2022-01-05 DIAGNOSIS — I9789 Other postprocedural complications and disorders of the circulatory system, not elsewhere classified: Secondary | ICD-10-CM | POA: Diagnosis not present

## 2022-01-05 DIAGNOSIS — Z96653 Presence of artificial knee joint, bilateral: Secondary | ICD-10-CM | POA: Diagnosis present

## 2022-01-05 DIAGNOSIS — Z79899 Other long term (current) drug therapy: Secondary | ICD-10-CM | POA: Diagnosis not present

## 2022-01-05 DIAGNOSIS — G4700 Insomnia, unspecified: Secondary | ICD-10-CM | POA: Diagnosis present

## 2022-01-05 DIAGNOSIS — J811 Chronic pulmonary edema: Secondary | ICD-10-CM | POA: Diagnosis not present

## 2022-01-05 DIAGNOSIS — G720 Drug-induced myopathy: Secondary | ICD-10-CM | POA: Diagnosis not present

## 2022-01-05 DIAGNOSIS — E1151 Type 2 diabetes mellitus with diabetic peripheral angiopathy without gangrene: Secondary | ICD-10-CM | POA: Diagnosis present

## 2022-01-05 DIAGNOSIS — Z951 Presence of aortocoronary bypass graft: Secondary | ICD-10-CM

## 2022-01-05 DIAGNOSIS — K219 Gastro-esophageal reflux disease without esophagitis: Secondary | ICD-10-CM | POA: Diagnosis present

## 2022-01-05 LAB — CBC
HCT: 41.3 % (ref 39.0–52.0)
Hemoglobin: 12.9 g/dL — ABNORMAL LOW (ref 13.0–17.0)
MCH: 29.1 pg (ref 26.0–34.0)
MCHC: 31.2 g/dL (ref 30.0–36.0)
MCV: 93 fL (ref 80.0–100.0)
Platelets: 204 10*3/uL (ref 150–400)
RBC: 4.44 MIL/uL (ref 4.22–5.81)
RDW: 14.8 % (ref 11.5–15.5)
WBC: 8.8 10*3/uL (ref 4.0–10.5)
nRBC: 0 % (ref 0.0–0.2)

## 2022-01-05 LAB — RESP PANEL BY RT-PCR (FLU A&B, COVID) ARPGX2
Influenza A by PCR: NEGATIVE
Influenza B by PCR: NEGATIVE
SARS Coronavirus 2 by RT PCR: NEGATIVE

## 2022-01-05 LAB — BASIC METABOLIC PANEL
Anion gap: 7 (ref 5–15)
BUN: 21 mg/dL (ref 8–23)
CO2: 24 mmol/L (ref 22–32)
Calcium: 9.3 mg/dL (ref 8.9–10.3)
Chloride: 104 mmol/L (ref 98–111)
Creatinine, Ser: 1.81 mg/dL — ABNORMAL HIGH (ref 0.61–1.24)
GFR, Estimated: 38 mL/min — ABNORMAL LOW (ref >60)
Glucose, Bld: 113 mg/dL — ABNORMAL HIGH (ref 70–99)
Potassium: 4.1 mmol/L (ref 3.5–5.1)
Sodium: 135 mmol/L (ref 135–145)

## 2022-01-05 LAB — TROPONIN I (HIGH SENSITIVITY)
Troponin I (High Sensitivity): 43 ng/L — ABNORMAL HIGH (ref <18)
Troponin I (High Sensitivity): 39 ng/L — ABNORMAL HIGH (ref <18)

## 2022-01-05 LAB — BRAIN NATRIURETIC PEPTIDE: B Natriuretic Peptide: 2203.4 pg/mL — ABNORMAL HIGH (ref 0.0–100.0)

## 2022-01-05 IMAGING — DX DG CHEST 2V
2 series · 2 of 2 positions shown · non-contrast
Comparison: Chest x-ray [DATE]

CLINICAL DATA: Chest pain

EXAM:
CHEST - 2 VIEW

[w chest pa]
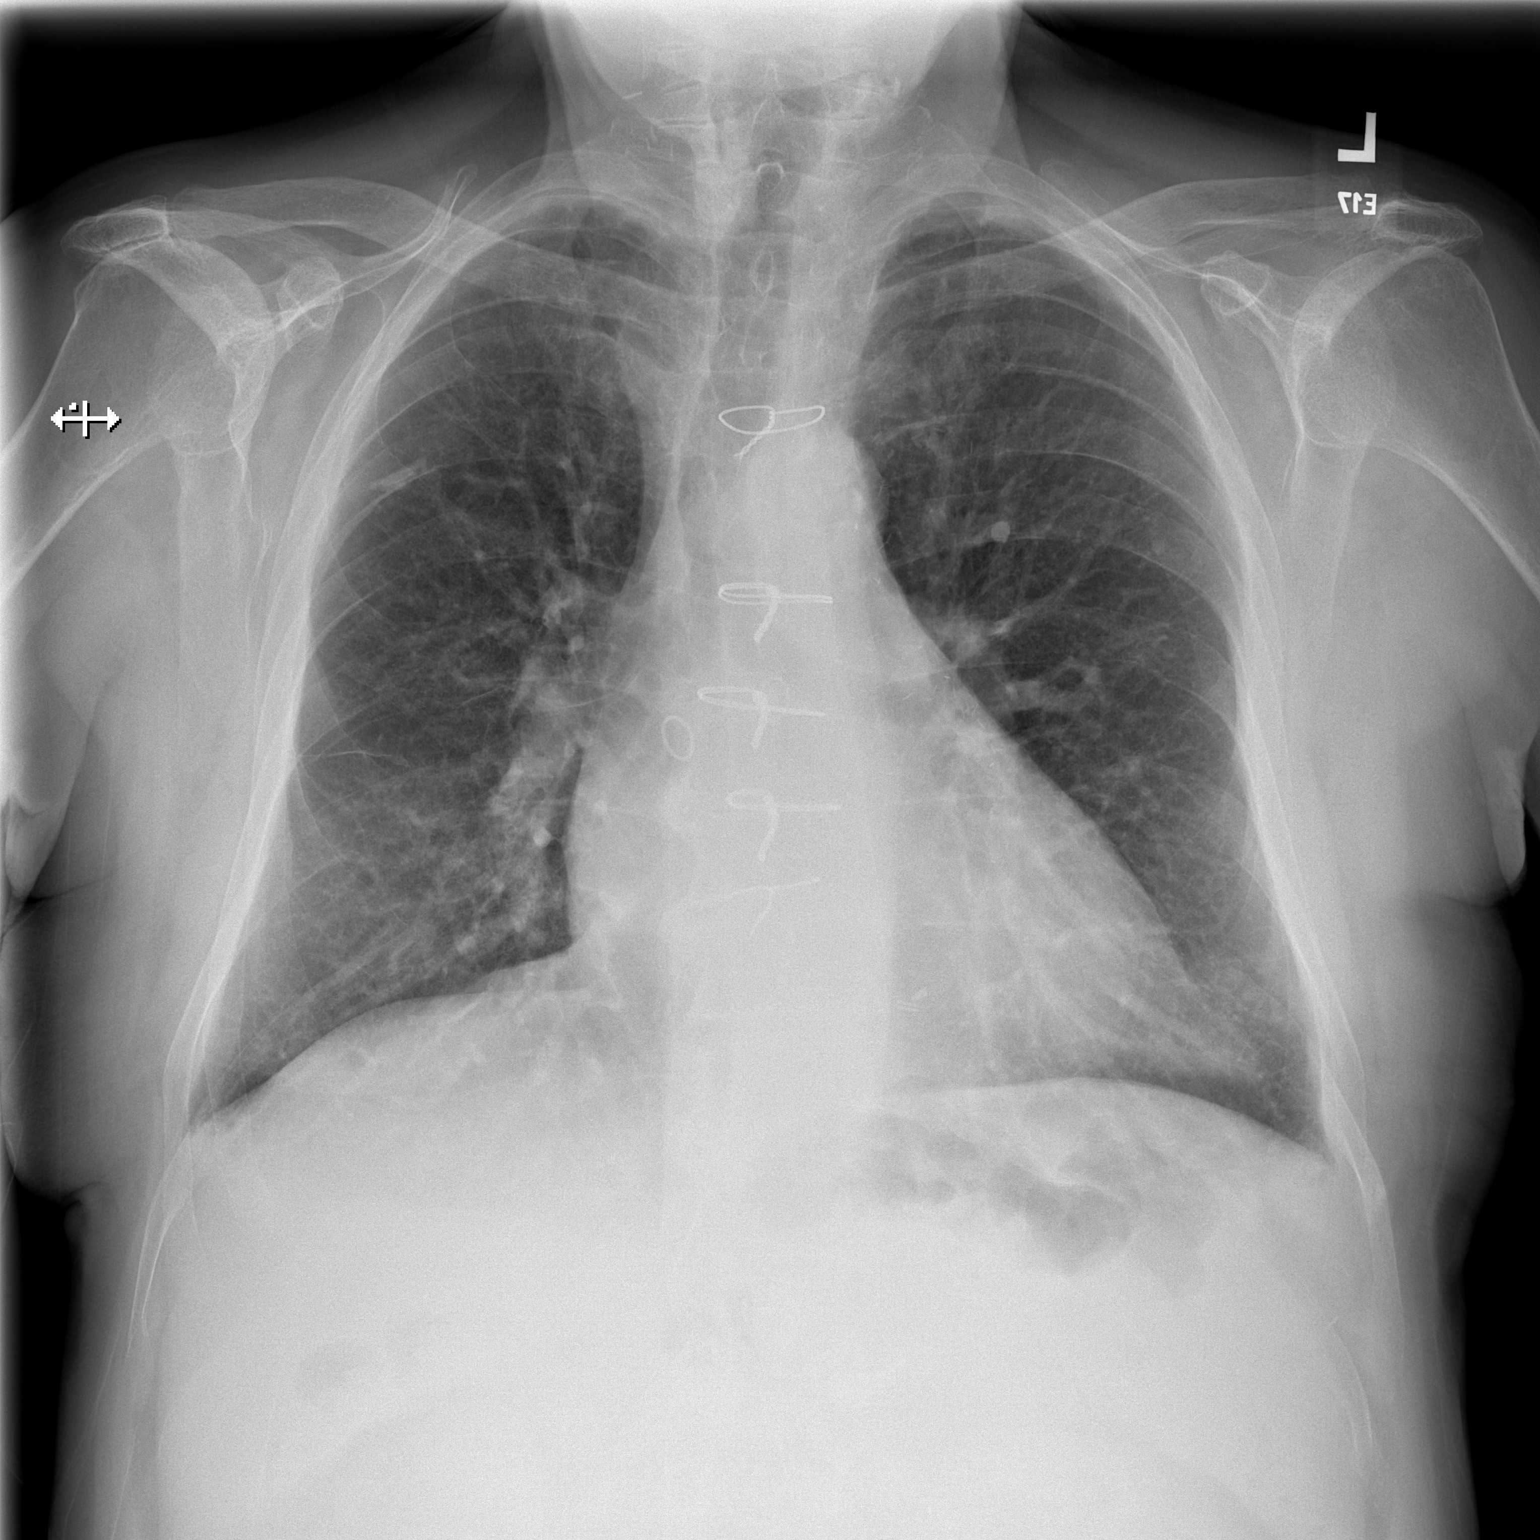

[w chest lat]
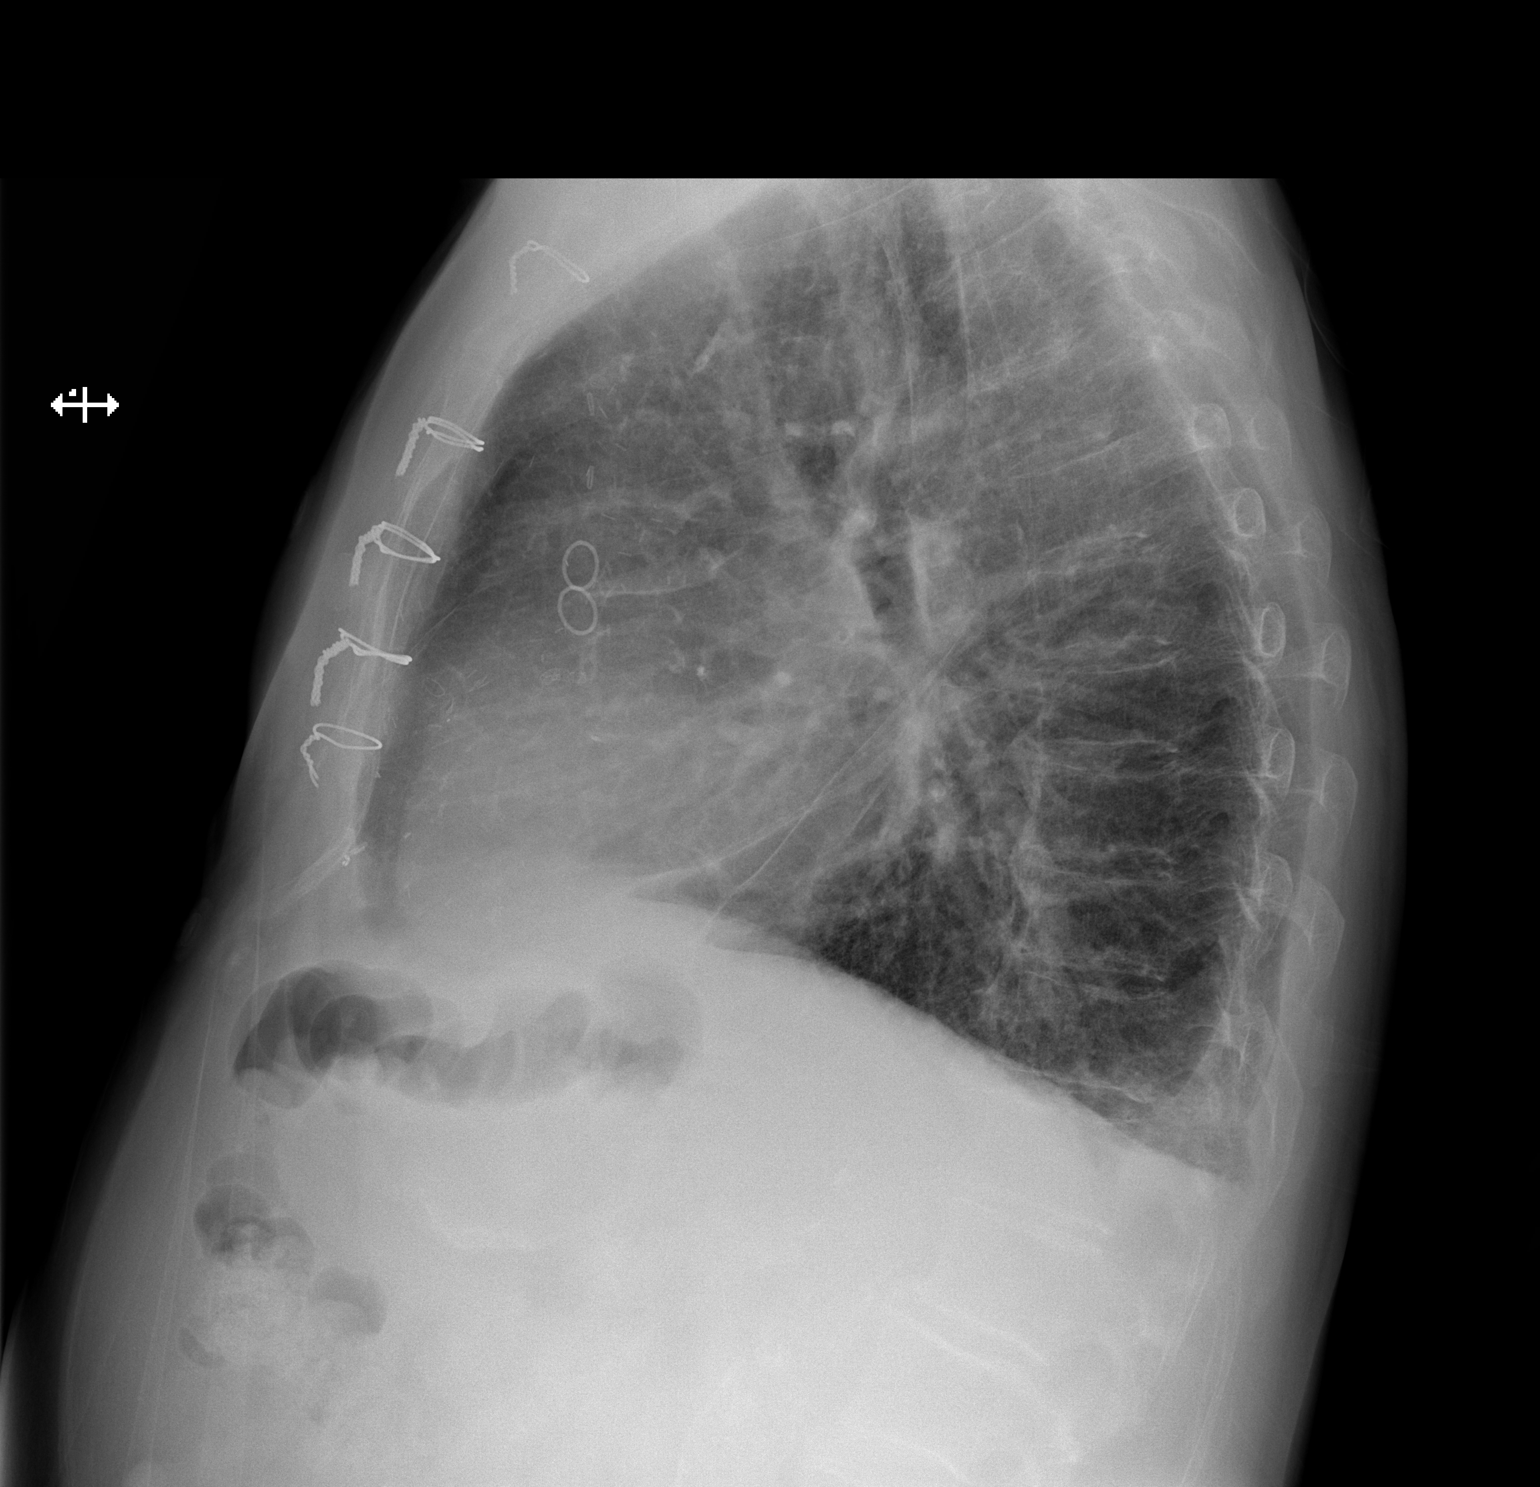

[2 of 2 positions shown; findings below may reference images not displayed]

FINDINGS: Cardiomegaly and mediastinum appear unchanged. Calcified plaques in
the aortic arch. Cardiac surgical changes and median sternotomy
wires. Pulmonary vascular congestion with no focal consolidation
identified. Small linear opacity in the right upper lung zone may
represent atelectasis. No pleural effusion or pneumothorax.
IMPRESSION: Cardiomegaly and pulmonary vascular congestion.

## 2022-01-05 MED ORDER — ONDANSETRON HCL 4 MG/2ML IJ SOLN: 4.0000 mg | Freq: Four times a day (QID) | INTRAMUSCULAR | Status: DC | PRN

## 2022-01-05 MED ORDER — EZETIMIBE 10 MG PO TABS
10.0000 mg | ORAL_TABLET | Freq: Every day | ORAL | Status: DC
  Administered 2022-01-06 – 2022-01-07 (×2): 10 mg via ORAL
  Filled 2022-01-05 (×2): qty 1

## 2022-01-05 MED ORDER — AMIODARONE HCL 200 MG PO TABS
200.0000 mg | ORAL_TABLET | Freq: Every day | ORAL | Status: DC
  Administered 2022-01-05 – 2022-01-06 (×2): 200 mg via ORAL
  Filled 2022-01-05 (×2): qty 1

## 2022-01-05 MED ORDER — ISOSORBIDE DINITRATE 10 MG PO TABS
10.0000 mg | ORAL_TABLET | Freq: Three times a day (TID) | ORAL | Status: DC
  Administered 2022-01-05 – 2022-01-07 (×7): 10 mg via ORAL
  Filled 2022-01-05 (×8): qty 1

## 2022-01-05 MED ORDER — SODIUM CHLORIDE 0.9% FLUSH
3.0000 mL | Freq: Two times a day (BID) | INTRAVENOUS | Status: DC
  Administered 2022-01-05 – 2022-01-07 (×4): 3 mL via INTRAVENOUS

## 2022-01-05 MED ORDER — SODIUM CHLORIDE 0.9% FLUSH: 3.0000 mL | INTRAVENOUS | Status: DC | PRN

## 2022-01-05 MED ORDER — FUROSEMIDE 10 MG/ML IJ SOLN
40.0000 mg | Freq: Once | INTRAMUSCULAR | Status: AC
Start: 2022-01-05 — End: 2022-01-05
  Administered 2022-01-05: 40 mg via INTRAVENOUS
  Filled 2022-01-05: qty 4

## 2022-01-05 MED ORDER — SODIUM CHLORIDE 0.9 % IV SOLN: 250.0000 mL | INTRAVENOUS | Status: DC | PRN

## 2022-01-05 MED ORDER — PANTOPRAZOLE SODIUM 40 MG PO TBEC
40.0000 mg | DELAYED_RELEASE_TABLET | Freq: Every day | ORAL | Status: DC
  Administered 2022-01-06 – 2022-01-07 (×2): 40 mg via ORAL
  Filled 2022-01-05 (×2): qty 1

## 2022-01-05 MED ORDER — FUROSEMIDE 10 MG/ML IJ SOLN
40.0000 mg | Freq: Every day | INTRAMUSCULAR | Status: DC
  Administered 2022-01-06: 40 mg via INTRAVENOUS
  Filled 2022-01-05: qty 4

## 2022-01-05 MED ORDER — ASPIRIN EC 81 MG PO TBEC
81.0000 mg | DELAYED_RELEASE_TABLET | Freq: Every morning | ORAL | Status: DC
  Administered 2022-01-06 – 2022-01-07 (×2): 81 mg via ORAL
  Filled 2022-01-05 (×2): qty 1

## 2022-01-05 MED ORDER — HYDRALAZINE HCL 50 MG PO TABS
50.0000 mg | ORAL_TABLET | Freq: Three times a day (TID) | ORAL | Status: DC
  Administered 2022-01-05 (×2): 50 mg via ORAL
  Filled 2022-01-05 (×2): qty 1

## 2022-01-05 MED ORDER — APIXABAN 5 MG PO TABS
5.0000 mg | ORAL_TABLET | Freq: Two times a day (BID) | ORAL | Status: DC
Start: 2022-01-05 — End: 2022-01-07
  Administered 2022-01-05 – 2022-01-07 (×4): 5 mg via ORAL
  Filled 2022-01-05 (×4): qty 1

## 2022-01-05 MED ORDER — ACETAMINOPHEN 325 MG PO TABS: 650.0000 mg | ORAL_TABLET | ORAL | Status: DC | PRN

## 2022-01-05 MED ORDER — EMPAGLIFLOZIN 10 MG PO TABS
10.0000 mg | ORAL_TABLET | Freq: Every day | ORAL | Status: DC
  Administered 2022-01-05 – 2022-01-07 (×3): 10 mg via ORAL
  Filled 2022-01-05 (×3): qty 1

## 2022-01-05 MED ORDER — METOPROLOL TARTRATE 25 MG PO TABS
25.0000 mg | ORAL_TABLET | Freq: Two times a day (BID) | ORAL | Status: DC
  Administered 2022-01-05 – 2022-01-06 (×2): 25 mg via ORAL
  Filled 2022-01-05 (×2): qty 1

## 2022-01-05 NOTE — Telephone Encounter (Signed)
Pt c/o of Chest Pain: STAT if CP now or developed within 24 hours  1. Are you having CP right now? yes  2. Are you experiencing any other symptoms (ex. SOB, nausea, vomiting, sweating)? nausea  3. How long have you been experiencing CP?   4. Is your CP continuous or coming and going? continuous  5. Have you taken Nitroglycerin? Yes . Patient took two this morning ?

## 2022-01-05 NOTE — ED Provider Notes (Signed)
[facility] EMERGENCY DEPARTMENT Provider Note   CSN: [id number] Arrival date & time: 01/05/22  1123     History  Chief Complaint  Patient presents with   Shortness of Breath    Joshua Wyatt is a 76 y.o. male with extensive Cardiovascular history who presents today with concern for shortness of breath for the last 2 weeks and intermittent hypertension for the last 3 weeks.  Of note patient had CABG in November 2022.  Has history of carotid endarterectomy bilaterally in 2007, anticoagulated on Eliquis.  He states that when he lies flat he has significant shortness of breath that turns into chest tightness and chest pain.  Improves with sublingual nitroglycerin.  Said that he has not taken approximately 5-6 doses of this week of nitroglycerin including 2 today prior to arrival to the emergency department.  He was seen in the ED 1 week ago today for hypertension with a reassuring work-up.  Has not called his cardiologist for follow-up.  He denies any chest pain today or shortness of breath when he is not lying flat.  Denies any significant lower extremity edema.  Patient 20 mg of Lasix at home as needed for fluid retention, cannot tell me if he has taken any in the last few days.  Denies missing any doses of his Eliquis or his antihypertensive medication.  I personally reviewed this patient's medical records.  In addition to the above listed he has history of peripheral  disease, hypertension, history of percutaneous coronary angioplasty and with CABG x3.  Paroxysmal A. fib and aortic stenosis.  Left ventricular ejection fraction 40 to 45% on 12/06/2021.  HPI     Home Medications Prior to Admission medications   Medication Sig Start Date End Date Taking? Authorizing Provider  acetaminophen (TYLENOL) 500 MG tablet Take 500-1,000 mg by mouth every 6 (six) hours as needed (for pain).    [provider]  amiodarone (PACERONE) 200 MG tablet Take 1 tablet (200 mg  total) by mouth daily. For 3 days then decrease the dose to 1 tablet ($RemoveB'200mg'vCnbyCgL$ ) by mouth twice daily. Patient taking differently: Take 200 mg by mouth daily. 11/10/21   Lorretta Harp, MD  apixaban (ELIQUIS) 5 MG TABS tablet Take 1 tablet (5 mg total) by mouth 2 (two) times daily. 11/03/21   Antony Odea, PA-C  aspirin EC 81 MG tablet Take 81 mg by mouth in the morning. Swallow whole.    [provider]  ezetimibe (ZETIA) 10 MG tablet Take 1 tablet (10 mg total) by mouth daily. 11/10/21   Lorretta Harp, MD  furosemide (LASIX) 20 MG tablet Take 1 tablet (20 mg total) by mouth daily. Patient taking differently: Take 20 mg by mouth daily as needed for edema. Take 1 Tablet As Needed for swelling and weight gain 3 lbs overnight and 5 lbs. In a Week. 11/10/21 02/08/22  Lorretta Harp, MD  losartan (COZAAR) 25 MG tablet Take 1 tablet (25 mg total) by mouth daily. 11/03/21 11/03/22  [name], MD  metoprolol tartrate (LOPRESSOR) 25 MG tablet Take 1 tablet (25 mg total) by mouth 2 (two) times daily. 12/22/21 03/22/22  Lendon Colonel, NP  nitroGLYCERIN (NITROSTAT) 0.4 MG SL tablet PLACE 1 TABLET UNDER THE TONGUE  EVERY  5  MINUTES  AS  NEEDED  FOR  CHEST  PAIN 10/24/21   Lorretta Harp, MD  pantoprazole (PROTONIX) 40 MG tablet Take 1 tablet by mouth once daily 09/13/21  Lorretta Harp, MD  polyethylene glycol (MIRALAX / GLYCOLAX) 17 g packet Take 17 g by mouth daily as needed for mild constipation. 08/30/21   Irving Copas, PA-C  REPATHA PUSHTRONEX SYSTEM 420 MG/3.5ML SOCT Inject 420 mg into the skin every 30 (thirty) days. 10/02/21   [provider]      Allergies    Statins    Review of Systems   Review of Systems  Constitutional: Negative.   HENT: Negative.    Eyes: Negative.   Respiratory:  Positive for chest tightness and shortness of breath. Negative for cough.   Cardiovascular:  Positive for chest pain. Negative for palpitations and leg  swelling.  Gastrointestinal: Negative.   Genitourinary: Negative.   Musculoskeletal: Negative.   Neurological: Negative.   Hematological: Negative.    Physical Exam Updated Vital Signs BP (!) 169/91 (BP Location: Right Arm)    Pulse 62    Temp 98 F (36.7 C) (Oral)    Resp 18    SpO2 95%  Physical Exam Vitals and nursing note reviewed.  Constitutional:      Appearance: He is not ill-appearing or toxic-appearing.  HENT:     Head: Normocephalic and atraumatic.     Nose: Nose normal.     Mouth/Throat:     Mouth: Mucous membranes are moist.     Pharynx: Oropharynx is clear. Uvula midline. No oropharyngeal exudate or posterior oropharyngeal erythema.     Tonsils: No tonsillar exudate.  Eyes:     General: Lids are normal. Vision grossly intact.        Right eye: No discharge.        Left eye: No discharge.     Extraocular Movements: Extraocular movements intact.     Conjunctiva/sclera: Conjunctivae normal.     Pupils: Pupils are equal, round, and reactive to light.  Neck:     Trachea: Trachea and phonation normal.  Cardiovascular:     Rate and Rhythm: Normal rate and regular rhythm.     Pulses:          Dorsalis pedis pulses are 1+ on the right side and 1+ on the left side.     Heart sounds: Normal heart sounds. No murmur heard. Pulmonary:     Effort: Pulmonary effort is normal. No tachypnea, accessory muscle usage or respiratory distress.     Breath sounds: Normal breath sounds. No wheezing or rales.  Chest:     Chest wall: No mass, swelling, tenderness, crepitus or edema.  Abdominal:     General: Bowel sounds are normal. There is no distension.     Tenderness: There is no abdominal tenderness. There is no right CVA tenderness, left CVA tenderness, guarding or rebound.  Musculoskeletal:        General: No deformity.     Cervical back: Normal range of motion and neck supple. No edema, rigidity or crepitus. No pain with movement, spinous process tenderness or muscular  tenderness.     Right lower leg: No tenderness. No edema.     Left lower leg: No tenderness. No edema.  Skin:    General: Skin is warm and dry.     Capillary Refill: Capillary refill takes less than 2 seconds.  Neurological:     General: No focal deficit present.     Mental Status: He is alert and oriented to person, place, and time. Mental status is at baseline.     GCS: GCS eye subscore is 4. GCS verbal subscore is 5.  GCS motor subscore is 6.     Gait: Gait is intact.  Psychiatric:        Mood and Affect: Mood normal.    ED Results / Procedures / Treatments   Labs (all labs ordered are listed, but only abnormal results are displayed) Labs Reviewed  CBC - Abnormal; Notable for the following components:      Result Value   Hemoglobin 12.9 (*)    All other components within normal limits  BASIC METABOLIC PANEL  BRAIN NATRIURETIC PEPTIDE  TROPONIN I (HIGH SENSITIVITY)    EKG None  Radiology DG Chest 2 View  Result Date: 01/05/2022 CLINICAL DATA:  Chest pain EXAM: CHEST - 2 VIEW COMPARISON:  Chest x-ray 12/29/2021 FINDINGS: Cardiomegaly and mediastinum appear unchanged. Calcified plaques in the aortic arch. Cardiac surgical changes and median sternotomy wires. Pulmonary vascular congestion with no focal consolidation identified. Small linear opacity in the right upper lung zone may represent atelectasis. No pleural effusion or pneumothorax. IMPRESSION: Cardiomegaly and pulmonary vascular congestion. Electronically Signed   By: Jannifer Hick M.D.   On: 01/05/2022 11:50    Procedures Procedures    Medications Ordered in ED Medications - No data to display  ED Course/ Medical Decision Making/ A&P Clinical Course as of 01/05/22 1719  Fri Jan 05, 2022  1436 Consult to hospital medicine, Dr. Ophelia Charter, who recommends contacting cardiology to see if they would prefer to admit this patient to their service.  Consult to cardiology coordinator, Rosann Auerbach, who states they will evaluate  the patient in the emergency department and communicate with [place] if they choose not to admit the patient.  [RS]  1529 Patient evaluated at the bedside by Mardella Layman, cardiology NP who is agreeable to admitting this patient to cardiology service.  [RS]    Clinical Course User Index [RS] Aarnav Steagall, Eugene Gavia, PA-C                           Medical Decision Making 76 year male who presents with concern for shortness of breath and orthopnea for the last 3 weeks.  Differential diagnosis includes is not limited to ACS, PE, pleural effusion, CHF exacerbation.  Hypertensive on intake, vital signs otherwise normal.  Cardiopulmonary exam unremarkable, abdominal exam is benign.  Patient with out lower extremity edema.  Amount and/or Complexity of Data Reviewed Labs: ordered.    Details: CBC with mild anemia of 12.9 near patient's baseline.  BMP with creatinine of 1.8 at patient's baseline.  Troponin elevated to 43 significantly increased from patient's baseline of 20, doubt troponin improved to 39.  BNP elevated 2200. Radiology: ordered.    Details: CXR with vascular congestion  Risk Prescription drug management. Decision regarding hospitalization.   IV Lasix ordered, consult to cardiology Dr. Wyline Mood as above.  Patient benefit admission to the hospital for stabilization and management of his acute CHF exacerbation.  No further work-up warranted in the ER at this time.  Tremulous understanding with medical evaluation and treatment plan.  Each of his questions was answered to his expressed satisfaction.  He is amenable plan for admission at this time.   This chart was dictated using voice recognition software, Dragon. Despite the best efforts of this provider to proofread and correct errors, errors may still occur which can change documentation meaning.  Final Clinical Impression(s) / ED Diagnoses Final diagnoses:  None    Rx / DC Orders ED Discharge Orders     None  Aura Dials 01/05/22 2001    Lucrezia Starch, MD 01/06/22 Drema Halon

## 2022-01-05 NOTE — H&P (Addendum)
Cardiology H&P:   Patient ID: Joshua Wyatt MRN: [id number]; DOB: June 04, 1946  Admit date: 01/05/2022 Date of Consult: 01/05/2022  PCP:  Janie Morning, [place] [facility] Cardiologist:  [name], MD     Patient Profile:   Joshua Wyatt is a 76 y.o. male with a hx of CAD status post 3v CABG (LIMA to LAD, SVG to OM, SVG to PDA), hypertension, hyperlipidemia, postop atrial fibrillation, HFrEF, mild to moderate AS, PAD s/p multiple interventions, carotid artery disease status post bilateral carotid endarterectomies who is being seen 01/05/2022 for the evaluation of CHF at the request of Dr. Roslynn Amble.  History of Present Illness:   Joshua Wyatt is a 76 year old male with past medical history noted above.  He has been followed by Dr. Gwenlyn Found as an outpatient.  He has undergone multiple peripheral vascular angiogram interventions.  Underwent peripheral angiography on 10/2014 with recannulization of subtotally occluded right SFA with overlapping stents to popliteal artery.  Had patent mid to distal left SFA stent with high-grade segmental disease in the proximal SFA and distal SFA on the left.  He had carotid stenting 12/2016.  On 01/2017 underwent lower extremity angiography was occluded distal left SFA with [place] Ball[place] atherectomy of in-stent restenosis.  Underwent cardiac catheterization 03/2018 with PCI/DES to RCA.    He was admitted after an outpatient cardiac catheterization 10/2021 in the setting of accelerated chest pain which noted severe multivessel CAD as well as left main disease.  The following day he underwent three-vessel CABG with Dr. Roxan Hockey with LIMA to LAD, SVG to OM and SVG to PDA.  Hospital course was complicated by postoperative atrial fibrillation but converted to sinus rhythm on amiodarone.  He was placed on Eliquis prior to discharge.  Discharged home on 11/03/2021.  Seen back in the office with Dr. Alvester Chou shortly thereafter and complained of increased bilateral  lower extremity edema.  He was started on Lasix 20 mg daily.  His amiodarone was reduced from 200 mg twice daily to 200 mg daily.  Recommendations to see back in 3 months with consideration of stopping Eliquis.  He had a repeat renal ultrasound 12/07/2021 with no change from prior study as well as repeat bilateral ABI/TBI's which showed worsening disease compared to prior studies but repeat lower extremity arterial duplex unchanged.  Last seen in the office on 12/22/2021 Joshua Browner, Joshua Wyatt and reported general fatigue with occasional lightheadedness.  His heart rate was noted to be 45.  Also noted occasional nosebleeds on Eliquis but resolved quickly.  He was continued on aspirin, Eliquis, losartan, metoprolol, Zetia and Repatha.  His metoprolol was decreased to 25 mg daily given his bradycardia.  It was also recommended that he decrease his Lasix to as needed use for lower extremity edema or weight gain.  Review of office phone calls note several encounters with ongoing hypertension.    Seen in the ED on 1/20 with elevated BPs in the 170s. He was instructed to increase his losartan to $RemoveBef'50mg'PuCKyLusIz$  daily and follow up with his cardiologist.   Called the office today with complaints of shortness of breath/worsening LE edema and was instructed to present to the ED for further evaluation.  In talking with him he reports continue to have trouble with blood pressures, as well as exertional dyspnea. His LE edema has actually significantly improvement. No abd fullness. No chest pain.   Labs in the ED showed sodium 135, potassium 4.1, creatinine 1.8, BNP 2230, high-sensitivity troponin 43>> 39, WBC 8.8,  hemoglobin 12.9. CXR with pulmonary congestion. He was given IV lasix $Remove'40mg'JLDAVyT$  x1 while in the ED.    Past Medical History:  Diagnosis Date   Arthritis    "LITTLE IN MY LEFT HAND" (05/12/2018)   CAD (coronary artery disease)    a. cath 02/24/18 - 90% ost RCA; 40% ost & pro LAD   Deviated septum    RIGHT   GERD  (gastroesophageal reflux disease)    History of kidney stones    Hyperlipemia    Hypertension 03/30/2013   PV Angiogram- rt. renal artery stent was widely open,50-60% lt. renal artery stenosis,lt. SFA stents patent with high grade above the knee  poplital stenosis   Normal cardiac stress test 03/25/2013   EF 60%, normal stress test ,this is a presurgical  test   PVD (peripheral vascular disease) ([place])    A. 2009: S/P PTA/stent to D SFA. B. 01/2017: angiosculpt atherectomy/drug-eluting balloon angioplasty to L SFA.   Renal artery stenosis ([place]) 03/02/2013   renal artery doppler-this was an abnormal doppler    Past Surgical History:  Procedure Laterality Date   APPENDECTOMY  1960's   CAROTID ENDARTERECTOMY Bilateral 2000's   CORONARY ANGIOPLASTY WITH STENT PLACEMENT  03/13/2018   CORONARY ARTERY BYPASS GRAFT N/A 10/27/2021   Procedure: CORONARY ARTERY BYPASS GRAFTING (CABG) X THREE ON PUMP USING LEFT INTERNAL MAMMARY ARTERY AND RIGHT ENDOSCOPIC GREATER SAPHENOUS VEIN CONDUITS;  Surgeon: [name], MD;  Location: [place];  Service: Open Heart Surgery;  Laterality: N/A;   CORONARY STENT INTERVENTION N/A 03/13/2018   Procedure: CORONARY STENT INTERVENTION;  Surgeon: Lorretta Harp, MD;  Location: [place] [facility];  Service: Cardiovascular;  Laterality: N/A;   CYSTOSCOPY W/ STONE MANIPULATION  "X 4 or 5"   ENDOVEIN HARVEST OF GREATER SAPHENOUS VEIN Right 10/27/2021   Procedure: ENDOVEIN HARVEST OF GREATER SAPHENOUS VEIN;  Surgeon: [name], MD;  Location: [facility];  Service: Open Heart Surgery;  Laterality: Right;   LEFT HEART CATH AND CORONARY ANGIOGRAPHY N/A 02/24/2018   Procedure: LEFT HEART CATH AND CORONARY ANGIOGRAPHY;  Surgeon: Lorretta Harp, MD;  Location: [place] [facility];  Service: Cardiovascular;  Laterality: N/A;   LEFT HEART CATH AND CORONARY ANGIOGRAPHY N/A 10/26/2021   Procedure: LEFT HEART CATH AND CORONARY ANGIOGRAPHY;  Surgeon: Lorretta Harp, MD;   Location: [place] [facility];  Service: Cardiovascular;  Laterality: N/A;   LITHOTRIPSY  "several"   LOWER EXTREMITY ANGIOGRAM N/A 10/11/2014   Procedure: LOWER EXTREMITY ANGIOGRAM;  Surgeon: Lorretta Harp, MD;  Location: [facility] CATH LAB;  Service: Cardiovascular;  Laterality: N/A;   LOWER EXTREMITY ANGIOGRAPHY N/A 02/04/2017   Procedure: Lower Extremity Angiography;  Surgeon: Lorretta Harp, MD;  Location: [place] [facility];  Service: Cardiovascular;  Laterality: N/A;   LOWER EXTREMITY ANGIOGRAPHY N/A 05/12/2018   Procedure: LOWER EXTREMITY ANGIOGRAPHY;  Surgeon: Lorretta Harp, MD;  Location: [place] [facility];  Service: Cardiovascular;  Laterality: N/A;   PERCUTANEOUS STENT INTERVENTION  10/11/2014   Procedure: PERCUTANEOUS STENT INTERVENTION;  Surgeon: Lorretta Harp, MD;  Location: [facility] CATH LAB;  Service: Cardiovascular;;  right sfax2   PERIPHERAL VASCULAR ATHERECTOMY  05/12/2018   Procedure: PERIPHERAL VASCULAR ATHERECTOMY;  Surgeon: Lorretta Harp, MD;  Location: [place] [facility];  Service: Cardiovascular;;  left SFA   PERIPHERAL VASCULAR CATHETERIZATION N/A 01/03/2017   Procedure: Carotid PTA/Stent Intervention / Right;  Surgeon: Lorretta Harp, MD;  Location: [place] [facility];  Service:  Cardiovascular;  Laterality: N/A;   PERIPHERAL VASCULAR INTERVENTION Left 02/04/2017   Procedure: Peripheral Vascular Intervention;  Surgeon: Lorretta Harp, MD;  Location: [place] [facility];  Service: Cardiovascular;  Laterality: Left;  left SFA   POPLITEAL ARTERY ANGIOPLASTY Left 03/30/2013   TEE WITHOUT CARDIOVERSION N/A 10/27/2021   Procedure: TRANSESOPHAGEAL ECHOCARDIOGRAM (TEE);  Surgeon: [name], MD;  Location: [place];  Service: Open Heart Surgery;  Laterality: N/A;   TONSILLECTOMY  1960's   TOTAL KNEE ARTHROPLASTY Right 05/06/2019   Procedure: TOTAL KNEE ARTHROPLASTY;  Surgeon: Latanya Maudlin, MD;  Location: [facility];  Service: Orthopedics;  Laterality: Right;  152min    TOTAL KNEE ARTHROPLASTY Left 08/29/2021   Procedure: TOTAL KNEE ARTHROPLASTY;  Surgeon: Paralee Cancel, MD;  Location: [facility];  Service: Orthopedics;  Laterality: Left;     Home Medications:  Prior to Admission medications   Medication Sig Start Date End Date Taking? Authorizing Provider  acetaminophen (TYLENOL) 500 MG tablet Take 500-1,000 mg by mouth every 6 (six) hours as needed (for pain).    [provider]  amiodarone (PACERONE) 200 MG tablet Take 1 tablet (200 mg total) by mouth daily. For 3 days then decrease the dose to 1 tablet ($RemoveB'200mg'FXbTqKKZ$ ) by mouth twice daily. Patient taking differently: Take 200 mg by mouth daily. 11/10/21   Lorretta Harp, MD  apixaban (ELIQUIS) 5 MG TABS tablet Take 1 tablet (5 mg total) by mouth 2 (two) times daily. 11/03/21   Antony Odea, PA-C  aspirin EC 81 MG tablet Take 81 mg by mouth in the morning. Swallow whole.    [provider]  ezetimibe (ZETIA) 10 MG tablet Take 1 tablet (10 mg total) by mouth daily. 11/10/21   Lorretta Harp, MD  furosemide (LASIX) 20 MG tablet Take 1 tablet (20 mg total) by mouth daily. Patient taking differently: Take 20 mg by mouth daily as needed for edema. Take 1 Tablet As Needed for swelling and weight gain 3 lbs overnight and 5 lbs. In a Week. 11/10/21 02/08/22  Lorretta Harp, MD  losartan (COZAAR) 25 MG tablet Take 1 tablet (25 mg total) by mouth daily. 11/03/21 11/03/22  [name], MD  metoprolol tartrate (LOPRESSOR) 25 MG tablet Take 1 tablet (25 mg total) by mouth 2 (two) times daily. 12/22/21 03/22/22  Lendon Colonel, Joshua Wyatt  nitroGLYCERIN (NITROSTAT) 0.4 MG SL tablet PLACE 1 TABLET UNDER THE TONGUE  EVERY  5  MINUTES  AS  NEEDED  FOR  CHEST  PAIN 10/24/21   Lorretta Harp, MD  pantoprazole (PROTONIX) 40 MG tablet Take 1 tablet by mouth once daily 09/13/21   Lorretta Harp, MD  polyethylene glycol (MIRALAX / GLYCOLAX) 17 g packet Take 17 g by mouth daily as needed for mild  constipation. 08/30/21   Irving Copas, PA-C  REPATHA PUSHTRONEX SYSTEM 420 MG/3.5ML SOCT Inject 420 mg into the skin every 30 (thirty) days. 10/02/21   [provider]    Inpatient Medications: Scheduled Meds:   Continuous Infusions:  PRN Meds:   Allergies:    Allergies  Allergen Reactions   Statins Swelling and Other (See Comments)    Muscle pain, also    Social History:   Social History   Socioeconomic History   Marital status: Widowed    Spouse name: Not on file   Number of children: Not on file   Years of education: 7   Highest education level: 7th grade  Occupational History  Occupation: Retired  Tobacco Use   Smoking status: Former    Packs/day: 1.00    Years: 58.00    Pack years: 58.00    Types: Cigars, Cigarettes    Quit date: 10/18/2013    Years since quitting: 8.2   Smokeless tobacco: Never  Vaping Use   Vaping Use: Never used  Substance and Sexual Activity   Alcohol use: Not Currently   Drug use: Never   Sexual activity: Yes  Other Topics Concern   Not on file  Social History Narrative   Not on file   Social Determinants of Health   Financial Resource Strain: Not on file  Food Insecurity: Not on file  Transportation Needs: Not on file  Physical Activity: Not on file  Stress: Not on file  Social Connections: Not on file  Intimate Partner Violence: Not on file    Family History:    Family History  Problem Relation Age of Onset   Heart disease Father        CABG   Healthy Sister    Healthy Brother      ROS:  Please see the history of present illness.   All other ROS reviewed and negative.     Physical Exam/Data:   Vitals:   01/05/22 1519 01/05/22 1520 01/05/22 1521 01/05/22 1530  BP:    (!) 193/92  Pulse: 61 62 61 (!) 54  Resp: 20 16 20  (!) 23  Temp:      TempSrc:      SpO2: 97% 100% 98% 98%  Weight:      Height:       No intake or output data in the 24 hours ending 01/05/22 1613 Last 3 Weights 01/05/2022  01/04/2022 12/29/2021  Weight (lbs) 165 lb 166 lb 3.6 oz 161 lb  Weight (kg) 74.844 kg 75.4 kg 73.029 kg     Body mass index is 25.46 kg/m.  General:  Well nourished, well developed, in no acute distress HEENT: normal Neck: no JVD Vascular: No carotid bruits; Distal pulses 2+ bilaterally Cardiac:  normal S1, S2; RRR; soft systolic murmur  Lungs:  diminished in bases Abd: soft, nontender, no hepatomegaly  Ext: no edema Musculoskeletal:  No deformities, BUE and BLE strength normal and equal Skin: warm and dry  Neuro:  CNs 2-12 intact, no focal abnormalities noted Psych:  Normal affect   EKG:  The EKG was personally reviewed and demonstrates:  SR, 62 bpm with LBBB ( noted post CABG)  Relevant CV Studies:  Cath: 10/26/2021  IMPRESSION: Mr. Finelli'  previously placed RCA stent is patent although he appears to have an 80% ostial stenosis with damping using a 5 French catheter.  He also has "a napkin ring" 90% distal left main lesion and 40 to 50% segmental proximal LAD stenosis.  He had runs of ventricular tachycardia with contrast injection into the left main.  I believe the best revascularization strategy would be CABG.  Femoral closure was not performed because of a ostial right SFA stent.  The sheath will be removed and pressure held.  The patient left lab in stable condition.  TCTS has been consulted.   Barry Dienes. MD, [facility] 10/26/2021 8:32 AM  Diagnostic Dominance: Right   Echo: 12/06/21  IMPRESSIONS     1. Left ventricular ejection fraction, by estimation, is 40 to 45%. The  left ventricle has mildly decreased function. The left ventricle  demonstrates global hypokinesis. There is mild concentric left ventricular  hypertrophy. Left ventricular diastolic  parameters are consistent with Grade II diastolic dysfunction  (pseudonormalization). Elevated left ventricular end-diastolic pressure.   2. Right ventricular systolic function is mildly reduced. The right  ventricular  size is normal. Tricuspid regurgitation signal is inadequate  for assessing PA pressure.   3. Left atrial size was mildly dilated.   4. The mitral valve is normal in structure. Mild to moderate mitral valve  regurgitation. No evidence of mitral stenosis.   5. The aortic valve is calcified. There is mild calcification of the  aortic valve. Aortic valve regurgitation is not visualized. Mild to  moderate aortic valve stenosis. Aortic valve area, by VTI measures 1.28  cm. Aortic valve mean gradient measures  12.5 mmHg. Aortic valve Vmax measures 2.57 m/s.   6. The inferior vena cava is normal in size with greater than 50%  respiratory variability, suggesting right atrial pressure of 3 mmHg.   FINDINGS   Left Ventricle: Left ventricular ejection fraction, by estimation, is 40  to 45%. The left ventricle has mildly decreased function. The left  ventricle demonstrates global hypokinesis. The left ventricular internal  cavity size was normal in size. There is   mild concentric left ventricular hypertrophy. Left ventricular diastolic  parameters are consistent with Grade II diastolic dysfunction  (pseudonormalization). Elevated left ventricular end-diastolic pressure.   Right Ventricle: The right ventricular size is normal. No increase in  right ventricular wall thickness. Right ventricular systolic function is  mildly reduced. Tricuspid regurgitation signal is inadequate for assessing  PA pressure.   Left Atrium: Left atrial size was mildly dilated.   Right Atrium: Right atrial size was normal in size.   Pericardium: There is no evidence of pericardial effusion.   Mitral Valve: The mitral valve is normal in structure. Mild to moderate  mitral valve regurgitation. No evidence of mitral valve stenosis.   Tricuspid Valve: The tricuspid valve is normal in structure. Tricuspid  valve regurgitation is not demonstrated. No evidence of tricuspid  stenosis.   Aortic Valve: The aortic valve is  calcified. There is mild calcification  of the aortic valve. There is mild to moderate aortic valve annular  calcification. Aortic valve regurgitation is not visualized. Mild to  moderate aortic stenosis is present. Aortic  valve mean gradient measures 12.5 mmHg. Aortic valve peak gradient  measures 26.4 mmHg. Aortic valve area, by VTI measures 1.28 cm.   Pulmonic Valve: The pulmonic valve was normal in structure. Pulmonic valve  regurgitation is mild. No evidence of pulmonic stenosis.   Aorta: The aortic root is normal in size and structure.   Venous: The inferior vena cava is normal in size with greater than 50%  respiratory variability, suggesting right atrial pressure of 3 mmHg.   IAS/Shunts: No atrial level shunt detected by color flow Doppler.   Laboratory Data:  High Sensitivity Troponin:   Recent Labs  Lab 12/29/21 1842 12/29/21 2055 01/05/22 1202 01/05/22 1331  TROPONINIHS 22* 22* 43* 39*     Chemistry Recent Labs  Lab 12/29/21 1842 01/05/22 1202  NA 139 135  K 4.8 4.1  CL 102 104  CO2 27 24  GLUCOSE 106* 113*  BUN 24* 21  CREATININE 1.99* 1.81*  CALCIUM 9.9 9.3  GFRNONAA 34* 38*  ANIONGAP 10 7    No results for input(s): PROT, ALBUMIN, AST, ALT, ALKPHOS, BILITOT in the last 168 hours. Lipids No results for input(s): CHOL, TRIG, HDL, LABVLDL, LDLCALC, CHOLHDL in the last 168 hours.  Hematology Recent Labs  Lab 12/29/21 1842 01/05/22  1202  WBC 8.0 8.8  RBC 4.66 4.44  HGB 13.9 12.9*  HCT 43.6 41.3  MCV 93.6 93.0  MCH 29.8 29.1  MCHC 31.9 31.2  RDW 14.6 14.8  PLT 213 204   Thyroid No results for input(s): TSH, FREET4 in the last 168 hours.  BNP Recent Labs  Lab 01/05/22 1222  BNP 2,203.4*    DDimer No results for input(s): DDIMER in the last 168 hours.   Radiology/Studies:  DG Chest 2 View  Result Date: 01/05/2022 CLINICAL DATA:  Chest pain EXAM: CHEST - 2 VIEW COMPARISON:  Chest x-ray 12/29/2021 FINDINGS: Cardiomegaly and mediastinum  appear unchanged. Calcified plaques in the aortic arch. Cardiac surgical changes and median sternotomy wires. Pulmonary vascular congestion with no focal consolidation identified. Small linear opacity in the right upper lung zone may represent atelectasis. No pleural effusion or pneumothorax. IMPRESSION: Cardiomegaly and pulmonary vascular congestion. Electronically Signed   By: Ofilia Neas M.D.   On: 01/05/2022 11:50     Assessment and Plan:   Joshua Wyatt is a 76 y.o. male with a hx of CAD status post 3v CABG (LIMA to LAD, SVG to OM, SVG to PDA), hypertension, hyperlipidemia, postop atrial fibrillation, HFrEF, mild to moderate AS, PAD s/p multiple interventions, carotid artery disease status post bilateral carotid endarterectomies who is being seen 01/05/2022 for the evaluation of CHF at the request of Dr. Roslynn Amble.  Acute on Chronic combined CHF: Post op EF of 40-45%, global hypokinesis, g1DD, remained unchanged on echo 11/2021. He had been on daily lasix, but recently reduced to PRN. LE edema as improved but DOE continues. BNP 2203, CXR with edema. Has been given IV lasix 40mg  in the ED with some UOP thus far.  -- continue IV lasix 40mg  daily -- repeat echo to determine if further decline in EF -- ideally needs GDMT: continue metoprolol 25mg  BID, add hydralazine 50mg  TID, add jardiance -- holding losartan in the setting of elevated Cr  CAD s/p 3v CABG: by Dr. Roxan Hockey 10/2021. No chest pain prior to admission -- continue ASA, metoprolol 25mg  BID, zetia  Post op Afib: has been on amiodarone 200mg  daily as well as Eliquis  -- currently in SR -- continue amiodarone as to defer recurrence of afib given acute CHF -- continue Eliquis  HTN: blood pressures are not well controlled -- will continue metoprolol 25mg  BID -- add hydralazine 50mg  TID -- hold losartan with elevated Cr, ideally would like to transition to Entresto if feasible   AKI vs CKD?: Cr prior to CABG 1.0-1.3, 1.4 at DC.  Now up to 1.8-1.9 -- holding losartan -- follow BMET with diuresis  HLD: hx of statin intolerance -- on Zetia and repatha PTA  Mild to Moderate AS: Echo 11/2021  Aortic  valve mean gradient measures 12.5 mmHg. Aortic valve peak gradient  measures 26.4 mmHg. Aortic valve area, by VTI measures 1.28 cm.  PAD: s/p prior left SFA stenting, as well as ostial right SFA   Risk Assessment/Risk Scores:   New York Heart Association (NYHA) Functional Class NYHA Class III  CHA2DS2-VASc Score = 5  This indicates a 7.2% annual risk of stroke. The patient's score is based upon: CHF History: 1 HTN History: 1 Diabetes History: 0 Stroke History: 0 Vascular Disease History: 1 Age Score: 2 Gender Score: 0   For questions or updates, please contact [facility] Please consult [email or web address] for contact info under    Signed, Reino Bellis, Joshua Wyatt  01/05/2022 4:13 PM  I have  seen and examined the patient along with Reino Bellis, Joshua Wyatt .  I have reviewed the chart, notes and new data.  I agree with PA/Joshua Wyatt's note.  Key new complaints: good diuresis and dyspnea markedly better since arrival. No angina. No edema Key examination changes: No overt hypervolemia (no JVD and no edema, clear lungs), mild bradycardia, paradoxically split S2, early peaking systolic murmur RUSB Key new findings / data: Markedly elevated BNP, also elevated creatinine (preCABG baseline 1.3 range; since CABG 1.5-2.0). Nominal elevation in Trop I is not clinically significant for ACS. Since CABG and probably even before amiodarone, Rx he has a broad QRS with LBBB pattern 140 ms. Echo shows profound LBBB-related dyssynchrony.  PLAN: Continue diuretics. Today's creatinine is similar to his average post CABG range. Transition from ARB to [facility] tomorrow if creatinine is stable or improved. Add SGLT2 inhibitor. Add long acting nitrates to the hydralazine. Cannot intensify beta blockers due to bradycardia/LBBB. Echo suggests he  would reap great benefit from CRT. Doubt stopping amio would resolve the LBBB, since LBBB was present at least as early as postop day 3, the day AFib occurred and amio started. He is already on Repatha and Eliquis. Adding Delene Loll and Jardiance to his Rx list would obviously increase overall cost, but his out of pocket costs will likely be unchanged since he is likely to reach "the other end of the donut hole".   Sanda Klein, MD, [place] [phone] 01/05/2022, 4:44 PM

## 2022-01-05 NOTE — Telephone Encounter (Signed)
° °  Alisha with Dr. Shelia Media office, she said this pt is RPN to their office due to stroke levels BP, the highest reading they have is 201/130 and 200/118 on 12/29/21. Pt stopped taking their call, one time she called pt and once she introduced who she was pt hang up on her. She wants heart doctor to be aware with his conditions

## 2022-01-05 NOTE — ED Triage Notes (Signed)
Patient here with complaint of shortness of breath x2 weeks and hypertension x3 weeks after an open heart surgery six weeks ago. Patient alert, oriented, speaking in complete sentences, is afebrile in triage and in no apparent distress.

## 2022-01-05 NOTE — Telephone Encounter (Signed)
Called patient to follow up on a note from [facility] from Dr. Theda Sers office regarding patient's BP. Patient stated he is currently in the ED because of his BP and SOB.

## 2022-01-05 NOTE — Telephone Encounter (Addendum)
Received call into triage from Caprice Kluver (health care power of attorney) that patient went to cardiac rehab yesterday and had a BP of 195. Today, patient is having active chest pain and took 2 ntg tablets an hour ago with no chest pain relief. His BP has not been checked today. He had CABG in November. Recommended for patient to go to the ED. Angle agreed with ED visit.

## 2022-01-06 ENCOUNTER — Inpatient Hospital Stay (HOSPITAL_COMMUNITY): Payer: Medicare Other

## 2022-01-06 DIAGNOSIS — I10 Essential (primary) hypertension: Secondary | ICD-10-CM

## 2022-01-06 DIAGNOSIS — R001 Bradycardia, unspecified: Secondary | ICD-10-CM

## 2022-01-06 DIAGNOSIS — I4891 Unspecified atrial fibrillation: Secondary | ICD-10-CM

## 2022-01-06 DIAGNOSIS — I5021 Acute systolic (congestive) heart failure: Secondary | ICD-10-CM

## 2022-01-06 DIAGNOSIS — I9789 Other postprocedural complications and disorders of the circulatory system, not elsewhere classified: Secondary | ICD-10-CM

## 2022-01-06 DIAGNOSIS — E7800 Pure hypercholesterolemia, unspecified: Secondary | ICD-10-CM

## 2022-01-06 DIAGNOSIS — N179 Acute kidney failure, unspecified: Secondary | ICD-10-CM

## 2022-01-06 DIAGNOSIS — G720 Drug-induced myopathy: Secondary | ICD-10-CM

## 2022-01-06 DIAGNOSIS — I739 Peripheral vascular disease, unspecified: Secondary | ICD-10-CM

## 2022-01-06 LAB — BASIC METABOLIC PANEL
Anion gap: 10 (ref 5–15)
BUN: 23 mg/dL (ref 8–23)
CO2: 24 mmol/L (ref 22–32)
Calcium: 9.1 mg/dL (ref 8.9–10.3)
Chloride: 102 mmol/L (ref 98–111)
Creatinine, Ser: 1.82 mg/dL — ABNORMAL HIGH (ref 0.61–1.24)
GFR, Estimated: 38 mL/min — ABNORMAL LOW (ref >60)
Glucose, Bld: 107 mg/dL — ABNORMAL HIGH (ref 70–99)
Potassium: 4.2 mmol/L (ref 3.5–5.1)
Sodium: 136 mmol/L (ref 135–145)

## 2022-01-06 LAB — ECHOCARDIOGRAM COMPLETE
AR max vel: 1.23 cm2
AV Area VTI: 1.23 cm2
AV Area mean vel: 1.12 cm2
AV Mean grad: 13.3 mmHg
AV Peak grad: 25.4 mmHg
Ao pk vel: 2.52 m/s
Area-P 1/2: 3.12 cm2
Calc EF: 47.4 %
Height: 67.5 in
S' Lateral: 3.8 cm
Single Plane A2C EF: 47.7 %
Single Plane A4C EF: 49.6 %
Weight: 2576 oz

## 2022-01-06 MED ORDER — FUROSEMIDE 40 MG PO TABS
40.0000 mg | ORAL_TABLET | Freq: Every day | ORAL | Status: DC
  Administered 2022-01-07: 40 mg via ORAL
  Filled 2022-01-06: qty 1

## 2022-01-06 MED ORDER — HYDRALAZINE HCL 25 MG PO TABS
25.0000 mg | ORAL_TABLET | Freq: Three times a day (TID) | ORAL | Status: DC
  Administered 2022-01-06 – 2022-01-07 (×4): 25 mg via ORAL
  Filled 2022-01-06 (×4): qty 1

## 2022-01-06 MED ORDER — CARVEDILOL 6.25 MG PO TABS
6.2500 mg | ORAL_TABLET | Freq: Two times a day (BID) | ORAL | Status: DC
  Administered 2022-01-06 – 2022-01-07 (×2): 6.25 mg via ORAL
  Filled 2022-01-06 (×3): qty 1

## 2022-01-06 MED ORDER — AMIODARONE HCL 100 MG PO TABS
100.0000 mg | ORAL_TABLET | Freq: Every day | ORAL | Status: DC
  Administered 2022-01-07: 100 mg via ORAL
  Filled 2022-01-06: qty 1

## 2022-01-06 MED ORDER — TRAZODONE HCL 50 MG PO TABS
25.0000 mg | ORAL_TABLET | Freq: Every day | ORAL | Status: DC
  Administered 2022-01-06: 25 mg via ORAL
  Filled 2022-01-06: qty 1

## 2022-01-06 NOTE — Progress Notes (Signed)
°  Echocardiogram 2D Echocardiogram has been performed.  Joshua Wyatt 01/06/2022, 8:58 AM

## 2022-01-06 NOTE — Progress Notes (Signed)
Progress Note  Patient Name: Joshua Wyatt Date of Encounter: 01/06/2022  [facility] Cardiologist: Nanetta Batty, MD   Subjective   Breathing is greatly improved.  No dyspnea at rest, no orthopnea. Denies angina, palpitations. Developed severe dizziness and diaphoresis after receiving a dose of hydralazine yesterday which caused abrupt drop in blood pressure, although he was never severely hypotensive (minimum 94/56). States he feels very anxious "all the time". Weight down roughly 4 pounds since admission.  Inpatient Medications    Scheduled Meds:  [START ON 01/07/2022] amiodarone  100 mg Oral Daily   apixaban  5 mg Oral BID   aspirin EC  81 mg Oral q AM   carvedilol  6.25 mg Oral BID WC   empagliflozin  10 mg Oral Daily   ezetimibe  10 mg Oral Daily   furosemide  40 mg Oral Daily   hydrALAZINE  25 mg Oral Q8H   isosorbide dinitrate  10 mg Oral TID   pantoprazole  40 mg Oral Daily   sodium chloride flush  3 mL Intravenous Q12H   Continuous Infusions:  sodium chloride     PRN Meds: sodium chloride, acetaminophen, ondansetron (ZOFRAN) IV, sodium chloride flush   Vital Signs    Vitals:   01/06/22 0331 01/06/22 0332 01/06/22 0800 01/06/22 0931  BP: (!) 143/68  (!) 148/70 (!) 195/87  Pulse: (!) 56   (!) 58  Resp: 16  17   Temp: 97.6 F (36.4 C)  98.3 F (36.8 C)   TempSrc: Oral  Oral   SpO2: 93%     Weight:  73 kg    Height:        Intake/Output Summary (Last 24 hours) at 01/06/2022 1133 Last data filed at 01/06/2022 0814 Gross per 24 hour  Intake 320 ml  Output 1400 ml  Net -1080 ml   Last 3 Weights 01/06/2022 01/05/2022 01/05/2022  Weight (lbs) 161 lb 163 lb 12.8 oz 165 lb  Weight (kg) 73.029 kg 74.3 kg 74.844 kg      Telemetry    Sinus bradycardia in the mid 50s- Personally Reviewed  ECG    Sinus bradycardia, left bundle branch block- Personally Reviewed  Physical Exam  Appears comfortable at rest GEN: No acute distress.   Neck: No  JVD Cardiac: RRR, no murmurs, rubs, or gallops.  Respiratory: Clear to auscultation bilaterally. GI: Soft, nontender, non-distended  MS: No edema; No deformity. Neuro:  Nonfocal  Psych: Normal affect   Labs    High Sensitivity Troponin:   Recent Labs  Lab 12/29/21 1842 12/29/21 2055 01/05/22 1202 01/05/22 1331  TROPONINIHS 22* 22* 43* 39*     Chemistry Recent Labs  Lab 01/05/22 1202 01/06/22 0315  NA 135 136  K 4.1 4.2  CL 104 102  CO2 24 24  GLUCOSE 113* 107*  BUN 21 23  CREATININE 1.81* 1.82*  CALCIUM 9.3 9.1  GFRNONAA 38* 38*  ANIONGAP 7 10    Lipids No results for input(s): CHOL, TRIG, HDL, LABVLDL, LDLCALC, CHOLHDL in the last 168 hours.  Hematology Recent Labs  Lab 01/05/22 1202  WBC 8.8  RBC 4.44  HGB 12.9*  HCT 41.3  MCV 93.0  MCH 29.1  MCHC 31.2  RDW 14.8  PLT 204   Thyroid No results for input(s): TSH, FREET4 in the last 168 hours.  BNP Recent Labs  Lab 01/05/22 1222  BNP 2,203.4*    DDimer No results for input(s): DDIMER in the last 168 hours.  Radiology    DG Chest 2 View  Result Date: 01/05/2022 CLINICAL DATA:  Chest pain EXAM: CHEST - 2 VIEW COMPARISON:  Chest x-ray 12/29/2021 FINDINGS: Cardiomegaly and mediastinum appear unchanged. Calcified plaques in the aortic arch. Cardiac surgical changes and median sternotomy wires. Pulmonary vascular congestion with no focal consolidation identified. Small linear opacity in the right upper lung zone may represent atelectasis. No pleural effusion or pneumothorax. IMPRESSION: Cardiomegaly and pulmonary vascular congestion. Electronically Signed   By: Ofilia Neas M.D.   On: 01/05/2022 11:50    Cardiac Studies   Cath: 10/26/2021   IMPRESSION: Mr. Harrower'  previously placed RCA stent is patent although he appears to have an 80% ostial stenosis with damping using a 5 French catheter.  He also has "a napkin ring" 90% distal left main lesion and 40 to 50% segmental proximal LAD stenosis.  He had  runs of ventricular tachycardia with contrast injection into the left main.  I believe the best revascularization strategy would be CABG.     Diagnostic Dominance: Right   Echo: 12/06/21   1. Left ventricular ejection fraction, by estimation, is 40 to 45%. The  left ventricle has mildly decreased function. The left ventricle  demonstrates global hypokinesis. There is mild concentric left ventricular  hypertrophy. Left ventricular diastolic  parameters are consistent with Grade II diastolic dysfunction  (pseudonormalization). Elevated left ventricular end-diastolic pressure.   2. Right ventricular systolic function is mildly reduced. The right  ventricular size is normal. Tricuspid regurgitation signal is inadequate  for assessing PA pressure.   3. Left atrial size was mildly dilated.   4. The mitral valve is normal in structure. Mild to moderate mitral valve  regurgitation. No evidence of mitral stenosis.   5. The aortic valve is calcified. There is mild calcification of the  aortic valve. Aortic valve regurgitation is not visualized. Mild to  moderate aortic valve stenosis. Aortic valve area, by VTI measures 1.28  cm. Aortic valve mean gradient measures  12.5 mmHg. Aortic valve Vmax measures 2.57 m/s.   6. The inferior vena cava is normal in size with greater than 50%  respiratory variability, suggesting right atrial pressure of 3 mmHg.    Patient Profile     76 y.o. male with CAD and recent three-vessel CABG (LIMA to LAD, SVG to OM, SVG to PDA November 2022), PAD with multiple previous interventions, status post bilateral carotid endarterectomy, mild-moderate AS, postop atrial fibrillation on amiodarone, hypertension, hyperlipidemia, recently developed left bundle branch block post bypass, CKD stage IIIb (baseline GFR 40-45) presenting with acute on chronic heart failure due to combined systolic and diastolic dysfunction (estimated LVEF 45%).  Assessment & Plan    CHF: Appears to  be clinically euvolemic today.  We will transition to oral diuretics.  Unfortunately he did not tolerate hydralazine at a 50 mg dose and remains hypertensive.  We will decrease the dose of hydralazine.  Added SGLT2 inhibitor.  Switch from metoprolol to carvedilol for better blood pressure reduction.  Need to monitor for at least another 24 hours with multiple medication changes.  Had a detailed discussion regarding sodium restriction, daily weight monitoring, signs and symptoms of heart failure exacerbation. CAD: Denies angina pectoris.  Nominal increase in troponin I is adynamic and not indicated of true acute coronary syndrome. LBBB:  Echo shows profound left ventricular systolic dyssynchrony due to LBBB, but EF is not as low as usual CRT indications.  If he develops other indications for pacemaker, should receive a biventricular  device. Drug-induced bradycardia: It has been a couple of months since his postoperative atrial fibrillation, we will decrease the amiodarone 200 mg once daily.  Doubt that from stopping the amiodarone will lead to resolution of the LBBB, since it occurred before amiodarone therapy was initiated. HTN: Switch from metoprolol to carvedilol for better blood pressure reduction at same heart rate reduction, and for better heart failure effect.  Reduce the dose of hydralazine since he does not tolerate the rapid onset of this medication.  Losartan is currently on hold due to mild AKI on top of his CKD 3B.  Ideally long-term he will be on Entresto. HLP: Statin intolerant due to myopathy, on Repatha. Postop AFib: I do not think he has had any true recurrence since the immediate post bypass.  Currently on amiodarone and Eliquis.  May be able to stop these medications if he remains arrhythmia free when we decrease the amiodarone dose. AKI on CKD3B: Creatinine stable with diuresis, but not back to baseline.  Monitor renal function 1 more day before starting RAAS inhibitors. Anxiety: Requests  medication for anxiety.  This prevents him from sleeping.  He brought up the fact that his wife died last year from aspiration pneumonia, not long before he had to undergo bypass surgery.  May have a component of depression.  Discussed the drawbacks of benzodiazepines if used long-term and at advanced age.  We will try to see if a low-dose of trazodone helps with his sleep.  For questions or updates, please contact [place] Please consult [email or web address] for contact info under        Signed, Sanda Klein, MD  01/06/2022, 11:33 AM

## 2022-01-06 NOTE — Evaluation (Signed)
Physical Therapy Evaluation Patient Details Name: Joshua Wyatt MRN: [id number] DOB: Sep 11, 1946 Today's Date: 01/06/2022  History of Present Illness  The pt is a 76 yo male presenting 1/27 with chest pain, SOB, and HTN. Pt found to have acute exacerbation of CHF. Of note, pt with recent CABG in November 2022 and was d/c home 11/25. PMH includes: L TKA (9/22), R TKA (5/20), CAD, CKD III, HTN, HLD, afib, HFrEF, mild-moderate AS, and PAD.   Clinical Impression  Pt in bed upon arrival of PT, agreeable to evaluation at this time. Prior to admission the pt was completely independent, living alone, not needing DME for gait or any assist with home care/ADLs. The pt now presents with minor limitations in functional mobility, endurance, and dynamic stability due to above dx, but is safe to return home with intermittent support from family once medically cleared. The pt was able to complete sit-stand transfers, hallway ambulation, and stairs without need for assist, UE support, or any LOB. The pt ambulates slowly but reports he feels he is at his baseline for mobility currently. He states he has yet to finish OPPT following TKA, and would like to resume that once d/c to achieve his goal of returning to activities such as golf in the future. Will continue to assess pt mobility and d/c plan during admission, and adjust as needed.         Recommendations for follow up therapy are one component of a multi-disciplinary discharge planning process, led by the attending physician.  Recommendations may be updated based on patient status, additional functional criteria and insurance authorization.  Follow Up Recommendations Outpatient PT (pt reports he is still in OPPT for TKA, can resume this)    Assistance Recommended at Discharge Intermittent Supervision/Assistance  Patient can return home with the following  Assistance with cooking/housework;Assist for transportation;Help with stairs or ramp for entrance     Equipment Recommendations None recommended by PT  Recommendations for Other Services       Functional Status Assessment Patient has had a recent decline in their functional status and demonstrates the ability to make significant improvements in function in a reasonable and predictable amount of time.     Precautions / Restrictions Precautions Precautions: None Restrictions Weight Bearing Restrictions: No      Mobility  Bed Mobility Overal bed mobility: Independent                  Transfers Overall transfer level: Independent Equipment used: None               General transfer comment: independent without UE support or DME    Ambulation/Gait Ambulation/Gait assistance: Supervision Gait Distance (Feet): 300 Feet Assistive device: None Gait Pattern/deviations: WFL(Within Functional Limits) Gait velocity: decreased     General Gait Details: slow but steady gait, pt reports this is his baseline speed. no overt LOB or changes in vitals     Balance Overall balance assessment: Mild deficits observed, not formally tested                                           Pertinent Vitals/Pain Pain Assessment Pain Assessment: No/denies pain    Home Living Family/patient expects to be discharged to:: Private residence Living Arrangements: Alone Available Help at Discharge: Family;Available PRN/intermittently Type of Home: House Home Access: Stairs to enter Entrance Stairs-Rails: Left Entrance Stairs-Number of Steps:  3   Home Layout: One level   Additional Comments: pt has family support, not needing. Has had TKA since CABG and had not yet finished OP PT following knee replacement    Prior Function Prior Level of Function : Independent/Modified Independent;Driving             Mobility Comments: independent, enjoys golfing, still attending OPPT for L TKA ADLs Comments: independent     Hand Dominance   Dominant Hand:  ("both")     Extremity/Trunk Assessment   Upper Extremity Assessment Upper Extremity Assessment: Overall WFL for tasks assessed    Lower Extremity Assessment Lower Extremity Assessment: Overall WFL for tasks assessed    Cervical / Trunk Assessment Cervical / Trunk Assessment: Normal  Communication   Communication: No difficulties  Cognition Arousal/Alertness: Awake/alert Behavior During Therapy: WFL for tasks assessed/performed Overall Cognitive Status: Within Functional Limits for tasks assessed                                          General Comments General comments (skin integrity, edema, etc.): VSS on RA. BP stable with mobility        Assessment/Plan    PT Assessment Patient needs continued PT services  PT Problem List Decreased strength;Decreased activity tolerance;Decreased balance;Decreased mobility;Cardiopulmonary status limiting activity       PT Treatment Interventions DME instruction;Gait training;Stair training;Functional mobility training;Therapeutic activities;Therapeutic exercise;Balance training;Patient/family education    PT Goals (Current goals can be found in the Care Plan section)  Acute Rehab PT Goals Patient Stated Goal: return to playing golf PT Goal Formulation: With patient Time For Goal Achievement: 01/20/22 Potential to Achieve Goals: Good    Frequency Min 3X/week        AM-PAC PT "6 Clicks" Mobility  Outcome Measure Help needed turning from your back to your side while in a flat bed without using bedrails?: None Help needed moving from lying on your back to sitting on the side of a flat bed without using bedrails?: None Help needed moving to and from a bed to a chair (including a wheelchair)?: None Help needed standing up from a chair using your arms (e.g., wheelchair or bedside chair)?: None Help needed to walk in hospital room?: A Little Help needed climbing 3-5 steps with a railing? : A Little 6 Click Score: 22    End of  Session   Activity Tolerance: Patient tolerated treatment well Patient left: in chair;with call bell/phone within reach Nurse Communication: Mobility status PT Visit Diagnosis: Other abnormalities of gait and mobility (R26.89)    Time: 5003-7048 PT Time Calculation (min) (ACUTE ONLY): 21 min   Charges:   PT Evaluation $PT Eval Low Complexity: 1 Low          West Carbo, PT, DPT   Acute Rehabilitation Department Pager #: [phone]   Sandra Cockayne 01/06/2022, 4:56 PM

## 2022-01-06 NOTE — Progress Notes (Signed)
Pt refused his scheduled hydralazine 50 mg po this morning.

## 2022-01-06 NOTE — Social Work (Signed)
CSW acknowledges PT eval in. Awaiting recommendation.  TOC team will continue to assist with discharge planning needs.

## 2022-01-06 NOTE — Plan of Care (Signed)
°  Problem: Education: Goal: Ability to demonstrate management of disease process will improve Outcome: Progressing   Problem: Education: Goal: Ability to verbalize understanding of medication therapies will improve Outcome: Progressing   Problem: Education: Goal: Individualized Educational Video(s) Outcome: Progressing   Problem: Education: Goal: Individualized Educational Video(s) Outcome: Progressing

## 2022-01-06 NOTE — Progress Notes (Deleted)
Patient SBP trended 83-89 sitting in chair during RN shift assessment. Patient states "I dont feel any dizziness or chest pain. I feel fine". RN took a manual BP of 88/72. Paged and notified Arrien MD. Verbal order to hold 0800 $RemoveB'40mg'[id number]$  IV lasix and to give the rest of AM meds. RN set BP to cycle q1 to monitor any changes and RN educated patient to express any chest pain, pain, or dizziness prior to ambulating. Patient express understanding.

## 2022-01-07 DIAGNOSIS — N179 Acute kidney failure, unspecified: Secondary | ICD-10-CM

## 2022-01-07 DIAGNOSIS — F419 Anxiety disorder, unspecified: Secondary | ICD-10-CM

## 2022-01-07 LAB — BASIC METABOLIC PANEL
Anion gap: 9 (ref 5–15)
BUN: 33 mg/dL — ABNORMAL HIGH (ref 8–23)
CO2: 25 mmol/L (ref 22–32)
Calcium: 9.4 mg/dL (ref 8.9–10.3)
Chloride: 102 mmol/L (ref 98–111)
Creatinine, Ser: 2.23 mg/dL — ABNORMAL HIGH (ref 0.61–1.24)
GFR, Estimated: 30 mL/min — ABNORMAL LOW (ref >60)
Glucose, Bld: 97 mg/dL (ref 70–99)
Potassium: 4.6 mmol/L (ref 3.5–5.1)
Sodium: 136 mmol/L (ref 135–145)

## 2022-01-07 MED ORDER — TRAZODONE HCL 50 MG PO TABS: 25.0000 mg | ORAL_TABLET | Freq: Every evening | ORAL | 0 refills | Status: DC | PRN

## 2022-01-07 MED ORDER — AMIODARONE HCL 200 MG PO TABS: 100.0000 mg | ORAL_TABLET | Freq: Every day | ORAL | 3 refills | Status: AC

## 2022-01-07 MED ORDER — ISOSORBIDE DINITRATE 10 MG PO TABS: 10.0000 mg | ORAL_TABLET | Freq: Three times a day (TID) | ORAL | 6 refills | Status: AC

## 2022-01-07 MED ORDER — EMPAGLIFLOZIN 10 MG PO TABS: 10.0000 mg | ORAL_TABLET | Freq: Every day | ORAL | 3 refills | Status: DC

## 2022-01-07 MED ORDER — EMPAGLIFLOZIN 10 MG PO TABS: 10.0000 mg | ORAL_TABLET | Freq: Every day | ORAL | 0 refills | Status: DC

## 2022-01-07 MED ORDER — FUROSEMIDE 40 MG PO TABS: 40.0000 mg | ORAL_TABLET | Freq: Every day | ORAL | 3 refills | Status: AC

## 2022-01-07 MED ORDER — TIZANIDINE HCL 2 MG PO TABS: 2.0000 mg | ORAL_TABLET | Freq: Two times a day (BID) | ORAL | 0 refills | Status: DC | PRN

## 2022-01-07 MED ORDER — CARVEDILOL 6.25 MG PO TABS: 6.2500 mg | ORAL_TABLET | Freq: Two times a day (BID) | ORAL | 3 refills | Status: DC

## 2022-01-07 MED ORDER — HYDRALAZINE HCL 25 MG PO TABS: 25.0000 mg | ORAL_TABLET | Freq: Three times a day (TID) | ORAL | 6 refills | Status: DC

## 2022-01-07 NOTE — Discharge Summary (Addendum)
Discharge Summary    Patient ID: ORAN DILLENBURG MRN: [id number]; DOB: [birth year]  Admit date: 01/05/2022 Discharge date: 01/07/2022  PCP:  Janie Morning, [place] Providers Cardiologist:  [name], MD    Discharge Diagnoses    Principal Problem:   Acute on chronic combined systolic and diastolic CHF (congestive heart failure) ([place]) Active Problems:   HTN (hypertension)   Mixed hyperlipidemia   Stage 3b chronic kidney disease (CKD) ([place])   CAD (coronary artery disease)   S/P CABG x 3   PAF (paroxysmal atrial fibrillation) ([place])   Anxiety   AKI (acute kidney injury) ([place])    Diagnostic Studies/Procedures    Echocardiogram 01/06/22: 1. Left ventricular ejection fraction, by estimation, is 40 to 45%. The  left ventricle has mildly decreased function. The left ventricle  demonstrates global hypokinesis. There is mild concentric left ventricular  hypertrophy. Left ventricular diastolic  parameters are consistent with Grade I diastolic dysfunction (impaired  relaxation). Elevated left atrial pressure.   2. Right ventricular systolic function is mildly reduced. The right  ventricular size is normal. Tricuspid regurgitation signal is inadequate  for assessing PA pressure.   3. Left atrial size was moderately dilated.   4. The mitral valve is normal in structure. Mild to moderate mitral valve  regurgitation.   5. The aortic valve is tricuspid. There is moderate calcification of the  aortic valve. There is moderate thickening of the aortic valve. Aortic  valve regurgitation is not visualized. Moderate aortic valve stenosis.  Aortic valve mean gradient measures  13.3 mmHg. Aortic valve Vmax measures 2.52 m/s.   Comparison(s): No significant change from prior study. Prior images  reviewed side by side.  _____________   History of Present Illness     Joshua Wyatt is a 76 y.o. male with a hx of CAD status post 3v CABG (LIMA to LAD, SVG to OM, SVG to PDA),  hypertension, hyperlipidemia, postop atrial fibrillation, HFrEF, mild to moderate AS, PAD s/p multiple interventions, carotid artery disease status post bilateral carotid endarterectomies who is being seen 01/05/2022 for the evaluation of CHF.  Joshua Wyatt is a 75 year old male with past medical history noted above.  He has been followed by Dr. Gwenlyn Found as an outpatient.  He has undergone multiple peripheral vascular angiogram interventions.  Underwent peripheral angiography on 10/2014 with recannulization of subtotally occluded right SFA with overlapping stents to popliteal artery.  Had patent mid to distal left SFA stent with high-grade segmental disease in the proximal SFA and distal SFA on the left.  He had carotid stenting 12/2016.  On 01/2017 underwent lower extremity angiography was occluded distal left SFA with [place] Balloon atherectomy of in-stent restenosis.  Underwent cardiac catheterization 03/2018 with PCI/DES to RCA.     He was admitted after an outpatient cardiac catheterization 10/2021 in the setting of accelerated chest pain which noted severe multivessel CAD as well as left main disease.  The following day he underwent three-vessel CABG with Dr. Roxan Hockey with LIMA to LAD, SVG to OM and SVG to PDA.  Hospital course was complicated by postoperative atrial fibrillation but converted to sinus rhythm on amiodarone.  He was placed on Eliquis prior to discharge.  Discharged home on 11/03/2021.  Seen back in the office with Dr. Alvester Chou shortly thereafter and complained of increased bilateral lower extremity edema.  He was started on Lasix 20 mg daily.  His amiodarone was reduced from 200 mg twice daily to 200 mg daily.  Recommendations to  see back in 3 months with consideration of stopping Eliquis.  He had a repeat renal ultrasound 12/07/2021 with no change from prior study as well as repeat bilateral ABI/TBI's which showed worsening disease compared to prior studies but repeat lower extremity arterial  duplex unchanged.   Last seen in the office on 12/22/2021 Diona Browner, NP and reported general fatigue with occasional lightheadedness.  His heart rate was noted to be 45.  Also noted occasional nosebleeds on Eliquis but resolved quickly.  He was continued on aspirin, Eliquis, losartan, metoprolol, Zetia and Repatha.  His metoprolol was decreased to 25 mg daily given his bradycardia.  It was also recommended that he decrease his Lasix to as needed use for lower extremity edema or weight gain.   Review of office phone calls note several encounters with ongoing hypertension.     Seen in the ED on 1/20 with elevated BPs in the 170s. He was instructed to increase his losartan to $RemoveBef'50mg'ummgLZYCfV$  daily and follow up with his cardiologist.    Called the office today with complaints of shortness of breath/worsening LE edema and was instructed to present to the ED for further evaluation.   In talking with him he reports continue to have trouble with blood pressures, as well as exertional dyspnea. His LE edema has actually significantly improvement. No abd fullness. No chest pain.    Labs in the ED showed sodium 135, potassium 4.1, creatinine 1.8, BNP 2230, high-sensitivity troponin 43>> 39, WBC 8.8, hemoglobin 12.9. CXR with pulmonary congestion. He was given IV lasix $Remove'40mg'WINSLgm$  x1 while in the ED.   Hospital Course     Consultants: None   1. Acute on chronic combined CHF: Patient presented with complaints of exertional dyspnea and poorly controlled blood pressures.  BNP elevated to 2203 and chest x-ray suggested edema.  Echocardiogram this admission showed EF 40-45% with global hypokinesis and mild concentric LVH, overall unchanged from previous.  He does have an LBBB at baseline with profound LV systolic dyssynchrony but at this point EF is not low enough to indicate CRT.  He was diuresed with IV Lasix 40 mg daily and transitioned to p.o. Lasix on day of discharge.  He did have a slight rise in his creatinine from 1.8-2.2  on the day of discharge.  His losartan was discontinued and decision made to transition to hydralazine plus Isordil for ongoing heart failure management.  Additionally he was transitioned from metoprolol to carvedilol for improved blood pressure control. - Continue Lasix 40 mg daily.   - Continue carvedilol 6.25 mg twice daily - Continue hydralazine 25 mg 3 times daily and Isordil 10 mg 3 times daily - Jardiance 10 mg daily was started on day of discharge as well and he was provided a 14-day coupon.  Coverage will need to be confirmed outpatient. - CHF instructions provided with instructions for daily weight monitoring and sodium restrictions - He will need to BMET at follow-up  2.  HTN: Patient presented with complaints of poorly controlled blood pressures at home.  Somewhat labile this admission with peak SBP 195 and trough 107.  Overall stable on new regimen detailed above -Continue carvedilol, hydralazine, Isordil, and Lasix  3.  Paroxysmal atrial fibrillation: This initially occurred in the postop setting following CABG.  He has maintained sinus rhythm this admission.  Decision made to reduce amiodarone from 200 mg daily to 100 mg daily.  Rate control with beta-blocker continued they will transition from metoprolol to carvedilol. Possible he may be able  to discontinue amiodarone and Eliquis if he remains arrhythmia free with reduction of amnio.   - Continue Eliquis for stroke Ppx - Continue amiodarone 100 mg daily for rhythm control - Continue carvedilol 6.25 mg twice daily for rate control  4.  Anxiety/insomnia: This is been ongoing since his wife's passing a year ago, worse since his bypass surgery.  He was trialed on low-dose trazodone which improved his sleep. - 1 month supply for as needed trazodone provided at discharge with instructions for patient to follow-up with PCP for additional refills.  5.  Acute on chronic renal insufficiency stage IIIb: Baseline creatinine appears to be  between 1.4 and 1.8.  Creatinine bumped to 2.2 this admission with diuresis prompting discontinuation of his losartan and transition to p.o. Lasix. - Plan to check BMET at follow-up - Continue to avoid nephrotoxic agents  6.  HLD: He is intolerant to statins due to myopathy. - Continue Repatha   Did the patient have an acute coronary syndrome (MI, NSTEMI, STEMI, etc) this admission?:  No                               Did the patient have a percutaneous coronary intervention (stent / angioplasty)?:  No.        The patient will be scheduled for a TOC follow up appointment for 01/15/22 with Almyra Deforest PA-C / Diona Browner, NP.  A message has been sent to the [facility] and Scheduling Pool at the office where the patient should be seen for follow up.  _____________  Discharge Vitals Blood pressure 134/65, pulse (!) 55, temperature 98.2 F (36.8 C), temperature source Oral, resp. rate 14, height 5' 7.5" (1.715 m), weight 71.6 kg, SpO2 93 %.  Filed Weights   01/05/22 1715 01/06/22 0332 01/07/22 0324  Weight: 74.3 kg 73 kg 71.6 kg    Labs & Radiologic Studies    CBC Recent Labs    01/05/22 1202  WBC 8.8  HGB 12.9*  HCT 41.3  MCV 93.0  PLT 093   Basic Metabolic Panel Recent Labs    01/06/22 0315 01/07/22 0309  NA 136 136  K 4.2 4.6  CL 102 102  CO2 24 25  GLUCOSE 107* 97  BUN 23 33*  CREATININE 1.82* 2.23*  CALCIUM 9.1 9.4   Liver Function Tests No results for input(s): AST, ALT, ALKPHOS, BILITOT, PROT, ALBUMIN in the last 72 hours. No results for input(s): LIPASE, AMYLASE in the last 72 hours. High Sensitivity Troponin:   Recent Labs  Lab 12/29/21 1842 12/29/21 2055 01/05/22 1202 01/05/22 1331  TROPONINIHS 22* 22* 43* 39*    BNP Invalid input(s): POCBNP D-Dimer No results for input(s): DDIMER in the last 72 hours. Hemoglobin A1C No results for input(s): HGBA1C in the last 72 hours. Fasting Lipid Panel No results for input(s): CHOL, HDL, LDLCALC, TRIG, CHOLHDL,  LDLDIRECT in the last 72 hours. Thyroid Function Tests No results for input(s): TSH, T4TOTAL, T3FREE, THYROIDAB in the last 72 hours.  Invalid input(s): FREET3 _____________  DG Chest 2 View  Result Date: 01/05/2022 CLINICAL DATA:  Chest pain EXAM: CHEST - 2 VIEW COMPARISON:  Chest x-ray 12/29/2021 FINDINGS: Cardiomegaly and mediastinum appear unchanged. Calcified plaques in the aortic arch. Cardiac surgical changes and median sternotomy wires. Pulmonary vascular congestion with no focal consolidation identified. Small linear opacity in the right upper lung zone may represent atelectasis. No pleural effusion or pneumothorax. IMPRESSION: Cardiomegaly  and pulmonary vascular congestion. Electronically Signed   By: Ofilia Neas M.D.   On: 01/05/2022 11:50   DG Chest 2 View  Result Date: 12/29/2021 CLINICAL DATA:  Shortness of breath and dizziness. EXAM: CHEST - 2 VIEW COMPARISON:  November 28, 2021 FINDINGS: Multiple sternal wires and vascular clips are noted. The heart size and mediastinal contours are within normal limits. Both lungs are clear. The visualized skeletal structures are unremarkable. IMPRESSION: 1. Evidence of prior median sternotomy/CABG. 2. No acute cardiopulmonary disease. Electronically Signed   By: Virgina Norfolk M.D.   On: 12/29/2021 19:30   ECHOCARDIOGRAM COMPLETE  Result Date: 01/06/2022    ECHOCARDIOGRAM REPORT   Patient Name:   Joshua Wyatt Date of Exam: 01/06/2022 Medical Rec #:  [id number]     Height:       67.5 in Accession #:    [id number]    Weight:       161.0 lb Date of Birth:  November 20, 1946    BSA:          1.854 m Patient Age:    37 years      BP:           143/68 mmHg Patient Gender: M             HR:           54 bpm. Exam Location:  Inpatient Procedure: 2D Echo, Cardiac Doppler and Color Doppler Indications:    CHF-Acute Systolic Q33.35  History:        Patient has prior history of Echocardiogram examinations, most                 recent 12/06/2021. CAD; Risk  Factors:Dyslipidemia and                 Hypertension.  Sonographer:    Bernadene Person RDCS Referring Phys: [place]  1. Left ventricular ejection fraction, by estimation, is 40 to 45%. The left ventricle has mildly decreased function. The left ventricle demonstrates global hypokinesis. There is mild concentric left ventricular hypertrophy. Left ventricular diastolic parameters are consistent with Grade I diastolic dysfunction (impaired relaxation). Elevated left atrial pressure.  2. Right ventricular systolic function is mildly reduced. The right ventricular size is normal. Tricuspid regurgitation signal is inadequate for assessing PA pressure.  3. Left atrial size was moderately dilated.  4. The mitral valve is normal in structure. Mild to moderate mitral valve regurgitation.  5. The aortic valve is tricuspid. There is moderate calcification of the aortic valve. There is moderate thickening of the aortic valve. Aortic valve regurgitation is not visualized. Moderate aortic valve stenosis. Aortic valve mean gradient measures 13.3 mmHg. Aortic valve Vmax measures 2.52 m/s. Comparison(s): No significant change from prior study. Prior images reviewed side by side. FINDINGS  Left Ventricle: There is profound left ventricular dyssynchrony due to LBBB. Left ventricular ejection fraction, by estimation, is 40 to 45%. The left ventricle has mildly decreased function. The left ventricle demonstrates global hypokinesis. The left ventricular internal cavity size was normal in size. There is mild concentric left ventricular hypertrophy. Left ventricular diastolic parameters are consistent with Grade I diastolic dysfunction (impaired relaxation). Elevated left atrial pressure. Right Ventricle: The right ventricular size is normal. No increase in right ventricular wall thickness. Right ventricular systolic function is mildly reduced. Tricuspid regurgitation signal is inadequate for assessing PA  pressure. Left Atrium: Left atrial size was moderately dilated. Right Atrium: Right atrial size was normal in  size. Pericardium: There is no evidence of pericardial effusion. Mitral Valve: The mitral valve is normal in structure. There is mild thickening of the mitral valve leaflet(s). Mild to moderate mitral valve regurgitation, with centrally-directed jet. Tricuspid Valve: The tricuspid valve is normal in structure. Tricuspid valve regurgitation is not demonstrated. Aortic Valve: Gradients underestimate severity of aortic stenosis diue to reduced systolic function/ stroke volume. The aortic valve is tricuspid. There is moderate calcification of the aortic valve. There is moderate thickening of the aortic valve. Aortic valve regurgitation is not visualized. Moderate aortic stenosis is present. Aortic valve mean gradient measures 13.3 mmHg. Aortic valve peak gradient measures 25.4 mmHg. Aortic valve area, by VTI measures 1.23 cm. Pulmonic Valve: The pulmonic valve was grossly normal. Pulmonic valve regurgitation is trivial. Aorta: The aortic root and ascending aorta are structurally normal, with no evidence of dilitation. IAS/Shunts: No atrial level shunt detected by color flow Doppler.  LEFT VENTRICLE PLAX 2D LVIDd:         5.00 cm      Diastology LVIDs:         3.80 cm      LV e' medial:    2.81 cm/s LV PW:         1.20 cm      LV E/e' medial:  21.4 LV IVS:        1.10 cm      LV e' lateral:   3.32 cm/s LVOT diam:     2.20 cm      LV E/e' lateral: 18.1 LV SV:         70 LV SV Index:   38 LVOT Area:     3.80 cm  LV Volumes (MOD) LV vol d, MOD A2C: 95.2 ml LV vol d, MOD A4C: 134.0 ml LV vol s, MOD A2C: 49.8 ml LV vol s, MOD A4C: 67.5 ml LV SV MOD A2C:     45.4 ml LV SV MOD A4C:     134.0 ml LV SV MOD BP:      59.4 ml RIGHT VENTRICLE RV S prime:     8.78 cm/s TAPSE (M-mode): 1.2 cm LEFT ATRIUM             Index        RIGHT ATRIUM           Index LA diam:        4.90 cm 2.64 cm/m   RA Area:     13.00 cm LA Vol  (A2C):   64.3 ml 34.68 ml/m  RA Volume:   29.90 ml  16.12 ml/m LA Vol (A4C):   63.3 ml 34.14 ml/m LA Biplane Vol: 66.6 ml 35.92 ml/m  AORTIC VALVE                     PULMONIC VALVE AV Area (Vmax):    1.23 cm      PR End Diast Vel: 6.86 msec AV Area (Vmean):   1.12 cm AV Area (VTI):     1.23 cm AV Vmax:           252.00 cm/s AV Vmean:          168.000 cm/s AV VTI:            0.569 m AV Peak Grad:      25.4 mmHg AV Mean Grad:      13.3 mmHg LVOT Vmax:         81.30 cm/s LVOT Vmean:  49.500 cm/s LVOT VTI:          0.184 m LVOT/AV VTI ratio: 0.32  AORTA Ao Root diam: 3.10 cm Ao Asc diam:  3.40 cm MITRAL VALVE MV Area (PHT): 3.12 cm    SHUNTS MV Decel Time: 243 msec    Systemic VTI:  0.18 m MV E velocity: 60.20 cm/s  Systemic Diam: 2.20 cm MV A velocity: 81.30 cm/s MV E/A ratio:  0.74 Mihai Croitoru MD Electronically signed by Sanda Klein MD Signature Date/Time: 01/06/2022/1:23:31 PM    Final    Disposition   Pt is being discharged home today in good condition.  Follow-up Plans & Appointments     Follow-up Information     Almyra Deforest, [place] Follow up on 01/15/2022.   Specialties: Cardiology, Radiology Why: Please arrive 15 mintues early for your 11:45am post-hospital cardiology appointment. You will see Diona Browner, NP who works with Almyra Deforest, PA-C. Contact information: [address] [place] [address] [phone]                  Discharge Medications   Allergies as of 01/07/2022       Reactions   Statins Swelling, Other (See Comments)   Muscle pain, also        Medication List     STOP taking these medications    losartan 25 MG tablet Commonly known as: Cozaar   metoprolol tartrate 25 MG tablet Commonly known as: LOPRESSOR       TAKE these medications    acetaminophen 500 MG tablet Commonly known as: TYLENOL Take 500-1,000 mg by mouth every 6 (six) hours as needed for moderate pain (for pain).   amiodarone 200 MG tablet Commonly  known as: PACERONE Take 0.5 tablets (100 mg total) by mouth daily. What changed:  how much to take additional instructions   apixaban 5 MG Tabs tablet Commonly known as: ELIQUIS Take 1 tablet (5 mg total) by mouth 2 (two) times daily.   aspirin EC 81 MG tablet Take 81 mg by mouth in the morning. Swallow whole.   carvedilol 6.25 MG tablet Commonly known as: COREG Take 1 tablet (6.25 mg total) by mouth 2 (two) times daily with a meal.   empagliflozin 10 MG Tabs tablet Commonly known as: Jardiance Take 1 tablet (10 mg total) by mouth daily before breakfast.   empagliflozin 10 MG Tabs tablet Commonly known as: JARDIANCE Take 1 tablet (10 mg total) by mouth daily for 14 days. Rx for free 14 day supply Start taking on: January 08, 2022   ezetimibe 10 MG tablet Commonly known as: ZETIA Take 1 tablet (10 mg total) by mouth daily.   furosemide 40 MG tablet Commonly known as: LASIX Take 1 tablet (40 mg total) by mouth daily. What changed:  medication strength how much to take   hydrALAZINE 25 MG tablet Commonly known as: APRESOLINE Take 1 tablet (25 mg total) by mouth every 8 (eight) hours.   isosorbide dinitrate 10 MG tablet Commonly known as: ISORDIL Take 1 tablet (10 mg total) by mouth 3 (three) times daily.   nitroGLYCERIN 0.4 MG SL tablet Commonly known as: NITROSTAT PLACE 1 TABLET UNDER THE TONGUE  EVERY  5  MINUTES  AS  NEEDED  FOR  CHEST  PAIN What changed: See the new instructions.   pantoprazole 40 MG tablet Commonly known as: PROTONIX Take 1 tablet by mouth once daily What changed: how to take this   polyethylene glycol 17 g  packet Commonly known as: MIRALAX / GLYCOLAX Take 17 g by mouth daily as needed for mild constipation.   Repatha Pushtronex System 420 MG/3.5ML Soct Generic drug: Evolocumab with Infusor Inject 420 mg into the skin every 30 (thirty) days.   tiZANidine 2 MG tablet Commonly known as: ZANAFLEX Take 1 tablet (2 mg total) by mouth 2  (two) times daily as needed for muscle spasms. What changed:  when to take this reasons to take this   traZODone 50 MG tablet Commonly known as: DESYREL Take 0.5 tablets (25 mg total) by mouth at bedtime as needed for sleep. Please contact your PCP for refills           Outstanding Labs/Studies   Will need BMET at follow-up  Duration of Discharge Encounter   Greater than 30 minutes including physician time.  Signed, Abigail Butts, PA-C 01/07/2022, 10:37 AM

## 2022-01-07 NOTE — Discharge Instructions (Addendum)
Heart Failure Education: Weigh yourself EVERY morning after you go to the bathroom but before you eat or drink anything. Write this number down in a weight log/diary. If you gain 3 pounds overnight or 5 pounds in a week, call the office. Take your medicines as prescribed. If you have concerns about your medications, please call us before you stop taking them.  Eat low salt foods--Limit salt (sodium) to 2000 mg per day. This will help prevent your body from holding onto fluid. Read food labels as many processed foods have a lot of sodium, especially canned goods and prepackaged meats. If you would like some assistance choosing low sodium foods, we would be happy to set you up with a nutritionist. Stay as active as you can everyday. Staying active will give you more energy and make your muscles stronger. Start with 5 minutes at a time and work your way up to 30 minutes a day. Break up your activities--do some in the morning and some in the afternoon. Start with 3 days per week and work your way up to 5 days as you can.  If you have chest pain, feel short of breath, dizzy, or lightheaded, STOP. If you don't feel better after a short rest, call 911. If you do feel better, call the office to let us know you have symptoms with exercise. Limit all fluids for the day to less than 2 liters. Fluid includes all drinks, coffee, juice, ice chips, soup, jello, and all other liquids.   Medication changes: - STOP metoprolol tartrate (lopressor) - this medication is being replaced with a new medication - STOP losartan (cozaar) - this medication was stopped to prevent further decline in your kidney function - START carvedilol (coreg) 6.25mg  two times per day for improved blood pressure control and ongoing heart failure management - START hydralazine (apresoline) 25mg  three times per day for heart failure management - START isosorbide dinitrate (isordil) 10mg  three times per day for heart failure management - START  empagliflozin (jardiance) 10mg  daily for heart failure management - START trazodone (desyrel) 25mg  as needed at bedtime for sleep/anxiety - you will need to follow-up with your primary care provider for additional refills going forward if this medication is helpful.   - INCREASE furosemide (lasix) to 40mg  daily (previously taking 20mg  daily) - DECREASE amiodarone to 100mg  daily (previously taking 200mg  daily)

## 2022-01-07 NOTE — TOC Transition Note (Signed)
Transition of Care [facility]) - CM/SW Discharge Note   Patient Details  Name: Joshua Wyatt MRN: [id number] Date of Birth: 1946/03/01  [facility] CM/SW Contact:  Carles Collet, RN Phone Number: 01/07/2022, 11:01 AM   Clinical Narrative:   Spoke w patient at bedside. Provided with 14 day Jardiance coupon. Patient is aware to speak with pharmacist re copay for refill and will follow up with MD outpatient in the next 2 weeks. Patient will continue OP PT as set up prior to admission. No other TOC needs identified at this time      Barriers to Discharge: No Barriers Identified   Patient Goals and CMS Choice        Discharge Placement                       Discharge Plan and Services   Discharge Planning Services: CM Consult, Medication Assistance                                 Social Determinants of Health (SDOH) Interventions     Readmission Risk Interventions No flowsheet data found.

## 2022-01-08 ENCOUNTER — Other Ambulatory Visit: Payer: Self-pay

## 2022-01-08 ENCOUNTER — Telehealth: Payer: Self-pay

## 2022-01-08 NOTE — Telephone Encounter (Signed)
Patient contacted regarding discharge f1/27/2023 - 01/07/2022 (2 days); [place]  Patient understands to follow up with provider Almyra Deforest, PA-C on 01/15/22  at 1145 am at [facility]. Patient understands discharge instructions? YES Patient understands medications and regiment? YES Patient understands to bring all medications to this visit? YES PT understands all medication changes and will bring them with him to scheduled appt

## 2022-01-08 NOTE — Telephone Encounter (Signed)
-----   Message from Abigail Butts, PA-C sent at 01/07/2022 10:35 AM EST ----- Regarding: Please place TOC call Hi there! Can you please place a TOC call for this patient's upcoming appointment 01/15/22 with Almyra Deforest, PA-C and Diona Browner, NP. Thank you!!

## 2022-01-10 ENCOUNTER — Ambulatory Visit (HOSPITAL_COMMUNITY): Payer: Medicare Other

## 2022-01-12 ENCOUNTER — Ambulatory Visit (HOSPITAL_COMMUNITY): Payer: Medicare Other

## 2022-01-14 NOTE — Progress Notes (Signed)
Office Visit    Patient Name: Joshua Wyatt Date of Encounter: 01/15/2022  Primary Care Provider:  Irena Reichmann, DO Primary Cardiologist:  Nanetta Batty, MD  Chief Complaint    76 year old male with a history of CAD s/p DES-RCA in 2019, s/p CABG x3 (10/2021), aortic stenosis, PAF, chronic combined systolic and diastolic heart failure, PAD, renal artery stenosis, hypertension, hyperlipidemia, bilateral carotid artery disease, CKD stage III, DDD, and GERD who presents for follow-up related to heart failure.  Past Medical History    Past Medical History:  Diagnosis Date   Arthritis    "LITTLE IN MY LEFT HAND" (05/12/2018)   CAD (coronary artery disease)    a. cath 02/24/18 - 90% ost RCA; 40% ost & pro LAD   Deviated septum    RIGHT   GERD (gastroesophageal reflux disease)    History of kidney stones    Hyperlipemia    Hypertension 03/30/2013   PV Angiogram- rt. renal artery stent was widely open,50-60% lt. renal artery stenosis,lt. SFA stents patent with high grade above the knee  poplital stenosis   Normal cardiac stress test 03/25/2013   EF 60%, normal stress test ,this is a presurgical  test   PVD (peripheral vascular disease) (HCC)    A. 2009: S/P PTA/stent to D SFA. B. 01/2017: angiosculpt atherectomy/drug-eluting balloon angioplasty to L SFA.   Renal artery stenosis (HCC) 03/02/2013   renal artery doppler-this was an abnormal doppler   Past Surgical History:  Procedure Laterality Date   APPENDECTOMY  1960's   CAROTID ENDARTERECTOMY Bilateral 2000's   CORONARY ANGIOPLASTY WITH STENT PLACEMENT  03/13/2018   CORONARY ARTERY BYPASS GRAFT N/A 10/27/2021   Procedure: CORONARY ARTERY BYPASS GRAFTING (CABG) X THREE ON PUMP USING LEFT INTERNAL MAMMARY ARTERY AND RIGHT ENDOSCOPIC GREATER SAPHENOUS VEIN CONDUITS;  Surgeon: Loreli Slot, MD;  Location: [facility] OR;  Service: Open Heart Surgery;  Laterality: N/A;   CORONARY STENT INTERVENTION N/A 03/13/2018   Procedure: CORONARY  STENT INTERVENTION;  Surgeon: Runell Gess, MD;  Location: [facility] INVASIVE CV LAB;  Service: Cardiovascular;  Laterality: N/A;   CYSTOSCOPY W/ STONE MANIPULATION  "X 4 or 5"   ENDOVEIN HARVEST OF GREATER SAPHENOUS VEIN Right 10/27/2021   Procedure: ENDOVEIN HARVEST OF GREATER SAPHENOUS VEIN;  Surgeon: Loreli Slot, MD;  Location: [facility] OR;  Service: Open Heart Surgery;  Laterality: Right;   LEFT HEART CATH AND CORONARY ANGIOGRAPHY N/A 02/24/2018   Procedure: LEFT HEART CATH AND CORONARY ANGIOGRAPHY;  Surgeon: Runell Gess, MD;  Location: [facility] INVASIVE CV LAB;  Service: Cardiovascular;  Laterality: N/A;   LEFT HEART CATH AND CORONARY ANGIOGRAPHY N/A 10/26/2021   Procedure: LEFT HEART CATH AND CORONARY ANGIOGRAPHY;  Surgeon: Runell Gess, MD;  Location: [facility] INVAS[facility] CV LAB;  Service: Cardiovascular;  Laterality: N/A;   LITHOTRIPSY  "several"   LOWER EXTREMITY ANGIOGRAM N/A 10/11/2014   Procedure: LOWER EXTREMITY ANGIOGRAM;  Surgeon: Runell Gess, MD;  Location[facility] CATH LAB;  Service: Cardiovascular;  Laterality: N/A;   LOWER EXTREMITY ANGIOGRAPHY N/A 02/04/2017   Procedure: Lower Extremity Angiography;  Surgeon: Runell Gess, MD;  Location: [facility] INVASIVE CV LAB;  Service: Cardiovascular;  Laterality: N/A;   LOWER EXTREMITY ANGIOGRAPHY N/A 05/12/2018   Procedure: LOWER EXTREMITY ANGIOGRAPHY;  Surgeon: Runell Gess, MD;  Location: [facility] INVASIVE CV LAB;  Service: Cardiovascular;  Laterality: N/A;   PERCUTANEOUS STENT INTERVENTION  10/11/2014   Procedure: PERCUTANEOUS STENT INTERVENTION;  Surgeon: Runell Gess, MD;  Location: [place] CATH LAB;  Service: Cardiovascular;;  right [email or web address]   PERIPHERAL VASCULAR ATHERECTOMY  05/12/2018   Procedure: PERIPHERAL VASCULAR ATHERECTOMY;  Surgeon: Lorretta Harp, MD;  Location: [place] CV LAB;  Service: Cardiovascular;;  left SFA   PERIPHERAL VASCULAR CATHETERIZATION N/A 01/03/2017   Procedure: Carotid PTA/Stent Intervention / Right;  Surgeon:  Lorretta Harp, MD;  Location: [place] CV LAB;  Service: Cardiovascular;  Laterality: N/A;   PERIPHERAL VASCULAR INTERVENTION Left 02/04/2017   Procedure: Peripheral Vascular Intervention;  Surgeon: Lorretta Harp, MD;  Location: [place] CV LAB;  Service: Cardiovascular;  Laterality: Left;  left SFA   POPLITEAL ARTERY ANGIOPLASTY Left 03/30/2013   TEE WITHOUT CARDIOVERSION N/A 10/27/2021   Procedure: TRANSESOPHAGEAL ECHOCARDIOGRAM (TEE);  Surgeon: [name], MD;  Location: [place];  Service: Open Heart Surgery;  Laterality: N/A;   TONSILLECTOMY  1960's   TOTAL KNEE ARTHROPLASTY Right 05/06/2019   Procedure: TOTAL KNEE ARTHROPLASTY;  Surgeon: Latanya Maudlin, MD;  Location: [facility] ORS;  Service: Orthopedics;  Laterality: Right;  12min   TOTAL KNEE ARTHROPLASTY Left 08/29/2021   Procedure: TOTAL KNEE ARTHROPLASTY;  Surgeon: Paralee Cancel, MD;  Location: [facility] ORS;  Service: Orthopedics;  Laterality: Left;    Allergies  Allergies  Allergen Reactions   Statins Swelling and Other (See Comments)    Muscle pain, also    History of Present Illness    76 year old male with the above past medical history including CAD s/p DES-RCA in 2019, s/p CABG x3 (10/2021), aortic stenosis, PAF, chronic combined systolic and diastolic heart failure, PAD, renal artery stenosis, hypertension, hyperlipidemia, bilateral carotid artery disease, CKD stage III, DDD, and GERD.   Has an extensive history of PAD followed closely by Dr. Alvester Chou s/p L above-the-knee [place] Ball[place] angioplasty to L popliteal in 2015, s/p bilateral SFA stenting with repeat PTCA to distal left SFA in 2018 and PTCA to proximal L SFA in 2019.  Additionally, he has a history of renal artery stenosis s/p prior right renal artery stenting. He has a history of bilateral carotid artery disease s/p bilateral carotid endarterectomy in 2007 and R ICA stenting in 2018. He also has a history of CAD s/p DES-RCA in 2019. He saw Dr. Gwenlyn Found in the  office in October 24, 2021 and reported 3 to 4-week history of exertional chest pain similar to his prior anginal equivalent. He was scheduled for cardiac catheterization which revealed patent stent to RCA, ostRCA 80%, p-mRCA 405, o-mLAD 50%, and m-dLM 90% stenosis. The following day he underwent CABG x3 (LIMA-LAD, SVG-OM, PDA). Echocardiogram showed an EF of 40- 45%, mildly decreased LV function, LV global hypokinesis, mild LVH of the basal septal segment, grade 2 diastolic dysfunction, moderate LAE, moderate AV stenosis. He did have postop A. Fib though he converted to normal sinus rhythm on amiodarone, on Eliquis. He was discharged home on November 03, 2021.     He was started on Lasix at his follow-up visit with Dr. Alvester Chou in December 2022. Amiodarone was reduced to 200 mg daily. Renal ultrasound 12/07/21 showed no change from prior study, repeat bilateral ABIs/TBIs increased compared to prior study, repeat LE arterial duplex unchanged from prior study with recommended repeat studies in 1 year per Dr. Gwenlyn Found.   He was last seen in the office on 12/22/21 and was stable overall from a cardiac standpoint. He reported some generalized fatigue and occasional lightheadedness, his HR was low, and metoprolol was decreased and Lasix was reduced to as needed use for  weight gain, edema. Unfortunately, he presented to he ED on 12/29/21 with complaints of high blood pressure. Troponin was mildly elevated at 22, EKG was unremarkable, he denied chest pain or shortness of breath. Losartan was increased and he was discharged home. He called the office on 01/05/2022 with complaints of shortness of breath, lower extremity edema and was advised to go to the emergency room. He was hospitalized from 01/05/2022 to 01/07/2022 in the setting of acute on chronic combined CHF. Repeat echocardiogram showed no significant change from prior study, EF 40 to 45%. He was diuresed with IV Lasix. BP was labile during hospitalization. Losartan was  discontinued in the setting of AKI.  Creatinine bumped to 2.2 on day of discharge. He was discharged home on hydralazine/Isordil, carvedilol, Jardiance, and Lasix.  He presents today for follow-up. Since his hospitalization he has been stable from a cardiac standpoint. His weight is up slightly since hospital discharge, however, he denies swelling, shortness of breath. BP has been well controlled since hospital discharge.  He has been walking at home, he states he is not interested in returning to cardiac rehab at this time. Overall, he reports feeling well, and denies any specific concerns or complaints today.     Current Outpatient Medications  Medication Sig Dispense Refill   acetaminophen (TYLENOL) 500 MG tablet Take 500-1,000 mg by mouth every 6 (six) hours as needed for moderate pain (for pain).     amiodarone (PACERONE) 200 MG tablet Take 0.5 tablets (100 mg total) by mouth daily. 45 tablet 3   apixaban (ELIQUIS) 5 MG TABS tablet Take 1 tablet (5 mg total) by mouth 2 (two) times daily. 60 tablet 2   aspirin EC 81 MG tablet Take 81 mg by mouth in the morning. Swallow whole.     carvedilol (COREG) 6.25 MG tablet Take 1 tablet (6.25 mg total) by mouth 2 (two) times daily with a meal. 180 tablet 3   empagliflozin (JARDIANCE) 10 MG TABS tablet Take 1 tablet (10 mg total) by mouth daily for 14 days. Rx for free 14 day supply 14 tablet 0   ezetimibe (ZETIA) 10 MG tablet Take 1 tablet (10 mg total) by mouth daily. 90 tablet 3   furosemide (LASIX) 40 MG tablet Take 1 tablet (40 mg total) by mouth daily. 90 tablet 3   hydrALAZINE (APRESOLINE) 25 MG tablet Take 1 tablet (25 mg total) by mouth every 8 (eight) hours. 90 tablet 6   nitroGLYCERIN (NITROSTAT) 0.4 MG SL tablet PLACE 1 TABLET UNDER THE TONGUE  EVERY  5  MINUTES  AS  NEEDED  FOR  CHEST  PAIN (Patient taking differently: Place 0.4 mg under the tongue every 5 (five) minutes as needed for chest pain.) 25 tablet 0   pantoprazole (PROTONIX) 40 MG  tablet Take 1 tablet by mouth once daily (Patient taking differently: 40 mg daily.) 90 tablet 3   REPATHA PUSHTRONEX SYSTEM 420 MG/3.5ML SOCT Inject 420 mg into the skin every 30 (thirty) days.     traZODone (DESYREL) 50 MG tablet Take 0.5 tablets (25 mg total) by mouth at bedtime as needed for sleep. Please contact your PCP for refills 30 tablet 0   isosorbide dinitrate (ISORDIL) 10 MG tablet Take 1 tablet (10 mg total) by mouth 3 (three) times daily. 90 tablet 6   polyethylene glycol (MIRALAX / GLYCOLAX) 17 g packet Take 17 g by mouth daily as needed for mild constipation. (Patient not taking: Reported on 01/15/2022) 14 each 0  tiZANidine (ZANAFLEX) 2 MG tablet Take 1 tablet (2 mg total) by mouth 2 (two) times daily as needed for muscle spasms. (Patient not taking: Reported on 01/15/2022) 30 tablet 0   No current facility-administered medications for this visit.     Review of Systems    He denies chest pain, palpitations, dyspnea, pnd, orthopnea, n, v, dizziness, syncope, edema, weight gain, or early satiety. All other systems reviewed and are otherwise negative except as noted above.   Physical Exam    VS:  BP 132/72    Pulse (!) 54    Ht $R'5\' 7"'NI$  (1.702 m)    Wt 166 lb 9.6 oz (75.6 kg)    SpO2 98%    BMI 26.09 kg/m   Wt Readings from Last 3 Encounters:  01/15/22 166 lb 9.6 oz (75.6 kg)  01/07/22 157 lb 14.4 oz (71.6 kg)  01/04/22 166 lb 3.6 oz (75.4 kg)  GEN: Well nourished, well developed, in no acute distress. HEENT: normal. Neck: Supple, no JVD, carotid bruits, or masses. Cardiac: RRR, 2/6 murmur, no rubs, or gallops. No clubbing, cyanosis, edema.  Radials/DP/PT 2+ and equal bilaterally.  Respiratory:  Respirations regular and unlabored, clear to auscultation bilaterally. GI: Soft, nontender, nondistended, BS + x 4. MS: no deformity or atrophy. Skin: warm and dry, no rash. Neuro:  Strength and sensation are intact. Psych: Normal affect.  Accessory Clinical Findings    ECG  personally reviewed by me today - No EKG in office today.  Lab Results  Component Value Date   WBC 8.8 01/05/2022   HGB 12.9 (L) 01/05/2022   HCT 41.3 01/05/2022   MCV 93.0 01/05/2022   PLT 204 01/05/2022   Lab Results  Component Value Date   CREATININE 2.23 (H) 01/07/2022   BUN 33 (H) 01/07/2022   NA 136 01/07/2022   K 4.6 01/07/2022   CL 102 01/07/2022   CO2 25 01/07/2022   Lab Results  Component Value Date   ALT 6 11/01/2021   AST 14 (L) 11/01/2021   ALKPHOS 106 11/01/2021   BILITOT 0.5 11/01/2021   Lab Results  Component Value Date   CHOL 86 11/02/2021   HDL 25 (L) 11/02/2021   LDLCALC 30 11/02/2021   LDLDIRECT 97 02/03/2018   TRIG 157 (H) 11/02/2021   CHOLHDL 3.4 11/02/2021    Lab Results  Component Value Date   HGBA1C 5.7 (H) 10/27/2021    Assessment & Plan    1. Acute on chronic combined systolic and diastolic heart failure:  Recently hospitalized from 01/05/2022 to 01/07/2022 for same. Most recent echo showed no significant change from prior study, EF 40 to 45%. Losartan discontinued in setting of AKI. Euvolemic and well compensated on exam. Repeat BMET today.  He does seem to have difficulty managing medications independently at home, difficulty monitoring fluid volume status independently.  Will order home health RN to assist in medication management, HF management.  Continue carvedilol, Jardiance, Lasix, hydralazine, isosorbide dinitrate.  2. CAD: S/p DES-RCA in 2019, s/p CABG x3 (LIMA-LAD, SVG-OM, SVG-PDA) in 11/22. Most recent echo as above. Stable with no anginal symptoms. No indication for ischemic evaluation at this time.  Continue ASA, carvedilol, Jardiance, Lasix, hydralazine, isosorbide dinitrate, lasix, Repatha, and Zetia.   3. Paroxysmal atrial fibrillation: Occurred post-op CABG x3.  RRR on auscultation today.  Denies bleeding. Continue Eliquis, amiodarone, metoprolol.    4. Hypertension: BP well controlled. Continue current antihypertensive  regimen.   5. Aortic stenosis: Moderate per most recent echo.  Positive murmur on exam. Currently asymptomatic.  Continue monitoring with annual echocardiogram.  6. Bilateral carotid artery disease: Recent carotid Doppler showed patent right carotid endarterectomy and stent.  Recommend repeat study in 12 months.  7. PAD/renal artery stenosis: Renal ultrasound 11/2021 no change from prior study.  Repeat bilateral ABIs/TBI's increased compared to prior study, repeat LE arterial duplex unchanged from prior study with recommended repeat studies in 1 year per Dr. Gwenlyn Found.  8. Hyperlipidemia: LDL was 30 on 10/2021.  Continue Repatha, Zetia.  9. Disposition: Follow-up as scheduled with Dr. Gwenlyn Found in March 2023.   Lenna Sciara, NP 01/15/2022, 1:11 PM

## 2022-01-15 ENCOUNTER — Ambulatory Visit: Payer: Medicare Other | Admitting: Nurse Practitioner

## 2022-01-15 ENCOUNTER — Encounter: Payer: Self-pay | Admitting: Physician Assistant

## 2022-01-15 ENCOUNTER — Ambulatory Visit (HOSPITAL_COMMUNITY): Payer: Medicare Other

## 2022-01-15 ENCOUNTER — Other Ambulatory Visit: Payer: Self-pay

## 2022-01-15 VITALS — BP 132/72 | HR 54 | Ht 67.0 in | Wt 166.6 lb

## 2022-01-15 DIAGNOSIS — I480 Paroxysmal atrial fibrillation: Secondary | ICD-10-CM

## 2022-01-15 DIAGNOSIS — I2510 Atherosclerotic heart disease of native coronary artery without angina pectoris: Secondary | ICD-10-CM | POA: Diagnosis not present

## 2022-01-15 DIAGNOSIS — I10 Essential (primary) hypertension: Secondary | ICD-10-CM

## 2022-01-15 DIAGNOSIS — I5042 Chronic combined systolic (congestive) and diastolic (congestive) heart failure: Secondary | ICD-10-CM

## 2022-01-15 DIAGNOSIS — I6523 Occlusion and stenosis of bilateral carotid arteries: Secondary | ICD-10-CM

## 2022-01-15 DIAGNOSIS — Z9889 Other specified postprocedural states: Secondary | ICD-10-CM

## 2022-01-15 DIAGNOSIS — I739 Peripheral vascular disease, unspecified: Secondary | ICD-10-CM | POA: Diagnosis not present

## 2022-01-15 DIAGNOSIS — E785 Hyperlipidemia, unspecified: Secondary | ICD-10-CM

## 2022-01-15 DIAGNOSIS — I350 Nonrheumatic aortic (valve) stenosis: Secondary | ICD-10-CM | POA: Diagnosis not present

## 2022-01-15 NOTE — Patient Instructions (Addendum)
Medication Instructions:  Your physician recommends that you continue on your current medications as directed. Please refer to the Current Medication list given to you today.  *If you need a refill on your cardiac medications before your next appointment, please call your pharmacy*   Lab Work: Your physician recommends that you return for lab work today  BMET   If you have labs (blood work) drawn today and your tests are completely normal, you will receive your results only by: MyChart Message (if you have MyChart) OR A paper copy in the mail If you have any lab test that is abnormal or we need to change your treatment, we will call you to review the results.   Testing/Procedures: NONE ordered at this time of appointment     Follow-Up: At [facility], you and your health needs are our priority.  As part of our continuing mission to provide you with exceptional heart care, we have created designated Provider Care Teams.  These Care Teams include your primary Cardiologist (physician) and Advanced Practice Providers (APPs -  Physician Assistants and Nurse Practitioners) who all work together to provide you with the care you need, when you need it.  We recommend signing up for the patient portal called "MyChart".  Sign up information is provided on this After Visit Summary.  MyChart is used to connect with patients for Virtual Visits (Telemedicine).  Patients are able to view lab/test results, encounter notes, upcoming appointments, etc.  Non-urgent messages can be sent to your provider as well.   To learn more about what you can do with MyChart, go to [email or web address].    Your next appointment:   Keep appointment    The format for your next appointment:   In Person  Provider:   [name], MD     Other Instructions

## 2022-01-16 LAB — BASIC METABOLIC PANEL
BUN/Creatinine Ratio: 17 (ref 10–24)
BUN: 34 mg/dL — ABNORMAL HIGH (ref 8–27)
CO2: 23 mmol/L (ref 20–29)
Calcium: 10.1 mg/dL (ref 8.6–10.2)
Chloride: 97 mmol/L (ref 96–106)
Creatinine, Ser: 1.97 mg/dL — ABNORMAL HIGH (ref 0.76–1.27)
Glucose: 118 mg/dL — ABNORMAL HIGH (ref 70–99)
Potassium: 5 mmol/L (ref 3.5–5.2)
Sodium: 137 mmol/L (ref 134–144)
eGFR: 34 mL/min/{1.73_m2} — ABNORMAL LOW (ref >59)

## 2022-01-17 ENCOUNTER — Ambulatory Visit (HOSPITAL_COMMUNITY): Payer: Medicare Other

## 2022-01-19 ENCOUNTER — Ambulatory Visit (HOSPITAL_COMMUNITY): Payer: Medicare Other

## 2022-01-20 DIAGNOSIS — M519 Unspecified thoracic, thoracolumbar and lumbosacral intervertebral disc disorder: Secondary | ICD-10-CM | POA: Diagnosis not present

## 2022-01-20 DIAGNOSIS — M19042 Primary osteoarthritis, left hand: Secondary | ICD-10-CM | POA: Diagnosis not present

## 2022-01-20 DIAGNOSIS — K219 Gastro-esophageal reflux disease without esophagitis: Secondary | ICD-10-CM | POA: Diagnosis not present

## 2022-01-20 DIAGNOSIS — I2510 Atherosclerotic heart disease of native coronary artery without angina pectoris: Secondary | ICD-10-CM | POA: Diagnosis not present

## 2022-01-20 DIAGNOSIS — I6523 Occlusion and stenosis of bilateral carotid arteries: Secondary | ICD-10-CM | POA: Diagnosis not present

## 2022-01-20 DIAGNOSIS — N183 Chronic kidney disease, stage 3 unspecified: Secondary | ICD-10-CM | POA: Diagnosis not present

## 2022-01-20 DIAGNOSIS — I5043 Acute on chronic combined systolic (congestive) and diastolic (congestive) heart failure: Secondary | ICD-10-CM | POA: Diagnosis not present

## 2022-01-20 DIAGNOSIS — I350 Nonrheumatic aortic (valve) stenosis: Secondary | ICD-10-CM | POA: Diagnosis not present

## 2022-01-20 DIAGNOSIS — Z7982 Long term (current) use of aspirin: Secondary | ICD-10-CM | POA: Diagnosis not present

## 2022-01-20 DIAGNOSIS — I701 Atherosclerosis of renal artery: Secondary | ICD-10-CM | POA: Diagnosis not present

## 2022-01-20 DIAGNOSIS — Z7901 Long term (current) use of anticoagulants: Secondary | ICD-10-CM | POA: Diagnosis not present

## 2022-01-20 DIAGNOSIS — Z9181 History of falling: Secondary | ICD-10-CM | POA: Diagnosis not present

## 2022-01-20 DIAGNOSIS — I739 Peripheral vascular disease, unspecified: Secondary | ICD-10-CM | POA: Diagnosis not present

## 2022-01-20 DIAGNOSIS — I480 Paroxysmal atrial fibrillation: Secondary | ICD-10-CM | POA: Diagnosis not present

## 2022-01-20 DIAGNOSIS — E785 Hyperlipidemia, unspecified: Secondary | ICD-10-CM | POA: Diagnosis not present

## 2022-01-20 DIAGNOSIS — I130 Hypertensive heart and chronic kidney disease with heart failure and stage 1 through stage 4 chronic kidney disease, or unspecified chronic kidney disease: Secondary | ICD-10-CM | POA: Diagnosis not present

## 2022-01-22 ENCOUNTER — Encounter (HOSPITAL_COMMUNITY): Payer: Self-pay

## 2022-01-22 ENCOUNTER — Telehealth: Payer: Self-pay

## 2022-01-22 ENCOUNTER — Emergency Department (HOSPITAL_COMMUNITY): Payer: Medicare Other

## 2022-01-22 ENCOUNTER — Telehealth: Payer: Self-pay | Admitting: Cardiovascular Disease

## 2022-01-22 ENCOUNTER — Ambulatory Visit (HOSPITAL_COMMUNITY): Payer: Medicare Other

## 2022-01-22 ENCOUNTER — Inpatient Hospital Stay (HOSPITAL_COMMUNITY)
Admission: EM | Admit: 2022-01-22 | Discharge: 2022-01-25 | DRG: 065 | Disposition: A | Discharge status: Home / Self Care | Payer: Medicare Other | Attending: Internal Medicine | Admitting: Internal Medicine

## 2022-01-22 ENCOUNTER — Other Ambulatory Visit: Payer: Self-pay

## 2022-01-22 DIAGNOSIS — I5042 Chronic combined systolic (congestive) and diastolic (congestive) heart failure: Secondary | ICD-10-CM | POA: Diagnosis not present

## 2022-01-22 DIAGNOSIS — Z8249 Family history of ischemic heart disease and other diseases of the circulatory system: Secondary | ICD-10-CM | POA: Diagnosis not present

## 2022-01-22 DIAGNOSIS — I739 Peripheral vascular disease, unspecified: Secondary | ICD-10-CM | POA: Diagnosis present

## 2022-01-22 DIAGNOSIS — I639 Cerebral infarction, unspecified: Secondary | ICD-10-CM | POA: Diagnosis not present

## 2022-01-22 DIAGNOSIS — N184 Chronic kidney disease, stage 4 (severe): Secondary | ICD-10-CM | POA: Diagnosis present

## 2022-01-22 DIAGNOSIS — N1832 Chronic kidney disease, stage 3b: Secondary | ICD-10-CM | POA: Diagnosis not present

## 2022-01-22 DIAGNOSIS — R29700 NIHSS score 0: Secondary | ICD-10-CM | POA: Diagnosis not present

## 2022-01-22 DIAGNOSIS — I350 Nonrheumatic aortic (valve) stenosis: Secondary | ICD-10-CM | POA: Diagnosis not present

## 2022-01-22 DIAGNOSIS — E782 Mixed hyperlipidemia: Secondary | ICD-10-CM | POA: Diagnosis present

## 2022-01-22 DIAGNOSIS — Z96653 Presence of artificial knee joint, bilateral: Secondary | ICD-10-CM | POA: Diagnosis not present

## 2022-01-22 DIAGNOSIS — I130 Hypertensive heart and chronic kidney disease with heart failure and stage 1 through stage 4 chronic kidney disease, or unspecified chronic kidney disease: Secondary | ICD-10-CM | POA: Diagnosis present

## 2022-01-22 DIAGNOSIS — R29818 Other symptoms and signs involving the nervous system: Secondary | ICD-10-CM | POA: Diagnosis not present

## 2022-01-22 DIAGNOSIS — Z20822 Contact with and (suspected) exposure to covid-19: Secondary | ICD-10-CM | POA: Diagnosis present

## 2022-01-22 DIAGNOSIS — Z7901 Long term (current) use of anticoagulants: Secondary | ICD-10-CM

## 2022-01-22 DIAGNOSIS — G319 Degenerative disease of nervous system, unspecified: Secondary | ICD-10-CM | POA: Diagnosis not present

## 2022-01-22 DIAGNOSIS — I63413 Cerebral infarction due to embolism of bilateral middle cerebral arteries: Secondary | ICD-10-CM | POA: Diagnosis not present

## 2022-01-22 DIAGNOSIS — Z87442 Personal history of urinary calculi: Secondary | ICD-10-CM

## 2022-01-22 DIAGNOSIS — G9389 Other specified disorders of brain: Secondary | ICD-10-CM | POA: Diagnosis not present

## 2022-01-22 DIAGNOSIS — I480 Paroxysmal atrial fibrillation: Secondary | ICD-10-CM | POA: Diagnosis not present

## 2022-01-22 DIAGNOSIS — K219 Gastro-esophageal reflux disease without esophagitis: Secondary | ICD-10-CM | POA: Diagnosis present

## 2022-01-22 DIAGNOSIS — I10 Essential (primary) hypertension: Secondary | ICD-10-CM | POA: Diagnosis present

## 2022-01-22 DIAGNOSIS — Z888 Allergy status to other drugs, medicaments and biological substances status: Secondary | ICD-10-CM | POA: Diagnosis not present

## 2022-01-22 DIAGNOSIS — I6782 Cerebral ischemia: Secondary | ICD-10-CM | POA: Diagnosis not present

## 2022-01-22 DIAGNOSIS — G8321 Monoplegia of upper limb affecting right dominant side: Secondary | ICD-10-CM | POA: Diagnosis present

## 2022-01-22 DIAGNOSIS — N179 Acute kidney failure, unspecified: Secondary | ICD-10-CM | POA: Diagnosis not present

## 2022-01-22 DIAGNOSIS — Z79899 Other long term (current) drug therapy: Secondary | ICD-10-CM

## 2022-01-22 DIAGNOSIS — Z951 Presence of aortocoronary bypass graft: Secondary | ICD-10-CM | POA: Diagnosis not present

## 2022-01-22 DIAGNOSIS — Z955 Presence of coronary angioplasty implant and graft: Secondary | ICD-10-CM | POA: Diagnosis not present

## 2022-01-22 DIAGNOSIS — R531 Weakness: Secondary | ICD-10-CM | POA: Diagnosis not present

## 2022-01-22 DIAGNOSIS — I2510 Atherosclerotic heart disease of native coronary artery without angina pectoris: Secondary | ICD-10-CM | POA: Diagnosis not present

## 2022-01-22 DIAGNOSIS — M5412 Radiculopathy, cervical region: Secondary | ICD-10-CM | POA: Diagnosis not present

## 2022-01-22 DIAGNOSIS — I959 Hypotension, unspecified: Secondary | ICD-10-CM | POA: Diagnosis not present

## 2022-01-22 DIAGNOSIS — Z7984 Long term (current) use of oral hypoglycemic drugs: Secondary | ICD-10-CM

## 2022-01-22 DIAGNOSIS — E119 Type 2 diabetes mellitus without complications: Secondary | ICD-10-CM | POA: Diagnosis present

## 2022-01-22 DIAGNOSIS — J3489 Other specified disorders of nose and nasal sinuses: Secondary | ICD-10-CM | POA: Diagnosis not present

## 2022-01-22 DIAGNOSIS — Z87891 Personal history of nicotine dependence: Secondary | ICD-10-CM

## 2022-01-22 DIAGNOSIS — Z7982 Long term (current) use of aspirin: Secondary | ICD-10-CM

## 2022-01-22 LAB — CBC
HCT: 42.6 % (ref 39.0–52.0)
Hemoglobin: 13.7 g/dL (ref 13.0–17.0)
MCH: 29.7 pg (ref 26.0–34.0)
MCHC: 32.2 g/dL (ref 30.0–36.0)
MCV: 92.4 fL (ref 80.0–100.0)
Platelets: 254 10*3/uL (ref 150–400)
RBC: 4.61 MIL/uL (ref 4.22–5.81)
RDW: 14.3 % (ref 11.5–15.5)
WBC: 8.5 10*3/uL (ref 4.0–10.5)
nRBC: 0 % (ref 0.0–0.2)

## 2022-01-22 LAB — DIFFERENTIAL
Abs Immature Granulocytes: 0.05 10*3/uL (ref 0.00–0.07)
Basophils Absolute: 0 10*3/uL (ref 0.0–0.1)
Basophils Relative: 0 %
Eosinophils Absolute: 0 10*3/uL (ref 0.0–0.5)
Eosinophils Relative: 0 %
Immature Granulocytes: 1 %
Lymphocytes Relative: 18 %
Lymphs Abs: 1.5 10*3/uL (ref 0.7–4.0)
Monocytes Absolute: 0.8 10*3/uL (ref 0.1–1.0)
Monocytes Relative: 9 %
Neutro Abs: 6.1 10*3/uL (ref 1.7–7.7)
Neutrophils Relative %: 72 %

## 2022-01-22 LAB — PROTIME-INR
INR: 1.2 (ref 0.8–1.2)
Prothrombin Time: 15.4 seconds — ABNORMAL HIGH (ref 11.4–15.2)

## 2022-01-22 LAB — CBG MONITORING, ED: Glucose-Capillary: 108 mg/dL — ABNORMAL HIGH (ref 70–99)

## 2022-01-22 LAB — COMPREHENSIVE METABOLIC PANEL
ALT: 23 U/L (ref 0–44)
AST: 24 U/L (ref 15–41)
Albumin: 3.9 g/dL (ref 3.5–5.0)
Alkaline Phosphatase: 112 U/L (ref 38–126)
Anion gap: 10 (ref 5–15)
BUN: 30 mg/dL — ABNORMAL HIGH (ref 8–23)
CO2: 25 mmol/L (ref 22–32)
Calcium: 9.8 mg/dL (ref 8.9–10.3)
Chloride: 101 mmol/L (ref 98–111)
Creatinine, Ser: 2.1 mg/dL — ABNORMAL HIGH (ref 0.61–1.24)
GFR, Estimated: 32 mL/min — ABNORMAL LOW (ref >60)
Glucose, Bld: 106 mg/dL — ABNORMAL HIGH (ref 70–99)
Potassium: 4.5 mmol/L (ref 3.5–5.1)
Sodium: 136 mmol/L (ref 135–145)
Total Bilirubin: 0.5 mg/dL (ref 0.3–1.2)
Total Protein: 7.3 g/dL (ref 6.5–8.1)

## 2022-01-22 LAB — I-STAT CHEM 8, ED
BUN: 31 mg/dL — ABNORMAL HIGH (ref 8–23)
Calcium, Ion: 1.24 mmol/L (ref 1.15–1.40)
Chloride: 103 mmol/L (ref 98–111)
Creatinine, Ser: 2.3 mg/dL — ABNORMAL HIGH (ref 0.61–1.24)
Glucose, Bld: 100 mg/dL — ABNORMAL HIGH (ref 70–99)
HCT: 43 % (ref 39.0–52.0)
Hemoglobin: 14.6 g/dL (ref 13.0–17.0)
Potassium: 4.5 mmol/L (ref 3.5–5.1)
Sodium: 136 mmol/L (ref 135–145)
TCO2: 26 mmol/L (ref 22–32)

## 2022-01-22 LAB — APTT: aPTT: 33 seconds (ref 24–36)

## 2022-01-22 IMAGING — MR MR MRA HEAD W/O CM
2 series · 20 of 48 positions shown · non-contrast
Comparison: No pertinent prior exam.

CLINICAL DATA: Acute neurologic deficit

EXAM:
MRA HEAD WITHOUT CONTRAST
TECHNIQUE: Angiographic images of the Circle of Willis were acquired using MRA
technique without intravenous contrast.

[Series 2: ax (id) · axial · 1.0mm · 0.43mm/px · z∈[-90,+0]mm · 16 of 193 slices shown]
[im 1/193]
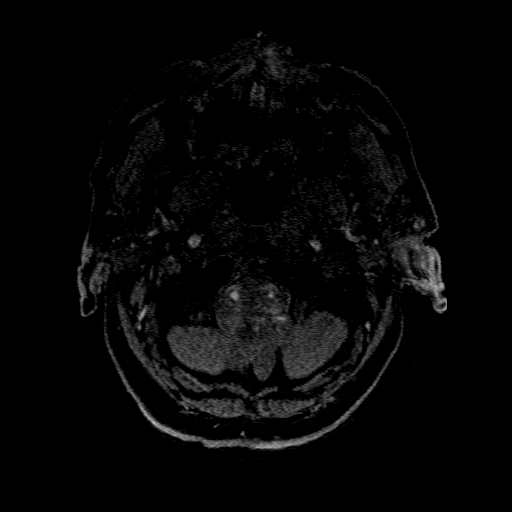
[im 5/193]
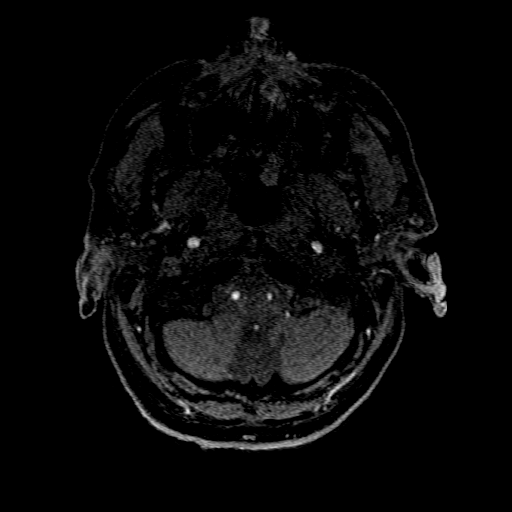
[im 9/193]
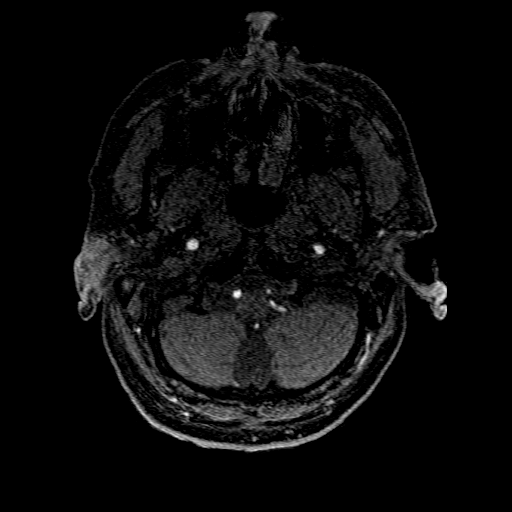
[im 14/193]
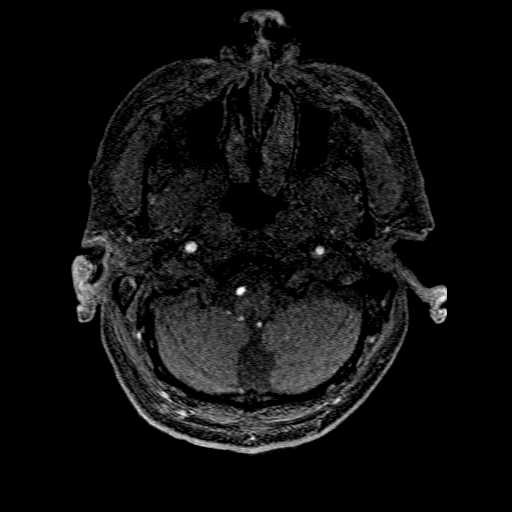
[im 18/193]
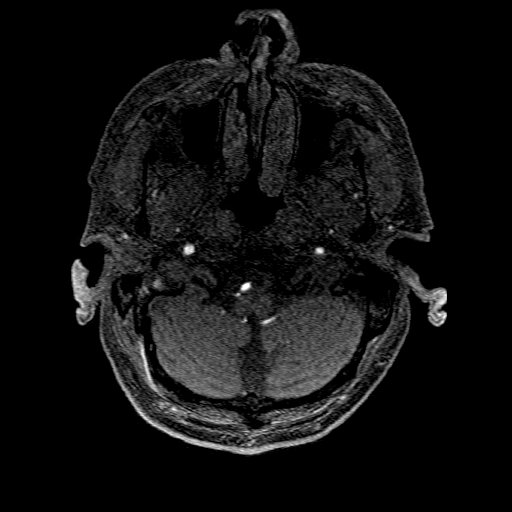
[im 23/193]
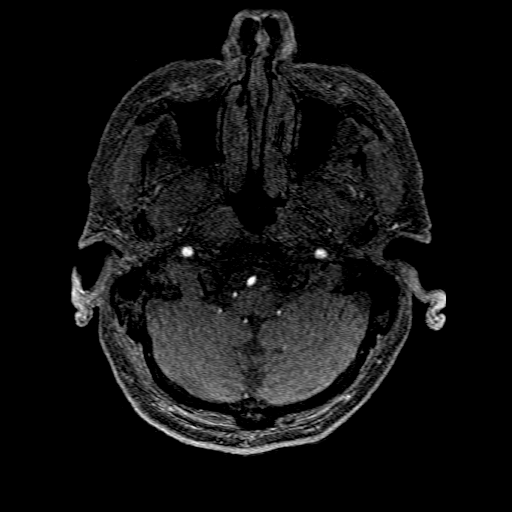
[im 32/193]
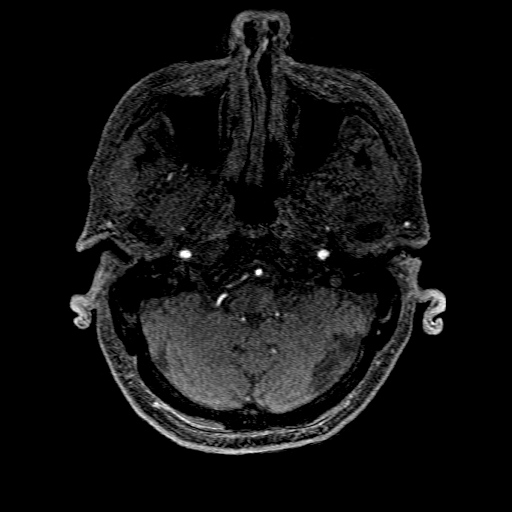
[im 36/193]
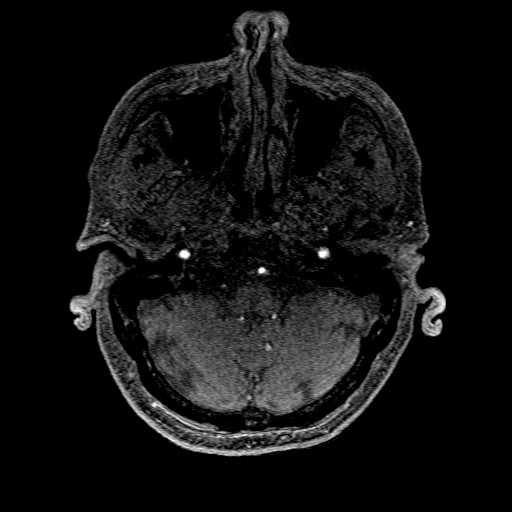
[im 59/193]
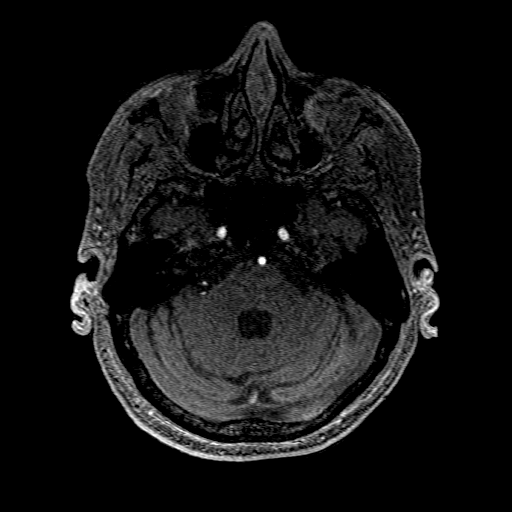
[im 85/193]
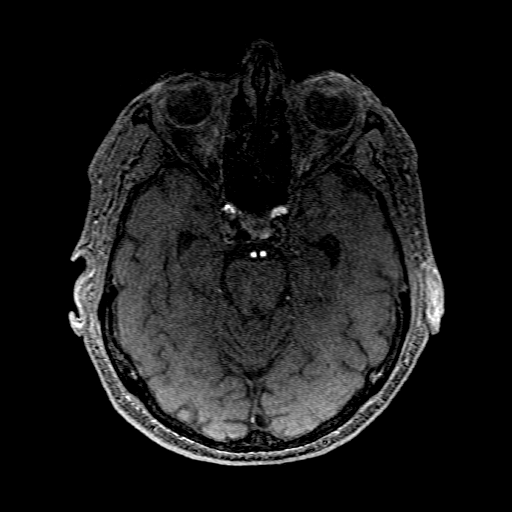
[im 99/193]
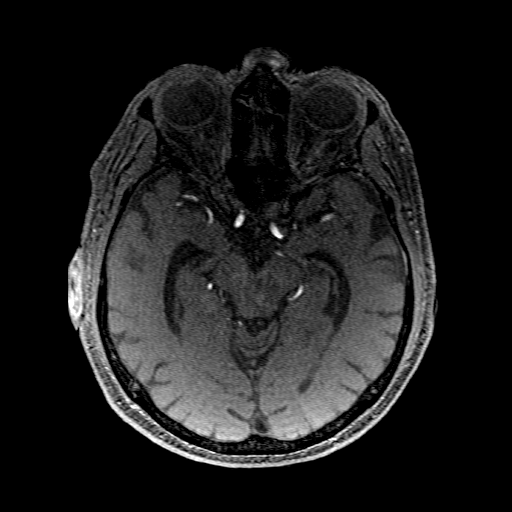
[im 108/193]
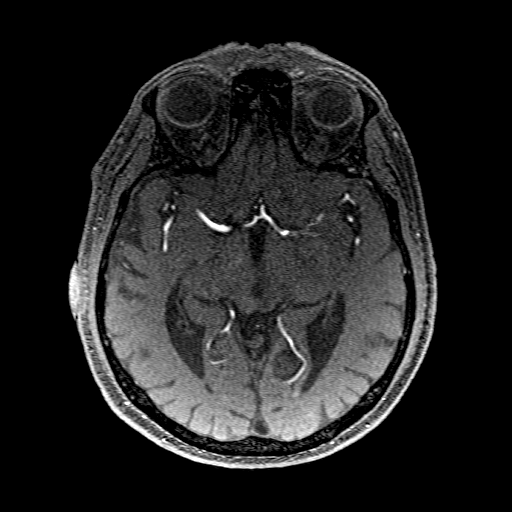
[im 134/193]
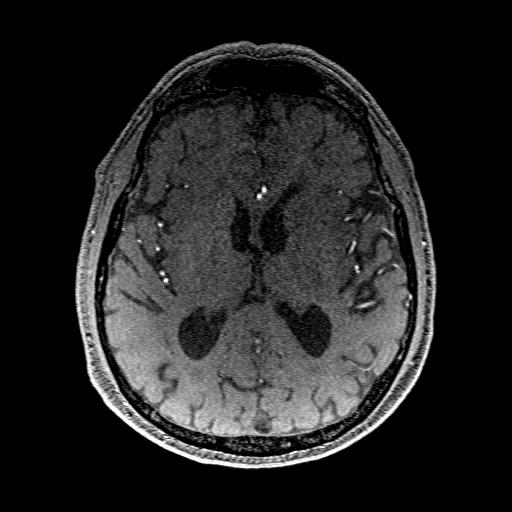
[im 157/193]
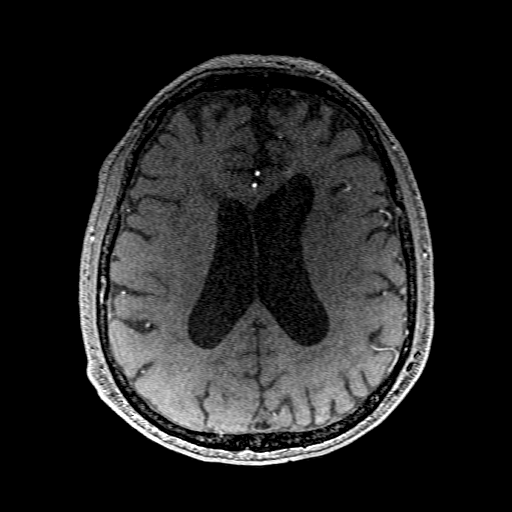
[im 161/193]
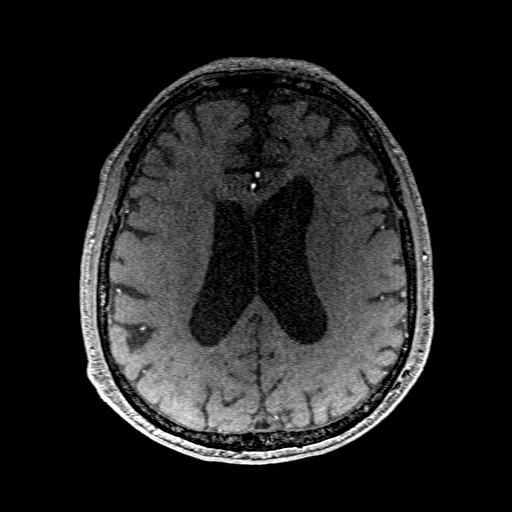
[im 184/193]
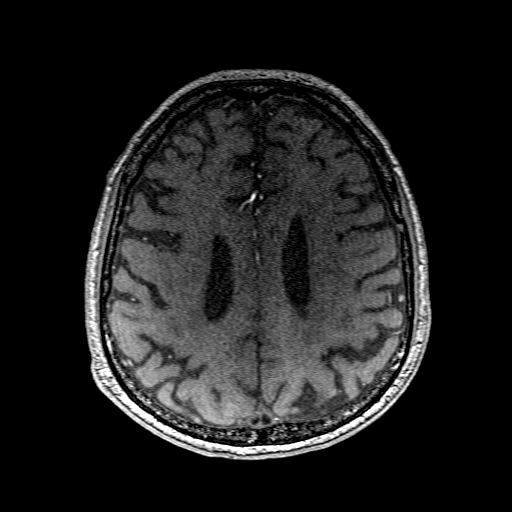

[Series 201: pjn:ax (id) · sagittal · 1.0mm · 0.43mm/px · 4 of 16 slices shown]
[im 1/16]
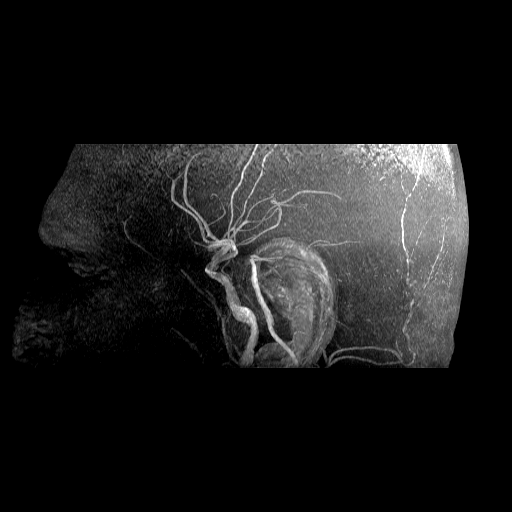
[im 6/16]
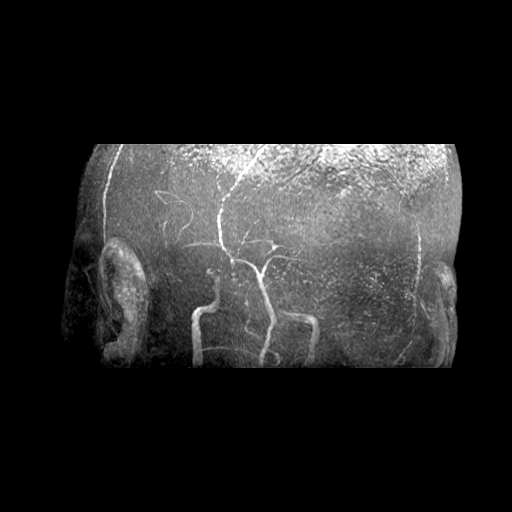
[im 11/16]
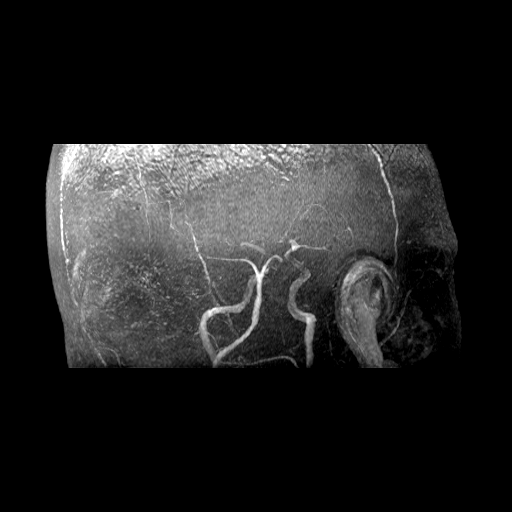
[im 16/16]
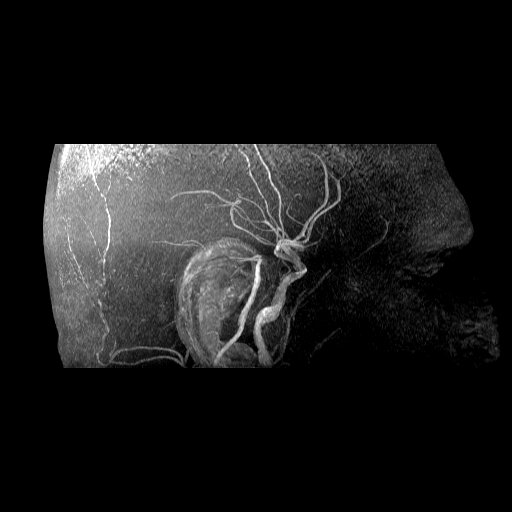

[20 of 48 positions shown; findings below may reference images not displayed]

FINDINGS: POSTERIOR CIRCULATION:

--Vertebral arteries: Loss of flow related enhancement in the distal
left V4 segment. Normal right.

--Inferior cerebellar arteries: Normal.

--Basilar artery: Normal.

--Superior cerebellar arteries: Normal.

--Posterior cerebral arteries: Normal.

ANTERIOR CIRCULATION:

--Intracranial internal carotid arteries: Normal.

--Anterior cerebral arteries (ACA): Normal.

--Middle cerebral arteries (MCA): Normal.

ANATOMIC VARIANTS: None
IMPRESSION: 1. Loss of flow related enhancement in the distal left vertebral
artery V4 segment, which may indicate occlusion or stenosis.
2. Otherwise normal intracranial MRA.

## 2022-01-22 IMAGING — MR MR MRA NECK W/O CM
1 of 3 series · 19 of 48 positions shown · non-contrast
Comparison: No pertinent prior exam.

CLINICAL DATA: Weakness

EXAM:
MRA NECK WITHOUT CONTRAST
TECHNIQUE: Angiographic images of the neck were acquired using MRA technique
without intravenous contrast. Carotid stenosis measurements (when
applicable) are obtained utilizing NASCET criteria, using the distal
internal carotid diameter as the denominator.

[Series 4: sag inhance (id) · sagittal · 1.2mm · 0.47mm/px · 19 of 360 slices shown]
[im 1/360]
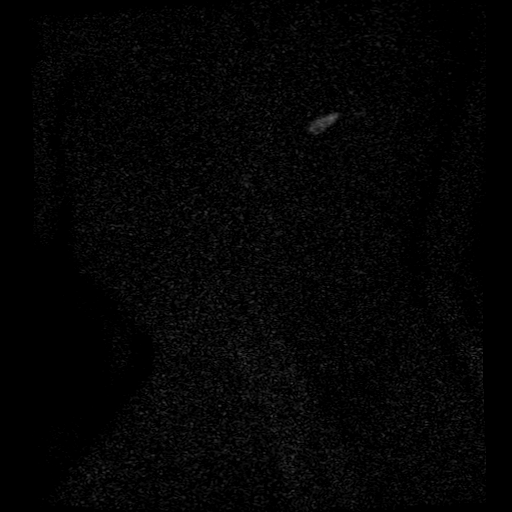
[im 12/360]
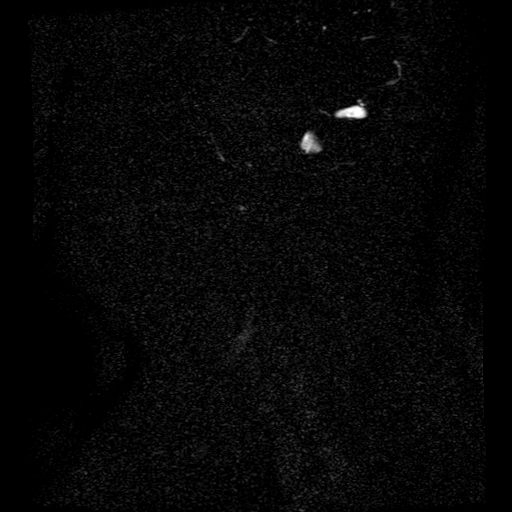
[im 23/360]
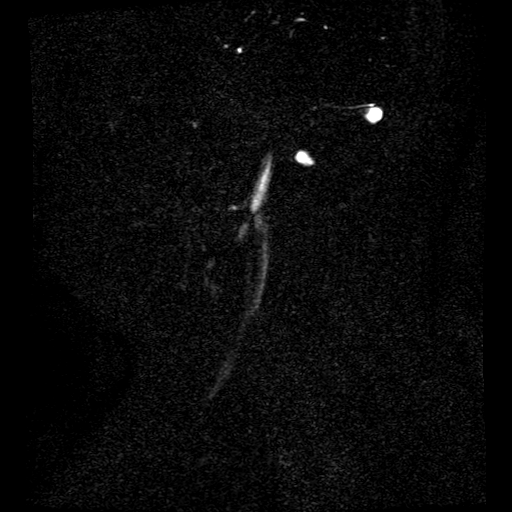
[im 34/360]
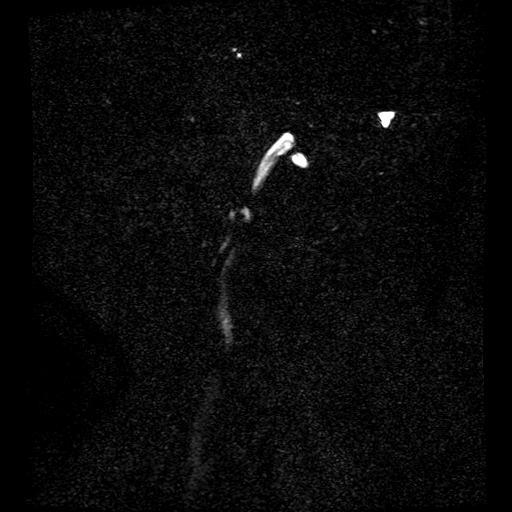
[im 45/360]
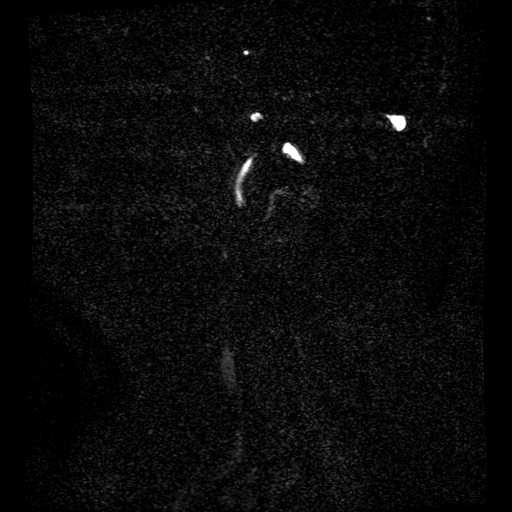
[im 57/360]
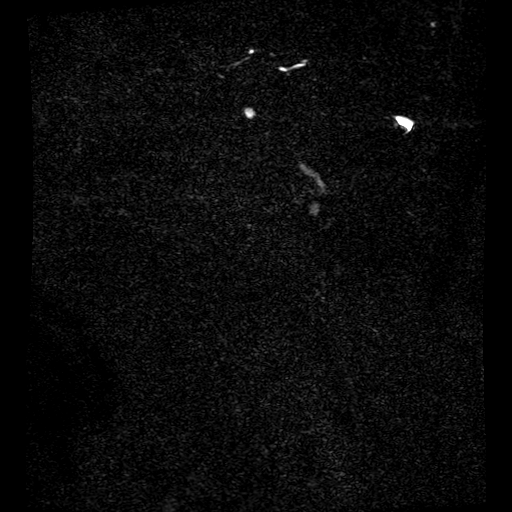
[im 68/360]
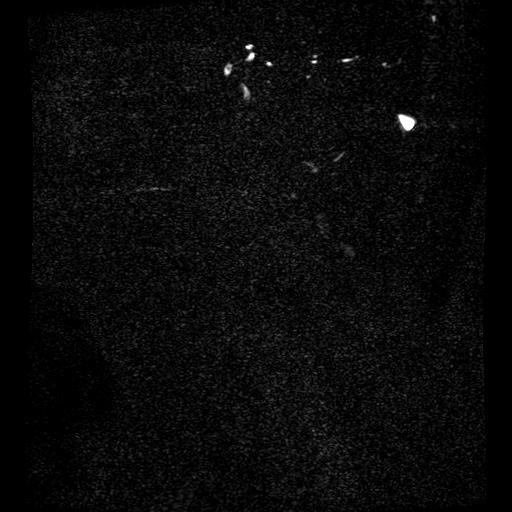
[im 79/360]
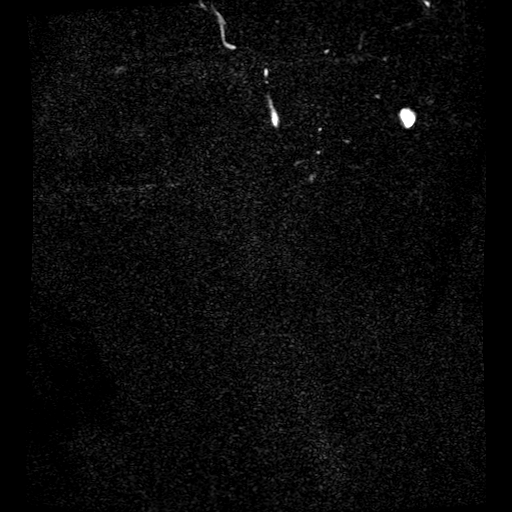
[im 90/360]
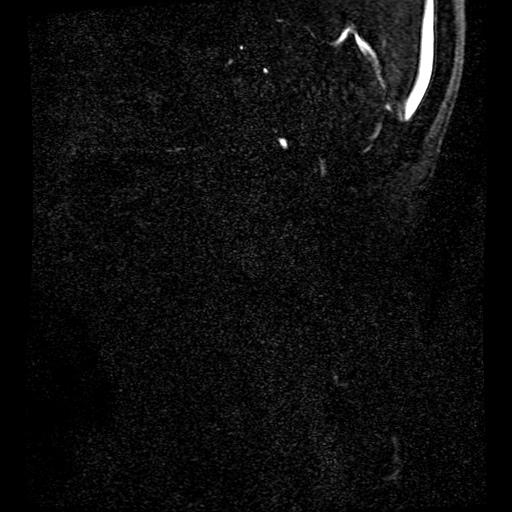
[im 101/360]
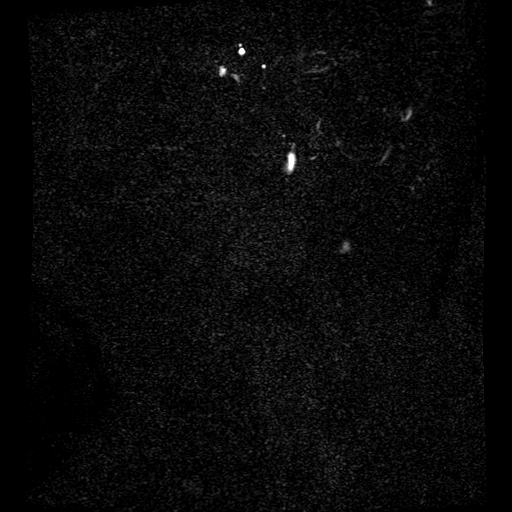
[im 113/360]
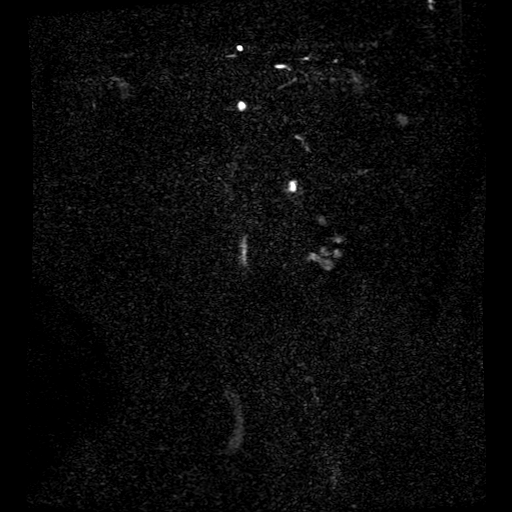
[im 124/360]
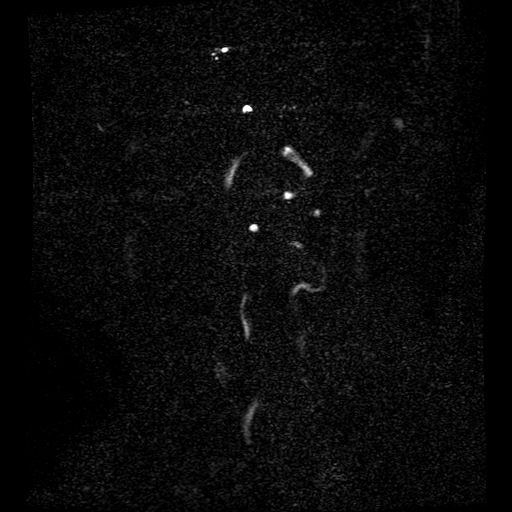
[im 158/360]
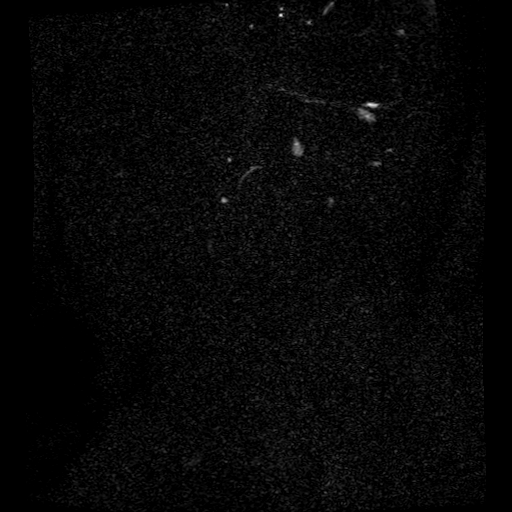
[im 180/360]
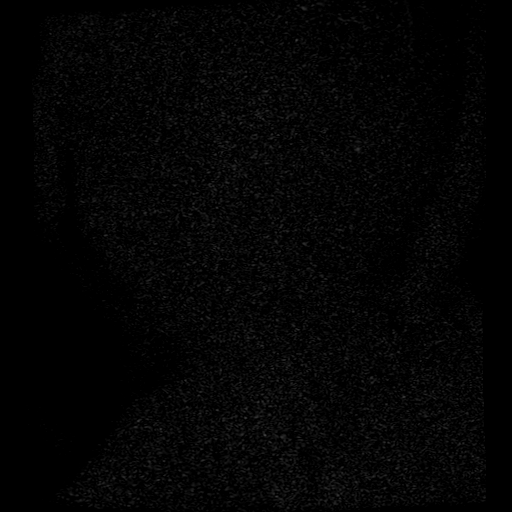
[im 202/360]
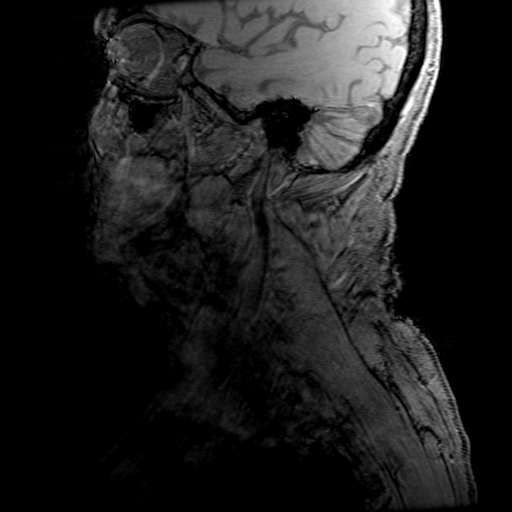
[im 247/360]
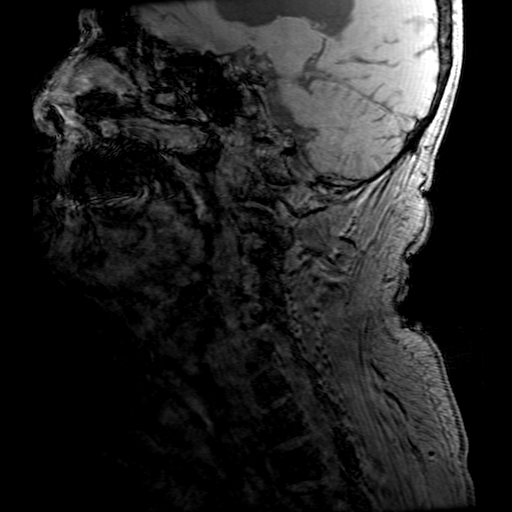
[im 292/360]
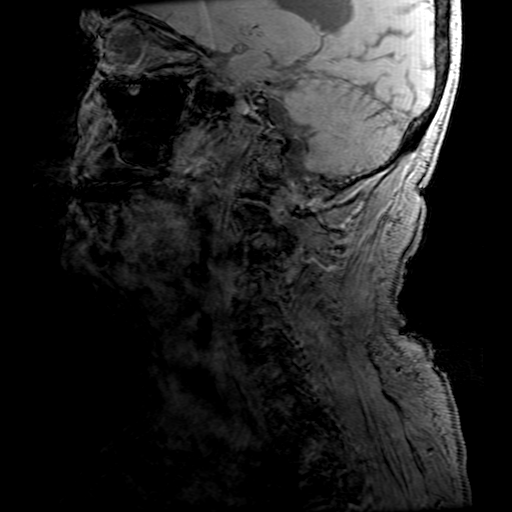
[im 303/360]
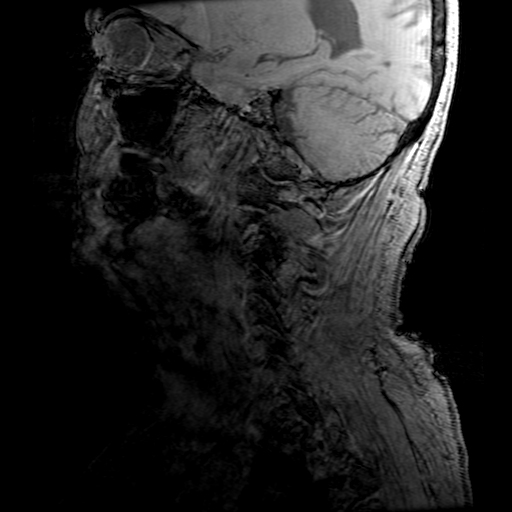
[im 337/360]
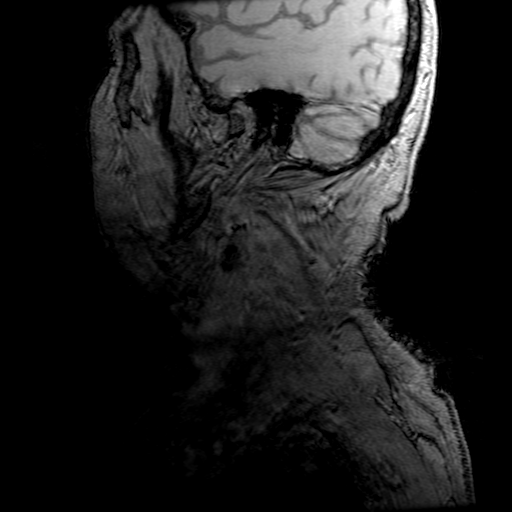

[19 of 48 positions shown; findings below may reference images not displayed]

FINDINGS: Aortic arch: Not visualized

Right carotid system: Loss of normal flow related enhancement within
the proximal right ICA. Distal right ICA is patent.

Left carotid system: No occlusion or stenosis.

Vertebral arteries: Right dominant. There is loss of normal flow
related enhancement within the left V4 segment. The right is 1

Other: None.
IMPRESSION: 1. Loss of normal flow related enhancement within the proximal right
ICA. Question prior ICA stent or other intervention. Otherwise, this
could represent severe stenosis or occlusion.
2. Severe stenosis or occlusion of the V4 segment of the left
vertebral artery.

## 2022-01-22 IMAGING — MR MR CERVICAL SPINE W/O CM
4 of 6 series · 19 of 48 positions shown · non-contrast
Comparison: None.

CLINICAL DATA: Cervical radiculopathy

EXAM:
MRI CERVICAL SPINE WITHOUT CONTRAST
TECHNIQUE: Multiplanar, multisequence MR imaging of the cervical spine was
performed. No intravenous contrast was administered.

[Series 3: T2 · sagittal · 3.0mm · 0.39mm/px · 3 of 18 slices shown (1 of 2)]
[im 1/18]
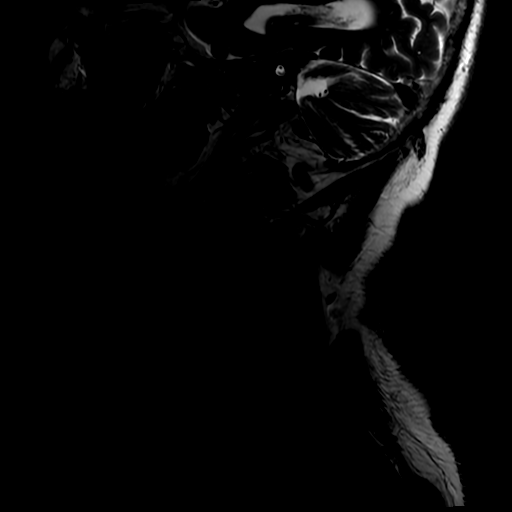
[im 9/18]
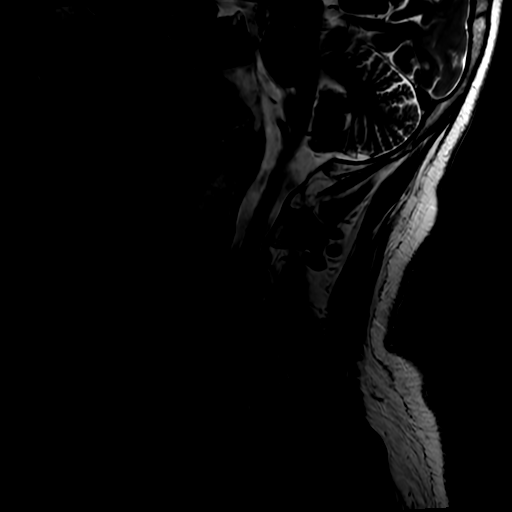
[im 18/18]
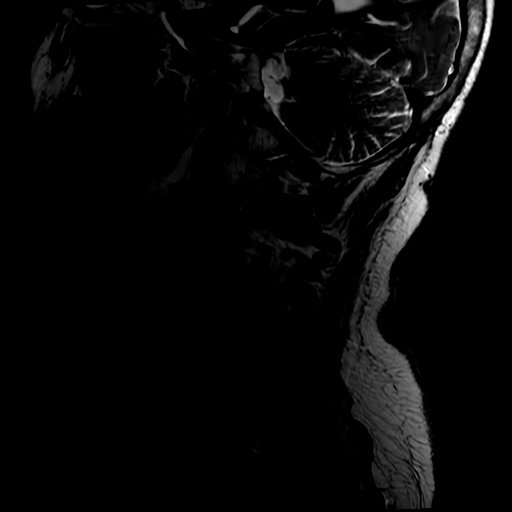

[Series 5: STIR · sagittal · 3.0mm · 0.39mm/px · 3 of 20 slices shown]
[im 1/20]
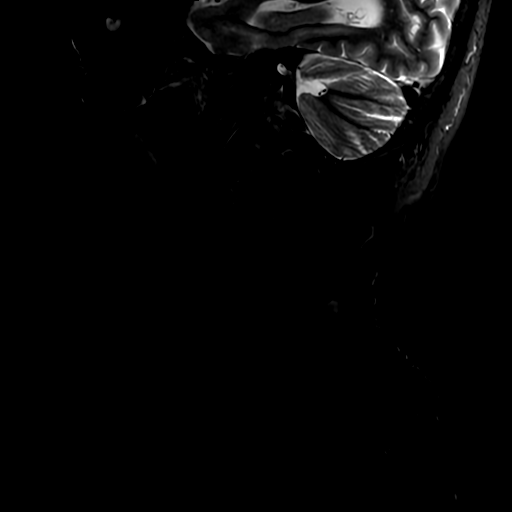
[im 13/20]
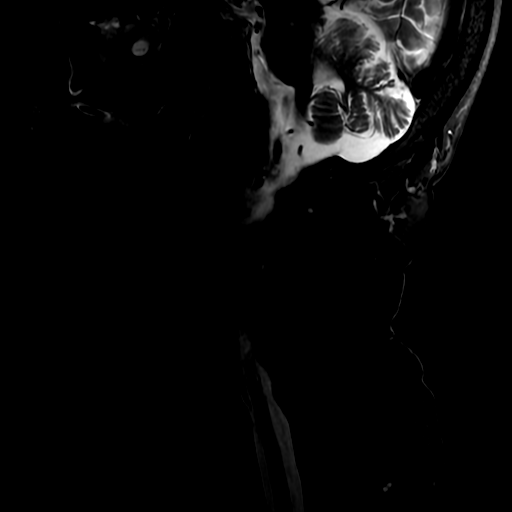
[im 20/20]
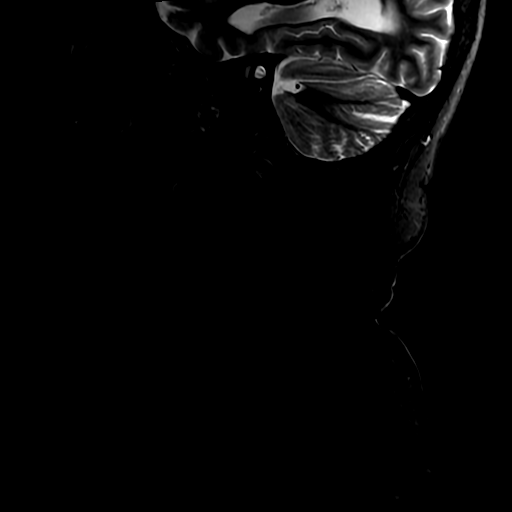

[Series 7: T2 · axial · 3.0mm · 0.35mm/px · z∈[-226,-101]mm · 7 of 39 slices shown (2 of 2)]
[im 1/39]
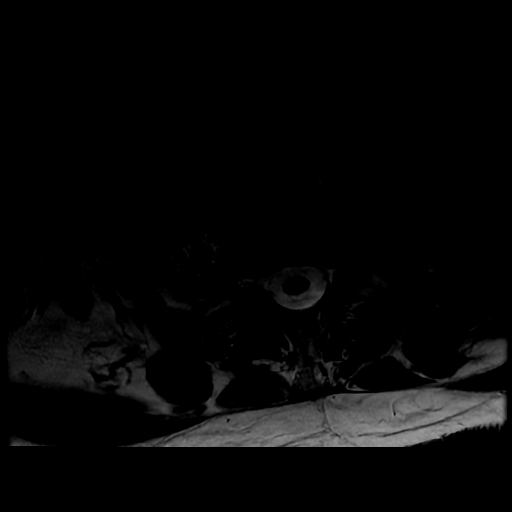
[im 7/39]
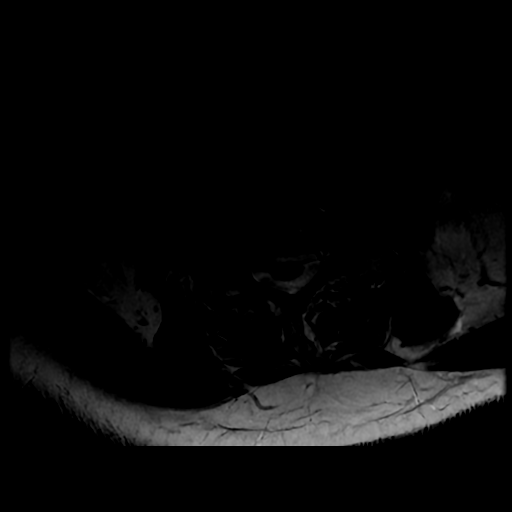
[im 13/39]
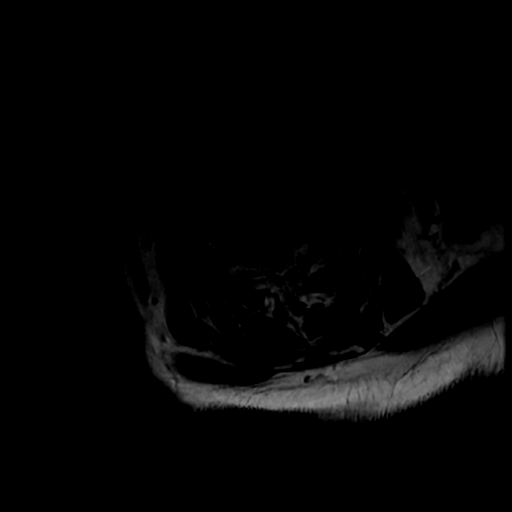
[im 20/39]
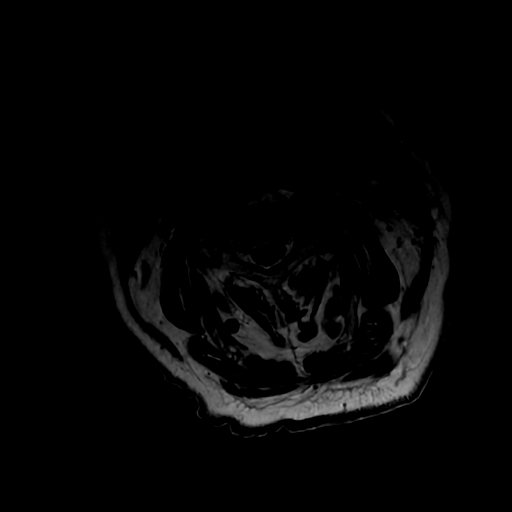
[im 26/39]
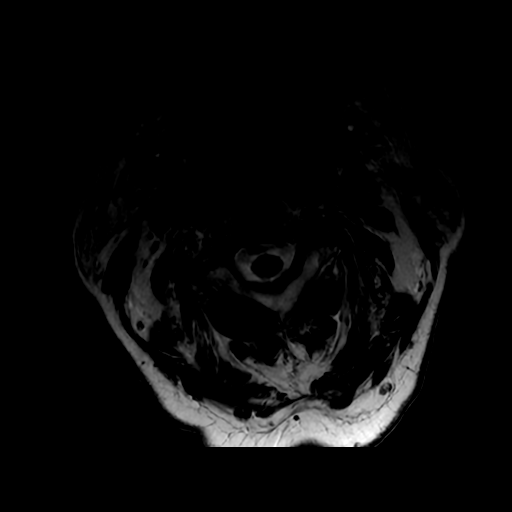
[im 32/39]
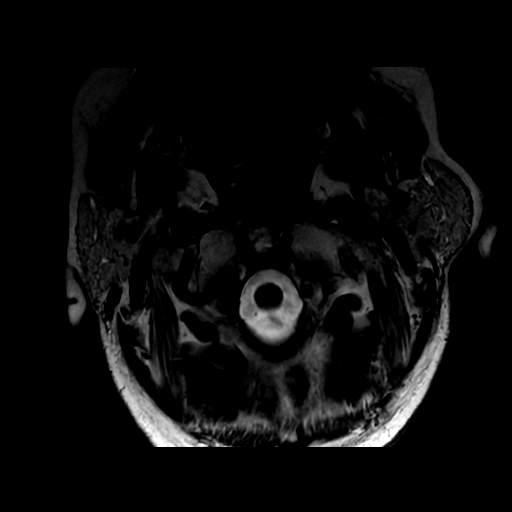
[im 39/39]
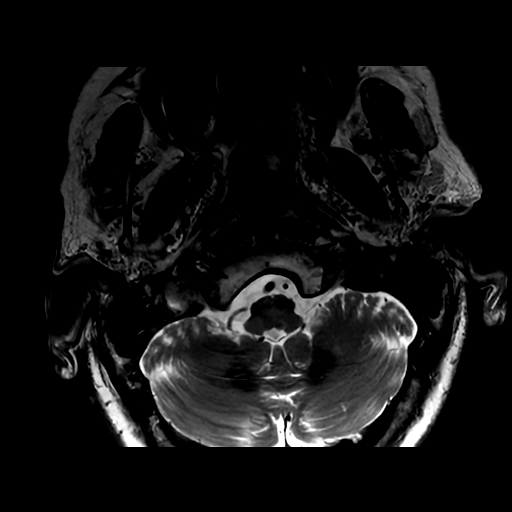

[Series 8: T1 · axial · non-contrast · 3.0mm · 0.35mm/px · z∈[-226,-120]mm · 6 of 40 slices shown]
[im 1/40]
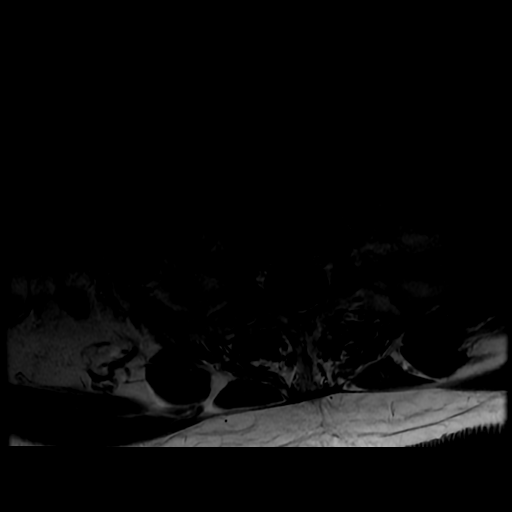
[im 6/40]
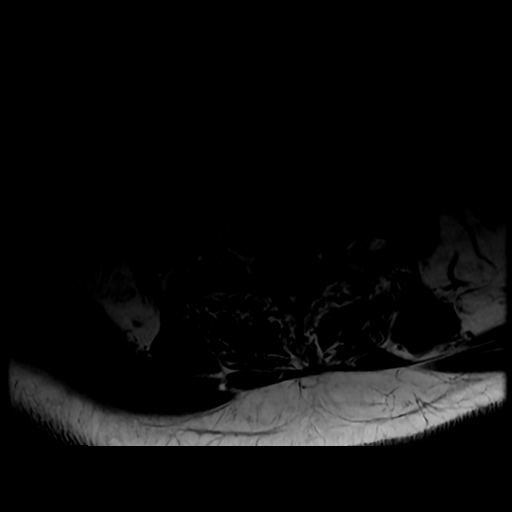
[im 12/40]
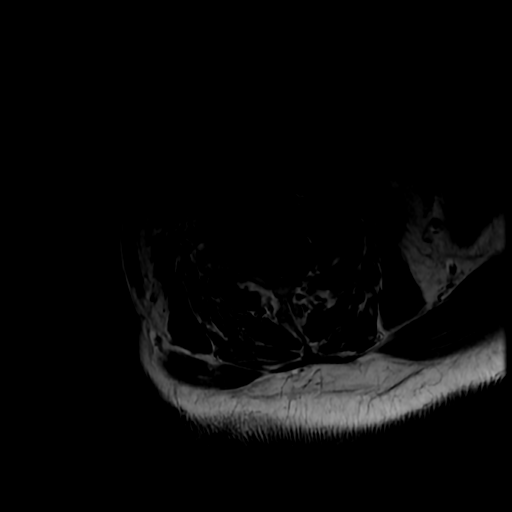
[im 17/40]
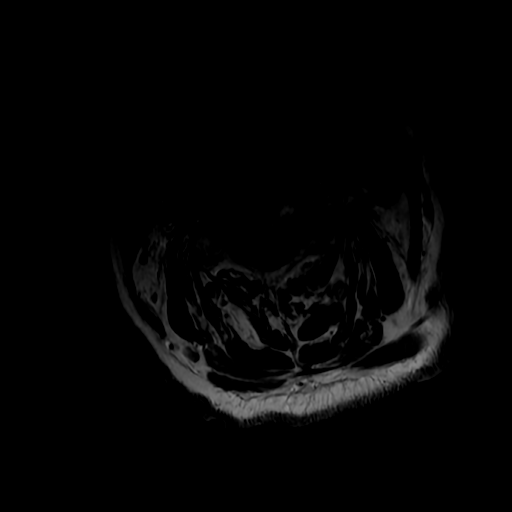
[im 23/40]
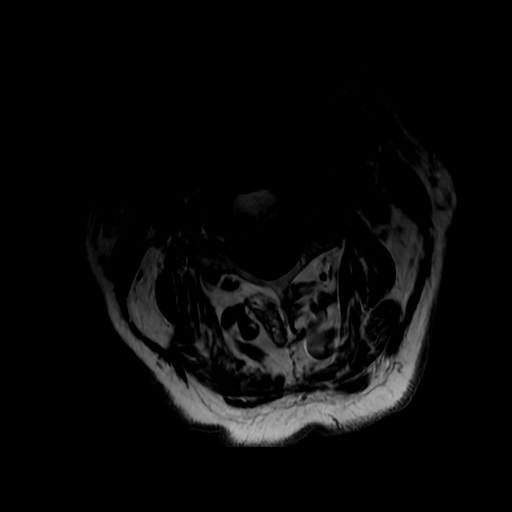
[im 34/40]
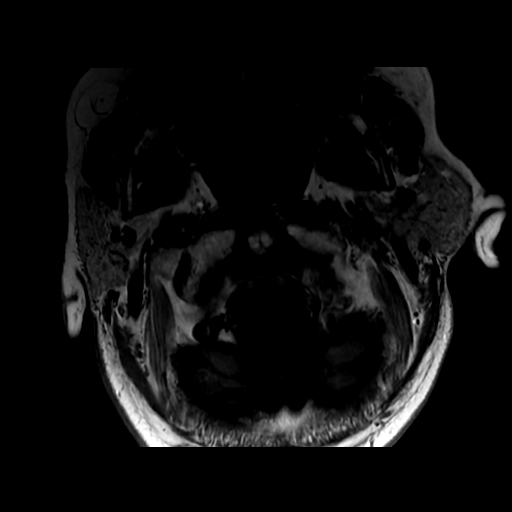

[19 of 48 positions shown; findings below may reference images not displayed]

FINDINGS: Alignment: Physiologic.

Vertebrae: No fracture, evidence of discitis, or bone lesion.

Cord: Normal signal and morphology.

Posterior Fossa, vertebral arteries, paraspinal tissues: Loss of
normal flow void at the left V4 segment.

Disc levels:

C1-2: Unremarkable.

C2-3: Small disc bulge with endplate spurring. There is no spinal
canal stenosis. Mild left neural foraminal stenosis.

C3-4: Small disc bulge with uncovertebral hypertrophy. There is no
spinal canal stenosis. Mild right and moderate left neural foraminal
stenosis.

C4-5: Small disc bulge with endplate spurring. There is no spinal
canal stenosis. Moderate bilateral neural foraminal stenosis.

C5-6: Small disc bulge with right uncovertebral hypertrophy. There
is no spinal canal stenosis. Severe right neural foraminal stenosis.

C6-7: Small right asymmetric disc bulge. There is no spinal canal
stenosis. Mild right neural foraminal stenosis.

C7-T1: Normal disc space and facet joints. There is no spinal canal
stenosis. No neural foraminal stenosis.
IMPRESSION: 1. Multilevel cervical degenerative disc disease without spinal
canal stenosis.
2. Severe right C5-6 neural foraminal stenosis.
3. Moderate bilateral C4-5 and left C3-4 neural foraminal stenosis.
4. Loss of normal flow void at the left V4 segment may be due to
slow flow or occlusion.

## 2022-01-22 IMAGING — CT CT HEAD W/O CM
4 series · 16 of 47 positions shown, 18 images · non-contrast
Comparison: CT head [DATE].

CLINICAL DATA: Neuro deficit, acute, stroke suspected



[Series 3: head without · axial · non-contrast · 0.44mm/px · z∈[-48,+82]mm · 7 of 36 slices shown, 9 images]
[im 5/36  brain]
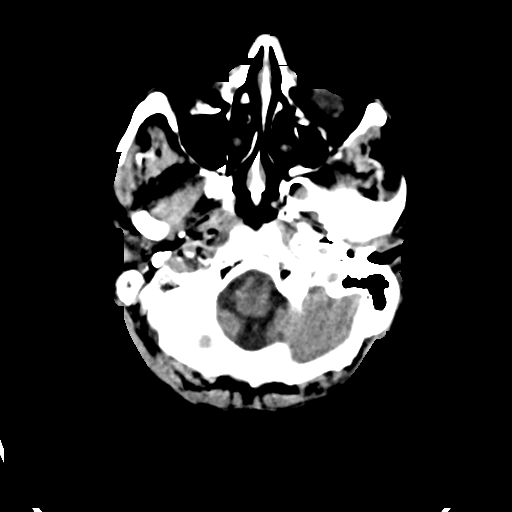
[im 5/36  bone]
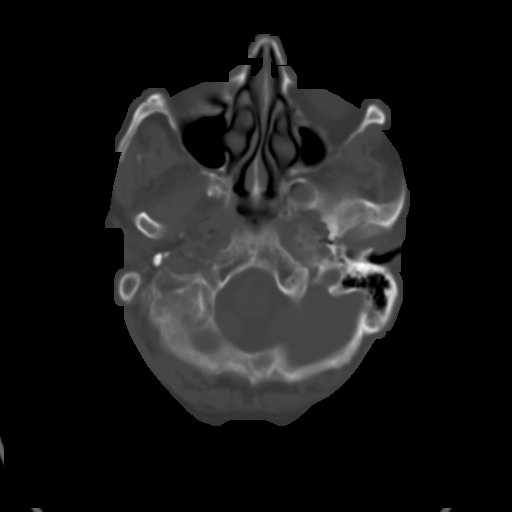
[im 9/36  brain]
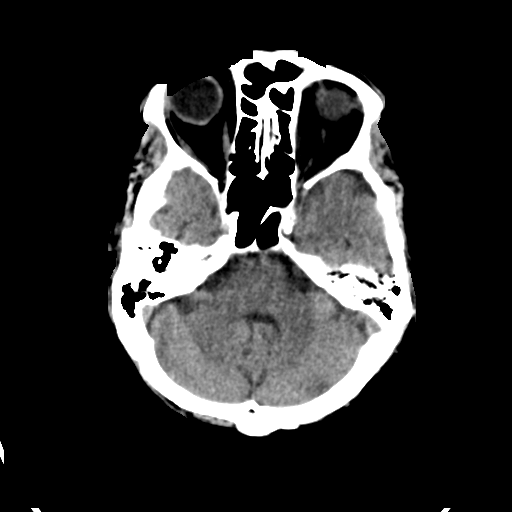
[im 14/36  brain]
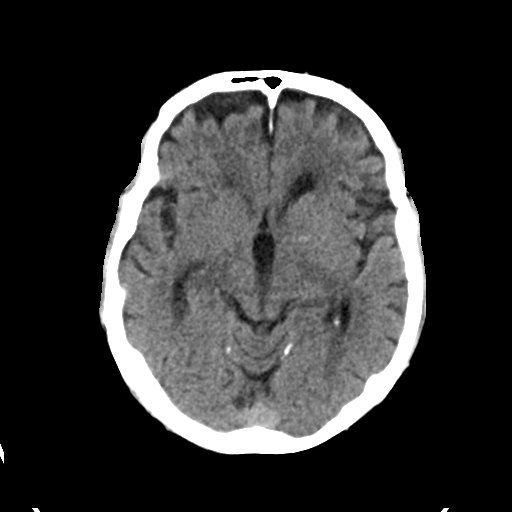
[im 18/36  brain]
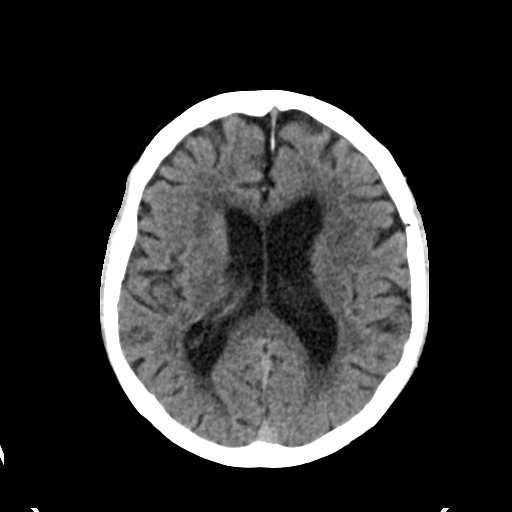
[im 22/36  brain]
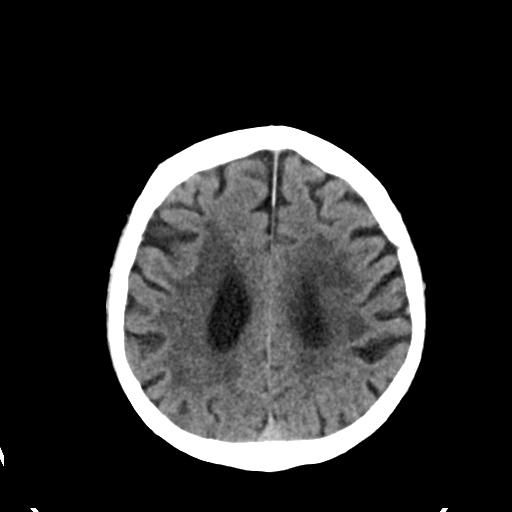
[im 22/36  bone]
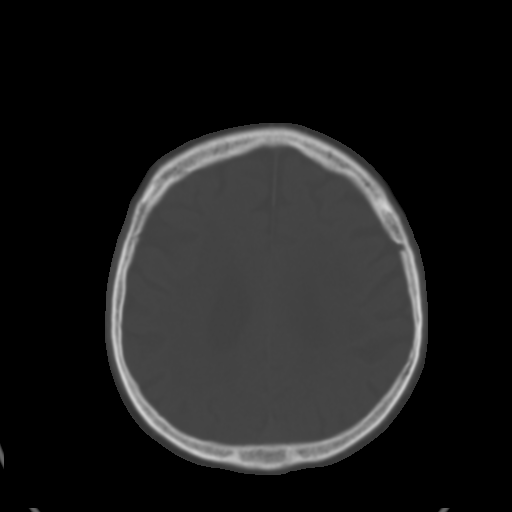
[im 27/36  brain]
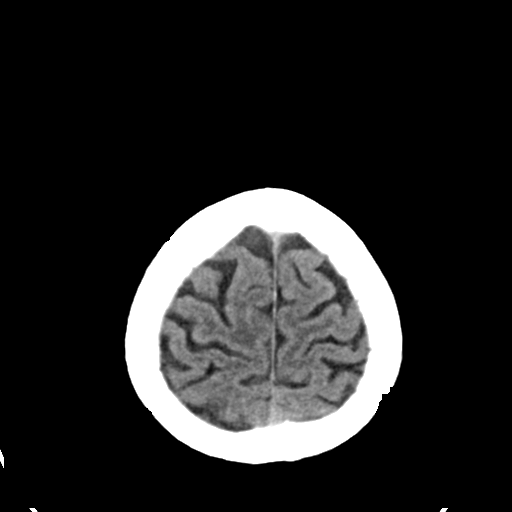
[im 31/36  brain]
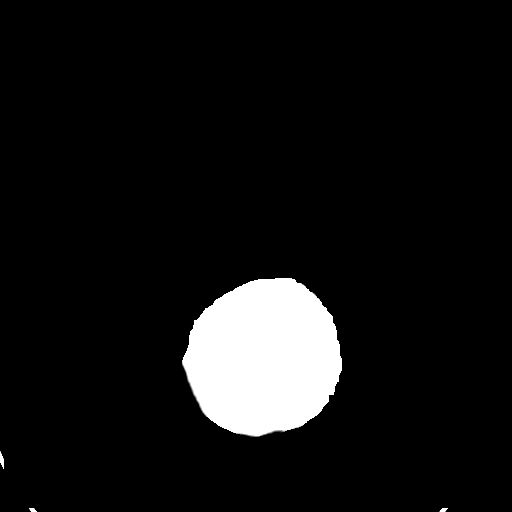

[Series 4: head bone · axial · 0.44mm/px · z∈[-52,-16]mm · 3 of 89 slices shown]
[im 9/89  bone]
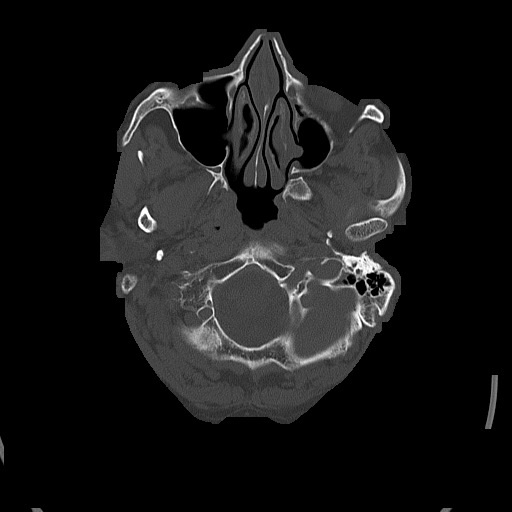
[im 18/89  bone]
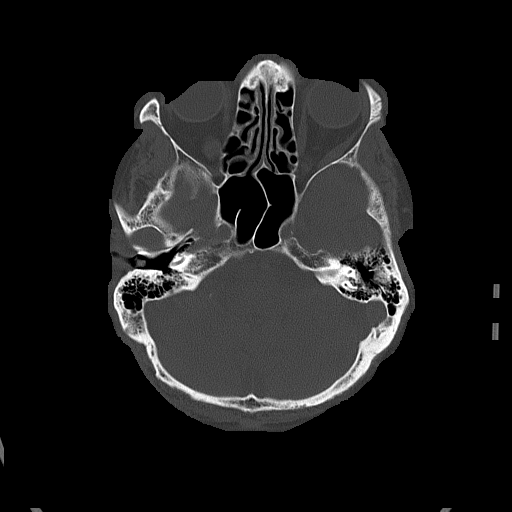
[im 27/89  bone]
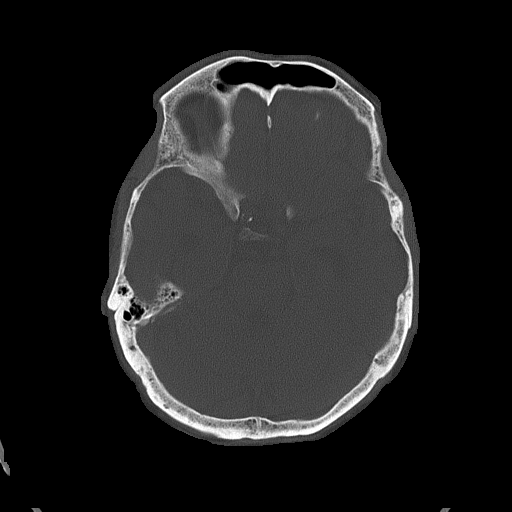

[Series 5: head without cor · coronal · non-contrast · 0.31mm/px · 3 of 67 slices shown]
[im 23/67  brain]
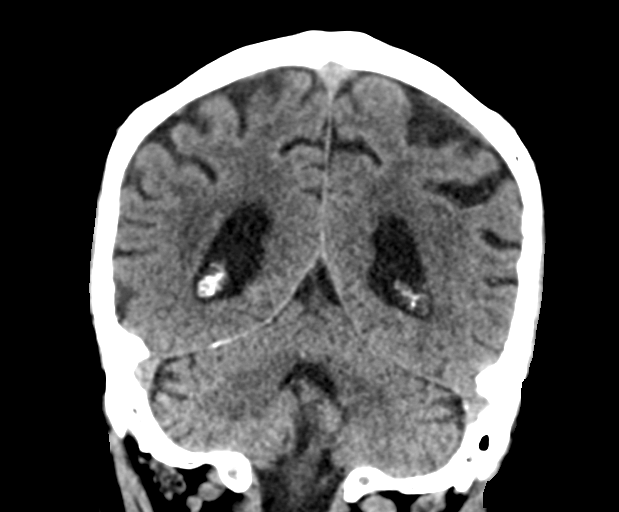
[im 30/67  brain]
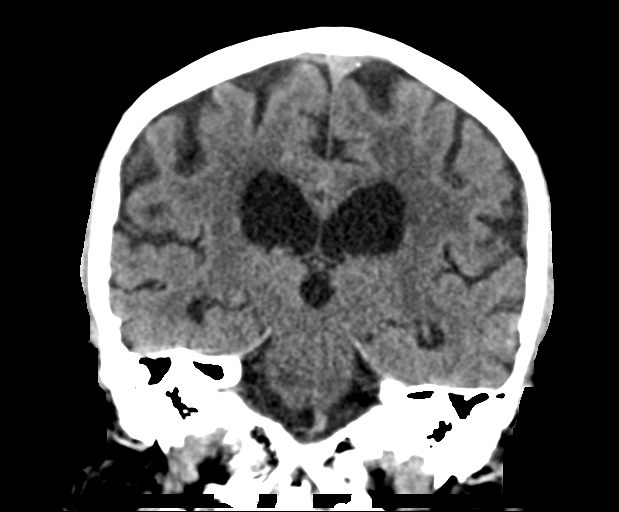
[im 37/67  brain]
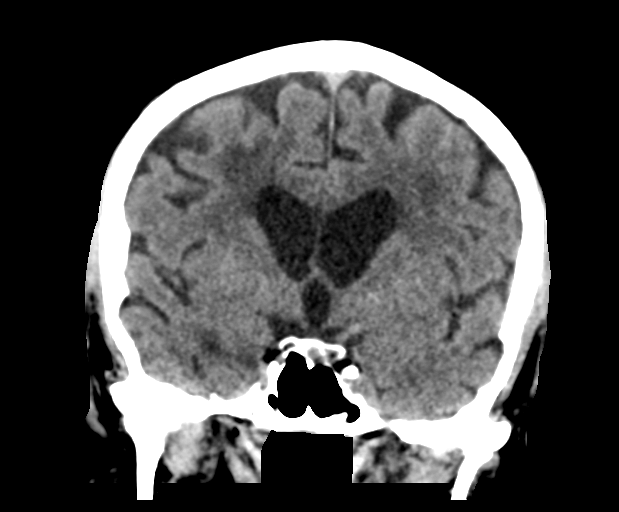

[Series 6: head without sag · sagittal · non-contrast · 0.34mm/px · 3 of 59 slices shown]
[im 20/59  brain]
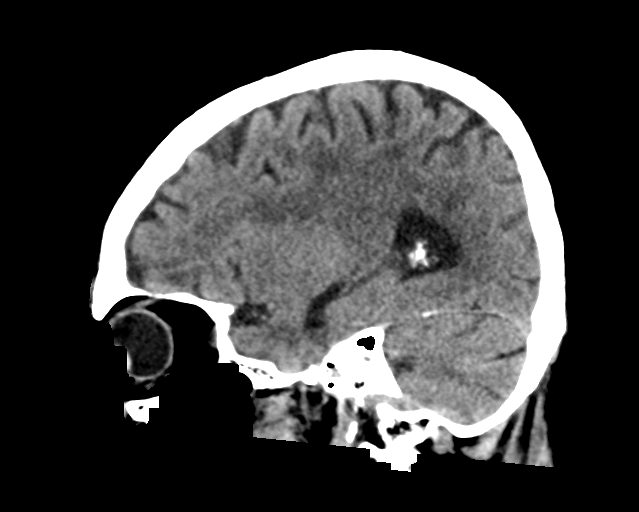
[im 30/59  brain]
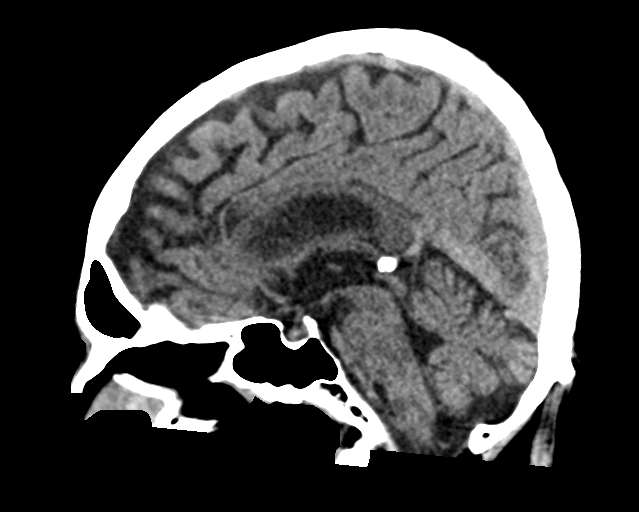
[im 39/59  brain]
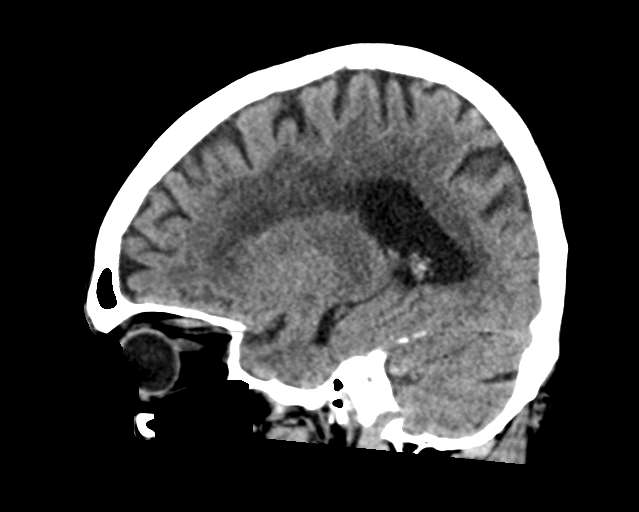

[16 of 47 positions shown; findings below may reference images not displayed]

FINDINGS: Brain: No evidence of acute large vascular territory infarction,
hemorrhage, hydrocephalus, extra-axial collection or mass
lesion/mass effect. Similar moderate patchy white matter
hypoattenuation, nonspecific but compatible with chronic
microvascular ischemic disease. Atrophy with ex vacuo ventricular
dilation.

Vascular: No hyperdense vessel identified.

Skull: No acute fracture.

Sinuses/Orbits: Mild paranasal sinus mucosal thickening.
Unremarkable orbits.

Other: No mastoid effusions.
IMPRESSION: 1. No evidence of acute intracranial abnormality.
2. Chronic microvascular ischemic disease and cerebral atrophy
([5B]-[5B]).

## 2022-01-22 IMAGING — MR MR HEAD W/O CM
6 of 11 series · 24 of 48 positions shown · non-contrast
Comparison: None.

CLINICAL DATA: Transient ischemic attack

EXAM:
MRI HEAD WITHOUT CONTRAST
TECHNIQUE: Multiplanar, multiecho pulse sequences of the brain and surrounding
structures were obtained without intravenous contrast.

[Series 2: DWI · axial · 3.0mm · 0.94mm/px · z∈[-103,+50]mm · 7 of 106 slices shown (1 of 2)]
[im 1/106]
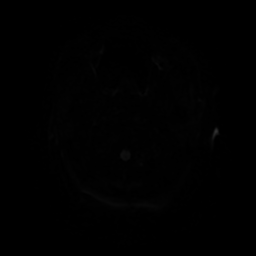
[im 18/106]
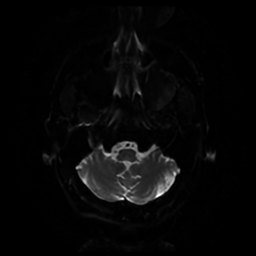
[im 36/106]
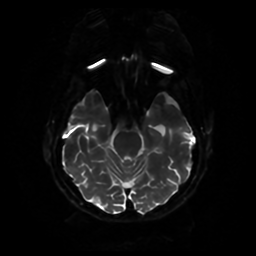
[im 53/106]
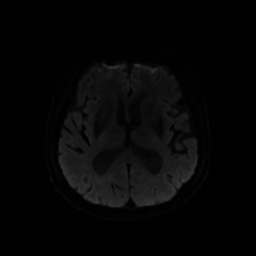
[im 71/106]
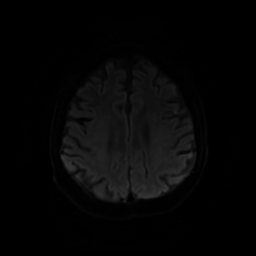
[im 88/106]
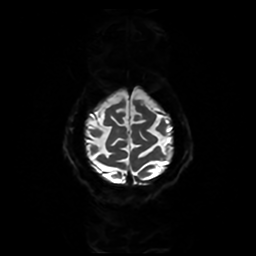
[im 106/106]
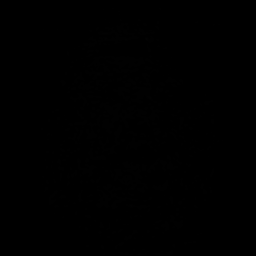

[Series 3: DWI · coronal · 4.0mm · 0.94mm/px · 5 of 68 slices shown (2 of 2)]
[im 1/68]
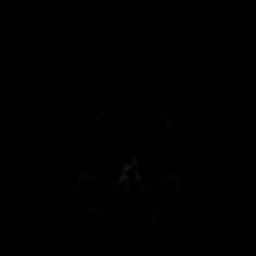
[im 17/68]
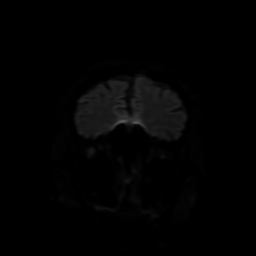
[im 34/68]
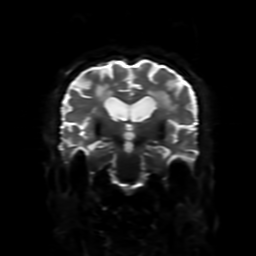
[im 51/68]
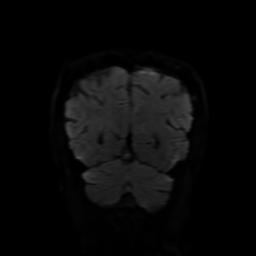
[im 68/68]
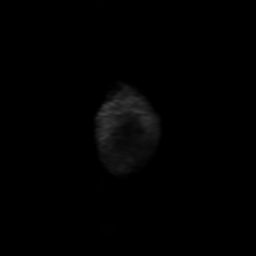

[Series 4: FLAIR · sagittal · 5.0mm · 0.23mm/px · 2 of 27 slices shown (1 of 2)]
[im 1/27]
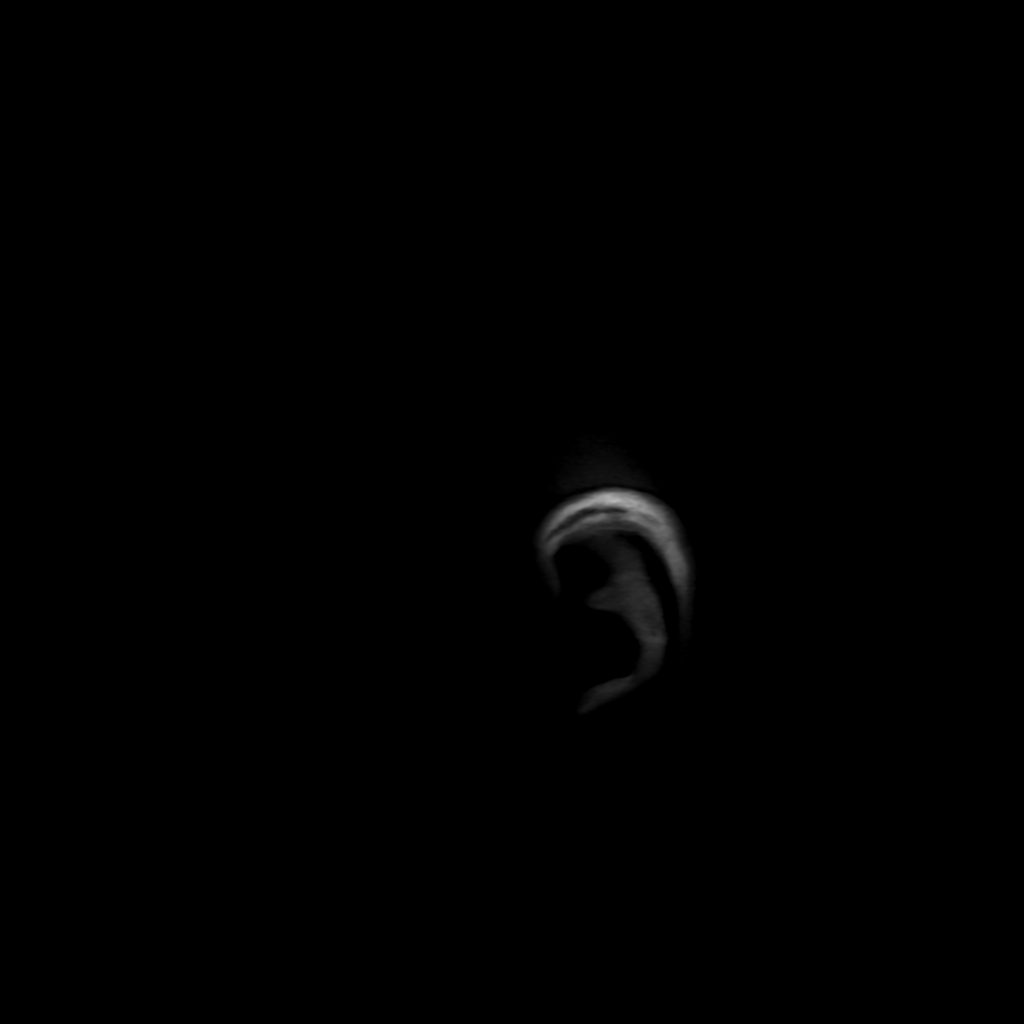
[im 27/27]
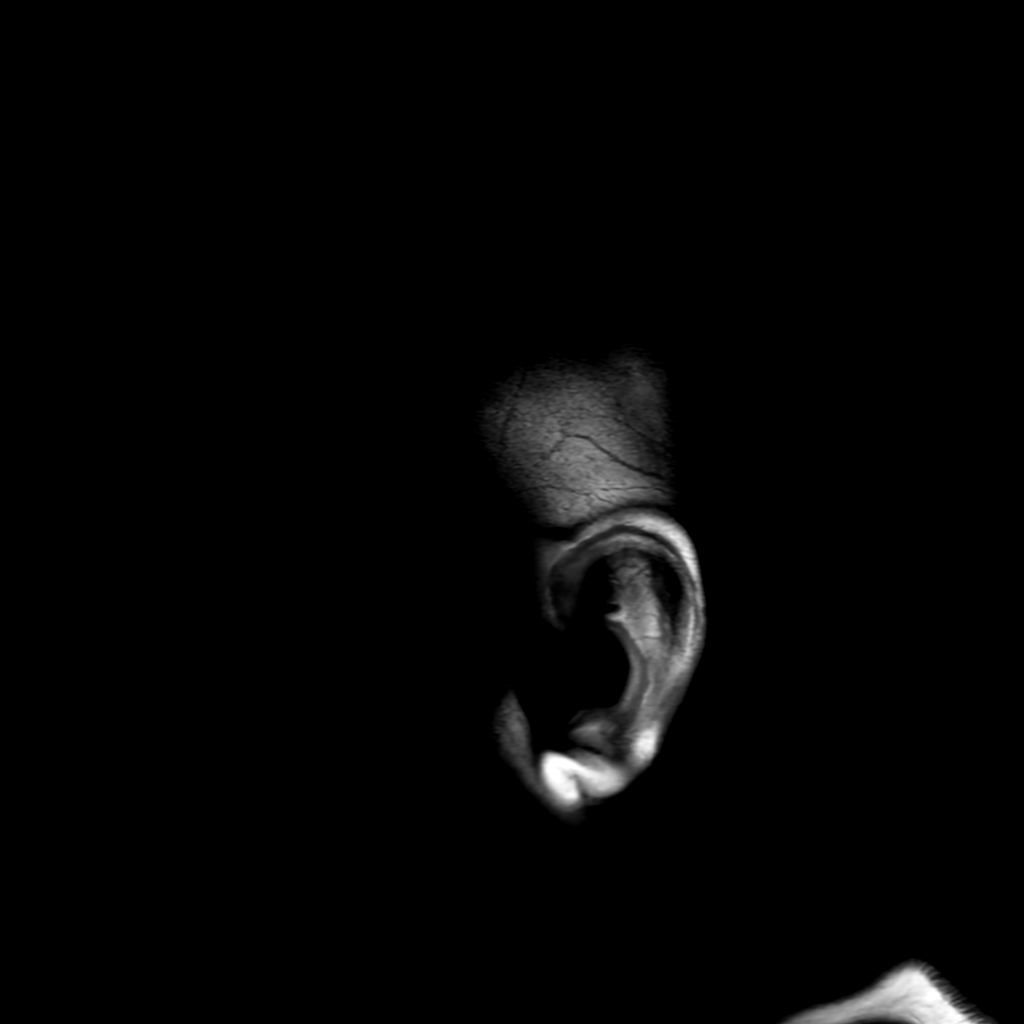

[Series 6: FLAIR · axial · 4.0mm · 0.45mm/px · z∈[-108,+37]mm · 3 of 35 slices shown (2 of 2)]
[im 1/35]
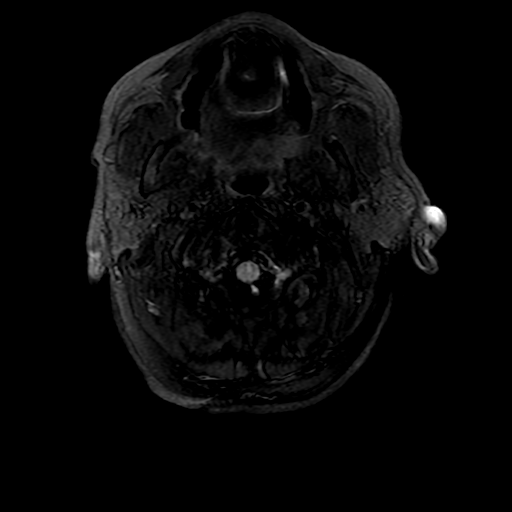
[im 18/35]
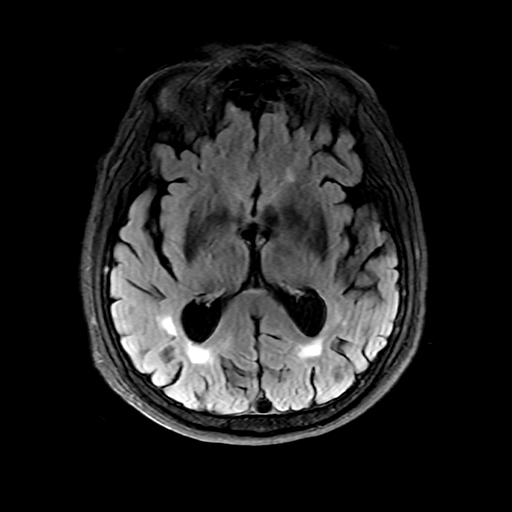
[im 35/35]
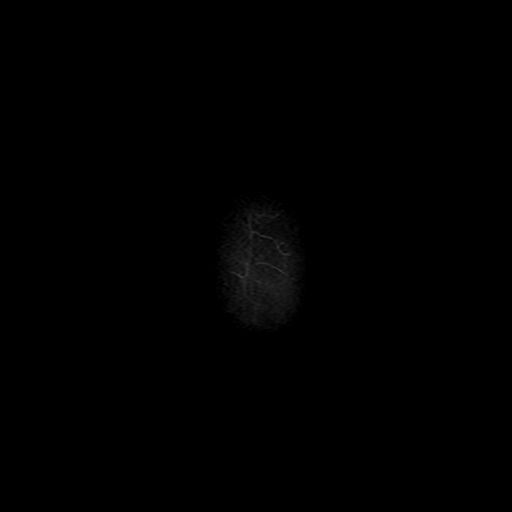

[Series 250: ADC · axial · 3.0mm · 0.94mm/px · z∈[-103,+50]mm · 4 of 52 slices shown (1 of 2)]
[im 1/52]
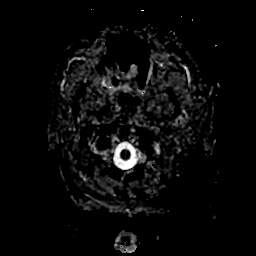
[im 18/52]
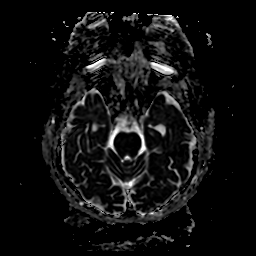
[im 35/52]
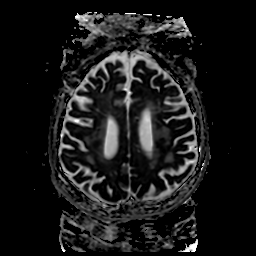
[im 52/52]
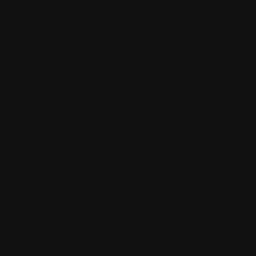

[Series 350: ADC · coronal · 4.0mm · 0.94mm/px · 3 of 34 slices shown (2 of 2)]
[im 1/34]
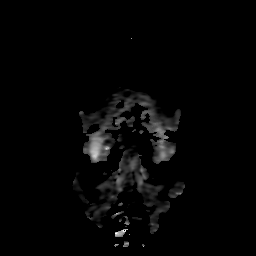
[im 17/34]
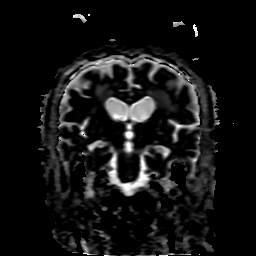
[im 34/34]
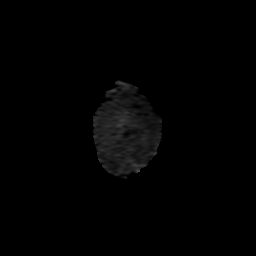

[24 of 48 positions shown; findings below may reference images not displayed]

FINDINGS: Brain: Multifocal acute ischemia, left-greater-than-right. Most of
the lesions are located in the left frontal and parietal lobes. No
acute or chronic hemorrhage. Hyperintense T2-weighted signal is
moderately widespread throughout the white matter. Generalized
volume loss without a clear lobar predilection. The midline
structures are normal.

Vascular: Major flow voids are preserved.

Skull and upper cervical spine: Normal calvarium and skull base.
Visualized upper cervical spine and soft tissues are normal.

Sinuses/Orbits:No paranasal sinus fluid levels or advanced mucosal
thickening. No mastoid or middle ear effusion. Normal orbits.
IMPRESSION: Multifocal acute ischemia, left-greater-than-right. No hemorrhage or
mass effect. This may be due to a central embolic process.

## 2022-01-22 MED ORDER — SODIUM CHLORIDE 0.9% FLUSH: 3.0000 mL | Freq: Once | INTRAVENOUS | Status: DC

## 2022-01-22 NOTE — ED Notes (Signed)
Patient transported to MRI 

## 2022-01-22 NOTE — Telephone Encounter (Signed)
STAT if HR is under 50 or over 120 (normal HR is 60-100 beats per minute)  What is your heart rate? 50's  Do you have a log of your heart rate readings (document readings)?   Do you have any other symptoms? Dizziness, Lightheadness.  Che from [facility] call to report patient HR and also patient can't afford his Jardiance.

## 2022-01-22 NOTE — ED Notes (Signed)
Brother Corbitt Cloke [phone] would like an  update

## 2022-01-22 NOTE — Telephone Encounter (Signed)
Pt walked into with caregiver he was advised to go to the ED due to other symptoms that were not given at the time of this phone call. Primary nurse noted in chart.

## 2022-01-22 NOTE — ED Triage Notes (Signed)
Patient sent by his MD for CT head. Had trouble using right arm last night and states that his grip still off, patient alert and oriented. Speech clear, steady gait. States that he had bypass surgery 6 weeks ago and on 2 blood thinners. Has had some dizziness but states related to meds because HR low

## 2022-01-22 NOTE — Telephone Encounter (Signed)
Pt along with his caregiver walked in to clinic today with symptoms of pt's right hand and arm numbness, he is having trouble moving the arm to get food to his mouth, issues buttoning his shirt and trouble swallowing. Pt states that his symptoms started this morning. Advised pt that he needs to be evaluated in the hospital including scans that can not be done here. Pt and caregiver verbalize understanding and will proceed to the hospital.

## 2022-01-22 NOTE — Consult Note (Signed)
NEUROLOGY CONSULTATION NOTE   Date of service: January 22, 2022 Patient Name: Joshua Wyatt MRN:  [id number] DOB:  02-Oct-1946 Reason for consult: "R arm weakness" Requesting Provider: Ripley Fraise, MD _ _ _   _ __   _ __ _ _  __ __   _ __   __ _  History of Present Illness  Joshua Wyatt is a 76 y.o. male with PMH significant for CAD, GERD, HTN, HLD, Peripheral vascular disease, pAfibb on Eliquis who presents with R arm weakness.  He was watching TV on 01/21/22 around 2200 and was getting ready to take his meds when he had sudden onset Rue weakness. Went to bed, woke up in the morning and it is somewhat improved bu was having trouble bringin the fork to his mouth. He eventually came to the ED.  Reports had a heart surgery 7 weeks ago.  LKW: 01/21/22 mRS: 0 tNKASE/Thrombectomy: outside the window. NIHSS components Score: Comment  1a Level of Conscious 0$RemoveBefor'[x]'JqIyJKnUgAjn$  '[]'$  2$R'[]'ij$  '[]'$      1b LOC Questions 0$RemoveBefore'[x]'xmQkfwKxeoRGV$  '[]'$  2$R'[]'Ve$       1c LOC Commands 0$RemoveBefor'[x]'BFDdOznhRCNA$  '[]'$  2$R'[]'EB$       2 Best Gaze 0$RemoveBe'[x]'yBYEWUSmw$  '[]'$  2$R'[]'ye$       3 Visual 0$RemoveB'[x]'uUIfVgLH$  '[]'$  2$R'[]'he$  '[]'$      4 Facial Palsy 0$RemoveBefor'[x]'lcTGCQqYiexp$  '[]'$  2$R'[]'VF$  '[]'$      5a Motor Arm - left 0$Remov'[x]'pSrjAF$  '[]'$  2$R'[]'rD$  '[]'$  4$R'[]'jZ$  U'[]'$    5b Motor Arm - Right 0$Remove'[x]'hWQkRpJ$  '[]'$  2$R'[]'kJ$  '[]'$  4$R'[]'kC$  U'[]'$    6a Motor Leg - Left 0$Remov'[x]'nTzJqe$  '[]'$  2$R'[]'qe$  '[]'$  4$R'[]'rd$  U'[]'$    6b Motor Leg - Right 0$Remove'[x]'FqNCvvy$  '[]'$  2$R'[]'qd$  '[]'$  4$R'[]'lR$  U'[]'$    7 Limb Ataxia 0$RemoveBefo'[x]'dLoSAZHauKx$  '[]'$  2$R'[]'Oe$  '[]'$  UN$R'[]'UG$     8 Sensory 0$RemoveBe'[x]'EKoGirVOJ$  '[]'$  2$R'[]'Md$  U'[]'$      9 Best Language 0$RemoveBefore'[x]'LEYYbSlKdPxRG$  '[]'$  2$R'[]'pl$  '[]'$      10 Dysarthria 0$RemoveBefore'[x]'mEjeXsgECmSnP$  '[]'$  2$R'[]'Rf$  U'[]'$      11 Extinct. and Inattention 0$RemoveBeforeDE'[x]'DemOWhpqaWLLiUf$  '[]'$  2$R'[]'SY$       TOTAL: 0      ROS   Constitutional Denies weight loss, fever and chills.   HEENT Denies changes in vision and hearing.   Respiratory Denies SOB and cough.   CV Denies palpitations and CP   GI Denies abdominal pain, nausea, vomiting and diarrhea.   GU Denies dysuria and urinary frequency.   MSK Denies myalgia and joint pain.   Skin Denies rash and pruritus.   Neurological Denies headache and syncope.   Psychiatric Denies recent changes in mood. Denies anxiety  and depression.    Past History   Past Medical History:  Diagnosis Date   Arthritis    "LITTLE IN MY LEFT HAND" (05/12/2018)   CAD (coronary artery disease)    a. cath 02/24/18 - 90% ost RCA; 40% ost & pro LAD   Deviated septum    RIGHT   GERD (gastroesophageal reflux disease)    History of kidney stones    Hyperlipemia    Hypertension 03/30/2013   PV Angiogram- rt. renal artery stent was widely open,50-60% lt. renal artery stenosis,lt. SFA stents patent with high grade above the knee  poplital stenosis   Normal cardiac stress test 03/25/2013   EF 60%, normal stress test ,this is a presurgical  test   PVD (peripheral vascular disease) ([place])    A. 2009: S/P PTA/stent to D SFA. B. 01/2017: angiosculpt atherectomy/drug-eluting balloon angioplasty to L SFA.   Renal artery stenosis ([place]) 03/02/2013   renal artery doppler-this was an abnormal doppler   Past  Surgical History:  Procedure Laterality Date   APPENDECTOMY  1960's   CAROTID ENDARTERECTOMY Bilateral 2000's   CORONARY ANGIOPLASTY WITH STENT PLACEMENT  03/13/2018   CORONARY ARTERY BYPASS GRAFT N/A 10/27/2021   Procedure: CORONARY ARTERY BYPASS GRAFTING (CABG) X THREE ON PUMP USING LEFT INTERNAL MAMMARY ARTERY AND RIGHT ENDOSCOPIC GREATER SAPHENOUS VEIN CONDUITS;  Surgeon: Loreli Slot, MD;  Location: [facility] OR;  Service: Open Heart Surgery;  Laterality: N/A;   CORONARY STENT INTERVENTION N/A 03/13/2018   Procedure: CORONARY STENT INTERVENTION;  Surgeon: Runell Gess, MD;  Location: [facility] INVASIVE CV LAB;  Service: Cardiovascular;  Laterality: N/A;   CYSTOSCOPY W/ STONE MANIPULATION  "X 4 or 5"   ENDOVEIN HARVEST OF GREATER SAPHENOUS VEIN Right 10/27/2021   Procedure: ENDOVEIN HARVEST OF GREATER SAPHENOUS VEIN;  Surgeon: Loreli Slot, MD;  Location: [facility] OR;  Service: Open Heart Surgery;  Laterality: Right;   LEFT HEART CATH AND CORONARY ANGIOGRAPHY N/A 02/24/2018   Procedure: LEFT HEART CATH AND CORONARY ANGIOGRAPHY;   Surgeon: Runell Gess, MD;  Location: [facility] INVASIVE CV LAB;  Service: Cardiovascular;  Laterality: N/A;   LEFT HEART CATH AND CORONARY ANGIOGRAPHY N/A 10/26/2021   Procedure: LEFT HEART CATH AND CORONARY ANGIOGRAPHY;  Surgeon: Runell Gess, MD;  Location: [facility] INVASIVE CV LAB;  Service: Cardiovascular;  Laterality: N/A;   LITHOTRIPSY  "several"   LOWER EXTREMITY ANGIOGRAM N/A 10/11/2014   Procedure: LOWER EXTREMITY ANGIOGRAM;  Surgeon: Runell Gess, MD;  Location: [facility] CATH LAB;  Service: Cardiovascular;  Laterality: N/A;   LOWER EXTREMITY ANGIOGRAPHY N/A 02/04/2017   Procedure: Lower Extremity Angiography;  Surgeon: Runell Gess, MD;  Location: [facility] INVASIVE CV LAB;  Service: Cardiovascular;  Laterality: N/A;   LOWER EXTREMITY ANGIOGRAPHY N/A 05/12/2018   Procedure: LOWER EXTREMITY ANGIOGRAPHY;  Surgeon: Runell Gess, MD;  Location: [facility] INVASIVE CV LAB;  Service: Cardiovascular;  Laterality: N/A;   PERCUTANEOUS STENT INTERVENTION  10/11/2014   Procedure: PERCUTANEOUS STENT INTERVENTION;  Surgeon: Runell Gess, MD;  Location[facility] CATH LAB;  Service: Cardiovascular;;  right sfax2   PERIPHERAL VASCULAR ATHERECTOMY  05/12/2018   Procedure: PERIPHERAL VASCULAR ATHERECTOMY;  Surgeon: Runell Gess, MD;  Location: [facility] INVASIVE CV LAB;  Service: Cardiovascular;;  left SFA   PERIPHERAL VASCULAR CATHETERIZATION N/A 01/03/2017   Procedure: Carotid PTA/Stent Intervention / Right;  Surgeon: Runell Gess, MD;  Location: [facility] INVASIVE CV LAB;  Service: Cardiovascular;  Laterality: N/A;   PERIPHERAL VASCULAR INTERVENTION Left 02/04/2017   Procedure: Peripheral Vascular Intervention;  Surgeon: Runell Gess, MD;  Location: [facility] INVASIVE CV LAB;  Service: Cardiovascular;  Laterality: Left;  left SFA   POPLITEAL ARTERY ANGIOPLASTY Left 03/30/2013   TEE WITHOUT CARDIOVERSION N/A 10/27/2021   Procedure: TRANSESOPHAGEAL ECHOCARDIOGRAM (TEE);  Surgeon: Loreli Slot, MD;  Location: [facility] OR;   Service: Open Heart Surgery;  Laterality: N/A;   TONSILLECTOMY  1960's   TOTAL KNEE ARTHROPLASTY Right 05/06/2019   Procedure: TOTAL KNEE ARTHROPLASTY;  Surgeon: Ranee Gosselin, MD;  Location: [facility] ORS;  Service: Orthopedics;  Laterality: Right;    TOTAL KNEE ARTHROPLASTY Left 08/29/2021   Procedure: TOTAL KNEE ARTHROPLASTY;  Surgeon: Durene Romans, MD;  Location: [facility] ORS;  Service: Orthopedics;  Laterality: Left;   Family History  Problem Relation Age of Onset   Heart disease Father        CABG   Healthy Sister    Healthy Brother    Social History   Socioeconomic History  Marital status: Widowed    Spouse name: Not on file   Number of children: Not on file   Years of education: 7   Highest education level: 7th grade  Occupational History   Occupation: Retired  Tobacco Use   Smoking status: Former    Packs/day: 1.00    Years: 58.00    Pack years: 58.00    Types: 74, Cigarettes    Quit date: 10/18/2013    Years since quitting: 8.2   Smokeless tobacco: Never  Vaping Use   Vaping Use: Never used  Substance and Sexual Activity   Alcohol use: Not Currently   Drug use: Never   Sexual activity: Yes  Other Topics Concern   Not on file  Social History Narrative   Not on file   Social Determinants of Health   Financial Resource Strain: Not on file  Food Insecurity: Not on file  Transportation Needs: Not on file  Physical Activity: Not on file  Stress: Not on file  Social Connections: Not on file   Allergies  Allergen Reactions   Statins Swelling and Other (See Comments)    Muscle pain, also    Medications  (Not in a hospital admission)    Vitals   Vitals:   01/22/22 2015 01/22/22 2200 01/22/22 2211 01/22/22 2215  BP: (!) 162/91  (!) 172/103 (!) 173/93  Pulse: (!) 55 61 (!) 59 (!) 58  Resp: (!) $RemoveB'21  13 17  'CILeTFtE$ Temp:      TempSrc:      SpO2: 99% 97% 98% 98%     There is no height or weight on file to calculate BMI.  Physical Exam   General: Laying  comfortably in bed; in no acute distress.  HENT: Normal oropharynx and mucosa. Normal external appearance of ears and nose.  Neck: Supple, no pain or tenderness  CV: No JVD. No peripheral edema.  Pulmonary: Symmetric Chest rise. Normal respiratory effort.  Abdomen: Soft to touch, non-tender.  Ext: No cyanosis, edema, or deformity  Skin: No rash. Normal palpation of skin.   Musculoskeletal: Normal digits and nails by inspection. No clubbing.   Neurologic Examination  Mental status/Cognition: Alert, oriented to self, place, month and year, good attention.  Speech/language: Fluent, comprehension intact, object naming intact, repetition intact.  Cranial nerves:   CN II Pupils equal and reactive to light, no VF deficits    CN III,IV,VI EOM intact, no gaze preference or deviation, no nystagmus    CN V normal sensation in V1, V2, and V3 segments bilaterally    CN VII no asymmetry, no nasolabial fold flattening    CN VIII normal hearing to speech    CN IX & X normal palatal elevation, no uvular deviation    CN XI 5/5 head turn and 5/5 shoulder shrug bilaterally    CN XII midline tongue protrusion    Motor:  Muscle bulk: normal, tone normal, pronator drift noted in RUE. tremor none Mvmt Root Nerve  Muscle Right Left Comments  SA C5/6 Ax Deltoid 5 5   EF C5/6 Mc Biceps 5 5   EE C6/7/8 Rad Triceps 5 5   WF C6/7 Med FCR     WE C7/8 PIN ECU     F Ab C8/T1 U ADM/FDI 5 5   HF L1/2/3 Fem Illopsoas 5 5   KE L2/3/4 Fem Quad 5 5   DF L4/5 D Peron Tib Ant 5 5   PF S1/2 Tibial Grc/Sol 5 5  Reflexes:  Right Left Comments  Pectoralis      Biceps (C5/6) 2 2   Brachioradialis (C5/6) 2 2    Triceps (C6/7) 2 2    Patellar (L3/4) 2 2    Achilles (S1)      Hoffman      Plantar     Jaw jerk    Sensation:  Light touch Intact throughout   Pin prick    Temperature    Vibration   Proprioception    Coordination/Complex Motor:  - Finger to Nose intact BL - Heel to shin intact BL - Rapid  alternating movement are mildly slowed in RUE - Gait: Deferred for patient safety.  Labs   CBC:  Recent Labs  Lab 01/22/22 1709 01/22/22 1818  WBC 8.5  --   NEUTROABS 6.1  --   HGB 13.7 14.6  HCT 42.6 43.0  MCV 92.4  --   PLT 254  --     Basic Metabolic Panel:  Lab Results  Component Value Date   NA 136 01/22/2022   K 4.5 01/22/2022   CO2 25 01/22/2022   GLUCOSE 100 (H) 01/22/2022   BUN 31 (H) 01/22/2022   CREATININE 2.30 (H) 01/22/2022   CALCIUM 9.8 01/22/2022   GFRNONAA 32 (L) 01/22/2022   GFRAA >60 05/08/2019   Lipid Panel:  Lab Results  Component Value Date   LDLCALC 30 11/02/2021   HgbA1c:  Lab Results  Component Value Date   HGBA1C 5.7 (H) 10/27/2021   Urine Drug Screen: No results found for: LABOPIA, COCAINSCRNUR, LABBENZ, AMPHETMU, THCU, LABBARB  Alcohol Level No results found for: [place]  CT Head without contrast(Personally reviewed): CTH was negative for a large hypodensity concerning for a large territory infarct or hyperdensity concerning for an ICH  MR Angio head without contrast and MR Angio neck without contrast(Personally reviewed): 1. Loss of flow related enhancement in the distal left vertebral artery V4 segment, which may indicate occlusion or stenosis. 2. Otherwise normal intracranial MRA.  1. Loss of normal flow related enhancement within the proximal right ICA. Question prior ICA stent or other intervention. Otherwise, this could represent severe stenosis or occlusion. 2. Severe stenosis or occlusion of the V4 segment of the left vertebral artery.   MRI Brain(Personally reviewed): Multifocal acute ischemia, left-greater-than-right. No hemorrhage or mass effect. This may be due to a central embolic process.   Impression   MELQUISEDEC JOURNEY is a 76 y.o. male with PMH significant for CAD, GERD, HTN, HLD, Peripheral vascular disease, pAfibb on Eliquis who presents with R arm weakness. Found to have embolic appearing strokes in L MCA more than  R MCA territory. He is compliant with his Eliquis and has not missed any doses.  Primary Diagnosis:  Cerebral infarction due to embolism of  bilateral middle cerebral artery.   Secondary Diagnosis: Essential (primary) hypertension and Paroxysmal atrial fibrillation  Recommendations  Plan:  - Frequent Neuro checks per stroke unit protocol - Recommend brain imaging with MRI Brain without contrast - Recommend Vasc US carotid duplex. Unfortunately MRA neck was unable to clarify if the stent is patent. - Recommend obtaining TTE - Recommend obtaining Lipid panel with LDL - Please start statin if LDL > 70 - Recommend HbA1c - Antithrombotic - Aspirin $RemoveB'81mg'cswKmcQC$  daily - Would recommend holding Eliquis for 2 days in the setting of stroke. - Recommend DVT ppx - SBP goal - permissive hypertension first 24 h < 220/110. Held home meds.  - Recommend Telemetry monitoring for arrythmia -  Recommend bedside swallow screen prior to PO intake. - Stroke education booklet - Recommend PT/OT/SLP consult  ______________________________________________________________________  Plan discussed with Dr. Christy Gentles with the ED team.  Thank you for the opportunity to take part in the care of this patient. If you have any further questions, please contact the neurology consultation attending.  Signed,  Fordyce Pager Number [phone] _ _ _   _ __   _ __ _ _  __ __   _ __   __ _

## 2022-01-22 NOTE — Telephone Encounter (Signed)
Called Che. She states pt has been symptomatic with his low heart rate just liked reported per Diona Browner. His symptoms are dizziness and lightheadedness. She states they went over safety things for his symptoms. She also states he was not able to afford the 90 day supply for Jardiance, They were about to get it down to a 30 supply for $47. "There will be an issue with him paying for the medication. Can he be prescribed something else?" Will get the message to Dr. Gwenlyn Found for review.

## 2022-01-22 NOTE — ED Provider Notes (Signed)
[facility] EMERGENCY DEPARTMENT Provider Note   CSN: [id number] Arrival date & time: 01/22/22  1639     History  Chief complaint-weakness   GER RINGENBERG is a 76 y.o. male.  The history is provided by the patient.  Neurologic Problem This is a new problem. The current episode started 12 to 24 hours ago. The problem occurs constantly. The problem has been gradually improving. Pertinent negatives include no chest pain, no abdominal pain, no headaches and no shortness of breath. Nothing aggravates the symptoms. Nothing relieves the symptoms.  Patient reports last known well was approximately 2230 on February 12.  He noted right arm weakness without numbness.  He also reports he had a floater in his left eye that has resolved.  He woke up this morning with some improvement but still weak. He has some neck pain.  No back pain.  No chest pain or shortness of breath.  No other weakness or numbness is reported    Home Medications Prior to Admission medications   Medication Sig Start Date End Date Taking? Authorizing Provider  acetaminophen (TYLENOL) 500 MG tablet Take 500-1,000 mg by mouth every 6 (six) hours as needed for moderate pain (for pain).    [provider]  amiodarone (PACERONE) 200 MG tablet Take 0.5 tablets (100 mg total) by mouth daily. 01/07/22   Kroeger, Lorelee Cover., PA-C  apixaban (ELIQUIS) 5 MG TABS tablet Take 1 tablet (5 mg total) by mouth 2 (two) times daily. 11/03/21   Antony Odea, PA-C  aspirin EC 81 MG tablet Take 81 mg by mouth in the morning. Swallow whole.    [provider]  carvedilol (COREG) 6.25 MG tablet Take 1 tablet (6.25 mg total) by mouth 2 (two) times daily with a meal. 01/07/22   Kroeger, Lorelee Cover., PA-C  empagliflozin (JARDIANCE) 10 MG TABS tablet Take 1 tablet (10 mg total) by mouth daily for 14 days. Rx for free 14 day supply 01/08/22 01/22/22  Kroeger, Lorelee Cover., PA-C  ezetimibe (ZETIA) 10 MG tablet Take 1 tablet  (10 mg total) by mouth daily. 11/10/21   Lorretta Harp, MD  furosemide (LASIX) 40 MG tablet Take 1 tablet (40 mg total) by mouth daily. 01/07/22   Kroeger, Lorelee Cover., PA-C  hydrALAZINE (APRESOLINE) 25 MG tablet Take 1 tablet (25 mg total) by mouth every 8 (eight) hours. 01/07/22   Kroeger, Lorelee Cover., PA-C  isosorbide dinitrate (ISORDIL) 10 MG tablet Take 1 tablet (10 mg total) by mouth 3 (three) times daily. 01/07/22   Kroeger, Lorelee Cover., PA-C  nitroGLYCERIN (NITROSTAT) 0.4 MG SL tablet PLACE 1 TABLET UNDER THE TONGUE  EVERY  5  MINUTES  AS  NEEDED  FOR  CHEST  PAIN Patient taking differently: Place 0.4 mg under the tongue every 5 (five) minutes as needed for chest pain. 10/24/21   Lorretta Harp, MD  pantoprazole (PROTONIX) 40 MG tablet Take 1 tablet by mouth once daily Patient taking differently: 40 mg daily. 09/13/21   Lorretta Harp, MD  polyethylene glycol (MIRALAX / GLYCOLAX) 17 g packet Take 17 g by mouth daily as needed for mild constipation. Patient not taking: Reported on 01/15/2022 08/30/21   Irving Copas, PA-C  REPATHA PUSHTRONEX SYSTEM 420 MG/3.5ML SOCT Inject 420 mg into the skin every 30 (thirty) days. 10/02/21   [provider]  tiZANidine (ZANAFLEX) 2 MG tablet Take 1 tablet (2 mg total) by mouth 2 (two) times daily as needed for muscle  spasms. Patient not taking: Reported on 01/15/2022 01/07/22   Abigail Butts., PA-C  traZODone (DESYREL) 50 MG tablet Take 0.5 tablets (25 mg total) by mouth at bedtime as needed for sleep. Please contact your PCP for refills 01/07/22   Abigail Butts., PA-C      Allergies    Statins    Review of Systems   Review of Systems  Eyes:  Positive for visual disturbance. Negative for pain.       Reported a blue floater left eye that has resolved.  No visual loss.  No diplopia  Respiratory:  Negative for shortness of breath.   Cardiovascular:  Negative for chest pain.  Gastrointestinal:  Negative for abdominal pain.  Neurological:   Positive for weakness. Negative for facial asymmetry, speech difficulty, light-headedness, numbness and headaches.  All other systems reviewed and are negative.  Physical Exam Updated Vital Signs BP (!) 164/90    Pulse (!) 51    Temp 97.7 F (36.5 C) (Oral)    Resp 18    SpO2 97%  Physical Exam CONSTITUTIONAL: Elderly, no acute distress HEAD: Normocephalic/atraumatic EYES: EOMI/PERRL, no nystagmus, no visual field deficit  no ptosis ENMT: Mucous membranes moist NECK: supple no meningeal signs, no bruits Previous CEA surgery noted CV: S1/S2 noted, no murmurs/rubs/gallops noted LUNGS: Lungs are clear to auscultation bilaterally, no apparent distress ABDOMEN: soft, nontender, no rebound or guarding GU:no cva tenderness NEURO:Awake/alert, face symmetric, no arm or leg drift is noted Decreased grip on the right.  Patient has difficulty lifting his arm above his head on the right but there is no significant drift of the right Appropriate strength noted in left arm and left leg. No speech difficulties noted No weakness noted in the right leg Cranial nerves 3/4/5/6/06/17/09/11/12 tested and intact No past pointing Sensation to light touch intact in all extremities EXTREMITIES: pulses normal, full ROM SKIN: warm, color normal PSYCH: no abnormalities of mood noted  ED Results / Procedures / Treatments   Labs (all labs ordered are listed, but only abnormal results are displayed) Labs Reviewed  PROTIME-INR - Abnormal; Notable for the following components:      Result Value   Prothrombin Time 15.4 (*)    All other components within normal limits  COMPREHENSIVE METABOLIC PANEL - Abnormal; Notable for the following components:   Glucose, Bld 106 (*)    BUN 30 (*)    Creatinine, Ser 2.10 (*)    GFR, Estimated 32 (*)    All other components within normal limits  I-STAT CHEM 8, ED - Abnormal; Notable for the following components:   BUN 31 (*)    Creatinine, Ser 2.30 (*)    Glucose, Bld  100 (*)    All other components within normal limits  CBG MONITORING, ED - Abnormal; Notable for the following components:   Glucose-Capillary 108 (*)    All other components within normal limits  APTT  CBC  DIFFERENTIAL    EKG EKG Interpretation  Date/Time:  Monday January 22 2022 18:17:48 EST Ventricular Rate:  55 PR Interval:  201 QRS Duration: 132 QT Interval:  476 QTC Calculation: 456 R Axis:   88 Text Interpretation: Sinus rhythm LVH with secondary repolarization abnormality Anterior Q waves, possibly due to LVH Confirmed by Ripley Fraise [phone]) on 01/22/2022 6:53:12 PM  Radiology CT HEAD WO CONTRAST  Result Date: 01/22/2022 CLINICAL DATA:  Neuro deficit, acute, stroke suspected EXAM: CT HEAD WITHOUT CONTRAST TECHNIQUE: Contiguous axial images were obtained from the base  of the skull through the vertex without intravenous contrast. RADIATION DOSE REDUCTION: This exam was performed according to the departmental dose-optimization program which includes automated exposure control, adjustment of the mA and/or kV according to patient size and/or use of iterative reconstruction technique. COMPARISON:  CT head Apr 16, 2016. FINDINGS: Brain: No evidence of acute large vascular territory infarction, hemorrhage, hydrocephalus, extra-axial collection or mass lesion/mass effect. Similar moderate patchy white matter hypoattenuation, nonspecific but compatible with chronic microvascular ischemic disease. Atrophy with ex vacuo ventricular dilation. Vascular: No hyperdense vessel identified. Skull: No acute fracture. Sinuses/Orbits: Mild paranasal sinus mucosal thickening. Unremarkable orbits. Other: No mastoid effusions. IMPRESSION: 1. No evidence of acute intracranial abnormality. 2. Chronic microvascular ischemic disease and cerebral atrophy (ICD10-G31.9). Electronically Signed   By: Margaretha Sheffield M.D.   On: 01/22/2022 17:32   MR ANGIO HEAD WO CONTRAST  Result Date: 01/22/2022 CLINICAL  DATA:  Acute neurologic deficit EXAM: MRA HEAD WITHOUT CONTRAST TECHNIQUE: Angiographic images of the Circle of Willis were acquired using MRA technique without intravenous contrast. COMPARISON:  No pertinent prior exam. FINDINGS: POSTERIOR CIRCULATION: --Vertebral arteries: Loss of flow related enhancement in the distal left V4 segment. Normal right. --Inferior cerebellar arteries: Normal. --Basilar artery: Normal. --Superior cerebellar arteries: Normal. --Posterior cerebral arteries: Normal. ANTERIOR CIRCULATION: --Intracranial internal carotid arteries: Normal. --Anterior cerebral arteries (ACA): Normal. --Middle cerebral arteries (MCA): Normal. ANATOMIC VARIANTS: None IMPRESSION: 1. Loss of flow related enhancement in the distal left vertebral artery V4 segment, which may indicate occlusion or stenosis. 2. Otherwise normal intracranial MRA. Electronically Signed   By: Ulyses Jarred M.D.   On: 01/22/2022 21:24    Procedures Procedures    Medications Ordered in ED Medications  sodium chloride flush (NS) 0.9 % injection 3 mL (has no administration in time range)    ED Course/ Medical Decision Making/ A&P Clinical Course as of 01/22/22 2325  Mon Jan 22, 2022  1832 Creatinine(!): 2.30 Renal insufficiency [DW]  2012 Discussed with neurologist Dr. Lorrin Goodell.  We will proceed with MRI brain and C-spine, he also recommends MRA head and neck without contrast.  We will call back with results [DW]  2013 Discussed with his [place] at [phone].  She confirms the story.  She was updated on plan [DW]  2241 Discussed MRI with neurology, he will see the patient [DW]  2316 Neurology recommends admission [DW]  2323 D/w dr Cyd Silence for admission [DW]    Clinical Course User Index [DW] Ripley Fraise, MD                           Medical Decision Making Amount and/or Complexity of Data Reviewed Labs: ordered. Decision-making details documented in ED Course. Radiology: ordered.  Risk Decision  regarding hospitalization.   This patient presents to the ED for concern of weakness, this involves an extensive number of treatment options, and is a complaint that carries with it a high risk of complications and morbidity.  The differential diagnosis includes TIA, stroke, brain tumor, intracranial hemorrhage, cervical radiculopathy, spinal stenosis, aortic stenosis  Comorbidities that complicate the patient evaluation: Patients presentation is complicated by their history of cardiac disease, vascular disease  Social Determinants of Health: Patients advanced age increases the complexity of managing their presentation  Additional history obtained: Records reviewed cardiology notes reviewed  Lab Tests: I Ordered, and personally interpreted labs.  The pertinent results include: Renal insufficiency  Imaging Studies ordered: I ordered imaging studies including CT scan head I  independently visualized and interpreted imaging which showed no acute findings I agree with the radiologist interpretation  Cardiac Monitoring: The patient was maintained on a cardiac monitor.  I personally viewed and interpreted the cardiac monitor which showed an underlying rhythm of:  sinus rhythm  Critical Interventions:       MRI imaging  Consultations Obtained: I requested consultation with the admitting physician [facility], and discussed  findings as well as pertinent plan - they recommend: Admission  Reevaluation: After the interventions noted above, I reevaluated the patient and found that they have :stayed the same  Complexity of problems addressed: Patients presentation is most consistent with  acute presentation with potential threat to life or bodily function      Disposition: After consideration of the diagnostic results and the patients response to treatment,  I feel that the patent would benefit from admission.            Final Clinical Impression(s) / ED Diagnoses Final  diagnoses:  Cerebrovascular accident (CVA), unspecified mechanism ([place])    Rx / [facility] Orders ED Discharge Orders     None         Ripley Fraise, MD 01/22/22 2326

## 2022-01-22 NOTE — ED Provider Triage Note (Signed)
Emergency Medicine Provider Triage Evaluation Note  Joshua Wyatt , a 76 y.o. male  was evaluated in triage.  Pt complains of right upper extremity weakness since last night.  Patient reports he noticed this around 10:30 PM while he was watching TV.  Patient states right after that he went to bed.  Woke up this morning with some improvement in symptoms however he noticed he was not able to use his arm while eating like he typically can.  Patient presented to his PCP for evaluation.  Referred here for further evaluation.  Patient lives alone.  Denies difficulty with speech, facial droop, new difficulty with ambulation..  Review of Systems  Positive: As above Negative: As above  Physical Exam  BP (!) 164/76    Pulse (!) 52    Temp 97.7 F (36.5 C) (Oral)    Resp 15    SpO2 100%  Gen:   Awake, no distress   Resp:  Normal effort  MSK:   Moves extremities without difficulty.  4/5 strength in right upper extremity compared to contralateral side.  Without pronator drift.  Bilateral lower extremities with 5/5 strength. Other:  Without facial droop.  Without dysarthria.  Medical Decision Making  Medically screening exam initiated at 5:12 PM.  Appropriate orders placed.  Joshua Wyatt was informed that the remainder of the evaluation will be completed by another provider, this initial triage assessment does not replace that evaluation, and the importance of remaining in the ED until their evaluation is complete.     Evlyn Courier, PA-C 01/22/22 1714

## 2022-01-23 ENCOUNTER — Encounter (HOSPITAL_COMMUNITY): Payer: Self-pay | Admitting: Internal Medicine

## 2022-01-23 ENCOUNTER — Inpatient Hospital Stay (HOSPITAL_COMMUNITY): Payer: Medicare Other

## 2022-01-23 DIAGNOSIS — E782 Mixed hyperlipidemia: Secondary | ICD-10-CM

## 2022-01-23 DIAGNOSIS — I2510 Atherosclerotic heart disease of native coronary artery without angina pectoris: Secondary | ICD-10-CM

## 2022-01-23 DIAGNOSIS — I639 Cerebral infarction, unspecified: Secondary | ICD-10-CM

## 2022-01-23 DIAGNOSIS — I5042 Chronic combined systolic (congestive) and diastolic (congestive) heart failure: Secondary | ICD-10-CM

## 2022-01-23 DIAGNOSIS — N1832 Chronic kidney disease, stage 3b: Secondary | ICD-10-CM

## 2022-01-23 DIAGNOSIS — I480 Paroxysmal atrial fibrillation: Secondary | ICD-10-CM

## 2022-01-23 DIAGNOSIS — K219 Gastro-esophageal reflux disease without esophagitis: Secondary | ICD-10-CM

## 2022-01-23 DIAGNOSIS — I10 Essential (primary) hypertension: Secondary | ICD-10-CM

## 2022-01-23 LAB — COMPREHENSIVE METABOLIC PANEL
ALT: 22 U/L (ref 0–44)
AST: 23 U/L (ref 15–41)
Albumin: 3.8 g/dL (ref 3.5–5.0)
Alkaline Phosphatase: 108 U/L (ref 38–126)
Anion gap: 11 (ref 5–15)
BUN: 32 mg/dL — ABNORMAL HIGH (ref 8–23)
CO2: 25 mmol/L (ref 22–32)
Calcium: 9.8 mg/dL (ref 8.9–10.3)
Chloride: 100 mmol/L (ref 98–111)
Creatinine, Ser: 1.9 mg/dL — ABNORMAL HIGH (ref 0.61–1.24)
GFR, Estimated: 36 mL/min — ABNORMAL LOW (ref >60)
Glucose, Bld: 96 mg/dL (ref 70–99)
Potassium: 4.1 mmol/L (ref 3.5–5.1)
Sodium: 136 mmol/L (ref 135–145)
Total Bilirubin: 0.6 mg/dL (ref 0.3–1.2)
Total Protein: 7.2 g/dL (ref 6.5–8.1)

## 2022-01-23 LAB — CBC WITH DIFFERENTIAL/PLATELET
Abs Immature Granulocytes: 0.04 10*3/uL (ref 0.00–0.07)
Basophils Absolute: 0 10*3/uL (ref 0.0–0.1)
Basophils Relative: 0 %
Eosinophils Absolute: 0 10*3/uL (ref 0.0–0.5)
Eosinophils Relative: 0 %
HCT: 43.3 % (ref 39.0–52.0)
Hemoglobin: 13.9 g/dL (ref 13.0–17.0)
Immature Granulocytes: 0 %
Lymphocytes Relative: 24 %
Lymphs Abs: 2.2 10*3/uL (ref 0.7–4.0)
MCH: 29.4 pg (ref 26.0–34.0)
MCHC: 32.1 g/dL (ref 30.0–36.0)
MCV: 91.5 fL (ref 80.0–100.0)
Monocytes Absolute: 0.9 10*3/uL (ref 0.1–1.0)
Monocytes Relative: 10 %
Neutro Abs: 6.1 10*3/uL (ref 1.7–7.7)
Neutrophils Relative %: 66 %
Platelets: 251 10*3/uL (ref 150–400)
RBC: 4.73 MIL/uL (ref 4.22–5.81)
RDW: 14.3 % (ref 11.5–15.5)
WBC: 9.2 10*3/uL (ref 4.0–10.5)
nRBC: 0 % (ref 0.0–0.2)

## 2022-01-23 LAB — LIPID PANEL
Cholesterol: 306 mg/dL — ABNORMAL HIGH (ref 0–200)
HDL: 43 mg/dL (ref >40)
LDL Cholesterol: UNDETERMINED mg/dL (ref 0–99)
Total CHOL/HDL Ratio: 7.1 RATIO
Triglycerides: 467 mg/dL — ABNORMAL HIGH (ref <150)
VLDL: UNDETERMINED mg/dL (ref 0–40)

## 2022-01-23 LAB — RESP PANEL BY RT-PCR (FLU A&B, COVID) ARPGX2
Influenza A by PCR: NEGATIVE
Influenza B by PCR: NEGATIVE
SARS Coronavirus 2 by RT PCR: NEGATIVE

## 2022-01-23 LAB — GLUCOSE, CAPILLARY: Glucose-Capillary: 113 mg/dL — ABNORMAL HIGH (ref 70–99)

## 2022-01-23 LAB — HEMOGLOBIN A1C
Hgb A1c MFr Bld: 5.4 % (ref 4.8–5.6)
Mean Plasma Glucose: 108.28 mg/dL

## 2022-01-23 LAB — LDL CHOLESTEROL, DIRECT: Direct LDL: 192.8 mg/dL — ABNORMAL HIGH (ref 0–99)

## 2022-01-23 LAB — MAGNESIUM: Magnesium: 2.2 mg/dL (ref 1.7–2.4)

## 2022-01-23 MED ORDER — ISOSORBIDE DINITRATE 10 MG PO TABS
10.0000 mg | ORAL_TABLET | Freq: Three times a day (TID) | ORAL | Status: DC
  Administered 2022-01-23 (×2): 10 mg via ORAL
  Filled 2022-01-23 (×4): qty 1

## 2022-01-23 MED ORDER — POLYETHYLENE GLYCOL 3350 17 G PO PACK: 17.0000 g | PACK | Freq: Every day | ORAL | Status: DC | PRN

## 2022-01-23 MED ORDER — ASPIRIN 81 MG PO CHEW
81.0000 mg | CHEWABLE_TABLET | Freq: Every day | ORAL | Status: DC
  Administered 2022-01-23 – 2022-01-25 (×3): 81 mg via ORAL
  Filled 2022-01-23 (×3): qty 1

## 2022-01-23 MED ORDER — ONDANSETRON HCL 4 MG/2ML IJ SOLN: 4.0000 mg | Freq: Four times a day (QID) | INTRAMUSCULAR | Status: DC | PRN

## 2022-01-23 MED ORDER — TIZANIDINE HCL 4 MG PO TABS: 2.0000 mg | ORAL_TABLET | Freq: Two times a day (BID) | ORAL | Status: DC | PRN

## 2022-01-23 MED ORDER — ONDANSETRON HCL 4 MG PO TABS: 4.0000 mg | ORAL_TABLET | Freq: Four times a day (QID) | ORAL | Status: DC | PRN

## 2022-01-23 MED ORDER — FUROSEMIDE 40 MG PO TABS
40.0000 mg | ORAL_TABLET | Freq: Every day | ORAL | Status: DC
  Administered 2022-01-23: 40 mg via ORAL
  Filled 2022-01-23: qty 2

## 2022-01-23 MED ORDER — TRAZODONE HCL 50 MG PO TABS: 25.0000 mg | ORAL_TABLET | Freq: Every evening | ORAL | Status: DC | PRN

## 2022-01-23 MED ORDER — EVOLOCUMAB WITH INFUSOR 420 MG/3.5ML ~~LOC~~ SOCT: 420.0000 mg | SUBCUTANEOUS | Status: DC

## 2022-01-23 MED ORDER — NITROGLYCERIN 0.4 MG SL SUBL: 0.4000 mg | SUBLINGUAL_TABLET | SUBLINGUAL | Status: DC | PRN

## 2022-01-23 MED ORDER — PANTOPRAZOLE SODIUM 40 MG PO TBEC
40.0000 mg | DELAYED_RELEASE_TABLET | Freq: Every day | ORAL | Status: DC
  Administered 2022-01-23 – 2022-01-25 (×3): 40 mg via ORAL
  Filled 2022-01-23 (×3): qty 1

## 2022-01-23 MED ORDER — EZETIMIBE 10 MG PO TABS
10.0000 mg | ORAL_TABLET | Freq: Every day | ORAL | Status: DC
Start: 2022-01-23 — End: 2022-01-25
  Administered 2022-01-23 – 2022-01-25 (×3): 10 mg via ORAL
  Filled 2022-01-23 (×3): qty 1

## 2022-01-23 MED ORDER — AMIODARONE HCL 200 MG PO TABS
100.0000 mg | ORAL_TABLET | Freq: Every day | ORAL | Status: DC
  Administered 2022-01-23 – 2022-01-25 (×3): 100 mg via ORAL
  Filled 2022-01-23 (×3): qty 1

## 2022-01-23 MED ORDER — NITROGLYCERIN 0.4 MG SL SUBL
0.4000 mg | SUBLINGUAL_TABLET | SUBLINGUAL | 0 refills | Status: AC | PRN
Start: 2022-01-23 — End: ?

## 2022-01-23 MED ORDER — STROKE: EARLY STAGES OF RECOVERY BOOK: Freq: Once | Status: AC

## 2022-01-23 MED ORDER — ACETAMINOPHEN 325 MG PO TABS: 650.0000 mg | ORAL_TABLET | Freq: Four times a day (QID) | ORAL | Status: DC | PRN

## 2022-01-23 MED ORDER — ACETAMINOPHEN 650 MG RE SUPP: 650.0000 mg | Freq: Four times a day (QID) | RECTAL | Status: DC | PRN

## 2022-01-23 MED ORDER — NIACIN 100 MG PO TABS
100.0000 mg | ORAL_TABLET | Freq: Two times a day (BID) | ORAL | Status: DC
  Administered 2022-01-23 – 2022-01-25 (×5): 100 mg via ORAL
  Filled 2022-01-23 (×6): qty 1

## 2022-01-23 MED ORDER — METOPROLOL TARTRATE 12.5 MG HALF TABLET: 12.5000 mg | ORAL_TABLET | Freq: Two times a day (BID) | ORAL | Status: DC

## 2022-01-23 MED ORDER — HYDRALAZINE HCL 20 MG/ML IJ SOLN: 10.0000 mg | Freq: Four times a day (QID) | INTRAMUSCULAR | Status: DC | PRN

## 2022-01-23 NOTE — ED Notes (Signed)
Singh MD at bedside

## 2022-01-23 NOTE — Significant Event (Signed)
Rapid Response Event Note   Reason for Call :  Code Blue Call: Patient had slumped over in chair after complaining of being dizzy  Initial Focused Assessment:  Patient is now back in bed and is alert and oriented.  He states that this was his 1st time up in the past 24 hours.  BP while up in chair 108/75 After lying in bed BP 188/102 & 162/84  Stroke Team also at bedside to assess patient NIHSS 0  Interventions:  NS bolus  Plan of Care:     Event Summary:   MD Notified: Dr Reeves Forth Call Time: 1823 Arrival Time: 1824 End Time: 1842   Raliegh Ip, RN

## 2022-01-23 NOTE — Assessment & Plan Note (Addendum)
·   Patient presenting with right arm weakness since Sunday night  Initial noncontrast CT imaging of the brain unremarkable for acute infarct  Performing serial neurologic checks  Monitoring patient on telemetry  Holding home regimen of Eliquis for 48 hours per neurology recommendations and instead placing patient on aspirin  On Zetia, patient allergic to statin therapy  Further imaging to include: MRA of the head and neck as well as carotid ultrasound  Obtaining hemoglobin A1c and lipid panel in the morning  Echocardiogram in the morning  PT, OT, SLP evaluation  Permissive hypertension with as needed antihypertensives only to be given if blood pressure greater than 220/115  Neurology following in consultation.

## 2022-01-23 NOTE — Progress Notes (Addendum)
STROKE TEAM PROGRESS NOTE   SUBJECTIVE (INTERVAL HISTORY) He is seen alone at the bedside.  Overall his condition is stable.  Patient reports that last night prior to bed, he fell acute right upper extremity weakness and had difficulty extending his fingers from a closed fist position.  He had no difficulties ambulating, no lower extremity weakness, facial droop, no dysarthria or other speech changes, and no left upper extremity weakness.  This morning, patient reports less weakness.  CT head with no acute abnormality and chronic Small vessel disease. MRI with multifocal bilateral acute ischemia, left greater than right.  MRA with possible occlusion or stenosis of left vertebral artery V4 segment; loss of normal flow within the proximal right ICA, likely due to stent versus stenosis/occlusion.  OBJECTIVE CBC:  Recent Labs  Lab 01/22/22 1709 01/22/22 1818 01/23/22 0400  WBC 8.5  --  9.2  NEUTROABS 6.1  --  6.1  HGB 13.7 14.6 13.9  HCT 42.6 43.0 43.3  MCV 92.4  --  91.5  PLT 254  --  147   Basic Metabolic Panel:  Recent Labs  Lab 01/22/22 1709 01/22/22 1818 01/23/22 0400  NA 136 136 136  K 4.5 4.5 4.1  CL 101 103 100  CO2 25  --  25  GLUCOSE 106* 100* 96  BUN 30* 31* 32*  CREATININE 2.10* 2.30* 1.90*  CALCIUM 9.8  --  9.8  MG  --   --  2.2   Lipid Panel:  Recent Labs  Lab 01/23/22 0400  CHOL 306*  TRIG 467*  HDL 43  CHOLHDL 7.1  VLDL UNABLE TO CALCULATE IF TRIGLYCERIDE OVER 400 mg/dL  LDLCALC UNABLE TO CALCULATE IF TRIGLYCERIDE OVER 400 mg/dL   LDL cholesterol, direct       Component Ref Range & Units 04:00 3 yr ago 5 yr ago  Direct LDL 0 - 99 mg/dL 192.8 High   97  208 High  R, CM        HgbA1c:  Recent Labs  Lab 01/23/22 0400  HGBA1C 5.4   Urine Drug Screen: No results for input(s): LABOPIA, COCAINSCRNUR, LABBENZ, AMPHETMU, THCU, LABBARB in the last 168 hours.  Alcohol Level No results for input(s): ETH in the last 168 hours.  IMAGING past 24 hours CT  HEAD WO CONTRAST  Result Date: 01/22/2022 CLINICAL DATA:  Neuro deficit, acute, stroke suspected EXAM: CT HEAD WITHOUT CONTRAST TECHNIQUE: Contiguous axial images were obtained from the base of the skull through the vertex without intravenous contrast. RADIATION DOSE REDUCTION: This exam was performed according to the departmental dose-optimization program which includes automated exposure control, adjustment of the mA and/or kV according to patient size and/or use of iterative reconstruction technique. COMPARISON:  CT head Apr 16, 2016. FINDINGS: Brain: No evidence of acute large vascular territory infarction, hemorrhage, hydrocephalus, extra-axial collection or mass lesion/mass effect. Similar moderate patchy white matter hypoattenuation, nonspecific but compatible with chronic microvascular ischemic disease. Atrophy with ex vacuo ventricular dilation. Vascular: No hyperdense vessel identified. Skull: No acute fracture. Sinuses/Orbits: Mild paranasal sinus mucosal thickening. Unremarkable orbits. Other: No mastoid effusions. IMPRESSION: 1. No evidence of acute intracranial abnormality. 2. Chronic microvascular ischemic disease and cerebral atrophy (ICD10-G31.9). Electronically Signed   By: Margaretha Sheffield M.D.   On: 01/22/2022 17:32   MR ANGIO HEAD WO CONTRAST  Result Date: 01/22/2022 CLINICAL DATA:  Acute neurologic deficit EXAM: MRA HEAD WITHOUT CONTRAST TECHNIQUE: Angiographic images of the Circle of Willis were acquired using MRA technique without intravenous contrast.  COMPARISON:  No pertinent prior exam. FINDINGS: POSTERIOR CIRCULATION: --Vertebral arteries: Loss of flow related enhancement in the distal left V4 segment. Normal right. --Inferior cerebellar arteries: Normal. --Basilar artery: Normal. --Superior cerebellar arteries: Normal. --Posterior cerebral arteries: Normal. ANTERIOR CIRCULATION: --Intracranial internal carotid arteries: Normal. --Anterior cerebral arteries (ACA): Normal. --Middle  cerebral arteries (MCA): Normal. ANATOMIC VARIANTS: None IMPRESSION: 1. Loss of flow related enhancement in the distal left vertebral artery V4 segment, which may indicate occlusion or stenosis. 2. Otherwise normal intracranial MRA. Electronically Signed   By: Ulyses Jarred M.D.   On: 01/22/2022 21:24   MR ANGIO NECK WO CONTRAST  Result Date: 01/22/2022 CLINICAL DATA:  Weakness EXAM: MRA NECK WITHOUT CONTRAST TECHNIQUE: Angiographic images of the neck were acquired using MRA technique without intravenous contrast. Carotid stenosis measurements (when applicable) are obtained utilizing NASCET criteria, using the distal internal carotid diameter as the denominator. COMPARISON:  No pertinent prior exam. FINDINGS: Aortic arch: Not visualized Right carotid system: Loss of normal flow related enhancement within the proximal right ICA. Distal right ICA is patent. Left carotid system: No occlusion or stenosis. Vertebral arteries: Right dominant. There is loss of normal flow related enhancement within the left V4 segment. The right is 1 Other: None. IMPRESSION: 1. Loss of normal flow related enhancement within the proximal right ICA. Question prior ICA stent or other intervention. Otherwise, this could represent severe stenosis or occlusion. 2. Severe stenosis or occlusion of the V4 segment of the left vertebral artery. Electronically Signed   By: Ulyses Jarred M.D.   On: 01/22/2022 21:36   MR BRAIN WO CONTRAST  Result Date: 01/22/2022 CLINICAL DATA:  Transient ischemic attack EXAM: MRI HEAD WITHOUT CONTRAST TECHNIQUE: Multiplanar, multiecho pulse sequences of the brain and surrounding structures were obtained without intravenous contrast. COMPARISON:  None. FINDINGS: Brain: Multifocal acute ischemia, left-greater-than-right. Most of the lesions are located in the left frontal and parietal lobes. No acute or chronic hemorrhage. Hyperintense T2-weighted signal is moderately widespread throughout the white matter.  Generalized volume loss without a clear lobar predilection. The midline structures are normal. Vascular: Major flow voids are preserved. Skull and upper cervical spine: Normal calvarium and skull base. Visualized upper cervical spine and soft tissues are normal. Sinuses/Orbits:No paranasal sinus fluid levels or advanced mucosal thickening. No mastoid or middle ear effusion. Normal orbits. IMPRESSION: Multifocal acute ischemia, left-greater-than-right. No hemorrhage or mass effect. This may be due to a central embolic process. Electronically Signed   By: Ulyses Jarred M.D.   On: 01/22/2022 22:16   MR Cervical Spine Wo Contrast  Result Date: 01/22/2022 CLINICAL DATA:  Cervical radiculopathy EXAM: MRI CERVICAL SPINE WITHOUT CONTRAST TECHNIQUE: Multiplanar, multisequence MR imaging of the cervical spine was performed. No intravenous contrast was administered. COMPARISON:  None. FINDINGS: Alignment: Physiologic. Vertebrae: No fracture, evidence of discitis, or bone lesion. Cord: Normal signal and morphology. Posterior Fossa, vertebral arteries, paraspinal tissues: Loss of normal flow void at the left V4 segment. Disc levels: C1-2: Unremarkable. C2-3: Small disc bulge with endplate spurring. There is no spinal canal stenosis. Mild left neural foraminal stenosis. C3-4: Small disc bulge with uncovertebral hypertrophy. There is no spinal canal stenosis. Mild right and moderate left neural foraminal stenosis. C4-5: Small disc bulge with endplate spurring. There is no spinal canal stenosis. Moderate bilateral neural foraminal stenosis. C5-6: Small disc bulge with right uncovertebral hypertrophy. There is no spinal canal stenosis. Severe right neural foraminal stenosis. C6-7: Small right asymmetric disc bulge. There is no spinal canal stenosis. Mild right  neural foraminal stenosis. C7-T1: Normal disc space and facet joints. There is no spinal canal stenosis. No neural foraminal stenosis. IMPRESSION: 1. Multilevel cervical  degenerative disc disease without spinal canal stenosis. 2. Severe right C5-6 neural foraminal stenosis. 3. Moderate bilateral C4-5 and left C3-4 neural foraminal stenosis. 4. Loss of normal flow void at the left V4 segment may be due to slow flow or occlusion. Electronically Signed   By: Ulyses Jarred M.D.   On: 01/22/2022 21:48   VAS US CAROTID  Result Date: 01/23/2022 Carotid Arterial Duplex Study Patient Name:  KAIZEN IBSEN  Date of Exam:   01/23/2022 Medical Rec #: [id number]      Accession #:    [id number] Date of Birth: 13-Apr-1946     Patient Gender: M Patient Age:   1 years Exam Location:  [facility] Procedure:      VAS US CAROTID Referring Phys: Deno Etienne [facility] --------------------------------------------------------------------------------  Indications:       CVA and Right stent. Risk Factors:      Hypertension, hyperlipidemia, coronary artery disease, PAD. Other Factors:     CABG x3, bilateral endarterectomies. Comparison Study:  MRA performed 01-22-2022 showed "loss of normal flow related                    enhancement within the proximal right ICA. Question prior ICA                    stent or other intervention."                     11-08-2021 Prior carotid duplex showed patent right ICA                    stent, 1-39% left ICA stenosis, and bilateral ECA stenosis                    >50%. Performing Technologist: Darlin Coco RDMS, RVT  Examination Guidelines: A complete evaluation includes B-mode imaging, spectral Doppler, color Doppler, and power Doppler as needed of all accessible portions of each vessel. Bilateral testing is considered an integral part of a complete examination. Limited examinations for reoccurring indications may be performed as noted.  Right Carotid Findings: +----------+--------+--------+--------+------------------+--------+             PSV cm/s EDV cm/s Stenosis Plaque Description Comments  +----------+--------+--------+--------+------------------+--------+  CCA  Prox   40       10                                             +----------+--------+--------+--------+------------------+--------+  CCA Distal 44       13                                             +----------+--------+--------+--------+------------------+--------+  ICA Prox                                                 Stent     +----------+--------+--------+--------+------------------+--------+  ICA Distal 34       12                                             +----------+--------+--------+--------+------------------+--------+  ECA        65       7                                              +----------+--------+--------+--------+------------------+--------+ +----------+--------+-------+----------------+-------------------+             PSV cm/s EDV cms Describe         Arm Pressure (mmHG)  +----------+--------+-------+----------------+-------------------+  Subclavian 80               Multiphasic, WNL                      +----------+--------+-------+----------------+-------------------+ +---------+--------+--+--------+--+---------+  Vertebral PSV cm/s 39 EDV cm/s 11 Antegrade  +---------+--------+--+--------+--+---------+  Right Stent(s): +---------------+--+--++++  Prox to Stent   44 13     +---------------+--+--++++  Proximal Stent  22 8      +---------------+--+--++++  Mid Stent       25 9      +---------------+--+--++++  Distal Stent    37 14     +---------------+--+--++++  Distal to Stent 44 17     +---------------+--+--++++   Left Carotid Findings: +----------+--------+--------+--------+------------------+--------+             PSV cm/s EDV cm/s Stenosis Plaque Description Comments  +----------+--------+--------+--------+------------------+--------+  CCA Prox   32       7                                              +----------+--------+--------+--------+------------------+--------+  CCA Distal 29       10                                              +----------+--------+--------+--------+------------------+--------+  ICA Prox   29       7                                              +----------+--------+--------+--------+------------------+--------+  ICA Distal 44       17                                             +----------+--------+--------+--------+------------------+--------+  ECA        110               >50%                                  +----------+--------+--------+--------+------------------+--------+ +----------+--------+--------+---------+-------------------+             PSV cm/s EDV cm/s Describe  Arm Pressure (mmHG)  +----------+--------+--------+---------+-------------------+  Subclavian 145               Turbulent                      +----------+--------+--------+---------+-------------------+ +---------+--------+--+--------+--+---------+  Vertebral PSV cm/s 55 EDV cm/s 13 Antegrade  +---------+--------+--+--------+--+---------+   Summary: Right Carotid: The ECA appears <50% stenosed. Patent right ICA stent. Left Carotid: Velocities in the left ICA are consistent with a 1-39% stenosis.               The ECA appears >50% stenosed. Vertebrals:  Bilateral vertebral arteries demonstrate antegrade flow. Subclavians: Left subclavian artery flow was disturbed. Normal flow hemodynamics              were seen in the right subclavian artery. *See table(s) above for measurements and observations.     Preliminary    VAS [place] TRANSCRANIAL DOPPLER (at [facility] and [facility] only)  Result Date: 01/23/2022  Transcranial Doppler Patient Name:  MAKARIOS MADLOCK  Date of Exam:   01/23/2022 Medical Rec #: [id number]      Accession #:    [id number] Date of Birth: July 18, 1946     Patient Gender: M Patient Age:   74 years Exam Location:  [facility] Procedure:      VAS [place] TRANSCRANIAL DOPPLER Referring Phys: Inda Merlin --------------------------------------------------------------------------------  Indications: Stroke. History: HTN, HLD, CAD s/p CABG, PAD,  bilateral carotid endarterectomies, right ICA stent. Limitations for diagnostic windows: Unable to insonate right transtemporal window. Unable to insonate left transtemporal window. Comparison Study: No prior studies. Performing Technologist: Darlin Coco RDMS, RVT  Examination Guidelines: A complete evaluation includes B-mode imaging, spectral Doppler, color Doppler, and power Doppler as needed of all accessible portions of each vessel. Bilateral testing is considered an integral part of a complete examination. Limited examinations for reoccurring indications may be performed as noted.  +----------+-------------+----------+-----------+------------------+  RIGHT TCD  Right VM (cm) Depth (cm) Pulsatility      Comment        +----------+-------------+----------+-----------+------------------+  MCA                                             Unable to insonate  +----------+-------------+----------+-----------+------------------+  ACA                                             Unable to insonate  +----------+-------------+----------+-----------+------------------+  Term ICA                                        Unable to insonate  +----------+-------------+----------+-----------+------------------+  PCA                                             Unable to insonate  +----------+-------------+----------+-----------+------------------+  Opthalmic      15.00                   0.72                         +----------+-------------+----------+-----------+------------------+  ICA siphon     31.00                   1.15                         +----------+-------------+----------+-----------+------------------+  Vertebral     -30.00                   1.02                         +----------+-------------+----------+-----------+------------------+  +----------+------------+----------+-----------+------------------+  LEFT TCD   Left VM (cm) Depth (cm) Pulsatility      Comment         +----------+------------+----------+-----------+------------------+  MCA                                            Unable to insonate  +----------+------------+----------+-----------+------------------+  ACA                                            Unable to insonate  +----------+------------+----------+-----------+------------------+  Term ICA                                       Unable to insonate  +----------+------------+----------+-----------+------------------+  PCA                                            Unable to insonate  +----------+------------+----------+-----------+------------------+  Opthalmic     11.00                   1.01                         +----------+------------+----------+-----------+------------------+  ICA siphon    14.00                   0.67                         +----------+------------+----------+-----------+------------------+  Vertebral     -32.00                  1.00                         +----------+------------+----------+-----------+------------------+  +------------+------+------------------+               VM cm       Comment        +------------+------+------------------+  Prox Basilar -34.00                     +------------+------+------------------+  Dist Basilar        Unable to insonate  +------------+------+------------------+    Preliminary      PHYSICAL EXAM Temp:  [97.7 F (36.5 C)] 97.7 F (36.5 C) (02/13 1647) Pulse Rate:  [51-62] 58 (02/14 1202) Resp:  [9-21] 14 (02/14 1202) BP: (133-199)/(72-143) 152/88 (02/14 1202) SpO2:  [92 %-100 %] 97 % (02/14 1202) Weight:  [74.4 kg] 74.4 kg (02/14 0755)  General - Well nourished, well developed, elderly Caucasian male in no apparent distress.  Cardiovascular - Regular rhythm and rate.  Mental Status -  Level of arousal and orientation to time, place, and person were intact. Language including expression, naming, repetition,  comprehension was assessed and found intact without  dysarthria Attention span and concentration were normal. Recent and remote memory were intact. Fund of Knowledge was assessed and was intact.  Cranial Nerves II - XII - II - Visual field intact OU. III, IV, VI - Extraocular movements intact. V - Facial sensation intact bilaterally. VII - Facial movement intact bilaterally. VIII - Hearing & vestibular intact bilaterally. X - Palate elevates symmetrically. XI - Chin turning & shoulder shrug intact bilaterally. XII - Tongue protrusion intact.  Motor Strength - The patients strength was normal in all extremities and pronator drift was absent.  Strength was decreased in the intrinsic muscles of the right hand as well as in wrist flexion.  Orbits left over right upper extremity.  Diminished fine finger movements on the right.  Otherwise normal in the right upper extremity and left upper extremity.  Bulk was normal and fasciculations were absent.   Motor Tone - Muscle tone was assessed at the neck and appendages and was normal.  Sensory - Light touch was assessed and  symmetrical.    Coordination - The patient had normal movements in the hands and feet with no ataxia or dysmetria. Tremor was absent.  Gait and Station - deferred.    ASSESSMENT/PLAN Mr. DAIKI DICOSTANZO is a 76 y.o. male with history of CAD, PAF status post CABG, GERD, HTN, HLD, Peripheral vascular disease, PAF, s/p bilateral Carotid endarterectomy on Eliquis presenting with R arm weakness.   Stroke:  bilateral infarcts of multiple sides, left greater than right likely secondary due to embolism from atrial fibrillation despite being on anticoagulation with Eliquis Code Stroke CT head No acute abnormality.  Chronic Small vessel disease.  MRI multifocal bilateral acute ischemia, left greater than right MRA possible occlusion or stenosis of left vertebral artery V4 segment; loss of normal flow within the proximal right ICA, likely due to stent versus stenosis/occlusion MRI  cervical spine, severe right C5-6 foraminal stenosis, moderate bilateral C4-C5 and left C3-4 neuroforaminal stenosis. Carotid Doppler  : Right ECA less than 50% stenosis; patent right ICA with stent.  Left ICA with 1 to 39% stenosis; left ECA greater than 50% stenosis Transcranial Doppler: Pending 2D Echo 01/06/2022: LVEF 40 to 45% with global hypokinesis of LV and concentric LVH; grade 1 diastolic dysfunction; mild to moderate MVR; tricuspid aortic valve with moderate calcification and thickening and moderate aortic valve stenosis LDL 192.8 HgbA1c 5.4 VTE prophylaxis -SCDs    Diet   Diet Heart Room service appropriate? Yes; Fluid consistency: Thin   aspirin 81 mg daily and Eliquis (apixaban) daily prior to admission, now on Eliquis (apixaban) daily.  Advised to discontinue aspirin Therapy recommendations: Outpatient PT, home health OT Disposition: Pending  Hypertension Home meds: Coreg 6.25 mg twice daily, Lasix 40 mg, hydralazine 25 mg twice daily, isosorbide dinitrate 10 mg 3 times daily Stable Permissive hypertension (OK if < 220/120) within the first 24 hours but gradually normalize in 5-7 days Long-term BP goal normotensive  Hyperlipidemia Hypertriglyceridemia Home meds: Zetia 10 mg, resumed in hospital LDL 192.8, goal < 70 High intensity statin indicated but patient with allergies to statins (swelling, muscle pain)  Diabetes type II Controlled Home meds: Jardiance 10 mg HgbA1c 5.4, goal < 7.0 CBGs Recent Labs    01/22/22 1711  GLUCAP 108*    SSI  Other Stroke Risk Factors Advanced Age >/= 53  Former cigarette smoker, quit 2014 ETOH use, alcohol level No results found for requested labs within last 26280 hours.,  advised to drink no more than 1-2 drink(s) a day Substance abuse - UDS:  THC No results found for requested labs within last 26280 hours., Cocaine No results found for requested labs within last 26280 hours.. Patient advised to stop using due to stroke  risk. Peripheral vascular disease: 2009: S/P PTA/stent to D SFA. B. 01/2017: angiosculpt atherectomy/drug-eluting balloon angioplasty to L SFA. Renal Artery Stenosis H/o Bilateral Carotid endarterectomy  CHF  Other Active Problems CAD: S/p DES-RCA in 2019, s/p CABG x3 (LIMA-LAD, SVG-OM, SVG-PDA) in 16/60 Combined systolic and diastolic heart failure Home meds: ASA, isosorbide dinitrate, carvedilol, Jardiance, Lasix, hydralazine, Repatha, Zetia Discontinue ASA due to increased bleeding risk Postoperative atrial fibrillation Home med: Eliquis 5 mg twice daily   Hospital day # 1    Rosezetta Schlatter, MD Stroke Neurology- Neuro Psych Resident 01/23/2022 2:34 PM  STROKE MD NOTE : I have personally obtained history,examined this patient, reviewed notes, independently viewed imaging studies, participated in medical decision making and plan of care.ROS completed by me personally and pertinent positives fully documented  I have made any additions or clarifications directly to the above note. Agree with note above.  Patient presented with sudden onset of right hand weakness due to embolic left parietal MCA branch infarct as well as by cerebral embolic infarcts likely from A-fib despite being on anticoagulation with Eliquis.  I had a long discussion with the patient with regards to availability medication options for stroke prevention in A-fib and discussed possibly switching to Pradaxa or Xarelto but he does not want to do this as it may involve increasing his co-pay and there is no definitive data suggesting switching is of any benefit.  I strongly encourage you consider using Repatha or Praluent for his elevated lipids as he has significant statin intolerance.  Continue aggressive risk factor modification.  Discussed with Dr. Candiss Norse.  Greater than 50% time during this 50-minute visit was spent on counseling and coordination of care about his embolic stroke and discussion about stroke prevention medications  options for atrial fibrillation and answered questions.    Antony Contras, MD Medical Director [facility] Pager: [phone] 01/23/2022 5:38 PM    To contact Stroke Continuity provider, please refer to [email or web address] After hours, contact General Neurology

## 2022-01-23 NOTE — Assessment & Plan Note (Signed)
·   Continue home regimen of Zetia  Patient is allergic to statins  Follow-up on lipid panel morning

## 2022-01-23 NOTE — Assessment & Plan Note (Signed)
Strict intake and output monitoring Creatinine near baseline Minimizing nephrotoxic agents as much as possible Serial chemistries to monitor renal function and electrolytes  

## 2022-01-23 NOTE — ED Notes (Signed)
MD at bedside. 

## 2022-01-23 NOTE — Evaluation (Signed)
Occupational Therapy Evaluation Patient Details Name: Joshua Wyatt MRN: [id number] DOB: 1946/03/05 Today's Date: 01/23/2022   History of Present Illness The pt is a 76 yo male presenting 2/13 with R sided weakness.  MRI with multifocal acute ischemia L>R, per neurology embolic looking CVAs.   PMH includes: L TKA (9/22), R TKA (5/20), CAD, CKD III, HTN, HLD, afib, HFrEF, mild-moderate AS, and PAD.   Clinical Impression   PTA patient independent and driving. Admitted for above and presenting with mild unsteadiness, R UE weakness and decreased [place].  He currently requires min guard for transfers, up to min assist for mobility, and up to mod assist for ADLs.  Patient educated on increased functional use of dominant R UE for strength and coordination.  Believe he will benefit from continued OT services while admitted and after dc at [facility] level to optimize independence, safety and return to PLOF.  Will follow.      Recommendations for follow up therapy are one component of a multi-disciplinary discharge planning process, led by the attending physician.  Recommendations may be updated based on patient status, additional functional criteria and insurance authorization.   Follow Up Recommendations  Home health OT    Assistance Recommended at Discharge PRN  Patient can return home with the following A little help with walking and/or transfers;A little help with bathing/dressing/bathroom;Assist for transportation;Assistance with cooking/housework;Direct supervision/assist for medications management    Functional Status Assessment  Patient has had a recent decline in their functional status and demonstrates the ability to make significant improvements in function in a reasonable and predictable amount of time.  Equipment Recommendations  None recommended by OT    Recommendations for Other Services       Precautions / Restrictions Precautions Precautions: None Restrictions Weight Bearing  Restrictions: No      Mobility Bed Mobility Overal bed mobility: Needs Assistance Bed Mobility: Supine to Sit, Sit to Supine     Supine to sit: Mod assist Sit to supine: Min guard   General bed mobility comments: for trunk support to ascend from ED stretcher    Transfers Overall transfer level: Needs assistance Equipment used: None Transfers: Sit to/from Stand Sit to Stand: Min guard           General transfer comment: for safety from elevated ED stretcher      Balance Overall balance assessment: Needs assistance Sitting-balance support: No upper extremity supported, Feet supported Sitting balance-Leahy Scale: Fair     Standing balance support: No upper extremity supported, During functional activity Standing balance-Leahy Scale: Fair Standing balance comment: mild unsteadiness                           ADL either performed or assessed with clinical judgement   ADL Overall ADL's : Needs assistance/impaired     Grooming: Min guard;Standing           Upper Body Dressing : Set up;Sitting   Lower Body Dressing: Moderate assistance;Sit to/from stand Lower Body Dressing Details (indicate cue type and reason): assist for socks, min guard sit to stand Toilet Transfer: Ambulation;Min guard Toilet Transfer Details (indicate cue type and reason): min assist during mobility due to unsteadiness         Functional mobility during ADLs: Min guard;Minimal assistance       Vision   Vision Assessment?: No apparent visual deficits     Perception     Praxis      Pertinent Vitals/Pain  Pain Assessment Pain Assessment: No/denies pain     Hand Dominance Right   Extremity/Trunk Assessment Upper Extremity Assessment Upper Extremity Assessment: RUE deficits/detail RUE Deficits / Details: mild weakness proximally 3+/5 MMT, decreased FMC RUE Sensation: decreased light touch RUE Coordination: decreased fine motor   Lower Extremity Assessment Lower  Extremity Assessment: Defer to PT evaluation       Communication Communication Communication: No difficulties   Cognition Arousal/Alertness: Awake/alert Behavior During Therapy: WFL for tasks assessed/performed Overall Cognitive Status: Within Functional Limits for tasks assessed                                       General Comments  VSS on RA    Exercises     Shoulder Instructions      Home Living Family/patient expects to be discharged to:: Private residence Living Arrangements: Alone Available Help at Discharge: Family;Available PRN/intermittently Type of Home: House Home Access: Stairs to enter [employer] of Steps: 3 Entrance Stairs-Rails: Left Home Layout: One level     Bathroom Shower/Tub: Occupational psychologist: Standard     Home Equipment: Conservation officer, nature (2 wheels);Cane - single point          Prior Functioning/Environment Prior Level of Function : Independent/Modified Independent;Driving             Mobility Comments: independent, enjoys golfing; intermittently uses cane ADLs Comments: independent        OT Problem List: Decreased strength;Decreased activity tolerance;Impaired balance (sitting and/or standing);Decreased coordination;Impaired UE functional use      OT Treatment/Interventions: Self-care/ADL training;DME and/or AE instruction;Balance training;Neuromuscular education;Therapeutic activities    OT Goals(Current goals can be found in the care plan section) Acute Rehab OT Goals Patient Stated Goal: home OT Goal Formulation: With patient Time For Goal Achievement: 02/06/22 Potential to Achieve Goals: Good  OT Frequency: Min 2X/week    Co-evaluation              AM-PAC OT "6 Clicks" Daily Activity     Outcome Measure Help from another person eating meals?: A Little Help from another person taking care of personal grooming?: A Little Help from another person toileting, which includes  using toliet, bedpan, or urinal?: A Little Help from another person bathing (including washing, rinsing, drying)?: A Little Help from another person to put on and taking off regular upper body clothing?: A Little Help from another person to put on and taking off regular lower body clothing?: A Little 6 Click Score: 18   End of Session Equipment Utilized During Treatment: Gait belt Nurse Communication: Mobility status  Activity Tolerance: Patient tolerated treatment well Patient left: in bed;with call bell/phone within reach  OT Visit Diagnosis: Other abnormalities of gait and mobility (R26.89);Other symptoms and signs involving the nervous system (R29.898)                Time: 3976-7341 OT Time Calculation (min): 17 min Charges:  OT General Charges $OT Visit: 1 Visit OT Evaluation $OT Eval Moderate Complexity: 1 Mod  Jolaine Artist, OT Acute Rehabilitation Services Pager [phone] Office [phone]   Delight Stare 01/23/2022, 11:15 AM

## 2022-01-23 NOTE — Assessment & Plan Note (Addendum)
•   Patient is currently chest pain free  Monitoring patient on telemetry  Continue home regimen of antiplatelet therapy, Zetia and AV nodal blocking therapy

## 2022-01-23 NOTE — Progress Notes (Signed)
PROGRESS NOTE                                                                                                                                                                                                             Patient Demographics:    Joshua Wyatt, is a 76 y.o. male, DOB - May 26, 1946, BSW:[id number]  Outpatient Primary MD for the patient is Joshua Morning, DO    LOS - 1  Admit date - 01/22/2022    CC - R arm weakness  Brief Narrative (HPI from H&P)  76 year old male with past medical history of coronary artery disease (s/p DES-RCA in 2019, s/p CABG x3 10/2021), S/P bilateral carotid endarterectomies and subsequent stenting of the right coronary artery,  moderate aortic stenosis, systolic and diastolic congestive heart failure (Echo 12/2021 40-45% with G1DD), hypertension, hyperlipidemia, chronic kidney disease stage IIIb (baseline creatinine 1.8-2.2), paroxysmal atrial fibrillation on Eliquis, gastroesophageal reflux disease, hyperlipidemia who presents to [facility] emergency department with complaints of right upper extremity weakness for > 12hrs, was diagnosed with acute stroke seen by neurology and admitted to the hospital.   Subjective:    Joshua Wyatt today has, No headache, No chest pain, No abdominal pain - No Nausea, No new weakness tingling or numbness, no SOB, R arm weakness much better.   Assessment  & Plan :    Acute CVA ischemic versus embolic causing right arm weakness.  Seen by neurology team, defer management to them, MRI/MRA and recent echocardiogram noted, he was already on Eliquis however recommendations right now per the stroke team are to hold Eliquis and place him on aspirin along with Zetia, will add niacin for better LDL control he has severe statin allergy.  May require outpatient infusion with Repatha for LDL management in the future.  Will get carotid ultrasound, further work-up per stroke  team.  Symptoms already much improved.  2.  Dyslipidemia with severe statin allergy.  Was on Zetia added niacin.  Outpatient Repatha infusions can be considered by PCP.  3.  History of CAD and chronic systolic heart failure EF 45%.  He is s/p CABG in November 2022 along with drug-eluting stent to RCA in 2019.  Currently no acute issues and chest pain-free, continue Imdur, aspirin, low-dose beta-blocker if blood pressure stays stable.  Compensated from CHF standpoint.  4.  History  of carotid endarterectomies.  Continue aspirin along with Zetia and niacin for secondary prevention, has severe statin allergy, carotid ultrasound ordered will monitor.  5.  Paroxysmal atrial fibrillation [place] vas 2 score of greater than 6.  Currently Eliquis on hold per neurology recommendation.  Continue low-dose beta-blocker if blood pressure can tolerate along with aspirin for secondary prevention.  Continue home dose amiodarone.  6.  Hypertension.  Permissive hypertension for now due to stroke.  Lowest dose Lopressor along with Imdur for CAD, with holding parameters for Lopressor.  7.  CKD 4.  Baseline creatinine around 2.  Monitor.  8.  GERD.  On PPI.  9.  Moderate aortic valve stenosis.  Monitor with supportive care.      Condition - Fair  Family Communication  :  None present  Code Status :  Full  Consults  :  Neuro  PUD Prophylaxis : PPI   Procedures  :     Carotid US -  TTE - 01/06/22 -  1. Left ventricular ejection fraction, by estimation, is 40 to 45%. The left ventricle has mildly decreased function. The left ventricle demonstrates global hypokinesis. There is mild concentric left ventricular  hypertrophy. Left ventricular diastolic parameters are consistent with Grade I diastolic dysfunction (impaired  relaxation). Elevated left atrial pressure.   2. Right ventricular systolic function is mildly reduced. The right ventricular size is normal. Tricuspid regurgitation signal is inadequate for  assessing PA pressure.   3. Left atrial size was moderately dilated.   4. The mitral valve is normal in structure. Mild to moderate mitral valve regurgitation.   5. The aortic valve is tricuspid. There is moderate calcification of the aortic valve. There is moderate thickening of the aortic valve. Aortic valve regurgitation is not visualized. Moderate aortic valve stenosis.  Aortic valve mean gradient measures 13.3 mmHg. Aortic valve Vmax measures 2.52 m/s.    MRI -  Multifocal acute ischemia, left-greater-than-right. No hemorrhage or mass effect. This may be due to a central embolic process.  Multilevel cervical degenerative disc disease without spinal canal stenosis. 2. Severe right C5-6 neural foraminal stenosis. 3. Moderate bilateral C4-5 and left C3-4 neural foraminal stenosis. 4. Loss of normal flow void at the left V4 segment may be due to slow flow or occlusion.   MRA - 1. Loss of normal flow related enhancement within the proximal right ICA. Question prior ICA stent or other intervention. Otherwise, this could represent severe stenosis or occlusion. 2. Severe stenosis or occlusion of the V4 segment of the left vertebral artery.      Disposition Plan  :    Status is: Inpatient  DVT Prophylaxis  :    Place and maintain sequential compression device Start: 01/23/22 0549 SCDs Start: 01/23/22 0249    Lab Results  Component Value Date   PLT 251 01/23/2022    Diet :  Diet Order             Diet Heart Room service appropriate? Yes; Fluid consistency: Thin  Diet effective now                    Inpatient Medications  Scheduled Meds:  amiodarone  100 mg Oral Daily   aspirin  81 mg Oral Daily   ezetimibe  10 mg Oral Daily   furosemide  40 mg Oral Daily   isosorbide dinitrate  10 mg Oral TID   pantoprazole  40 mg Oral Daily   sodium chloride flush  3 mL  Intravenous Once   Continuous Infusions: PRN Meds:.acetaminophen **OR** acetaminophen, hydrALAZINE,  nitroGLYCERIN, ondansetron **OR** ondansetron (ZOFRAN) IV, polyethylene glycol, tiZANidine, traZODone  Antibiotics  :    Anti-infectives (From admission, onward)    None        Time Spent in minutes  30   Joshua Wyatt M.D on 01/23/2022 at 8:58 AM  To page go to [email or web address]   [facility] -  Office  [phone]  See all Orders from today for further details    Objective:   Vitals:   01/23/22 0700 01/23/22 0715 01/23/22 0750 01/23/22 0755  BP: (!) 192/97 (!) 173/86 (!) 177/89   Pulse: (!) 58  61   Resp: 15  11   Temp:      TempSrc:      SpO2: 97%  97%   Weight:    74.4 kg  Height:    '5\' 7"'$  (1.702 m)    Wt Readings from Last 3 Encounters:  01/23/22 74.4 kg  01/15/22 75.6 kg  01/07/22 71.6 kg    No intake or output data in the 24 hours ending 01/23/22 0858   Physical Exam  Awake Alert, No new F.N deficits,  mild R arm weakness 5/5 .AT,PERRAL Supple Neck, No JVD,   Symmetrical Chest wall movement, Good air movement bilaterally, CTAB RRR,No Gallops,Rubs or new Murmurs,  +ve B.Sounds, Abd Soft, No tenderness,   No Cyanosis, Clubbing or edema      Data Review:    CBC Recent Labs  Lab 01/22/22 1709 01/22/22 1818 01/23/22 0400  WBC 8.5  --  9.2  HGB 13.7 14.6 13.9  HCT 42.6 43.0 43.3  PLT 254  --  251  MCV 92.4  --  91.5  MCH 29.7  --  29.4  MCHC 32.2  --  32.1  RDW 14.3  --  14.3  LYMPHSABS 1.5  --  2.2  MONOABS 0.8  --  0.9  EOSABS 0.0  --  0.0  BASOSABS 0.0  --  0.0    Electrolytes Recent Labs  Lab 01/22/22 1709 01/22/22 1818 01/23/22 0400  NA 136 136 136  K 4.5 4.5 4.1  CL 101 103 100  CO2 25  --  25  GLUCOSE 106* 100* 96  BUN 30* 31* 32*  CREATININE 2.10* 2.30* 1.90*  CALCIUM 9.8  --  9.8  AST 24  --  23  ALT 23  --  22  ALKPHOS 112  --  108  BILITOT 0.5  --  0.6  ALBUMIN 3.9  --  3.8  MG  --   --  2.2  INR 1.2  --   --   HGBA1C  --   --  5.4     ------------------------------------------------------------------------------------------------------------------ Recent Labs    01/23/22 0400  CHOL 306*  HDL 43  LDLCALC UNABLE TO CALCULATE IF TRIGLYCERIDE OVER 400 mg/dL  TRIG 467*  CHOLHDL 7.1  LDLDIRECT 192.8*    Lab Results  Component Value Date   HGBA1C 5.4 01/23/2022    No results for input(s): TSH, T4TOTAL, T3FREE, THYROIDAB in the last 72 hours.  Invalid input(s): FREET3 ------------------------------------------------------------------------------------------------------------------ ID Labs Recent Labs  Lab 01/22/22 1709 01/22/22 1818 01/23/22 0400  WBC 8.5  --  9.2  PLT 254  --  251  CREATININE 2.10* 2.30* 1.90*   Cardiac Enzymes No results for input(s): CKMB, TROPONINI, MYOGLOBIN in the last 168 hours.  Invalid input(s): CK   Micro Results No results found for this or any  previous visit (from the past 240 hour(s)).  Radiology Reports CT HEAD WO CONTRAST  Result Date: 01/22/2022 CLINICAL DATA:  Neuro deficit, acute, stroke suspected EXAM: CT HEAD WITHOUT CONTRAST TECHNIQUE: Contiguous axial images were obtained from the base of the skull through the vertex without intravenous contrast. RADIATION DOSE REDUCTION: This exam was performed according to the departmental dose-optimization program which includes automated exposure control, adjustment of the mA and/or kV according to patient size and/or use of iterative reconstruction technique. COMPARISON:  CT head Apr 16, 2016. FINDINGS: Brain: No evidence of acute large vascular territory infarction, hemorrhage, hydrocephalus, extra-axial collection or mass lesion/mass effect. Similar moderate patchy white matter hypoattenuation, nonspecific but compatible with chronic microvascular ischemic disease. Atrophy with ex vacuo ventricular dilation. Vascular: No hyperdense vessel identified. Skull: No acute fracture. Sinuses/Orbits: Mild paranasal sinus mucosal  thickening. Unremarkable orbits. Other: No mastoid effusions. IMPRESSION: 1. No evidence of acute intracranial abnormality. 2. Chronic microvascular ischemic disease and cerebral atrophy (ICD10-G31.9). Electronically Signed   By: Margaretha Sheffield M.D.   On: 01/22/2022 17:32   MR ANGIO HEAD WO CONTRAST  Result Date: 01/22/2022 CLINICAL DATA:  Acute neurologic deficit EXAM: MRA HEAD WITHOUT CONTRAST TECHNIQUE: Angiographic images of the Circle of Willis were acquired using MRA technique without intravenous contrast. COMPARISON:  No pertinent prior exam. FINDINGS: POSTERIOR CIRCULATION: --Vertebral arteries: Loss of flow related enhancement in the distal left V4 segment. Normal right. --Inferior cerebellar arteries: Normal. --Basilar artery: Normal. --Superior cerebellar arteries: Normal. --Posterior cerebral arteries: Normal. ANTERIOR CIRCULATION: --Intracranial internal carotid arteries: Normal. --Anterior cerebral arteries (ACA): Normal. --Middle cerebral arteries (MCA): Normal. ANATOMIC VARIANTS: None IMPRESSION: 1. Loss of flow related enhancement in the distal left vertebral artery V4 segment, which may indicate occlusion or stenosis. 2. Otherwise normal intracranial MRA. Electronically Signed   By: Ulyses Jarred M.D.   On: 01/22/2022 21:24   MR ANGIO NECK WO CONTRAST  Result Date: 01/22/2022 CLINICAL DATA:  Weakness EXAM: MRA NECK WITHOUT CONTRAST TECHNIQUE: Angiographic images of the neck were acquired using MRA technique without intravenous contrast. Carotid stenosis measurements (when applicable) are obtained utilizing NASCET criteria, using the distal internal carotid diameter as the denominator. COMPARISON:  No pertinent prior exam. FINDINGS: Aortic arch: Not visualized Right carotid system: Loss of normal flow related enhancement within the proximal right ICA. Distal right ICA is patent. Left carotid system: No occlusion or stenosis. Vertebral arteries: Right dominant. There is loss of normal flow  related enhancement within the left V4 segment. The right is 1 Other: None. IMPRESSION: 1. Loss of normal flow related enhancement within the proximal right ICA. Question prior ICA stent or other intervention. Otherwise, this could represent severe stenosis or occlusion. 2. Severe stenosis or occlusion of the V4 segment of the left vertebral artery. Electronically Signed   By: Ulyses Jarred M.D.   On: 01/22/2022 21:36   MR BRAIN WO CONTRAST  Result Date: 01/22/2022 CLINICAL DATA:  Transient ischemic attack EXAM: MRI HEAD WITHOUT CONTRAST TECHNIQUE: Multiplanar, multiecho pulse sequences of the brain and surrounding structures were obtained without intravenous contrast. COMPARISON:  None. FINDINGS: Brain: Multifocal acute ischemia, left-greater-than-right. Most of the lesions are located in the left frontal and parietal lobes. No acute or chronic hemorrhage. Hyperintense T2-weighted signal is moderately widespread throughout the white matter. Generalized volume loss without a clear lobar predilection. The midline structures are normal. Vascular: Major flow voids are preserved. Skull and upper cervical spine: Normal calvarium and skull base. Visualized upper cervical spine and soft tissues are normal.  Sinuses/Orbits:No paranasal sinus fluid levels or advanced mucosal thickening. No mastoid or middle ear effusion. Normal orbits. IMPRESSION: Multifocal acute ischemia, left-greater-than-right. No hemorrhage or mass effect. This may be due to a central embolic process. Electronically Signed   By: Ulyses Jarred M.D.   On: 01/22/2022 22:16   MR Cervical Spine Wo Contrast  Result Date: 01/22/2022 CLINICAL DATA:  Cervical radiculopathy EXAM: MRI CERVICAL SPINE WITHOUT CONTRAST TECHNIQUE: Multiplanar, multisequence MR imaging of the cervical spine was performed. No intravenous contrast was administered. COMPARISON:  None. FINDINGS: Alignment: Physiologic. Vertebrae: No fracture, evidence of discitis, or bone lesion.  Cord: Normal signal and morphology. Posterior Fossa, vertebral arteries, paraspinal tissues: Loss of normal flow void at the left V4 segment. Disc levels: C1-2: Unremarkable. C2-3: Small disc bulge with endplate spurring. There is no spinal canal stenosis. Mild left neural foraminal stenosis. C3-4: Small disc bulge with uncovertebral hypertrophy. There is no spinal canal stenosis. Mild right and moderate left neural foraminal stenosis. C4-5: Small disc bulge with endplate spurring. There is no spinal canal stenosis. Moderate bilateral neural foraminal stenosis. C5-6: Small disc bulge with right uncovertebral hypertrophy. There is no spinal canal stenosis. Severe right neural foraminal stenosis. C6-7: Small right asymmetric disc bulge. There is no spinal canal stenosis. Mild right neural foraminal stenosis. C7-T1: Normal disc space and facet joints. There is no spinal canal stenosis. No neural foraminal stenosis. IMPRESSION: 1. Multilevel cervical degenerative disc disease without spinal canal stenosis. 2. Severe right C5-6 neural foraminal stenosis. 3. Moderate bilateral C4-5 and left C3-4 neural foraminal stenosis. 4. Loss of normal flow void at the left V4 segment may be due to slow flow or occlusion. Electronically Signed   By: Ulyses Jarred M.D.   On: 01/22/2022 21:48

## 2022-01-23 NOTE — Progress Notes (Addendum)
Patient ID: Joshua Wyatt, male   DOB: Oct 07, 1946, 76 y.o.   MRN: [id number]  A/P: This is a patient with left parietal MCA branch infarct and cardioembolic CVAs from known A-fib.  He was sitting in the chair and acutely slumped over after being given isosorbide at 5:21. he was diaphoretic and dizzy.  Per nurse his sBP was 108 while sitting prior to his event. On my exam he quickly returned to baseline.  NIH stroke scale 0. it was felt that his episode secondary to acute hypotensive episode.  Code stroke was canceled.  Discussed with nursing to allow permissive hypertension, adequate hydration.   stroke team will follow tomorrow.   Vitals:   01/23/22 1824 01/23/22 1840 01/23/22 2010 01/23/22 2144  BP: (!) 188/102 (!) 162/84 119/70 100/71  Pulse: 62 64 65 73  Resp: $Remo'10 16 18   'DHEeO$ Temp:   (!) 97.5 F (36.4 C)   TempSrc:   Oral   SpO2: 98% 99% 95%   Weight:      Height:       CBC:  Recent Labs  Lab 01/22/22 1709 01/22/22 1818 01/23/22 0400  WBC 8.5  --  9.2  NEUTROABS 6.1  --  6.1  HGB 13.7 14.6 13.9  HCT 42.6 43.0 43.3  MCV 92.4  --  91.5  PLT 254  --  409   Basic Metabolic Panel:  Recent Labs  Lab 01/22/22 1709 01/22/22 1818 01/23/22 0400  NA 136 136 136  K 4.5 4.5 4.1  CL 101 103 100  CO2 25  --  25  GLUCOSE 106* 100* 96  BUN 30* 31* 32*  CREATININE 2.10* 2.30* 1.90*  CALCIUM 9.8  --  9.8  MG  --   --  2.2    Recent Labs  Lab 01/23/22 0400  CHOL 306*  TRIG 467*  HDL 43  CHOLHDL 7.1  VLDL UNABLE TO CALCULATE IF TRIGLYCERIDE OVER 400 mg/dL  LDLCALC UNABLE TO CALCULATE IF TRIGLYCERIDE OVER 400 mg/dL    Recent Labs  Lab 01/23/22 0400  HGBA1C 5.4   Urine Drug Screen: No results for input(s): LABOPIA, COCAINSCRNUR, LABBENZ, AMPHETMU, THCU, LABBARB in the last 168 hours.  Alcohol Level No results for input(s): ETH in the last 168 hours.  IMAGING past 24 hours VAS US CAROTID  Result Date: 01/23/2022 Carotid Arterial Duplex Study Patient Name:  Joshua Wyatt   Date of Exam:   01/23/2022 Medical Rec #: [id number]      Accession #:    [id number] Date of Birth: 1946-05-20     Patient Gender: M Patient Age:   74 years Exam Location:  [facility] Procedure:      VAS US CAROTID Referring Phys: Deno [facility] --------------------------------------------------------------------------------  Indications:       CVA and Right stent. Risk Factors:      Hypertension, hyperlipidemia, coronary artery disease, PAD. Other Factors:     CABG x3, bilateral endarterectomies. Comparison Study:  MRA performed 01-22-2022 showed "loss of normal flow related                    enhancement within the proximal right ICA. Question prior ICA                    stent or other intervention."                     11-08-2021 Prior carotid duplex showed patent right ICA  stent, 1-39% left ICA stenosis, and bilateral ECA stenosis                    >50%. Performing Technologist: Darlin Coco RDMS, RVT  Examination Guidelines: A complete evaluation includes B-mode imaging, spectral Doppler, color Doppler, and power Doppler as needed of all accessible portions of each vessel. Bilateral testing is considered an integral part of a complete examination. Limited examinations for reoccurring indications may be performed as noted.  Right Carotid Findings: +----------+--------+--------+--------+------------------+--------+             PSV cm/s EDV cm/s Stenosis Plaque Description Comments  +----------+--------+--------+--------+------------------+--------+  CCA Prox   40       10                                             +----------+--------+--------+--------+------------------+--------+  CCA Distal 44       13                                             +----------+--------+--------+--------+------------------+--------+  ICA Prox                                                 Stent     +----------+--------+--------+--------+------------------+--------+  ICA Distal 34       12                                              +----------+--------+--------+--------+------------------+--------+  ECA        65       7                                              +----------+--------+--------+--------+------------------+--------+ +----------+--------+-------+----------------+-------------------+             PSV cm/s EDV cms Describe         Arm Pressure (mmHG)  +----------+--------+-------+----------------+-------------------+  Subclavian 80               Multiphasic, WNL                      +----------+--------+-------+----------------+-------------------+ +---------+--------+--+--------+--+---------+  Vertebral PSV cm/s 39 EDV cm/s 11 Antegrade  +---------+--------+--+--------+--+---------+  Right Stent(s): +---------------+--+--++++  Prox to Stent   44 13     +---------------+--+--++++  Proximal Stent  22 8      +---------------+--+--++++  Mid Stent       25 9      +---------------+--+--++++  Distal Stent    37 14     +---------------+--+--++++  Distal to Stent 44 17     +---------------+--+--++++   Left Carotid Findings: +----------+--------+--------+--------+------------------+--------+             PSV cm/s EDV cm/s Stenosis Plaque Description Comments  +----------+--------+--------+--------+------------------+--------+  CCA Prox   32       7                                              +----------+--------+--------+--------+------------------+--------+  CCA Distal 29       10                                             +----------+--------+--------+--------+------------------+--------+  ICA Prox   29       7                                              +----------+--------+--------+--------+------------------+--------+  ICA Distal 44       17                                             +----------+--------+--------+--------+------------------+--------+  ECA        110               >50%                                  +----------+--------+--------+--------+------------------+--------+  +----------+--------+--------+---------+-------------------+             PSV cm/s EDV cm/s Describe  Arm Pressure (mmHG)  +----------+--------+--------+---------+-------------------+  Subclavian 145               Turbulent                      +----------+--------+--------+---------+-------------------+ +---------+--------+--+--------+--+---------+  Vertebral PSV cm/s 55 EDV cm/s 13 Antegrade  +---------+--------+--+--------+--+---------+   Summary: Right Carotid: The ECA appears <50% stenosed. Patent right ICA stent. Left Carotid: Velocities in the left ICA are consistent with a 1-39% stenosis.               The ECA appears >50% stenosed. Vertebrals:  Bilateral vertebral arteries demonstrate antegrade flow. Subclavians: Left subclavian artery flow was disturbed. Normal flow hemodynamics              were seen in the right subclavian artery. *See table(s) above for measurements and observations.     Preliminary    VAS [place] TRANSCRANIAL DOPPLER (at [facility] and [facility] only)  Result Date: 01/23/2022  Transcranial Doppler Patient Name:  Joshua Wyatt  Date of Exam:   01/23/2022 Medical Rec #: [id number]      Accession #:    [id number] Date of Birth: 1946-11-21     Patient Gender: M Patient Age:   13 years Exam Location:  [facility] Procedure:      VAS [place] TRANSCRANIAL DOPPLER Referring Phys: Inda Merlin --------------------------------------------------------------------------------  Indications: Stroke. History: HTN, HLD, CAD s/p CABG, PAD, bilateral carotid endarterectomies, right ICA stent. Limitations for diagnostic windows: Unable to insonate right transtemporal window. Unable to insonate left transtemporal window. Comparison Study: No prior studies. Performing Technologist: Darlin Coco RDMS, RVT  Examination Guidelines: A complete evaluation includes B-mode imaging, spectral Doppler, color Doppler, and power Doppler as needed of all accessible portions of each vessel. Bilateral testing is considered an  integral part of a complete examination. Limited examinations for reoccurring indications may be performed as noted.  +----------+-------------+----------+-----------+------------------+  RIGHT TCD  Right VM (cm) Depth (cm) Pulsatility      Comment        +----------+-------------+----------+-----------+------------------+  MCA                                             Unable to insonate  +----------+-------------+----------+-----------+------------------+  ACA                                             Unable to insonate  +----------+-------------+----------+-----------+------------------+  Term ICA                                        Unable to insonate  +----------+-------------+----------+-----------+------------------+  PCA                                             Unable to insonate  +----------+-------------+----------+-----------+------------------+  Opthalmic      15.00                   0.72                         +----------+-------------+----------+-----------+------------------+  ICA siphon     31.00                   1.15                         +----------+-------------+----------+-----------+------------------+  Vertebral     -30.00                   1.02                         +----------+-------------+----------+-----------+------------------+  +----------+------------+----------+-----------+------------------+  LEFT TCD   Left VM (cm) Depth (cm) Pulsatility      Comment        +----------+------------+----------+-----------+------------------+  MCA                                            Unable to insonate  +----------+------------+----------+-----------+------------------+  ACA                                            Unable to insonate  +----------+------------+----------+-----------+------------------+  Term ICA                                       Unable to insonate  +----------+------------+----------+-----------+------------------+  PCA                                             Unable to insonate  +----------+------------+----------+-----------+------------------+  Opthalmic     11.00  1.01                         +----------+------------+----------+-----------+------------------+  ICA siphon    14.00                   0.67                         +----------+------------+----------+-----------+------------------+  Vertebral     -32.00                  1.00                         +----------+------------+----------+-----------+------------------+  +------------+------+------------------+               VM cm       Comment        +------------+------+------------------+  Prox Basilar -34.00                     +------------+------+------------------+  Dist Basilar        Unable to insonate  +------------+------+------------------+    Preliminary     PHYSICAL EXAM General - some distress. Appears diaphoretic.     Mental Status -  Level of arousal and orientation to time, place, and person were intact. Slightly agitated. Language including expression, naming, repetition, comprehension was assessed and found intact                    Cranial Nerves II - XII - II - Visual field intact OU. No field cut. III, IV, VI - Extraocular movements intact        V - Facial sensation intact bilaterally VII - Facial movement intact bilaterally VIII - Hearing & vestibular intact bilaterally X - Palate elevates symmetrically XI - Chin turning & shoulder shrug intact bilaterally XII - Tongue protrusion intact   Motor Strength - The patients strength was normal in all extremities and pronator drift was absent.  Bulk was normal and fasciculations were absent Motor Tone - Muscle tone was assessed at the neck and appendages and was normal   Reflexes - The patients reflexes were symmetrical in all extremities and he had no pathological reflexes   Sensory - Light touch, temperature/pinprick were assessed and were symmetrical   Coordination - The patient had normal  movements in the hands and feet with no ataxia or dysmetria.  Tremor was absent   Gait and Station - deferred.

## 2022-01-23 NOTE — Assessment & Plan Note (Signed)
·   Holding home regimen of Eliquis as mentioned above  Continuing home regimen of amiodarone  Patient currently rate controlled  Monitoring patient on telemetry

## 2022-01-23 NOTE — Progress Notes (Signed)
Chaplain responded to Code Blue which was cancelled just as Chaplain entered the unit.   Pt was receiving care from a team of medical personnel so Chaplain did not enter room. Chaplain available if needed.  De Burrs

## 2022-01-23 NOTE — Progress Notes (Addendum)
Upon entering room when ED dropped off pt, he was up eating dinner in the chair at bedside. VS being taken by NT. Pt stated feeling dizzy and diaphoretic and shortly after went unresponsive and slumped over bedside table. RRT called, pt moved from chair to bed by myself and other RN. Shortly after moving, pt slowly became more responsive and VSS. Neuro and RRT at bedside at that time. NIH 0.

## 2022-01-23 NOTE — Code Documentation (Signed)
Code stroke called on this patient, simultaneously Code blue was called. Patient had just arrived from the ED, was sitting in the chair eating his dinner when RN noted that he was slumped over and unresponsive with a pulse. Upon stroke team arrival patient in bed with NIHSS 0 and at baseline with a BP of 188/102. Code stroke cancelled, discussed with bedside RN.

## 2022-01-23 NOTE — Assessment & Plan Note (Signed)
•   No clinical evidence of cardiogenic volume overload ° °

## 2022-01-23 NOTE — Assessment & Plan Note (Signed)
·   Permissive hypertension for now  Holding home regimen of oral antihypertensives  As needed intravenous intermittent is for markedly elevated blood pressure of greater than 220/115

## 2022-01-23 NOTE — ED Notes (Signed)
Pt transported to room 4. Pt care assumed at this time.

## 2022-01-23 NOTE — Assessment & Plan Note (Signed)
Continuing home regimen of daily PPI therapy.  

## 2022-01-23 NOTE — H&P (Signed)
History and Physical    Patient: Joshua Wyatt MRN: [id number] [place]: 01/22/2022  Date of Service: the patient was seen and examined on 01/23/2022  Patient coming from: Home  Chief Complaint:  Right arm weakness   HPI:    76 year old male with past medical history of coronary artery disease (s/p DES-RCA in 2019, s/p CABG x3 10/2021), S/P bilateral carotid endarterectomies and subsequent stenting of the right coronary artery,  moderate aortic stenosis, systolic and diastolic congestive heart failure (Echo 12/2021 40-45% with G1DD), hypertension, hyperlipidemia, chronic kidney disease stage IIIb (baseline creatinine 1.8-2.2), paroxysmal atrial fibrillation on Eliquis, gastroesophageal reflux disease, hyperlipidemia who presents to [facility] emergency department with complaints of right upper extremity weakness.  Patient explains that at approximately 10:30 PM yesterday evening he began to experience weakness in his right arm.  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  Patient denies any associated headaches, slurring of his speech, facial droop or changes in balance.  The following morning upon waking from bed patient noticed that his right arm was still weak.  Patient presented to see his primary care provider along with his caregiver and upon initially being seen at the PCP office was told to immediately go to [facility] emergency department for urgent evaluation due to concern for stroke.  Upon evaluation in the emergency department there was clinical concern for stroke.  tPA was not administered.  Noncontrast CT imaging of the head was unremarkable.  ER provider discussed case with Dr. Lorrin Goodell who agreed to see the patient in consultation.  The hospitalist group was then called to assess the patient for admission to the hospital.  Review of Systems: Review of Systems  Neurological:  Positive for focal weakness.  All  other systems reviewed and are negative.   Past Medical History:  Diagnosis Date   Arthritis    "LITTLE IN MY LEFT HAND" (05/12/2018)   CAD (coronary artery disease)    a. cath 02/24/18 - 90% ost RCA; 40% ost & pro LAD   Deviated septum    RIGHT   GERD (gastroesophageal reflux disease)    History of kidney stones    Hyperlipemia    Hypertension 03/30/2013   PV Angiogram- rt. renal artery stent was widely open,50-60% lt. renal artery stenosis,lt. SFA stents patent with high grade above the knee  poplital stenosis   Normal cardiac stress test 03/25/2013   EF 60%, normal stress test ,this is a presurgical  test   PVD (peripheral vascular disease) ([place])    A. 2009: S/P PTA/stent to D SFA. B. 01/2017: angiosculpt atherectomy/drug-eluting balloon angioplasty to L SFA.   Renal artery stenosis ([place]) 03/02/2013   renal artery doppler-this was an abnormal doppler    Past Surgical History:  Procedure Laterality Date   APPENDECTOMY  1960's   CAROTID ENDARTERECTOMY Bilateral 2000's   CORONARY ANGIOPLASTY WITH STENT PLACEMENT  03/13/2018   CORONARY ARTERY BYPASS GRAFT N/A 10/27/2021   Procedure: CORONARY ARTERY BYPASS GRAFTING (CABG) X THREE ON PUMP USING LEFT INTERNAL MAMMARY ARTERY AND RIGHT ENDOSCOPIC GREATER SAPHENOUS VEIN CONDUITS;  Surgeon: [name], MD;  Location: [place];  Service: Open Heart Surgery;  Laterality: N/A;   CORONARY STENT INTERVENTION N/A 03/13/2018   Procedure: CORONARY STENT INTERVENTION;  Surgeon: Lorretta Harp, MD;  Location: [place] [facility];  Service: Cardiovascular;  Laterality: N/A;   CYSTOSCOPY W/ STONE MANIPULATION  "X 4 or 5"  ENDOVEIN HARVEST OF GREATER SAPHENOUS VEIN Right 10/27/2021   Procedure: ENDOVEIN HARVEST OF GREATER SAPHENOUS VEIN;  Surgeon: [name], MD;  Location: [place];  Service: Open Heart Surgery;  Laterality: Right;   LEFT HEART CATH AND CORONARY ANGIOGRAPHY N/A 02/24/2018   Procedure: LEFT HEART CATH AND CORONARY  ANGIOGRAPHY;  Surgeon: Lorretta Harp, MD;  Location: [place] [facility];  Service: Cardiovascular;  Laterality: N/A;   LEFT HEART CATH AND CORONARY ANGIOGRAPHY N/A 10/26/2021   Procedure: LEFT HEART CATH AND CORONARY ANGIOGRAPHY;  Surgeon: Lorretta Harp, MD;  Location: [place] [facility];  Service: Cardiovascular;  Laterality: N/A;   LITHOTRIPSY  "several"   LOWER EXTREMITY ANGIOGRAM N/A 10/11/2014   Procedure: LOWER EXTREMITY ANGIOGRAM;  Surgeon: Lorretta Harp, MD;  Location: [facility] CATH LAB;  Service: Cardiovascular;  Laterality: N/A;   LOWER EXTREMITY ANGIOGRAPHY N/A 02/04/2017   Procedure: Lower Extremity Angiography;  Surgeon: Lorretta Harp, MD;  Location: [place] [facility];  Service: Cardiovascular;  Laterality: N/A;   LOWER EXTREMITY ANGIOGRAPHY N/A 05/12/2018   Procedure: LOWER EXTREMITY ANGIOGRAPHY;  Surgeon: Lorretta Harp, MD;  Location: [place] [facility];  Service: Cardiovascular;  Laterality: N/A;   PERCUTANEOUS STENT INTERVENTION  10/11/2014   Procedure: PERCUTANEOUS STENT INTERVENTION;  Surgeon: Lorretta Harp, MD;  Location: [facility] CATH LAB;  Service: Cardiovascular;;  right sfax2   PERIPHERAL VASCULAR ATHERECTOMY  05/12/2018   Procedure: PERIPHERAL VASCULAR ATHERECTOMY;  Surgeon: Lorretta Harp, MD;  Location: [place] [facility];  Service: Cardiovascular;;  left SFA   PERIPHERAL VASCULAR CATHETERIZATION N/A 01/03/2017   Procedure: Carotid PTA/Stent Intervention / Right;  Surgeon: Lorretta Harp, MD;  Location: [place] [facility];  Service: Cardiovascular;  Laterality: N/A;   PERIPHERAL VASCULAR INTERVENTION Left 02/04/2017   Procedure: Peripheral Vascular Intervention;  Surgeon: Lorretta Harp, MD;  Location: [place] [facility];  Service: Cardiovascular;  Laterality: Left;  left SFA   POPLITEAL ARTERY ANGIOPLASTY Left 03/30/2013   TEE WITHOUT CARDIOVERSION N/A 10/27/2021   Procedure: TRANSESOPHAGEAL ECHOCARDIOGRAM (TEE);  Surgeon: [name], MD;  Location:  [place];  Service: Open Heart Surgery;  Laterality: N/A;   TONSILLECTOMY  1960's   TOTAL KNEE ARTHROPLASTY Right 05/06/2019   Procedure: TOTAL KNEE ARTHROPLASTY;  Surgeon: Latanya Maudlin, MD;  Location: [facility];  Service: Orthopedics;  Laterality: Right;  156min   TOTAL KNEE ARTHROPLASTY Left 08/29/2021   Procedure: TOTAL KNEE ARTHROPLASTY;  Surgeon: Paralee Cancel, MD;  Location: [facility];  Service: Orthopedics;  Laterality: Left;    Social History:  reports that he quit smoking about 8 years ago. His smoking use included cigars and cigarettes. He has a 58.00 pack-year smoking history. He has never used smokeless tobacco. He reports that he does not currently use alcohol. He reports that he does not use drugs.  Allergies  Allergen Reactions   Statins Swelling and Other (See Comments)    Muscle pain, also    Family History  Problem Relation Age of Onset   Heart disease Father        CABG   Healthy Sister    Healthy Brother     Prior to Admission medications   Medication Sig Start Date End Date Taking? Authorizing Provider  acetaminophen (TYLENOL) 500 MG tablet Take 500-1,000 mg by mouth every 6 (six) hours as needed for moderate pain (for pain).    [provider]  amiodarone (PACERONE) 200 MG tablet Take 0.5 tablets (100 mg total)  by mouth daily. 01/07/22   Kroeger, Ovidio Kin., PA-C  apixaban (ELIQUIS) 5 MG TABS tablet Take 1 tablet (5 mg total) by mouth 2 (two) times daily. 11/03/21   Leary Roca, PA-C  aspirin EC 81 MG tablet Take 81 mg by mouth in the morning. Swallow whole.    [provider]  carvedilol (COREG) 6.25 MG tablet Take 1 tablet (6.25 mg total) by mouth 2 (two) times daily with a meal. 01/07/22   Kroeger, Ovidio Kin., PA-C  ezetimibe (ZETIA) 10 MG tablet Take 1 tablet (10 mg total) by mouth daily. 11/10/21   Runell Gess, MD  furosemide (LASIX) 40 MG tablet Take 1 tablet (40 mg total) by mouth daily. 01/07/22   Kroeger, Ovidio Kin., PA-C  hydrALAZINE  (APRESOLINE) 25 MG tablet Take 1 tablet (25 mg total) by mouth every 8 (eight) hours. 01/07/22   Kroeger, Ovidio Kin., PA-C  isosorbide dinitrate (ISORDIL) 10 MG tablet Take 1 tablet (10 mg total) by mouth 3 (three) times daily. 01/07/22   Kroeger, Ovidio Kin., PA-C  nitroGLYCERIN (NITROSTAT) 0.4 MG SL tablet PLACE 1 TABLET UNDER THE TONGUE  EVERY  5  MINUTES  AS  NEEDED  FOR  CHEST  PAIN Patient taking differently: Place 0.4 mg under the tongue every 5 (five) minutes as needed for chest pain. 10/24/21   Runell Gess, MD  pantoprazole (PROTONIX) 40 MG tablet Take 1 tablet by mouth once daily Patient taking differently: 40 mg daily. 09/13/21   Runell Gess, MD  polyethylene glycol (MIRALAX / GLYCOLAX) 17 g packet Take 17 g by mouth daily as needed for mild constipation. 08/30/21   Cassandria Anger, PA-C  REPATHA PUSHTRONEX SYSTEM 420 MG/3.5ML SOCT Inject 420 mg into the skin every 30 (thirty) days. 10/02/21   [provider]  tiZANidine (ZANAFLEX) 2 MG tablet Take 1 tablet (2 mg total) by mouth 2 (two) times daily as needed for muscle spasms. 01/07/22   Kroeger, Ovidio Kin., PA-C  traZODone (DESYREL) 50 MG tablet Take 0.5 tablets (25 mg total) by mouth at bedtime as needed for sleep. Please contact your PCP for refills 01/07/22   Beatriz Stallion., PA-C    Physical Exam:  Vitals:   01/23/22 0755 01/23/22 0815 01/23/22 0900 01/23/22 0945  BP:  137/76 (!) 155/87 (!) 168/94  Pulse:  (!) 57 (!) 57 (!) 57  Resp:   12 10  Temp:      TempSrc:      SpO2:  96% 95% 96%  Weight: 74.4 kg     Height: 5\' 7"  (1.702 m)       Constitutional: Awake alert and oriented x3, no associated distress.   Skin: no rashes, no lesions, good skin turgor noted. Eyes: Pupils are equally reactive to light.  No evidence of scleral icterus or conjunctival pallor.  ENMT: Moist mucous membranes noted.  Posterior pharynx clear of any exudate or lesions.   Neck: normal, supple, no masses, no thyromegaly.  No evidence  of jugular venous distension.   Respiratory: clear to auscultation bilaterally, no wheezing, no crackles. Normal respiratory effort. No accessory muscle use.  Cardiovascular: Regular rate and rhythm, no murmurs / rubs / gallops. No extremity edema. 2+ pedal pulses. No carotid bruits.  Chest:   Nontender without crepitus or deformity.   Back:   Nontender without crepitus or deformity. Abdomen: Abdomen is soft and nontender.  No evidence of intra-abdominal masses.  Positive bowel sounds noted in all quadrants.  Musculoskeletal: No joint deformity upper and lower extremities. Good ROM, no contractures. Normal muscle tone.  Neurologic: Notable weakness of the right upper extremity in the proximal and distal muscle groups.  CN 2-12 grossly intact. Sensation intact.  Patient moving all 4 extremities spontaneously.  Patient is following all commands.  Patient is responsive to verbal stimuli.   Psychiatric: Patient exhibits normal mood with appropriate affect.  Patient seems to possess insight as to their current situation.    Data Reviewed:  I have personally reviewed and interpreted labs, imaging.  Significant findings are:  Noncontrast CT imaging of the head reveals no evidence of acute intracranial abnormality.   Creatinine 2.10.  EKG: Personally reviewed.  Rhythm is sinus bradycardia with heart rate of 55 bpm.  Evidence of LVH no dynamic ST segment changes appreciated.   Assessment and Plan: * Acute ischemic stroke ([place])- (present on admission) Patient presenting with right arm weakness since Sunday night Initial noncontrast CT imaging of the brain unremarkable for acute infarct Performing serial neurologic checks Monitoring patient on telemetry Holding home regimen of Eliquis for 48 hours per neurology recommendations and instead placing patient on aspirin On Zetia, patient allergic to statin therapy Further imaging to include: MRA of the head and neck as well as carotid  ultrasound Obtaining hemoglobin A1c and lipid panel in the morning Echocardiogram in the morning PT, OT, SLP evaluation Permissive hypertension with as needed antihypertensives only to be given if blood pressure greater than 220/115 Neurology following in consultation.   Chronic combined systolic and diastolic congestive heart failure (HCC)- (present on admission) No clinical evidence of cardiogenic volume overload   Coronary artery disease involving native coronary artery of native heart without angina pectoris- (present on admission) Patient is currently chest pain free Monitoring patient on telemetry Continue home regimen of antiplatelet therapy, Zetia and AV nodal blocking therapy   Paroxysmal atrial fibrillation (HCC)- (present on admission) Holding home regimen of Eliquis as mentioned above Continuing home regimen of amiodarone Patient currently rate controlled Monitoring patient on telemetry  Chronic kidney disease, stage 3b ([place])- (present on admission) Strict intake and output monitoring Creatinine near baseline Minimizing nephrotoxic agents as much as possible Serial chemistries to monitor renal function and electrolytes   Essential hypertension- (present on admission) Permissive hypertension for now Holding home regimen of oral antihypertensives As needed intravenous intermittent is for markedly elevated blood pressure of greater than 220/115   Mixed hyperlipidemia- (present on admission) Continue home regimen of Zetia Patient is allergic to statins Follow-up on lipid panel morning  GERD without esophagitis- (present on admission) Continuing home regimen of daily PPI therapy.     Code Status:  Full code  code status decision has been confirmed with: patient Family Communication: deferred   Consults: Dr. Lorrin Goodell with Neurology  Severity of Illness:  The appropriate patient status for this patient is INPATIENT. Inpatient status is judged to be  reasonable and necessary in order to provide the required intensity of service to ensure the patient's safety. The patient's presenting symptoms, physical exam findings, and initial radiographic and laboratory data in the context of their chronic comorbidities is felt to place them at high risk for further clinical deterioration. Furthermore, it is not anticipated that the patient will be medically stable for discharge from the hospital within 2 midnights of admission.   * I certify that at the point of admission it is my clinical judgment that the patient will require inpatient hospital care spanning beyond 2 midnights from the point  of admission due to high intensity of service, high risk for further deterioration and high frequency of surveillance required.*  Author:  Vernelle Emerald MD  01/23/2022 10:30 AM

## 2022-01-23 NOTE — Evaluation (Signed)
Physical Therapy Evaluation Patient Details Name: Joshua Wyatt MRN: [id number] DOB: [birth year] Today's Date: 01/23/2022  History of Present Illness  The pt is a 76 yo male presenting 2/13 with R sided weakness.  MRI with multifocal acute ischemia L>R, per neurology embolic looking CVAs.   PMH includes: L TKA (9/22), R TKA (5/20), CAD, CKD III, HTN, HLD, afib, HFrEF, mild-moderate AS, and PAD.  Clinical Impression  Patient presents with the problem above and impairments below. Patient required supervision to min assist for functional mobility. Patient had several LOB with ambulating in hallway and during DGI tasks and required min assist for steadying to regain balance. Patient would benefit from outpatient rehab for balance and strength deficits present. Patient educated on sit to stands and marching in place for a home exercise program as well as to continue his regular walking program at the gym with breaks as needed. Pt educated to use single point cane for safety during ambulating initially upon D/C. PT will continue to follow acutely.        Recommendations for follow up therapy are one component of a multi-disciplinary discharge planning process, led by the attending physician.  Recommendations may be updated based on patient status, additional functional criteria and insurance authorization.  Follow Up Recommendations Outpatient PT (pt most likely will refuse recommendation)    Assistance Recommended at Discharge Intermittent Supervision/Assistance  Patient can return home with the following  A little help with walking and/or transfers;Assistance with cooking/housework;Assist for transportation;Help with stairs or ramp for entrance    Equipment Recommendations None recommended by PT (pt educated on using cane at home and around community, especially initially after D/C)  Recommendations for Other Services       Functional Status Assessment Patient has had a recent decline in their  functional status and demonstrates the ability to make significant improvements in function in a reasonable and predictable amount of time.     Precautions / Restrictions Precautions Precautions: Fall Restrictions Weight Bearing Restrictions: No      Mobility  Bed Mobility Overal bed mobility: Needs Assistance Bed Mobility: Supine to Sit, Sit to Supine     Supine to sit: Min guard Sit to supine: Min guard   General bed mobility comments: for safety    Transfers Overall transfer level: Needs assistance Equipment used: None Transfers: Sit to/from Stand Sit to Stand: Min guard           General transfer comment: for safety from elevated ED stretcher    Ambulation/Gait Ambulation/Gait assistance: Min guard, Min assist Gait Distance (Feet): 200 Feet Assistive device: None Gait Pattern/deviations: Decreased step length - right, Decreased step length - left, Decreased stride length Gait velocity: decreased Gait velocity interpretation: 1.31 - 2.62 ft/sec, indicative of limited community ambulator Pre-gait activities: marching General Gait Details: pt needed min guard assist up to min assist for walking secondary to several LOB with DGI tasks. Educated on use of cane at home to Research officer, political party Rankin (Stroke Patients Only) Modified Rankin (Stroke Patients Only) Pre-Morbid Rankin Score: No significant disability Modified Rankin: Moderately severe disability     Balance Overall balance assessment: Needs assistance Sitting-balance support: No upper extremity supported, Feet supported Sitting balance-Leahy Scale: Good Sitting balance - Comments: able to maintain balance with no sway sitting EOB with no UE support   Standing balance support: No upper extremity supported, During functional activity Standing  balance-Leahy Scale: Fair Standing balance comment: mild sway upon standing                  Standardized Balance Assessment Standardized Balance Assessment : Dynamic Gait Index   Dynamic Gait Index Level Surface: Moderate Impairment Change in Gait Speed: Mild Impairment Gait with Horizontal Head Turns: Moderate Impairment Gait with Vertical Head Turns: Moderate Impairment Gait and Pivot Turn: Mild Impairment Step Over Obstacle: Mild Impairment Step Around Obstacles: Mild Impairment       Pertinent Vitals/Pain Pain Assessment Pain Assessment: Faces Faces Pain Scale: Hurts little more Pain Location: low back pain Pain Descriptors / Indicators: Aching Pain Intervention(s): Limited activity within patient's tolerance, Monitored during session    Home Living Family/patient expects to be discharged to:: Private residence Living Arrangements: Alone Available Help at Discharge: Family;Available PRN/intermittently Type of Home: House Home Access: Stairs to enter Entrance Stairs-Rails: Left Entrance Stairs-Number of Steps: 3 Alternate Level Stairs-Number of Steps: 12 Home Layout: Two level Home Equipment: Cane - single point;Rolling Walker (2 wheels) Additional Comments: pt has family support nearby. Has had TKA since CABG and had not yet finished OP PT following knee replacement. 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    Prior Function Prior Level of Function : Independent/Modified Independent;Driving             Mobility Comments: independent, enjoys golfing; intermittently uses cane (plays bass, guitar and vocals) ADLs Comments: independent     Hand Dominance   Dominant Hand: Right    Extremity/Trunk Assessment   Upper Extremity Assessment Upper Extremity Assessment: Defer to OT evaluation RUE Deficits / Details: mild weakness proximally 3+/5 MMT, decreased FMC RUE Sensation: decreased light touch RUE Coordination: decreased fine motor    Lower Extremity Assessment Lower Extremity Assessment: Generalized weakness;RLE  deficits/detail;LLE deficits/detail RLE Deficits / Details: all 5/5 RLE except 4/5 DF and knee flexors LLE Deficits / Details: all 5/5 LLE except 4/5 knee flexors    Cervical / Trunk Assessment Cervical / Trunk Assessment: Normal (does c/o of back stiffness and pain upon standing up)  Communication   Communication: No difficulties  Cognition Arousal/Alertness: Awake/alert Behavior During Therapy: WFL for tasks assessed/performed (very pleasant) Overall Cognitive Status: Within Functional Limits for tasks assessed                                 General Comments: intact        General Comments General comments (skin integrity, edema, etc.): Performed 5x sit to stand in 14 seconds without use of arms; used straight back chair. Indicative of high fall risk    Exercises General Exercises - Lower Extremity Hip Flexion/Marching: AROM, 5 reps, Both, Standing   Assessment/Plan    PT Assessment Patient needs continued PT services  PT Problem List Decreased strength;Decreased activity tolerance;Decreased balance;Decreased mobility;Decreased coordination;Decreased safety awareness       PT Treatment Interventions DME instruction;Gait training;Stair training;Functional mobility training;Therapeutic activities;Therapeutic exercise;Balance training;Neuromuscular re-education;Patient/family education    PT Goals (Current goals can be found in the Care Plan section)  Acute Rehab PT Goals Patient Stated Goal: to go home, play golf, play/make musci PT Goal Formulation: With patient Time For Goal Achievement: 02/06/22 Potential to Achieve Goals: Good Additional Goals Additional Goal #1: Pt to perform 5 times sit to stand in 12 seconds without UE support to decrease fall risk    Frequency Min 4X/week  Co-evaluation               AM-PAC PT "6 Clicks" Mobility  Outcome Measure Help needed turning from your back to your side while in a flat bed without using  bedrails?: None Help needed moving from lying on your back to sitting on the side of a flat bed without using bedrails?: None Help needed moving to and from a bed to a chair (including a wheelchair)?: None Help needed standing up from a chair using your arms (e.g., wheelchair or bedside chair)?: A Little Help needed to walk in hospital room?: A Little Help needed climbing 3-5 steps with a railing? : A Little 6 Click Score: 21    End of Session Equipment Utilized During Treatment: Gait belt Activity Tolerance: Patient tolerated treatment well Patient left: in bed;with call bell/phone within reach;Other (comment) (on stretcher in ED) Nurse Communication: Mobility status PT Visit Diagnosis: Other abnormalities of gait and mobility (R26.89);Muscle weakness (generalized) (M62.81);Unsteadiness on feet (R26.81)    Time: 1206-1221 PT Time Calculation (min) (ACUTE ONLY): 15 min   Charges:   PT Evaluation $PT Eval Low Complexity: 1 Low          Jonne Ply, SPT   [place] 01/23/2022, 1:37 PM

## 2022-01-24 ENCOUNTER — Ambulatory Visit (HOSPITAL_COMMUNITY): Payer: Medicare Other

## 2022-01-24 LAB — BASIC METABOLIC PANEL
Anion gap: 12 (ref 5–15)
BUN: 40 mg/dL — ABNORMAL HIGH (ref 8–23)
CO2: 23 mmol/L (ref 22–32)
Calcium: 9.7 mg/dL (ref 8.9–10.3)
Chloride: 101 mmol/L (ref 98–111)
Creatinine, Ser: 2.22 mg/dL — ABNORMAL HIGH (ref 0.61–1.24)
GFR, Estimated: 30 mL/min — ABNORMAL LOW (ref >60)
Glucose, Bld: 111 mg/dL — ABNORMAL HIGH (ref 70–99)
Potassium: 4.5 mmol/L (ref 3.5–5.1)
Sodium: 136 mmol/L (ref 135–145)

## 2022-01-24 LAB — MAGNESIUM: Magnesium: 2.3 mg/dL (ref 1.7–2.4)

## 2022-01-24 MED ORDER — LACTATED RINGERS IV SOLN: INTRAVENOUS | Status: AC

## 2022-01-24 MED ORDER — LACTATED RINGERS IV SOLN
INTRAVENOUS | Status: DC
Start: 2022-01-24 — End: 2022-01-24

## 2022-01-24 MED ORDER — FUROSEMIDE 40 MG PO TABS: 40.0000 mg | ORAL_TABLET | Freq: Every day | ORAL | Status: DC

## 2022-01-24 MED ORDER — ISOSORBIDE DINITRATE 10 MG PO TABS
10.0000 mg | ORAL_TABLET | Freq: Three times a day (TID) | ORAL | Status: DC
  Administered 2022-01-25: 10 mg via ORAL
  Filled 2022-01-24 (×3): qty 1

## 2022-01-24 MED ORDER — LACTATED RINGERS IV SOLN: INTRAVENOUS | Status: DC

## 2022-01-24 MED ORDER — LACTATED RINGERS IV BOLUS
500.0000 mL | Freq: Once | INTRAVENOUS | Status: AC
  Administered 2022-01-24: 500 mL via INTRAVENOUS

## 2022-01-24 NOTE — Progress Notes (Addendum)
Occupational Therapy Treatment Patient Details Name: Joshua Wyatt MRN: [id number] DOB: [birth year] Today's Date: 01/24/2022   History of present illness The pt is a 76 yo male presenting 2/13 with R sided weakness.  MRI with multifocal acute ischemia L>R, per neurology embolic looking CVAs.   PMH includes: L TKA (9/22), R TKA (5/20), CAD, CKD III, HTN, HLD, afib, HFrEF, mild-moderate AS, and PAD.   OT comments  Patient supine in bed and agreeable to OT session.  Pt progressing with mobility in room given min guard only, no losses of balance noted; LB dressing with min guard and grooming standing at sink with supervision.  He requires cueing to utilize R hand functionally and presents with frustration with he looses grasp on toothbrush in R hand.  Completed short blessed test, revealing deficits in attention, sequencing and recall- scoring 11/28; pt with decreased awareness to deficits and poor acceptance to recommendations.  Noted attention fading further with exercises to R UE when fatigue increases. Reviewed recommendations for assistance with meds, cooking and driving at dc- pt reports he has a brother and friend who can help, but doesn't think he needs any assist.  Pt continues to decline OP therapy, reports he would prefer to complete at home.  Will follow acutely, plan for pill box assessment next session.   BP supine: 114/68 Sitting: 152/83  Standing: 127/79 Standing x 3 min: 141/80   Mild dizziness in standing, but reports improved with prolonged standing   Recommendations for follow up therapy are one component of a multi-disciplinary discharge planning process, led by the attending physician.  Recommendations may be updated based on patient status, additional functional criteria and insurance authorization.    Follow Up Recommendations  Home health OT (pt declining OP OT)    Assistance Recommended at Discharge Intermittent Supervision/Assistance (assist with medication, cooking and  driving)  Patient can return home with the following  A little help with walking and/or transfers;A little help with bathing/dressing/bathroom;Assistance with cooking/housework;Direct supervision/assist for medications management;Direct supervision/assist for financial management;Assist for transportation   Equipment Recommendations  Tub/shower seat    Recommendations for Other Services      Precautions / Restrictions Precautions Precautions: Fall Restrictions Weight Bearing Restrictions: No       Mobility Bed Mobility Overal bed mobility: Needs Assistance Bed Mobility: Supine to Sit, Sit to Supine     Supine to sit: Supervision Sit to supine: Supervision   General bed mobility comments: HOB elevated    Transfers Overall transfer level: Needs assistance Equipment used: None Transfers: Sit to/from Stand Sit to Stand: Min guard                 Balance Overall balance assessment: Needs assistance Sitting-balance support: No upper extremity supported, Feet supported Sitting balance-Leahy Scale: Good     Standing balance support: During functional activity, Single extremity supported Standing balance-Leahy Scale: Fair Standing balance comment: preference to 1 UE support                           ADL either performed or assessed with clinical judgement   ADL Overall ADL's : Needs assistance/impaired     Grooming: Supervision/safety;Standing;Oral care               Lower Body Dressing: Min guard;Sit to/from stand   Toilet Transfer: Min guard;Ambulation           Functional mobility during ADLs: Min guard      Extremity/Trunk  Assessment              Vision       Perception     Praxis      Cognition Arousal/Alertness: Awake/alert Behavior During Therapy: WFL for tasks assessed/performed Overall Cognitive Status: Impaired/Different from baseline Area of Impairment: Attention, Memory, Safety/judgement, Awareness, Problem  solving                   Current Attention Level: Selective Memory: Decreased short-term memory   Safety/Judgement: Decreased awareness of safety, Decreased awareness of deficits Awareness: Emergent Problem Solving: Difficulty sequencing, Requires verbal cues General Comments: short blessed test with deficits in STM, attention and sequencing; scoring 11/28 (significant impairments).  Noted attention faded towards end of session as well due to fatigue increasing. Discussed concerns with pt, who voices "I don't need any help, I can to it all by myself".  Continued to reinforce recommendations of assist with meds, driving initally at dc.        Exercises Exercises: Other exercises Other Exercises Other Exercises: provided [facility] handout with cueing to complete first 5 exercises    Shoulder Instructions       General Comments BP assessed, SBP drop from EOB sitting to standing but recovered. Pt reports mild dizziness.    Pertinent Vitals/ Pain       Pain Assessment Pain Assessment: No/denies pain  Home Living     Available Help at Discharge: Family;Available PRN/intermittently Type of Home: House                                  Prior Functioning/Environment              Frequency  Min 2X/week        Progress Toward Goals  OT Goals(current goals can now be found in the care plan section)  Progress towards OT goals: Progressing toward goals  Acute Rehab OT Goals Patient Stated Goal: home OT Goal Formulation: With patient Time For Goal Achievement: 02/06/22 Potential to Achieve Goals: Good  Plan Frequency remains appropriate;Discharge plan needs to be updated    Co-evaluation                 AM-PAC OT "6 Clicks" Daily Activity     Outcome Measure   Help from another person eating meals?: A Little Help from another person taking care of personal grooming?: A Little Help from another person toileting, which includes using toliet,  bedpan, or urinal?: A Little Help from another person bathing (including washing, rinsing, drying)?: A Little Help from another person to put on and taking off regular upper body clothing?: A Little Help from another person to put on and taking off regular lower body clothing?: A Little 6 Click Score: 18    End of Session Equipment Utilized During Treatment: Gait belt  OT Visit Diagnosis: Other abnormalities of gait and mobility (R26.89);Other symptoms and signs involving the nervous system (R29.898)   Activity Tolerance Patient tolerated treatment well   Patient Left in bed;with call bell/phone within reach;with bed alarm set   Nurse Communication Mobility status        Time: 9244-6286 OT Time Calculation (min): 30 min  Charges: OT Treatments $Self Care/Home Management : 23-37 mins  Jolaine Artist, [place] Pager [phone] Office [phone]   Delight Stare 01/24/2022, 9:34 AM

## 2022-01-24 NOTE — Progress Notes (Addendum)
STROKE TEAM PROGRESS NOTE   SUBJECTIVE (INTERVAL HISTORY) He is seen with his brother at the bedside.  Pt had a simultaneous Code Stroke and Code Blue overnight in which he complained of dizziness and was found slumped in his chair, unresponsive but with a pulse. Returned to baseline after being placed back in bed.   This morning, he reports feeling better overall and believes yesterday's incident to be due to his hypotensive BP. He reports taking two medications prior to leaving the ED (Niacin and Isordil at 1721). He denies weakness, changes to speech or vision, and notes no facial symmetry changes. With patient's quick recovery, no repeat imaging was completed, and this was likely a syncopal episode.   CT head with no acute abnormality and chronic Small vessel disease. MRI with multifocal bilateral acute ischemia, left greater than right.  MRA with possible occlusion or stenosis of left vertebral artery V4 segment; loss of normal flow within the proximal right ICA, likely due to stent versus stenosis/occlusion.  OBJECTIVE CBC:  Recent Labs  Lab 01/22/22 1709 01/22/22 1818 01/23/22 0400  WBC 8.5  --  9.2  NEUTROABS 6.1  --  6.1  HGB 13.7 14.6 13.9  HCT 42.6 43.0 43.3  MCV 92.4  --  91.5  PLT 254  --  416    Basic Metabolic Panel:  Recent Labs  Lab 01/23/22 0400 01/24/22 0627  NA 136 136  K 4.1 4.5  CL 100 101  CO2 25 23  GLUCOSE 96 111*  BUN 32* 40*  CREATININE 1.90* 2.22*  CALCIUM 9.8 9.7  MG 2.2 2.3    Lipid Panel:  Recent Labs  Lab 01/23/22 0400  CHOL 306*  TRIG 467*  HDL 43  CHOLHDL 7.1  VLDL UNABLE TO CALCULATE IF TRIGLYCERIDE OVER 400 mg/dL  LDLCALC UNABLE TO CALCULATE IF TRIGLYCERIDE OVER 400 mg/dL    LDL cholesterol, direct       Component Ref Range & Units 04:00 3 yr ago 5 yr ago  Direct LDL 0 - 99 mg/dL 192.8 High   97  208 High  R, CM        HgbA1c:  Recent Labs  Lab 01/23/22 0400  HGBA1C 5.4    Urine Drug Screen: No results for input(s):  LABOPIA, COCAINSCRNUR, LABBENZ, AMPHETMU, THCU, LABBARB in the last 168 hours.  Alcohol Level No results for input(s): ETH in the last 168 hours.  IMAGING past 24 hours VAS US CAROTID  Result Date: 01/23/2022 Carotid Arterial Duplex Study Patient Name:  Joshua Wyatt  Date of Exam:   01/23/2022 Medical Rec #: [id number]      Accession #:    [id number] Date of Birth: 1946-03-04     Patient Gender: M Patient Age:   76 years Exam Location:  [facility] Procedure:      VAS US CAROTID Referring Phys: [facility] --------------------------------------------------------------------------------  Indications:       CVA and Right stent. Risk Factors:      Hypertension, hyperlipidemia, coronary artery disease, PAD. Other Factors:     CABG x3, bilateral endarterectomies. Comparison Study:  MRA performed 01-22-2022 showed "loss of normal flow related                    enhancement within the proximal right ICA. Question prior ICA                    stent or other intervention."  11-08-2021 Prior carotid duplex showed patent right ICA                    stent, 1-39% left ICA stenosis, and bilateral ECA stenosis                    >50%. Performing Technologist: Darlin Coco RDMS, RVT  Examination Guidelines: A complete evaluation includes B-mode imaging, spectral Doppler, color Doppler, and power Doppler as needed of all accessible portions of each vessel. Bilateral testing is considered an integral part of a complete examination. Limited examinations for reoccurring indications may be performed as noted.  Right Carotid Findings: +----------+--------+--------+--------+------------------+--------+             PSV cm/s EDV cm/s Stenosis Plaque Description Comments  +----------+--------+--------+--------+------------------+--------+  CCA Prox   40       10                                             +----------+--------+--------+--------+------------------+--------+  CCA Distal 44       13                                              +----------+--------+--------+--------+------------------+--------+  ICA Prox                                                 Stent     +----------+--------+--------+--------+------------------+--------+  ICA Distal 34       12                                             +----------+--------+--------+--------+------------------+--------+  ECA        65       7                                              +----------+--------+--------+--------+------------------+--------+ +----------+--------+-------+----------------+-------------------+             PSV cm/s EDV cms Describe         Arm Pressure (mmHG)  +----------+--------+-------+----------------+-------------------+  Subclavian 80               Multiphasic, WNL                      +----------+--------+-------+----------------+-------------------+ +---------+--------+--+--------+--+---------+  Vertebral PSV cm/s 39 EDV cm/s 11 Antegrade  +---------+--------+--+--------+--+---------+  Right Stent(s): +---------------+--+--++++  Prox to Stent   44 13     +---------------+--+--++++  Proximal Stent  22 8      +---------------+--+--++++  Mid Stent       25 9      +---------------+--+--++++  Distal Stent    37 14     +---------------+--+--++++  Distal to Stent 44 17     +---------------+--+--++++   Left Carotid Findings: +----------+--------+--------+--------+------------------+--------+             PSV cm/s  EDV cm/s Stenosis Plaque Description Comments  +----------+--------+--------+--------+------------------+--------+  CCA Prox   32       7                                              +----------+--------+--------+--------+------------------+--------+  CCA Distal 29       10                                             +----------+--------+--------+--------+------------------+--------+  ICA Prox   29       7                                              +----------+--------+--------+--------+------------------+--------+  ICA Distal 44        17                                             +----------+--------+--------+--------+------------------+--------+  ECA        110               >50%                                  +----------+--------+--------+--------+------------------+--------+ +----------+--------+--------+---------+-------------------+             PSV cm/s EDV cm/s Describe  Arm Pressure (mmHG)  +----------+--------+--------+---------+-------------------+  Subclavian 145               Turbulent                      +----------+--------+--------+---------+-------------------+ +---------+--------+--+--------+--+---------+  Vertebral PSV cm/s 55 EDV cm/s 13 Antegrade  +---------+--------+--+--------+--+---------+   Summary: Right Carotid: The ECA appears <50% stenosed. Patent right ICA stent. Left Carotid: Velocities in the left ICA are consistent with a 1-39% stenosis.               The ECA appears >50% stenosed. Vertebrals:  Bilateral vertebral arteries demonstrate antegrade flow. Subclavians: Left subclavian artery flow was disturbed. Normal flow hemodynamics              were seen in the right subclavian artery. *See table(s) above for measurements and observations.     Preliminary    VAS [place] TRANSCRANIAL DOPPLER (at [facility] and [facility] only)  Result Date: 01/23/2022  Transcranial Doppler Patient Name:  OVADIA LOPP  Date of Exam:   01/23/2022 Medical Rec #: [id number]      Accession #:    [id number] Date of Birth: 01-01-1946     Patient Gender: M Patient Age:   49 years Exam Location:  [facility] Procedure:      VAS [place] TRANSCRANIAL DOPPLER Referring Phys: Shauna Hugh --------------------------------------------------------------------------------  Indications: Stroke. History: HTN, HLD, CAD s/p CABG, PAD, bilateral carotid endarterectomies, right ICA stent. Limitations for diagnostic windows: Unable to insonate right transtemporal window. Unable to insonate left transtemporal window. Comparison Study: No prior studies. Performing  Technologist: Jean Rosenthal RDMS, RVT  Examination Guidelines: A complete evaluation includes B-mode imaging, spectral Doppler, color Doppler, and power Doppler as needed of all accessible portions of each vessel. Bilateral testing is considered an integral part of a complete examination. Limited examinations for reoccurring indications may be performed as noted.  +----------+-------------+----------+-----------+------------------+  RIGHT TCD  Right VM (cm) Depth (cm) Pulsatility      Comment        +----------+-------------+----------+-----------+------------------+  MCA                                             Unable to insonate  +----------+-------------+----------+-----------+------------------+  ACA                                             Unable to insonate  +----------+-------------+----------+-----------+------------------+  Term ICA                                        Unable to insonate  +----------+-------------+----------+-----------+------------------+  PCA                                             Unable to insonate  +----------+-------------+----------+-----------+------------------+  Opthalmic      15.00                   0.72                         +----------+-------------+----------+-----------+------------------+  ICA siphon     31.00                   1.15                         +----------+-------------+----------+-----------+------------------+  Vertebral     -30.00                   1.02                         +----------+-------------+----------+-----------+------------------+  +----------+------------+----------+-----------+------------------+  LEFT TCD   Left VM (cm) Depth (cm) Pulsatility      Comment        +----------+------------+----------+-----------+------------------+  MCA                                            Unable to insonate  +----------+------------+----------+-----------+------------------+  ACA                                            Unable to insonate   +----------+------------+----------+-----------+------------------+  Term ICA                                       Unable to insonate  +----------+------------+----------+-----------+------------------+  PCA                                            Unable to insonate  +----------+------------+----------+-----------+------------------+  Opthalmic     11.00                   1.01                         +----------+------------+----------+-----------+------------------+  ICA siphon    14.00                   0.67                         +----------+------------+----------+-----------+------------------+  Vertebral     -32.00                  1.00                         +----------+------------+----------+-----------+------------------+  +------------+------+------------------+               VM cm       Comment        +------------+------+------------------+  Prox Basilar -34.00                     +------------+------+------------------+  Dist Basilar        Unable to insonate  +------------+------+------------------+    Preliminary      PHYSICAL EXAM Temp:  [97.5 F (36.4 C)-98.6 F (37 C)] 98 F (36.7 C) (02/15 0746) Pulse Rate:  [57-86] 64 (02/15 0746) Resp:  [10-19] 12 (02/15 0746) BP: (100-188)/(70-102) 130/83 (02/15 0746) SpO2:  [95 %-100 %] 96 % (02/15 0746)  General - Well nourished, well developed, elderly Caucasian male in no apparent distress.  Cardiovascular - Regular rhythm and rate.  Mental Status -  Level of arousal and orientation to time, place, and person were intact. Language including expression, naming, repetition, comprehension was assessed and found intact without dysarthria Attention span and concentration were normal. Recent and remote memory were intact. Fund of Knowledge was assessed and was intact.  Cranial Nerves II - XII - II - Visual field intact OU. III, IV, VI - Extraocular movements intact. V - Facial sensation intact bilaterally. VII - Facial movement  intact bilaterally. VIII - Hearing & vestibular intact bilaterally. X - Palate elevates symmetrically. XI - Chin turning & shoulder shrug intact bilaterally. XII - Tongue protrusion intact.  Motor Strength - The patients strength was normal in all extremities and pronator drift was absent.  Strength remains decreased in the intrinsic muscles of the right hand as well as in wrist flexion.  Orbits left over right upper extremity.  Diminished fine finger movements on the right.  Otherwise normal in the right upper extremity and left upper extremity.  Bulk was normal and fasciculations were absent.   Motor Tone - Muscle tone was assessed at the neck and appendages and was normal.  Sensory - Light touch was assessed and  symmetrical.    Coordination - The patient had normal movements in the hands and feet with no ataxia or dysmetria. Tremor was absent.  Gait and Station - deferred.    ASSESSMENT/PLAN [name] is a 76 y.o. male with history of CAD,  PAF status post CABG, GERD, HTN, HLD, Peripheral vascular disease, PAF, s/p bilateral Carotid endarterectomy on Eliquis presenting with R arm weakness. This visit c/b Code Blue/Code Stroke called 2/14 PM when patient complained of dizziness then went non-responsive w/ pulse intact; deemed a syncopal episode, as patient was given Isordil 10 mg and returned to baseline once in bed. NIHSS 0 and BP 188/102 immediately after the episode.   Stroke:  bilateral infarcts of multiple sides, left greater than right likely secondary due to embolism from atrial fibrillation despite being on anticoagulation with Eliquis Code Stroke CT head No acute abnormality.  Chronic Small vessel disease.  MRI multifocal bilateral acute ischemia, left greater than right MRA possible occlusion or stenosis of left vertebral artery V4 segment; loss of normal flow within the proximal right ICA, likely due to stent versus stenosis/occlusion MRI cervical spine, severe right  C5-6 foraminal stenosis, moderate bilateral C4-C5 and left C3-4 neuroforaminal stenosis. Carotid Doppler  : Right ECA less than 50% stenosis; patent right ICA with stent.  Left ICA with 1 to 39% stenosis; left ECA greater than 50% stenosis Transcranial Doppler: Pending 2D Echo 01/06/2022: LVEF 40 to 45% with global hypokinesis of LV and concentric LVH; grade 1 diastolic dysfunction; mild to moderate MVR; tricuspid aortic valve with moderate calcification and thickening and moderate aortic valve stenosis LDL 192.8 HgbA1c 5.4 VTE prophylaxis -SCDs    Diet   Diet Heart Room service appropriate? Yes; Fluid consistency: Thin   aspirin 81 mg daily and Eliquis (apixaban) daily prior to admission, now on Eliquis (apixaban) daily.  Advised to discontinue aspirin Therapy recommendations: Outpatient PT, home health OT Disposition: Home  Hypertension Home meds: Coreg 6.25 mg twice daily, Lasix 40 mg, hydralazine 25 mg twice daily, isosorbide dinitrate 10 mg 3 times daily Stable Goal SBP < 160 but gradually normalize in 5-7 days Long-term BP goal normotensive  Hyperlipidemia Hypertriglyceridemia Home meds: Zetia 10 mg, resumed in hospital LDL 192.8, goal < 70 High intensity statin indicated but patient with allergies to statins (swelling, muscle pain)  Diabetes type II Controlled Home meds: Jardiance 10 mg HgbA1c 5.4, goal < 7.0 CBGs Recent Labs    01/22/22 1711 01/23/22 1825  GLUCAP 108* 113*     SSI  Other Stroke Risk Factors Advanced Age >/= 110  Former cigarette smoker, quit 2014 ETOH use, alcohol level No results found for requested labs within last 26280 hours., advised to drink no more than 1-2 drink(s) a day Substance abuse - UDS:  THC No results found for requested labs within last 26280 hours., Cocaine No results found for requested labs within last 26280 hours.. Patient advised to stop using due to stroke risk. Peripheral vascular disease: 2009: S/P PTA/stent to D SFA. B.  01/2017: angiosculpt atherectomy/drug-eluting balloon angioplasty to L SFA. Renal Artery Stenosis H/o Bilateral Carotid endarterectomy  CHF  Other Active Problems CAD: S/p DES-RCA in 2019, s/p CABG x3 (LIMA-LAD, SVG-OM, SVG-PDA) in 35/00 Combined systolic and diastolic heart failure Home meds: ASA, isosorbide dinitrate, carvedilol, Jardiance, Lasix, hydralazine, Repatha, Zetia Discontinue ASA due to increased bleeding risk Postoperative atrial fibrillation Home med: Eliquis 5 mg twice daily   Hospital day # 2    Rosezetta Schlatter, MD Stroke Neurology- Neuro Psych Resident 01/24/2022 9:11 AM  I have personally obtained history,examined this patient, reviewed notes, independently viewed imaging studies, participated in medical decision making and plan of care.ROS completed by me personally and pertinent positives fully documented  I have made any additions or clarifications directly  to the above note. Agree with note above. he likely had a near syncopal episode due to hypotension and has now recovered well. Continue eliquis for afib Stroke team will sign off.D/w patient, brother and Dr Candiss Norse.I have spent a total of  35  minutes with the patient reviewing hospital notes,  test results, labs and examining the patient as well as establishing an assessment and plan that was discussed personally with the patient.  > 50% of time was spent in direct patient care.      Antony Contras, MD Medical Director [facility] Pager: [phone] 01/24/2022 2:19 PM     To contact Stroke Continuity provider, please refer to [email or web address] After hours, contact General Neurology

## 2022-01-24 NOTE — Progress Notes (Signed)
PROGRESS NOTE                                                                                                                                                                                                             Patient Demographics:    Joshua Wyatt, is a 76 y.o. male, DOB - 06-05-1946, FYT:[id number]  Outpatient Primary MD for the patient is Janie Morning, DO    LOS - 2  Admit date - 01/22/2022    CC - R arm weakness  Brief Narrative (HPI from H&P)  76 year old male with past medical history of coronary artery disease (s/p DES-RCA in 2019, s/p CABG x3 10/2021), S/P bilateral carotid endarterectomies and subsequent stenting of the right coronary artery,  moderate aortic stenosis, systolic and diastolic congestive heart failure (Echo 12/2021 40-45% with G1DD), hypertension, hyperlipidemia, chronic kidney disease stage IIIb (baseline creatinine 1.8-2.2), paroxysmal atrial fibrillation on Eliquis, gastroesophageal reflux disease, hyperlipidemia who presents to [facility] emergency department with complaints of right upper extremity weakness for > 12hrs, was diagnosed with acute stroke seen by neurology and admitted to the hospital.   Subjective:   Patient in bed, appears comfortable, denies any headache, no fever, no chest pain or pressure, no shortness of breath , no abdominal pain. No new focal weakness.  Right arm weakness has improved however he feels little lightheaded when he stands up.   Assessment  & Plan :    Acute CVA ischemic versus embolic causing right arm weakness.  Seen by neurology team, defer management to them, MRI/MRA and recent echocardiogram noted, he was already on Eliquis however recommendations right now per the stroke team are to hold Eliquis and place him on aspirin along with Zetia, will add niacin for better LDL control he has severe statin allergy.  May require outpatient infusion with  Repatha for LDL management in the future.  Will get carotid ultrasound results pending, further work-up per stroke team, will defer switching back to Eliquis to stroke team.  Symptoms already much improved.  2.  Dyslipidemia with severe statin allergy.  Was on Zetia added niacin.  Outpatient Repatha infusions can be considered by PCP.  3.  History of CAD and chronic systolic heart failure EF 45%.  He is s/p CABG in November 2022 along with drug-eluting stent to RCA in 2019.  Currently no  acute issues and chest pain-free, continue Imdur, aspirin, low-dose beta-blocker if blood pressure stays stable.  Compensated from CHF standpoint.  4.  History of carotid endarterectomies.  Continue aspirin along with Zetia and niacin for secondary prevention, has severe statin allergy, carotid ultrasound results are pending will monitor.  5.  Paroxysmal atrial fibrillation [place] vas 2 score of greater than 6.  Currently Eliquis on hold per neurology recommendation.  Hold beta-blocker as blood pressure too low, continue aspirin for now Eliquis per stroke team.  Continue home dose amiodarone.  6.  Hypertension.  Permissive hypertension for now due to stroke.  Blood pressure too low, discontinued beta-blocker long-acting nitrate skeptic for today, IV fluids to proper blood pressure on 01/24/2022 and monitor.  7.  Mild AKI on CKD 4.  Baseline creatinine around 2.  AKI due to hypotension, hydrate and monitor.  8.  GERD.  On PPI.  9.  Moderate aortic valve stenosis.  Monitor with supportive care.      Condition - Fair  Family Communication  :  None present  Code Status :  Full  Consults  :  Neuro  PUD Prophylaxis : PPI   Procedures  :     Carotid US -  TTE - 01/06/22 -  1. Left ventricular ejection fraction, by estimation, is 40 to 45%. The left ventricle has mildly decreased function. The left ventricle demonstrates global hypokinesis. There is mild concentric left ventricular  hypertrophy. Left  ventricular diastolic parameters are consistent with Grade I diastolic dysfunction (impaired  relaxation). Elevated left atrial pressure.   2. Right ventricular systolic function is mildly reduced. The right ventricular size is normal. Tricuspid regurgitation signal is inadequate for assessing PA pressure.   3. Left atrial size was moderately dilated.   4. The mitral valve is normal in structure. Mild to moderate mitral valve regurgitation.   5. The aortic valve is tricuspid. There is moderate calcification of the aortic valve. There is moderate thickening of the aortic valve. Aortic valve regurgitation is not visualized. Moderate aortic valve stenosis.  Aortic valve mean gradient measures 13.3 mmHg. Aortic valve Vmax measures 2.52 m/s.    MRI -  Multifocal acute ischemia, left-greater-than-right. No hemorrhage or mass effect. This may be due to a central embolic process.  Multilevel cervical degenerative disc disease without spinal canal stenosis. 2. Severe right C5-6 neural foraminal stenosis. 3. Moderate bilateral C4-5 and left C3-4 neural foraminal stenosis. 4. Loss of normal flow void at the left V4 segment may be due to slow flow or occlusion.   MRA - 1. Loss of normal flow related enhancement within the proximal right ICA. Question prior ICA stent or other intervention. Otherwise, this could represent severe stenosis or occlusion. 2. Severe stenosis or occlusion of the V4 segment of the left vertebral artery.      Disposition Plan  :    Status is: Inpatient  DVT Prophylaxis  :    Place and maintain sequential compression device Start: 01/23/22 0549 SCDs Start: 01/23/22 0249    Lab Results  Component Value Date   PLT 251 01/23/2022    Diet :  Diet Order             Diet Heart Room service appropriate? Yes; Fluid consistency: Thin  Diet effective now                    Inpatient Medications  Scheduled Meds:  amiodarone  100 mg Oral Daily   aspirin  81  mg Oral  Daily   ezetimibe  10 mg Oral Daily   [START ON 01/25/2022] furosemide  40 mg Oral Daily   [START ON 01/25/2022] isosorbide dinitrate  10 mg Oral TID   niacin  100 mg Oral BID WC   pantoprazole  40 mg Oral Daily   sodium chloride flush  3 mL Intravenous Once   Continuous Infusions:  lactated ringers     PRN Meds:.acetaminophen **OR** acetaminophen, hydrALAZINE, nitroGLYCERIN, ondansetron **OR** ondansetron (ZOFRAN) IV, polyethylene glycol, traZODone  Antibiotics  :    Anti-infectives (From admission, onward)    None        Time Spent in minutes  30   Lala Lund M.D on 01/24/2022 at 8:36 AM  To page go to [email or web address]   [facility] -  Office  [phone]  See all Orders from today for further details    Objective:   Vitals:   01/23/22 2144 01/23/22 2326 01/24/22 0339 01/24/22 0746  BP: 100/71 (!) 182/96 118/75 130/83  Pulse: 73 77 62 64  Resp:  $Remo'17 19 12  'qFMVT$ Temp:  (!) 97.5 F (36.4 C) 98.1 F (36.7 C) 98 F (36.7 C)  TempSrc:   Oral Oral  SpO2:  100% 95% 96%  Weight:      Height:        Wt Readings from Last 3 Encounters:  01/23/22 74.4 kg  01/15/22 75.6 kg  01/07/22 71.6 kg    No intake or output data in the 24 hours ending 01/24/22 0836   Physical Exam  Awake Alert, No new F.N deficits, mild R arm weakness 5/5 [place].AT,PERRAL Supple Neck, No JVD,   Symmetrical Chest wall movement, Good air movement bilaterally, CTAB RRR,No Gallops, Rubs or new Murmurs,  +ve B.Sounds, Abd Soft, No tenderness,   No Cyanosis, Clubbing or edema       Data Review:    CBC Recent Labs  Lab 01/22/22 1709 01/22/22 1818 01/23/22 0400  WBC 8.5  --  9.2  HGB 13.7 14.6 13.9  HCT 42.6 43.0 43.3  PLT 254  --  251  MCV 92.4  --  91.5  MCH 29.7  --  29.4  MCHC 32.2  --  32.1  RDW 14.3  --  14.3  LYMPHSABS 1.5  --  2.2  MONOABS 0.8  --  0.9  EOSABS 0.0  --  0.0  BASOSABS 0.0  --  0.0    Electrolytes Recent Labs  Lab 01/22/22 1709 01/22/22 1818  01/23/22 0400 01/24/22 0627  NA 136 136 136 136  K 4.5 4.5 4.1 4.5  CL 101 103 100 101  CO2 25  --  25 23  GLUCOSE 106* 100* 96 111*  BUN 30* 31* 32* 40*  CREATININE 2.10* 2.30* 1.90* 2.22*  CALCIUM 9.8  --  9.8 9.7  AST 24  --  23  --   ALT 23  --  22  --   ALKPHOS 112  --  108  --   BILITOT 0.5  --  0.6  --   ALBUMIN 3.9  --  3.8  --   MG  --   --  2.2 2.3  INR 1.2  --   --   --   HGBA1C  --   --  5.4  --     ------------------------------------------------------------------------------------------------------------------ Recent Labs    01/23/22 0400  CHOL 306*  HDL 43  LDLCALC UNABLE TO CALCULATE IF TRIGLYCERIDE OVER 400 mg/dL  TRIG 467*  CHOLHDL  7.1  LDLDIRECT 192.8*    Lab Results  Component Value Date   HGBA1C 5.4 01/23/2022    No results for input(s): TSH, T4TOTAL, T3FREE, THYROIDAB in the last 72 hours.  Invalid input(s): FREET3 ------------------------------------------------------------------------------------------------------------------ ID Labs Recent Labs  Lab 01/22/22 1709 01/22/22 1818 01/23/22 0400 01/24/22 0627  WBC 8.5  --  9.2  --   PLT 254  --  251  --   CREATININE 2.10* 2.30* 1.90* 2.22*   Cardiac Enzymes No results for input(s): CKMB, TROPONINI, MYOGLOBIN in the last 168 hours.  Invalid input(s): CK    Radiology Reports CT HEAD WO CONTRAST  Result Date: 01/22/2022 CLINICAL DATA:  Neuro deficit, acute, stroke suspected EXAM: CT HEAD WITHOUT CONTRAST TECHNIQUE: Contiguous axial images were obtained from the base of the skull through the vertex without intravenous contrast. RADIATION DOSE REDUCTION: This exam was performed according to the departmental dose-optimization program which includes automated exposure control, adjustment of the mA and/or kV according to patient size and/or use of iterative reconstruction technique. COMPARISON:  CT head Apr 16, 2016. FINDINGS: Brain: No evidence of acute large vascular territory infarction,  hemorrhage, hydrocephalus, extra-axial collection or mass lesion/mass effect. Similar moderate patchy white matter hypoattenuation, nonspecific but compatible with chronic microvascular ischemic disease. Atrophy with ex vacuo ventricular dilation. Vascular: No hyperdense vessel identified. Skull: No acute fracture. Sinuses/Orbits: Mild paranasal sinus mucosal thickening. Unremarkable orbits. Other: No mastoid effusions. IMPRESSION: 1. No evidence of acute intracranial abnormality. 2. Chronic microvascular ischemic disease and cerebral atrophy (ICD10-G31.9). Electronically Signed   By: Margaretha Sheffield M.D.   On: 01/22/2022 17:32   MR ANGIO HEAD WO CONTRAST  Result Date: 01/22/2022 CLINICAL DATA:  Acute neurologic deficit EXAM: MRA HEAD WITHOUT CONTRAST TECHNIQUE: Angiographic images of the Circle of Willis were acquired using MRA technique without intravenous contrast. COMPARISON:  No pertinent prior exam. FINDINGS: POSTERIOR CIRCULATION: --Vertebral arteries: Loss of flow related enhancement in the distal left V4 segment. Normal right. --Inferior cerebellar arteries: Normal. --Basilar artery: Normal. --Superior cerebellar arteries: Normal. --Posterior cerebral arteries: Normal. ANTERIOR CIRCULATION: --Intracranial internal carotid arteries: Normal. --Anterior cerebral arteries (ACA): Normal. --Middle cerebral arteries (MCA): Normal. ANATOMIC VARIANTS: None IMPRESSION: 1. Loss of flow related enhancement in the distal left vertebral artery V4 segment, which may indicate occlusion or stenosis. 2. Otherwise normal intracranial MRA. Electronically Signed   By: Ulyses Jarred M.D.   On: 01/22/2022 21:24   MR ANGIO NECK WO CONTRAST  Result Date: 01/22/2022 CLINICAL DATA:  Weakness EXAM: MRA NECK WITHOUT CONTRAST TECHNIQUE: Angiographic images of the neck were acquired using MRA technique without intravenous contrast. Carotid stenosis measurements (when applicable) are obtained utilizing NASCET criteria, using the  distal internal carotid diameter as the denominator. COMPARISON:  No pertinent prior exam. FINDINGS: Aortic arch: Not visualized Right carotid system: Loss of normal flow related enhancement within the proximal right ICA. Distal right ICA is patent. Left carotid system: No occlusion or stenosis. Vertebral arteries: Right dominant. There is loss of normal flow related enhancement within the left V4 segment. The right is 1 Other: None. IMPRESSION: 1. Loss of normal flow related enhancement within the proximal right ICA. Question prior ICA stent or other intervention. Otherwise, this could represent severe stenosis or occlusion. 2. Severe stenosis or occlusion of the V4 segment of the left vertebral artery. Electronically Signed   By: Ulyses Jarred M.D.   On: 01/22/2022 21:36   MR BRAIN WO CONTRAST  Result Date: 01/22/2022 CLINICAL DATA:  Transient ischemic attack EXAM: MRI  HEAD WITHOUT CONTRAST TECHNIQUE: Multiplanar, multiecho pulse sequences of the brain and surrounding structures were obtained without intravenous contrast. COMPARISON:  None. FINDINGS: Brain: Multifocal acute ischemia, left-greater-than-right. Most of the lesions are located in the left frontal and parietal lobes. No acute or chronic hemorrhage. Hyperintense T2-weighted signal is moderately widespread throughout the white matter. Generalized volume loss without a clear lobar predilection. The midline structures are normal. Vascular: Major flow voids are preserved. Skull and upper cervical spine: Normal calvarium and skull base. Visualized upper cervical spine and soft tissues are normal. Sinuses/Orbits:No paranasal sinus fluid levels or advanced mucosal thickening. No mastoid or middle ear effusion. Normal orbits. IMPRESSION: Multifocal acute ischemia, left-greater-than-right. No hemorrhage or mass effect. This may be due to a central embolic process. Electronically Signed   By: Deatra Robinson M.D.   On: 01/22/2022 22:16   MR Cervical Spine Wo  Contrast  Result Date: 01/22/2022 CLINICAL DATA:  Cervical radiculopathy EXAM: MRI CERVICAL SPINE WITHOUT CONTRAST TECHNIQUE: Multiplanar, multisequence MR imaging of the cervical spine was performed. No intravenous contrast was administered. COMPARISON:  None. FINDINGS: Alignment: Physiologic. Vertebrae: No fracture, evidence of discitis, or bone lesion. Cord: Normal signal and morphology. Posterior Fossa, vertebral arteries, paraspinal tissues: Loss of normal flow void at the left V4 segment. Disc levels: C1-2: Unremarkable. C2-3: Small disc bulge with endplate spurring. There is no spinal canal stenosis. Mild left neural foraminal stenosis. C3-4: Small disc bulge with uncovertebral hypertrophy. There is no spinal canal stenosis. Mild right and moderate left neural foraminal stenosis. C4-5: Small disc bulge with endplate spurring. There is no spinal canal stenosis. Moderate bilateral neural foraminal stenosis. C5-6: Small disc bulge with right uncovertebral hypertrophy. There is no spinal canal stenosis. Severe right neural foraminal stenosis. C6-7: Small right asymmetric disc bulge. There is no spinal canal stenosis. Mild right neural foraminal stenosis. C7-T1: Normal disc space and facet joints. There is no spinal canal stenosis. No neural foraminal stenosis. IMPRESSION: 1. Multilevel cervical degenerative disc disease without spinal canal stenosis. 2. Severe right C5-6 neural foraminal stenosis. 3. Moderate bilateral C4-5 and left C3-4 neural foraminal stenosis. 4. Loss of normal flow void at the left V4 segment may be due to slow flow or occlusion. Electronically Signed   By: Deatra Robinson M.D.   On: 01/22/2022 21:48   VAS US CAROTID  Result Date: 01/23/2022 Carotid Arterial Duplex Study Patient Name:  Joshua Wyatt  Date of Exam:   01/23/2022 Medical Rec #: [id number]      Accession #:    [id number] Date of Birth: 1946-02-21     Patient Gender: M Patient Age:   76 years Exam Location:  [facility]  Procedure:      VAS US CAROTID Referring Phys: Bess Harvest [facility] --------------------------------------------------------------------------------  Indications:       CVA and Right stent. Risk Factors:      Hypertension, hyperlipidemia, coronary artery disease, PAD. Other Factors:     CABG x3, bilateral endarterectomies. Comparison Study:  MRA performed 01-22-2022 showed "loss of normal flow related                    enhancement within the proximal right ICA. Question prior ICA                    stent or other intervention."                     11-08-2021 Prior carotid duplex showed patent right ICA  stent, 1-39% left ICA stenosis, and bilateral ECA stenosis                    >50%. Performing Technologist: Darlin Coco RDMS, RVT  Examination Guidelines: A complete evaluation includes B-mode imaging, spectral Doppler, color Doppler, and power Doppler as needed of all accessible portions of each vessel. Bilateral testing is considered an integral part of a complete examination. Limited examinations for reoccurring indications may be performed as noted.  Right Carotid Findings: +----------+--------+--------+--------+------------------+--------+             PSV cm/s EDV cm/s Stenosis Plaque Description Comments  +----------+--------+--------+--------+------------------+--------+  CCA Prox   40       10                                             +----------+--------+--------+--------+------------------+--------+  CCA Distal 44       13                                             +----------+--------+--------+--------+------------------+--------+  ICA Prox                                                 Stent     +----------+--------+--------+--------+------------------+--------+  ICA Distal 34       12                                             +----------+--------+--------+--------+------------------+--------+  ECA        65       7                                               +----------+--------+--------+--------+------------------+--------+ +----------+--------+-------+----------------+-------------------+             PSV cm/s EDV cms Describe         Arm Pressure (mmHG)  +----------+--------+-------+----------------+-------------------+  Subclavian 80               Multiphasic, WNL                      +----------+--------+-------+----------------+-------------------+ +---------+--------+--+--------+--+---------+  Vertebral PSV cm/s 39 EDV cm/s 11 Antegrade  +---------+--------+--+--------+--+---------+  Right Stent(s): +---------------+--+--++++  Prox to Stent   44 13     +---------------+--+--++++  Proximal Stent  22 8      +---------------+--+--++++  Mid Stent       25 9      +---------------+--+--++++  Distal Stent    37 14     +---------------+--+--++++  Distal to Stent 44 17     +---------------+--+--++++   Left Carotid Findings: +----------+--------+--------+--------+------------------+--------+             PSV cm/s EDV cm/s Stenosis Plaque Description Comments  +----------+--------+--------+--------+------------------+--------+  CCA Prox   32       7                                              +----------+--------+--------+--------+------------------+--------+  CCA Distal 29       10                                             +----------+--------+--------+--------+------------------+--------+  ICA Prox   29       7                                              +----------+--------+--------+--------+------------------+--------+  ICA Distal 44       17                                             +----------+--------+--------+--------+------------------+--------+  ECA        110               >50%                                  +----------+--------+--------+--------+------------------+--------+ +----------+--------+--------+---------+-------------------+             PSV cm/s EDV cm/s Describe  Arm Pressure (mmHG)  +----------+--------+--------+---------+-------------------+   Subclavian 145               Turbulent                      +----------+--------+--------+---------+-------------------+ +---------+--------+--+--------+--+---------+  Vertebral PSV cm/s 55 EDV cm/s 13 Antegrade  +---------+--------+--+--------+--+---------+   Summary: Right Carotid: The ECA appears <50% stenosed. Patent right ICA stent. Left Carotid: Velocities in the left ICA are consistent with a 1-39% stenosis.               The ECA appears >50% stenosed. Vertebrals:  Bilateral vertebral arteries demonstrate antegrade flow. Subclavians: Left subclavian artery flow was disturbed. Normal flow hemodynamics              were seen in the right subclavian artery. *See table(s) above for measurements and observations.     Preliminary    VAS [place] TRANSCRANIAL DOPPLER (at [facility] and [facility] only)  Result Date: 01/23/2022  Transcranial Doppler Patient Name:  Joshua Wyatt  Date of Exam:   01/23/2022 Medical Rec #: [id number]      Accession #:    [id number] Date of Birth: [birth year]     Patient Gender: M Patient Age:   2 years Exam Location:  [facility] Procedure:      VAS [place] TRANSCRANIAL DOPPLER Referring Phys: Inda Merlin --------------------------------------------------------------------------------  Indications: Stroke. History: HTN, HLD, CAD s/p CABG, PAD, bilateral carotid endarterectomies, right ICA stent. Limitations for diagnostic windows: Unable to insonate right transtemporal window. Unable to insonate left transtemporal window. Comparison Study: No prior studies. Performing Technologist: Darlin Coco RDMS, RVT  Examination Guidelines: A complete evaluation includes B-mode imaging, spectral Doppler, color Doppler, and power Doppler as needed of all accessible portions of each vessel. Bilateral testing is considered an integral part of a complete examination. Limited examinations for reoccurring indications may be performed as noted.  +----------+-------------+----------+-----------+------------------+   RIGHT TCD  Right VM (cm) Depth (cm) Pulsatility      Comment        +----------+-------------+----------+-----------+------------------+  MCA                                             Unable to insonate  +----------+-------------+----------+-----------+------------------+  ACA                                             Unable to insonate  +----------+-------------+----------+-----------+------------------+  Term ICA                                        Unable to insonate  +----------+-------------+----------+-----------+------------------+  PCA                                             Unable to insonate  +----------+-------------+----------+-----------+------------------+  Opthalmic      15.00                   0.72                         +----------+-------------+----------+-----------+------------------+  ICA siphon     31.00                   1.15                         +----------+-------------+----------+-----------+------------------+  Vertebral     -30.00                   1.02                         +----------+-------------+----------+-----------+------------------+  +----------+------------+----------+-----------+------------------+  LEFT TCD   Left VM (cm) Depth (cm) Pulsatility      Comment        +----------+------------+----------+-----------+------------------+  MCA                                            Unable to insonate  +----------+------------+----------+-----------+------------------+  ACA                                            Unable to insonate  +----------+------------+----------+-----------+------------------+  Term ICA                                       Unable to insonate  +----------+------------+----------+-----------+------------------+  PCA                                            Unable to insonate  +----------+------------+----------+-----------+------------------+  Opthalmic     11.00  1.01                          +----------+------------+----------+-----------+------------------+  ICA siphon    14.00                   0.67                         +----------+------------+----------+-----------+------------------+  Vertebral     -32.00                  1.00                         +----------+------------+----------+-----------+------------------+  +------------+------+------------------+               VM cm       Comment        +------------+------+------------------+  Prox Basilar -34.00                     +------------+------+------------------+  Dist Basilar        Unable to insonate  +------------+------+------------------+    Preliminary

## 2022-01-24 NOTE — Progress Notes (Signed)
PT Cancellation Note  Patient Details Name: Joshua Wyatt MRN: [id number] DOB: 22-Nov-1946   Cancelled Treatment:    Reason Eval/Treat Not Completed: (P) Other (comment) (Pt visiting with pastor in room.) Plan to see early next day to review HEP prior to DC and safety with mobility as he is currently refusing OPPT or HHPT.   [name] 01/24/2022, 5:24 PM

## 2022-01-24 NOTE — TOC Initial Note (Signed)
Transition of Care [facility]) - Initial/Assessment Note    Patient Details  Name: Joshua Wyatt MRN: [id number] Date of Birth: [birth year]  Transition of Care [facility] CM/SW Contact:    Pollie Friar, RN Phone Number: 01/24/2022, 3:26 PM  Clinical Narrative:                 Patient is from home alone. He is currently refusing home health or outpatient therapy. Pt states he has done outpatient therapy in the past and he prefers to do the gym on his own.  Pt has a cane at home he states he uses when dizzy or lightheaded.  Pt drives self to appointments and denies issues with home medications.  Daughter will provide transport home when medically ready for discharge.   Expected Discharge Plan: Home/Self Care Barriers to Discharge: Continued Medical Work up   Patient Goals and CMS Choice        Expected Discharge Plan and Services Expected Discharge Plan: Home/Self Care   Discharge Planning Services: CM Consult   Living arrangements for the past 2 months: Single Family Home                                      Prior Living Arrangements/Services Living arrangements for the past 2 months: Single Family Home Lives with:: Self Patient language and need for interpreter reviewed:: Yes Do you feel safe going back to the place where you live?: Yes        Care giver support system in place?: No (comment) Current home services: DME (cane) Criminal Activity/Legal Involvement Pertinent to Current Situation/Hospitalization: No - Comment as needed  Activities of Daily Living      Permission Sought/Granted                  Emotional Assessment Appearance:: Appears stated age Attitude/Demeanor/Rapport: Engaged Affect (typically observed): Accepting Orientation: : Oriented to Self, Oriented to Place, Oriented to  Time, Oriented to Situation   Psych Involvement: No (comment)  Admission diagnosis:  Acute ischemic stroke [facility]) [I63.9] Cerebrovascular accident (CVA),  unspecified mechanism ([place]) [I63.9] Patient Active Problem List   Diagnosis Date Noted   Acute ischemic stroke ([place]) 01/22/2022   Anxiety 01/07/2022   AKI (acute kidney injury) ([place]) 01/07/2022   Chronic combined systolic and diastolic congestive heart failure ([place]) 01/05/2022   Bilateral lower extremity edema 11/10/2021   Paroxysmal atrial fibrillation ([place]) 11/10/2021   Aortic stenosis 11/10/2021   S/P CABG x 3    Coronary artery disease involving native coronary artery of native heart without angina pectoris 10/26/2021   S/P total knee arthroplasty, left 08/29/2021   Chest pain 05/07/2019   GERD without esophagitis 05/07/2019   H/O total knee replacement, right 05/06/2019   Preoperative clearance 12/17/2018   Status post coronary artery stent placement    CAD S/P percutaneous coronary angioplasty 03/13/2018   2nd degree AV block 02/25/2018   Status post left heart catheterization 02/24/2018   Unstable angina ([place]) 02/21/2018   PAD (peripheral artery disease) ([place]) 02/03/2017   Hyponatremia 02/03/2017   Chronic kidney disease, stage 3b ([place]) 02/03/2017   Hypokalemia 01/04/2017   Bilateral carotid artery disease ([place]) 06/17/2015   Essential hypertension 04/27/2013   PVD (peripheral vascular disease) ([place]) 03/31/2013   Severe claudication ([place]) 03/31/2013   Mixed hyperlipidemia 03/31/2013   PCP:  Janie Morning, DO Pharmacy:   [facility] [address], [place] -  [place] [place] [place] [address] Phone: [phone] Fax: [phone]  [id number] - [place], [place]. AT [address] [place] [address] Phone: [phone] Fax: [place] [address] [place] [address] Phone: [phone] Fax: [phone]     Social Determinants of Health (SDOH) Interventions    Readmission Risk Interventions No flowsheet  data found.

## 2022-01-25 LAB — CBC WITH DIFFERENTIAL/PLATELET
Abs Immature Granulocytes: 0.04 10*3/uL (ref 0.00–0.07)
Basophils Absolute: 0 10*3/uL (ref 0.0–0.1)
Basophils Relative: 0 %
Eosinophils Absolute: 0 10*3/uL (ref 0.0–0.5)
Eosinophils Relative: 0 %
HCT: 41.4 % (ref 39.0–52.0)
Hemoglobin: 13.4 g/dL (ref 13.0–17.0)
Immature Granulocytes: 1 %
Lymphocytes Relative: 22 %
Lymphs Abs: 1.7 10*3/uL (ref 0.7–4.0)
MCH: 29.8 pg (ref 26.0–34.0)
MCHC: 32.4 g/dL (ref 30.0–36.0)
MCV: 92 fL (ref 80.0–100.0)
Monocytes Absolute: 0.9 10*3/uL (ref 0.1–1.0)
Monocytes Relative: 12 %
Neutro Abs: 4.8 10*3/uL (ref 1.7–7.7)
Neutrophils Relative %: 65 %
Platelets: 209 10*3/uL (ref 150–400)
RBC: 4.5 MIL/uL (ref 4.22–5.81)
RDW: 14.2 % (ref 11.5–15.5)
WBC: 7.5 10*3/uL (ref 4.0–10.5)
nRBC: 0 % (ref 0.0–0.2)

## 2022-01-25 LAB — BASIC METABOLIC PANEL
Anion gap: 11 (ref 5–15)
BUN: 41 mg/dL — ABNORMAL HIGH (ref 8–23)
CO2: 22 mmol/L (ref 22–32)
Calcium: 9.9 mg/dL (ref 8.9–10.3)
Chloride: 100 mmol/L (ref 98–111)
Creatinine, Ser: 2.3 mg/dL — ABNORMAL HIGH (ref 0.61–1.24)
GFR, Estimated: 29 mL/min — ABNORMAL LOW (ref >60)
Glucose, Bld: 110 mg/dL — ABNORMAL HIGH (ref 70–99)
Potassium: 4.7 mmol/L (ref 3.5–5.1)
Sodium: 133 mmol/L — ABNORMAL LOW (ref 135–145)

## 2022-01-25 LAB — MAGNESIUM: Magnesium: 2.2 mg/dL (ref 1.7–2.4)

## 2022-01-25 MED ORDER — EMPAGLIFLOZIN 10 MG PO TABS: 10.0000 mg | ORAL_TABLET | Freq: Every day | ORAL | 0 refills | Status: AC

## 2022-01-25 MED ORDER — CARVEDILOL 6.25 MG PO TABS: 6.2500 mg | ORAL_TABLET | Freq: Two times a day (BID) | ORAL | 3 refills | Status: AC

## 2022-01-25 MED ORDER — NIACIN 100 MG PO TABS: 100.0000 mg | ORAL_TABLET | Freq: Two times a day (BID) | ORAL | 0 refills | Status: AC

## 2022-01-25 MED ORDER — HYDRALAZINE HCL 25 MG PO TABS: 25.0000 mg | ORAL_TABLET | Freq: Three times a day (TID) | ORAL | 6 refills | Status: AC

## 2022-01-25 NOTE — Plan of Care (Signed)
°  Problem: Education: Goal: Knowledge of secondary prevention will improve (SELECT ALL) Outcome: Adequate for Discharge Goal: Knowledge of patient specific risk factors will improve (INDIVIDUALIZE FOR PATIENT) Outcome: Adequate for Discharge   Problem: Education: Goal: Knowledge of General Education information will improve Description: Including pain rating scale, medication(s)/side effects and non-pharmacologic comfort measures Outcome: Adequate for Discharge   Problem: Health Behavior/Discharge Planning: Goal: Ability to manage health-related needs will improve Outcome: Adequate for Discharge   Problem: Clinical Measurements: Goal: Ability to maintain clinical measurements within normal limits will improve Outcome: Adequate for Discharge Goal: Will remain free from infection Outcome: Adequate for Discharge Goal: Diagnostic test results will improve Outcome: Adequate for Discharge Goal: Respiratory complications will improve Outcome: Adequate for Discharge Goal: Cardiovascular complication will be avoided Outcome: Adequate for Discharge   Problem: Activity: Goal: Risk for activity intolerance will decrease Outcome: Adequate for Discharge   Problem: Nutrition: Goal: Adequate nutrition will be maintained Outcome: Adequate for Discharge   Problem: Coping: Goal: Level of anxiety will decrease Outcome: Adequate for Discharge   Problem: Elimination: Goal: Will not experience complications related to bowel motility Outcome: Adequate for Discharge Goal: Will not experience complications related to urinary retention Outcome: Adequate for Discharge   Problem: Pain Managment: Goal: General experience of comfort will improve Outcome: Adequate for Discharge   Problem: Safety: Goal: Ability to remain free from injury will improve Outcome: Adequate for Discharge   Problem: Skin Integrity: Goal: Risk for impaired skin integrity will decrease Outcome: Adequate for Discharge

## 2022-01-25 NOTE — Discharge Instructions (Signed)
Follow with Primary MD Janie Morning, DO in 7 days, arrange for [place] for your cholesterol control   Get CBC, CMP, 2 view Chest X ray -  checked next visit within 1 week by Primary MD    Activity: As tolerated with Full fall precautions use walker/cane & assistance as needed  Disposition Home    Diet: Heart Healthy    Special Instructions: If you have smoked or chewed Tobacco  in the last 2 yrs please stop smoking, stop any regular Alcohol  and or any Recreational drug use.  On your next visit with your primary care physician please Get Medicines reviewed and adjusted.  Please request your Prim.MD to go over all Hospital Tests and Procedure/Radiological results at the follow up, please get all Hospital records sent to your Prim MD by signing hospital release before you go home.  If you experience worsening of your admission symptoms, develop shortness of breath, life threatening emergency, suicidal or homicidal thoughts you must seek medical attention immediately by calling 911 or calling your MD immediately  if symptoms less severe.  You Must read complete instructions/literature along with all the possible adverse reactions/side effects for all the Medicines you take and that have been prescribed to you. Take any new Medicines after you have completely understood and accpet all the possible adverse reactions/side effects.

## 2022-01-25 NOTE — Plan of Care (Signed)
°  Problem: Health Behavior/Discharge Planning: Goal: Ability to manage health-related needs will improve Outcome: Progressing   Problem: Clinical Measurements: Goal: Ability to maintain clinical measurements within normal limits will improve Outcome: Progressing   Problem: Clinical Measurements: Goal: Will remain free from infection Outcome: Progressing   Problem: Clinical Measurements: Goal: Diagnostic test results will improve Outcome: Progressing   Problem: Education: Goal: Knowledge of secondary prevention will improve (SELECT ALL) Outcome: Progressing   Problem: Education: Goal: Knowledge of patient specific risk factors will improve (INDIVIDUALIZE FOR PATIENT) Outcome: Progressing

## 2022-01-25 NOTE — Discharge Summary (Signed)
Joshua Wyatt:[id number] DOB: 05-09-1946 DOA: 01/22/2022  PCP: Janie Morning, DO  Admit date: 01/22/2022  Discharge date: 01/25/2022  Admitted From: Home   Disposition:  Home   Recommendations for Outpatient Follow-up:   Follow up with PCP in 1-2 weeks  PCP Please obtain BMP/CBC, 2 view CXR in 1week,  (see Discharge instructions)   PCP Please follow up on the following pending results: Monitor cholesterol panel including LDL closely.  He is already on Repatha and Zetia should be continued, niacin added.   Home Health: PT-OT if qualifies Equipment/Devices: None  Consultations: Neuro Discharge Condition: Stable    CODE STATUS: Full    Diet Recommendation: Heart Healthy   Diet Order             Diet - low sodium heart healthy           Diet Heart Room service appropriate? Yes; Fluid consistency: Thin  Diet effective now                    CVA - R.Arm weakness  Brief history of present illness from the day of admission and additional interim summary    76 year old male with past medical history of coronary artery disease (s/p DES-RCA in 2019, s/p CABG x3 10/2021), S/P bilateral carotid endarterectomies and subsequent stenting of the right coronary artery,  moderate aortic stenosis, systolic and diastolic congestive heart failure (Echo 12/2021 40-45% with G1DD), hypertension, hyperlipidemia, chronic kidney disease stage IIIb (baseline creatinine 1.8-2.2), paroxysmal atrial fibrillation on Eliquis, gastroesophageal reflux disease, hyperlipidemia who presents to [facility] emergency department with complaints of right upper extremity weakness for > 12hrs, was diagnosed with acute stroke seen by neurology and admitted to the hospital.                                                                  Hospital Course     Acute CVA ischemic versus embolic causing right arm weakness.  Seen by neurology team, imaging noted Case discussed with neurologist Dr. Leonie Man multiple times, plan is now to continue his home Eliquis and aspirin combination, continue Repatha infusions outpatient PCP and cardiologist to ensure, continue Zetia have added niacin.  Of note patient has severe statin allergy.  His right arm weakness is almost completely resolved except for some minimal subjective weakness, he will get home PT OT if he qualifies will follow with neurology, PCP and his cardiologist outpatient post discharge.    2.  Dyslipidemia with severe statin allergy.  Was on Zetia added niacin.  Outpatient Repatha infusions continue added niacin..   3.  History of CAD and chronic systolic heart failure EF 45%.  He is s/p CABG in November 2022 along with drug-eluting stent to RCA in 2019.  Currently no acute issues  and chest pain-free, continue Imdur, aspirin, low-dose beta-blocker if blood pressure stays stable.  Compensated from CHF standpoint.   4.  History of carotid endarterectomies.  Continue Eliquis, aspirin along with Zetia and niacin for secondary prevention, Repatha infusions outpatient, has severe statin allergy, carotid duplex noted discussed with Dr. Pearlean Brownie no further intervention except as above.   5.  Paroxysmal atrial fibrillation [place] vas 2 score of greater than 6.  Currently Eliquis on hold per neurology recommendation.  Blood pressure improving reintroduce blood pressure medications including beta-blocker gradually, continue aspirin for now Eliquis per stroke team.  Continue home dose amiodarone.   6.  Hypertension.  Permissive hypertension for now due to stroke.  Will gradually reintroduce his blood pressure medications over the next few days.,   7.  Mild AKI on CKD 4.  Baseline creatinine around 2.  AKI due to hypotension, resolved after hydration.   8.  GERD.  On PPI.   9.  Moderate aortic  valve stenosis.  Monitor with supportive care.  Outpatient cardiology follow-up.   Discharge diagnosis     Principal Problem:   Acute ischemic stroke [facility]) Active Problems:   Mixed hyperlipidemia   Essential hypertension   Chronic kidney disease, stage 3b (HCC)   GERD without esophagitis   Coronary artery disease involving native coronary artery of native heart without angina pectoris   Paroxysmal atrial fibrillation (HCC)   Chronic combined systolic and diastolic congestive heart failure (HCC)    Discharge instructions    Discharge Instructions     Diet - low sodium heart healthy   Complete by: As directed    Discharge instructions   Complete by: As directed    Follow with Primary MD Irena Reichmann, DO in 7 days, arrange for Repatha medicine for your cholesterol control   Get CBC, CMP, 2 view Chest X ray -  checked next visit within 1 week by Primary MD    Activity: As tolerated with Full fall precautions use walker/cane & assistance as needed  Disposition Home    Diet: Heart Healthy    Special Instructions: If you have smoked or chewed Tobacco  in the last 2 yrs please stop smoking, stop any regular Alcohol  and or any Recreational drug use.  On your next visit with your primary care physician please Get Medicines reviewed and adjusted.  Please request your Prim.MD to go over all Hospital Tests and Procedure/Radiological results at the follow up, please get all Hospital records sent to your Prim MD by signing hospital release before you go home.  If you experience worsening of your admission symptoms, develop shortness of breath, life threatening emergency, suicidal or homicidal thoughts you must seek medical attention immediately by calling 911 or calling your MD immediately  if symptoms less severe.  You Must read complete instructions/literature along with all the possible adverse reactions/side effects for all the Medicines you take and that have been prescribed to  you. Take any new Medicines after you have completely understood and accpet all the possible adverse reactions/side effects.   Increase activity slowly   Complete by: As directed        Discharge Medications   Allergies as of 01/25/2022       Reactions   Statins Swelling, Other (See Comments)   Muscle pain, also        Medication List     STOP taking these medications    polyethylene glycol 17 g packet Commonly known as: MIRALAX / GLYCOLAX  tiZANidine 2 MG tablet Commonly known as: ZANAFLEX   traZODone 50 MG tablet Commonly known as: DESYREL       TAKE these medications    acetaminophen 500 MG tablet Commonly known as: TYLENOL Take 500-1,000 mg by mouth every 6 (six) hours as needed for moderate pain (for pain).   amiodarone 200 MG tablet Commonly known as: PACERONE Take 0.5 tablets (100 mg total) by mouth daily.   apixaban 5 MG Tabs tablet Commonly known as: ELIQUIS Take 1 tablet (5 mg total) by mouth 2 (two) times daily. What changed: when to take this   aspirin EC 81 MG tablet Take 81 mg by mouth in the morning. Swallow whole.   carvedilol 6.25 MG tablet Commonly known as: COREG Take 1 tablet (6.25 mg total) by mouth 2 (two) times daily with a meal. Start taking on: January 26, 2022 What changed: when to take this   empagliflozin 10 MG Tabs tablet Commonly known as: JARDIANCE Take 1 tablet (10 mg total) by mouth daily for 14 days. Rx for free 14 day supply   ezetimibe 10 MG tablet Commonly known as: ZETIA Take 1 tablet (10 mg total) by mouth daily.   furosemide 40 MG tablet Commonly known as: LASIX Take 1 tablet (40 mg total) by mouth daily.   hydrALAZINE 25 MG tablet Commonly known as: APRESOLINE Take 1 tablet (25 mg total) by mouth every 8 (eight) hours. Start taking on: January 27, 2022 What changed: These instructions start on January 27, 2022. If you are unsure what to do until then, ask your doctor or other care provider.    isosorbide dinitrate 10 MG tablet Commonly known as: ISORDIL Take 1 tablet (10 mg total) by mouth 3 (three) times daily.   niacin 100 MG tablet Take 1 tablet (100 mg total) by mouth 2 (two) times daily with a meal.   nitroGLYCERIN 0.4 MG SL tablet Commonly known as: NITROSTAT Place 1 tablet (0.4 mg total) under the tongue every 5 (five) minutes as needed for chest pain. What changed: See the new instructions.   pantoprazole 40 MG tablet Commonly known as: PROTONIX Take 1 tablet by mouth once daily What changed:  how to take this when to take this   [place] 420 MG/3.5ML Soct Generic drug: Evolocumab with Infusor Inject 420 mg into the skin every 30 (thirty) days. On the 30th of each month         Follow-up Information     Janie Morning, DO. Schedule an appointment as soon as possible for a visit in 1 week(s).   Specialty: Family Medicine Contact information: [address] [place] [address] [phone]         Lorretta Harp, MD. Schedule an appointment as soon as possible for a visit in 1 week(s).   Specialties: Cardiology, Radiology Why: Repatha for high LDL, CVA Contact information: [address] [place] [address] [phone]                 Major procedures and Radiology Reports - PLEASE review detailed and final reports thoroughly  -      DG Chest 2 View  Result Date: 01/05/2022 CLINICAL DATA:  Chest pain EXAM: CHEST - 2 VIEW COMPARISON:  Chest x-ray 12/29/2021 FINDINGS: Cardiomegaly and mediastinum appear unchanged. Calcified plaques in the aortic arch. Cardiac surgical changes and median sternotomy wires. Pulmonary vascular congestion with no focal consolidation identified. Small linear opacity in the right upper lung  zone may represent atelectasis. No pleural effusion or pneumothorax. IMPRESSION: Cardiomegaly and pulmonary vascular congestion. Electronically Signed   By: Ofilia Neas  M.D.   On: 01/05/2022 11:50   DG Chest 2 View  Result Date: 12/29/2021 CLINICAL DATA:  Shortness of breath and dizziness. EXAM: CHEST - 2 VIEW COMPARISON:  November 28, 2021 FINDINGS: Multiple sternal wires and vascular clips are noted. The heart size and mediastinal contours are within normal limits. Both lungs are clear. The visualized skeletal structures are unremarkable. IMPRESSION: 1. Evidence of prior median sternotomy/CABG. 2. No acute cardiopulmonary disease. Electronically Signed   By: Virgina Norfolk M.D.   On: 12/29/2021 19:30   CT HEAD WO CONTRAST  Result Date: 01/22/2022 CLINICAL DATA:  Neuro deficit, acute, stroke suspected EXAM: CT HEAD WITHOUT CONTRAST TECHNIQUE: Contiguous axial images were obtained from the base of the skull through the vertex without intravenous contrast. RADIATION DOSE REDUCTION: This exam was performed according to the departmental dose-optimization program which includes automated exposure control, adjustment of the mA and/or kV according to patient size and/or use of iterative reconstruction technique. COMPARISON:  CT head Apr 16, 2016. FINDINGS: Brain: No evidence of acute large vascular territory infarction, hemorrhage, hydrocephalus, extra-axial collection or mass lesion/mass effect. Similar moderate patchy white matter hypoattenuation, nonspecific but compatible with chronic microvascular ischemic disease. Atrophy with ex vacuo ventricular dilation. Vascular: No hyperdense vessel identified. Skull: No acute fracture. Sinuses/Orbits: Mild paranasal sinus mucosal thickening. Unremarkable orbits. Other: No mastoid effusions. IMPRESSION: 1. No evidence of acute intracranial abnormality. 2. Chronic microvascular ischemic disease and cerebral atrophy (ICD10-G31.9). Electronically Signed   By: Margaretha Sheffield M.D.   On: 01/22/2022 17:32   MR ANGIO HEAD WO CONTRAST  Result Date: 01/22/2022 CLINICAL DATA:  Acute neurologic deficit EXAM: MRA HEAD WITHOUT CONTRAST  TECHNIQUE: Angiographic images of the Circle of Willis were acquired using MRA technique without intravenous contrast. COMPARISON:  No pertinent prior exam. FINDINGS: POSTERIOR CIRCULATION: --Vertebral arteries: Loss of flow related enhancement in the distal left V4 segment. Normal right. --Inferior cerebellar arteries: Normal. --Basilar artery: Normal. --Superior cerebellar arteries: Normal. --Posterior cerebral arteries: Normal. ANTERIOR CIRCULATION: --Intracranial internal carotid arteries: Normal. --Anterior cerebral arteries (ACA): Normal. --Middle cerebral arteries (MCA): Normal. ANATOMIC VARIANTS: None IMPRESSION: 1. Loss of flow related enhancement in the distal left vertebral artery V4 segment, which may indicate occlusion or stenosis. 2. Otherwise normal intracranial MRA. Electronically Signed   By: Ulyses Jarred M.D.   On: 01/22/2022 21:24   MR ANGIO NECK WO CONTRAST  Result Date: 01/22/2022 CLINICAL DATA:  Weakness EXAM: MRA NECK WITHOUT CONTRAST TECHNIQUE: Angiographic images of the neck were acquired using MRA technique without intravenous contrast. Carotid stenosis measurements (when applicable) are obtained utilizing NASCET criteria, using the distal internal carotid diameter as the denominator. COMPARISON:  No pertinent prior exam. FINDINGS: Aortic arch: Not visualized Right carotid system: Loss of normal flow related enhancement within the proximal right ICA. Distal right ICA is patent. Left carotid system: No occlusion or stenosis. Vertebral arteries: Right dominant. There is loss of normal flow related enhancement within the left V4 segment. The right is 1 Other: None. IMPRESSION: 1. Loss of normal flow related enhancement within the proximal right ICA. Question prior ICA stent or other intervention. Otherwise, this could represent severe stenosis or occlusion. 2. Severe stenosis or occlusion of the V4 segment of the left vertebral artery. Electronically Signed   By: Ulyses Jarred M.D.   On:  01/22/2022 21:36   MR BRAIN WO CONTRAST  Result Date: 01/22/2022 CLINICAL DATA:  Transient ischemic attack EXAM: MRI HEAD WITHOUT CONTRAST TECHNIQUE: Multiplanar, multiecho pulse sequences of the brain and surrounding structures were obtained without intravenous contrast. COMPARISON:  None. FINDINGS: Brain: Multifocal acute ischemia, left-greater-than-right. Most of the lesions are located in the left frontal and parietal lobes. No acute or chronic hemorrhage. Hyperintense T2-weighted signal is moderately widespread throughout the white matter. Generalized volume loss without a clear lobar predilection. The midline structures are normal. Vascular: Major flow voids are preserved. Skull and upper cervical spine: Normal calvarium and skull base. Visualized upper cervical spine and soft tissues are normal. Sinuses/Orbits:No paranasal sinus fluid levels or advanced mucosal thickening. No mastoid or middle ear effusion. Normal orbits. IMPRESSION: Multifocal acute ischemia, left-greater-than-right. No hemorrhage or mass effect. This may be due to a central embolic process. Electronically Signed   By: Ulyses Jarred M.D.   On: 01/22/2022 22:16   MR Cervical Spine Wo Contrast  Result Date: 01/22/2022 CLINICAL DATA:  Cervical radiculopathy EXAM: MRI CERVICAL SPINE WITHOUT CONTRAST TECHNIQUE: Multiplanar, multisequence MR imaging of the cervical spine was performed. No intravenous contrast was administered. COMPARISON:  None. FINDINGS: Alignment: Physiologic. Vertebrae: No fracture, evidence of discitis, or bone lesion. Cord: Normal signal and morphology. Posterior Fossa, vertebral arteries, paraspinal tissues: Loss of normal flow void at the left V4 segment. Disc levels: C1-2: Unremarkable. C2-3: Small disc bulge with endplate spurring. There is no spinal canal stenosis. Mild left neural foraminal stenosis. C3-4: Small disc bulge with uncovertebral hypertrophy. There is no spinal canal stenosis. Mild right and moderate  left neural foraminal stenosis. C4-5: Small disc bulge with endplate spurring. There is no spinal canal stenosis. Moderate bilateral neural foraminal stenosis. C5-6: Small disc bulge with right uncovertebral hypertrophy. There is no spinal canal stenosis. Severe right neural foraminal stenosis. C6-7: Small right asymmetric disc bulge. There is no spinal canal stenosis. Mild right neural foraminal stenosis. C7-T1: Normal disc space and facet joints. There is no spinal canal stenosis. No neural foraminal stenosis. IMPRESSION: 1. Multilevel cervical degenerative disc disease without spinal canal stenosis. 2. Severe right C5-6 neural foraminal stenosis. 3. Moderate bilateral C4-5 and left C3-4 neural foraminal stenosis. 4. Loss of normal flow void at the left V4 segment may be due to slow flow or occlusion. Electronically Signed   By: Ulyses Jarred M.D.   On: 01/22/2022 21:48   ECHOCARDIOGRAM COMPLETE  Result Date: 01/06/2022    ECHOCARDIOGRAM REPORT   Patient Name:   Joshua Wyatt Date of Exam: 01/06/2022 Medical Rec #:  [id number]     Height:       67.5 in Accession #:    [id number]    Weight:       161.0 lb Date of Birth:  05/18/1946    BSA:          1.854 m Patient Age:    24 years      BP:           143/68 mmHg Patient Gender: M             HR:           54 bpm. Exam Location:  Inpatient Procedure: 2D Echo, Cardiac Doppler and Color Doppler Indications:    CHF-Acute Systolic R11.65  History:        Patient has prior history of Echocardiogram examinations, most                 recent 12/06/2021. CAD; Risk Factors:Dyslipidemia and  Hypertension.  Sonographer:    Bernadene Person RDCS Referring Phys: [place]  1. Left ventricular ejection fraction, by estimation, is 40 to 45%. The left ventricle has mildly decreased function. The left ventricle demonstrates global hypokinesis. There is mild concentric left ventricular hypertrophy. Left ventricular diastolic parameters are  consistent with Grade I diastolic dysfunction (impaired relaxation). Elevated left atrial pressure.  2. Right ventricular systolic function is mildly reduced. The right ventricular size is normal. Tricuspid regurgitation signal is inadequate for assessing PA pressure.  3. Left atrial size was moderately dilated.  4. The mitral valve is normal in structure. Mild to moderate mitral valve regurgitation.  5. The aortic valve is tricuspid. There is moderate calcification of the aortic valve. There is moderate thickening of the aortic valve. Aortic valve regurgitation is not visualized. Moderate aortic valve stenosis. Aortic valve mean gradient measures 13.3 mmHg. Aortic valve Vmax measures 2.52 m/s. Comparison(s): No significant change from prior study. Prior images reviewed side by side. FINDINGS  Left Ventricle: There is profound left ventricular dyssynchrony due to LBBB. Left ventricular ejection fraction, by estimation, is 40 to 45%. The left ventricle has mildly decreased function. The left ventricle demonstrates global hypokinesis. The left ventricular internal cavity size was normal in size. There is mild concentric left ventricular hypertrophy. Left ventricular diastolic parameters are consistent with Grade I diastolic dysfunction (impaired relaxation). Elevated left atrial pressure. Right Ventricle: The right ventricular size is normal. No increase in right ventricular wall thickness. Right ventricular systolic function is mildly reduced. Tricuspid regurgitation signal is inadequate for assessing PA pressure. Left Atrium: Left atrial size was moderately dilated. Right Atrium: Right atrial size was normal in size. Pericardium: There is no evidence of pericardial effusion. Mitral Valve: The mitral valve is normal in structure. There is mild thickening of the mitral valve leaflet(s). Mild to moderate mitral valve regurgitation, with centrally-directed jet. Tricuspid Valve: The tricuspid valve is normal in  structure. Tricuspid valve regurgitation is not demonstrated. Aortic Valve: Gradients underestimate severity of aortic stenosis diue to reduced systolic function/ stroke volume. The aortic valve is tricuspid. There is moderate calcification of the aortic valve. There is moderate thickening of the aortic valve. Aortic valve regurgitation is not visualized. Moderate aortic stenosis is present. Aortic valve mean gradient measures 13.3 mmHg. Aortic valve peak gradient measures 25.4 mmHg. Aortic valve area, by VTI measures 1.23 cm. Pulmonic Valve: The pulmonic valve was grossly normal. Pulmonic valve regurgitation is trivial. Aorta: The aortic root and ascending aorta are structurally normal, with no evidence of dilitation. IAS/Shunts: No atrial level shunt detected by color flow Doppler.  LEFT VENTRICLE PLAX 2D LVIDd:         5.00 cm      Diastology LVIDs:         3.80 cm      LV e' medial:    2.81 cm/s LV PW:         1.20 cm      LV E/e' medial:  21.4 LV IVS:        1.10 cm      LV e' lateral:   3.32 cm/s LVOT diam:     2.20 cm      LV E/e' lateral: 18.1 LV SV:         70 LV SV Index:   38 LVOT Area:     3.80 cm  LV Volumes (MOD) LV vol d, MOD A2C: 95.2 ml LV vol d, MOD A4C: 134.0 ml LV  vol s, MOD A2C: 49.8 ml LV vol s, MOD A4C: 67.5 ml LV SV MOD A2C:     45.4 ml LV SV MOD A4C:     134.0 ml LV SV MOD BP:      59.4 ml RIGHT VENTRICLE RV S prime:     8.78 cm/s TAPSE (M-mode): 1.2 cm LEFT ATRIUM             Index        RIGHT ATRIUM           Index LA diam:        4.90 cm 2.64 cm/m   RA Area:     13.00 cm LA Vol (A2C):   64.3 ml 34.68 ml/m  RA Volume:   29.90 ml  16.12 ml/m LA Vol (A4C):   63.3 ml 34.14 ml/m LA Biplane Vol: 66.6 ml 35.92 ml/m  AORTIC VALVE                     PULMONIC VALVE AV Area (Vmax):    1.23 cm      PR End Diast Vel: 6.86 msec AV Area (Vmean):   1.12 cm AV Area (VTI):     1.23 cm AV Vmax:           252.00 cm/s AV Vmean:          168.000 cm/s AV VTI:            0.569 m AV Peak Grad:       25.4 mmHg AV Mean Grad:      13.3 mmHg LVOT Vmax:         81.30 cm/s LVOT Vmean:        49.500 cm/s LVOT VTI:          0.184 m LVOT/AV VTI ratio: 0.32  AORTA Ao Root diam: 3.10 cm Ao Asc diam:  3.40 cm MITRAL VALVE MV Area (PHT): 3.12 cm    SHUNTS MV Decel Time: 243 msec    Systemic VTI:  0.18 m MV E velocity: 60.20 cm/s  Systemic Diam: 2.20 cm MV A velocity: 81.30 cm/s MV E/A ratio:  0.74 Mihai Croitoru MD Electronically signed by Sanda Klein MD Signature Date/Time: 01/06/2022/1:23:31 PM    Final    VAS US CAROTID  Result Date: 01/23/2022 Carotid Arterial Duplex Study Patient Name:  BRYSUN ESCHMANN  Date of Exam:   01/23/2022 Medical Rec #: [id number]      Accession #:    [id number] Date of Birth: 06-20-1946     Patient Gender: M Patient Age:   76 years Exam [facility]  [facility] Procedure:      VAS US CAROTID Referring Phys: Deno Etienne [facility] --------------------------------------------------------------------------------  Indications:       CVA and Right stent. Risk Factors:      Hypertension, hyperlipidemia, coronary artery disease, PAD. Other Factors:     CABG x3, bilateral endarterectomies. Comparison Study:  MRA performed 01-22-2022 showed "loss of normal flow related                    enhancement within the proximal right ICA. Question prior ICA                    stent or other intervention."                     11-08-2021 Prior carotid duplex showed patent right ICA  stent, 1-39% left ICA stenosis, and bilateral ECA stenosis                    >50%. Performing Technologist: Darlin Coco RDMS, RVT  Examination Guidelines: A complete evaluation includes B-mode imaging, spectral Doppler, color Doppler, and power Doppler as needed of all accessible portions of each vessel. Bilateral testing is considered an integral part of a complete examination. Limited examinations for reoccurring indications may be performed as noted.  Right Carotid Findings:  +----------+--------+--------+--------+------------------+--------+             PSV cm/s EDV cm/s Stenosis Plaque Description Comments  +----------+--------+--------+--------+------------------+--------+  CCA Prox   40       10                                             +----------+--------+--------+--------+------------------+--------+  CCA Distal 44       13                                             +----------+--------+--------+--------+------------------+--------+  ICA Prox                                                 Stent     +----------+--------+--------+--------+------------------+--------+  ICA Distal 34       12                                             +----------+--------+--------+--------+------------------+--------+  ECA        65       7                                              +----------+--------+--------+--------+------------------+--------+ +----------+--------+-------+----------------+-------------------+             PSV cm/s EDV cms Describe         Arm Pressure (mmHG)  +----------+--------+-------+----------------+-------------------+  Subclavian 80               Multiphasic, WNL                      +----------+--------+-------+----------------+-------------------+ +---------+--------+--+--------+--+---------+  Vertebral PSV cm/s 39 EDV cm/s 11 Antegrade  +---------+--------+--+--------+--+---------+  Right Stent(s): +---------------+--+--++++  Prox to Stent   44 13     +---------------+--+--++++  Proximal Stent  22 8      +---------------+--+--++++  Mid Stent       25 9      +---------------+--+--++++  Distal Stent    37 14     +---------------+--+--++++  Distal to Stent 44 17     +---------------+--+--++++   Left Carotid Findings: +----------+--------+--------+--------+------------------+--------+             PSV cm/s EDV cm/s Stenosis Plaque Description Comments  +----------+--------+--------+--------+------------------+--------+  CCA Prox   32       7                                               +----------+--------+--------+--------+------------------+--------+  CCA Distal 29       10                                             +----------+--------+--------+--------+------------------+--------+  ICA Prox   29       7                                              +----------+--------+--------+--------+------------------+--------+  ICA Distal 44       17                                             +----------+--------+--------+--------+------------------+--------+  ECA        110               >50%                                  +----------+--------+--------+--------+------------------+--------+ +----------+--------+--------+---------+-------------------+             PSV cm/s EDV cm/s Describe  Arm Pressure (mmHG)  +----------+--------+--------+---------+-------------------+  Subclavian 145               Turbulent                      +----------+--------+--------+---------+-------------------+ +---------+--------+--+--------+--+---------+  Vertebral PSV cm/s 55 EDV cm/s 13 Antegrade  +---------+--------+--+--------+--+---------+   Summary: Right Carotid: The ECA appears <50% stenosed. Patent right ICA stent. Left Carotid: Velocities in the left ICA are consistent with a 1-39% stenosis.               The ECA appears >50% stenosed. Vertebrals:  Bilateral vertebral arteries demonstrate antegrade flow. Subclavians: Left subclavian artery flow was disturbed. Normal flow hemodynamics              were seen in the right subclavian artery. *See table(s) above for measurements and observations.     Preliminary    VAS [place] TRANSCRANIAL DOPPLER (at [facility] and [facility] only)  Result Date: 01/23/2022  Transcranial Doppler Patient Name:  LUISFELIPE ENGELSTAD  Date of Exam:   01/23/2022 Medical Rec #: [id number]      Accession #:    [id number] Date of Birth: February 23, 1946     Patient Gender: M Patient Age:   54 years Exam Location:  [facility] Procedure:      VAS [place] TRANSCRANIAL DOPPLER Referring Phys: Inda Merlin --------------------------------------------------------------------------------  Indications: Stroke. History: HTN, HLD, CAD s/p CABG, PAD, bilateral carotid endarterectomies, right ICA stent. Limitations for diagnostic windows: Unable to insonate right transtemporal window. Unable to insonate left transtemporal window. Comparison Study: No prior studies. Performing Technologist: Darlin Coco RDMS, RVT  Examination Guidelines: A complete evaluation includes B-mode imaging, spectral Doppler, color Doppler, and power Doppler as needed of all accessible portions of each vessel. Bilateral testing is considered an integral part of a complete examination. Limited examinations for reoccurring indications may be performed as noted.  +----------+-------------+----------+-----------+------------------+  RIGHT TCD  Right VM (cm) Depth (cm) Pulsatility      Comment        +----------+-------------+----------+-----------+------------------+  MCA                                             Unable to insonate  +----------+-------------+----------+-----------+------------------+  ACA                                             Unable to insonate  +----------+-------------+----------+-----------+------------------+  Term ICA                                        Unable to insonate  +----------+-------------+----------+-----------+------------------+  PCA                                             Unable to insonate  +----------+-------------+----------+-----------+------------------+  Opthalmic      15.00                   0.72                         +----------+-------------+----------+-----------+------------------+  ICA siphon     31.00                   1.15                         +----------+-------------+----------+-----------+------------------+  Vertebral     -30.00                   1.02                         +----------+-------------+----------+-----------+------------------+   +----------+------------+----------+-----------+------------------+  LEFT TCD   Left VM (cm) Depth (cm) Pulsatility      Comment        +----------+------------+----------+-----------+------------------+  MCA                                            Unable to insonate  +----------+------------+----------+-----------+------------------+  ACA                                            Unable to insonate  +----------+------------+----------+-----------+------------------+  Term ICA                                       Unable to insonate  +----------+------------+----------+-----------+------------------+  PCA                                            Unable to insonate  +----------+------------+----------+-----------+------------------+  Opthalmic     11.00  1.01                         +----------+------------+----------+-----------+------------------+  ICA siphon    14.00                   0.67                         +----------+------------+----------+-----------+------------------+  Vertebral     -32.00                  1.00                         +----------+------------+----------+-----------+------------------+  +------------+------+------------------+               VM cm       Comment        +------------+------+------------------+  Prox Basilar -34.00                     +------------+------+------------------+  Dist Basilar        Unable to insonate  +------------+------+------------------+    Preliminary      Today   Subjective    Americo Vallery today has no headache,no chest abdominal pain,no new weakness tingling or numbness, feels much better wants to go home today.    Objective   Blood pressure (!) 146/69, pulse 64, temperature 98 F (36.7 C), temperature source Oral, resp. rate 18, height $RemoveBe'5\' 7"'PQZkfWwlh$  (1.702 m), weight 74.4 kg, SpO2 98 %.   Intake/Output Summary (Last 24 hours) at 01/25/2022 0728 Last data filed at 01/25/2022 0400 Gross per 24 hour  Intake 1770.65 ml   Output 1050 ml  Net 720.65 ml    Exam  Awake Alert, No new F.N deficits, [place].AT,PERRAL Supple Neck,   Symmetrical Chest wall movement, Good air movement bilaterally, CTAB RRR,No Gallops,   +ve B.Sounds, Abd Soft, Non tender,  No Cyanosis, Clubbing or edema    Data Review   CBC w Diff:  Lab Results  Component Value Date   WBC 7.5 01/25/2022   HGB 13.4 01/25/2022   HGB 13.5 12/22/2021   HCT 41.4 01/25/2022   HCT 41.8 12/22/2021   PLT 209 01/25/2022   PLT 257 12/22/2021   LYMPHOPCT 22 01/25/2022   MONOPCT 12 01/25/2022   EOSPCT 0 01/25/2022   BASOPCT 0 01/25/2022    CMP:  Lab Results  Component Value Date   NA 133 (L) 01/25/2022   NA 137 01/15/2022   K 4.7 01/25/2022   CL 100 01/25/2022   CO2 22 01/25/2022   BUN 41 (H) 01/25/2022   BUN 34 (H) 01/15/2022   CREATININE 2.30 (H) 01/25/2022   CREATININE 1.36 (H) 02/01/2017   PROT 7.2 01/23/2022   PROT 7.0 04/25/2020   ALBUMIN 3.8 01/23/2022   ALBUMIN 4.6 04/25/2020   BILITOT 0.6 01/23/2022   BILITOT 0.6 04/25/2020   ALKPHOS 108 01/23/2022   AST 23 01/23/2022   ALT 22 01/23/2022  . Lab Results  Component Value Date   CHOL 306 (H) 01/23/2022   HDL 43 01/23/2022   LDLCALC UNABLE TO CALCULATE IF TRIGLYCERIDE OVER 400 mg/dL 01/23/2022   LDLDIRECT 192.8 (H) 01/23/2022   TRIG 467 (H) 01/23/2022   CHOLHDL 7.1 01/23/2022   Lab Results  Component Value Date   HGBA1C 5.4 01/23/2022     Total Time in preparing paper work, data evaluation and todays exam -  35 minutes  Lala Lund M.D on 01/25/2022 at 7:28 AM  [facility]

## 2022-01-25 NOTE — TOC Transition Note (Signed)
Transition of Care [facility]) - CM/SW Discharge Note   Patient Details  Name: Joshua Wyatt MRN: [id number] Date of Birth: 05/27/1946  [facility] CM/SW Contact:  Pollie Friar, RN Phone Number: 01/25/2022, 11:07 AM   Clinical Narrative:    Patient is discharging home with self care. Pt has refused home health and outpatient services. MD updated.  Pt has transportation home.    Final next level of care: Home/Self Care Barriers to Discharge: No Barriers Identified   Patient Goals and CMS Choice        Discharge Placement                       Discharge Plan and Services   Discharge Planning Services: CM Consult                                 Social Determinants of Health (SDOH) Interventions     Readmission Risk Interventions No flowsheet data found.

## 2022-01-25 NOTE — Care Management Important Message (Signed)
Important Message  Patient Details  Name: Joshua Wyatt MRN: [id number] Date of Birth: September 07, 1946   Medicare Important Message Given:  Yes     Sophia Sperry Mont[address] 01/25/2022, 3:33 PM

## 2022-01-25 NOTE — Progress Notes (Signed)
Physical Therapy Treatment Patient Details Name: Joshua Wyatt MRN: [id number] DOB: 10-21-1946 Today's Date: 01/25/2022   History of Present Illness The pt is a 76 yo male presenting 2/13 with R sided weakness.  MRI with multifocal acute ischemia L>R, per neurology embolic looking CVAs.   PMH includes: L TKA (9/22), R TKA (5/20), CAD, CKD III, HTN, HLD, afib, HFrEF, mild-moderate AS, and PAD.    PT Comments    Focus of session today functional gait, transfers, and high level balance challenges. The patient tolerated well.  Pt. Shows overall improvement with R LE function, reaching out of BOS, ability to do steps, and overall endurance . Fatigue, strength in the LE, balance, gait speed, and insight to deficits are still limiting function. Pt. Would benefit from skilled outpatient PT to continue to address these deficits and further improve function. Extensive education on energy conservation, awareness of deficits, and level of assistance needed at home was moderately accepted by the patient. Plan and discharge setting remains unchanged. Pt to follow acutely as appropriate.     Recommendations for follow up therapy are one component of a multi-disciplinary discharge planning process, led by the attending physician.  Recommendations may be updated based on patient status, additional functional criteria and insurance authorization.  Follow Up Recommendations  Outpatient PT     Assistance Recommended at Discharge Intermittent Supervision/Assistance  Patient can return home with the following A little help with walking and/or transfers;Assistance with cooking/housework;Assist for transportation;Help with stairs or ramp for entrance   Equipment Recommendations  None recommended by PT    Recommendations for Other Services       Precautions / Restrictions Precautions Precautions: Fall Restrictions Weight Bearing Restrictions: No     Mobility  Bed Mobility Overal bed mobility: Needs  Assistance             General bed mobility comments: Pt. in chair upon PT arrivial    Transfers Overall transfer level: Needs assistance Equipment used: None Transfers: Sit to/from Stand Sit to Stand: Min guard           General transfer comment: 5 x STS 13.84 sec, pt able to stand without use of UE    Ambulation/Gait Ambulation/Gait assistance: Min guard Gait Distance (Feet): 150 Feet Assistive device: None Gait Pattern/deviations: Decreased step length - right, Decreased step length - left, Decreased stride length Gait velocity: decreased     General Gait Details: Pt. needed min guard for swaying and LOB towards R side. He is able to correct and aware of these deficits. Educated on increased saftey awareness at home, increased time to do things, and quick time to fatigue of LE.   Stairs Stairs: Yes Stairs assistance: Min guard Stair Management: One rail Left Number of Stairs: 10 General stair comments: Pt. able to do step through on steps but fatigues quickly, education on step-to pattern for better tolerance.   Wheelchair Mobility    Modified Rankin (Stroke Patients Only) Modified Rankin (Stroke Patients Only) Pre-Morbid Rankin Score: No significant disability Modified Rankin: Moderately severe disability     Balance Overall balance assessment: Needs assistance Sitting-balance support: No upper extremity supported, Feet supported Sitting balance-Leahy Scale: Good Sitting balance - Comments: able to maintain balance with no sway sitting EOB with no UE support     Standing balance-Leahy Scale: Fair Standing balance comment: Pt. able to reach to ground w/o support and walks w/o assistance.             High level balance  activites: Braiding, Turns, Head turns High Level Balance Comments: He does tolerate moderate challenges with some LOB but is able to self correct. Head turns up/down were the most challeging.            Cognition  Arousal/Alertness: Awake/alert Behavior During Therapy: WFL for tasks assessed/performed Overall Cognitive Status: Within Functional Limits for tasks assessed Area of Impairment: Attention, Safety/judgement                   Current Attention Level: Selective Memory: Decreased short-term memory   Safety/Judgement: Decreased awareness of safety, Decreased awareness of deficits              Exercises      General Comments        Pertinent Vitals/Pain Pain Assessment Pain Assessment: No/denies pain    Home Living                          Prior Function            PT Goals (current goals can now be found in the care plan section) Acute Rehab PT Goals Patient Stated Goal: to go home, play golf, play/make musci PT Goal Formulation: With patient Time For Goal Achievement: 02/06/22 Potential to Achieve Goals: Good Additional Goals Additional Goal #1: 13.8 seconds Progress towards PT goals: Progressing toward goals    Frequency    Min 4X/week      PT Plan Current plan remains appropriate    Co-evaluation              AM-PAC PT "6 Clicks" Mobility   Outcome Measure  Help needed turning from your back to your side while in a flat bed without using bedrails?: None Help needed moving from lying on your back to sitting on the side of a flat bed without using bedrails?: None Help needed moving to and from a bed to a chair (including a wheelchair)?: None Help needed standing up from a chair using your arms (e.g., wheelchair or bedside chair)?: A Little Help needed to walk in hospital room?: A Little Help needed climbing 3-5 steps with a railing? : A Little 6 Click Score: 21    End of Session   Activity Tolerance: Patient tolerated treatment well;Patient limited by fatigue Patient left: in bed;with call bell/phone within reach Nurse Communication: Mobility status PT Visit Diagnosis: Other abnormalities of gait and mobility (R26.89);Muscle  weakness (generalized) (M62.81);Unsteadiness on feet (R26.81)     Time: 9629-5284 PT Time Calculation (min) (ACUTE ONLY): 27 min  Charges:  $Gait Training: 8-22 mins $Neuromuscular Re-education: 8-22 mins                     Thermon Leyland, SPT Acute Rehab Services    Thermon Leyland 01/25/2022, 10:27 AM

## 2022-01-26 ENCOUNTER — Ambulatory Visit (HOSPITAL_COMMUNITY): Payer: Medicare Other

## 2022-01-27 DIAGNOSIS — K219 Gastro-esophageal reflux disease without esophagitis: Secondary | ICD-10-CM | POA: Diagnosis not present

## 2022-01-27 DIAGNOSIS — Z7901 Long term (current) use of anticoagulants: Secondary | ICD-10-CM | POA: Diagnosis not present

## 2022-01-27 DIAGNOSIS — I739 Peripheral vascular disease, unspecified: Secondary | ICD-10-CM | POA: Diagnosis not present

## 2022-01-27 DIAGNOSIS — N183 Chronic kidney disease, stage 3 unspecified: Secondary | ICD-10-CM | POA: Diagnosis not present

## 2022-01-27 DIAGNOSIS — M519 Unspecified thoracic, thoracolumbar and lumbosacral intervertebral disc disorder: Secondary | ICD-10-CM | POA: Diagnosis not present

## 2022-01-27 DIAGNOSIS — I2510 Atherosclerotic heart disease of native coronary artery without angina pectoris: Secondary | ICD-10-CM | POA: Diagnosis not present

## 2022-01-27 DIAGNOSIS — I5043 Acute on chronic combined systolic (congestive) and diastolic (congestive) heart failure: Secondary | ICD-10-CM | POA: Diagnosis not present

## 2022-01-27 DIAGNOSIS — E785 Hyperlipidemia, unspecified: Secondary | ICD-10-CM | POA: Diagnosis not present

## 2022-01-27 DIAGNOSIS — I480 Paroxysmal atrial fibrillation: Secondary | ICD-10-CM | POA: Diagnosis not present

## 2022-01-27 DIAGNOSIS — I130 Hypertensive heart and chronic kidney disease with heart failure and stage 1 through stage 4 chronic kidney disease, or unspecified chronic kidney disease: Secondary | ICD-10-CM | POA: Diagnosis not present

## 2022-01-27 DIAGNOSIS — M19042 Primary osteoarthritis, left hand: Secondary | ICD-10-CM | POA: Diagnosis not present

## 2022-01-27 DIAGNOSIS — I350 Nonrheumatic aortic (valve) stenosis: Secondary | ICD-10-CM | POA: Diagnosis not present

## 2022-01-27 DIAGNOSIS — I701 Atherosclerosis of renal artery: Secondary | ICD-10-CM | POA: Diagnosis not present

## 2022-01-27 DIAGNOSIS — Z7982 Long term (current) use of aspirin: Secondary | ICD-10-CM | POA: Diagnosis not present

## 2022-01-27 DIAGNOSIS — I6523 Occlusion and stenosis of bilateral carotid arteries: Secondary | ICD-10-CM | POA: Diagnosis not present

## 2022-01-27 DIAGNOSIS — Z9181 History of falling: Secondary | ICD-10-CM | POA: Diagnosis not present

## 2022-01-29 ENCOUNTER — Ambulatory Visit (HOSPITAL_COMMUNITY): Payer: Medicare Other

## 2022-01-29 DIAGNOSIS — E785 Hyperlipidemia, unspecified: Secondary | ICD-10-CM | POA: Diagnosis not present

## 2022-01-29 DIAGNOSIS — M519 Unspecified thoracic, thoracolumbar and lumbosacral intervertebral disc disorder: Secondary | ICD-10-CM | POA: Diagnosis not present

## 2022-01-29 DIAGNOSIS — Z7901 Long term (current) use of anticoagulants: Secondary | ICD-10-CM | POA: Diagnosis not present

## 2022-01-29 DIAGNOSIS — I739 Peripheral vascular disease, unspecified: Secondary | ICD-10-CM | POA: Diagnosis not present

## 2022-01-29 DIAGNOSIS — I701 Atherosclerosis of renal artery: Secondary | ICD-10-CM | POA: Diagnosis not present

## 2022-01-29 DIAGNOSIS — I130 Hypertensive heart and chronic kidney disease with heart failure and stage 1 through stage 4 chronic kidney disease, or unspecified chronic kidney disease: Secondary | ICD-10-CM | POA: Diagnosis not present

## 2022-01-29 DIAGNOSIS — Z9181 History of falling: Secondary | ICD-10-CM | POA: Diagnosis not present

## 2022-01-29 DIAGNOSIS — I6523 Occlusion and stenosis of bilateral carotid arteries: Secondary | ICD-10-CM | POA: Diagnosis not present

## 2022-01-29 DIAGNOSIS — I350 Nonrheumatic aortic (valve) stenosis: Secondary | ICD-10-CM | POA: Diagnosis not present

## 2022-01-29 DIAGNOSIS — N183 Chronic kidney disease, stage 3 unspecified: Secondary | ICD-10-CM | POA: Diagnosis not present

## 2022-01-29 DIAGNOSIS — M19042 Primary osteoarthritis, left hand: Secondary | ICD-10-CM | POA: Diagnosis not present

## 2022-01-29 DIAGNOSIS — I480 Paroxysmal atrial fibrillation: Secondary | ICD-10-CM | POA: Diagnosis not present

## 2022-01-29 DIAGNOSIS — I2510 Atherosclerotic heart disease of native coronary artery without angina pectoris: Secondary | ICD-10-CM | POA: Diagnosis not present

## 2022-01-29 DIAGNOSIS — Z7982 Long term (current) use of aspirin: Secondary | ICD-10-CM | POA: Diagnosis not present

## 2022-01-29 DIAGNOSIS — K219 Gastro-esophageal reflux disease without esophagitis: Secondary | ICD-10-CM | POA: Diagnosis not present

## 2022-01-29 DIAGNOSIS — I5043 Acute on chronic combined systolic (congestive) and diastolic (congestive) heart failure: Secondary | ICD-10-CM | POA: Diagnosis not present

## 2022-01-31 ENCOUNTER — Ambulatory Visit (HOSPITAL_COMMUNITY): Payer: Medicare Other

## 2022-02-02 ENCOUNTER — Ambulatory Visit (HOSPITAL_COMMUNITY): Payer: Medicare Other

## 2022-02-05 ENCOUNTER — Ambulatory Visit (HOSPITAL_COMMUNITY): Payer: Medicare Other

## 2022-02-05 DIAGNOSIS — I739 Peripheral vascular disease, unspecified: Secondary | ICD-10-CM | POA: Diagnosis not present

## 2022-02-05 DIAGNOSIS — Z9181 History of falling: Secondary | ICD-10-CM | POA: Diagnosis not present

## 2022-02-05 DIAGNOSIS — I350 Nonrheumatic aortic (valve) stenosis: Secondary | ICD-10-CM | POA: Diagnosis not present

## 2022-02-05 DIAGNOSIS — I2510 Atherosclerotic heart disease of native coronary artery without angina pectoris: Secondary | ICD-10-CM | POA: Diagnosis not present

## 2022-02-05 DIAGNOSIS — M19042 Primary osteoarthritis, left hand: Secondary | ICD-10-CM | POA: Diagnosis not present

## 2022-02-05 DIAGNOSIS — I130 Hypertensive heart and chronic kidney disease with heart failure and stage 1 through stage 4 chronic kidney disease, or unspecified chronic kidney disease: Secondary | ICD-10-CM | POA: Diagnosis not present

## 2022-02-05 DIAGNOSIS — N183 Chronic kidney disease, stage 3 unspecified: Secondary | ICD-10-CM | POA: Diagnosis not present

## 2022-02-05 DIAGNOSIS — I480 Paroxysmal atrial fibrillation: Secondary | ICD-10-CM | POA: Diagnosis not present

## 2022-02-05 DIAGNOSIS — E785 Hyperlipidemia, unspecified: Secondary | ICD-10-CM | POA: Diagnosis not present

## 2022-02-05 DIAGNOSIS — K219 Gastro-esophageal reflux disease without esophagitis: Secondary | ICD-10-CM | POA: Diagnosis not present

## 2022-02-05 DIAGNOSIS — M519 Unspecified thoracic, thoracolumbar and lumbosacral intervertebral disc disorder: Secondary | ICD-10-CM | POA: Diagnosis not present

## 2022-02-05 DIAGNOSIS — I6523 Occlusion and stenosis of bilateral carotid arteries: Secondary | ICD-10-CM | POA: Diagnosis not present

## 2022-02-05 DIAGNOSIS — I5043 Acute on chronic combined systolic (congestive) and diastolic (congestive) heart failure: Secondary | ICD-10-CM | POA: Diagnosis not present

## 2022-02-05 DIAGNOSIS — Z7982 Long term (current) use of aspirin: Secondary | ICD-10-CM | POA: Diagnosis not present

## 2022-02-05 DIAGNOSIS — Z7901 Long term (current) use of anticoagulants: Secondary | ICD-10-CM | POA: Diagnosis not present

## 2022-02-05 DIAGNOSIS — I701 Atherosclerosis of renal artery: Secondary | ICD-10-CM | POA: Diagnosis not present

## 2022-02-06 DIAGNOSIS — E785 Hyperlipidemia, unspecified: Secondary | ICD-10-CM | POA: Diagnosis not present

## 2022-02-06 DIAGNOSIS — K219 Gastro-esophageal reflux disease without esophagitis: Secondary | ICD-10-CM | POA: Diagnosis not present

## 2022-02-06 DIAGNOSIS — M810 Age-related osteoporosis without current pathological fracture: Secondary | ICD-10-CM | POA: Diagnosis not present

## 2022-02-06 DIAGNOSIS — I10 Essential (primary) hypertension: Secondary | ICD-10-CM | POA: Diagnosis not present

## 2022-02-07 ENCOUNTER — Ambulatory Visit (HOSPITAL_COMMUNITY): Payer: Medicare Other

## 2022-02-09 ENCOUNTER — Other Ambulatory Visit: Payer: Self-pay

## 2022-02-09 ENCOUNTER — Ambulatory Visit: Payer: Medicare Other | Admitting: Cardiovascular Disease

## 2022-02-09 ENCOUNTER — Encounter: Payer: Self-pay | Admitting: Cardiovascular Disease

## 2022-02-09 ENCOUNTER — Ambulatory Visit (HOSPITAL_COMMUNITY): Payer: Medicare Other

## 2022-02-09 DIAGNOSIS — I10 Essential (primary) hypertension: Secondary | ICD-10-CM | POA: Diagnosis not present

## 2022-02-09 DIAGNOSIS — I350 Nonrheumatic aortic (valve) stenosis: Secondary | ICD-10-CM | POA: Diagnosis not present

## 2022-02-09 DIAGNOSIS — I6523 Occlusion and stenosis of bilateral carotid arteries: Secondary | ICD-10-CM | POA: Diagnosis not present

## 2022-02-09 DIAGNOSIS — I480 Paroxysmal atrial fibrillation: Secondary | ICD-10-CM

## 2022-02-09 DIAGNOSIS — E782 Mixed hyperlipidemia: Secondary | ICD-10-CM

## 2022-02-09 DIAGNOSIS — Z951 Presence of aortocoronary bypass graft: Secondary | ICD-10-CM

## 2022-02-09 NOTE — Assessment & Plan Note (Signed)
History of essential hypertension a blood pressure measured today at 140/70.  Hip he is on carvedilol, and hydralazine. ?

## 2022-02-09 NOTE — Patient Instructions (Addendum)
Medication Instructions:  ?Your physician recommends that you continue on your current medications as directed. Please refer to the Current Medication list given to you today. ? ?*If you need a refill on your cardiac medications before your next appointment, please call your pharmacy* ? ? ?Lab Work: ?Your physician recommends that you return for lab work in: 2 months for FASTING lipid/liver profile. ? ?If you have labs (blood work) drawn today and your tests are completely normal, you will receive your results only by: ?MyChart Message (if you have MyChart) OR ?A paper copy in the mail ?If you have any lab test that is abnormal or we need to change your treatment, we will call you to review the results. ? ? ?Testing/Procedures: ?Your physician has requested that you have a carotid duplex. This test is an ultrasound of the carotid arteries in your neck. It looks at blood flow through these arteries that supply the brain with blood. Allow one hour for this exam. There are no restrictions or special instructions. ? ?Your physician has requested that you have a renal artery duplex. During this test, an ultrasound is used to evaluate blood flow to the kidneys. Allow one hour for this exam. Do not eat after midnight the day before and avoid carbonated beverages. Take your medications as you usually do. ? ?Your physician has requested that you have a lower extremity arterial duplex. This test is an ultrasound of the arteries in the legs. It looks at arterial blood flow in the legs. Allow one hour for Lower Arterial scans. There are no restrictions or special instructions ? ?Your physician has requested that you have an ankle brachial index (ABI). During this test an ultrasound and blood pressure cuff are used to evaluate the arteries that supply the arms and legs with blood. Allow thirty minutes for this exam. There are no restrictions or special instructions. ?To be done in December. These procedures will be done at [place].  ? ?Your physician has requested that you have an echocardiogram. Echocardiography is a painless test that uses sound waves to create images of your heart. It provides your doctor with information about the size and shape of your heart and how well your heart?s chambers and valves are working. This procedure takes approximately one hour. There are no restrictions for this procedure. To be done in January 2024. This procedure will be done at [address] ? ? ? ?Follow-Up: ?At [facility], you and your health needs are our priority.  As part of our continuing mission to provide you with exceptional heart care, we have created designated Provider Care Teams.  These Care Teams include your primary Cardiologist (physician) and Advanced Practice Providers (APPs -  Physician Assistants and Nurse Practitioners) who all work together to provide you with the care you need, when you need it. ? ?We recommend signing up for the patient portal called "MyChart".  Sign up information is provided on this After Visit Summary.  MyChart is used to connect with patients for Virtual Visits (Telemedicine).  Patients are able to view lab/test results, encounter notes, upcoming appointments, etc.  Non-urgent messages can be sent to your provider as well.   ?To learn more about what you can do with MyChart, go to [email or web address].   ? ?Your next appointment:   ?6 month(s) ? ?The format for your next appointment:   ?In Person ? ?Provider:   ?Coletta Memos, FNP, Fabian Sharp, PA-C, Sande Rives, PA-C, Caron Presume,  PA-C, Jory Sims, DNP, ANP, or Almyra Deforest, PA-C     ? ? ?Then, [name], MD will plan to see you again in 12 month(s).  ?

## 2022-02-09 NOTE — Progress Notes (Signed)
02/09/2022 Joshua Wyatt   [birth year]  [id number]  Primary Physician Janie Morning, DO Primary Cardiologist: Lorretta Harp MD FACP, [place], [place], [place]  HPI:  Joshua Wyatt is a 77 y.o.  thin appearing widowed Caucasian male with no children who I last saw in the office 11/10/2021.  Unfortunately, his wife of 24 years died at the age of 34 on 2020/10/06.  He is currently living alone.  He has a history of PVOD status post bilateral carotid endarterectomies performed by Dr. Drucie Opitz in 2007. We have been following him by duplex ultrasound which performed most recently several days ago revealed this to be widely patent. He has also had bilateral SFA intervention as well as right renal artery stenting. His other problems include hypertension and hyperlipidemia. He did have an episode of chest heaviness recently, but more importantly he has complained of progressive lifestyle limiting claudication since I last saw him. Most recent lower extremity Dopplers reveal a right ABI of 0.93 and a left of 0.82. He does appear to have moderate iliac disease as well as high grade right SFA and popliteal stenosis. He underwent abdominal aortography with bifemoral runoff on 03/30/13 revealed a widely patent right renal artery stent, 50-60% proximal left renal artery stenosis. The left SFA was while he stayed patent and he had a 95% focal above-the-knee popliteal artery stenosis with three-vessel runoff. There was a 60% focal lesion in the right common iliac artery and a 50% segmental proximal to mid right SFA stenosis with a patent stent in the mid to distal right SFA. He also had a 50% right popliteal stenosis with three-vessel runoff. He underwent cutting balloon angioplasty of the left above-the-knee popliteal artery and Angiosculpt balloon with an excellent angiographic result and was discharged the following day. I performed angiography on him 10/11/14 and recanalized a subtotally occluded right SFA with overlapping  via Bahn covered stent performed intervention on his above-the-knee popliteal artery on that side. He had a patent mid to distal left SFA stent with high grade segmental and 6 sequential disease in the proximal SFA and distal SFA on the left. Since I saw him a year and a half ago he's had progressive lifestyle limiting claudication in his left calf. Addition, he's had severe progression of disease in his right internal carotid artery. I performed carotid stenting on him 01/03/17 with an excellent result. He underwent left lower extremity angiography by myself 02/03/17 revealing an occluded distal left SFA stent with otherwise high-grade segmental disease proximal to this and occluded anterior tibial. I performed cutting balloon atherectomy of his in-stent restenosis and the surrounding disease excellent angiographic result. His claudication has completely resolved. His left ABI has improved from 0.59 up to 1.   Because of recurrent claudication I re-angiogram him 05/11/2018 revealing a patent mid left SFA stent with high-grade segmental proximal left SFA stenosis which I performed [facility] 1 directional atherectomy followed by drug-eluting balloon angioplasty.  Claudication resolved and his Dopplers have normalized.  He did have RCA intervention by myself 03/13/2018 with a drug-eluting stent.  He currently denies chest pain, shortness of breath or claudication.  He had elective right left total knee replacement by Dr. Gladstone Lighter in June of this year which was uncomplicated.   I performed outpatient diagnostic cath on him 10/26/2021 because of accelerated chest pain revealing distal left main disease.  The following day he underwent CABG x3 by Dr. Roxan Hockey with a LIMA to his LAD, vein to obtuse marginal branch  and PDA.  He was discharged home on 11/03/2021.  He is continuing to improve.  He does have 1-2+ bilateral lower extremity edema.  His hospitalization was notable for perioperative A. fib converted to sinus rhythm on  amiodarone.  He was placed on Eliquis oral anticoagulation.  Since I saw him 3 months ago he was admitted to the hospital with a stroke on 11/21/2022, discharged 3 days later.  He had some numbness in his right hand which has since resolved.  He was evaluated by the stroke neurologist.  He remains on Eliquis oral anticoagulation.  Recent Doppler studies reveal a patent right renal artery stent with moderate left renal artery stenosis.  His most recent lower extremity Dopplers revealed normal ABIs bilaterally with a widely patent left SFA which was most recently intervened on in 2019.  He currently denies chest pain or shortness of breath.   No outpatient medications have been marked as taking for the 02/09/22 encounter (Office Visit) with Lorretta Harp, MD.     Allergies  Allergen Reactions   Statins Swelling and Other (See Comments)    Muscle pain, also    Social History   Socioeconomic History   Marital status: Widowed    Spouse name: Not on file   Number of children: Not on file   Years of education: 7   Highest education level: 7th grade  Occupational History   Occupation: Retired  Tobacco Use   Smoking status: Former    Packs/day: 1.00    Years: 58.00    Pack years: 58.00    Types: 39, Cigarettes    Quit date: 10/18/2013    Years since quitting: 8.3   Smokeless tobacco: Never  Vaping Use   Vaping Use: Never used  Substance and Sexual Activity   Alcohol use: Not Currently   Drug use: Never   Sexual activity: Yes  Other Topics Concern   Not on file  Social History Narrative   Not on file   Social Determinants of Health   Financial Resource Strain: Not on file  Food Insecurity: Not on file  Transportation Needs: Not on file  Physical Activity: Not on file  Stress: Not on file  Social Connections: Not on file  Intimate Partner Violence: Not on file     Review of Systems: General: negative for chills, fever, night sweats or weight changes.   Cardiovascular: negative for chest pain, dyspnea on exertion, edema, orthopnea, palpitations, paroxysmal nocturnal dyspnea or shortness of breath Dermatological: negative for rash Respiratory: negative for cough or wheezing Urologic: negative for hematuria Abdominal: negative for nausea, vomiting, diarrhea, bright red blood per rectum, melena, or hematemesis Neurologic: negative for visual changes, syncope, or dizziness All other systems reviewed and are otherwise negative except as noted above.    Blood pressure 140/70, pulse 64, height $RemoveBe'5\' 7"'[id number]$  (1.702 m), weight 171 lb (77.6 kg).  General appearance: alert and no distress Neck: no adenopathy, no JVD, supple, symmetrical, trachea midline, thyroid not enlarged, symmetric, no tenderness/mass/nodules, and bilateral carotid bruits Lungs: clear to auscultation bilaterally Heart: Soft outflow tract murmur Extremities: extremities normal, atraumatic, no cyanosis or edema Pulses: 2+ and symmetric Skin: Skin color, texture, turgor normal. No rashes or lesions Neurologic: Grossly normal  EKG not performed today  ASSESSMENT AND PLAN:   PVD (peripheral vascular disease) (HCC) History of peripheral arterial disease status post multiple procedures on his SFAs over the years most recently 05/11/2018 revealing a patent left SFA stent with high-grade proximal segmental left SFA  stenosis which I performed [facility] 1 directional atherectomy followed by drug-coated balloon angioplasty with excellent result.  He denies claudication.  His most recent Doppler studies reveal his left SFA to be widely patent.  Mixed hyperlipidemia History of mixed hyperlipidemia on Praluent and Zetia.  We will recheck lipid liver profile in 2 months.  Essential hypertension History of essential hypertension a blood pressure measured today at 140/70.  Hip he is on carvedilol, and hydralazine.  Bilateral carotid artery disease ([place]) History of bilateral carotid endarterectomies by  Dr. Drucie Opitz in 2007.  I performed carotid stenting on him 01/03/2017 with an excellent result.  His most recent carotid Dopplers revealed this to be widely patent.  S/P CABG x 3 History of CAD status post cardiac catheterization by myself 10/26/2021 because of accelerated angina not revealing distal left main disease.  The following day he underwent CABG x3 by Dr. Roxan Hockey with a LIMA to his LAD, vein to marginal branch and PDA.  He was discharged home on 11/03/2021.  He denies chest pain or shortness of breath.  Paroxysmal atrial fibrillation (HCC) History of PAF maintaining sinus rhythm on Eliquis oral anticoagulation.  He recently had a CVA requiring hospitalization several weeks ago but his symptoms have resolved.  He was evaluated by the stroke team.  Aortic stenosis History of moderate aortic stenosis with 2D echo performed 12/29/2021 revealing an EF of 40 to 45% with mild to moderate MR and moderate aortic stenosis.  His valve area measured 1.23 cm with a peak gradient of 25 mmHg.  We will follow this up by 2D echo on annual basis.     Lorretta Harp MD FACP,FACC,FAHA, [facility] 02/09/2022 11:32 AM

## 2022-02-09 NOTE — Assessment & Plan Note (Signed)
History of peripheral arterial disease status post multiple procedures on his SFAs over the years most recently 05/11/2018 revealing a patent left SFA stent with high-grade proximal segmental left SFA stenosis which I performed [facility] 1 directional atherectomy followed by drug-coated balloon angioplasty with excellent result.  He denies claudication.  His most recent Doppler studies reveal his left SFA to be widely patent. ?

## 2022-02-09 NOTE — Assessment & Plan Note (Signed)
History of moderate aortic stenosis with 2D echo performed 12/29/2021 revealing an EF of 40 to 45% with mild to moderate MR and moderate aortic stenosis.  His valve area measured 1.23 cm? with a peak gradient of 25 mmHg.  We will follow this up by 2D echo on annual basis. ?

## 2022-02-09 NOTE — Assessment & Plan Note (Signed)
History of bilateral carotid endarterectomies by Dr. Drucie Opitz in 2007.  I performed carotid stenting on him 01/03/2017 with an excellent result.  His most recent carotid Dopplers revealed this to be widely patent. ?

## 2022-02-09 NOTE — Assessment & Plan Note (Signed)
History of CAD status post cardiac catheterization by myself 10/26/2021 because of accelerated angina not revealing distal left main disease.  The following day he underwent CABG x3 by Dr. Roxan Hockey with a LIMA to his LAD, vein to marginal branch and PDA.  He was discharged home on 11/03/2021.  He denies chest pain or shortness of breath. ?

## 2022-02-09 NOTE — Assessment & Plan Note (Signed)
History of PAF maintaining sinus rhythm on Eliquis oral anticoagulation.  He recently had a CVA requiring hospitalization several weeks ago but his symptoms have resolved.  He was evaluated by the stroke team. ?

## 2022-02-09 NOTE — Assessment & Plan Note (Signed)
History of mixed hyperlipidemia on Praluent and Zetia.  We will recheck lipid liver profile in 2 months. ?

## 2022-02-12 ENCOUNTER — Ambulatory Visit (HOSPITAL_COMMUNITY): Payer: Medicare Other

## 2022-02-14 ENCOUNTER — Ambulatory Visit (HOSPITAL_COMMUNITY): Payer: Medicare Other

## 2022-02-16 ENCOUNTER — Ambulatory Visit (HOSPITAL_COMMUNITY): Payer: Medicare Other

## 2022-02-17 DIAGNOSIS — Z743 Need for continuous supervision: Secondary | ICD-10-CM | POA: Diagnosis not present

## 2022-02-19 ENCOUNTER — Ambulatory Visit (HOSPITAL_COMMUNITY): Payer: Medicare Other

## 2022-02-21 ENCOUNTER — Ambulatory Visit (HOSPITAL_COMMUNITY): Payer: Medicare Other

## 2022-02-23 ENCOUNTER — Ambulatory Visit (HOSPITAL_COMMUNITY): Payer: Medicare Other

## 2022-02-26 ENCOUNTER — Ambulatory Visit (HOSPITAL_COMMUNITY): Payer: Medicare Other

## 2022-02-27 ENCOUNTER — Telehealth: Payer: Self-pay | Admitting: Cardiovascular Disease

## 2022-02-27 NOTE — Telephone Encounter (Signed)
Calling to let our office know they sent over a DOB for patient needs dr berry to sign. Please advise ?

## 2022-02-28 ENCOUNTER — Ambulatory Visit (HOSPITAL_COMMUNITY): Payer: Medicare Other

## 2022-03-02 ENCOUNTER — Ambulatory Visit (HOSPITAL_COMMUNITY): Payer: Medicare Other

## 2022-03-10 DIAGNOSIS — 419620001 Death: Secondary | SNOMED CT | POA: Diagnosis not present

## 2022-03-10 DEATH — deceased

## 2022-12-31 ENCOUNTER — Other Ambulatory Visit (HOSPITAL_COMMUNITY): Payer: Self-pay
